# Patient Record
Sex: Female | Born: 1965 | Race: White | Hispanic: No | Marital: Married | Smoking: Former smoker
Health system: Southern US, Community
[De-identification: ages and names within clinical notes are randomized; demographics above are authoritative.]

## PROBLEM LIST (undated history)

## (undated) DIAGNOSIS — G5600 Carpal tunnel syndrome, unspecified upper limb: Secondary | ICD-10-CM

## (undated) DIAGNOSIS — J449 Chronic obstructive pulmonary disease, unspecified: Secondary | ICD-10-CM

## (undated) DIAGNOSIS — J45909 Unspecified asthma, uncomplicated: Secondary | ICD-10-CM

## (undated) DIAGNOSIS — Z9981 Dependence on supplemental oxygen: Secondary | ICD-10-CM

## (undated) DIAGNOSIS — F32A Depression, unspecified: Secondary | ICD-10-CM

## (undated) DIAGNOSIS — K5792 Diverticulitis of intestine, part unspecified, without perforation or abscess without bleeding: Secondary | ICD-10-CM

## (undated) DIAGNOSIS — R0902 Hypoxemia: Secondary | ICD-10-CM

## (undated) DIAGNOSIS — M1990 Unspecified osteoarthritis, unspecified site: Secondary | ICD-10-CM

## (undated) DIAGNOSIS — F419 Anxiety disorder, unspecified: Secondary | ICD-10-CM

## (undated) DIAGNOSIS — H269 Unspecified cataract: Secondary | ICD-10-CM

## (undated) DIAGNOSIS — J439 Emphysema, unspecified: Secondary | ICD-10-CM

## (undated) DIAGNOSIS — F329 Major depressive disorder, single episode, unspecified: Secondary | ICD-10-CM

## (undated) DIAGNOSIS — E785 Hyperlipidemia, unspecified: Secondary | ICD-10-CM

## (undated) DIAGNOSIS — T7840XA Allergy, unspecified, initial encounter: Secondary | ICD-10-CM

## (undated) HISTORY — DX: Unspecified osteoarthritis, unspecified site: M19.90

## (undated) HISTORY — PX: ARTHROSCOPIC REPAIR ACL: SUR80

## (undated) HISTORY — DX: Anxiety disorder, unspecified: F41.9

## (undated) HISTORY — DX: Hypoxemia: R09.02

## (undated) HISTORY — DX: Depression, unspecified: F32.A

## (undated) HISTORY — DX: Unspecified cataract: H26.9

## (undated) HISTORY — PX: APPENDECTOMY: SHX54

## (undated) HISTORY — DX: Allergy, unspecified, initial encounter: T78.40XA

## (undated) HISTORY — DX: Emphysema, unspecified: J43.9

## (undated) HISTORY — DX: Hyperlipidemia, unspecified: E78.5

## (undated) HISTORY — PX: DILATION AND CURETTAGE OF UTERUS: SHX78

---
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
## 1992-05-14 HISTORY — PX: CHOLECYSTECTOMY: SHX55

## 1998-03-20 ENCOUNTER — Emergency Department (HOSPITAL_COMMUNITY): Admission: EM | Admit: 1998-03-20 | Discharge: 1998-03-20 | Payer: Self-pay | Admitting: *Deleted

## 1998-09-16 ENCOUNTER — Encounter: Payer: Self-pay | Admitting: Family Medicine

## 1998-09-16 ENCOUNTER — Ambulatory Visit (HOSPITAL_COMMUNITY): Admission: RE | Admit: 1998-09-16 | Discharge: 1998-09-16 | Payer: Self-pay | Admitting: Family Medicine

## 1998-12-08 ENCOUNTER — Other Ambulatory Visit: Admission: RE | Admit: 1998-12-08 | Discharge: 1998-12-08 | Payer: Self-pay | Admitting: *Deleted

## 2000-09-12 ENCOUNTER — Other Ambulatory Visit: Admission: RE | Admit: 2000-09-12 | Discharge: 2000-09-12 | Payer: Self-pay | Admitting: Family Medicine

## 2001-05-16 ENCOUNTER — Encounter: Admission: RE | Admit: 2001-05-16 | Discharge: 2001-05-16 | Payer: Self-pay | Admitting: Family Medicine

## 2001-05-16 ENCOUNTER — Encounter: Payer: Self-pay | Admitting: Family Medicine

## 2001-11-08 ENCOUNTER — Encounter: Payer: Self-pay | Admitting: Internal Medicine

## 2001-11-08 ENCOUNTER — Encounter: Admission: RE | Admit: 2001-11-08 | Discharge: 2001-11-08 | Payer: Self-pay | Admitting: Internal Medicine

## 2002-05-14 HISTORY — PX: TUBAL LIGATION: SHX77

## 2002-08-24 ENCOUNTER — Ambulatory Visit (HOSPITAL_COMMUNITY): Admission: RE | Admit: 2002-08-24 | Discharge: 2002-08-24 | Payer: Self-pay | Admitting: Obstetrics and Gynecology

## 2002-08-24 ENCOUNTER — Encounter: Payer: Self-pay | Admitting: Obstetrics and Gynecology

## 2002-09-02 ENCOUNTER — Emergency Department (HOSPITAL_COMMUNITY): Admission: EM | Admit: 2002-09-02 | Discharge: 2002-09-02 | Payer: Self-pay | Admitting: Emergency Medicine

## 2002-09-10 ENCOUNTER — Ambulatory Visit (HOSPITAL_COMMUNITY): Admission: RE | Admit: 2002-09-10 | Discharge: 2002-09-10 | Payer: Self-pay | Admitting: Obstetrics and Gynecology

## 2004-05-14 HISTORY — PX: BUNIONECTOMY: SHX129

## 2005-01-30 ENCOUNTER — Other Ambulatory Visit: Admission: RE | Admit: 2005-01-30 | Discharge: 2005-01-30 | Payer: Self-pay | Admitting: Family Medicine

## 2005-02-20 ENCOUNTER — Encounter: Admission: RE | Admit: 2005-02-20 | Discharge: 2005-02-20 | Payer: Self-pay | Admitting: Family Medicine

## 2005-09-29 ENCOUNTER — Emergency Department (HOSPITAL_COMMUNITY): Admission: EM | Admit: 2005-09-29 | Discharge: 2005-09-29 | Payer: Self-pay | Admitting: Family Medicine

## 2005-10-01 ENCOUNTER — Emergency Department (HOSPITAL_COMMUNITY): Admission: EM | Admit: 2005-10-01 | Discharge: 2005-10-01 | Payer: Self-pay | Admitting: Emergency Medicine

## 2006-03-14 ENCOUNTER — Encounter: Admission: RE | Admit: 2006-03-14 | Discharge: 2006-03-14 | Payer: Self-pay | Admitting: Family Medicine

## 2006-03-27 ENCOUNTER — Encounter: Admission: RE | Admit: 2006-03-27 | Discharge: 2006-03-27 | Payer: Self-pay | Admitting: Family Medicine

## 2006-05-26 ENCOUNTER — Emergency Department (HOSPITAL_COMMUNITY): Admission: EM | Admit: 2006-05-26 | Discharge: 2006-05-26 | Payer: Self-pay | Admitting: Emergency Medicine

## 2006-10-21 ENCOUNTER — Emergency Department (HOSPITAL_COMMUNITY): Admission: EM | Admit: 2006-10-21 | Discharge: 2006-10-21 | Payer: Self-pay | Admitting: Emergency Medicine

## 2007-01-29 ENCOUNTER — Emergency Department (HOSPITAL_COMMUNITY): Admission: EM | Admit: 2007-01-29 | Discharge: 2007-01-29 | Payer: Self-pay | Admitting: Emergency Medicine

## 2007-04-11 ENCOUNTER — Encounter: Admission: RE | Admit: 2007-04-11 | Discharge: 2007-04-11 | Payer: Self-pay | Admitting: Family Medicine

## 2007-05-15 HISTORY — PX: ABDOMINAL HYSTERECTOMY: SHX81

## 2007-09-08 ENCOUNTER — Other Ambulatory Visit: Admission: RE | Admit: 2007-09-08 | Discharge: 2007-09-08 | Payer: Self-pay | Admitting: Family Medicine

## 2008-04-19 ENCOUNTER — Encounter: Admission: RE | Admit: 2008-04-19 | Discharge: 2008-04-19 | Payer: Self-pay | Admitting: Family Medicine

## 2008-08-06 ENCOUNTER — Encounter (INDEPENDENT_AMBULATORY_CARE_PROVIDER_SITE_OTHER): Payer: Self-pay | Admitting: Obstetrics and Gynecology

## 2008-08-06 ENCOUNTER — Ambulatory Visit (HOSPITAL_COMMUNITY): Admission: RE | Admit: 2008-08-06 | Discharge: 2008-08-07 | Payer: Self-pay | Admitting: Obstetrics and Gynecology

## 2009-05-09 ENCOUNTER — Encounter: Admission: RE | Admit: 2009-05-09 | Discharge: 2009-05-09 | Payer: Self-pay | Admitting: Family Medicine

## 2009-05-14 HISTORY — PX: ARTHROSCOPIC REPAIR ACL: SUR80

## 2009-05-17 ENCOUNTER — Encounter: Admission: RE | Admit: 2009-05-17 | Discharge: 2009-05-17 | Payer: Self-pay | Admitting: Family Medicine

## 2009-08-30 ENCOUNTER — Ambulatory Visit (HOSPITAL_BASED_OUTPATIENT_CLINIC_OR_DEPARTMENT_OTHER): Admission: RE | Admit: 2009-08-30 | Discharge: 2009-08-30 | Payer: Self-pay | Admitting: Orthopedic Surgery

## 2010-06-01 ENCOUNTER — Encounter
Admission: RE | Admit: 2010-06-01 | Discharge: 2010-06-01 | Payer: Self-pay | Source: Home / Self Care | Attending: Family Medicine | Admitting: Family Medicine

## 2010-06-03 ENCOUNTER — Encounter: Payer: Self-pay | Admitting: Family Medicine

## 2010-06-04 ENCOUNTER — Encounter: Payer: Self-pay | Admitting: Family Medicine

## 2010-08-24 LAB — CBC
HCT: 40.2 % (ref 36.0–46.0)
Hemoglobin: 12.3 g/dL (ref 12.0–15.0)
Hemoglobin: 13.7 g/dL (ref 12.0–15.0)
MCHC: 34.1 g/dL (ref 30.0–36.0)
MCV: 96.7 fL (ref 78.0–100.0)
MCV: 97.6 fL (ref 78.0–100.0)
Platelets: 271 10*3/uL (ref 150–400)
RDW: 12.4 % (ref 11.5–15.5)
RDW: 12.6 % (ref 11.5–15.5)

## 2010-08-24 LAB — BASIC METABOLIC PANEL
BUN: 9 mg/dL (ref 6–23)
Chloride: 98 mEq/L (ref 96–112)
Glucose, Bld: 87 mg/dL (ref 70–99)
Potassium: 3.5 mEq/L (ref 3.5–5.1)

## 2010-09-26 NOTE — Op Note (Signed)
NAME:  Vicki [name], Vicki [name]           ACCOUNT NO.:  [id number]   MEDICAL RECORD NO.:  [id number]          PATIENT TYPE:  AMB   LOCATION:  SDC                           FACILITY:  [facility]   PHYSICIAN:  Zenaida Niece, M.D.DATE OF BIRTH:  November 29, 1965   DATE OF PROCEDURE:  08/06/2008  DATE OF DISCHARGE:                               OPERATIVE REPORT   PREOPERATIVE DIAGNOSES:  Chronic pelvic pain and abnormal uterine  bleeding.   POSTOPERATIVE DIAGNOSES:  Chronic pelvic pain and abnormal uterine  bleeding.   PROCEDURE:  Laparoscopic-assisted vaginal hysterectomy and cystoscopy.   SURGEON:  Zenaida Niece, MD   ASSISTANT:  Malachi Pro. Ambrose Mantle, MD   ANESTHESIA:  General endotracheal tube.   FINDINGS:  She had normal pelvic anatomy with evidence of prior tubal  ligation and normal abdomen.   SPECIMENS:  Uterus with cervix sent for routine pathology.   ESTIMATED BLOOD LOSS:  300 mL.   COMPLICATIONS:  None.   PROCEDURE IN DETAIL:  The patient was taken to the operating room and  placed in the dorsal supine position.  General anesthesia was induced.  Both arms were tucked to her sides and legs were placed in mobile  stirrups.  Abdomen, perineum, and vagina were then prepped and draped in  the usual sterile fashion, bladder drained with a red Robinson catheter.  Infraumbilical skin was then infiltrated with 0.25% Marcaine and a 0.75  cm vertical incision was made.  The Veress needle was inserted into the  peritoneal cavity and placement confirmed by the water drop test.  Initially, I had difficulty obtaining a good pressure with CO2, but  eventually I was able to get a good opening pressure of 6 mmHg.  CO2 gas  was insufflated to a pressure of 12 mmHg and the Veress needle was  removed.  A 5-mm trocar was then introduced with direct visualization  with the laparoscope.  The 5-mm ports were then placed on each side also  under direct visualization.  Inspection of the abdomen revealed a  normal  upper abdomen, liver edge, and appendix.  Pelvis had normal anatomy.  Uterus was mid planar to retroverted normal size.  Tubes and ovaries  were normal except for evidence of prior tubal ligation.  The uterine  fundus was grasped with a 5-mm tenaculum.  Using the harmonic scalpel  Ace first on the left side, the proximal fallopian tube, utero-ovarian  pedicle, and broad ligament were taken down.  Once I got close to the  uterine artery, there was bleeding and this was eventually controlled  with the harmonic scalpel with some difficulty.  The anterior peritoneum  was then incised across the anterior portion uterus to push the bladder  inferior.  Sides were switched.  The harmonic scalpel Ace was used on  the right side similarly to take down the mesosalpinx, round ligament  utero-ovarian ligament, and broad ligament.  Anterior peritoneum was  incised across the anterior portion of the uterus to connect the  incision coming from the left side and the bladder was pushed inferior.  The uterine artery was left intact.  At this point,  I elected to proceed  vaginally.   Legs were elevated in the stirrups.  A weighted speculum was inserted  into the vagina.  The cervix was grasped with Christella Hartigan tenaculums.  The  cervicovaginal mucosa was then infiltrated with a dilute solution of  Pitressin and incised circumferentially with electrocautery.  Sharp  dissection was then used to further free the vagina from the cervix.  Anteriorly, I was able to enter the anterior peritoneum and place a  Deaver retractor to retract the bladder anterior.  Posterior cul-de-sac  was entered and a Bonnano speculum placed in the posterior cul-de-sac.  Uterosacral ligaments were then clamped, transected, and ligated with #1  chromic and tagged for later use.  Uterine arteries and cardinal  ligaments were clamped, transected, and ligated with #1 chromic.  This  freed up the left side of the uterus.  One further  bite on the right  side of mostly broad ligament, freed the uterus on this side.  This  pedicle was secured with #1 chromic.  No significant bleeding was noted.  The Bonnano speculum was removed and a shorter speculum was placed.  The  uterosacral ligaments were plicated in midline with 2-0 silk.  The  previously tagged uterosacral pedicles were also tied in the midline.  Vagina was then closed in a vertical fashion with running locking 2-0  Vicryl with adequate closure and adequate hemostasis.   The patient was given indigo carmine IV.  A 70-degree cystoscope was  inserted and good visualization was achieved with approximately 200 mL  of sterile fluid.  The bladder appeared normal.  Both ureteral orifices  were easily identified and had good efflux of blue-tinged urine.  The  cystoscope was removed and a Foley catheter was placed.  The legs were  then lowered in the stirrups.   Dr. Ambrose Mantle, myself, and the scrub techs changed gloves.  The abdomen was  reinsufflated with CO2 gas and the laparoscope was reinserted.  The  pelvis was inspected and irrigated and no significant bleeding was  noted.  All pedicles were hemostatic.  All trocars were removed with  direct visualization.  All gas was allowed to deflate from the abdomen.  Skin incisions were closed with interrupted subcuticular sutures of 4-0  Vicryl followed by Dermabond.  The patient tolerated the procedure well.  She was extubated in the operating room and taken to the recovery room  in stable condition.  Counts were correct x2, she received clindamycin  and gentamicin at the beginning of the procedure due to a PENICILLIN  allergy and had PAS hose on throughout the procedure.      Zenaida Niece, M.D.  Electronically Signed     TDM/MEDQ  D:  08/06/2008  T:  08/07/2008  Job:  [id number]

## 2010-09-26 NOTE — H&P (Signed)
NAME:  Vicki [name], Vicki [name]           ACCOUNT NO.:  [id number]   MEDICAL RECORD NO.:  [id number]          PATIENT TYPE:  AMB   LOCATION:  SDC                           FACILITY:  [facility]   PHYSICIAN:  Zenaida Niece, M.D.DATE OF BIRTH:  02/05/1966   DATE OF ADMISSION:  08/06/2008  DATE OF DISCHARGE:                              HISTORY & PHYSICAL   CHIEF COMPLAINT:  Chronic pelvic pain and abnormal uterine bleeding.   HISTORY OF PRESENT ILLNESS:  This is a 45 year old female, para 3-1-0-3,  whom I saw for an annual exam in March of this year.  At that time she  was complaining of irregular periods every 2-4 weeks with intermittent  pelvic pain for up to a week.  She also complains of occasional pain  with intercourse.  Physical exam revealed a benign abdomen except she  was tender in both lower quadrants.  Pelvic exam revealed a mid planar  uterus that was normal size and slightly tender.  She was tender in the  vaginal fornices and tender in bilateral adnexa.  All nonsurgical and  surgical options were discussed with the patient regarding her pelvic  pain and her abnormal uterine bleeding.  The patient wishes to proceed  with definitive surgical therapy and is admitted for laparoscopic-  assisted vaginal hysterectomy at this time.   PAST OB HISTORY:  Three vaginal deliveries at term and one intrauterine  fetal demise at 25 weeks.   PAST MEDICAL HISTORY:  COPD.   PAST SURGICAL HISTORY:  1. Cholecystectomy.  2. She has had two laparoscopic tubal fulgurations, the first one      1998.  She then had a positive pregnancy test in 2004 and in 2004      underwent a repeat laparoscopy with tubal fulguration.  3. Bunion surgery x2.   ALLERGIES:  PENICILLIN and CEPHALOSPORINS, possibly to  ALEVE  and does  not tolerate DEPO-PROVERA. Marland Kitchen   CURRENT MEDICATIONS:  Advair and Xanax b.i.d.   SOCIAL HISTORY:  The patient is married and does smoke a pack of  cigarettes a day.  She denies alcohol  or drug use.   FAMILY HISTORY:  The patient has a sister with breast cancer.   GYN HISTORY:  No history of abnormal Pap smears.   REVIEW OF SYSTEMS:  Again some urinary leaking with straining and some  loose stools.   PHYSICAL EXAMINATION:  GENERAL:  This is a well-developed female in no  acute distress.  VITAL SIGNS:  Weight is 157 pounds, blood pressure is 140/90.  NECK:  Supple without lymphadenopathy or thyromegaly.  LUNGS:  Clear to auscultation although decreased breath sounds  bilaterally.  HEART:  Regular rate and rhythm without murmur.  ABDOMEN:  Soft, nondistended without palpable mass and she is slightly  tender in both lower quadrants.  EXTREMITIES:  No edema and are  nontender.  PELVIC:  On pelvic exam, external genitalia has no lesions.  On speculum  exam, the cervix is normal.  On bimanual exam she has a mid planar to  anteverted, slightly tender uterus.  Vaginal fornices are tender and she  is tender  on in both adnexa without mass.  This is exam is confirmed by  rectovaginal exam.   ASSESSMENT:  Pelvic pain and abnormal uterine bleeding.  All nonsurgical  and surgical options were discussed with the patient.  She made a  specific appointment with her husband to review these options.  They  have elected to proceed with definitive surgical therapy.  Again all  surgical options and all risks of surgery have been discussed.  Plan is  to admit the patient on the day of surgery for laparoscopic-assisted  vaginal hysterectomy.  We will preserve her ovaries unless they look  abnormal.      Zenaida Niece, M.D.  Electronically Signed     TDM/MEDQ  D:  08/05/2008  T:  08/05/2008  Job:  [id number]

## 2010-09-29 NOTE — Op Note (Signed)
NAME:  Vicki [name], Vicki [name]                     ACCOUNT NO.:  [id number]   MEDICAL RECORD NO.:  [id number]                   PATIENT TYPE:  AMB   LOCATION:  DAY                                  FACILITY:  [facility]   PHYSICIAN:  Zenaida Niece, M.D.             DATE OF BIRTH:  08-27-1965   DATE OF PROCEDURE:  09/10/2002  DATE OF DISCHARGE:                                 OPERATIVE REPORT   PREOPERATIVE DIAGNOSES:  Desires surgical sterility and prior tubal  ligation.   POSTOPERATIVE DIAGNOSES:  Desires surgical sterility and prior tubal  ligation.   PROCEDURE:  Laparoscopic bilateral tubal fulguration.   SURGEON:  Zenaida Niece, M.D.   ANESTHESIA:  General endotracheal tube.   ESTIMATED BLOOD LOSS:  Less than 50 mL.   FINDINGS:  Normal abdominal and pelvic anatomy except for the prior tubal  ligation. The right tube was kind of curled up around itself and the two  ends were in fairly close proximity. The left tube appeared to be adequately  separated.   DESCRIPTION OF PROCEDURE:  The patient was taken to the operating room and  placed in the dorsal supine position. General anesthesia was induced and she  was placed in mobile stirrups. The abdomen was prepped and draped in the  usual sterile fashion, bladder drained with a red rubber catheter, Hulka  tenaculum applied to her cervix for uterine manipulation. Infraumbilical  skin was infiltrated with 0.25% Marcaine. A 1 1/2 cm vertical incision was  made in her previous scar and the Veress needle was inserted into the  peritoneal cavity. This was confirmed by the water drop test and an opening  pressure of 1 mmHg. The abdomen was then insufflated to a pressure of 12  mmHg and the Veress needle was removed. The 10/11 disposable trocar was then  introduced and placement confirmed by the laparoscope. Again inspection  revealed a normal uterus. The right tube did show evidence of prior tubal  ligation. However, the proximal  portion seen to curl around itself and come  into a fairly proximate position to the distal segment of the tube. Several  segments of each end of the tube were fulgurated with bipolar cautery to  achieve sterilization. This was done with adequate hemostasis. The left tube  appeared to be separated by a good 1-2 cm. However, both the proximal and  distal ends of the tube were fulgurated with bipolar cautery to help usher  sterilization. There did appear to be a spot of endometriosis at the  proximal portion of the left tube. This was fulgurated with the tube. All  sites were hemostatic. The laparoscope was removed and all gas to deflate  from the abdomen. The disposable trocar was removed. The skin incision was  closed interrupted subcuticular sutures of 4-0 Vicryl followed by Steri-  Strips and band aids. The patient was extubated in the operating room,  tolerated the procedure well and was  taken to the recovery room in stable  condition. Counts were correct and she was given no antibiotics.                                               Zenaida Niece, M.D.    TDM/MEDQ  D:  09/10/2002  T:  09/10/2002  Job:  [id number]

## 2011-06-18 ENCOUNTER — Other Ambulatory Visit: Payer: Self-pay | Admitting: Family Medicine

## 2011-06-18 DIAGNOSIS — Z1231 Encounter for screening mammogram for malignant neoplasm of breast: Secondary | ICD-10-CM

## 2011-07-02 ENCOUNTER — Ambulatory Visit: Payer: Self-pay

## 2011-07-04 ENCOUNTER — Ambulatory Visit
Admission: RE | Admit: 2011-07-04 | Discharge: 2011-07-04 | Disposition: A | Discharge status: Home / Self Care | Payer: BC Managed Care – PPO | Source: Ambulatory Visit | Attending: Family Medicine | Admitting: Family Medicine

## 2011-07-04 DIAGNOSIS — Z1231 Encounter for screening mammogram for malignant neoplasm of breast: Secondary | ICD-10-CM

## 2012-06-03 ENCOUNTER — Other Ambulatory Visit: Payer: Self-pay | Admitting: Family Medicine

## 2012-06-03 DIAGNOSIS — Z803 Family history of malignant neoplasm of breast: Secondary | ICD-10-CM

## 2012-06-03 DIAGNOSIS — Z1231 Encounter for screening mammogram for malignant neoplasm of breast: Secondary | ICD-10-CM

## 2012-07-04 ENCOUNTER — Ambulatory Visit: Payer: BC Managed Care – PPO

## 2012-07-07 ENCOUNTER — Ambulatory Visit
Admission: RE | Admit: 2012-07-07 | Discharge: 2012-07-07 | Disposition: A | Discharge status: Home / Self Care | Payer: BC Managed Care – PPO | Source: Ambulatory Visit | Attending: Family Medicine | Admitting: Family Medicine

## 2012-07-07 DIAGNOSIS — Z803 Family history of malignant neoplasm of breast: Secondary | ICD-10-CM

## 2013-02-12 ENCOUNTER — Emergency Department (INDEPENDENT_AMBULATORY_CARE_PROVIDER_SITE_OTHER): Payer: BC Managed Care – PPO

## 2013-02-12 ENCOUNTER — Encounter (HOSPITAL_COMMUNITY): Payer: Self-pay | Admitting: Emergency Medicine

## 2013-02-12 ENCOUNTER — Emergency Department (HOSPITAL_COMMUNITY)
Admission: EM | Admit: 2013-02-12 | Discharge: 2013-02-12 | Disposition: A | Discharge status: Home / Self Care | Payer: BC Managed Care – PPO | Source: Home / Self Care | Attending: Emergency Medicine | Admitting: Emergency Medicine

## 2013-02-12 DIAGNOSIS — S2239XA Fracture of one rib, unspecified side, initial encounter for closed fracture: Secondary | ICD-10-CM

## 2013-02-12 DIAGNOSIS — S2232XA Fracture of one rib, left side, initial encounter for closed fracture: Secondary | ICD-10-CM

## 2013-02-12 HISTORY — DX: Chronic obstructive pulmonary disease, unspecified: J44.9

## 2013-02-12 MED ORDER — HYDROCODONE-ACETAMINOPHEN 5-325 MG PO TABS: 1.0000 | ORAL_TABLET | ORAL | Status: DC | PRN

## 2013-02-12 NOTE — ED Provider Notes (Signed)
Medical screening examination/treatment/procedure(s) were performed by non-physician practitioner and as supervising physician I was immediately available for consultation/collaboration.  Leslee Home, M.D.  Reuben Likes, MD 02/12/13 [phone]

## 2013-02-12 NOTE — ED Notes (Signed)
Left rib pain onset Sunday.  Reports her husband was trying to pop her back, while lying on the floor, her husband stood on her back and felt pop "explosion" in left chest.

## 2013-02-12 NOTE — ED Provider Notes (Signed)
CSN: [id number]     Arrival date & time 02/12/13  0802 History   None    Chief Complaint  Patient presents with  . Chest Pain   (Consider location/radiation/quality/duration/timing/severity/associated sxs/prior Treatment) Patient is a 47 y.o. female presenting with chest pain. The history is provided by the patient.  Chest Pain Pain location:  L chest Pain quality: aching   Pain radiates to:  Does not radiate Pain radiates to the back: no   Pain severity:  Severe Onset quality:  Sudden Duration:  4 days Timing:  Constant Progression:  Unchanged Chronicity:  New Context: trauma   Context comment:  Pt was lying on floor, husband was standing on back trying to crack her back when pt felt an "explosion" in L ribs and heard a pop Relieved by:  Nothing Worsened by:  Nothing tried Ineffective treatments: ibuprofen. Associated symptoms: no cough, no diaphoresis, no fever, no palpitations and no shortness of breath   Risk factors: smoking     Past Medical History  Diagnosis Date  . COPD (chronic obstructive pulmonary disease)    Past Surgical History  Procedure Laterality Date  . Abdominal hysterectomy    . Bunionectomy    . Arthroscopic repair acl Left     x 2   History reviewed. No pertinent family history. History  Substance Use Topics  . Smoking status: Current Every Day Smoker  . Smokeless tobacco: Not on file  . Alcohol Use: No   OB History   Grav Para Term Preterm Abortions TAB SAB Ect Mult Living                 Review of Systems  Constitutional: Negative for fever and diaphoresis.  Respiratory: Negative for cough and shortness of breath.   Cardiovascular: Positive for chest pain. Negative for palpitations.  Musculoskeletal:       L anterior rib pain    Allergies  Aleve; Benadryl; Ceftin; Depo-provera; Jasmine oil; Naproxen; Oxycodone; Penicillins; and Tramadol  Home Medications   Current Outpatient Rx  Name  Route  Sig  Dispense  Refill  . ALPRAZolam  (XANAX) 1 MG tablet   Oral   Take 1 mg by mouth at bedtime as needed for sleep.         Marland Kitchen aspirin 81 MG tablet   Oral   Take 81 mg by mouth daily.         . Fluticasone-Salmeterol (ADVAIR HFA IN)   Inhalation   Inhale into the lungs.         Marland Kitchen HYDROcodone-acetaminophen (NORCO/VICODIN) 5-325 MG per tablet   Oral   Take 1-2 tablets by mouth every 4 (four) hours as needed for pain.   30 tablet   0    BP 124/86  Pulse 92  Temp(Src) 97.7 F (36.5 C) (Oral)  Resp 16  SpO2 99% Physical Exam  Constitutional: She appears well-developed and well-nourished.  Appears in pain  Cardiovascular: Normal rate and regular rhythm.   Pulmonary/Chest: Effort normal and breath sounds normal. She exhibits tenderness and bony tenderness.      ED Course  Procedures (including critical care time) Labs Review Labs Reviewed - No data to display Imaging Review Dg Ribs Unilateral W/chest Left  02/12/2013   CLINICAL DATA:  Pain  EXAM: LEFT RIBS AND CHEST - 3+ VIEW  COMPARISON:  Chest radiograph October 21, 2006  FINDINGS: Frontal chest as well as bilateral oblique views were obtained. Lungs are clear although mildly hyperexpanded. Heart size and pulmonary  vascularity are normal. There is no demonstrable effusion or pneumothorax.  There are acute appearing fractures of the anterior left 6th and 7th ribs which are nondisplaced. No other acute fractures are identified. There is an old healed fracture of the posterior right 6th rib.  IMPRESSION: The lungs are hyperexpanded but clear. There are fractures of the anterior left 6th and 7th ribs, nondisplaced. Old fracture of the right posterior 6th rib noted. No pneumothorax.   Electronically Signed   By: Bretta Bang   On: 02/12/2013 09:53    MDM   1. Rib fractures, left, closed, initial encounter    Rx hydrocodone/apap 5/325 1-2 po q4 hours prn pain #30. Reviewed importance of deep breathing, use of splinting. Continue ibuprofen at home.      Cathlyn Parsons, NP 02/12/13 1020

## 2013-06-19 ENCOUNTER — Other Ambulatory Visit: Payer: Self-pay

## 2013-06-19 DIAGNOSIS — Z1231 Encounter for screening mammogram for malignant neoplasm of breast: Secondary | ICD-10-CM

## 2013-06-19 DIAGNOSIS — Z803 Family history of malignant neoplasm of breast: Secondary | ICD-10-CM

## 2013-07-13 ENCOUNTER — Ambulatory Visit
Admission: RE | Admit: 2013-07-13 | Discharge: 2013-07-13 | Disposition: A | Discharge status: Home / Self Care | Payer: Self-pay | Source: Ambulatory Visit

## 2013-07-13 DIAGNOSIS — Z1231 Encounter for screening mammogram for malignant neoplasm of breast: Secondary | ICD-10-CM

## 2013-07-13 DIAGNOSIS — Z803 Family history of malignant neoplasm of breast: Secondary | ICD-10-CM

## 2013-10-16 ENCOUNTER — Other Ambulatory Visit: Payer: Self-pay | Admitting: Orthopedic Surgery

## 2013-10-26 ENCOUNTER — Encounter (HOSPITAL_BASED_OUTPATIENT_CLINIC_OR_DEPARTMENT_OTHER): Payer: Self-pay | Admitting: *Deleted

## 2013-10-26 NOTE — Progress Notes (Signed)
No labs needed

## 2013-10-28 ENCOUNTER — Encounter (HOSPITAL_BASED_OUTPATIENT_CLINIC_OR_DEPARTMENT_OTHER): Payer: BC Managed Care – PPO | Admitting: Anesthesiology

## 2013-10-28 ENCOUNTER — Ambulatory Visit (HOSPITAL_BASED_OUTPATIENT_CLINIC_OR_DEPARTMENT_OTHER): Payer: BC Managed Care – PPO | Admitting: Anesthesiology

## 2013-10-28 ENCOUNTER — Encounter (HOSPITAL_BASED_OUTPATIENT_CLINIC_OR_DEPARTMENT_OTHER)
Admission: RE | Disposition: A | Discharge status: Home / Self Care | Payer: Self-pay | Source: Ambulatory Visit | Attending: Orthopedic Surgery

## 2013-10-28 ENCOUNTER — Ambulatory Visit (HOSPITAL_BASED_OUTPATIENT_CLINIC_OR_DEPARTMENT_OTHER)
Admission: RE | Admit: 2013-10-28 | Discharge: 2013-10-28 | Disposition: A | Discharge status: Home / Self Care | Payer: BC Managed Care – PPO | Source: Ambulatory Visit | Attending: Orthopedic Surgery | Admitting: Orthopedic Surgery

## 2013-10-28 ENCOUNTER — Encounter (HOSPITAL_BASED_OUTPATIENT_CLINIC_OR_DEPARTMENT_OTHER): Payer: Self-pay | Admitting: Orthopedic Surgery

## 2013-10-28 DIAGNOSIS — J449 Chronic obstructive pulmonary disease, unspecified: Secondary | ICD-10-CM | POA: Insufficient documentation

## 2013-10-28 DIAGNOSIS — Z7982 Long term (current) use of aspirin: Secondary | ICD-10-CM | POA: Insufficient documentation

## 2013-10-28 DIAGNOSIS — Z880 Allergy status to penicillin: Secondary | ICD-10-CM | POA: Insufficient documentation

## 2013-10-28 DIAGNOSIS — G5600 Carpal tunnel syndrome, unspecified upper limb: Secondary | ICD-10-CM | POA: Insufficient documentation

## 2013-10-28 DIAGNOSIS — F329 Major depressive disorder, single episode, unspecified: Secondary | ICD-10-CM | POA: Insufficient documentation

## 2013-10-28 DIAGNOSIS — Z885 Allergy status to narcotic agent status: Secondary | ICD-10-CM | POA: Insufficient documentation

## 2013-10-28 DIAGNOSIS — R450 Nervousness: Secondary | ICD-10-CM | POA: Insufficient documentation

## 2013-10-28 DIAGNOSIS — F17200 Nicotine dependence, unspecified, uncomplicated: Secondary | ICD-10-CM | POA: Insufficient documentation

## 2013-10-28 DIAGNOSIS — J4489 Other specified chronic obstructive pulmonary disease: Secondary | ICD-10-CM | POA: Insufficient documentation

## 2013-10-28 DIAGNOSIS — F3289 Other specified depressive episodes: Secondary | ICD-10-CM | POA: Insufficient documentation

## 2013-10-28 HISTORY — DX: Carpal tunnel syndrome, unspecified upper limb: G56.00

## 2013-10-28 HISTORY — PX: CARPAL TUNNEL RELEASE: SHX101

## 2013-10-28 LAB — POCT HEMOGLOBIN-HEMACUE: Hemoglobin: 15.6 g/dL — ABNORMAL HIGH (ref 12.0–15.0)

## 2013-10-28 SURGERY — CARPAL TUNNEL RELEASE
Anesthesia: General | Site: Wrist | Laterality: Bilateral

## 2013-10-28 MED ORDER — SCOPOLAMINE 1 MG/3DAYS TD PT72: 1.0000 | MEDICATED_PATCH | TRANSDERMAL | Status: DC

## 2013-10-28 MED ORDER — FENTANYL CITRATE 0.05 MG/ML IJ SOLN: 50.0000 ug | INTRAMUSCULAR | Status: DC | PRN

## 2013-10-28 MED ORDER — MIDAZOLAM HCL 2 MG/2ML IJ SOLN: 1.0000 mg | INTRAMUSCULAR | Status: DC | PRN

## 2013-10-28 MED ORDER — LACTATED RINGERS IV SOLN
INTRAVENOUS | Status: DC
  Administered 2013-10-28: 09:00:00 via INTRAVENOUS
  Administered 2013-10-28: 10 mL/h via INTRAVENOUS
  Administered 2013-10-28: 10:00:00 via INTRAVENOUS

## 2013-10-28 MED ORDER — BUPIVACAINE HCL (PF) 0.25 % IJ SOLN
INTRAMUSCULAR | Status: AC
  Filled 2013-10-28: qty 30

## 2013-10-28 MED ORDER — ALBUTEROL SULFATE (2.5 MG/3ML) 0.083% IN NEBU
INHALATION_SOLUTION | RESPIRATORY_TRACT | Status: AC
  Filled 2013-10-28: qty 3

## 2013-10-28 MED ORDER — CHLORHEXIDINE GLUCONATE 4 % EX LIQD: 60.0000 mL | Freq: Once | CUTANEOUS | Status: DC

## 2013-10-28 MED ORDER — MIDAZOLAM HCL 5 MG/5ML IJ SOLN
INTRAMUSCULAR | Status: DC | PRN
  Administered 2013-10-28: 2 mg via INTRAVENOUS

## 2013-10-28 MED ORDER — DEXAMETHASONE SODIUM PHOSPHATE 10 MG/ML IJ SOLN
INTRAMUSCULAR | Status: DC | PRN
  Administered 2013-10-28: 10 mg via INTRAVENOUS

## 2013-10-28 MED ORDER — HYDROMORPHONE HCL PF 1 MG/ML IJ SOLN
INTRAMUSCULAR | Status: AC
  Filled 2013-10-28: qty 1

## 2013-10-28 MED ORDER — HYDROMORPHONE HCL PF 1 MG/ML IJ SOLN
0.2500 mg | INTRAMUSCULAR | Status: DC | PRN
  Administered 2013-10-28 (×2): 0.5 mg via INTRAVENOUS

## 2013-10-28 MED ORDER — MIDAZOLAM HCL 2 MG/2ML IJ SOLN
INTRAMUSCULAR | Status: AC
  Filled 2013-10-28: qty 2

## 2013-10-28 MED ORDER — BUPIVACAINE HCL (PF) 0.25 % IJ SOLN
INTRAMUSCULAR | Status: DC | PRN
  Administered 2013-10-28: 9 mL

## 2013-10-28 MED ORDER — LIDOCAINE HCL (CARDIAC) 20 MG/ML IV SOLN
INTRAVENOUS | Status: DC | PRN
  Administered 2013-10-28: 100 mg via INTRAVENOUS

## 2013-10-28 MED ORDER — PROPOFOL 10 MG/ML IV BOLUS
INTRAVENOUS | Status: DC | PRN
  Administered 2013-10-28: 100 mg via INTRAVENOUS
  Administered 2013-10-28: 250 mg via INTRAVENOUS

## 2013-10-28 MED ORDER — VANCOMYCIN HCL IN DEXTROSE 1-5 GM/200ML-% IV SOLN: 1000.0000 mg | INTRAVENOUS | Status: DC

## 2013-10-28 MED ORDER — OXYCODONE HCL 5 MG PO TABS: 5.0000 mg | ORAL_TABLET | Freq: Once | ORAL | Status: DC | PRN

## 2013-10-28 MED ORDER — OXYCODONE HCL 5 MG/5ML PO SOLN: 5.0000 mg | Freq: Once | ORAL | Status: DC | PRN

## 2013-10-28 MED ORDER — ONDANSETRON HCL 4 MG/2ML IJ SOLN
INTRAMUSCULAR | Status: DC | PRN
  Administered 2013-10-28: 4 mg via INTRAVENOUS

## 2013-10-28 MED ORDER — ALBUTEROL SULFATE (2.5 MG/3ML) 0.083% IN NEBU
2.5000 mg | INHALATION_SOLUTION | Freq: Once | RESPIRATORY_TRACT | Status: AC | PRN
  Administered 2013-10-28: 2.5 mg via RESPIRATORY_TRACT

## 2013-10-28 MED ORDER — FENTANYL CITRATE 0.05 MG/ML IJ SOLN
INTRAMUSCULAR | Status: AC
  Filled 2013-10-28: qty 4

## 2013-10-28 MED ORDER — VANCOMYCIN HCL IN DEXTROSE 1-5 GM/200ML-% IV SOLN
INTRAVENOUS | Status: AC
  Filled 2013-10-28: qty 200

## 2013-10-28 MED ORDER — FENTANYL CITRATE 0.05 MG/ML IJ SOLN
INTRAMUSCULAR | Status: DC | PRN
  Administered 2013-10-28: 100 ug via INTRAVENOUS
  Administered 2013-10-28 (×2): 50 ug via INTRAVENOUS

## 2013-10-28 MED ORDER — VANCOMYCIN HCL IN DEXTROSE 1-5 GM/200ML-% IV SOLN
1000.0000 mg | INTRAVENOUS | Status: AC
  Administered 2013-10-28 (×2): 1000 mg via INTRAVENOUS

## 2013-10-28 MED ORDER — ONDANSETRON HCL 4 MG/2ML IJ SOLN: 4.0000 mg | Freq: Once | INTRAMUSCULAR | Status: DC | PRN

## 2013-10-28 SURGICAL SUPPLY — 35 items
BLADE SURG 15 STRL LF DISP TIS (BLADE) ×1 IMPLANT
BLADE SURG 15 STRL SS (BLADE) ×6
BNDG CMPR 9X4 STRL LF SNTH (GAUZE/BANDAGES/DRESSINGS) ×1
BNDG COHESIVE 3X5 TAN STRL LF (GAUZE/BANDAGES/DRESSINGS) ×5 IMPLANT
BNDG ESMARK 4X9 LF (GAUZE/BANDAGES/DRESSINGS) ×2 IMPLANT
BNDG GAUZE ELAST 4 BULKY (GAUZE/BANDAGES/DRESSINGS) ×5 IMPLANT
CHLORAPREP W/TINT 26ML (MISCELLANEOUS) ×5 IMPLANT
CORDS BIPOLAR (ELECTRODE) ×5 IMPLANT
COVER MAYO STAND STRL (DRAPES) ×5 IMPLANT
COVER TABLE BACK 60X90 (DRAPES) ×3 IMPLANT
CUFF TOURNIQUET SINGLE 18IN (TOURNIQUET CUFF) ×5 IMPLANT
DRAPE EXTREMITY T 121X128X90 (DRAPE) ×5 IMPLANT
DRAPE SURG 17X23 STRL (DRAPES) ×5 IMPLANT
DRSG PAD ABDOMINAL 8X10 ST (GAUZE/BANDAGES/DRESSINGS) ×5 IMPLANT
GAUZE SPONGE 4X4 12PLY STRL (GAUZE/BANDAGES/DRESSINGS) ×5 IMPLANT
GAUZE XEROFORM 1X8 LF (GAUZE/BANDAGES/DRESSINGS) ×5 IMPLANT
GLOVE BIOGEL PI IND STRL 7.0 (GLOVE) IMPLANT
GLOVE BIOGEL PI IND STRL 8.5 (GLOVE) ×1 IMPLANT
GLOVE BIOGEL PI INDICATOR 7.0 (GLOVE) ×2
GLOVE BIOGEL PI INDICATOR 8.5 (GLOVE) ×2
GLOVE ECLIPSE 7.0 STRL STRAW (GLOVE) ×2 IMPLANT
GLOVE SURG ORTHO 8.0 STRL STRW (GLOVE) ×3 IMPLANT
GOWN STRL REUS W/ TWL LRG LVL3 (GOWN DISPOSABLE) ×1 IMPLANT
GOWN STRL REUS W/TWL LRG LVL3 (GOWN DISPOSABLE) ×3
GOWN STRL REUS W/TWL XL LVL3 (GOWN DISPOSABLE) ×3 IMPLANT
NEEDLE 27GAX1X1/2 (NEEDLE) ×2 IMPLANT
NS IRRIG 1000ML POUR BTL (IV SOLUTION) ×3 IMPLANT
PACK BASIN DAY SURGERY FS (CUSTOM PROCEDURE TRAY) ×3 IMPLANT
STOCKINETTE 4X48 STRL (DRAPES) ×5 IMPLANT
SUT VICRYL 4-0 PS2 18IN ABS (SUTURE) IMPLANT
SUT VICRYL RAPIDE 4/0 PS 2 (SUTURE) ×5 IMPLANT
SYR BULB 3OZ (MISCELLANEOUS) ×3 IMPLANT
SYR CONTROL 10ML LL (SYRINGE) ×2 IMPLANT
TOWEL OR 17X24 6PK STRL BLUE (TOWEL DISPOSABLE) ×3 IMPLANT
UNDERPAD 30X30 INCONTINENT (UNDERPADS AND DIAPERS) ×5 IMPLANT

## 2013-10-28 NOTE — Brief Op Note (Signed)
10/28/2013  9:51 AM  PATIENT:  Vicki [name]  48 y.o. female  PRE-OPERATIVE DIAGNOSIS:  RIGHT AND LEFT CARPAL TUNNEL SYNDROME   POST-OPERATIVE DIAGNOSIS:  RIGHT AND LEFT CARPAL TUNNEL SYNDROME   PROCEDURE:  Procedure(s): BILATERAL CARPAL TUNNEL RELEASE (Bilateral)  SURGEON:  Surgeon(s) and Role:    * Wynonia Sours, MD - Primary  PHYSICIAN ASSISTANT:   ASSISTANTS: none   ANESTHESIA:   local and general  EBL:  Total I/O In: 1000 [I.V.:1000] Out: -   BLOOD ADMINISTERED:none  DRAINS: none   LOCAL MEDICATIONS USED:  BUPIVICAINE   SPECIMEN:  No Specimen  DISPOSITION OF SPECIMEN:  N/A  COUNTS:  YES  TOURNIQUET:   Total Tourniquet Time Documented: Forearm (Right) - 22 minutes Total: Forearm (Right) - 22 minutes  Forearm (Left) - 16 minutes Total: Forearm (Left) - 16 minutes   DICTATION: .Other Dictation: Dictation Number [id number]  PLAN OF CARE: Discharge to home after PACU  PATIENT DISPOSITION:  PACU - hemodynamically stable.

## 2013-10-28 NOTE — H&P (Signed)
Vicki [name] is a 48 year old left hand dominant female with bilateral numbness, tingling and pain in the radial aspect of her hands bilaterally. This has been going on for a month. She complains of burning pain almost constant in nature, left greater than right. She recalls no history of injury to the hand or neck. She has been taking ibuprofen 800 mg t.i.d. She is awakened 7 out of 7 nights. No history of diabetes, thyroid problems, arthritis or gout. She complains of constant severe sharp, dull, stabbing, throbbing and burning type pain with a feeling of swelling, numbness and weakness to the thumb through ring fingers. She states it is getting worse. Activity, exercise and work makes this worse and nothing has seemed to help. Nerve conduction testing show bilateral carpal tunnel syndrome. Injection to the carpal canal have resulted in temporary relief.  PAST MEDICAL HISTORY: She has the following allergies: Aleve, Benadryl, Depo-Provera, Naproxen, Oxycodone HCI, Tramadol, PCN and Jasmine. She is currently taking aspirin, Lorazepam, Citalopram, and Advair. She has an Epi Pen when needed, and Albuterol. She has had a cholecystectomy, hysterectomy, D&C, ACL times 2.   FAMILY H ISTORY: Positive for diabetes, heart disease, high BP and arthritis.  SOCIAL HISTORY: She smokes  PPD and is advised to quit and the reasons behind this. She drinks socially. She is married.  REVIEW OF SYSTEMS: Positive for asthma, cough, shortness of breath, headaches, depression, nervousness, sleep disorder, easy bruising, otherwise negative.  Vicki [name] is an 48 y.o. female.   Chief Complaint: Bilateral CTS HPI: see above  Past Medical History  Diagnosis Date  . COPD (chronic obstructive pulmonary disease)   . Carpal tunnel syndrome     Past Surgical History  Procedure Laterality Date  . Bunionectomy  2006    both  . Arthroscopic repair acl Left 2011    x 2  . Appendectomy    . Abdominal  hysterectomy  2009  . Dilation and curettage of uterus    . Tubal ligation  2004    History reviewed. No pertinent family history. Social History:  reports that she has been smoking.  She does not have any smokeless tobacco history on file. She reports that she drinks alcohol. She reports that she does not use illicit drugs.  Allergies:  Allergies  Allergen Reactions  . Penicillins Anaphylaxis  . Aleve [Naproxen Sodium]   . Benadryl [Diphenhydramine]   . Ceftin [Cefuroxime Axetil]   . Depo-Provera [Medroxyprogesterone Acetate]   . Jasmine Oil   . Naproxen   . Oxycodone   . Tramadol     No prescriptions prior to admission    No results found for this or any previous visit (from the past 48 hour(s)).  No results found.   Pertinent items are noted in HPI.  Height 5\' 3"  (1.6 m), weight 69.4 kg (153 lb).  General appearance: alert, cooperative and appears stated age Head: Normocephalic, without obvious abnormality Neck: no JVD Resp: clear to auscultation bilaterally Cardio: regular rate and rhythm, S1, S2 normal, no murmur, click, rub or gallop GI: soft, non-tender; bowel sounds normal; no masses,  no organomegaly Extremities: numbness and tingling bilateral median nerve dist Pulses: 2+ and symmetric Skin: Skin color, texture, turgor normal. No rashes or lesions Neurologic: Grossly normal Incision/Wound: na  Assessment/Plan She is complaining of numbness, tingling bilaterally and would like to have these operated on. She would also like to have both done at the same time.  The pre, peri and postoperative course were  discussed along with the risks and complications.  The patient is aware there is no guarantee with the surgery, possibility of infection, recurrence, injury to arteries, nerves, tendons, incomplete relief of symptoms and dystrophy. At her request bilateral carpal tunnel releases are scheduled as an outpatient  KUZMA,GARY R 10/28/2013, 5:41 AM

## 2013-10-28 NOTE — Op Note (Signed)
NAMECORBIN, HOTT.:  [id number]  MEDICAL RECORD NO.:  [id number]  LOCATION:                                 FACILITY:  PHYSICIAN:  Daryll Brod, M.D.            DATE OF BIRTH:  DATE OF PROCEDURE:  10/28/2013 DATE OF DISCHARGE:                              OPERATIVE REPORT   PREOPERATIVE DIAGNOSIS:  Bilateral carpal tunnel syndrome.  POSTOPERATIVE DIAGNOSIS:  Bilateral carpal tunnel syndrome.  OPERATION:  Release of right and left carpal tunnels.  SURGEON:  Daryll Brod, MD.  ANESTHESIA:  General with local infiltration.  ANESTHESIOLOGIST:  Crews.  HISTORY:  The patient is a 48 year old female with a history of bilateral carpal tunnel syndrome, nerve conductions positive.  This has not responded to conservative treatment.  She has elected to undergo surgical decompression of both median nerves at the same time.  Pre, peri, and postoperative course have been discussed along with risks and complications.  She is aware that there is no guarantee with the surgery, possibility of infection; recurrence of injury to arteries, nerves, tendons; incomplete relief of symptoms, dystrophy.  In the preoperative area, the patient is seen, the extremity marked by both patient and surgeon.  Antibiotic given.  PROCEDURE IN DETAIL:  The patient was brought to the operating room, where general anesthetic was carried out without difficulty under the direction Dr. Al Corpus.  She was prepped using ChloraPrep, supine position with the right arm free.  A 3-minute dry time was allowed.  Time-out taken, confirming the patient and procedure.  Tourniquet placed on forearm, was inflated to 250 mmHg after exsanguination of the limb with an Esmarch bandage.  Longitudinal incision was made in the right palm, carried down through subcutaneous tissue.  Bleeders were electrocauterized with bipolar.  Palmar fascia was split.  Superficial palmar arch identified.  Flexor tendon to the ring  little finger identified to the ulnar side of the median nerve.  The carpal retinaculum was incised with sharp dissection.  Right angle and Sewall retractor were placed between skin and forearm fascia.  The fascia was released for approximately 2 cm proximal to the wrist crease under direct vision.  Canal was explored.  Persistent median artery was present.  No further lesions were identified.  Area of compression of the nerve was apparent.  The wound was irrigated.  The skin then closed with a subcuticular 4-0 Vicryl Rapide sutures.  Dermabond was applied to the wound.  The area was then infiltrated with 0.25% Marcaine without epinephrine, approximately 5 mL was used.  A sterile compressive dressing with fingers free was applied.  On deflation of the tourniquet, all fingers immediately pinked.  The left side was then prepped with ChloraPrep.  A time-out taken, confirming the patient and procedure. The limb exsanguinated with an Esmarch bandage.  Tourniquet placed on left forearm was inflated to 250 mmHg.  A longitudinal incision was made in the left palm, carried down through subcutaneous tissue.  Bleeders again electrocauterized.  Palmar fascia was split, superficial palmar arch identified.  Flexor tendon to the ring and little finger identified to the ulnar side of  the median nerve.  Carpal retinaculum was incised with sharp dissection.  Right angle and Sewall retractor were placed between skin and forearm fascia.  The fascia released for approximately 2 cm proximal to the wrist crease under direct vision.  Canal was explored.  Area of compression to the nerve apparent.  Again, the persistent median artery was present, not thrombosed.  The wound was irrigated and the skin closed with subcuticular 4-0 Vicryl Rapide sutures.  Dermabond was applied sealing the wound.  This was then infiltrated with 0.25% Marcaine without epinephrine.  4.5 mL was used. Sterile compressive dressing with  fingers free was applied.  On deflation of the tourniquet, all fingers immediately pinked.  She was taken to the recovery room for observation in satisfactory condition. She will be discharged home to return to the [facility] in 1 week on [place] [facility].          ______________________________ Daryll Brod, M.D.     GK/MEDQ  D:  10/28/2013  T:  10/28/2013  Job:  [id number]

## 2013-10-28 NOTE — Transfer of Care (Signed)
Immediate Anesthesia Transfer of Care Note  Patient: Vicki [name]  Procedure(s) Performed: Procedure(s): BILATERAL CARPAL TUNNEL RELEASE (Bilateral)  Patient Location: PACU  Anesthesia Type:General  Level of Consciousness: awake and alert   Airway & Oxygen Therapy: Patient Spontanous Breathing and Patient connected to face mask oxygen  Post-op Assessment: Report given to PACU RN and Post -op Vital signs reviewed and stable  Post vital signs: Reviewed and stable  Complications: No apparent anesthesia complications

## 2013-10-28 NOTE — Anesthesia Procedure Notes (Signed)
Procedure Name: LMA Insertion Date/Time: 10/28/2013 8:41 AM Performed by: Lieutenant Diego Pre-anesthesia Checklist: Patient identified, Emergency Drugs available, Suction available and Patient being monitored Patient Re-evaluated:Patient Re-evaluated prior to inductionOxygen Delivery Method: Circle System Utilized Preoxygenation: Pre-oxygenation with 100% oxygen Intubation Type: IV induction Ventilation: Mask ventilation without difficulty LMA: LMA inserted LMA Size: 4.0 Number of attempts: 1 Airway Equipment and Method: bite block Placement Confirmation: positive ETCO2 and breath sounds checked- equal and bilateral Tube secured with: Tape Dental Injury: Teeth and Oropharynx as per pre-operative assessment  Difficulty Due To: Difficult Airway- due to limited oral opening

## 2013-10-28 NOTE — Anesthesia Postprocedure Evaluation (Signed)
  Anesthesia Post-op Note  Patient: Vicki [name]  Procedure(s) Performed: Procedure(s): BILATERAL CARPAL TUNNEL RELEASE (Bilateral)  Patient Location: PACU  Anesthesia Type:General  Level of Consciousness: awake, alert  and oriented  Airway and Oxygen Therapy: Patient Spontanous Breathing  Post-op Pain: mild  Post-op Assessment: Post-op Vital signs reviewed  Post-op Vital Signs: Reviewed  Last Vitals:  Filed Vitals:   10/28/13 1236  BP: 116/78  Pulse: 101  Temp: 36.6 C  Resp: 18    Complications: No apparent anesthesia complications

## 2013-10-28 NOTE — Discharge Instructions (Addendum)

## 2013-10-28 NOTE — Anesthesia Preprocedure Evaluation (Addendum)
Anesthesia Evaluation  Patient identified by MRN, date of birth, ID band Patient awake    Reviewed: Allergy & Precautions, H&P , NPO status , Patient's Chart, lab work & pertinent test results  Airway Mallampati: I TM Distance: >3 FB Neck ROM: Full    Dental  (+) Teeth Intact, Dental Advisory Given   Pulmonary COPD COPD inhaler, Current Smoker,  breath sounds clear to auscultation        Cardiovascular Rhythm:Regular Rate:Normal     Neuro/Psych    GI/Hepatic   Endo/Other    Renal/GU      Musculoskeletal   Abdominal   Peds  Hematology   Anesthesia Other Findings   Reproductive/Obstetrics                          Anesthesia Physical Anesthesia Plan  ASA: II  Anesthesia Plan: General   Post-op Pain Management:    Induction: Intravenous  Airway Management Planned: LMA  Additional Equipment:   Intra-op Plan:   Post-operative Plan: Extubation in OR  Informed Consent: I have reviewed the patients History and Physical, chart, labs and discussed the procedure including the risks, benefits and alternatives for the proposed anesthesia with the patient or authorized representative who has indicated his/her understanding and acceptance.   Dental advisory given  Plan Discussed with: CRNA, Anesthesiologist and Surgeon  Anesthesia Plan Comments:         Anesthesia Quick Evaluation

## 2013-10-28 NOTE — Op Note (Signed)
Dictation Number [id number]

## 2013-10-29 ENCOUNTER — Encounter (HOSPITAL_BASED_OUTPATIENT_CLINIC_OR_DEPARTMENT_OTHER): Payer: Self-pay | Admitting: Orthopedic Surgery

## 2014-04-26 ENCOUNTER — Emergency Department (HOSPITAL_COMMUNITY)
Admission: EM | Admit: 2014-04-26 | Discharge: 2014-04-26 | Disposition: A | Discharge status: Nursing Home (Includes ICF) | Payer: BC Managed Care – PPO | Source: Home / Self Care | Attending: Emergency Medicine | Admitting: Emergency Medicine

## 2014-04-26 ENCOUNTER — Emergency Department (HOSPITAL_COMMUNITY)
Admission: EM | Admit: 2014-04-26 | Discharge: 2014-04-26 | Disposition: A | Discharge status: Home / Self Care | Payer: BC Managed Care – PPO | Attending: Emergency Medicine | Admitting: Emergency Medicine

## 2014-04-26 ENCOUNTER — Encounter (HOSPITAL_COMMUNITY): Payer: Self-pay | Admitting: Emergency Medicine

## 2014-04-26 ENCOUNTER — Emergency Department (HOSPITAL_COMMUNITY): Payer: BC Managed Care – PPO

## 2014-04-26 DIAGNOSIS — Z8669 Personal history of other diseases of the nervous system and sense organs: Secondary | ICD-10-CM | POA: Insufficient documentation

## 2014-04-26 DIAGNOSIS — Z9089 Acquired absence of other organs: Secondary | ICD-10-CM | POA: Diagnosis not present

## 2014-04-26 DIAGNOSIS — R1032 Left lower quadrant pain: Secondary | ICD-10-CM

## 2014-04-26 DIAGNOSIS — Z7982 Long term (current) use of aspirin: Secondary | ICD-10-CM | POA: Diagnosis not present

## 2014-04-26 DIAGNOSIS — Z79899 Other long term (current) drug therapy: Secondary | ICD-10-CM | POA: Diagnosis not present

## 2014-04-26 DIAGNOSIS — J449 Chronic obstructive pulmonary disease, unspecified: Secondary | ICD-10-CM | POA: Insufficient documentation

## 2014-04-26 DIAGNOSIS — Z9851 Tubal ligation status: Secondary | ICD-10-CM | POA: Diagnosis not present

## 2014-04-26 DIAGNOSIS — Z7951 Long term (current) use of inhaled steroids: Secondary | ICD-10-CM | POA: Insufficient documentation

## 2014-04-26 DIAGNOSIS — Z720 Tobacco use: Secondary | ICD-10-CM | POA: Insufficient documentation

## 2014-04-26 DIAGNOSIS — K5732 Diverticulitis of large intestine without perforation or abscess without bleeding: Secondary | ICD-10-CM | POA: Diagnosis not present

## 2014-04-26 DIAGNOSIS — Z90710 Acquired absence of both cervix and uterus: Secondary | ICD-10-CM | POA: Insufficient documentation

## 2014-04-26 DIAGNOSIS — Z880 Allergy status to penicillin: Secondary | ICD-10-CM | POA: Diagnosis not present

## 2014-04-26 HISTORY — DX: Unspecified asthma, uncomplicated: J45.909

## 2014-04-26 LAB — CBC WITH DIFFERENTIAL/PLATELET
BASOS PCT: 0 % (ref 0–1)
Basophils Absolute: 0 10*3/uL (ref 0.0–0.1)
EOS ABS: 0.2 10*3/uL (ref 0.0–0.7)
Eosinophils Relative: 1 % (ref 0–5)
HCT: 41.6 % (ref 36.0–46.0)
Hemoglobin: 14 g/dL (ref 12.0–15.0)
Lymphocytes Relative: 18 % (ref 12–46)
Lymphs Abs: 2 10*3/uL (ref 0.7–4.0)
MCH: 31.9 pg (ref 26.0–34.0)
MCHC: 33.7 g/dL (ref 30.0–36.0)
MCV: 94.8 fL (ref 78.0–100.0)
Monocytes Absolute: 1 10*3/uL (ref 0.1–1.0)
Monocytes Relative: 9 % (ref 3–12)
Neutro Abs: 8.2 10*3/uL — ABNORMAL HIGH (ref 1.7–7.7)
Neutrophils Relative %: 72 % (ref 43–77)
PLATELETS: 210 10*3/uL (ref 150–400)
RBC: 4.39 MIL/uL (ref 3.87–5.11)
RDW: 12.6 % (ref 11.5–15.5)
WBC: 11.4 10*3/uL — ABNORMAL HIGH (ref 4.0–10.5)

## 2014-04-26 LAB — COMPREHENSIVE METABOLIC PANEL
ALBUMIN: 3.8 g/dL (ref 3.5–5.2)
ALK PHOS: 101 U/L (ref 39–117)
ALT: 15 U/L (ref 0–35)
AST: 20 U/L (ref 0–37)
Anion gap: 14 (ref 5–15)
BUN: 16 mg/dL (ref 6–23)
CO2: 23 mEq/L (ref 19–32)
Calcium: 9.8 mg/dL (ref 8.4–10.5)
Chloride: 97 mEq/L (ref 96–112)
Creatinine, Ser: 0.63 mg/dL (ref 0.50–1.10)
GFR calc Af Amer: >90 mL/min (ref >90)
GFR calc non Af Amer: >90 mL/min (ref >90)
Glucose, Bld: 94 mg/dL (ref 70–99)
Potassium: 4.8 mEq/L (ref 3.7–5.3)
SODIUM: 134 meq/L — AB (ref 137–147)
TOTAL PROTEIN: 7.6 g/dL (ref 6.0–8.3)
Total Bilirubin: 0.4 mg/dL (ref 0.3–1.2)

## 2014-04-26 LAB — POCT URINALYSIS DIP (DEVICE)
GLUCOSE, UA: NEGATIVE mg/dL
HGB URINE DIPSTICK: NEGATIVE
Leukocytes, UA: NEGATIVE
NITRITE: NEGATIVE
PH: 5.5 (ref 5.0–8.0)
Protein, ur: NEGATIVE mg/dL
Specific Gravity, Urine: 1.025 (ref 1.005–1.030)
UROBILINOGEN UA: 0.2 mg/dL (ref 0.0–1.0)

## 2014-04-26 LAB — LIPASE, BLOOD: Lipase: 51 U/L (ref 11–59)

## 2014-04-26 MED ORDER — OXYCODONE-ACETAMINOPHEN 5-325 MG PO TABS: 2.0000 | ORAL_TABLET | ORAL | Status: DC | PRN

## 2014-04-26 MED ORDER — MORPHINE SULFATE 4 MG/ML IJ SOLN
4.0000 mg | Freq: Once | INTRAMUSCULAR | Status: AC
  Administered 2014-04-26: 4 mg via INTRAVENOUS
  Filled 2014-04-26: qty 1

## 2014-04-26 MED ORDER — ONDANSETRON 4 MG PO TBDP: 4.0000 mg | ORAL_TABLET | Freq: Three times a day (TID) | ORAL | Status: DC | PRN

## 2014-04-26 MED ORDER — CIPROFLOXACIN HCL 500 MG PO TABS
500.0000 mg | ORAL_TABLET | Freq: Once | ORAL | Status: AC
  Administered 2014-04-26: 500 mg via ORAL
  Filled 2014-04-26: qty 1

## 2014-04-26 MED ORDER — HYDROMORPHONE HCL 1 MG/ML IJ SOLN
1.0000 mg | Freq: Once | INTRAMUSCULAR | Status: AC
  Administered 2014-04-26: 1 mg via INTRAVENOUS
  Filled 2014-04-26: qty 1

## 2014-04-26 MED ORDER — IOHEXOL 300 MG/ML  SOLN
100.0000 mL | Freq: Once | INTRAMUSCULAR | Status: AC | PRN
  Administered 2014-04-26: 100 mL via INTRAVENOUS

## 2014-04-26 MED ORDER — METRONIDAZOLE 500 MG PO TABS
500.0000 mg | ORAL_TABLET | Freq: Once | ORAL | Status: AC
  Administered 2014-04-26: 500 mg via ORAL
  Filled 2014-04-26: qty 1

## 2014-04-26 MED ORDER — SODIUM CHLORIDE 0.9 % IV BOLUS (SEPSIS)
1000.0000 mL | Freq: Once | INTRAVENOUS | Status: AC
  Administered 2014-04-26: 1000 mL via INTRAVENOUS

## 2014-04-26 MED ORDER — IOHEXOL 300 MG/ML  SOLN
25.0000 mL | Freq: Once | INTRAMUSCULAR | Status: AC | PRN
  Administered 2014-04-26: 25 mL via ORAL

## 2014-04-26 MED ORDER — CIPROFLOXACIN HCL 500 MG PO TABS: 500.0000 mg | ORAL_TABLET | Freq: Two times a day (BID) | ORAL | Status: DC

## 2014-04-26 MED ORDER — ONDANSETRON HCL 4 MG/2ML IJ SOLN
4.0000 mg | Freq: Once | INTRAMUSCULAR | Status: AC
  Administered 2014-04-26: 4 mg via INTRAVENOUS
  Filled 2014-04-26: qty 2

## 2014-04-26 MED ORDER — METRONIDAZOLE 500 MG PO TABS: 500.0000 mg | ORAL_TABLET | Freq: Three times a day (TID) | ORAL | Status: DC

## 2014-04-26 NOTE — ED Notes (Signed)
Pt c/o LLQ abdominal pain x 1 week. Pt seen at urgent care and sent here for further eval. Pt reports last BM today but "runny."

## 2014-04-26 NOTE — Discharge Instructions (Signed)
We have determined that your problem requires further evaluation in the emergency department.  We will take care of your transport there.  Once at the emergency department, you will be evaluated by a provider and they will order whatever treatment or tests they deem necessary.  We cannot guarantee that they will do any specific test or do any specific treatment.    Diverticulitis Diverticulitis is inflammation or infection of small pouches in your colon that form when you have a condition called diverticulosis. The pouches in your colon are called diverticula. Your colon, or large intestine, is where water is absorbed and stool is formed. Complications of diverticulitis can include:  Bleeding.  Severe infection.  Severe pain.  Perforation of your colon.  Obstruction of your colon. CAUSES  Diverticulitis is caused by bacteria. Diverticulitis happens when stool becomes trapped in diverticula. This allows bacteria to grow in the diverticula, which can lead to inflammation and infection. RISK FACTORS People with diverticulosis are at risk for diverticulitis. Eating a diet that does not include enough fiber from fruits and vegetables may make diverticulitis more likely to develop. SYMPTOMS  Symptoms of diverticulitis may include:  Abdominal pain and tenderness. The pain is normally located on the left side of the abdomen, but may occur in other areas.  Fever and chills.  Bloating.  Cramping.  Nausea.  Vomiting.  Constipation.  Diarrhea.  Blood in your stool. DIAGNOSIS  Your health care provider will ask you about your medical history and do a physical exam. You may need to have tests done because many medical conditions can cause the same symptoms as diverticulitis. Tests may include:  Blood tests.  Urine tests.  Imaging tests of the abdomen, including X-rays and CT scans. When your condition is under control, your health care provider may recommend that you have a  colonoscopy. A colonoscopy can show how severe your diverticula are and whether something else is causing your symptoms. TREATMENT  Most cases of diverticulitis are mild and can be treated at home. Treatment may include:  Taking over-the-counter pain medicines.  Following a clear liquid diet.  Taking antibiotic medicines by mouth for 7-10 days. More severe cases may be treated at a hospital. Treatment may include:  Not eating or drinking.  Taking prescription pain medicine.  Receiving antibiotic medicines through an IV tube.  Receiving fluids and nutrition through an IV tube.  Surgery. HOME CARE INSTRUCTIONS   Follow your health care provider's instructions carefully.  Follow a full liquid diet or other diet as directed by your health care provider. After your symptoms improve, your health care provider may tell you to change your diet. He or she may recommend you eat a high-fiber diet. Fruits and vegetables are good sources of fiber. Fiber makes it easier to pass stool.  Take fiber supplements or probiotics as directed by your health care provider.  Only take medicines as directed by your health care provider.  Keep all your follow-up appointments. SEEK MEDICAL CARE IF:   Your pain does not improve.  You have a hard time eating food.  Your bowel movements do not return to normal. SEEK IMMEDIATE MEDICAL CARE IF:   Your pain becomes worse.  Your symptoms do not get better.  Your symptoms suddenly get worse.  You have a fever.  You have repeated vomiting.  You have bloody or black, tarry stools. MAKE SURE YOU:   Understand these instructions.  Will watch your condition.  Will get help right away if you  are not doing well or get worse. Document Released: 02/07/2005 Document Revised: 05/05/2013 Document Reviewed: 03/25/2013 [facility] Patient Information 2015 [place], [place]. This information is not intended to replace advice given to you by your health care  provider. Make sure you discuss any questions you have with your health care provider.

## 2014-04-26 NOTE — ED Provider Notes (Signed)
Chief Complaint   Abdominal Pain   History of Present Illness   Vicki [name] is a 48 year old female with a one-week history of mild bilateral pelvic pain worse on the left than the right. Yesterday the pain became worse and today even worse yet. Is rated as 7/10 in intensity at the worst and now is a 7/10. Is in the bilateral lower abdomen but worse on the left than the right. It's worse if she walks or goes over bumps and better with ibuprofen. She hasn't had much of an appetite and has had alternating constipation and diarrhea. She denies any fever, chills, sweats, nausea, vomiting, blood in the stool, or melena. She's had no urinary or GYN complaints. She had a hysterectomy in 2010 but still has her ovaries. The patient also notes that she fell 3 weeks ago, landing on her buttocks. She's had pain in her lower back with radiation to her right leg since then. She denies any history of diverticulosis, diverticulitis, ovarian cysts, or colitis.  Review of Systems   Other than as noted above, the patient denies any of the following symptoms: Constitutional:  No fever, chills, weight loss or anorexia. Abdomen:  No nausea, vomiting, hematememesis, melena, diarrhea, or hematochezia. GU:  No dysuria, frequency, urgency, or hematuria. Gyn:  No vaginal discharge, itching, abnormal bleeding, dyspareunia, or pelvic pain.  [place]   Past medical history, family history, social history, meds, and allergies were reviewed. She is allergic to penicillins, Aleve, Benadryl, Ceftin, Depo-Provera, Jasmine oil, naproxen, oxycodone, and tramadol. She has a history of COPD, carpal tunnel syndrome, depression, and anxiety and current meds include albuterol, Xanax, aspirin, Celexa, and Advair.  Physical Exam     Vital signs:  BP 127/87 mmHg  Pulse 117  Temp(Src) 97.5 F (36.4 C) (Oral)  Resp 18  SpO2 96% Gen:  Alert, oriented, in no distress. Lungs:  Breath sounds clear and equal bilaterally.  No  wheezes, rales or rhonchi. Heart:  Regular rhythm.  No gallops or murmers.   Abdomen:  Soft, flat, nondistended. No organomegaly or mass. Bowel sounds were normally active. There is pain to palpation across the entire lower abdomen with guarding and rebound on the left side. Pelvic:  Normal external genitalia, vaginal mucosa normal, cervix and uterus surgically absent. No adnexal masses or tenderness.   Skin:  Clear, warm and dry.  No rash.  Labs   Results for orders placed or performed during the hospital encounter of 04/26/14  POCT urinalysis dip (device)  Result Value Ref Range   Glucose, UA NEGATIVE NEGATIVE mg/dL   Bilirubin Urine SMALL (A) NEGATIVE   Ketones, ur TRACE (A) NEGATIVE mg/dL   Specific Gravity, Urine 1.025 1.005 - 1.030   Hgb urine dipstick NEGATIVE NEGATIVE   pH 5.5 5.0 - 8.0   Protein, ur NEGATIVE NEGATIVE mg/dL   Urobilinogen, UA 0.2 0.0 - 1.0 mg/dL   Nitrite NEGATIVE NEGATIVE   Leukocytes, UA NEGATIVE NEGATIVE   Assessment   The encounter diagnosis was LLQ pain.  Diagnosis is probably diverticulitis, although early appendicitis is also a less likely possibility.  Plan     The patient was transferred to the ED via private vehicle in stable condition.  Medical Decision Making:  48 year old female with a one-week history of gradually increasing left lower quadrant pain and anorexia. Denies fever, nausea, vomiting, or blood in the stool. Has had alternating diarrhea and constipation, no urinary or GYN complaints. On exam she is tender in the left lower  quadrant with guarding and rebound. Pelvic Exam is normal. UA is unremarkable. Exam is consistent with diverticulitis and she is transferred to the emergency department by private vehicle.      Harden Mo, MD 04/26/14 [phone]

## 2014-04-26 NOTE — ED Notes (Signed)
Patient instructed to go to [facility]

## 2014-04-26 NOTE — ED Provider Notes (Signed)
CSN: [id number]     Arrival date & time 04/26/14  0865 History   First MD Initiated Contact with Patient 04/26/14 1101     Chief Complaint  Patient presents with  . Abdominal Pain  . Diarrhea     (Consider location/radiation/quality/duration/timing/severity/associated sxs/prior Treatment) HPI Comments: Patient is a 48 year old female with a past medical history of COPD who presents with a 1 week history of abdominal pain. The pain is located in her lower abdomen and does not radiate and is worse on the left. The pain is described as sharp and severe. The pain started gradually and progressively worsened since the onset. No alleviating/aggravating factors. The patient has tried nothing for symptoms without relief. Associated symptoms include alternating diarrhea and constipation. Patient denies fever, headache, nausea, vomiting, chest pain, SOB, dysuria, abnormal vaginal bleeding/discharge. Patient reports previous appendectomy and hysterectomy.      Past Medical History  Diagnosis Date  . COPD (chronic obstructive pulmonary disease)   . Carpal tunnel syndrome    Past Surgical History  Procedure Laterality Date  . Bunionectomy  2006    both  . Arthroscopic repair acl Left 2011    x 2  . Appendectomy    . Abdominal hysterectomy  2009  . Dilation and curettage of uterus    . Tubal ligation  2004  . Carpal tunnel release Bilateral 10/28/2013    Procedure: BILATERAL CARPAL TUNNEL RELEASE;  Surgeon: Wynonia Sours, MD;  Location: [facility];  Service: Orthopedics;  Laterality: Bilateral;   No family history on file. History  Substance Use Topics  . Smoking status: Current Every Day Smoker -- 0.50 packs/day  . Smokeless tobacco: Not on file  . Alcohol Use: Yes     Comment: occ   OB History    No data available     Review of Systems  Constitutional: Negative for fever, chills and fatigue.  HENT: Negative for trouble swallowing.   Eyes: Negative for visual  disturbance.  Respiratory: Negative for shortness of breath.   Cardiovascular: Negative for chest pain and palpitations.  Gastrointestinal: Positive for abdominal pain and diarrhea. Negative for nausea and vomiting.  Genitourinary: Negative for dysuria and difficulty urinating.  Musculoskeletal: Negative for arthralgias and neck pain.  Skin: Negative for color change.  Neurological: Negative for dizziness and weakness.  Psychiatric/Behavioral: Negative for dysphoric mood.      Allergies  Penicillins; Aleve; Benadryl; Ceftin; Depo-provera; Jasmine oil; Naproxen; Oxycodone; and Tramadol  Home Medications   Prior to Admission medications   Medication Sig Start Date End Date Taking? Authorizing Provider  albuterol (PROVENTIL HFA;VENTOLIN HFA) 108 (90 BASE) MCG/ACT inhaler Inhale into the lungs every 6 (six) hours as needed for wheezing or shortness of breath.    Historical Provider, MD  ALPRAZolam Duanne Moron) 1 MG tablet Take 1 mg by mouth at bedtime as needed for sleep.    Historical Provider, MD  aspirin 81 MG tablet Take 81 mg by mouth daily.    Historical Provider, MD  citalopram (CELEXA) 10 MG tablet Take 10 mg by mouth daily.    Historical Provider, MD  Fluticasone-Salmeterol (ADVAIR HFA IN) Inhale into the lungs.    Historical Provider, MD   BP 119/97 mmHg  Pulse 107  Temp(Src) 98 F (36.7 C) (Oral)  Resp 18  Ht 5\' 4"  (1.626 m)  Wt 151 lb (68.493 kg)  BMI 25.91 kg/m2  SpO2 99% Physical Exam  Constitutional: She is oriented to person, place, and time. She  appears well-developed and well-nourished. No distress.  HENT:  Head: Normocephalic and atraumatic.  Eyes: Conjunctivae and EOM are normal.  Neck: Normal range of motion.  Cardiovascular: Normal rate and regular rhythm.  Exam reveals no gallop and no friction rub.   No murmur heard. Pulmonary/Chest: Effort normal and breath sounds normal. She has no wheezes. She has no rales. She exhibits no tenderness.  Abdominal: Soft.  She exhibits no distension. There is tenderness. There is no rebound and no guarding.  Severe left lower abdominal tenderness to palpation. There is also suprapubic and right lower quadrant tenderness to palpation. No peritoneal signs.   Musculoskeletal: Normal range of motion.  Neurological: She is alert and oriented to person, place, and time. Coordination normal.  Speech is goal-oriented. Moves limbs without ataxia.   Skin: Skin is warm and dry.  Psychiatric: She has a normal mood and affect. Her behavior is normal.  Nursing note and vitals reviewed.   ED Course  Procedures (including critical care time) Labs Review Labs Reviewed  CBC WITH DIFFERENTIAL - Abnormal; Notable for the following:    WBC 11.4 (*)    Neutro Abs 8.2 (*)    All other components within normal limits  COMPREHENSIVE METABOLIC PANEL - Abnormal; Notable for the following:    Sodium 134 (*)    All other components within normal limits  LIPASE, BLOOD    Imaging Review Ct Abdomen Pelvis W Contrast  04/26/2014   CLINICAL DATA:  Lower abdominal pain, back pain, left lower quadrant pain for 2 days, worsening today.  EXAM: CT ABDOMEN AND PELVIS WITH CONTRAST  TECHNIQUE: Multidetector CT imaging of the abdomen and pelvis was performed using the standard protocol following bolus administration of intravenous contrast.  CONTRAST:  152mL OMNIPAQUE IOHEXOL 300 MG/ML  SOLN  COMPARISON:  03/29/2006  FINDINGS: Lung bases are clear.  No effusions.  Heart is normal size.  Prior cholecystectomy. Liver, spleen, pancreas, adrenals and kidneys are normal.  There is sigmoid diverticulosis. Inflammatory changes around the sigmoid colon compatible with active diverticulitis. No free air or focal fluid collection.  Prior hysterectomy. No adnexal masses. Urinary bladder is unremarkable. Stomach and small bowel unremarkable. Appendix is normal.  No acute bony abnormality or focal bone lesion.  IMPRESSION: Sigmoid diverticulosis with  inflammatory changes around the sigmoid colon compatible with active diverticulitis.   Electronically Signed   By: Rolm Baptise M.D.   On: 04/26/2014 12:56     EKG Interpretation None      MDM   Final diagnoses:  LLQ pain  Diverticulitis of large intestine without perforation or abscess without bleeding    11:02 AM Patient is tachycardic and afebrile on arrival. Patient will have CT abdomen pelvis to rule out diverticulitis.   1:25 PM Patient's labs show elevated WBC. CT abdomen pelvis shows diverticulitis. Patient will be treated with PO cipro and flagyl. Patient will have pain medication and zofran as needed. Vitals stable and patient afebrile.   Alvina Chou, PA-C 04/26/14 1417  Malvin Johns, MD 04/26/14 1420

## 2014-04-26 NOTE — Discharge Instructions (Signed)
Take Cipro and Flagyl as directed until gone. Take Oxycodone as needed for pain. Take zofran as needed for nausea. Refer to attached documents for more information. Return to the ED with worsening or concerning symptoms.

## 2014-06-07 ENCOUNTER — Other Ambulatory Visit: Payer: Self-pay | Admitting: Family Medicine

## 2014-06-07 DIAGNOSIS — Z803 Family history of malignant neoplasm of breast: Secondary | ICD-10-CM

## 2014-06-07 DIAGNOSIS — N6310 Unspecified lump in the right breast, unspecified quadrant: Principal | ICD-10-CM

## 2014-06-07 DIAGNOSIS — N6315 Unspecified lump in the right breast, overlapping quadrants: Secondary | ICD-10-CM

## 2014-06-14 ENCOUNTER — Ambulatory Visit
Admission: RE | Admit: 2014-06-14 | Discharge: 2014-06-14 | Disposition: A | Discharge status: Home / Self Care | Payer: BC Managed Care – PPO | Source: Ambulatory Visit | Attending: Family Medicine | Admitting: Family Medicine

## 2014-06-14 DIAGNOSIS — Z803 Family history of malignant neoplasm of breast: Secondary | ICD-10-CM

## 2014-06-14 DIAGNOSIS — N6310 Unspecified lump in the right breast, unspecified quadrant: Principal | ICD-10-CM

## 2014-06-14 DIAGNOSIS — N6315 Unspecified lump in the right breast, overlapping quadrants: Secondary | ICD-10-CM

## 2014-06-24 ENCOUNTER — Other Ambulatory Visit: Payer: Self-pay

## 2014-06-24 DIAGNOSIS — Z1231 Encounter for screening mammogram for malignant neoplasm of breast: Secondary | ICD-10-CM

## 2014-06-24 DIAGNOSIS — Z803 Family history of malignant neoplasm of breast: Secondary | ICD-10-CM

## 2014-07-16 ENCOUNTER — Ambulatory Visit: Payer: BC Managed Care – PPO

## 2014-07-21 ENCOUNTER — Ambulatory Visit: Payer: BC Managed Care – PPO

## 2014-07-26 ENCOUNTER — Ambulatory Visit: Payer: BC Managed Care – PPO

## 2014-07-29 ENCOUNTER — Ambulatory Visit
Admission: RE | Admit: 2014-07-29 | Discharge: 2014-07-29 | Disposition: A | Discharge status: Home / Self Care | Payer: BC Managed Care – PPO | Source: Ambulatory Visit

## 2014-07-29 DIAGNOSIS — Z1231 Encounter for screening mammogram for malignant neoplasm of breast: Secondary | ICD-10-CM

## 2014-07-29 DIAGNOSIS — Z803 Family history of malignant neoplasm of breast: Secondary | ICD-10-CM

## 2015-02-02 ENCOUNTER — Ambulatory Visit (HOSPITAL_COMMUNITY)
Admission: RE | Admit: 2015-02-02 | Discharge: 2015-02-02 | Disposition: A | Discharge status: Home / Self Care | Payer: BC Managed Care – PPO | Source: Ambulatory Visit | Attending: Family Medicine | Admitting: Family Medicine

## 2015-02-02 ENCOUNTER — Ambulatory Visit (INDEPENDENT_AMBULATORY_CARE_PROVIDER_SITE_OTHER): Payer: BC Managed Care – PPO | Admitting: Family Medicine

## 2015-02-02 ENCOUNTER — Ambulatory Visit (INDEPENDENT_AMBULATORY_CARE_PROVIDER_SITE_OTHER): Payer: BC Managed Care – PPO

## 2015-02-02 VITALS — BP 112/88 | HR 78 | Temp 98.0°F | Resp 16 | Ht 63.5 in | Wt 152.6 lb

## 2015-02-02 DIAGNOSIS — M25562 Pain in left knee: Secondary | ICD-10-CM

## 2015-02-02 DIAGNOSIS — N390 Urinary tract infection, site not specified: Secondary | ICD-10-CM

## 2015-02-02 DIAGNOSIS — M79662 Pain in left lower leg: Secondary | ICD-10-CM | POA: Insufficient documentation

## 2015-02-02 DIAGNOSIS — Z23 Encounter for immunization: Secondary | ICD-10-CM

## 2015-02-02 DIAGNOSIS — M7989 Other specified soft tissue disorders: Secondary | ICD-10-CM | POA: Diagnosis not present

## 2015-02-02 DIAGNOSIS — R350 Frequency of micturition: Secondary | ICD-10-CM | POA: Diagnosis not present

## 2015-02-02 LAB — POCT URINALYSIS DIP (MANUAL ENTRY)
BILIRUBIN UA: NEGATIVE
BILIRUBIN UA: NEGATIVE
Glucose, UA: NEGATIVE
NITRITE UA: NEGATIVE
Protein Ur, POC: NEGATIVE
RBC UA: NEGATIVE
Spec Grav, UA: 1.02
Urobilinogen, UA: 0.2
pH, UA: 5

## 2015-02-02 LAB — POC MICROSCOPIC URINALYSIS (UMFC): MUCUS RE: ABSENT

## 2015-02-02 MED ORDER — CIPROFLOXACIN HCL 500 MG PO TABS: 500.0000 mg | ORAL_TABLET | Freq: Two times a day (BID) | ORAL | Status: DC

## 2015-02-02 MED ORDER — MELOXICAM 7.5 MG PO TABS: 7.5000 mg | ORAL_TABLET | Freq: Two times a day (BID) | ORAL | Status: DC | PRN

## 2015-02-02 MED ORDER — CYCLOBENZAPRINE HCL 5 MG PO TABS: 5.0000 mg | ORAL_TABLET | Freq: Three times a day (TID) | ORAL | Status: DC | PRN

## 2015-02-02 NOTE — Progress Notes (Signed)
Preliminary results by tech - Left Lower Ext. Venous Duplex Completed. Negative for deep and superficial vein thrombosis and Baker's Cyst. Oda Cogan, BS, RDMS, RVT

## 2015-02-02 NOTE — Progress Notes (Signed)
Chief Complaint:  Chief Complaint  Patient presents with  . Knee Pain    knot in the back of Left knee - noticed x 4 days ago  . Urinary Frequency    burning , pressure x 1 1/2 week ago    HPI: Vicki [name] is a 49 y.o. female who reports to [facility] today complaining of left knee pain posteriorly since Sunday, no  Fevers, chill, n/v/abd pain, hip pain, back pain, NKI.  Low/no risk factors of PE/DVT. Denies recent trauma/surgeries, hx of DVT/PE, long car or plane rides, sedentary lifestyle, OCP use, or malignancy Has had ACL repair 16 years ago. She has 4/10 pain sitting, 6/10 when walking and then 10/10 pain after work, works at [employer], on her feet a lot. Doe snot feel unstable, she has pain in the back of her knee, cannot bend it very well.  Has tried ibuprofen 800 mg without relief.   Past Medical History  Diagnosis Date  . COPD (chronic obstructive pulmonary disease)   . Carpal tunnel syndrome   . Asthma    Past Surgical History  Procedure Laterality Date  . Bunionectomy  2006    both  . Arthroscopic repair acl Left 2011    x 2  . Appendectomy    . Abdominal hysterectomy  2009  . Dilation and curettage of uterus    . Tubal ligation  2004  . Carpal tunnel release Bilateral 10/28/2013    Procedure: BILATERAL CARPAL TUNNEL RELEASE;  Surgeon: Wynonia Sours, MD;  Location: [place];  Service: Orthopedics;  Laterality: Bilateral;   Social History   Social History  . Marital Status: Married    Spouse Name: N/A  . Number of Children: N/A  . Years of Education: N/A   Social History Main Topics  . Smoking status: Current Every Day Smoker -- 0.50 packs/day  . Smokeless tobacco: None  . Alcohol Use: Yes     Comment: occ  . Drug Use: No  . Sexual Activity: Not Asked   Other Topics Concern  . None   Social History Narrative   Family History  Problem Relation Age of Onset  . Cancer Mother   . Cancer Sister     Allergies  Allergen Reactions  . Jasmine Oil Shortness Of Breath and Swelling  . Penicillins Anaphylaxis and Hives  . Aleve [Naproxen Sodium] Itching  . Benadryl [Diphenhydramine] Other (See Comments)    hallucinations  . Ceftin [Cefuroxime Axetil] Other (See Comments)    unsure  . Depo-Provera [Medroxyprogesterone Acetate] Other (See Comments)    Severe acne  . Oxycodone Nausea And Vomiting  . Tramadol Other (See Comments)    Kept her up all night   Prior to Admission medications   Medication Sig Start Date End Date Taking? Authorizing Provider  albuterol (PROVENTIL HFA;VENTOLIN HFA) 108 (90 BASE) MCG/ACT inhaler Inhale into the lungs every 6 (six) hours as needed for wheezing or shortness of breath.   Yes Historical Provider, MD  ALPRAZolam Duanne Moron) 1 MG tablet Take 0.5-1 mg by mouth daily as needed for anxiety or sleep.    Yes Historical Provider, MD  aspirin 81 MG tablet Take 81 mg by mouth daily.   Yes Historical Provider, MD  citalopram (CELEXA) 40 MG tablet Take 60 mg by mouth daily.   Yes Historical Provider, MD  eletriptan (RELPAX) 40 MG tablet Take 40 mg by mouth as needed for migraine or headache. One tablet  by mouth at onset of headache. May repeat in 2 hours if headache persists or recurs.   Yes Historical Provider, MD  Fluticasone-Salmeterol (ADVAIR) 250-50 MCG/DOSE AEPB Inhale 1 puff into the lungs 2 (two) times daily.   Yes Historical Provider, MD  ibuprofen (ADVIL,MOTRIN) 800 MG tablet Take 800 mg by mouth every 8 (eight) hours as needed for mild pain.   Yes Historical Provider, MD  Multiple Vitamin (MULTIVITAMIN WITH MINERALS) TABS tablet Take 1 tablet by mouth daily.   Yes Historical Provider, MD     ROS: The patient denies fevers, chills, night sweats, unintentional weight loss, chest pain, palpitations, wheezing, dyspnea on exertion, nausea, vomiting, abdominal pain, dysuria, hematuria, melena, numbness, weakness, or tingling.   All other systems have been  reviewed and were otherwise negative with the exception of those mentioned in the HPI and as above.    PHYSICAL EXAM: Filed Vitals:   02/02/15 0828  BP: 112/88  Pulse: 78  Temp: 98 F (36.7 C)  Resp: 16   Body mass index is 26.6 kg/(m^2).   General: Alert, no acute distress HEENT:  Normocephalic, atraumatic, oropharynx patent. EOMI, PERRLA Cardiovascular:  Regular rate and rhythm, no rubs murmurs or gallops.  No Carotid bruits, radial pulse intact. No pedal edema.  Respiratory: Clear to auscultation bilaterally.  No wheezes, rales, or rhonchi.  No cyanosis, no use of accessory musculature Abdominal: No organomegaly, abdomen is soft and non-tender, positive bowel sounds. No masses. Skin: No rashes. Neurologic: Facial musculature symmetric. Psychiatric: Patient acts appropriately throughout our interaction. Lymphatic: No cervical or submandibular lymphadenopathy Musculoskeletal: Gait antalgic  No deformity, Neg ballotment, no pedal edema  Diffuse tenderness posterior knee, minimal swelling.  Decrease AROM, decrease PROM 5/5 strength, 2/2 DTRs ankle ,  Neg Lachman, Neg medial jt line tenderness, neg McMurray L spine normal ROM,  Straight leg negative Hip grossly normal ROM     LABS: Results for orders placed or performed in visit on 02/02/15  POCT Microscopic Urinalysis ([facility])  Result Value Ref Range   WBC,UR,HPF,POC Few (A) None WBC/hpf   RBC,UR,HPF,POC Few (A) None RBC/hpf   Bacteria Few (A) None   Mucus Absent Absent   Epithelial Cells, UR Per Microscopy Few (A) None cells/hpf  POCT urinalysis dipstick  Result Value Ref Range   Color, UA other (A) yellow   Clarity, UA cloudy (A) clear   Glucose, UA negative negative   Bilirubin, UA negative negative   Ketones, POC UA negative negative   Spec Grav, UA 1.020    Blood, UA negative negative   pH, UA 5.0    Protein Ur, POC negative negative   Urobilinogen, UA 0.2    Nitrite, UA Negative Negative   Leukocytes, UA  small (1+) (A) Negative     EKG/XRAY:   Primary read interpreted by Dr. Marin Comment at [facility]. Neg for fractures or dislocation No e/o popliteal    ASSESSMENT/PLAN: Encounter Diagnoses  Name Primary?  . Flu vaccine need   . Urinary frequency   . Posterior left knee pain   . Calf pain, left   . UTI (urinary tract infection), uncomplicated Yes   Will get stat doppler to rule out DVT vs popliteal cyst Rx Cipro for UTI, urine cx pending Mobic prn  Fu after [place] results Patient would like flu vaccine, given today as well.    Gross sideeffects, risk and benefits, and alternatives of medications d/w patient. Patient is aware that all medications have potential sideeffects and we are unable to  predict every sideeffect or drug-drug interaction that may occur.  Thao Le DO  02/02/2015 10:22 AM

## 2015-02-04 LAB — URINE CULTURE: Colony Count: >=100000

## 2015-02-10 ENCOUNTER — Telehealth: Payer: Self-pay

## 2015-02-10 NOTE — Telephone Encounter (Signed)
Notes Recorded by Serita Kyle, Rad Tech on 02/10/2015 at 11:27 AM Left detailed VM with information. Asked pt to call back if having continued symptoms. Notes Recorded by [name] on 02/09/2015 at 11:25 AM Left message on pt's machine to call back Notes Recorded by Glenford Bayley, DO on 02/08/2015 at 2:02 PM Please let her know the protues Was sensitive to cipro, does she still have any sxs?

## 2015-02-22 ENCOUNTER — Other Ambulatory Visit: Payer: Self-pay | Admitting: Family Medicine

## 2015-06-22 ENCOUNTER — Other Ambulatory Visit: Payer: Self-pay

## 2015-06-22 DIAGNOSIS — Z1231 Encounter for screening mammogram for malignant neoplasm of breast: Secondary | ICD-10-CM

## 2015-08-01 ENCOUNTER — Ambulatory Visit
Admission: RE | Admit: 2015-08-01 | Discharge: 2015-08-01 | Disposition: A | Discharge status: Home / Self Care | Payer: BC Managed Care – PPO | Source: Ambulatory Visit

## 2015-08-01 DIAGNOSIS — Z1231 Encounter for screening mammogram for malignant neoplasm of breast: Secondary | ICD-10-CM

## 2015-12-06 ENCOUNTER — Ambulatory Visit (HOSPITAL_COMMUNITY)
Admission: EM | Admit: 2015-12-06 | Discharge: 2015-12-06 | Disposition: A | Discharge status: Home / Self Care | Payer: BC Managed Care – PPO | Source: Home / Self Care

## 2015-12-06 ENCOUNTER — Encounter (HOSPITAL_COMMUNITY): Payer: Self-pay | Admitting: Emergency Medicine

## 2015-12-06 ENCOUNTER — Emergency Department (HOSPITAL_COMMUNITY): Payer: BC Managed Care – PPO

## 2015-12-06 ENCOUNTER — Emergency Department (HOSPITAL_COMMUNITY)
Admission: EM | Admit: 2015-12-06 | Discharge: 2015-12-06 | Disposition: A | Discharge status: Home / Self Care | Payer: BC Managed Care – PPO | Attending: Emergency Medicine | Admitting: Emergency Medicine

## 2015-12-06 DIAGNOSIS — Z7982 Long term (current) use of aspirin: Secondary | ICD-10-CM | POA: Insufficient documentation

## 2015-12-06 DIAGNOSIS — F17200 Nicotine dependence, unspecified, uncomplicated: Secondary | ICD-10-CM | POA: Diagnosis not present

## 2015-12-06 DIAGNOSIS — R1084 Generalized abdominal pain: Secondary | ICD-10-CM | POA: Diagnosis not present

## 2015-12-06 DIAGNOSIS — R1111 Vomiting without nausea: Secondary | ICD-10-CM | POA: Insufficient documentation

## 2015-12-06 DIAGNOSIS — J449 Chronic obstructive pulmonary disease, unspecified: Secondary | ICD-10-CM | POA: Diagnosis not present

## 2015-12-06 DIAGNOSIS — K5720 Diverticulitis of large intestine with perforation and abscess without bleeding: Secondary | ICD-10-CM

## 2015-12-06 DIAGNOSIS — R197 Diarrhea, unspecified: Secondary | ICD-10-CM | POA: Diagnosis not present

## 2015-12-06 DIAGNOSIS — Z79899 Other long term (current) drug therapy: Secondary | ICD-10-CM | POA: Insufficient documentation

## 2015-12-06 DIAGNOSIS — R112 Nausea with vomiting, unspecified: Secondary | ICD-10-CM

## 2015-12-06 DIAGNOSIS — R140 Abdominal distension (gaseous): Secondary | ICD-10-CM | POA: Diagnosis present

## 2015-12-06 HISTORY — DX: Diverticulitis of intestine, part unspecified, without perforation or abscess without bleeding: K57.92

## 2015-12-06 LAB — COMPREHENSIVE METABOLIC PANEL
ALT: 24 U/L (ref 14–54)
ANION GAP: 11 (ref 5–15)
AST: 46 U/L — ABNORMAL HIGH (ref 15–41)
Albumin: 4.5 g/dL (ref 3.5–5.0)
Alkaline Phosphatase: 79 U/L (ref 38–126)
BUN: 6 mg/dL (ref 6–20)
CO2: 25 mmol/L (ref 22–32)
Calcium: 8.9 mg/dL (ref 8.9–10.3)
Chloride: 98 mmol/L — ABNORMAL LOW (ref 101–111)
Creatinine, Ser: 0.65 mg/dL (ref 0.44–1.00)
GFR calc non Af Amer: >60 mL/min (ref >60)
Glucose, Bld: 106 mg/dL — ABNORMAL HIGH (ref 65–99)
Potassium: 3.7 mmol/L (ref 3.5–5.1)
SODIUM: 134 mmol/L — AB (ref 135–145)
Total Bilirubin: 0.9 mg/dL (ref 0.3–1.2)
Total Protein: 7.7 g/dL (ref 6.5–8.1)

## 2015-12-06 LAB — CBC
HCT: 45.8 % (ref 36.0–46.0)
HEMOGLOBIN: 15.3 g/dL — AB (ref 12.0–15.0)
MCH: 31.8 pg (ref 26.0–34.0)
MCHC: 33.4 g/dL (ref 30.0–36.0)
MCV: 95.2 fL (ref 78.0–100.0)
Platelets: 225 10*3/uL (ref 150–400)
RBC: 4.81 MIL/uL (ref 3.87–5.11)
RDW: 13.1 % (ref 11.5–15.5)
WBC: 8.6 10*3/uL (ref 4.0–10.5)

## 2015-12-06 LAB — URINALYSIS, ROUTINE W REFLEX MICROSCOPIC
BILIRUBIN URINE: NEGATIVE
GLUCOSE, UA: NEGATIVE mg/dL
Hgb urine dipstick: NEGATIVE
Ketones, ur: NEGATIVE mg/dL
Leukocytes, UA: NEGATIVE
Nitrite: NEGATIVE
PH: 5.5 (ref 5.0–8.0)
Protein, ur: NEGATIVE mg/dL
SPECIFIC GRAVITY, URINE: 1.01 (ref 1.005–1.030)

## 2015-12-06 LAB — LIPASE, BLOOD: Lipase: 22 U/L (ref 11–51)

## 2015-12-06 MED ORDER — ONDANSETRON HCL 4 MG/2ML IJ SOLN
4.0000 mg | Freq: Once | INTRAMUSCULAR | Status: AC
  Administered 2015-12-06: 4 mg via INTRAVENOUS
  Filled 2015-12-06: qty 2

## 2015-12-06 MED ORDER — SODIUM CHLORIDE 0.9 % IV BOLUS (SEPSIS)
1000.0000 mL | Freq: Once | INTRAVENOUS | Status: AC
  Administered 2015-12-06: 1000 mL via INTRAVENOUS

## 2015-12-06 MED ORDER — MORPHINE SULFATE (PF) 4 MG/ML IV SOLN
4.0000 mg | Freq: Once | INTRAVENOUS | Status: AC
Start: 2015-12-06 — End: 2015-12-06
  Administered 2015-12-06: 4 mg via INTRAVENOUS
  Filled 2015-12-06: qty 1

## 2015-12-06 MED ORDER — ONDANSETRON 4 MG PO TBDP: 4.0000 mg | ORAL_TABLET | Freq: Three times a day (TID) | ORAL | 0 refills | Status: DC | PRN

## 2015-12-06 MED ORDER — IOPAMIDOL (ISOVUE-300) INJECTION 61%
INTRAVENOUS | Status: AC
  Administered 2015-12-06: 100 mL
  Filled 2015-12-06: qty 100

## 2015-12-06 NOTE — ED Provider Notes (Signed)
[place]    CSN: [place]:[id number] Arrival date & time: 12/06/15  1046  First Provider Contact:  First MD Initiated Contact with Patient 12/06/15 1222        History   Chief Complaint Chief Complaint  Patient presents with  . Abdominal Pain    HPI Vicki [name] is a 50 y.o. female.   The history is provided by the patient and a parent.  Abdominal Pain  Pain location:  LLQ Pain quality: cramping and fullness   Pain radiates to:  Does not radiate Pain severity:  Moderate Progression:  Worsening Chronicity:  Recurrent (sx started 2 wks ago but worse yest with onset of vomiting around 2am with blood  specs.seen in vomitus and diarrhea.) Context: diet changes   Context comment:  Dx with divert last yr, poor po for 2 days. Associated symptoms: diarrhea, nausea and vomiting   Associated symptoms: no fever     Past Medical History:  Diagnosis Date  . Asthma   . Carpal tunnel syndrome   . COPD (chronic obstructive pulmonary disease) (HCC)     There are no active problems to display for this patient.   Past Surgical History:  Procedure Laterality Date  . ABDOMINAL HYSTERECTOMY  2009  . APPENDECTOMY    . ARTHROSCOPIC REPAIR ACL Left 2011   x 2  . BUNIONECTOMY  2006   both  . CARPAL TUNNEL RELEASE Bilateral 10/28/2013   Procedure: BILATERAL CARPAL TUNNEL RELEASE;  Surgeon: Wynonia Sours, MD;  Location: [place];  Service: Orthopedics;  Laterality: Bilateral;  . DILATION AND CURETTAGE OF UTERUS    . TUBAL LIGATION  2004    OB History    No data available       Home Medications    Prior to Admission medications   Medication Sig Start Date End Date Taking? Authorizing Provider  albuterol (PROVENTIL HFA;VENTOLIN HFA) 108 (90 BASE) MCG/ACT inhaler Inhale into the lungs every 6 (six) hours as needed for wheezing or shortness of breath.    Historical Provider, MD  ALPRAZolam Duanne Moron) 1 MG tablet Take 0.5-1 mg by mouth daily as needed for  anxiety or sleep.     Historical Provider, MD  aspirin 81 MG tablet Take 81 mg by mouth daily.    Historical Provider, MD  ciprofloxacin (CIPRO) 500 MG tablet Take 1 tablet (500 mg total) by mouth 2 (two) times daily. 02/02/15   Thao P Le, DO  citalopram (CELEXA) 40 MG tablet Take 60 mg by mouth daily.    Historical Provider, MD  cyclobenzaprine (FLEXERIL) 5 MG tablet Take 1 tablet (5 mg total) by mouth 3 (three) times daily as needed for muscle spasms. 02/02/15   Thao P Le, DO  eletriptan (RELPAX) 40 MG tablet Take 40 mg by mouth as needed for migraine or headache. One tablet by mouth at onset of headache. May repeat in 2 hours if headache persists or recurs.    Historical Provider, MD  Fluticasone-Salmeterol (ADVAIR) 250-50 MCG/DOSE AEPB Inhale 1 puff into the lungs 2 (two) times daily.    Historical Provider, MD  ibuprofen (ADVIL,MOTRIN) 800 MG tablet Take 800 mg by mouth every 8 (eight) hours as needed for mild pain.    Historical Provider, MD  meloxicam (MOBIC) 7.5 MG tablet Take 1 tablet (7.5 mg total) by mouth 2 (two) times daily as needed for pain. Take with food, no other NSAIDs 02/02/15   Thao P Le, DO  Multiple Vitamin (MULTIVITAMIN WITH  MINERALS) TABS tablet Take 1 tablet by mouth daily.    Historical Provider, MD  rOPINIRole (REQUIP) 1 MG tablet Take 1 mg by mouth 3 (three) times daily.    Historical Provider, MD    Family History Family History  Problem Relation Age of Onset  . Cancer Mother   . Cancer Sister     Social History Social History  Substance Use Topics  . Smoking status: Current Every Day Smoker    Packs/day: 0.50  . Smokeless tobacco: Never Used  . Alcohol use Yes     Comment: occ     Allergies   Jasmine oil; Penicillins; Aleve [naproxen sodium]; Benadryl [diphenhydramine]; Ceftin [cefuroxime axetil]; Depo-provera [medroxyprogesterone acetate]; Oxycodone; and Tramadol   Review of Systems Review of Systems  Constitutional: Positive for appetite change.  Negative for fever.  Gastrointestinal: Positive for abdominal pain, diarrhea, nausea and vomiting.  All other systems reviewed and are negative.    Physical Exam Triage Vital Signs ED Triage Vitals  Enc Vitals Group     BP 12/06/15 1209 147/99     Pulse Rate 12/06/15 1209 87     Resp 12/06/15 1209 16     Temp 12/06/15 1209 98.4 F (36.9 C)     Temp Source 12/06/15 1209 Oral     SpO2 12/06/15 1209 97 %     Weight --      Height --      Head Circumference --      Peak Flow --      Pain Score 12/06/15 1211 7     Pain Loc --      Pain Edu? --      Excl. in [place]? --    No data found.   Updated Vital Signs BP 147/99   Pulse 87   Temp 98.4 F (36.9 C) (Oral)   Resp 16   SpO2 97%   Visual Acuity Right Eye Distance:   Left Eye Distance:   Bilateral Distance:    Right Eye Near:   Left Eye Near:    Bilateral Near:     Physical Exam  Constitutional: She is oriented to person, place, and time. She appears well-developed. No distress.  HENT:  Mouth/Throat: Mucous membranes are dry.  Eyes: Pupils are equal, round, and reactive to light.  Neck: Normal range of motion.  Abdominal: Soft. Normal appearance. Bowel sounds are decreased. There is tenderness in the suprapubic area and left lower quadrant. There is guarding.    Lymphadenopathy:    She has no cervical adenopathy.  Neurological: She is alert and oriented to person, place, and time.  Skin: Skin is warm and dry.  Nursing note and vitals reviewed.    UC Treatments / Results  Labs (all labs ordered are listed, but only abnormal results are displayed) Labs Reviewed - No data to display  EKG  EKG Interpretation None       Radiology No results found.  Procedures Procedures (including critical care time)  Medications Ordered in UC Medications - No data to display   Initial Impression / Assessment and Plan / UC Course  I have reviewed the triage vital signs and the nursing notes.  Pertinent labs &  imaging results that were available during my care of the patient were reviewed by me and considered in my medical decision making (see chart for details).  Clinical Course  sent for eval of divert with poor po and worsening sx for 2wks, vomiting this am , blood seen.  Final Clinical Impressions(s) / UC Diagnoses   Final diagnoses:  Diverticulitis of large intestine with abscess without bleeding    New Prescriptions New Prescriptions   No medications on file     Billy Fischer, MD 12/20/15 2044

## 2015-12-06 NOTE — ED Triage Notes (Addendum)
PT has a history of diverticulitis. PT reports lower abdominal pain, diarrhea, and nausea for two weeks. PT vomited for the first time this AM. PT also notes red blood in diarrhea and notes that diarrhea is black.

## 2015-12-06 NOTE — ED Provider Notes (Signed)
[facility] DEPT Provider Note   CSN: [id number] Arrival date & time: 12/06/15  1259  First Provider Contact:  First MD Initiated Contact with Patient 12/06/15 1540        History   Chief Complaint Chief Complaint  Patient presents with  . Abdominal Pain    HPI Vicki [name] is a 50 y.o. female.  HPI   Patient is a 50 year old female with a history of diverticulosis, COPD, asthma who presents the ED with progressivly worsening abdominal pain for 2 weeks. Pain is around the umbilicus and suprapubic region it is constant, nonradiating, 8/10, worse with eating. Associated nausea with eating, episodes of vomiting last night, scant watery diarrhea, intermittent subjective fevers. She states blood in her diarrhea the past 4 days. She also complaining of decreased urination but no dysuria or blood in her urine. One black bowel movement today. Patient has a history of 2 abdominal surgeries. She is tried [employer] for this without relief. She states this is like her previous episode of diverticulitis but much worse. Patient denies headache, dizziness, visual changes, syncope, hematuria, dysuria, vaginal discharge, back pain, chest pain, shortness of breath.  Past Medical History:  Diagnosis Date  . Asthma   . Carpal tunnel syndrome   . COPD (chronic obstructive pulmonary disease) ([place])   . Diverticulitis     There are no active problems to display for this patient.   Past Surgical History:  Procedure Laterality Date  . ABDOMINAL HYSTERECTOMY  2009  . APPENDECTOMY    . ARTHROSCOPIC REPAIR ACL Left 2011   x 2  . BUNIONECTOMY  2006   both  . CARPAL TUNNEL RELEASE Bilateral 10/28/2013   Procedure: BILATERAL CARPAL TUNNEL RELEASE;  Surgeon: Wynonia Sours, MD;  Location: [place];  Service: Orthopedics;  Laterality: Bilateral;  . DILATION AND CURETTAGE OF UTERUS    . TUBAL LIGATION  2004    OB History    No data available       Home Medications    Prior  to Admission medications   Medication Sig Start Date End Date Taking? Authorizing Provider  albuterol (PROVENTIL HFA;VENTOLIN HFA) 108 (90 BASE) MCG/ACT inhaler Inhale into the lungs every 6 (six) hours as needed for wheezing or shortness of breath.   Yes Historical Provider, MD  ALPRAZolam Duanne Moron) 1 MG tablet Take 0.5-1 mg by mouth daily as needed for anxiety or sleep.    Yes Historical Provider, MD  aspirin 81 MG tablet Take 81 mg by mouth daily.   Yes Historical Provider, MD  citalopram (CELEXA) 40 MG tablet Take 60 mg by mouth daily.   Yes Historical Provider, MD  eletriptan (RELPAX) 40 MG tablet Take 40 mg by mouth as needed for migraine or headache. One tablet by mouth at onset of headache. May repeat in 2 hours if headache persists or recurs.   Yes Historical Provider, MD  Fluticasone-Salmeterol (ADVAIR) 250-50 MCG/DOSE AEPB Inhale 1 puff into the lungs 2 (two) times daily.   Yes Historical Provider, MD  ibuprofen (ADVIL,MOTRIN) 800 MG tablet Take 800 mg by mouth every 8 (eight) hours as needed for mild pain.   Yes Historical Provider, MD  pravastatin (PRAVACHOL) 40 MG tablet Take 1 tablet by mouth daily. 09/28/15  Yes Historical Provider, MD  rOPINIRole (REQUIP) 1 MG tablet Take 1 mg by mouth 3 (three) times daily.   Yes Historical Provider, MD  traZODone (DESYREL) 50 MG tablet Take 1 tablet by mouth at bedtime. 11/16/15  Yes  Historical Provider, MD  ciprofloxacin (CIPRO) 500 MG tablet Take 1 tablet (500 mg total) by mouth 2 (two) times daily. 02/02/15   Thao P Le, DO  cyclobenzaprine (FLEXERIL) 5 MG tablet Take 1 tablet (5 mg total) by mouth 3 (three) times daily as needed for muscle spasms. 02/02/15   Thao P Le, DO  meloxicam (MOBIC) 7.5 MG tablet Take 1 tablet (7.5 mg total) by mouth 2 (two) times daily as needed for pain. Take with food, no other NSAIDs 02/02/15   Thao P Le, DO  ondansetron (ZOFRAN ODT) 4 MG disintegrating tablet Take 1 tablet (4 mg total) by mouth every 8 (eight) hours as  needed for nausea or vomiting. 12/06/15   Kalman Drape, PA    Family History Family History  Problem Relation Age of Onset  . Cancer Mother   . Cancer Sister     Social History Social History  Substance Use Topics  . Smoking status: Current Every Day Smoker    Packs/day: 0.50  . Smokeless tobacco: Never Used  . Alcohol use Yes     Comment: occ     Allergies   Jasmine oil; Penicillins; Aleve [naproxen sodium]; Benadryl [diphenhydramine]; Ceftin [cefuroxime axetil]; Depo-provera [medroxyprogesterone acetate]; Oxycodone; and Tramadol   Review of Systems Review of Systems  Constitutional: Positive for appetite change and fever.  HENT: Negative for trouble swallowing.   Eyes: Negative for visual disturbance.  Respiratory: Negative for chest tightness and shortness of breath.   Cardiovascular: Negative for chest pain and leg swelling.  Gastrointestinal: Positive for abdominal distention, abdominal pain, blood in stool, diarrhea, nausea and vomiting.  Genitourinary: Positive for decreased urine volume. Negative for dysuria, hematuria and vaginal discharge.  Musculoskeletal: Negative for neck pain and neck stiffness.  Skin: Negative for rash.  Neurological: Negative for dizziness, syncope, weakness, numbness and headaches.     Physical Exam Updated Vital Signs BP 111/77 (BP Location: Left Arm)   Pulse 89   Temp 98.1 F (36.7 C) (Oral)   Resp 22   SpO2 97%   Physical Exam  Constitutional: She appears well-developed and well-nourished. No distress.  Crying during the exam  HENT:  Head: Normocephalic and atraumatic.  Eyes: Conjunctivae are normal.  Neck: Normal range of motion. Neck supple.  Cardiovascular: Normal rate, regular rhythm and normal heart sounds.  Exam reveals no gallop and no friction rub.   No murmur heard. Pulses:      Radial pulses are 2+ on the right side, and 2+ on the left side.       Dorsalis pedis pulses are 2+ on the right side, and 2+ on the  left side.       Posterior tibial pulses are 2+ on the right side, and 2+ on the left side.  Pulmonary/Chest: Effort normal and breath sounds normal. No respiratory distress. She has no decreased breath sounds. She has no wheezes. She has no rhonchi. She has no rales.  Abdominal: Soft. Normal appearance. She exhibits distension. She exhibits no fluid wave. Bowel sounds are increased. There is generalized tenderness. There is no CVA tenderness and negative Murphy's sign.  Patient with generalized abdominal tenderness, pain and worsening epigastric, left lower quadrant and right lower quadrant, Rovsing positive  Musculoskeletal: Normal range of motion. She exhibits no edema.  Neurological: She is alert.  Skin: Skin is warm and dry. No rash noted. She is not diaphoretic.  Psychiatric: She has a normal mood and affect. Her behavior is normal.  Nursing note  and vitals reviewed.    ED Treatments / Results  Labs (all labs ordered are listed, but only abnormal results are displayed) Labs Reviewed  COMPREHENSIVE METABOLIC PANEL - Abnormal; Notable for the following:       Result Value   Sodium 134 (*)    Chloride 98 (*)    Glucose, Bld 106 (*)    AST 46 (*)    All other components within normal limits  CBC - Abnormal; Notable for the following:    Hemoglobin 15.3 (*)    All other components within normal limits  URINALYSIS, ROUTINE W REFLEX MICROSCOPIC (NOT AT [facility])  LIPASE, BLOOD    EKG  EKG Interpretation None       Radiology Ct Abdomen Pelvis W Contrast  Result Date: 12/06/2015 CLINICAL DATA:  Umbilical pain for 2 weeks, initial encounter EXAM: CT ABDOMEN AND PELVIS WITH CONTRAST TECHNIQUE: Multidetector CT imaging of the abdomen and pelvis was performed using the standard protocol following bolus administration of intravenous contrast. CONTRAST:  196mL ISOVUE-300 IOPAMIDOL (ISOVUE-300) INJECTION 61% COMPARISON:  04/26/2014 FINDINGS: Lower chest:  No acute findings. Hepatobiliary:  The liver is diffusely fatty infiltrated. The gallbladder has been surgically removed. Pancreas: No mass, inflammatory changes, or other significant abnormality. Spleen: Within normal limits in size and appearance. Adrenals/Urinary Tract: No masses identified. No evidence of hydronephrosis. Bladder is well distended without focal abnormality. Stomach/Bowel: The appendix is within normal limits. Diverticular changes noted within the sigmoid colon. Vascular/Lymphatic: No pathologically enlarged lymph nodes. No evidence of abdominal aortic aneurysm. Reproductive: Uterus has been surgically removed. Other: None. Musculoskeletal:  No suspicious bone lesions identified. IMPRESSION: Diverticulosis without diverticulitis. Fatty liver. No other focal abnormality is noted. Electronically Signed   By: Inez Catalina M.D.   On: 12/06/2015 17:18   Procedures Procedures (including critical care time)  Medications Ordered in ED Medications  sodium chloride 0.9 % bolus 1,000 mL (0 mLs Intravenous Stopped 12/06/15 1824)  morphine 4 MG/ML injection 4 mg (4 mg Intravenous Given 12/06/15 1637)  ondansetron (ZOFRAN) injection 4 mg (4 mg Intravenous Given 12/06/15 1636)  iopamidol (ISOVUE-300) 61 % injection (100 mLs  Contrast Given 12/06/15 1645)  ondansetron (ZOFRAN) injection 4 mg (4 mg Intravenous Given 12/06/15 1824)     Initial Impression / Assessment and Plan / ED Course  I have reviewed the triage vital signs and the nursing notes.  Pertinent labs & imaging results that were available during my care of the patient were reviewed by me and considered in my medical decision making (see chart for details).  Clinical Course  Value Comment By Time  BUN: 6 (Reviewed) Kalman Drape, PA 07/25 1945   7:41 PM Pt states her nausea has resolved and her abd pain has improved. Now about a 3/10. Only mild tenderness on exam. Pt able to tolerate food and liquids PO while in the ED without emesis.    Final Clinical  Impressions(s) / ED Diagnoses   Final diagnoses:  Generalized abdominal pain  Nausea vomiting and diarrhea   Patient is nontoxic, nonseptic appearing, in no apparent distress.  Patient's pain and other symptoms adequately managed in emergency department.  Fluid bolus given.  Labs, imaging and vitals reviewed.  Patient does not meet the SIRS or Sepsis criteria.  On repeat exam patient does not have a surgical abdomin and there are no peritoneal signs.  No indication of appendicitis, bowel obstruction, bowel perforation, cholecystitis, diverticulitis, PID or ectopic pregnancy (pt had hysterectomy) or ovarian etiology. This is  likely a gastritis or colitis. Pt labs unremarkable and pt afebrile.   Patient discharged home with symptomatic treatment and given strict instructions for follow-up with their primary care physician within 2 days.  I have also discussed reasons to return immediately to the ER.  Patient expresses understanding and agrees with plan.  Case discussed with Dr. Tamera Punt who agrees with the above plan.   New Prescriptions Discharge Medication List as of 12/06/2015  6:30 PM    START taking these medications   Details  ondansetron (ZOFRAN ODT) 4 MG disintegrating tablet Take 1 tablet (4 mg total) by mouth every 8 (eight) hours as needed for nausea or vomiting., Starting Tue 12/06/2015, [place], [place] 12/06/15 1948    Malvin Johns, MD 12/06/15 2356

## 2015-12-06 NOTE — ED Notes (Signed)
Family at bedside. 

## 2015-12-06 NOTE — ED Notes (Signed)
Report given to Caryl Pina, RN ED nurse first.

## 2015-12-06 NOTE — Discharge Instructions (Signed)
Your CT scan was negative for diverticulitis or appendicitis. Follow-up with your primary care provider within 2 days to be reevaluated. Take Tylenol as needed for pain. Be sure to drink plenty of water and follow a bland diet for 2 days and advance your diet as tolerated. Return to the emergency department if your symptoms worsen or you experience blood in your vomit, fever, dizziness, chest pain, shortness of breath, headache or any other concerning symptoms.

## 2015-12-06 NOTE — ED Notes (Signed)
Pt A&ox4, ambulatory at d/c with steady gait, NAD 

## 2015-12-06 NOTE — ED Triage Notes (Signed)
Pt here for abd pain x 2 weeks worse over last 4 days with differen BM: pt sts hx of diverticulitis and feels same

## 2016-03-14 ENCOUNTER — Ambulatory Visit (HOSPITAL_COMMUNITY)
Admission: EM | Admit: 2016-03-14 | Discharge: 2016-03-14 | Disposition: A | Discharge status: Home / Self Care | Payer: BC Managed Care – PPO | Attending: Family Medicine | Admitting: Family Medicine

## 2016-03-14 ENCOUNTER — Encounter (HOSPITAL_COMMUNITY): Payer: Self-pay | Admitting: *Deleted

## 2016-03-14 DIAGNOSIS — Z720 Tobacco use: Secondary | ICD-10-CM

## 2016-03-14 DIAGNOSIS — J441 Chronic obstructive pulmonary disease with (acute) exacerbation: Secondary | ICD-10-CM | POA: Diagnosis not present

## 2016-03-14 DIAGNOSIS — J9801 Acute bronchospasm: Secondary | ICD-10-CM | POA: Diagnosis not present

## 2016-03-14 DIAGNOSIS — R0982 Postnasal drip: Secondary | ICD-10-CM | POA: Diagnosis not present

## 2016-03-14 DIAGNOSIS — J069 Acute upper respiratory infection, unspecified: Secondary | ICD-10-CM | POA: Diagnosis not present

## 2016-03-14 MED ORDER — DEXAMETHASONE SODIUM PHOSPHATE 10 MG/ML IJ SOLN
10.0000 mg | Freq: Once | INTRAMUSCULAR | Status: AC
  Administered 2016-03-14: 10 mg via INTRAMUSCULAR

## 2016-03-14 MED ORDER — METHYLPREDNISOLONE ACETATE 80 MG/ML IJ SUSP
INTRAMUSCULAR | Status: AC
  Filled 2016-03-14: qty 1

## 2016-03-14 MED ORDER — IPRATROPIUM-ALBUTEROL 0.5-2.5 (3) MG/3ML IN SOLN
3.0000 mL | Freq: Once | RESPIRATORY_TRACT | Status: AC
  Administered 2016-03-14: 3 mL via RESPIRATORY_TRACT

## 2016-03-14 MED ORDER — IPRATROPIUM-ALBUTEROL 0.5-2.5 (3) MG/3ML IN SOLN
RESPIRATORY_TRACT | Status: AC
  Filled 2016-03-14: qty 3

## 2016-03-14 MED ORDER — ALBUTEROL SULFATE HFA 108 (90 BASE) MCG/ACT IN AERS: 2.0000 | INHALATION_SPRAY | RESPIRATORY_TRACT | 0 refills | Status: DC | PRN

## 2016-03-14 MED ORDER — METHYLPREDNISOLONE ACETATE 40 MG/ML IJ SUSP
40.0000 mg | Freq: Once | INTRAMUSCULAR | Status: AC
  Administered 2016-03-14: 40 mg via INTRAMUSCULAR

## 2016-03-14 MED ORDER — PREDNISONE 50 MG PO TABS: ORAL_TABLET | ORAL | 0 refills | Status: DC

## 2016-03-14 MED ORDER — DEXAMETHASONE SODIUM PHOSPHATE 10 MG/ML IJ SOLN
INTRAMUSCULAR | Status: AC
  Filled 2016-03-14: qty 1

## 2016-03-14 NOTE — Discharge Instructions (Addendum)
For nasal and head congestion may take Sudafed PE 10 mg every 4 hours as needed. Saline nasal spray used frequently. For drainage may use Allegra, Claritin or Zyrtec. If you need stronger medicine to stop drainage may take Chlor-Trimeton 2-4 mg every 4 hours. This may cause drowsiness. Ibuprofen 400 mg every 6 hours as needed for pain, discomfort or fever. Drink plenty of fluids and stay well-hydrated.

## 2016-03-14 NOTE — ED Triage Notes (Signed)
Cough  And    Congested  X  4-5  Days  Ago  Pt  Reports   Symptoms     Not  Improved  by  The  Anti  Biotics  Called  In

## 2016-03-14 NOTE — ED Provider Notes (Signed)
CSN: [id number]     Arrival date & time 03/14/16  1250 History   First MD Initiated Contact with Patient 03/14/16 1430     Chief Complaint  Patient presents with  . Cough   (Consider location/radiation/quality/duration/timing/severity/associated sxs/prior Treatment) 50 year old obese female with a history of COPD and smokes one half pack per day states she has had a cough and coughing spasms for the past 4-5 days. She called her PCP and told her that she thought she had a sinus infection and was prescribed doxycycline over the phone. She states it is not getting better. Today she is complaining of cough, change in her voice, headache, facial pain and PND. She is using albuterol HFA twice a day. Shelly so uses Advair twice a day.      Past Medical History:  Diagnosis Date  . Asthma   . Carpal tunnel syndrome   . COPD (chronic obstructive pulmonary disease) ([place])   . Diverticulitis    Past Surgical History:  Procedure Laterality Date  . ABDOMINAL HYSTERECTOMY  2009  . APPENDECTOMY    . ARTHROSCOPIC REPAIR ACL Left 2011   x 2  . BUNIONECTOMY  2006   both  . CARPAL TUNNEL RELEASE Bilateral 10/28/2013   Procedure: BILATERAL CARPAL TUNNEL RELEASE;  Surgeon: Wynonia Sours, MD;  Location: [facility];  Service: Orthopedics;  Laterality: Bilateral;  . DILATION AND CURETTAGE OF UTERUS    . TUBAL LIGATION  2004   Family History  Problem Relation Age of Onset  . Cancer Mother   . Cancer Sister    Social History  Substance Use Topics  . Smoking status: Current Every Day Smoker    Packs/day: 0.50  . Smokeless tobacco: Never Used  . Alcohol use Yes     Comment: occ   OB History    No data available     Review of Systems  Constitutional: Positive for activity change. Negative for appetite change, chills, fatigue and fever.  HENT: Positive for congestion, postnasal drip, rhinorrhea and sinus pressure. Negative for facial swelling and trouble swallowing.   Eyes:  Negative.   Respiratory: Positive for cough and shortness of breath.   Cardiovascular: Negative.  Negative for chest pain.  Gastrointestinal: Negative.   Musculoskeletal: Negative for neck pain and neck stiffness.  Skin: Negative for pallor and rash.  Neurological: Positive for headaches.  All other systems reviewed and are negative.   Allergies  Jasmine oil; Penicillins; Aleve [naproxen sodium]; Benadryl [diphenhydramine]; Ceftin [cefuroxime axetil]; Depo-provera [medroxyprogesterone acetate]; Oxycodone; and Tramadol  Home Medications   Prior to Admission medications   Medication Sig Start Date End Date Taking? Authorizing Provider  albuterol (PROVENTIL HFA;VENTOLIN HFA) 108 (90 BASE) MCG/ACT inhaler Inhale into the lungs every 6 (six) hours as needed for wheezing or shortness of breath.    Historical Provider, MD  ALPRAZolam Duanne Moron) 1 MG tablet Take 0.5-1 mg by mouth daily as needed for anxiety or sleep.     Historical Provider, MD  aspirin 81 MG tablet Take 81 mg by mouth daily.    Historical Provider, MD  ciprofloxacin (CIPRO) 500 MG tablet Take 1 tablet (500 mg total) by mouth 2 (two) times daily. 02/02/15   Thao P Le, DO  citalopram (CELEXA) 40 MG tablet Take 60 mg by mouth daily.    Historical Provider, MD  cyclobenzaprine (FLEXERIL) 5 MG tablet Take 1 tablet (5 mg total) by mouth 3 (three) times daily as needed for muscle spasms. 02/02/15  Thao P Le, DO  eletriptan (RELPAX) 40 MG tablet Take 40 mg by mouth as needed for migraine or headache. One tablet by mouth at onset of headache. May repeat in 2 hours if headache persists or recurs.    Historical Provider, MD  Fluticasone-Salmeterol (ADVAIR) 250-50 MCG/DOSE AEPB Inhale 1 puff into the lungs 2 (two) times daily.    Historical Provider, MD  ibuprofen (ADVIL,MOTRIN) 800 MG tablet Take 800 mg by mouth every 8 (eight) hours as needed for mild pain.    Historical Provider, MD  meloxicam (MOBIC) 7.5 MG tablet Take 1 tablet (7.5 mg total)  by mouth 2 (two) times daily as needed for pain. Take with food, no other NSAIDs 02/02/15   Thao P Le, DO  ondansetron (ZOFRAN ODT) 4 MG disintegrating tablet Take 1 tablet (4 mg total) by mouth every 8 (eight) hours as needed for nausea or vomiting. 12/06/15   Kalman Drape, PA  pravastatin (PRAVACHOL) 40 MG tablet Take 1 tablet by mouth daily. 09/28/15   Historical Provider, MD  predniSONE (DELTASONE) 50 MG tablet One po daily for 6 days. Take with food. Start 03/15/2016. 03/14/16   Janne Napoleon, NP  rOPINIRole (REQUIP) 1 MG tablet Take 1 mg by mouth 3 (three) times daily.    Historical Provider, MD  traZODone (DESYREL) 50 MG tablet Take 1 tablet by mouth at bedtime. 11/16/15   Historical Provider, MD   Meds Ordered and Administered this Visit   Medications  ipratropium-albuterol (DUONEB) 0.5-2.5 (3) MG/3ML nebulizer solution 3 mL (3 mLs Nebulization Given 03/14/16 1451)  dexamethasone (DECADRON) injection 10 mg (10 mg Intramuscular Given 03/14/16 1451)  methylPREDNISolone acetate (DEPO-MEDROL) injection 40 mg (40 mg Intramuscular Given 03/14/16 1451)    BP 147/96 (BP Location: Left Arm)   Pulse 101   Temp 98.1 F (36.7 C) (Oral)   Resp 14   SpO2 97%  No data found.   Physical Exam  Constitutional: She is oriented to person, place, and time. She appears well-developed and well-nourished. No distress.  HENT:  Head: Normocephalic and atraumatic.  Right Ear: External ear normal.  Left Ear: External ear normal.  Mouth/Throat: No oropharyngeal exudate.  Bilateral TMs are normal. Oropharynx with minor erythema and moderate amount of clear PND and light cobblestoning. No exudates or swelling.  Eyes: EOM are normal.  Neck: Normal range of motion. Neck supple.  Cardiovascular: Normal rate, regular rhythm, normal heart sounds and intact distal pulses.   Pulmonary/Chest: Effort normal. No respiratory distress. She has wheezes.  Diffuse bilateral expiratory wheezes. Decreased air movement, prolonged  expiratory phase and coughing spasms associated with deep inspiration.  Musculoskeletal: Normal range of motion. She exhibits no edema.  Lymphadenopathy:    She has no cervical adenopathy.  Neurological: She is alert and oriented to person, place, and time.  Skin: Skin is warm and dry.  Psychiatric: She has a normal mood and affect.  Nursing note and vitals reviewed.   Urgent Care Course   Clinical Course    Procedures (including critical care time)  Labs Review Labs Reviewed - No data to display  Imaging Review No results found.   Visual Acuity Review  Right Eye Distance:   Left Eye Distance:   Bilateral Distance:    Right Eye Near:   Left Eye Near:    Bilateral Near:         MDM   1. Acute upper respiratory infection   2. COPD exacerbation ([place])   3. Bronchospasm  4. Cough due to bronchospasm   5. PND (post-nasal drip)   6. Tobacco abuse disorder   Post neb there is much improved air movement and decrease in wheeze. Patient states she can breathe better and feels better even with her facial pressure. For nasal and head congestion may take Sudafed PE 10 mg every 4 hours as needed. Saline nasal spray used frequently. For drainage may use Allegra, Claritin or Zyrtec. If you need stronger medicine to stop drainage may take Chlor-Trimeton 2-4 mg every 4 hours. This may cause drowsiness. Ibuprofen 400 mg every 6 hours as needed for pain, discomfort or fever. Drink plenty of fluids and stay well-hydrated.  Albuterol HFA 2 puffs q4h prn Meds ordered this encounter  Medications  . ipratropium-albuterol (DUONEB) 0.5-2.5 (3) MG/3ML nebulizer solution 3 mL  . dexamethasone (DECADRON) injection 10 mg  . methylPREDNISolone acetate (DEPO-MEDROL) injection 40 mg  . predniSONE (DELTASONE) 50 MG tablet    Sig: One po daily for 6 days. Take with food. Start 03/15/2016.    Dispense:  6 tablet    Refill:  0    Order Specific Question:   Supervising Provider    Answer:    Billy Fischer [[id number]]       Janne Napoleon, NP 03/14/16 1536

## 2016-09-10 ENCOUNTER — Other Ambulatory Visit: Payer: Self-pay | Admitting: Family Medicine

## 2016-09-10 DIAGNOSIS — Z1231 Encounter for screening mammogram for malignant neoplasm of breast: Secondary | ICD-10-CM

## 2016-09-27 ENCOUNTER — Ambulatory Visit: Payer: BC Managed Care – PPO

## 2016-10-12 ENCOUNTER — Inpatient Hospital Stay: Admission: RE | Admit: 2016-10-12 | Payer: BC Managed Care – PPO | Source: Ambulatory Visit

## 2016-10-17 ENCOUNTER — Encounter: Payer: Self-pay | Admitting: Physician Assistant

## 2016-10-17 LAB — LIPID PANEL: Cholesterol: 282 — AB (ref 0–200)

## 2016-10-22 ENCOUNTER — Encounter: Payer: Self-pay | Admitting: Physician Assistant

## 2016-10-22 ENCOUNTER — Ambulatory Visit (INDEPENDENT_AMBULATORY_CARE_PROVIDER_SITE_OTHER): Payer: BC Managed Care – PPO

## 2016-10-22 ENCOUNTER — Ambulatory Visit (INDEPENDENT_AMBULATORY_CARE_PROVIDER_SITE_OTHER): Payer: BC Managed Care – PPO | Admitting: Physician Assistant

## 2016-10-22 VITALS — BP 115/80 | HR 90 | Temp 98.4°F | Resp 18 | Ht 63.5 in | Wt 140.8 lb

## 2016-10-22 DIAGNOSIS — M5136 Other intervertebral disc degeneration, lumbar region: Secondary | ICD-10-CM

## 2016-10-22 DIAGNOSIS — M5441 Lumbago with sciatica, right side: Secondary | ICD-10-CM

## 2016-10-22 DIAGNOSIS — G8929 Other chronic pain: Secondary | ICD-10-CM

## 2016-10-22 MED ORDER — PREDNISONE 20 MG PO TABS: ORAL_TABLET | ORAL | 0 refills | Status: DC

## 2016-10-22 MED ORDER — MELOXICAM 15 MG PO TABS: 15.0000 mg | ORAL_TABLET | Freq: Every day | ORAL | 0 refills | Status: DC

## 2016-10-22 MED ORDER — PROMETHAZINE HCL 25 MG/ML IJ SOLN
25.0000 mg | Freq: Once | INTRAMUSCULAR | Status: AC
  Administered 2016-10-22: 25 mg via INTRAMUSCULAR

## 2016-10-22 MED ORDER — KETOROLAC TROMETHAMINE 30 MG/ML IJ SOLN
30.0000 mg | Freq: Once | INTRAMUSCULAR | Status: AC
  Administered 2016-10-22: 30 mg via INTRAMUSCULAR

## 2016-10-22 MED ORDER — CYCLOBENZAPRINE HCL 10 MG PO TABS: 10.0000 mg | ORAL_TABLET | Freq: Three times a day (TID) | ORAL | 0 refills | Status: DC | PRN

## 2016-10-22 NOTE — Progress Notes (Signed)
Patient ID: Vicki [name], female    DOB: 11-17-1965, 51 y.o.   MRN: [id number]  PCP: Maury Dus, MD  Chief Complaint  Patient presents with  . Back Pain    lower back pain, x2 weeks, per PT woke up yesterday and pain was worse and the pain is going down right leg.  . Dysuria    Subjective:   Presents for evaluation of low back pain.  Constant pain (4/10) for the past 6 months, really bad for the past 2 weeks. Using ibuprofen 800 mg, with inadequate relief. Her PCP recommended stretching about 2 weeks ago, when she was there for routine evaluation.  Doing water exercise.  History of locked up rib cage, saw Dr. Merri Ray, chiropractor, with excellent relief.  2 weeks ago, lifting 40 lbs, pain worsened to 5-10/10. Yesterday after getting out of the pool, the pain started radiating down the RIGHT leg and up the back into the neck. Ibuprofen isn't helping at all. No position is comfortable. RIGHT side lying with heating pad helped a little this morning-she was able to sleep, but may have burned her side.  No loss of B/B control. No saddle anesthesia. RIGHT hip feels weak, due to pain. No giving way.   Review of Systems As above.    There are no active problems to display for this patient.    Prior to Admission medications   Medication Sig Start Date End Date Taking? Authorizing Provider  albuterol (PROVENTIL HFA;VENTOLIN HFA) 108 (90 Base) MCG/ACT inhaler Inhale 2 puffs into the lungs every 4 (four) hours as needed for wheezing or shortness of breath. 03/14/16  Yes Mabe, Shanon Brow, NP  ALPRAZolam Duanne Moron) 1 MG tablet Take 0.5-1 mg by mouth daily as needed for anxiety or sleep.    Yes [provider]  aspirin 81 MG tablet Take 81 mg by mouth daily.   Yes [provider]  citalopram (CELEXA) 40 MG tablet Take 60 mg by mouth daily.   Yes [provider]  cyclobenzaprine (FLEXERIL) 5 MG tablet Take 1 tablet (5 mg total) by mouth 3 (three)  times daily as needed for muscle spasms. 02/02/15  Yes Le, Thao P, DO  eletriptan (RELPAX) 40 MG tablet Take 40 mg by mouth as needed for migraine or headache. One tablet by mouth at onset of headache. May repeat in 2 hours if headache persists or recurs.   Yes [provider]  Fluticasone-Salmeterol (ADVAIR) 250-50 MCG/DOSE AEPB Inhale 1 puff into the lungs 2 (two) times daily.   Yes [provider]  ibuprofen (ADVIL,MOTRIN) 800 MG tablet Take 800 mg by mouth every 8 (eight) hours as needed for mild pain.   Yes [provider]  pravastatin (PRAVACHOL) 40 MG tablet Take 1 tablet by mouth daily. 09/28/15  Yes [provider]  rOPINIRole (REQUIP) 1 MG tablet Take 1 mg by mouth 3 (three) times daily.   Yes [provider]  traZODone (DESYREL) 50 MG tablet Take 1 tablet by mouth at bedtime. 11/16/15  Yes [provider]     Allergies  Allergen Reactions  . Jasmine Oil Shortness Of Breath and Swelling  . Penicillins Anaphylaxis and Hives  . Aleve [Naproxen Sodium] Itching  . Benadryl [Diphenhydramine] Other (See Comments)    hallucinations  . Ceftin [Cefuroxime Axetil] Other (See Comments)    unsure  . Depo-Provera [Medroxyprogesterone Acetate] Other (See Comments)    Severe acne  . Oxycodone Nausea And Vomiting  . Tramadol  Other (See Comments)    Kept her up all night       Objective:  Physical Exam     Dg Lumbar Spine Complete  Result Date: 10/22/2016 CLINICAL DATA:  Low back pain for 6 months with worsening in the last 2 weeks, right leg pain, no injury. EXAM: LUMBAR SPINE - COMPLETE 4+ VIEW COMPARISON:  CT abdomen pelvis 12/06/2015. FINDINGS: Alignment is anatomic. Vertebral body height is maintained. Multilevel endplate degenerative changes, worst at L4-5 and L5-S1, where there is also loss of disc space height. Facet hypertrophy at L5-S1. No definite pars defects. IMPRESSION: Multilevel degenerative disc disease, worst at L4-5 and  L5-S1. Electronically Signed   By: Lorin Picket M.D.   On: 10/22/2016 11:45       Assessment & Plan:   Problem List Items Addressed This Visit    DDD (degenerative disc disease), lumbar   Relevant Medications   ketorolac (TORADOL) 30 MG/ML injection 30 mg (Completed)   predniSONE (DELTASONE) 20 MG tablet   cyclobenzaprine (FLEXERIL) 10 MG tablet   meloxicam (MOBIC) 15 MG tablet    Other Visit Diagnoses    Chronic right-sided low back pain with right-sided sciatica    -  Primary   Relevant Medications   ketorolac (TORADOL) 30 MG/ML injection 30 mg (Completed)   promethazine (PHENERGAN) injection 25 mg (Completed)   predniSONE (DELTASONE) 20 MG tablet   cyclobenzaprine (FLEXERIL) 10 MG tablet   meloxicam (MOBIC) 15 MG tablet   Other Relevant Orders   DG Lumbar Spine Complete (Completed)       Return if symptoms worsen or fail to improve.   Fara Chute, PA-C [facility] [place]

## 2016-10-22 NOTE — Patient Instructions (Addendum)
Rest, but do not stay in bed or on the couch. Get up and move, gently, and slowly, as you are able.  DO NOT start the meloxicam until you complete the prednisone. DO NOT take ibuprofen while taking the prednisone or meloxicam. The cyclobenzaprine will make you sleepy, so you may need to reserve it for bedtime. Take the prednisone in the mornings, with breakfast.  Follow-up with Dr. Alyson Ingles to develop a plan going forward.   We recommend that you schedule a mammogram for breast cancer screening. Typically, you do not need a referral to do this. Please contact a local imaging center to schedule your mammogram.  [facility] - [phone]  *ask for the Radiology Department [facility] ([place]) - [phone] or [phone]  [facility] - [phone] [place] [phone] [facility] - [phone]  *ask for the [facility] - [phone]  *ask for the Radiology Department [facility] - [phone]  *ask for the [facility] - [phone]  IF you received an x-ray today, you will receive an invoice from [facility]. Please contact [facility] at [phone] with questions or concerns regarding your invoice.   IF you received labwork today, you will receive an invoice from [place]. Please contact [facility] at [phone] with questions or concerns regarding your invoice.   Our billing staff will not be able to assist you with questions regarding bills from these companies.  You will be contacted with the lab results as soon as they are available. The fastest way to get your results is to activate your My Chart account. Instructions are located on the last page of this paperwork. If you have not heard from [place] regarding the results in 2 weeks, please contact this office.

## 2016-11-08 ENCOUNTER — Ambulatory Visit: Payer: BC Managed Care – PPO

## 2016-11-23 ENCOUNTER — Other Ambulatory Visit: Payer: Self-pay | Admitting: Physician Assistant

## 2016-11-23 DIAGNOSIS — G8929 Other chronic pain: Secondary | ICD-10-CM

## 2016-11-23 DIAGNOSIS — M5441 Lumbago with sciatica, right side: Principal | ICD-10-CM

## 2017-02-28 ENCOUNTER — Ambulatory Visit (INDEPENDENT_AMBULATORY_CARE_PROVIDER_SITE_OTHER): Payer: BC Managed Care – PPO

## 2017-02-28 ENCOUNTER — Ambulatory Visit (INDEPENDENT_AMBULATORY_CARE_PROVIDER_SITE_OTHER): Payer: BC Managed Care – PPO | Admitting: Physician Assistant

## 2017-02-28 VITALS — BP 159/86 | HR 134 | Temp 99.4°F | Resp 16 | Ht 63.5 in | Wt 148.0 lb

## 2017-02-28 DIAGNOSIS — J441 Chronic obstructive pulmonary disease with (acute) exacerbation: Secondary | ICD-10-CM | POA: Diagnosis not present

## 2017-02-28 DIAGNOSIS — J101 Influenza due to other identified influenza virus with other respiratory manifestations: Secondary | ICD-10-CM

## 2017-02-28 DIAGNOSIS — R058 Other specified cough: Secondary | ICD-10-CM

## 2017-02-28 DIAGNOSIS — R05 Cough: Secondary | ICD-10-CM

## 2017-02-28 DIAGNOSIS — R0600 Dyspnea, unspecified: Secondary | ICD-10-CM

## 2017-02-28 LAB — POCT CBC
GRANULOCYTE PERCENT: 83 % — AB (ref 37–80)
HEMATOCRIT: 41 % (ref 37.7–47.9)
HEMOGLOBIN: 13.3 g/dL (ref 12.2–16.2)
Lymph, poc: 1.5 (ref 0.6–3.4)
MCH, POC: 31.4 pg — AB (ref 27–31.2)
MCHC: 32.5 g/dL (ref 31.8–35.4)
MCV: 96.4 fL (ref 80–97)
MID (cbc): 0.6 (ref 0–0.9)
MPV: 7.2 fL (ref 0–99.8)
PLATELET COUNT, POC: 219 10*3/uL (ref 142–424)
POC Granulocyte: 10.4 — AB (ref 2–6.9)
POC LYMPH PERCENT: 12.2 %L (ref 10–50)
POC MID %: 4.8 %M (ref 0–12)
RBC: 4.25 M/uL (ref 4.04–5.48)
RDW, POC: 13.4 %
WBC: 12.5 10*3/uL — AB (ref 4.6–10.2)

## 2017-02-28 LAB — POCT INFLUENZA A/B
INFLUENZA A, POC: POSITIVE — AB
INFLUENZA B, POC: NEGATIVE

## 2017-02-28 MED ORDER — METHYLPREDNISOLONE SODIUM SUCC 125 MG IJ SOLR
125.0000 mg | Freq: Once | INTRAMUSCULAR | Status: DC
Start: 2017-02-28 — End: 2017-02-28

## 2017-02-28 MED ORDER — IPRATROPIUM BROMIDE 0.02 % IN SOLN
0.5000 mg | Freq: Once | RESPIRATORY_TRACT | Status: AC
  Administered 2017-02-28: 0.5 mg via RESPIRATORY_TRACT

## 2017-02-28 MED ORDER — OSELTAMIVIR PHOSPHATE 75 MG PO CAPS: 75.0000 mg | ORAL_CAPSULE | Freq: Two times a day (BID) | ORAL | 0 refills | Status: DC

## 2017-02-28 MED ORDER — BENZONATATE 100 MG PO CAPS: 100.0000 mg | ORAL_CAPSULE | Freq: Three times a day (TID) | ORAL | 0 refills | Status: DC | PRN

## 2017-02-28 MED ORDER — HYDROCOD POLST-CPM POLST ER 10-8 MG/5ML PO SUER: 5.0000 mL | Freq: Every evening | ORAL | 0 refills | Status: DC | PRN

## 2017-02-28 MED ORDER — DOXYCYCLINE HYCLATE 100 MG PO CAPS: 100.0000 mg | ORAL_CAPSULE | Freq: Two times a day (BID) | ORAL | 0 refills | Status: AC

## 2017-02-28 MED ORDER — LEVALBUTEROL HCL 1.25 MG/0.5ML IN NEBU
1.2500 mg | INHALATION_SOLUTION | Freq: Once | RESPIRATORY_TRACT | Status: AC
  Administered 2017-02-28: 1.25 mg via RESPIRATORY_TRACT

## 2017-02-28 MED ORDER — PREDNISONE 20 MG PO TABS: ORAL_TABLET | ORAL | 0 refills | Status: DC

## 2017-02-28 MED ORDER — METHYLPREDNISOLONE SODIUM SUCC 125 MG IJ SOLR
80.0000 mg | Freq: Once | INTRAMUSCULAR | Status: AC
  Administered 2017-02-28: 80 mg via INTRAVENOUS

## 2017-02-28 MED ORDER — GUAIFENESIN ER 1200 MG PO TB12: 1.0000 | ORAL_TABLET | Freq: Two times a day (BID) | ORAL | 1 refills | Status: DC | PRN

## 2017-02-28 NOTE — Progress Notes (Signed)
PRIMARY CARE AT [facility] [address] [place] [address] [phone]  Date:  02/28/2017   Name:  Vicki [name]   DOB:  [birth year]   MRN:  [id number]  PCP:  Maury Dus, MD    History of Present Illness:  Vicki [name] is a 51 y.o. female patient who presents to PCP with  Chief Complaint  Patient presents with  . Shortness of Breath    x monday  . Chest Pain    xmonday  . Cough     Patient reports 4 days of subjective fever, productive cough of green sputum. Patient reports subjective fever and chills. She obtained her flu vaccine from [facility] 4 days ago. She has been using her inhalers which rescue inhaler stopped helping yesterday afternoon.  She has some lightheadedness and nausea with the coughing. She has nasal congestion She reports that she has taken ibuprofen for her fever She does not have or use oxygen at home Last cigarette was yesterday  chest pains secondary to her coughing,  no palpitations, or calf pain.   Patient Active Problem List   Diagnosis Date Noted  . DDD (degenerative disc disease), lumbar 10/22/2016    Past Medical History:  Diagnosis Date  . Asthma   . Carpal tunnel syndrome   . COPD (chronic obstructive pulmonary disease) ([place])   . Diverticulitis     Past Surgical History:  Procedure Laterality Date  . ABDOMINAL HYSTERECTOMY  2009  . APPENDECTOMY    . ARTHROSCOPIC REPAIR ACL Left 2011   x 2  . BUNIONECTOMY  2006   both  . CARPAL TUNNEL RELEASE Bilateral 10/28/2013   Procedure: BILATERAL CARPAL TUNNEL RELEASE;  Surgeon: Wynonia Sours, MD;  Location: [facility];  Service: Orthopedics;  Laterality: Bilateral;  . DILATION AND CURETTAGE OF UTERUS    . TUBAL LIGATION  2004    Social History  Substance Use Topics  . Smoking status: Current Every Day Smoker    Packs/day: 0.50  . Smokeless tobacco: Never Used  . Alcohol use Yes     Comment: occ    Family History  Problem Relation Age of Onset  . Cancer  Mother   . Cancer Sister 60       breast cancer    Allergies  Allergen Reactions  . Jasmine Oil Shortness Of Breath and Swelling  . Penicillins Anaphylaxis and Hives  . Aleve [Naproxen Sodium] Itching  . Benadryl [Diphenhydramine] Other (See Comments)    hallucinations  . Ceftin [Cefuroxime Axetil] Other (See Comments)    unsure  . Depo-Provera [Medroxyprogesterone Acetate] Other (See Comments)    Severe acne  . Oxycodone Nausea And Vomiting  . Tramadol Other (See Comments)    Kept her up all night    Medication list has been reviewed and updated.  Current Outpatient Prescriptions on File Prior to Visit  Medication Sig Dispense Refill  . albuterol (PROVENTIL HFA;VENTOLIN HFA) 108 (90 Base) MCG/ACT inhaler Inhale 2 puffs into the lungs every 4 (four) hours as needed for wheezing or shortness of breath. 1 Inhaler 0  . ALPRAZolam (XANAX) 1 MG tablet Take 0.5-1 mg by mouth daily as needed for anxiety or sleep.     Marland Kitchen aspirin 81 MG tablet Take 81 mg by mouth daily.    . citalopram (CELEXA) 40 MG tablet Take 60 mg by mouth daily.    . cyclobenzaprine (FLEXERIL) 10 MG tablet Take 1 tablet (10 mg total) by mouth 3 (three) times  daily as needed for muscle spasms. 30 tablet 0  . eletriptan (RELPAX) 40 MG tablet Take 40 mg by mouth as needed for migraine or headache. One tablet by mouth at onset of headache. May repeat in 2 hours if headache persists or recurs.    . Fluticasone-Salmeterol (ADVAIR) 250-50 MCG/DOSE AEPB Inhale 1 puff into the lungs 2 (two) times daily.    Marland Kitchen ibuprofen (ADVIL,MOTRIN) 800 MG tablet Take 800 mg by mouth every 8 (eight) hours as needed for mild pain.    . meloxicam (MOBIC) 15 MG tablet TAKE 1 TABLET BY MOUTH EVERY DAY 30 tablet 0  . pravastatin (PRAVACHOL) 40 MG tablet Take 1 tablet by mouth daily.    . predniSONE (DELTASONE) 20 MG tablet Take 3 PO QAM x3days, 2 PO QAM x3days, 1 PO QAM x3days 18 tablet 0  . rOPINIRole (REQUIP) 1 MG tablet Take 1 mg by mouth 3  (three) times daily.    . traZODone (DESYREL) 50 MG tablet Take 1 tablet by mouth at bedtime.     No current facility-administered medications on file prior to visit.     ROS ROS otherwise unremarkable unless listed above.  Physical Examination: BP (!) 159/86   Pulse (!) 134   Temp 99.4 F (37.4 C) (Oral)   Resp 16   Ht 5' 3.5" (1.613 m)   Wt 148 lb (67.1 kg)   SpO2 90%   BMI 25.81 kg/m  Ideal Body Weight: Weight in (lb) to have BMI = 25: 143.1 Temperature rechecked at 100.3.  Last ibuprofen given 2 hours ago of 800mg , and 800mg  in the morning.  Physical Exam  Constitutional: She is oriented to person, place, and time. She appears well-developed and well-nourished. No distress.  HENT:  Head: Normocephalic and atraumatic.  Right Ear: Tympanic membrane, external ear and ear canal normal.  Left Ear: Tympanic membrane, external ear and ear canal normal.  Nose: Mucosal edema and rhinorrhea present. Right sinus exhibits no maxillary sinus tenderness and no frontal sinus tenderness. Left sinus exhibits no maxillary sinus tenderness and no frontal sinus tenderness.  Mouth/Throat: No uvula swelling. No oropharyngeal exudate, posterior oropharyngeal edema or posterior oropharyngeal erythema.  Eyes: Pupils are equal, round, and reactive to light. Conjunctivae and EOM are normal.  Cardiovascular: Normal rate and regular rhythm.  Exam reveals no gallop, no distant heart sounds and no friction rub.   No murmur heard. Pulmonary/Chest: Accessory muscle usage (none by the end of visit) present. Tachypnea noted. She is in respiratory distress (resolved by end of visit). She has decreased breath sounds (improvement following duo nebulizer.). She has wheezes (minimal and distant). She has no rhonchi.  Lymphadenopathy:       Head (right side): No submandibular, no tonsillar, no preauricular and no posterior auricular adenopathy present.       Head (left side): No submandibular, no tonsillar, no  preauricular and no posterior auricular adenopathy present.  Neurological: She is alert and oriented to person, place, and time.  Skin: She is not diaphoretic.  Psychiatric: She has a normal mood and affect. Her behavior is normal.   Oxygen picks up to 95% at rest on 2L She can walk with stable oxygen with 2L Walking decreases to about 90% without the 2L  Assessment and Plan: Vicki [name] is a 51 y.o. female who is here today for cc of  Chief Complaint  Patient presents with  . Shortness of Breath    x monday  . Chest Pain  [id number]  . Cough  given 2nd duo nebulizer of the levalbuterol due to panic symptoms and HR.  Solu-medrol given.  Treating with both anti-viral and antibiotic given health history, suggested tamiflu today though out of timeline.  Strict instruction and caution given to warrant immediate call to ED if improvement does not continue to trend.   She will start prednisone taper tomorrow. Spoke with husband over the phone.  Discussed nsaid use for fever and alarming sxs.  He also voiced understanding.  He states he will pick up the medications.   rtc in 2 days for follow up COPD exacerbation ([place]) - Plan: methylPREDNISolone sodium succinate (SOLU-MEDROL) 125 mg/2 mL injection 80 mg, oseltamivir (TAMIFLU) 75 MG capsule, doxycycline (VIBRAMYCIN) 100 MG capsule, predniSONE (DELTASONE) 20 MG tablet  Influenza A - Plan: oseltamivir (TAMIFLU) 75 MG capsule, doxycycline (VIBRAMYCIN) 100 MG capsule, predniSONE (DELTASONE) 20 MG tablet  Productive cough - Plan: levalbuterol (XOPENEX) nebulizer solution 1.25 mg, POCT CBC, ipratropium (ATROVENT) nebulizer solution 0.5 mg, DG Chest 2 View, POCT Influenza A/B, methylPREDNISolone sodium succinate (SOLU-MEDROL) 125 mg/2 mL injection 80 mg, oseltamivir (TAMIFLU) 75 MG capsule, doxycycline (VIBRAMYCIN) 100 MG capsule, predniSONE (DELTASONE) 20 MG tablet, levalbuterol (XOPENEX) nebulizer solution 1.25 mg, ipratropium (ATROVENT)  nebulizer solution 0.5 mg, DISCONTINUED: methylPREDNISolone sodium succinate (SOLU-MEDROL) 125 mg/2 mL injection 125 mg  Dyspnea, unspecified type - Plan: methylPREDNISolone sodium succinate (SOLU-MEDROL) 125 mg/2 mL injection 80 mg, oseltamivir (TAMIFLU) 75 MG capsule, doxycycline (VIBRAMYCIN) 100 MG capsule, predniSONE (DELTASONE) 20 MG tablet  Ivar Drape, PA-C [facility] 10/19/20189:16 AM

## 2017-02-28 NOTE — Patient Instructions (Addendum)
You can use the albuterol every 6 hours routinely for the next 24 hours. Please take the medications as prescribed. You will start the prednisone taper tomorrow. Please seek emergency care if your symptoms worsen.   Chronic Obstructive Pulmonary Disease Exacerbation Chronic obstructive pulmonary disease (COPD) is a common lung problem. In COPD, the flow of air from the lungs is limited. COPD exacerbations are times that breathing gets worse and you need extra treatment. Without treatment they can be life threatening. If they happen often, your lungs can become more damaged. If your COPD gets worse, your doctor may treat you with:  Medicines.  Oxygen.  Different ways to clear your airway, such as using a mask.  Follow these instructions at home:  Do not smoke.  Avoid tobacco smoke and other things that bother your lungs.  If given, take your antibiotic medicine as told. Finish the medicine even if you start to feel better.  Only take medicines as told by your doctor.  Drink enough fluids to keep your pee (urine) clear or pale yellow (unless your doctor has told you not to).  Use a cool mist machine (vaporizer).  If you use oxygen or a machine that turns liquid medicine into a mist (nebulizer), continue to use them as told.  Keep up with shots (vaccinations) as told by your doctor.  Exercise regularly.  Eat healthy foods.  Keep all doctor visits as told. Get help right away if:  You are very short of breath and it gets worse.  You have trouble talking.  You have bad chest pain.  You have blood in your spit (sputum).  You have a fever.  You keep throwing up (vomiting).  You feel weak, or you pass out (faint).  You feel confused.  You keep getting worse. This information is not intended to replace advice given to you by your health care provider. Make sure you discuss any questions you have with your health care provider. Document Released: 04/19/2011 Document  Revised: 10/06/2015 Document Reviewed: 01/02/2013 Elsevier Interactive Patient Education  2017 [employer].     IF you received an x-ray today, you will receive an invoice from [facility]. Please contact [facility] at [phone] with questions or concerns regarding your invoice.   IF you received labwork today, you will receive an invoice from [facility]. Please contact [facility] at [phone] with questions or concerns regarding your invoice.   Our billing staff will not be able to assist you with questions regarding bills from these companies.  You will be contacted with the lab results as soon as they are available. The fastest way to get your results is to activate your My Chart account. Instructions are located on the last page of this paperwork. If you have not heard from [place] regarding the results in 2 weeks, please contact this office.

## 2017-03-01 ENCOUNTER — Encounter: Payer: Self-pay | Admitting: Physician Assistant

## 2017-03-02 ENCOUNTER — Ambulatory Visit (INDEPENDENT_AMBULATORY_CARE_PROVIDER_SITE_OTHER): Payer: BC Managed Care – PPO | Admitting: Physician Assistant

## 2017-03-02 ENCOUNTER — Encounter: Payer: Self-pay | Admitting: Physician Assistant

## 2017-03-02 VITALS — BP 118/70 | HR 92 | Temp 98.5°F | Resp 16 | Ht 64.0 in | Wt 147.8 lb

## 2017-03-02 DIAGNOSIS — F17200 Nicotine dependence, unspecified, uncomplicated: Secondary | ICD-10-CM | POA: Diagnosis not present

## 2017-03-02 DIAGNOSIS — J09X2 Influenza due to identified novel influenza A virus with other respiratory manifestations: Secondary | ICD-10-CM

## 2017-03-02 DIAGNOSIS — J441 Chronic obstructive pulmonary disease with (acute) exacerbation: Secondary | ICD-10-CM | POA: Diagnosis not present

## 2017-03-02 DIAGNOSIS — Z716 Tobacco abuse counseling: Secondary | ICD-10-CM

## 2017-03-02 MED ORDER — CETIRIZINE HCL 10 MG PO TABS: 10.0000 mg | ORAL_TABLET | Freq: Every day | ORAL | 11 refills | Status: DC

## 2017-03-02 MED ORDER — VARENICLINE TARTRATE 0.5 MG X 11 & 1 MG X 42 PO MISC: ORAL | 0 refills | Status: DC

## 2017-03-02 MED ORDER — IPRATROPIUM BROMIDE 0.02 % IN SOLN
0.5000 mg | Freq: Once | RESPIRATORY_TRACT | Status: AC
  Administered 2017-03-02: 0.5 mg via RESPIRATORY_TRACT

## 2017-03-02 MED ORDER — LEVALBUTEROL HCL 1.25 MG/0.5ML IN NEBU
1.2500 mg | INHALATION_SOLUTION | Freq: Once | RESPIRATORY_TRACT | Status: AC
  Administered 2017-03-02: 1.25 mg via RESPIRATORY_TRACT

## 2017-03-02 NOTE — Patient Instructions (Addendum)
   Chronic Obstructive Pulmonary Disease Exacerbation Chronic obstructive pulmonary disease (COPD) is a common lung problem. In COPD, the flow of air from the lungs is limited. COPD exacerbations are times that breathing gets worse and you need extra treatment. Without treatment they can be life threatening. If they happen often, your lungs can become more damaged. If your COPD gets worse, your doctor may treat you with:  Medicines.  Oxygen.  Different ways to clear your airway, such as using a mask.  Follow these instructions at home:  Do not smoke.  Avoid tobacco smoke and other things that bother your lungs.  If given, take your antibiotic medicine as told. Finish the medicine even if you start to feel better.  Only take medicines as told by your doctor.  Drink enough fluids to keep your pee (urine) clear or pale yellow (unless your doctor has told you not to).  Use a cool mist machine (vaporizer).  If you use oxygen or a machine that turns liquid medicine into a mist (nebulizer), continue to use them as told.  Keep up with shots (vaccinations) as told by your doctor.  Exercise regularly.  Eat healthy foods.  Keep all doctor visits as told. Get help right away if:  You are very short of breath and it gets worse.  You have trouble talking.  You have bad chest pain.  You have blood in your spit (sputum).  You have a fever.  You keep throwing up (vomiting).  You feel weak, or you pass out (faint).  You feel confused.  You keep getting worse. This information is not intended to replace advice given to you by your health care provider. Make sure you discuss any questions you have with your health care provider. Document Released: 04/19/2011 Document Revised: 10/06/2015 Document Reviewed: 01/02/2013 Elsevier Interactive Patient Education  2017 [employer].    IF you received an x-ray today, you will receive an invoice from [facility]. Please contact  [facility] at [phone] with questions or concerns regarding your invoice.   IF you received labwork today, you will receive an invoice from [place]. Please contact [facility] at [phone] with questions or concerns regarding your invoice.   Our billing staff will not be able to assist you with questions regarding bills from these companies.  You will be contacted with the lab results as soon as they are available. The fastest way to get your results is to activate your My Chart account. Instructions are located on the last page of this paperwork. If you have not heard from [place] regarding the results in 2 weeks, please contact this office.

## 2017-03-02 NOTE — Progress Notes (Signed)
PRIMARY CARE AT [facility] [address] [place] [address] [phone]  Date:  03/02/2017   Name:  Vicki [name]   DOB:  1965-08-21   MRN:  [id number]  PCP:  Maury Dus, MD    History of Present Illness:  Vicki [name] is a 51 y.o. female patient who presents to PCP with  Chief Complaint  Patient presents with  . Follow-up    COPD, Flu symptoms     Patient was here 2 days ago in respiratory distress--cough, fever and dyspnea.  dxd copd with exacerbation and influenza A.  She was given doxycycline, tamiflu, prednisone taper.  she reports that she is having improvement of her symptoms little by little.  She feels marked improvement from 2 days ago.  She continues to have productive cough. She has avoided tobacco use, except may have 1 puff "to taste the tobacco" per day.  She notes that she would like to quit smoking.  Longest was 1 year with the assistance of chantix.  This had no side effects that she could note.   Patient Active Problem List   Diagnosis Date Noted  . DDD (degenerative disc disease), lumbar 10/22/2016    Past Medical History:  Diagnosis Date  . Asthma   . Carpal tunnel syndrome   . COPD (chronic obstructive pulmonary disease) ([place])   . Diverticulitis     Past Surgical History:  Procedure Laterality Date  . ABDOMINAL HYSTERECTOMY  2009  . APPENDECTOMY    . ARTHROSCOPIC REPAIR ACL Left 2011   x 2  . BUNIONECTOMY  2006   both  . CARPAL TUNNEL RELEASE Bilateral 10/28/2013   Procedure: BILATERAL CARPAL TUNNEL RELEASE;  Surgeon: Wynonia Sours, MD;  Location: [place];  Service: Orthopedics;  Laterality: Bilateral;  . DILATION AND CURETTAGE OF UTERUS    . TUBAL LIGATION  2004    Social History  Substance Use Topics  . Smoking status: Current Every Day Smoker    Packs/day: 0.50  . Smokeless tobacco: Never Used  . Alcohol use Yes     Comment: occ    Family History  Problem Relation Age of Onset  . Cancer Mother   .  Cancer Sister 49       breast cancer    Allergies  Allergen Reactions  . Jasmine Oil Shortness Of Breath and Swelling  . Penicillins Anaphylaxis and Hives  . Aleve [Naproxen Sodium] Itching  . Benadryl [Diphenhydramine] Other (See Comments)    hallucinations  . Ceftin [Cefuroxime Axetil] Other (See Comments)    unsure  . Depo-Provera [Medroxyprogesterone Acetate] Other (See Comments)    Severe acne  . Oxycodone Nausea And Vomiting  . Tramadol Other (See Comments)    Kept her up all night    Medication list has been reviewed and updated.  Current Outpatient Prescriptions on File Prior to Visit  Medication Sig Dispense Refill  . albuterol (PROVENTIL HFA;VENTOLIN HFA) 108 (90 Base) MCG/ACT inhaler Inhale 2 puffs into the lungs every 4 (four) hours as needed for wheezing or shortness of breath. 1 Inhaler 0  . ALPRAZolam (XANAX) 1 MG tablet Take 0.5-1 mg by mouth daily as needed for anxiety or sleep.     Marland Kitchen aspirin 81 MG tablet Take 81 mg by mouth daily.    . benzonatate (TESSALON) 100 MG capsule Take 1-2 capsules (100-200 mg total) by mouth 3 (three) times daily as needed for cough. 40 capsule 0  . chlorpheniramine-HYDROcodone (TUSSIONEX PENNKINETIC ER)  10-8 MG/5ML SUER Take 5 mLs by mouth at bedtime as needed. 50 mL 0  . citalopram (CELEXA) 40 MG tablet Take 60 mg by mouth daily.    . cyclobenzaprine (FLEXERIL) 10 MG tablet Take 1 tablet (10 mg total) by mouth 3 (three) times daily as needed for muscle spasms. 30 tablet 0  . doxycycline (VIBRAMYCIN) 100 MG capsule Take 1 capsule (100 mg total) by mouth 2 (two) times daily. 20 capsule 0  . eletriptan (RELPAX) 40 MG tablet Take 40 mg by mouth as needed for migraine or headache. One tablet by mouth at onset of headache. May repeat in 2 hours if headache persists or recurs.    . Fluticasone-Salmeterol (ADVAIR) 250-50 MCG/DOSE AEPB Inhale 1 puff into the lungs 2 (two) times daily.    . Guaifenesin (MUCINEX MAXIMUM STRENGTH) 1200 MG TB12 Take  1 tablet (1,200 mg total) by mouth every 12 (twelve) hours as needed. 14 tablet 1  . ibuprofen (ADVIL,MOTRIN) 800 MG tablet Take 800 mg by mouth every 8 (eight) hours as needed for mild pain.    . meloxicam (MOBIC) 15 MG tablet TAKE 1 TABLET BY MOUTH EVERY DAY 30 tablet 0  . oseltamivir (TAMIFLU) 75 MG capsule Take 1 capsule (75 mg total) by mouth 2 (two) times daily. 10 capsule 0  . pravastatin (PRAVACHOL) 40 MG tablet Take 1 tablet by mouth daily.    . predniSONE (DELTASONE) 20 MG tablet Take 3 PO QAM x3days, 2 PO QAM x3days, 1 PO QAM x3days 18 tablet 0  . rOPINIRole (REQUIP) 1 MG tablet Take 1 mg by mouth 3 (three) times daily.    . traZODone (DESYREL) 50 MG tablet Take 1 tablet by mouth at bedtime.     No current facility-administered medications on file prior to visit.     ROS ROS otherwise unremarkable unless listed above.  Physical Examination: BP 118/70   Pulse 100   Temp 98.5 F (36.9 C) (Oral)   Resp 16   Ht 5\' 4"  (1.626 m)   Wt 147 lb 12.8 oz (67 kg)   SpO2 93%   BMI 25.37 kg/m  Ideal Body Weight: Weight in (lb) to have BMI = 25: 145.3  Physical Exam  Constitutional: She is oriented to person, place, and time. She appears well-developed and well-nourished. No distress.  HENT:  Head: Normocephalic and atraumatic.  Right Ear: Tympanic membrane, external ear and ear canal normal.  Left Ear: Tympanic membrane, external ear and ear canal normal.  Nose: Mucosal edema and rhinorrhea present. Right sinus exhibits no maxillary sinus tenderness and no frontal sinus tenderness. Left sinus exhibits no maxillary sinus tenderness and no frontal sinus tenderness.  Mouth/Throat: No uvula swelling. No oropharyngeal exudate, posterior oropharyngeal edema or posterior oropharyngeal erythema.  Eyes: Pupils are equal, round, and reactive to light. Conjunctivae and EOM are normal.  Cardiovascular: Normal rate and regular rhythm.  Exam reveals no gallop, no distant heart sounds and no  friction rub.   No murmur heard. Pulmonary/Chest: Effort normal. No respiratory distress. She has decreased breath sounds (throughout). She has rhonchi (anterior left>throughout).  Lymphadenopathy:       Head (right side): No submandibular, no tonsillar, no preauricular and no posterior auricular adenopathy present.       Head (left side): No submandibular, no tonsillar, no preauricular and no posterior auricular adenopathy present.  Neurological: She is alert and oriented to person, place, and time.  Skin: She is not diaphoretic.  Psychiatric: She has a normal mood  and affect. Her behavior is normal.     Assessment and Plan: Vicki [name] is a 51 y.o. female who is here today for cc of  Chief Complaint  Patient presents with  . Follow-up    COPD, Flu symptoms  she notes that she would like to move her care to pcp.  She would like to stop smoking again.  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

## 2017-03-03 LAB — BASIC METABOLIC PANEL
BUN/Creatinine Ratio: 26 — ABNORMAL HIGH (ref 9–23)
BUN: 17 mg/dL (ref 6–24)
CALCIUM: 9.6 mg/dL (ref 8.7–10.2)
CHLORIDE: 97 mmol/L (ref 96–106)
CO2: 26 mmol/L (ref 20–29)
Creatinine, Ser: 0.66 mg/dL (ref 0.57–1.00)
GFR calc non Af Amer: 104 mL/min/{1.73_m2} (ref >59)
GFR, EST AFRICAN AMERICAN: 120 mL/min/{1.73_m2} (ref >59)
Glucose: 99 mg/dL (ref 65–99)
POTASSIUM: 3.9 mmol/L (ref 3.5–5.2)
SODIUM: 139 mmol/L (ref 134–144)

## 2017-03-04 ENCOUNTER — Ambulatory Visit: Payer: BC Managed Care – PPO | Admitting: Physician Assistant

## 2017-05-02 ENCOUNTER — Telehealth: Payer: Self-pay | Admitting: *Deleted

## 2017-05-02 NOTE — Telephone Encounter (Signed)
Copied from [place] [id number]. Topic: Medical Record Request - Other >> May 02, 2017  9:14 AM Percell Belt A wrote: Patient Name/DOB/MRN #: [id number] Requestor Name/Agency: [facility]  Call Back #:  Information Requested: All records were coming in Pt wanted to check and see if her records have been rec'd Best number [phone]   Route to [facility] for [place] clinics. For all other clinics, route to the clinic's PEC Pool.

## 2017-05-09 ENCOUNTER — Encounter: Payer: Self-pay | Admitting: Physician Assistant

## 2017-05-09 ENCOUNTER — Ambulatory Visit: Payer: 59 | Admitting: Physician Assistant

## 2017-05-09 ENCOUNTER — Other Ambulatory Visit: Payer: Self-pay

## 2017-05-09 VITALS — BP 120/96 | HR 95 | Temp 98.2°F | Resp 20 | Ht 63.75 in | Wt 158.0 lb

## 2017-05-09 DIAGNOSIS — R05 Cough: Secondary | ICD-10-CM

## 2017-05-09 DIAGNOSIS — N644 Mastodynia: Secondary | ICD-10-CM

## 2017-05-09 DIAGNOSIS — G8929 Other chronic pain: Secondary | ICD-10-CM | POA: Diagnosis not present

## 2017-05-09 DIAGNOSIS — M25511 Pain in right shoulder: Secondary | ICD-10-CM | POA: Diagnosis not present

## 2017-05-09 DIAGNOSIS — M5441 Lumbago with sciatica, right side: Secondary | ICD-10-CM

## 2017-05-09 DIAGNOSIS — R059 Cough, unspecified: Secondary | ICD-10-CM

## 2017-05-09 DIAGNOSIS — J320 Chronic maxillary sinusitis: Secondary | ICD-10-CM | POA: Diagnosis not present

## 2017-05-09 MED ORDER — CYCLOBENZAPRINE HCL 10 MG PO TABS: 10.0000 mg | ORAL_TABLET | Freq: Three times a day (TID) | ORAL | 0 refills | Status: DC | PRN

## 2017-05-09 MED ORDER — BENZONATATE 100 MG PO CAPS: 100.0000 mg | ORAL_CAPSULE | Freq: Three times a day (TID) | ORAL | 0 refills | Status: DC | PRN

## 2017-05-09 MED ORDER — HYDROCOD POLST-CPM POLST ER 10-8 MG/5ML PO SUER: 5.0000 mL | Freq: Two times a day (BID) | ORAL | 0 refills | Status: DC | PRN

## 2017-05-09 MED ORDER — DOXYCYCLINE HYCLATE 100 MG PO CAPS: 100.0000 mg | ORAL_CAPSULE | Freq: Two times a day (BID) | ORAL | 0 refills | Status: AC

## 2017-05-09 NOTE — Patient Instructions (Addendum)
Schedule an appointment for mammogram.   Apply heat to your muscles 2-3 times daily for 20-30 minutes a time.  Use tennis ball to massage muscles.  Stay well hydrated - drink 2 liters of water daily.  Stretch your neck and back.  Do not sleep on your side.  Avoid slouching.  Come back in 3-4 weeks if you are not better.   See below for sinusitis home care information.   ACUTE VIRAL RHINOSINUSITIS-Patients with acute viral rhinosinusitis (AVRS) should be managed with supportive care. There are no treatments to shorten the clinical course of the disease.  Natural history-AVRS may not completely resolve within 10 days but is expected to improve. Patients who fail to improve after ?10 days of symptomatic management are more likely to have acute bacterial rhinosinusitis. Symptomatic therapies-Symptomatic management of acute rhinosinusitis (ARS) aims to relieve symptoms of nasal obstruction and runny nose as well as the systemic signs and symptoms such as fever and fatigue. When needed, we suggest over-the-counter (OTC) analgesics and antipyretics, saline irrigation, and intranasal glucocorticoids for symptomatic management in patients with ARS. Analgesics and antipyretics-OTC analgesics and antipyretics such as nonsteroidal anti-inflammatory drugs and acetaminophen can be used for pain and fever relief as needed. Saline irrigation-Mechanical irrigation with saline may reduce the need for pain medication and improve overall patient comfort, particularly in patients with frequent sinus infections. It is important that irrigants be prepared from sterile or bottled water. (See below for instructions)  Intranasal glucocorticoids- Intranasal glucocorticoids are likely to be most beneficial for patients with underlying allergies. This allows improved sinus drainage. A higher dose of intranasal glucocorticoids had a stronger effect on symptom improvement. -Other- ?Oral decongestants - Oral  decongestants may be useful when eustachian tube dysfunction is a factor for patients with AVRS. These patients may benefit from a short course (three to five days) of oral decongestants. Oral decongestants should be used with caution in patients with cardiovascular disease, hypertension, angle-closure glaucoma, or bladder neck obstruction. ?Intranasal decongestants - Intranasal decongestants are often used as symptomatic therapies by patients. These agents, such as oxymetazoline, may provide a subjective sense of improved nasal patency. There is also concern that intranasal decongestants themselves may provoke mucosal inflammation. If used, topical decongestants should be used sparingly for no more than three consecutive days to avoid rebound congestion, addiction, and mucosal damage associated with long-term use. ?Antihistamines - Antihistamines are frequently used for symptom relief due to their drying effects; however, there are no studies investigating their efficacy for ARS. Over-drying of the mucosa may lead to further discomfort. Additionally, antihistamines are often associated with adverse effects.  ?Mucolytics - Mucolytics such as guaifenesin serve to thin secretions and may promote ease of mucus drainage and clearance.   SALINE NASAL IRRIGATION  The benefits  1. Saline (saltwater) washes the mucus and irritants from your nose.  2. The sinus passages are moisturized.  3. Studies have also shown that a nasal irrigation improves cell function (the cells that move the mucus work better).  The recipe  Use a one-quart glass jar that is thoroughly cleansed.  You may use a large medical syringe (30 cc), water pick with an irrigation tip (preferred method), squeeze bottle, or Neti pot. Do not use a baby bulb syringe. The syringe or pick should be sterilized frequently or replaced every two to three weeks to avoid contamination and infection.  Fill with water that has been distilled, previously  boiled, or otherwise sterilized. Plain tap water is not recommended, because it is  not necessarily sterile.  Add 1 to 1 heaping teaspoons of pickling/canning salt. Do not use table salt, because it contains a large number of additives.  Add 1 teaspoon of baking soda (pure bicarbonate).  Mix ingredients together, and store at room temperature. Discard after one week.  You may also make up a solution from premixed packets that are commercially prepared specifically for nasal irrigation.  The instructions  Irrigate your nose with saline one to two times per day.   If you have been told to use nasal medication, you should always use your saline solution first. The nasal medication is much more effective when sprayed onto clean nasal membranes, and the spray will reach deeper into the nose.   Pour the amount of fluid you plan to use into a clean bowl. Do not put your used syringe back into the storage container, because it contaminates your solution.   You may warm the solution slightly in the microwave, but be sure that the solution is not hot.   Bend over the sink (some people do this in the shower), and squirt the solution into each side of your nose, aiming the stream toward the back of your head, not the top of your head. The solution should flow into one nostril and out of the other, but it will not harm you if you swallow a little.   Some people experience a little burning sensation the first few times that they use buffered saline solution, but this usually goes away after they adapt to it.      IF you received an x-ray today, you will receive an invoice from [facility]. Please contact [facility] at [phone] with questions or concerns regarding your invoice.   IF you received labwork today, you will receive an invoice from [facility]. Please contact [facility] at [phone] with questions or concerns regarding your invoice.   Our billing staff will not be able to  assist you with questions regarding bills from these companies.  You will be contacted with the lab results as soon as they are available. The fastest way to get your results is to activate your My Chart account. Instructions are located on the last page of this paperwork. If you have not heard from [place] regarding the results in 2 weeks, please contact this office.

## 2017-05-09 NOTE — Progress Notes (Signed)
Vicki [name]  MRN: [id number] DOB: [birth year]  PCP: Maury Dus, MD  Subjective:  Pt is a 51 year old female who presents to clinic for several complaints.   Breast, back and shouler pain x 1 week. Feels "dull" hurts worse when she presses on it. No increased physical labor or work load. Tylenol and ibuprofen are not helping.   Endorses cough, nasal drainage, facial pain x 2+ weeks. She has been taking Mucinex. Not helping. No known sick contacts.   C/o pain of her right nipple x 3 days. Hurts to touch. No discharge, skin changes, redness, swelling, drainage, bleeding, discharge. Did not have MM last year. MM have been negative.  Review of Systems  Constitutional: Negative for chills, diaphoresis, fatigue and fever.  Respiratory: Negative for cough, chest tightness, shortness of breath and wheezing.   Cardiovascular: Positive for chest pain. Negative for palpitations and leg swelling.  Gastrointestinal: Negative for abdominal pain, nausea and vomiting.  Musculoskeletal: Positive for arthralgias and myalgias. Negative for joint swelling, neck pain and neck stiffness.  Skin: Negative.     Patient Active Problem List   Diagnosis Date Noted  . DDD (degenerative disc disease), lumbar 10/22/2016    Current Outpatient Medications on File Prior to Visit  Medication Sig Dispense Refill  . albuterol (PROVENTIL HFA;VENTOLIN HFA) 108 (90 Base) MCG/ACT inhaler Inhale 2 puffs into the lungs every 4 (four) hours as needed for wheezing or shortness of breath. 1 Inhaler 0  . ALPRAZolam (XANAX) 1 MG tablet Take 0.5-1 mg by mouth daily as needed for anxiety or sleep.     Marland Kitchen aspirin 81 MG tablet Take 81 mg by mouth daily.    . cetirizine (ZYRTEC) 10 MG tablet Take 1 tablet (10 mg total) by mouth daily. 30 tablet 11  . citalopram (CELEXA) 40 MG tablet Take 60 mg by mouth daily.    . cyclobenzaprine (FLEXERIL) 10 MG tablet Take 1 tablet (10 mg total) by mouth 3 (three) times daily as needed  for muscle spasms. 30 tablet 0  . eletriptan (RELPAX) 40 MG tablet Take 40 mg by mouth as needed for migraine or headache. One tablet by mouth at onset of headache. May repeat in 2 hours if headache persists or recurs.    . Fluticasone-Salmeterol (ADVAIR) 250-50 MCG/DOSE AEPB Inhale 1 puff into the lungs 2 (two) times daily.    Marland Kitchen ibuprofen (ADVIL,MOTRIN) 800 MG tablet Take 800 mg by mouth every 8 (eight) hours as needed for mild pain.    . meloxicam (MOBIC) 15 MG tablet TAKE 1 TABLET BY MOUTH EVERY DAY 30 tablet 0  . pravastatin (PRAVACHOL) 40 MG tablet Take 1 tablet by mouth daily.    Marland Kitchen rOPINIRole (REQUIP) 1 MG tablet Take 1 mg by mouth 3 (three) times daily.    . traZODone (DESYREL) 50 MG tablet Take 1 tablet by mouth at bedtime.    . varenicline (CHANTIX STARTING MONTH PAK) 0.5 MG X 11 & 1 MG X 42 tablet Take one 0.5 mg tablet by mouth once daily for 3 days, then increase to one 0.5 mg tablet twice daily for 4 days, then increase to one 1 mg tablet twice daily. (Patient not taking: Reported on 05/09/2017) 53 tablet 0   No current facility-administered medications on file prior to visit.     Allergies  Allergen Reactions  . Jasmine Oil Shortness Of Breath and Swelling  . Penicillins Anaphylaxis and Hives  . Aleve [Naproxen Sodium] Itching  . Benadryl [Diphenhydramine]  Other (See Comments)    hallucinations  . Ceftin [Cefuroxime Axetil] Other (See Comments)    unsure  . Depo-Provera [Medroxyprogesterone Acetate] Other (See Comments)    Severe acne  . Oxycodone Nausea And Vomiting  . Tramadol Other (See Comments)    Kept her up all night     Objective:  BP (!) 120/96   Pulse 95   Temp 97.7 F (36.5 C) (Oral)   Resp 20   Ht 5' 3.75" (1.619 m)   Wt 158 lb (71.7 kg)   SpO2 96%   BMI 27.33 kg/m   Physical Exam  Constitutional: She is oriented to person, place, and time and well-developed, well-nourished, and in no distress. No distress.  Cardiovascular: Normal rate, regular  rhythm and normal heart sounds.  Pulmonary/Chest: Right breast exhibits tenderness. Right breast exhibits no inverted nipple, no mass, no nipple discharge and no skin change. Breasts are symmetrical.  Musculoskeletal:       Arms: Neurological: She is alert and oriented to person, place, and time. GCS score is 15.  Skin: Skin is warm and dry.  Psychiatric: Mood, memory, affect and judgment normal.  Vitals reviewed.   A trigger point injection was performed at the site of maximal tenderness using 1% plain Lidocaine. This was well tolerated, and followed by mild relief of pain.  Assessment and Plan :  1. Cough - benzonatate (TESSALON) 100 MG capsule; Take 1-2 capsules (100-200 mg total) by mouth 3 (three) times daily as needed for cough.  Dispense: 40 capsule; Refill: 0 - chlorpheniramine-HYDROcodone (TUSSIONEX PENNKINETIC ER) 10-8 MG/5ML SUER; Take 5 mLs by mouth every 12 (twelve) hours as needed for cough.  Dispense: 100 mL; Refill: 0  2. Maxillary sinusitis, unspecified chronicity - doxycycline (VIBRAMYCIN) 100 MG capsule; Take 1 capsule (100 mg total) by mouth 2 (two) times daily for 7 days.  Dispense: 14 capsule; Refill: 0  3. Shoulder pain, right  4. Chronic right-sided low back pain with right-sided sciatica - cyclobenzaprine (FLEXERIL) 10 MG tablet; Take 1 tablet (10 mg total) by mouth 3 (three) times daily as needed for muscle spasms.  Dispense: 30 tablet; Refill: 0 - pt endorses some relief following trigger point injection. Advised heat, hydration, massage, stretching. RTC in 3-4 weeks if no improvement. Consider PT referral.  5. Tenderness of nipple - No sign of infection. No red flag signs. MM not UTD. Advised pt to schedule MM. RTC if no improvement.   Mercer Pod, PA-C  Primary Care at [place] 05/09/2017 9:27 AM

## 2017-05-13 ENCOUNTER — Telehealth: Payer: Self-pay | Admitting: Student

## 2017-05-13 NOTE — Telephone Encounter (Signed)
Copied from [place] [id number]. Topic: Quick Communication - See Telephone Encounter >> May 13, 2017  8:46 AM Boyd Kerbs wrote: CRM for notification. See Telephone encounter for:  Prescription request for Relpax and she is out. The refills (10) on her prescription and is out of date and can not refill without a new prescription.  She has a Migraine now and is needing this.   [id number] [place], [place] [address] [place] [address] Phone: [phone] Fax: [phone]    05/13/17.

## 2017-05-13 NOTE — Telephone Encounter (Signed)
Patient requesting refill of Relpax, need new prescription due to her current prescription is out of date.

## 2017-05-13 NOTE — Telephone Encounter (Signed)
Pt husband [name] called back for an update on med refill, adv it has been marked high priority and is waiting pending with provider.  He request a call when/if the refill has been done.

## 2017-05-15 ENCOUNTER — Other Ambulatory Visit: Payer: Self-pay | Admitting: Physician Assistant

## 2017-05-15 DIAGNOSIS — G43909 Migraine, unspecified, not intractable, without status migrainosus: Secondary | ICD-10-CM

## 2017-05-15 MED ORDER — ELETRIPTAN HYDROBROMIDE 40 MG PO TABS: 40.0000 mg | ORAL_TABLET | ORAL | 0 refills | Status: DC | PRN

## 2017-05-15 NOTE — Telephone Encounter (Signed)
Please advise 

## 2017-05-15 NOTE — Telephone Encounter (Signed)
Rx printed out. Please call pt and let her know she can pick this up here at the clinic. Thank you!

## 2017-07-17 ENCOUNTER — Ambulatory Visit: Payer: BC Managed Care – PPO | Admitting: Physician Assistant

## 2017-07-19 ENCOUNTER — Encounter: Payer: Self-pay | Admitting: Physician Assistant

## 2017-07-19 ENCOUNTER — Ambulatory Visit: Payer: 59 | Admitting: Physician Assistant

## 2017-07-19 VITALS — BP 121/84 | HR 106 | Temp 98.3°F | Resp 17 | Ht 65.0 in | Wt 149.0 lb

## 2017-07-19 DIAGNOSIS — G43909 Migraine, unspecified, not intractable, without status migrainosus: Secondary | ICD-10-CM | POA: Diagnosis not present

## 2017-07-19 DIAGNOSIS — G2581 Restless legs syndrome: Secondary | ICD-10-CM

## 2017-07-19 DIAGNOSIS — Z1329 Encounter for screening for other suspected endocrine disorder: Secondary | ICD-10-CM | POA: Diagnosis not present

## 2017-07-19 DIAGNOSIS — Z79899 Other long term (current) drug therapy: Secondary | ICD-10-CM | POA: Diagnosis not present

## 2017-07-19 DIAGNOSIS — F411 Generalized anxiety disorder: Secondary | ICD-10-CM

## 2017-07-19 DIAGNOSIS — F329 Major depressive disorder, single episode, unspecified: Secondary | ICD-10-CM

## 2017-07-19 DIAGNOSIS — G5600 Carpal tunnel syndrome, unspecified upper limb: Secondary | ICD-10-CM | POA: Insufficient documentation

## 2017-07-19 DIAGNOSIS — R05 Cough: Secondary | ICD-10-CM

## 2017-07-19 DIAGNOSIS — J302 Other seasonal allergic rhinitis: Secondary | ICD-10-CM

## 2017-07-19 DIAGNOSIS — G8929 Other chronic pain: Secondary | ICD-10-CM

## 2017-07-19 DIAGNOSIS — R059 Cough, unspecified: Secondary | ICD-10-CM

## 2017-07-19 DIAGNOSIS — J449 Chronic obstructive pulmonary disease, unspecified: Secondary | ICD-10-CM | POA: Insufficient documentation

## 2017-07-19 DIAGNOSIS — J45909 Unspecified asthma, uncomplicated: Secondary | ICD-10-CM | POA: Insufficient documentation

## 2017-07-19 DIAGNOSIS — M5441 Lumbago with sciatica, right side: Secondary | ICD-10-CM | POA: Diagnosis not present

## 2017-07-19 DIAGNOSIS — G4700 Insomnia, unspecified: Secondary | ICD-10-CM | POA: Diagnosis not present

## 2017-07-19 DIAGNOSIS — F32A Depression, unspecified: Secondary | ICD-10-CM

## 2017-07-19 DIAGNOSIS — E785 Hyperlipidemia, unspecified: Secondary | ICD-10-CM | POA: Diagnosis not present

## 2017-07-19 LAB — POCT URINALYSIS DIPSTICK
Blood, UA: NEGATIVE
Glucose, UA: NEGATIVE
Leukotest Result: NEGATIVE
Nitrite, UA: NEGATIVE
Protein, UA: 100
Spec Grav, UA: 1.025 (ref 1.010–1.025)
Urobilinogen, UA: 1 E.U./dL
pH, UA: 5.5 (ref 5.0–8.0)

## 2017-07-19 MED ORDER — BENZONATATE 100 MG PO CAPS: 100.0000 mg | ORAL_CAPSULE | Freq: Three times a day (TID) | ORAL | 0 refills | Status: DC | PRN

## 2017-07-19 MED ORDER — TRAZODONE HCL 50 MG PO TABS: 25.0000 mg | ORAL_TABLET | Freq: Every evening | ORAL | 0 refills | Status: DC | PRN

## 2017-07-19 MED ORDER — CITALOPRAM HYDROBROMIDE 20 MG PO TABS: 60.0000 mg | ORAL_TABLET | Freq: Every day | ORAL | 4 refills | Status: DC

## 2017-07-19 MED ORDER — ALBUTEROL SULFATE HFA 108 (90 BASE) MCG/ACT IN AERS: 2.0000 | INHALATION_SPRAY | RESPIRATORY_TRACT | 0 refills | Status: DC | PRN

## 2017-07-19 MED ORDER — ALPRAZOLAM 1 MG PO TABS: 0.5000 mg | ORAL_TABLET | Freq: Every day | ORAL | 0 refills | Status: DC | PRN

## 2017-07-19 MED ORDER — ELETRIPTAN HYDROBROMIDE 40 MG PO TABS: 40.0000 mg | ORAL_TABLET | ORAL | 0 refills | Status: DC | PRN

## 2017-07-19 MED ORDER — MELOXICAM 15 MG PO TABS: 15.0000 mg | ORAL_TABLET | Freq: Every day | ORAL | 0 refills | Status: DC

## 2017-07-19 MED ORDER — FLUTICASONE-SALMETEROL 500-50 MCG/DOSE IN AEPB: 1.0000 | INHALATION_SPRAY | Freq: Two times a day (BID) | RESPIRATORY_TRACT | 1 refills | Status: DC

## 2017-07-19 MED ORDER — CETIRIZINE HCL 10 MG PO TABS: 10.0000 mg | ORAL_TABLET | Freq: Every day | ORAL | 11 refills | Status: DC

## 2017-07-19 MED ORDER — ROPINIROLE HCL 1 MG PO TABS: 1.0000 mg | ORAL_TABLET | Freq: Every day | ORAL | 3 refills | Status: DC

## 2017-07-19 MED ORDER — TRAZODONE HCL 50 MG PO TABS: 50.0000 mg | ORAL_TABLET | Freq: Every day | ORAL | 0 refills | Status: DC

## 2017-07-19 MED ORDER — CYCLOBENZAPRINE HCL 10 MG PO TABS
10.0000 mg | ORAL_TABLET | Freq: Three times a day (TID) | ORAL | 0 refills | Status: DC | PRN
Start: 2017-07-19 — End: 2018-01-17

## 2017-07-19 NOTE — Patient Instructions (Addendum)
Come back and see me in 3 months for your annual exam.    Coping with Quitting Smoking Quitting smoking is a physical and mental challenge. You will face cravings, withdrawal symptoms, and temptation. Before quitting, work with your health care provider to make a plan that can help you cope. Preparation can help you quit and keep you from giving in. How can I cope with cravings? Cravings usually last for 5-10 minutes. If you get through it, the craving will pass. Consider taking the following actions to help you cope with cravings:  Keep your mouth busy: ? Chew sugar-free gum. ? Suck on hard candies or a straw. ? Brush your teeth.  Keep your hands and body busy: ? Immediately change to a different activity when you feel a craving. ? Squeeze or play with a ball. ? Do an activity or a hobby, like making bead jewelry, practicing needlepoint, or working with wood. ? Mix up your normal routine. ? Take a short exercise break. Go for a quick walk or run up and down stairs. ? Spend time in public places where smoking is not allowed.  Focus on doing something kind or helpful for someone else.  Call a friend or family member to talk during a craving.  Join a support group.  Call a quit line, such as 1-800-QUIT-NOW.  Talk with your health care provider about medicines that might help you cope with cravings and make quitting easier for you.  How can I deal with withdrawal symptoms? Your body may experience negative effects as it tries to get used to not having nicotine in the system. These effects are called withdrawal symptoms. They may include:  Feeling hungrier than normal.  Trouble concentrating.  Irritability.  Trouble sleeping.  Feeling depressed.  Restlessness and agitation.  Craving a cigarette.  To manage withdrawal symptoms:  Avoid places, people, and activities that trigger your cravings.  Remember why you want to quit.  Get plenty of sleep.  Avoid coffee  and other caffeinated drinks. These may worsen some of your symptoms.  How can I handle social situations? Social situations can be difficult when you are quitting smoking, especially in the first few weeks. To manage this, you can:  Avoid parties, bars, and other social situations where people might be smoking.  Avoid alcohol.  Leave right away if you have the urge to smoke.  Explain to your family and friends that you are quitting smoking. Ask for understanding and support.  Plan activities with friends or family where smoking is not an option.  What are some ways I can cope with stress? Wanting to smoke may cause stress, and stress can make you want to smoke. Find ways to manage your stress. Relaxation techniques can help. For example:  Breathe slowly and deeply, in through your nose and out through your mouth.  Listen to soothing, relaxing music.  Talk with a family member or friend about your stress.  Light a candle.  Soak in a bath or take a shower.  Think about a peaceful place.  What are some ways I can prevent weight gain? Be aware that many people gain weight after they quit smoking. However, not everyone does. To keep from gaining weight, have a plan in place before you quit and stick to the plan after you quit. Your plan should include:  Having healthy snacks. When you have a craving, it may help to: ? Eat plain popcorn, crunchy carrots, celery, or other cut vegetables. ?  Chew sugar-free gum.  Changing how you eat: ? Eat small portion sizes at meals. ? Eat 4-6 small meals throughout the day instead of 1-2 large meals a day. ? Be mindful when you eat. Do not watch television or do other things that might distract you as you eat.  Exercising regularly: ? Make time to exercise each day. If you do not have time for a long workout, do short bouts of exercise for 5-10 minutes several times a day. ? Do some form of strengthening exercise, like weight lifting, and some  form of aerobic exercise, like running or swimming.  Drinking plenty of water or other low-calorie or no-calorie drinks. Drink 6-8 glasses of water daily, or as much as instructed by your health care provider.  Summary  Quitting smoking is a physical and mental challenge. You will face cravings, withdrawal symptoms, and temptation to smoke again. Preparation can help you as you go through these challenges.  You can cope with cravings by keeping your mouth busy (such as by chewing gum), keeping your body and hands busy, and making calls to family, friends, or a helpline for people who want to quit smoking.  You can cope with withdrawal symptoms by avoiding places where people smoke, avoiding drinks with caffeine, and getting plenty of rest.  Ask your health care provider about the different ways to prevent weight gain, avoid stress, and handle social situations. This information is not intended to replace advice given to you by your health care provider. Make sure you discuss any questions you have with your health care provider. Document Released: 04/27/2016 Document Revised: 04/27/2016 Document Reviewed: 04/27/2016 Elsevier Interactive Patient Education  2018 Elsevier Inc.   Thank you for coming in today. I hope you feel we met your needs.  Feel free to call PCP if you have any questions or further requests.  Please consider signing up for MyChart if you do not already have it, as this is a great way to communicate with me.  Best,  Whitney McVey, PA-C  IF you received an x-ray today, you will receive an invoice from [facility]. Please contact [facility] at [phone] with questions or concerns regarding your invoice.   IF you received labwork today, you will receive an invoice from [facility]. Please contact [facility] at [phone] with questions or concerns regarding your invoice.   Our billing staff will not be able to assist you with questions regarding bills  from these companies.  You will be contacted with the lab results as soon as they are available. The fastest way to get your results is to activate your My Chart account. Instructions are located on the last page of this paperwork. If you have not heard from us regarding the results in 2 weeks, please contact this office.      

## 2017-07-19 NOTE — Progress Notes (Signed)
Vicki [name]  MRN: [id number] DOB: 03-22-1966  PCP: Maury Dus, MD  Subjective:  Pt is a 52 year old female who presents to clinic for medication management.   H/o COPD - albuterol and Advair "I don't think it's strong enough." she is using this twice daily. She is using albuterol once daily. shob walking to mailbox. Tessalon pearls - takes this daily.   Restless leg syndrome - controlled with Relpax.  Insomnia - controlled with trazodone 41m.  HLD - controlled with pravastatin.  Muscle tightness - controlled with flexeril.  Depression and anxiety - controlled with Celexa 256mqd and Xanax 0.2537mThis is working well for her. She states she had to try "like 10 medications before I found one that worked". She takes xanax about 1-2 x/week.  Smoke 4 cigarettes/day. She is working on quitting.  She has Chantix at home she has not started yet. Her daughter is working on quitting, so they are trying to do this together.   Review of Systems  Constitutional: Negative for diaphoresis, fatigue, fever and unexpected weight change.  Respiratory: Positive for cough and shortness of breath. Negative for chest tightness and wheezing.   Cardiovascular: Negative for chest pain, palpitations and leg swelling.  Musculoskeletal: Positive for arthralgias and myalgias. Negative for gait problem and joint swelling.  Psychiatric/Behavioral: Positive for dysphoric mood and sleep disturbance. Negative for self-injury and suicidal ideas. The patient is nervous/anxious.     Patient Active Problem List   Diagnosis Date Noted  . Asthma   . DDD (degenerative disc disease), lumbar 10/22/2016    Current Outpatient Medications on File Prior to Visit  Medication Sig Dispense Refill  . albuterol (PROVENTIL HFA;VENTOLIN HFA) 108 (90 Base) MCG/ACT inhaler Inhale 2 puffs into the lungs every 4 (four) hours as needed for wheezing or shortness of breath. 1 Inhaler 0  . ALPRAZolam (XANAX) 1 MG tablet Take  0.5-1 mg by mouth daily as needed for anxiety or sleep.     . aMarland Kitchenpirin 81 MG tablet Take 81 mg by mouth daily.    . benzonatate (TESSALON) 100 MG capsule Take 1-2 capsules (100-200 mg total) by mouth 3 (three) times daily as needed for cough. 40 capsule 0  . cetirizine (ZYRTEC) 10 MG tablet Take 1 tablet (10 mg total) by mouth daily. 30 tablet 11  . citalopram (CELEXA) 40 MG tablet Take 60 mg by mouth daily.    . cyclobenzaprine (FLEXERIL) 10 MG tablet Take 1 tablet (10 mg total) by mouth 3 (three) times daily as needed for muscle spasms. 30 tablet 0  . eletriptan (RELPAX) 40 MG tablet Take 1 tablet (40 mg total) by mouth as needed for migraine or headache. One tablet by mouth at onset of headache. May repeat in 2 hours if headache persists or recurs. 10 tablet 0  . Fluticasone-Salmeterol (ADVAIR) 250-50 MCG/DOSE AEPB Inhale 1 puff into the lungs 2 (two) times daily.    . iMarland Kitchenuprofen (ADVIL,MOTRIN) 800 MG tablet Take 800 mg by mouth every 8 (eight) hours as needed for mild pain.    . meloxicam (MOBIC) 15 MG tablet TAKE 1 TABLET BY MOUTH EVERY DAY 30 tablet 0  . pravastatin (PRAVACHOL) 40 MG tablet Take 1 tablet by mouth daily.    . rMarland KitchenPINIRole (REQUIP) 1 MG tablet Take 1 mg by mouth 3 (three) times daily.    . traZODone (DESYREL) 50 MG tablet Take 1 tablet by mouth at bedtime.    . varenicline (CHANTIX STARTING MONTH PAK) 0.5  MG X 11 & 1 MG X 42 tablet Take one 0.5 mg tablet by mouth once daily for 3 days, then increase to one 0.5 mg tablet twice daily for 4 days, then increase to one 1 mg tablet twice daily. 53 tablet 0  . glycopyrrolate (ROBINUL) 1 MG tablet every evening.  5   No current facility-administered medications on file prior to visit.     Allergies  Allergen Reactions  . Jasmine Oil Shortness Of Breath and Swelling  . Penicillins Anaphylaxis and Hives  . Aleve [Naproxen Sodium] Itching  . Benadryl [Diphenhydramine] Other (See Comments)    hallucinations  . Ceftin [Cefuroxime  Axetil] Other (See Comments)    unsure  . Depo-Provera [Medroxyprogesterone Acetate] Other (See Comments)    Severe acne  . Oxycodone Nausea And Vomiting  . Tramadol Other (See Comments)    Kept her up all night     Objective:  BP 121/84   Pulse (!) 106   Temp 98.3 F (36.8 C) (Oral)   Resp 17   Ht '5\' 5"'  (1.651 m)   Wt 149 lb (67.6 kg)   SpO2 98%   BMI 24.79 kg/m   Physical Exam  Constitutional: She is oriented to person, place, and time and well-developed, well-nourished, and in no distress. No distress.  Cardiovascular: Normal rate, regular rhythm and normal heart sounds.  Pulmonary/Chest: Effort normal and breath sounds normal. No respiratory distress. She has no wheezes.  Neurological: She is alert and oriented to person, place, and time. GCS score is 15.  Skin: Skin is warm and dry.  Psychiatric: Mood, memory, affect and judgment normal.  Vitals reviewed.  No results found for: CHOL, HDL, LDLCALC, LDLDIRECT, TRIG, CHOLHDL  Assessment and Plan :  1. Hyperlipidemia, unspecified hyperlipidemia type - Lipid panel - Urinalysis Dipstick - Pt presents for refill of multiple medications. There are no lipid results on file for this pt. Routine labs pending. Will contact with results.  2. Screening for endocrine disorder - CMP14+EGFR  3. Chronic obstructive pulmonary disease, unspecified COPD type (HCC) - albuterol (PROVENTIL HFA;VENTOLIN HFA) 108 (90 Base) MCG/ACT inhaler; Inhale 2 puffs into the lungs every 4 (four) hours as needed for wheezing or shortness of breath.  Dispense: 1 Inhaler; Refill: 0 - Fluticasone-Salmeterol (ADVAIR DISKUS) 500-50 MCG/DOSE AEPB; Inhale 1 puff into the lungs 2 (two) times daily.  Dispense: 60 each; Refill: 1 - COPD uncontrolled. Plan to increase dose of Advair. RTC in 3 months for recheck and annual exam. Smoking cessation discussed and encouraged. She has Chantix at home she has not started yet. Her daughter is working on quitting, so they are  trying to do this together.   4. Cough - benzonatate (TESSALON) 100 MG capsule; Take 1-2 capsules (100-200 mg total) by mouth 3 (three) times daily as needed for cough.  Dispense: 40 capsule; Refill: 0  5. Migraine without status migrainosus, not intractable, unspecified migraine type - eletriptan (RELPAX) 40 MG tablet; Take 1 tablet (40 mg total) by mouth as needed for migraine or headache. One tablet by mouth at onset of headache. May repeat in 2 hours if headache persists or recurs.  Dispense: 10 tablet; Refill: 0  6. Chronic right-sided low back pain with right-sided sciatica - cyclobenzaprine (FLEXERIL) 10 MG tablet; Take 1 tablet (10 mg total) by mouth 3 (three) times daily as needed for muscle spasms.  Dispense: 30 tablet; Refill: 0 - meloxicam (MOBIC) 15 MG tablet; Take 1 tablet (15 mg total) by mouth daily.  Dispense: 30 tablet; Refill: 0  7. Generalized anxiety disorder - ALPRAZolam (XANAX) 1 MG tablet; Take 0.5-1 tablets (0.5-1 mg total) by mouth daily as needed for anxiety or sleep.  Dispense: 30 tablet; Refill: 0  8. Depression, unspecified depression type - citalopram (CELEXA) 20 MG tablet; Take 3 tablets (60 mg total) by mouth daily.  Dispense: 90 tablet; Refill: 4 - Controlled. Denies SI or HI.  9. Seasonal allergies - cetirizine (ZYRTEC) 10 MG tablet; Take 1 tablet (10 mg total) by mouth daily.  Dispense: 30 tablet; Refill: 11  10. RLS (restless legs syndrome) - rOPINIRole (REQUIP) 1 MG tablet; Take 1 tablet (1 mg total) by mouth at bedtime.  Dispense: 30 tablet; Refill: 3  11. Insomnia, unspecified type - traZODone (DESYREL) 50 MG tablet; Take 1 tablet (50 mg total) by mouth at bedtime.  Dispense: 30 tablet; Refill: 0 - OK to fill 3 months.   12. Encounter for medication management - Concern for polypharmacy. Plan to discuss this further with pt at her annual exam in 3 months.   Mercer Pod, PA-C  [facility] 07/19/2017 9:40  AM

## 2017-07-20 LAB — CMP14+EGFR
ALT: 31 IU/L (ref 0–32)
AST: 78 IU/L — ABNORMAL HIGH (ref 0–40)
Albumin/Globulin Ratio: 1.7 (ref 1.2–2.2)
Albumin: 5 g/dL (ref 3.5–5.5)
Alkaline Phosphatase: 120 IU/L — ABNORMAL HIGH (ref 39–117)
BUN/Creatinine Ratio: 17 (ref 9–23)
BUN: 14 mg/dL (ref 6–24)
Bilirubin Total: 0.3 mg/dL (ref 0.0–1.2)
CO2: 26 mmol/L (ref 20–29)
Calcium: 9.6 mg/dL (ref 8.7–10.2)
Chloride: 95 mmol/L — ABNORMAL LOW (ref 96–106)
Creatinine, Ser: 0.83 mg/dL (ref 0.57–1.00)
GFR calc Af Amer: 96 mL/min/{1.73_m2} (ref >59)
GFR calc non Af Amer: 83 mL/min/{1.73_m2} (ref >59)
Globulin, Total: 2.9 g/dL (ref 1.5–4.5)
Glucose: 119 mg/dL — ABNORMAL HIGH (ref 65–99)
Potassium: 3.6 mmol/L (ref 3.5–5.2)
Sodium: 139 mmol/L (ref 134–144)
Total Protein: 7.9 g/dL (ref 6.0–8.5)

## 2017-07-20 LAB — LIPID PANEL
Chol/HDL Ratio: 4.4 ratio (ref 0.0–4.4)
Cholesterol, Total: 304 mg/dL — ABNORMAL HIGH (ref 100–199)
HDL: 69 mg/dL (ref >39)
Triglycerides: 556 mg/dL (ref 0–149)

## 2017-07-23 ENCOUNTER — Other Ambulatory Visit: Payer: Self-pay | Admitting: Physician Assistant

## 2017-07-23 ENCOUNTER — Encounter: Payer: Self-pay | Admitting: Physician Assistant

## 2017-07-23 DIAGNOSIS — E785 Hyperlipidemia, unspecified: Secondary | ICD-10-CM

## 2017-07-23 NOTE — Progress Notes (Signed)
Please call pt and have her come in for a lab only visit. Her cholesterol levels were very high and I'd like to recheck a fasting cholesterol panel. Please be fasting - do not eat or drink anything except black coffee and water for 8 hours prior to coming in. She can do this at her earliest convenience. Thank you!

## 2017-08-09 ENCOUNTER — Other Ambulatory Visit: Payer: Self-pay | Admitting: Physician Assistant

## 2017-08-09 DIAGNOSIS — J449 Chronic obstructive pulmonary disease, unspecified: Secondary | ICD-10-CM

## 2017-08-13 ENCOUNTER — Encounter: Payer: Self-pay | Admitting: Physician Assistant

## 2017-09-04 ENCOUNTER — Encounter: Payer: Self-pay | Admitting: Physician Assistant

## 2017-09-04 ENCOUNTER — Ambulatory Visit (INDEPENDENT_AMBULATORY_CARE_PROVIDER_SITE_OTHER): Payer: 59 | Admitting: Physician Assistant

## 2017-09-04 VITALS — BP 132/82 | HR 146 | Temp 98.2°F | Resp 17 | Ht 65.0 in | Wt 157.0 lb

## 2017-09-04 DIAGNOSIS — R000 Tachycardia, unspecified: Secondary | ICD-10-CM

## 2017-09-04 DIAGNOSIS — E782 Mixed hyperlipidemia: Secondary | ICD-10-CM | POA: Diagnosis not present

## 2017-09-04 DIAGNOSIS — J329 Chronic sinusitis, unspecified: Secondary | ICD-10-CM

## 2017-09-04 DIAGNOSIS — R05 Cough: Secondary | ICD-10-CM

## 2017-09-04 DIAGNOSIS — R059 Cough, unspecified: Secondary | ICD-10-CM

## 2017-09-04 LAB — POCT CBC
Granulocyte percent: 66.3 %G (ref 37–80)
HCT, POC: 45.3 % (ref 37.7–47.9)
Hemoglobin: 15.3 g/dL (ref 12.2–16.2)
Lymph, poc: 2.6 (ref 0.6–3.4)
MCH, POC: 31.4 pg — AB (ref 27–31.2)
MCHC: 33.9 g/dL (ref 31.8–35.4)
MCV: 92.6 fL (ref 80–97)
MID (cbc): 0.4 (ref 0–0.9)
MPV: 7.4 fL (ref 0–99.8)
POC Granulocyte: 6 (ref 2–6.9)
POC LYMPH PERCENT: 29.1 %L (ref 10–50)
POC MID %: 4.6 %M (ref 0–12)
Platelet Count, POC: 270 10*3/uL (ref 142–424)
RBC: 4.89 M/uL (ref 4.04–5.48)
RDW, POC: 13.4 %
WBC: 9 10*3/uL (ref 4.6–10.2)

## 2017-09-04 MED ORDER — DOXYCYCLINE HYCLATE 100 MG PO TABS: 100.0000 mg | ORAL_TABLET | Freq: Two times a day (BID) | ORAL | 0 refills | Status: DC

## 2017-09-04 MED ORDER — HYDROCODONE-HOMATROPINE 5-1.5 MG/5ML PO SYRP: 5.0000 mL | ORAL_SOLUTION | Freq: Three times a day (TID) | ORAL | 0 refills | Status: DC | PRN

## 2017-09-04 NOTE — Progress Notes (Signed)
Vicki [name]  MRN: [id number] DOB: 11-08-1965  PCP: Patient, No Pcp Per  Subjective:  Pt is a 52 year old female who presents to clinic for sinus pressure. Symptoms started with a bad HA >1 week ago. symptoms have been gradually worsening and now include hoarse voice, and chills. She has been taking Tylenol cold and sinus, tussionex.   Review of Systems  Constitutional: Positive for fatigue. Negative for chills, diaphoresis and fever.  HENT: Positive for congestion, sinus pressure, sinus pain and voice change. Negative for postnasal drip, rhinorrhea and sore throat.   Respiratory: Positive for cough. Negative for shortness of breath and wheezing.   Psychiatric/Behavioral: Negative for sleep disturbance.    Patient Active Problem List   Diagnosis Date Noted  . Polypharmacy 07/19/2017  . Asthma   . Carpal tunnel syndrome   . COPD (chronic obstructive pulmonary disease) ([place])   . DDD (degenerative disc disease), lumbar 10/22/2016    Current Outpatient Medications on File Prior to Visit  Medication Sig Dispense Refill  . albuterol (PROVENTIL HFA;VENTOLIN HFA) 108 (90 Base) MCG/ACT inhaler Inhale 2 puffs into the lungs every 4 (four) hours as needed for wheezing or shortness of breath. 1 Inhaler 0  . ALPRAZolam (XANAX) 1 MG tablet Take 0.5-1 tablets (0.5-1 mg total) by mouth daily as needed for anxiety or sleep. 30 tablet 0  . aspirin 81 MG tablet Take 81 mg by mouth daily.    . benzonatate (TESSALON) 100 MG capsule Take 1-2 capsules (100-200 mg total) by mouth 3 (three) times daily as needed for cough. 40 capsule 0  . cetirizine (ZYRTEC) 10 MG tablet Take 1 tablet (10 mg total) by mouth daily. 30 tablet 11  . citalopram (CELEXA) 20 MG tablet Take 3 tablets (60 mg total) by mouth daily. 90 tablet 4  . cyclobenzaprine (FLEXERIL) 10 MG tablet Take 1 tablet (10 mg total) by mouth 3 (three) times daily as needed for muscle spasms. 30 tablet 0  . eletriptan (RELPAX) 40 MG tablet Take  1 tablet (40 mg total) by mouth as needed for migraine or headache. One tablet by mouth at onset of headache. May repeat in 2 hours if headache persists or recurs. 10 tablet 0  . Fluticasone-Salmeterol (ADVAIR DISKUS) 500-50 MCG/DOSE AEPB Inhale 1 puff into the lungs 2 (two) times daily. 60 each 1  . glycopyrrolate (ROBINUL) 1 MG tablet every evening.  5  . ibuprofen (ADVIL,MOTRIN) 800 MG tablet Take 800 mg by mouth every 8 (eight) hours as needed for mild pain.    . meloxicam (MOBIC) 15 MG tablet Take 1 tablet (15 mg total) by mouth daily. 30 tablet 0  . pravastatin (PRAVACHOL) 40 MG tablet Take 1 tablet by mouth daily.    Marland Kitchen rOPINIRole (REQUIP) 1 MG tablet Take 1 tablet (1 mg total) by mouth at bedtime. 30 tablet 3  . traZODone (DESYREL) 50 MG tablet Take 1 tablet (50 mg total) by mouth at bedtime. 30 tablet 0  . [START ON 09/18/2017] traZODone (DESYREL) 50 MG tablet Take 0.5-1 tablets (25-50 mg total) by mouth at bedtime as needed for sleep. 30 tablet 0  . traZODone (DESYREL) 50 MG tablet Take 0.5-1 tablets (25-50 mg total) by mouth at bedtime as needed for sleep. 30 tablet 0  . varenicline (CHANTIX STARTING MONTH PAK) 0.5 MG X 11 & 1 MG X 42 tablet Take one 0.5 mg tablet by mouth once daily for 3 days, then increase to one 0.5 mg tablet twice daily  for 4 days, then increase to one 1 mg tablet twice daily. 53 tablet 0   No current facility-administered medications on file prior to visit.     Allergies  Allergen Reactions  . Jasmine Oil Shortness Of Breath and Swelling  . Penicillins Anaphylaxis and Hives  . Aleve [Naproxen Sodium] Itching  . Benadryl [Diphenhydramine] Other (See Comments)    hallucinations  . Ceftin [Cefuroxime Axetil] Other (See Comments)    unsure  . Depo-Provera [Medroxyprogesterone Acetate] Other (See Comments)    Severe acne  . Oxycodone Nausea And Vomiting  . Tramadol Other (See Comments)    Kept her up all night     Objective:  BP 132/82   Pulse (!) 146    Temp 98.2 F (36.8 C) (Oral)   Resp 17   Ht 5\' 5"  (1.651 m)   Wt 157 lb (71.2 kg)   SpO2 98%   BMI 26.13 kg/m   Physical Exam  Results for orders placed or performed in visit on 09/04/17  POCT CBC  Result Value Ref Range   WBC 9.0 4.6 - 10.2 K/uL   Lymph, poc 2.6 0.6 - 3.4   POC LYMPH PERCENT 29.1 10 - 50 %L   MID (cbc) 0.4 0 - 0.9   POC MID % 4.6 0 - 12 %M   POC Granulocyte 6.0 2 - 6.9   Granulocyte percent 66.3 37 - 80 %G   RBC 4.89 4.04 - 5.48 M/uL   Hemoglobin 15.3 12.2 - 16.2 g/dL   HCT, POC 45.3 37.7 - 47.9 %   MCV 92.6 80 - 97 fL   MCH, POC 31.4 (A) 27 - 31.2 pg   MCHC 33.9 31.8 - 35.4 g/dL   RDW, POC 13.4 %   Platelet Count, POC 270 142 - 424 K/uL   MPV 7.4 0 - 99.8 fL    Assessment and Plan :  1. Sinusitis, unspecified chronicity, unspecified location 2. Tachycardia - POCT CBC - doxycycline (VIBRA-TABS) 100 MG tablet; Take 1 tablet (100 mg total) by mouth 2 (two) times daily.  Dispense: 14 tablet; Refill: 0 - pt presents for 10 days of worsening sinus pressure. Plan to cover with Doxycycline. Advised nasal rinses and mucinex. RTC if no improvement in 5-7 days.  3. Cough - HYDROcodone-homatropine (HYCODAN) 5-1.5 MG/5ML syrup; Take 5 mLs by mouth every 8 (eight) hours as needed for cough.  Dispense: 120 mL; Refill: 0  4. Elevated triglycerides with high cholesterol - Lipid panel - pt was here for annual exam and asked to come back for redraw lipids, but she never did. She is not fasting today.   Mercer Pod, PA-C  [facility] 09/04/2017 2:39 PM

## 2017-09-04 NOTE — Patient Instructions (Addendum)
Start taking Doxycycline twice daily for 7 days.  Hycodan is to help you sleep. This may make you drowsy.  Continue taking Mucinex every 12 hours for the next 4-5 days.  Continue using your saline nasal rinses.   Stop smoking until you feel better. After that, work on cutting back on smoking.  Limit the amount you are talking until you are better.  Stay well hydrated. Get lost of rest. Wash your hands often.   -Foods that can help speed recovery: honey, garlic, chicken soup, elderberries, green tea.  -Supplements that can help speed recovery: vitamin C, zinc, elderberry extract, quercetin, ginseng, selenium -Supplement with prebiotics and probiotics:   Advil or ibuprofen for pain. Do not take Aspirin.   For sore throat: ? Gargle with 8 oz of salt water ( tsp of salt per 1 qt of water) as often as every 1-2 hours to soothe your throat.  Gargle liquid benadryl.  Cepacol throat lozenges .  For sore throat try using a honey-based tea. Use 3 teaspoons of honey with juice squeezed from half lemon. Place shaved pieces of ginger into 1/2-1 cup of water and warm over stove top. Then mix the ingredients and repeat every 4 hours as needed.  Cough Syrup Recipe: Sweet Lemon & Honey Thyme  Ingredients a handful of fresh thyme sprigs   1 pint of water (2 cups)  1/2 cup honey (raw is best, but regular will do)  1/2 lemon chopped Instructions 1. Place the lemon in the pint jar and cover with the honey. The honey will macerate the lemons and draw out liquids which taste so delicious! 2. Meanwhile, toss the thyme leaves into a saucepan and cover them with the water. 3. Bring the water to a gentle simmer and reduce it to half, about a cup of tea. 4. When the tea is reduced and cooled a bit, strain the sprigs & leaves, add it into the pint jar and stir it well. 5. Give it a shake and use a spoonful as needed. 6. Store your homemade cough syrup in the refrigerator for about a month.   Is there  anything I can do on my own to get rid of my cough? Yes. To help get rid of your cough, you can: ?Use a humidifier in your bedroom ?Use an over-the-counter cough medicine, or suck on cough drops or hard candy ?Stop smoking, if you smoke ?If you have allergies, avoid the things you are allergic to (like pollen, dust, animals, or mold) If you have acid reflux, your doctor or nurse will tell you which lifestyle changes can help reduce symptoms.    Thank you for coming in today. I hope you feel we met your needs.  Feel free to call PCP if you have any questions or further requests.  Please consider signing up for MyChart if you do not already have it, as this is a great way to communicate with me.  Best,  Whitney McVey, PA-C  IF you received an x-ray today, you will receive an invoice from [facility]. Please contact [facility] at [phone] with questions or concerns regarding your invoice.   IF you received labwork today, you will receive an invoice from [facility]. Please contact [facility] at [phone] with questions or concerns regarding your invoice.   Our billing staff will not be able to assist you with questions regarding bills from these companies.  You will be contacted with the lab results as soon as they are available. The fastest  way to get your results is to activate your My Chart account. Instructions are located on the last page of this paperwork. If you have not heard from [place] regarding the results in 2 weeks, please contact this office.

## 2017-09-05 LAB — LIPID PANEL
Chol/HDL Ratio: 5.9 ratio — ABNORMAL HIGH (ref 0.0–4.4)
Cholesterol, Total: 326 mg/dL — ABNORMAL HIGH (ref 100–199)
HDL: 55 mg/dL (ref >39)
Triglycerides: 1275 mg/dL (ref 0–149)

## 2017-09-11 ENCOUNTER — Encounter: Payer: Self-pay | Admitting: Physician Assistant

## 2017-09-11 ENCOUNTER — Other Ambulatory Visit: Payer: Self-pay | Admitting: Physician Assistant

## 2017-09-11 DIAGNOSIS — E782 Mixed hyperlipidemia: Secondary | ICD-10-CM

## 2017-09-11 NOTE — Progress Notes (Signed)
Please call pt and let her know that her triglyceride levels are incredibly high. This is concerning, as extremely high triglycerides can injure your pancrease. Please come back as soon as possible for a LAB ONLY visit to recheck blood work. Please be fasting - do not eat or drink anything for at least 8 hours prior to the lab draw. You can have water and/or black coffee. Thank you.

## 2017-09-12 ENCOUNTER — Ambulatory Visit (INDEPENDENT_AMBULATORY_CARE_PROVIDER_SITE_OTHER): Payer: 59 | Admitting: Physician Assistant

## 2017-09-12 ENCOUNTER — Other Ambulatory Visit: Payer: Self-pay

## 2017-09-12 ENCOUNTER — Encounter: Payer: Self-pay | Admitting: Physician Assistant

## 2017-09-12 VITALS — BP 128/85 | HR 97 | Temp 98.9°F | Resp 20 | Ht 64.41 in | Wt 156.8 lb

## 2017-09-12 DIAGNOSIS — F17200 Nicotine dependence, unspecified, uncomplicated: Secondary | ICD-10-CM | POA: Diagnosis not present

## 2017-09-12 DIAGNOSIS — E785 Hyperlipidemia, unspecified: Secondary | ICD-10-CM

## 2017-09-12 DIAGNOSIS — R030 Elevated blood-pressure reading, without diagnosis of hypertension: Secondary | ICD-10-CM | POA: Diagnosis not present

## 2017-09-12 MED ORDER — ATORVASTATIN CALCIUM 20 MG PO TABS: 20.0000 mg | ORAL_TABLET | Freq: Every day | ORAL | 3 refills | Status: DC

## 2017-09-12 NOTE — Patient Instructions (Addendum)
It was a pleasure meeting you today.  I am glad to see that your blood pressure is doing better.  In terms of elevated cholesterol, I recommend discontinuing pravastatin and starting atorvastatin, as it may work better.  I have also given education below about foods high in cholesterol.  If you ever start to feel bad again, please check your blood pressure and document that value.  If it is elevated > 140/90, please seek care immediately at the office.  When you follow-up with PA McVey in 1 month, please bring your home blood pressure cuff to that visit so we can compare to our in office values.  In terms of your elevated triglycerides, if you develop any abdominal pain, nausea, or vomiting, please seek care immediately.  Preventing High Cholesterol Cholesterol is a waxy, fat-like substance that your body needs in small amounts. Your liver makes all the cholesterol that your body needs. Having high cholesterol (hypercholesterolemia) increases your risk for heart disease and stroke. Extra (excess) cholesterol comes from the food you eat, such as animal-based fat (saturated fat) from meat and some dairy products. High cholesterol can often be prevented with diet and lifestyle changes. If you already have high cholesterol, you can control it with diet and lifestyle changes, as well as medicine. What nutrition changes can be made?  Eat less saturated fat. Foods that contain saturated fat include red meat and some dairy products.  Avoid processed meats, like bacon and lunch meats.  Avoid trans fats, which are found in margarine and some baked goods.  Avoid foods and beverages that have added sugars.  Eat more fruits, vegetables, and whole grains.  Choose healthy sources of protein, such as fish, poultry, and nuts.  Choose healthy sources of fat, such as: ? Nuts. ? Vegetable oils, especially olive oil. ? Fish that have healthy fats (omega-3 fatty acids), such as mackerel or salmon. What  lifestyle changes can be made?  Lose weight if you are overweight. Losing 5-10 lb (2.3-4.5 kg) can help prevent or control high cholesterol and reduce your risk for diabetes and high blood pressure. Ask your health care provider to help you with a diet and exercise plan to safely lose weight.  Get enough exercise. Do at least 150 minutes of moderate-intensity exercise each week. ? You could do this in short exercise sessions several times a day, or you could do longer exercise sessions a few times a week. For example, you could take a brisk 10-minute walk or bike ride, 3 times a day, for 5 days a week.  Do not smoke. If you need help quitting, ask your health care provider.  Limit your alcohol intake. If you drink alcohol, limit alcohol intake to no more than 1 drink a day for nonpregnant women and 2 drinks a day for men. One drink equals 12 oz of beer, 5 oz of wine, or 1 oz of hard liquor. Why are these changes important? If you have high cholesterol, deposits (plaques) may build up on the walls of your blood vessels. Plaques make the arteries narrower and stiffer, which can restrict or block blood flow and cause blood clots to form. This greatly increases your risk for heart attack and stroke. Making diet and lifestyle changes can reduce your risk for these life-threatening conditions. What can I do to lower my risk?  Manage your risk factors for high cholesterol. Talk with your health care provider about all of your risk factors and how to lower your risk.  Manage other conditions that you have, such as diabetes or high blood pressure (hypertension).  Have your cholesterol checked at regular intervals.  Keep all follow-up visits as told by your health care provider. This is important. How is this treated? In addition to diet and lifestyle changes, your health care provider may recommend medicines to help lower cholesterol, such as a medicine to reduce the amount of cholesterol made in your  liver. You may need medicine if:  Diet and lifestyle changes do not lower your cholesterol enough.  You have high cholesterol and other risk factors for heart disease or stroke.  Take over-the-counter and prescription medicines only as told by your health care provider. Where to find more information:  American Heart Association: ThisTune.com.pt.jsp  National Heart, Lung, and Blood Institute: FrenchToiletries.com.cy Summary  High cholesterol increases your risk for heart disease and stroke. By keeping your cholesterol level low, you can reduce your risk for these conditions.  Diet and lifestyle changes are the most important steps in preventing high cholesterol.  Work with your health care provider to manage your risk factors, and have your blood tested regularly. This information is not intended to replace advice given to you by your health care provider. Make sure you discuss any questions you have with your health care provider. Document Released: 05/15/2015 Document Revised: 01/07/2016 Document Reviewed: 01/07/2016 Elsevier Interactive Patient Education  2018 [place].  Preventing Hypertension Hypertension, commonly called high blood pressure, is when the force of blood pumping through the arteries is too strong. Arteries are blood vessels that carry blood from the heart throughout the body. Over time, hypertension can damage the arteries and decrease blood flow to important parts of the body, including the brain, heart, and kidneys. Often, hypertension does not cause symptoms until blood pressure is very high. For this reason, it is important to have your blood pressure checked on a regular basis. Hypertension can often be prevented with diet and lifestyle changes. If you already have hypertension, you can control it with diet and lifestyle changes, as well as  medicine. What nutrition changes can be made? Maintain a healthy diet. This includes:  Eating less salt (sodium). Ask your health care provider how much sodium is safe for you to have. The general recommendation is to consume less than 1 tsp (2,300 mg) of sodium a day. ? Do not add salt to your food. ? Choose low-sodium options when grocery shopping and eating out.  Limiting fats in your diet. You can do this by eating low-fat or fat-free dairy products and by eating less red meat.  Eating more fruits, vegetables, and whole grains. Make a goal to eat: ? 1-2 cups of fresh fruits and vegetables each day. ? 3-4 servings of whole grains each day.  Avoiding foods and beverages that have added sugars.  Eating fish that contain healthy fats (omega-3 fatty acids), such as mackerel or salmon.  If you need help putting together a healthy eating plan, try the DASH diet. This diet is high in fruits, vegetables, and whole grains. It is low in sodium, red meat, and added sugars. DASH stands for Dietary Approaches to Stop Hypertension. What lifestyle changes can be made?  Lose weight if you are overweight. Losing just 3?5% of your body weight can help prevent or control hypertension. ? For example, if your present weight is 200 lb (91 kg), a loss of 3-5% of your weight means losing 6-10 lb (2.7-4.5 kg). ? Ask your health care provider to help  you with a diet and exercise plan to safely lose weight.  Get enough exercise. Do at least 150 minutes of moderate-intensity exercise each week. ? You could do this in short exercise sessions several times a day, or you could do longer exercise sessions a few times a week. For example, you could take a brisk 10-minute walk or bike ride, 3 times a day, for 5 days a week.  Find ways to reduce stress, such as exercising, meditating, listening to music, or taking a yoga class. If you need help reducing stress, ask your health care provider.  Do not smoke. This  includes e-cigarettes. Chemicals in tobacco and nicotine products raise your blood pressure each time you smoke. If you need help quitting, ask your health care provider.  Avoid alcohol. If you drink alcohol, limit alcohol intake to no more than 1 drink a day for nonpregnant women and 2 drinks a day for men. One drink equals 12 oz of beer, 5 oz of wine, or 1 oz of hard liquor. Why are these changes important? Diet and lifestyle changes can help you prevent hypertension, and they may make you feel better overall and improve your quality of life. If you have hypertension, making these changes will help you control it and help prevent major complications, such as:  Hardening and narrowing of arteries that supply blood to: ? Your heart. This can cause a heart attack. ? Your brain. This can cause a stroke. ? Your kidneys. This can cause kidney failure.  Stress on your heart muscle, which can cause heart failure.  What can I do to lower my risk?  Work with your health care provider to make a hypertension prevention plan that works for you. Follow your plan and keep all follow-up visits as told by your health care provider.  Learn how to check your blood pressure at home. Make sure that you know your personal target blood pressure, as told by your health care provider. How is this treated? In addition to diet and lifestyle changes, your health care provider may recommend medicines to help lower your blood pressure. You may need to try a few different medicines to find what works best for you. You also may need to take more than one medicine. Take over-the-counter and prescription medicines only as told by your health care provider. Where to find support: Your health care provider can help you prevent hypertension and help you keep your blood pressure at a healthy level. Your local hospital or your community may also provide support services and prevention programs. The American Heart Association  offers an online support network at: CheapBootlegs.com.cy Where to find more information: Learn more about hypertension from:  National Heart, Lung, and Blood Institute: ElectronicHangman.is  Centers for Disease Control and Prevention: https://ingram.com/  American Academy of Family Physicians: http://familydoctor.org/familydoctor/en/diseases-conditions/high-blood-pressure.printerview.all.html  Learn more about the DASH diet from:  [place] [place]: https://www.reyes.com/  Contact a health care provider if:  You think you are having a reaction to medicines you have taken.  You have recurrent headaches or feel dizzy.  You have swelling in your ankles.  You have trouble with your vision. Summary  Hypertension often does not cause any symptoms until blood pressure is very high. It is important to get your blood pressure checked regularly.  Diet and lifestyle changes are the most important steps in preventing hypertension.  By keeping your blood pressure in a healthy range, you can prevent complications like heart attack, heart failure, stroke,  and kidney failure.  Work with your health care provider to make a hypertension prevention plan that works for you. This information is not intended to replace advice given to you by your health care provider. Make sure you discuss any questions you have with your health care provider. Document Released: 05/15/2015 Document Revised: 01/09/2016 Document Reviewed: 01/09/2016 Elsevier Interactive Patient Education  2018 [employer].   IF you received an x-ray today, you will receive an invoice from [facility]. Please contact [facility] at [phone] with questions or concerns regarding your invoice.   IF you received labwork today, you will receive an invoice from [place]. Please contact [facility] at  [phone] with questions or concerns regarding your invoice.   Our billing staff will not be able to assist you with questions regarding bills from these companies.  You will be contacted with the lab results as soon as they are available. The fastest way to get your results is to activate your My Chart account. Instructions are located on the last page of this paperwork. If you have not heard from [place] regarding the results in 2 weeks, please contact this office.

## 2017-09-12 NOTE — Progress Notes (Signed)
MRN: [id number] DOB: 1966-04-18  Subjective:   Vicki [name] is a 52 y.o. female presenting for follow up on elevated bp readings without PMH of HTN.  She is fasting today. Woke up last week and felt really bad. Had just stated doxycycline. Her bp reading 184/140. Continued to have systolic readings in 676H-209O. Had one reading in 200s. Just stayed at home because she did not want to go to doctor. Had headache and dizziness. Started feeling better yesterday. No symptoms today. Completed doxy 2 days ago. Today, denies lightheadedness, dizziness, chronic headache, double vision, chest pain, shortness of breath, heart racing, palpitations, nausea, vomiting, abdominal pain, hematuria, lower leg swelling. Of note, pt has PMH of dyslipidemia. On pravastatin 71m daily. Had lipid panel collected 8 days ago. Had eaten country style steak, gravy, biscuits and okra before last lipid panel. TC elevated at 326, triglycerides elevated at 1,275. She denies abdominal pain, nausea, and vomiting. Does eat a fair amount of fast food. No structured exercise. Smokes 0.5 ppd. Drinks alcohol occasionally. No PMH of CVD, diabetes, or pancreatitis. Reports PSH of cholecystectomy.  MShauntehas a current medication list which includes the following prescription(s): albuterol, alprazolam, aspirin, benzonatate, cetirizine, citalopram, cyclobenzaprine, eletriptan, fluticasone-salmeterol, glycopyrrolate, ibuprofen, meloxicam, pravastatin, ropinirole, trazodone, trazodone, trazodone, varenicline, doxycycline, and hydrocodone-homatropine. Also is allergic to jasmine oil; penicillins; aleve [naproxen sodium]; benadryl [diphenhydramine]; ceftin [cefuroxime axetil]; depo-provera [medroxyprogesterone acetate]; oxycodone; and tramadol.  MMerrill has a past medical history of Asthma, Carpal tunnel syndrome, COPD (chronic obstructive pulmonary disease) ([place], and Diverticulitis. Also  has a past surgical history that includes  Bunionectomy (2006); Arthroscopic repair ACL (Left, 2011); Appendectomy; Abdominal hysterectomy (2009); Dilation and curettage of uterus; Tubal ligation (2004); and Carpal tunnel release (Bilateral, 10/28/2013).    Social History   Socioeconomic History  . Marital status: Married    Spouse name: WPatrick Jupiter . Number of children: 3  . Years of education: 12th grade  . Highest education level: Not on file  Occupational History  . 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 Social Needs  . Financial resource strain: Not on file  . Food insecurity:    Worry: Not on file    Inability: Not on file  . Transportation needs:    Medical: Not on file    Non-medical: Not on file  Tobacco Use  . Smoking status: Current Every Day Smoker    Packs/day: 0.50    Years: 34.00    Pack years: 17.00  . Smokeless tobacco: Never Used  Substance and Sexual Activity  . Alcohol use: Yes    Comment: occ  . Drug use: No  . Sexual activity: Not on file  Lifestyle  . Physical activity:    Days per week: Not on file    Minutes per session: Not on file  . Stress: Not on file  Relationships  . Social connections:    Talks on phone: Not on file    Gets together: Not on file    Attends religious service: Not on file    Active member of club or organization: Not on file    Attends meetings of clubs or organizations: Not on file    Relationship status: Not on file  . Intimate partner violence:    Fear of current or ex partner: Not on file    Emotionally abused: Not on file    Physically abused: Not on file    Forced sexual activity: Not on file  Other Topics Concern  . Not on file  Social History Narrative   Lives with her husband.   Their 3 children live independently.    Objective:   Vitals: BP 128/85 (BP Location: Right Arm, Patient Position: Sitting, Cuff Size: Normal)   Pulse 97   Temp 98.9 F (37.2 C) (Oral)   Resp 20   Ht 5' 4.41" (1.636 m)   Wt 156 lb 12.8 oz (71.1 kg)    SpO2 94%   BMI 26.57 kg/m   Physical Exam  Constitutional: She is oriented to person, place, and time. She appears well-developed and well-nourished. No distress.  HENT:  Head: Normocephalic and atraumatic.  Mouth/Throat: Uvula is midline, oropharynx is clear and moist and mucous membranes are normal.  Eyes: Pupils are equal, round, and reactive to light. Conjunctivae and EOM are normal.  Neck: Normal range of motion.  Cardiovascular: Normal rate, regular rhythm, normal heart sounds and intact distal pulses.  Pulmonary/Chest: Effort normal and breath sounds normal. She has no decreased breath sounds. She has no wheezes. She has no rhonchi. She has no rales.  Abdominal: Soft. Normal appearance and bowel sounds are normal. She exhibits no distension. There is no tenderness. There is no rigidity and no guarding.  Musculoskeletal:       Right lower leg: She exhibits no swelling.       Left lower leg: She exhibits no swelling.  Neurological: She is alert and oriented to person, place, and time.  Skin: Skin is warm and dry.  Psychiatric: She has a normal mood and affect.  Vitals reviewed.   No results found for this or any previous visit (from the past 24 hour(s)).  BP Readings from Last 3 Encounters:  09/12/17 128/85  09/04/17 132/82  07/19/17 121/84    Assessment and Plan :  1. Elevated blood pressure reading Well controlled in office today. She is asx. No acute PE findings. Could have been associated with doxycycline use. Encouraged pt to bring home bp cuff to office so we can compare it to our in office readings. Return if she ever gets consistent readings >140/90. Given strict ED precautions.   2. Dyslipidemia Pt is asx. She is fasting today. Labs pending. Recommend d/c pravastatin and starting atorvastatin, as it is more effective at lowering cholesterol levels. Discussed dietary changes to help lower cholesterol. Will contact pt with results. Educated pt on potential  complications of elevated triglycerides, such as pancreatitis. Given strict ED precautions if she develops any concerning sx.   - Lipase - Amylase - CMP14+EGFR - TSH - Lipid panel  3. Current smoker Discussed smoking cessation. She just started Chantix. Educated on her benefits of smoking cessation. She is motivated to stop.    Meds ordered this encounter  Medications  . atorvastatin (LIPITOR) 20 MG tablet    Sig: Take 1 tablet (20 mg total) by mouth daily.    Dispense:  90 tablet    Refill:  3    Order Specific Question:   Supervising Provider    Answer:   Reginia Forts M [[id number]]    Tenna Delaine, PA-C  [facility] 09/12/2017 9:47 AM

## 2017-09-13 ENCOUNTER — Encounter: Payer: Self-pay | Admitting: Physician Assistant

## 2017-09-13 LAB — CMP14+EGFR
ALT: 20 IU/L (ref 0–32)
AST: 38 IU/L (ref 0–40)
Albumin/Globulin Ratio: 1.8 (ref 1.2–2.2)
Albumin: 4.4 g/dL (ref 3.5–5.5)
Alkaline Phosphatase: 136 IU/L — ABNORMAL HIGH (ref 39–117)
BUN/Creatinine Ratio: 18 (ref 9–23)
BUN: 12 mg/dL (ref 6–24)
Bilirubin Total: 0.4 mg/dL (ref 0.0–1.2)
CO2: 25 mmol/L (ref 20–29)
Calcium: 9.4 mg/dL (ref 8.7–10.2)
Chloride: 99 mmol/L (ref 96–106)
Creatinine, Ser: 0.65 mg/dL (ref 0.57–1.00)
GFR calc Af Amer: 121 mL/min/{1.73_m2} (ref >59)
GFR calc non Af Amer: 105 mL/min/{1.73_m2} (ref >59)
Globulin, Total: 2.5 g/dL (ref 1.5–4.5)
Glucose: 100 mg/dL — ABNORMAL HIGH (ref 65–99)
Potassium: 4.1 mmol/L (ref 3.5–5.2)
Sodium: 141 mmol/L (ref 134–144)
Total Protein: 6.9 g/dL (ref 6.0–8.5)

## 2017-09-13 LAB — TSH: TSH: 2.8 u[IU]/mL (ref 0.450–4.500)

## 2017-09-13 LAB — LIPID PANEL
Chol/HDL Ratio: 3.8 ratio (ref 0.0–4.4)
Cholesterol, Total: 243 mg/dL — ABNORMAL HIGH (ref 100–199)
HDL: 64 mg/dL (ref >39)
LDL Calculated: 109 mg/dL — ABNORMAL HIGH (ref 0–99)
Triglycerides: 352 mg/dL — ABNORMAL HIGH (ref 0–149)
VLDL Cholesterol Cal: 70 mg/dL — ABNORMAL HIGH (ref 5–40)

## 2017-09-13 LAB — AMYLASE: Amylase: 22 U/L — ABNORMAL LOW (ref 31–124)

## 2017-09-13 LAB — LIPASE: Lipase: 38 U/L (ref 14–72)

## 2017-09-17 ENCOUNTER — Other Ambulatory Visit: Payer: Self-pay | Admitting: Physician Assistant

## 2017-09-17 DIAGNOSIS — F329 Major depressive disorder, single episode, unspecified: Secondary | ICD-10-CM

## 2017-09-17 DIAGNOSIS — F32A Depression, unspecified: Secondary | ICD-10-CM

## 2017-09-18 ENCOUNTER — Other Ambulatory Visit: Payer: Self-pay | Admitting: Physician Assistant

## 2017-09-18 DIAGNOSIS — J449 Chronic obstructive pulmonary disease, unspecified: Secondary | ICD-10-CM

## 2017-10-12 ENCOUNTER — Other Ambulatory Visit: Payer: Self-pay | Admitting: Physician Assistant

## 2017-10-12 DIAGNOSIS — R059 Cough, unspecified: Secondary | ICD-10-CM

## 2017-10-12 DIAGNOSIS — R05 Cough: Secondary | ICD-10-CM

## 2017-10-12 DIAGNOSIS — F411 Generalized anxiety disorder: Secondary | ICD-10-CM

## 2017-10-15 NOTE — Telephone Encounter (Signed)
alprazolam refill Last Refill:07/19/17 # 30 No RF Last OV: 07/19/17 PCP: Loree Fee McVey PA Pharmacy:[facility] [address]  Benzonatate (Tessalon) refill Last Refill:07/19/17 # 40 No RF Last OV: 07/19/17 PCP: Loree Fee McVey PA [facility] [address]

## 2017-10-16 ENCOUNTER — Telehealth: Payer: Self-pay | Admitting: Physician Assistant

## 2017-10-16 ENCOUNTER — Other Ambulatory Visit: Payer: Self-pay | Admitting: Physician Assistant

## 2017-10-16 DIAGNOSIS — G2581 Restless legs syndrome: Secondary | ICD-10-CM

## 2017-10-16 NOTE — Telephone Encounter (Signed)
Alprazolam refill Last Refill:07/19/17 # 30 No RF Last OV: 07/19/17 PCP: McVey   Benzonatate (Tessalon) refill Last Refill:07/19/17 # 40 No RF Last OV: 07/19/17 TFT:DDUKG   Pharmacy: [id number] [place], [place] - [address] [phone] (Phone) [phone] (Fax)

## 2017-10-16 NOTE — Telephone Encounter (Signed)
Copied from [place] [id number]. Topic: Quick Communication - Rx Refill/Question >> Oct 16, 2017  8:44 AM Bea Graff, NT wrote: Medication: benzonatate (TESSALON) 100 MG capsule and ALPRAZolam (XANAX) 1 MG tablet   Has the patient contacted their pharmacy? Yes.   (Agent: If no, request that the patient contact the pharmacy for the refill.) (Agent: If yes, when and what did the pharmacy advise?) To call office  Preferred Pharmacy (with phone number or street name): [id number] [place], [place] - [address] [phone] (Phone) [phone] (Fax)      Agent: Please be advised that RX refills may take up to 3 business days. We ask that you follow-up with your pharmacy.

## 2017-10-17 NOTE — Telephone Encounter (Signed)
Rx request sent to provider

## 2017-10-21 ENCOUNTER — Encounter: Payer: Self-pay | Admitting: *Deleted

## 2017-10-22 ENCOUNTER — Encounter: Payer: 59 | Admitting: Physician Assistant

## 2017-11-01 ENCOUNTER — Encounter: Payer: 59 | Admitting: Physician Assistant

## 2017-11-05 ENCOUNTER — Other Ambulatory Visit: Payer: Self-pay | Admitting: Physician Assistant

## 2017-11-05 DIAGNOSIS — F32A Depression, unspecified: Secondary | ICD-10-CM

## 2017-11-05 DIAGNOSIS — F329 Major depressive disorder, single episode, unspecified: Secondary | ICD-10-CM

## 2017-11-07 NOTE — Telephone Encounter (Signed)
citalopram refill Last Refill:07/19/17 # #90 4 RF Last OV: 07/19/17 PCP: Loree Fee McVey PA [facility] [id number]

## 2017-11-30 ENCOUNTER — Other Ambulatory Visit: Payer: Self-pay | Admitting: Physician Assistant

## 2017-11-30 DIAGNOSIS — J449 Chronic obstructive pulmonary disease, unspecified: Secondary | ICD-10-CM

## 2018-01-01 ENCOUNTER — Other Ambulatory Visit: Payer: Self-pay

## 2018-01-01 ENCOUNTER — Ambulatory Visit (INDEPENDENT_AMBULATORY_CARE_PROVIDER_SITE_OTHER): Payer: 59 | Admitting: Physician Assistant

## 2018-01-01 ENCOUNTER — Encounter: Payer: Self-pay | Admitting: Physician Assistant

## 2018-01-01 VITALS — BP 136/96 | HR 101 | Temp 97.9°F | Resp 18 | Ht 64.41 in | Wt 159.8 lb

## 2018-01-01 DIAGNOSIS — J441 Chronic obstructive pulmonary disease with (acute) exacerbation: Secondary | ICD-10-CM

## 2018-01-01 DIAGNOSIS — G2581 Restless legs syndrome: Secondary | ICD-10-CM

## 2018-01-01 DIAGNOSIS — R112 Nausea with vomiting, unspecified: Secondary | ICD-10-CM

## 2018-01-01 DIAGNOSIS — R062 Wheezing: Secondary | ICD-10-CM | POA: Diagnosis not present

## 2018-01-01 LAB — POCT CBC
Granulocyte percent: 73.3 %G (ref 37–80)
HCT, POC: 41.4 % (ref 37.7–47.9)
Hemoglobin: 13.7 g/dL (ref 12.2–16.2)
Lymph, poc: 1.7 (ref 0.6–3.4)
MCH, POC: 31.9 pg — AB (ref 27–31.2)
MCHC: 33.1 g/dL (ref 31.8–35.4)
MCV: 96.5 fL (ref 80–97)
MID (cbc): 0.2 (ref 0–0.9)
MPV: 7.1 fL (ref 0–99.8)
POC Granulocyte: 5.3 (ref 2–6.9)
POC LYMPH PERCENT: 23.3 % (ref 10–50)
POC MID %: 3.4 %M (ref 0–12)
Platelet Count, POC: 250 10*3/uL (ref 142–424)
RBC: 4.29 M/uL (ref 4.04–5.48)
RDW, POC: 14.7 %
WBC: 7.2 10*3/uL (ref 4.6–10.2)

## 2018-01-01 MED ORDER — IPRATROPIUM BROMIDE 0.02 % IN SOLN
0.5000 mg | Freq: Once | RESPIRATORY_TRACT | Status: AC
  Administered 2018-01-01: 0.5 mg via RESPIRATORY_TRACT

## 2018-01-01 MED ORDER — PROMETHAZINE HCL 25 MG/ML IJ SOLN
50.0000 mg | Freq: Once | INTRAMUSCULAR | Status: AC
  Administered 2018-01-01: 50 mg via INTRAMUSCULAR

## 2018-01-01 MED ORDER — KETOROLAC TROMETHAMINE 60 MG/2ML IM SOLN
60.0000 mg | Freq: Once | INTRAMUSCULAR | Status: AC
  Administered 2018-01-01: 60 mg via INTRAMUSCULAR

## 2018-01-01 MED ORDER — TIOTROPIUM BROMIDE MONOHYDRATE 18 MCG IN CAPS: 18.0000 ug | ORAL_CAPSULE | Freq: Every day | RESPIRATORY_TRACT | 12 refills | Status: DC

## 2018-01-01 MED ORDER — ROPINIROLE HCL 1 MG PO TABS: 1.0000 mg | ORAL_TABLET | Freq: Every day | ORAL | 2 refills | Status: DC

## 2018-01-01 MED ORDER — PREDNISONE 20 MG PO TABS: 40.0000 mg | ORAL_TABLET | Freq: Every day | ORAL | 0 refills | Status: AC

## 2018-01-01 MED ORDER — ALBUTEROL SULFATE (2.5 MG/3ML) 0.083% IN NEBU
2.5000 mg | INHALATION_SOLUTION | Freq: Once | RESPIRATORY_TRACT | Status: AC
  Administered 2018-01-01: 2.5 mg via RESPIRATORY_TRACT

## 2018-01-01 NOTE — Patient Instructions (Addendum)
Prednisone 63m with breakfast for the next 5 days.   Your albuterol inhaler is an emergency only medicine. The more you use it, the less effective it will be.   Continue using your Advair twice daily.  Add Spiriva once daily.   Continue to work hard on cutting back your smoking.   Come back and see me in 3-4 weeks to recheck your wheezing.   Thank you for coming in today. I hope you feel we met your needs.  Feel free to call PCP if you have any questions or further requests.  Please consider signing up for MyChart if you do not already have it, as this is a great way to communicate with me.  Best,  Whitney McVey, PA-C   IF you received an x-ray today, you will receive an invoice from [facility]. Please contact [facility] at [phone] questions or concerns regarding your invoice.   IF you received labwork today, you will receive an invoice from [place] Please contact [facility] at [phone] questions or concerns regarding your invoice.   Our billing staff will not be able to assist you with questions regarding bills from these companies.  You will be contacted with the lab results as soon as they are available. The fastest way to get your results is to activate your My Chart account. Instructions are located on the last page of this paperwork. If you have not heard from [place] results in 2 weeks, please contact this office.

## 2018-01-01 NOTE — Progress Notes (Signed)
Vicki [name]  MRN: [id number] DOB: [birth year]  PCP: Dorise Hiss, PA-C  Subjective:  Pt is a 52 year old female who presents to clinic for intractable nausea and vomiting x 2 days. She is here today with her husband. Her husband was sick recently with a sore throat and congestion. Pt started feeling ill about 3 days ago, yesterday started vomiting.  This morning she endorses a "massive headache".  Last night she ate dinner: salsbury steak, mac and cheese. She is able to drink fluids.   H/o COPD - albuterol and Advair 500-50 mcg/dose. "I don't think it's strong enough." she is using this twice daily. She is using albuterol once daily. shob walking to mailbox and getting dressed. Tessalon pearls - takes this daily.  She is still smoking. Chantix did not work. She did not take it as directed - did not take the starter pack, then stopped the medicine then started back up again.   Review of Systems  Constitutional: Positive for fatigue. Negative for chills, diaphoresis and fever.  HENT: Negative for congestion, postnasal drip, rhinorrhea, sinus pressure, sinus pain and sore throat.   Respiratory: Positive for cough, shortness of breath and wheezing.   Gastrointestinal: Positive for abdominal pain, nausea and vomiting.  Neurological: Positive for headaches.  Psychiatric/Behavioral: Negative for sleep disturbance.    Patient Active Problem List   Diagnosis Date Noted  . Polypharmacy 07/19/2017  . Asthma   . Carpal tunnel syndrome   . COPD (chronic obstructive pulmonary disease) ([place])   . DDD (degenerative disc disease), lumbar 10/22/2016    Current Outpatient Medications on File Prior to Visit  Medication Sig Dispense Refill  . ADVAIR DISKUS 500-50 MCG/DOSE AEPB TAKE 1 PUFF BY MOUTH TWICE A DAY 60 each 0  . albuterol (PROVENTIL HFA;VENTOLIN HFA) 108 (90 Base) MCG/ACT inhaler Inhale 2 puffs into the lungs every 4 (four) hours as needed for wheezing or shortness of  breath. 1 Inhaler 0  . ALPRAZolam (XANAX) 1 MG tablet TAKE 1/2 TO 1 TABLETS (0.5-1 MG TOTAL) BY MOUTH DAILY AS NEEDED FOR ANXIETY OR SLEEP. 30 tablet 0  . aspirin 81 MG tablet Take 81 mg by mouth daily.    Marland Kitchen atorvastatin (LIPITOR) 20 MG tablet Take 1 tablet (20 mg total) by mouth daily. 90 tablet 3  . benzonatate (TESSALON) 100 MG capsule TAKE 1 TO 2 CAPSULES (100-200 MG TOTAL) BY MOUTH 3 (THREE) TIMES DAILY AS NEEDED FOR COUGH. 40 capsule 0  . cetirizine (ZYRTEC) 10 MG tablet Take 1 tablet (10 mg total) by mouth daily. 30 tablet 11  . citalopram (CELEXA) 20 MG tablet TAKE 3 TABLETS BY MOUTH EVERY DAY 270 tablet 2  . cyclobenzaprine (FLEXERIL) 10 MG tablet Take 1 tablet (10 mg total) by mouth 3 (three) times daily as needed for muscle spasms. 30 tablet 0  . doxycycline (VIBRA-TABS) 100 MG tablet Take 1 tablet (100 mg total) by mouth 2 (two) times daily. 14 tablet 0  . eletriptan (RELPAX) 40 MG tablet Take 1 tablet (40 mg total) by mouth as needed for migraine or headache. One tablet by mouth at onset of headache. May repeat in 2 hours if headache persists or recurs. 10 tablet 0  . glycopyrrolate (ROBINUL) 1 MG tablet every evening.  5  . HYDROcodone-homatropine (HYCODAN) 5-1.5 MG/5ML syrup Take 5 mLs by mouth every 8 (eight) hours as needed for cough. 120 mL 0  . ibuprofen (ADVIL,MOTRIN) 800 MG tablet Take 800 mg by mouth every 8 (  eight) hours as needed for mild pain.    . meloxicam (MOBIC) 15 MG tablet Take 1 tablet (15 mg total) by mouth daily. 30 tablet 0  . rOPINIRole (REQUIP) 1 MG tablet TAKE 1 TABLET (1 MG TOTAL) BY MOUTH AT BEDTIME. 30 tablet 2  . traZODone (DESYREL) 50 MG tablet Take 1 tablet (50 mg total) by mouth at bedtime. 30 tablet 0  . traZODone (DESYREL) 50 MG tablet Take 0.5-1 tablets (25-50 mg total) by mouth at bedtime as needed for sleep. 30 tablet 0  . traZODone (DESYREL) 50 MG tablet Take 0.5-1 tablets (25-50 mg total) by mouth at bedtime as needed for sleep. 30 tablet 0  .  varenicline (CHANTIX STARTING MONTH PAK) 0.5 MG X 11 & 1 MG X 42 tablet Take one 0.5 mg tablet by mouth once daily for 3 days, then increase to one 0.5 mg tablet twice daily for 4 days, then increase to one 1 mg tablet twice daily. 53 tablet 0   No current facility-administered medications on file prior to visit.     Allergies  Allergen Reactions  . Jasmine Oil Shortness Of Breath and Swelling  . Penicillins Anaphylaxis and Hives  . Aleve [Naproxen Sodium] Itching  . Benadryl [Diphenhydramine] Other (See Comments)    hallucinations  . Ceftin [Cefuroxime Axetil] Other (See Comments)    unsure  . Depo-Provera [Medroxyprogesterone Acetate] Other (See Comments)    Severe acne  . Oxycodone Nausea And Vomiting  . Tramadol Other (See Comments)    Kept her up all night     Objective:  BP (!) 136/96   Pulse (!) 101   Temp 97.9 F (36.6 C) (Oral)   Resp 18   Ht 5' 4.41" (1.636 m)   Wt 159 lb 12.8 oz (72.5 kg)   SpO2 92%   BMI 27.08 kg/m   Physical Exam  Constitutional: She is oriented to person, place, and time. No distress.  Cardiovascular: Normal rate, regular rhythm and normal heart sounds.  Pulmonary/Chest: Effort normal. She has wheezes in the right upper field, the right middle field, the right lower field, the left upper field, the left middle field and the left lower field.  Audible wheezing with expiration with conversation.   Neurological: She is alert and oriented to person, place, and time.  Skin: Skin is warm and dry.  Psychiatric: Judgment normal.  Vitals reviewed.  Results for orders placed or performed in visit on 01/01/18  POCT CBC  Result Value Ref Range   WBC 7.2 4.6 - 10.2 K/uL   Lymph, poc 1.7 0.6 - 3.4   POC LYMPH PERCENT 23.3 10 - 50 %L   MID (cbc) 0.2 0 - 0.9   POC MID % 3.4 0 - 12 %M   POC Granulocyte 5.3 2 - 6.9   Granulocyte percent 73.3 37 - 80 %G   RBC 4.29 4.04 - 5.48 M/uL   Hemoglobin 13.7 12.2 - 16.2 g/dL   HCT, POC 41.4 37.7 - 47.9 %    MCV 96.5 80 - 97 fL   MCH, POC 31.9 (A) 27 - 31.2 pg   MCHC 33.1 31.8 - 35.4 g/dL   RDW, POC 14.7 %   Platelet Count, POC 250 142 - 424 K/uL   MPV 7.1 0 - 99.8 fL   Assessment and Plan :  1. COPD exacerbation ([place]) 2. Wheezing - Wheezing greatly improved following breathing treatment. Daily wheezing and shob not improved after increasing Advair to 500-50 at her last  OV. Plan to add Spiriva. RTC in 3 weeks to recheck symptoms. X-ray not done today as there is no x-ray coverage. Plan to get chest x-ray at her next OV. Consider pulm referral.  - tiotropium (SPIRIVA HANDIHALER) 18 MCG inhalation capsule; Place 1 capsule (18 mcg total) into inhaler and inhale daily.  Dispense: 30 capsule; Refill: 12 - Check Peak Flow - albuterol (PROVENTIL) (2.5 MG/3ML) 0.083% nebulizer solution 2.5 mg - ipratropium (ATROVENT) nebulizer solution 0.5 mg - predniSONE (DELTASONE) 20 MG tablet; Take 2 tablets (40 mg total) by mouth daily with breakfast for 5 days.  Dispense: 10 tablet; Refill: 0  3. Intractable vomiting with nausea, unspecified vomiting type - pt endorses 90% improvement in symptoms following IVF, phenergan and toradol. Advised hydration and bland diet.  - ketorolac (TORADOL) injection 60 mg - PR NORMAL SALINE SOLUTION INFUS - PR CATH IMPL VASC ACCESS PORTAL - promethazine (PHENERGAN) injection 50 mg - POCT CBC - Insert peripheral IV  4. RLS (restless legs syndrome) - rOPINIRole (REQUIP) 1 MG tablet; Take 1 tablet (1 mg total) by mouth at bedtime.  Dispense: 30 tablet; Refill: 2\   Mercer Pod, PA-C  [facility] 01/01/2018 8:28 AM  Please note: Portions of this report may have been transcribed using dragon voice recognition software. Every effort was made to ensure accuracy; however, inadvertent computerized transcription errors may be present.

## 2018-01-03 ENCOUNTER — Telehealth: Payer: Self-pay | Admitting: Physician Assistant

## 2018-01-03 NOTE — Telephone Encounter (Signed)
Please see note below. 

## 2018-01-03 NOTE — Telephone Encounter (Signed)
Copied from [place] [id number]. Topic: General - Other >> Jan 03, 2018  9:44 AM [name] wrote: Reason for CRM: pt calling wanting to know if medicine could be called into her pharmacy for a sinus infection she is coughing with green mucus coming out of nose [id number] [place], [place] - [address] [phone] (Phone) [phone] (Fax)

## 2018-01-05 ENCOUNTER — Other Ambulatory Visit: Payer: Self-pay | Admitting: Physician Assistant

## 2018-01-05 DIAGNOSIS — J449 Chronic obstructive pulmonary disease, unspecified: Secondary | ICD-10-CM

## 2018-01-06 ENCOUNTER — Other Ambulatory Visit: Payer: Self-pay | Admitting: Physician Assistant

## 2018-01-06 DIAGNOSIS — J329 Chronic sinusitis, unspecified: Secondary | ICD-10-CM

## 2018-01-06 MED ORDER — DOXYCYCLINE HYCLATE 100 MG PO TABS: 100.0000 mg | ORAL_TABLET | Freq: Two times a day (BID) | ORAL | 0 refills | Status: DC

## 2018-01-06 NOTE — Telephone Encounter (Signed)
Prescription sent to pharmacy. Please call pt and let her know.

## 2018-01-07 ENCOUNTER — Other Ambulatory Visit: Payer: Self-pay | Admitting: Physician Assistant

## 2018-01-07 NOTE — Telephone Encounter (Signed)
Relpax 40 mg and Xanax 1 mg refill Last Refill:Relpax # 10  07/19/17 Last Refill:  Xanax  10/21/17  #30 Last OV: 01/01/18  PCP: [id number]  Returned due to controlled substance

## 2018-01-07 NOTE — Telephone Encounter (Signed)
Copied from [place] [id number]. Topic: Quick Communication - Rx Refill/Question >> Jan 07, 2018  2:12 PM Sheran Luz wrote: Medication: eletriptan (RELPAX) 40 MG tablet [[id number]] and ALPRAZolam Duanne Moron) 1 MG tablet [[id number]]     Preferred Pharmacy (with phone number or street name): [id number] , [place] - [address] [phone] (Phone) [phone] (Fax)    Agent: Please be advised that RX refills may take up to 3 business days. We ask that you follow-up with your pharmacy.

## 2018-01-07 NOTE — Telephone Encounter (Signed)
Please see note below. 

## 2018-01-08 ENCOUNTER — Other Ambulatory Visit: Payer: Self-pay | Admitting: Physician Assistant

## 2018-01-08 DIAGNOSIS — R062 Wheezing: Secondary | ICD-10-CM

## 2018-01-08 MED ORDER — UMECLIDINIUM BROMIDE 62.5 MCG/INH IN AEPB: 1.0000 | INHALATION_SPRAY | Freq: Every day | RESPIRATORY_TRACT | 6 refills | Status: DC

## 2018-01-09 ENCOUNTER — Telehealth: Payer: Self-pay | Admitting: Physician Assistant

## 2018-01-09 NOTE — Telephone Encounter (Signed)
Pt is calling requesting a update on refill of ALPRAZolam (XANAX) 1 MG tablet  Cb# [id number]

## 2018-01-09 NOTE — Telephone Encounter (Signed)
Copied from [place] [id number]. Topic: General - Other >> Jan 09, 2018  2:09 PM Vicki [name] wrote: Pt states she was prescribed doxycycline (VIBRA-TABS) 100 MG tablet again and she is unable to take due to running her blood pressure up. She is requesting med to be removed from chart and not given to her again.

## 2018-01-14 NOTE — Telephone Encounter (Signed)
Put on allergy list

## 2018-01-15 ENCOUNTER — Other Ambulatory Visit: Payer: Self-pay | Admitting: Physician Assistant

## 2018-01-15 DIAGNOSIS — G43909 Migraine, unspecified, not intractable, without status migrainosus: Secondary | ICD-10-CM

## 2018-01-15 DIAGNOSIS — F411 Generalized anxiety disorder: Secondary | ICD-10-CM

## 2018-01-15 MED ORDER — ELETRIPTAN HYDROBROMIDE 40 MG PO TABS: 40.0000 mg | ORAL_TABLET | ORAL | 0 refills | Status: DC | PRN

## 2018-01-15 MED ORDER — ALPRAZOLAM 1 MG PO TABS: ORAL_TABLET | ORAL | 0 refills | Status: DC

## 2018-01-17 ENCOUNTER — Telehealth: Payer: Self-pay | Admitting: Physician Assistant

## 2018-01-17 ENCOUNTER — Ambulatory Visit (INDEPENDENT_AMBULATORY_CARE_PROVIDER_SITE_OTHER): Payer: 59 | Admitting: Physician Assistant

## 2018-01-17 ENCOUNTER — Other Ambulatory Visit: Payer: Self-pay | Admitting: Physician Assistant

## 2018-01-17 ENCOUNTER — Other Ambulatory Visit: Payer: Self-pay

## 2018-01-17 ENCOUNTER — Encounter: Payer: Self-pay | Admitting: Physician Assistant

## 2018-01-17 VITALS — BP 142/100 | HR 98 | Temp 98.9°F | Resp 16 | Ht 63.5 in | Wt 163.0 lb

## 2018-01-17 DIAGNOSIS — Z130 Encounter for screening for diseases of the blood and blood-forming organs and certain disorders involving the immune mechanism: Secondary | ICD-10-CM

## 2018-01-17 DIAGNOSIS — R21 Rash and other nonspecific skin eruption: Secondary | ICD-10-CM | POA: Diagnosis not present

## 2018-01-17 DIAGNOSIS — Z1329 Encounter for screening for other suspected endocrine disorder: Secondary | ICD-10-CM | POA: Diagnosis not present

## 2018-01-17 DIAGNOSIS — R61 Generalized hyperhidrosis: Secondary | ICD-10-CM

## 2018-01-17 DIAGNOSIS — Z13228 Encounter for screening for other metabolic disorders: Secondary | ICD-10-CM

## 2018-01-17 DIAGNOSIS — Z1321 Encounter for screening for nutritional disorder: Secondary | ICD-10-CM

## 2018-01-17 DIAGNOSIS — G8929 Other chronic pain: Secondary | ICD-10-CM

## 2018-01-17 DIAGNOSIS — M5441 Lumbago with sciatica, right side: Secondary | ICD-10-CM

## 2018-01-17 DIAGNOSIS — Z0000 Encounter for general adult medical examination without abnormal findings: Secondary | ICD-10-CM | POA: Diagnosis not present

## 2018-01-17 DIAGNOSIS — Z1231 Encounter for screening mammogram for malignant neoplasm of breast: Secondary | ICD-10-CM

## 2018-01-17 DIAGNOSIS — Z1211 Encounter for screening for malignant neoplasm of colon: Secondary | ICD-10-CM

## 2018-01-17 LAB — POCT URINALYSIS DIP (MANUAL ENTRY)
Bilirubin, UA: NEGATIVE
Blood, UA: NEGATIVE
Glucose, UA: NEGATIVE mg/dL
Ketones, POC UA: NEGATIVE mg/dL
Leukocytes, UA: NEGATIVE
Nitrite, UA: NEGATIVE
Protein Ur, POC: NEGATIVE mg/dL
Spec Grav, UA: 1.015 (ref 1.010–1.025)
Urobilinogen, UA: 0.2 U/dL
pH, UA: 7 (ref 5.0–8.0)

## 2018-01-17 LAB — POCT SKIN KOH: Skin KOH, POC: NEGATIVE

## 2018-01-17 MED ORDER — MELOXICAM 15 MG PO TABS: 15.0000 mg | ORAL_TABLET | Freq: Every day | ORAL | 1 refills | Status: DC

## 2018-01-17 MED ORDER — GLYCOPYRROLATE 1 MG PO TABS: 1.0000 mg | ORAL_TABLET | Freq: Every evening | ORAL | 5 refills | Status: DC

## 2018-01-17 MED ORDER — CYCLOBENZAPRINE HCL 10 MG PO TABS: 10.0000 mg | ORAL_TABLET | Freq: Three times a day (TID) | ORAL | 1 refills | Status: DC | PRN

## 2018-01-17 NOTE — Telephone Encounter (Signed)
Copied from [place]. Topic: Quick Communication - Rx Refill/Question >> Jan 17, 2018  5:18 PM Margot Ables wrote: Medication: relpax - [facility]/electronic/fax/call Has the patient contacted their pharmacy? yes Preferred Pharmacy (with phone number [facility] [address]  Agent: Please be advised that RX refills may take up to 3 business days. We ask that you follow-up with your pharmacy.

## 2018-01-17 NOTE — Progress Notes (Signed)
[facility], [place] [address] [phone]  Date:  01/17/2018   Name:  Vicki [name]   DOB:  1965-09-29   MRN:  [id number]  PCP:  Dorise Hiss, PA-C    Chief Complaint: Annual Exam (no pap, (health maint discussed w /pt). doesn't want colonoscopy. pt will sche a mammogram and have results sent here. (Form to fill out).)   History of Present Illness:  This is a 52 y.o. female who  has a past medical history of Asthma, Carpal tunnel syndrome, COPD (chronic obstructive pulmonary disease) ([place]), and Diverticulitis., HTN, HLD who is presenting for CPE.  HLD - atorvastatin 35m Lab Results  Component Value Date   CHOL 243 (H) 09/12/2017   CHOL 326 (H) 09/04/2017   CHOL 304 (H) 07/19/2017   Lab Results  Component Value Date   HDL 64 09/12/2017   HDL 55 09/04/2017   HDL 69 07/19/2017   Lab Results  Component Value Date   LDLCALC 109 (H) 09/12/2017   LDLCALC Comment 09/04/2017   LDLCALC Comment 07/19/2017   Lab Results  Component Value Date   TRIG 352 (H) 09/12/2017   TRIG 1,275 (HH) 09/04/2017   TRIG 556 (HH) 07/19/2017   Lab Results  Component Value Date   CHOLHDL 3.8 09/12/2017   CHOLHDL 5.9 (H) 09/04/2017   CHOLHDL 4.4 07/19/2017   No results found for: LDLDIRECT  COPD - Recently added Spiriva qd 8/21. advair 500-50 bid. Albuterol 2.569m3mL. She has not been using Albuterol at all. Feeling a lot of improvement "I don't get out of breath when showering anymore"  Complaints: smell of mold in her bathroom, possibly causing breathing problems and nasal congestion.   Last pap: h/o hysterectomy  Sexual history:  Immunizations: utd Dentist: q 6 months Eye: 20/20. No contacts.  Diet/Exercise: backing off red meat. Fresh fruit cups, veggies.  Walks about 1/4 mile/day walking her dog.   Tobacco/alcohol/substance use: Current Every Day Smoker. Smokes 1/2 ppd x 30 years. Plans to start Chantix even though it makes her  moody  Mammogram: 2017. Plans to make appt today. Sister had breast cancer.  Colonoscopy: never   Review of Systems:  Review of Systems  Constitutional: Negative for chills, diaphoresis, fatigue and fever.  HENT: Positive for congestion. Negative for postnasal drip, rhinorrhea, sinus pressure, sinus pain, sneezing and sore throat.   Respiratory: Positive for wheezing. Negative for cough, chest tightness and shortness of breath.   Cardiovascular: Negative for chest pain and palpitations.  Gastrointestinal: Negative for abdominal pain, diarrhea, nausea and vomiting.  Genitourinary: Negative for decreased urine volume, difficulty urinating, dysuria, enuresis, flank pain, frequency, hematuria and urgency.  Musculoskeletal: Negative for back pain.  Neurological: Negative for dizziness, weakness, light-headedness and headaches.  Psychiatric/Behavioral: Negative for sleep disturbance.    [name] Active Problem List   Diagnosis Date Noted  . Polypharmacy 07/19/2017  . Asthma   . Carpal tunnel syndrome   . [place] disease) [place]  . DDD (degenerative disc disease), lumbar 10/22/2016    Prior to Admission medications   Medication Sig Start Date End Date Taking? Authorizing Provider  ADVAIR DISKUS 500-50 MCG/DOSE AEPB INHALE 1 PUFF INTO THE LUNGS TWICE A DAY 01/06/18  Yes Muzammil Bruins, ElGelene MinkPA-C  albuterol (PROVENTIL HFA;VENTOLIN HFA) 108 (90 Base) MCG/ACT inhaler Inhale 2 puffs into the lungs every 4 (four) hours as needed for wheezing or shortness of breath. 07/19/17  Yes Daleyssa Loiselle, ElGelene MinkPA-C  3 (three) times daily as needed for muscle spasms.  Dispense: 30 tablet; Refill: 1 - meloxicam (MOBIC) 15 MG tablet; Take 1 tablet (15 mg total) by mouth daily.  Dispense: 30 tablet; Refill: 1  7. Night sweats - glycopyrrolate (ROBINUL) 1 MG tablet; Take 1 tablet (1 mg total) by mouth every evening.  Dispense: 30 tablet; Refill: 5   Whitney Berlin Viereck, PA-C  [facility] 01/17/2018 9:36 AM

## 2018-01-17 NOTE — Patient Instructions (Addendum)
It was great seeing you today.  Continue trying to cut back on smoking. Keep it up!   I will contact you with your lab results.   Health Maintenance, Female Adopting a healthy lifestyle and getting preventive care can go a long way to promote health and wellness. Talk with your health care provider about what schedule of regular examinations is right for you. This is a good chance for you to check in with your provider about disease prevention and staying healthy. In between checkups, there are plenty of things you can do on your own. Experts have done a lot of research about which lifestyle changes and preventive measures are most likely to keep you healthy. Ask your health care provider for more information. Weight and diet Eat a healthy diet  Be sure to include plenty of vegetables, fruits, low-fat dairy products, and lean protein.  Do not eat a lot of foods high in solid fats, added sugars, or salt.  Get regular exercise. This is one of the most important things you can do for your health. ? Most adults should exercise for at least 150 minutes each week. The exercise should increase your heart rate and make you sweat (moderate-intensity exercise). ? Most adults should also do strengthening exercises at least twice a week. This is in addition to the moderate-intensity exercise.  Maintain a healthy weight  Body mass index (BMI) is a measurement that can be used to identify possible weight problems. It estimates body fat based on height and weight. Your health care provider can help determine your BMI and help you achieve or maintain a healthy weight.  For females 54 years of age and older: ? A BMI below 18.5 is considered underweight. ? A BMI of 18.5 to 24.9 is normal. ? A BMI of 25 to 29.9 is considered overweight. ? A BMI of 30 and above is considered obese.  Watch levels of cholesterol and blood lipids  You should start having your blood tested for lipids and cholesterol at 50  years of age, then have this test every 5 years.  You may need to have your cholesterol levels checked more often if: ? Your lipid or cholesterol levels are high. ? You are older than 50 years of age. ? You are at high risk for heart disease.  Cancer screening Lung Cancer  Lung cancer screening is recommended for adults 56-52 years old who are at high risk for lung cancer because of a history of smoking.  A yearly low-dose CT scan of the lungs is recommended for people who: ? Currently smoke. ? Have quit within the past 15 years. ? Have at least a 30-pack-year history of smoking. A pack year is smoking an average of one pack of cigarettes a day for 1 year.  Yearly screening should continue until it has been 15 years since you quit.  Yearly screening should stop if you develop a health problem that would prevent you from having lung cancer treatment.  Breast Cancer  Practice breast self-awareness. This means understanding how your breasts normally appear and feel.  It also means doing regular breast self-exams. Let your health care provider know about any changes, no matter how small.  If you are in your 20s or 30s, you should have a clinical breast exam (CBE) by a health care provider every 1-3 years as part of a regular health exam.  If you are 87 or older, have a CBE every year. Also consider having a breast X-ray (  mammogram) every year.  If you have a family history of breast cancer, talk to your health care provider about genetic screening.  If you are at high risk for breast cancer, talk to your health care provider about having an MRI and a mammogram every year.  Breast cancer gene (BRCA) assessment is recommended for women who have family members with BRCA-related cancers. BRCA-related cancers include: ? Breast. ? Ovarian. ? Tubal. ? Peritoneal cancers.  Results of the assessment will determine the need for genetic counseling and BRCA1 and BRCA2 testing.  Cervical  Cancer Your health care provider may recommend that you be screened regularly for cancer of the pelvic organs (ovaries, uterus, and vagina). This screening involves a pelvic examination, including checking for microscopic changes to the surface of your cervix (Pap test). You may be encouraged to have this screening done every 3 years, beginning at age 86.  For women ages 45-65, health care providers may recommend pelvic exams and Pap testing every 3 years, or they may recommend the Pap and pelvic exam, combined with testing for human papilloma virus (HPV), every 5 years. Some types of HPV increase your risk of cervical cancer. Testing for HPV may also be done on women of any age with unclear Pap test results.  Other health care providers may not recommend any screening for nonpregnant women who are considered low risk for pelvic cancer and who do not have symptoms. Ask your health care provider if a screening pelvic exam is right for you.  If you have had past treatment for cervical cancer or a condition that could lead to cancer, you need Pap tests and screening for cancer for at least 20 years after your treatment. If Pap tests have been discontinued, your risk factors (such as having a new sexual partner) need to be reassessed to determine if screening should resume. Some women have medical problems that increase the chance of getting cervical cancer. In these cases, your health care provider may recommend more frequent screening and Pap tests.  Colorectal Cancer  This type of cancer can be detected and often prevented.  Routine colorectal cancer screening usually begins at 50 years of age and continues through 50 years of age.  Your health care provider may recommend screening at an earlier age if you have risk factors for colon cancer.  Your health care provider may also recommend using home test kits to check for hidden blood in the stool.  A small camera at the end of a tube can be used to  examine your colon directly (sigmoidoscopy or colonoscopy). This is done to check for the earliest forms of colorectal cancer.  Routine screening usually begins at age 29.  Direct examination of the colon should be repeated every 5-10 years through 50 years of age. However, you may need to be screened more often if early forms of precancerous polyps or small growths are found.  Skin Cancer  Check your skin from head to toe regularly.  Tell your health care provider about any new moles or changes in moles, especially if there is a change in a mole's shape or color.  Also tell your health care provider if you have a mole that is larger than the size of a pencil eraser.  Always use sunscreen. Apply sunscreen liberally and repeatedly throughout the day.  Protect yourself by wearing long sleeves, pants, a wide-brimmed hat, and sunglasses whenever you are outside.  Heart disease, diabetes, and high blood pressure  High blood pressure  causes heart disease and increases the risk of stroke. High blood pressure is more likely to develop in: ? People who have blood pressure in the high end of the normal range (130-139/85-89 mm Hg). ? People who are overweight or obese. ? People who are African American.  If you are 79-44 years of age, have your blood pressure checked every 3-5 years. If you are 46 years of age or older, have your blood pressure checked every year. You should have your blood pressure measured twice-once when you are at a hospital or clinic, and once when you are not at a hospital or clinic. Record the average of the two measurements. To check your blood pressure when you are not at a hospital or clinic, you can use: ? An automated blood pressure machine at a pharmacy. ? A home blood pressure monitor.  If you are between 94 years and 72 years old, ask your health care provider if you should take aspirin to prevent strokes.  Have regular diabetes screenings. This involves taking a  blood sample to check your fasting blood sugar level. ? If you are at a normal weight and have a low risk for diabetes, have this test once every three years after 50 years of age. ? If you are overweight and have a high risk for diabetes, consider being tested at a younger age or more often. Preventing infection Hepatitis B  If you have a higher risk for hepatitis B, you should be screened for this virus. You are considered at high risk for hepatitis B if: ? You were born in a country where hepatitis B is common. Ask your health care provider which countries are considered high risk. ? Your parents were born in a high-risk country, and you have not been immunized against hepatitis B (hepatitis B vaccine). ? You have HIV or AIDS. ? You use needles to inject street drugs. ? You live with someone who has hepatitis B. ? You have had sex with someone who has hepatitis B. ? You get hemodialysis treatment. ? You take certain medicines for conditions, including cancer, organ transplantation, and autoimmune conditions.  Hepatitis C  Blood testing is recommended for: ? Everyone born from [birth year] through 1963. ? Anyone with known risk factors for hepatitis C.  Sexually transmitted infections (STIs)  You should be screened for sexually transmitted infections (STIs) including gonorrhea and chlamydia if: ? You are sexually active and are younger than 50 years of age. ? You are older than 50 years of age and your health care provider tells you that you are at risk for this type of infection. ? Your sexual activity has changed since you were last screened and you are at an increased risk for chlamydia or gonorrhea. Ask your health care provider if you are at risk.  If you do not have HIV, but are at risk, it may be recommended that you take a prescription medicine daily to prevent HIV infection. This is called pre-exposure prophylaxis (PrEP). You are considered at risk if: ? You are sexually active and  do not regularly use condoms or know the HIV status of your partner(s). ? You take drugs by injection. ? You are sexually active with a partner who has HIV.  Talk with your health care provider about whether you are at high risk of being infected with HIV. If you choose to begin PrEP, you should first be tested for HIV. You should then be tested every 3 months for as long  as you are taking PrEP. Pregnancy  If you are premenopausal and you may become pregnant, ask your health care provider about preconception counseling.  If you may become pregnant, take 400 to 800 micrograms (mcg) of folic acid every day.  If you want to prevent pregnancy, talk to your health care provider about birth control (contraception). Osteoporosis and menopause  Osteoporosis is a disease in which the bones lose minerals and strength with aging. This can result in serious bone fractures. Your risk for osteoporosis can be identified using a bone density scan.  If you are 19 years of age or older, or if you are at risk for osteoporosis and fractures, ask your health care provider if you should be screened.  Ask your health care provider whether you should take a calcium or vitamin D supplement to lower your risk for osteoporosis.  Menopause may have certain physical symptoms and risks.  Hormone replacement therapy may reduce some of these symptoms and risks. Talk to your health care provider about whether hormone replacement therapy is right for you. Follow these instructions at home:  Schedule regular health, dental, and eye exams.  Stay current with your immunizations.  Do not use any tobacco products including cigarettes, chewing tobacco, or electronic cigarettes.  If you are pregnant, do not drink alcohol.  If you are breastfeeding, limit how much and how often you drink alcohol.  Limit alcohol intake to no more than 1 drink per day for nonpregnant women. One drink equals 12 ounces of beer, 5 ounces of  wine, or 1 ounces of hard liquor.  Do not use street drugs.  Do not share needles.  Ask your health care provider for help if you need support or information about quitting drugs.  Tell your health care provider if you often feel depressed.  Tell your health care provider if you have ever been abused or do not feel safe at home. This information is not intended to replace advice given to you by your health care provider. Make sure you discuss any questions you have with your health care provider. Document Released: 11/13/2010 Document Revised: 10/06/2015 Document Reviewed: 02/01/2015 Elsevier Interactive Patient Education  [employer].  Thank you for coming in today. I hope you feel we met your needs.  Feel free to call PCP if you have any questions or further requests.  Please consider signing up for MyChart if you do not already have it, as this is a great way to communicate with me.  Best,  Whitney McVey, PA-C  IF you received an x-ray today, you will receive an invoice from [facility]. Please contact [facility] at [phone] with questions or concerns regarding your invoice.   IF you received labwork today, you will receive an invoice from [facility]. Please contact [facility] at [phone] with questions or concerns regarding your invoice.   Our billing staff will not be able to assist you with questions regarding bills from these companies.  You will be contacted with the lab results as soon as they are available. The fastest way to get your results is to activate your My Chart account. Instructions are located on the last page of this paperwork. If you have not heard from [place] regarding the results in 2 weeks, please contact this office.

## 2018-01-18 LAB — CMP14+EGFR
ALT: 26 IU/L (ref 0–32)
AST: 44 IU/L — ABNORMAL HIGH (ref 0–40)
Albumin/Globulin Ratio: 1.8 (ref 1.2–2.2)
Albumin: 4.1 g/dL (ref 3.5–5.5)
Alkaline Phosphatase: 129 IU/L — ABNORMAL HIGH (ref 39–117)
BUN/Creatinine Ratio: 13 (ref 9–23)
BUN: 8 mg/dL (ref 6–24)
Bilirubin Total: 0.4 mg/dL (ref 0.0–1.2)
CO2: 27 mmol/L (ref 20–29)
Calcium: 9 mg/dL (ref 8.7–10.2)
Chloride: 100 mmol/L (ref 96–106)
Creatinine, Ser: 0.64 mg/dL (ref 0.57–1.00)
GFR calc Af Amer: 120 mL/min/{1.73_m2} (ref >59)
GFR calc non Af Amer: 104 mL/min/{1.73_m2} (ref >59)
Globulin, Total: 2.3 g/dL (ref 1.5–4.5)
Glucose: 100 mg/dL — ABNORMAL HIGH (ref 65–99)
Potassium: 4.1 mmol/L (ref 3.5–5.2)
Sodium: 143 mmol/L (ref 134–144)
Total Protein: 6.4 g/dL (ref 6.0–8.5)

## 2018-01-18 LAB — CBC WITH DIFFERENTIAL/PLATELET
Basophils Absolute: 0 10*3/uL (ref 0.0–0.2)
Basos: 0 %
EOS (ABSOLUTE): 0.1 10*3/uL (ref 0.0–0.4)
Eos: 1 %
Hematocrit: 39.8 % (ref 34.0–46.6)
Hemoglobin: 13.3 g/dL (ref 11.1–15.9)
Immature Grans (Abs): 0.1 10*3/uL (ref 0.0–0.1)
Immature Granulocytes: 1 %
Lymphocytes Absolute: 2 10*3/uL (ref 0.7–3.1)
Lymphs: 20 %
MCH: 32 pg (ref 26.6–33.0)
MCHC: 33.4 g/dL (ref 31.5–35.7)
MCV: 96 fL (ref 79–97)
Monocytes Absolute: 1 10*3/uL — ABNORMAL HIGH (ref 0.1–0.9)
Monocytes: 10 %
Neutrophils Absolute: 7 10*3/uL (ref 1.4–7.0)
Neutrophils: 68 %
Platelets: 275 10*3/uL (ref 150–450)
RBC: 4.16 x10E6/uL (ref 3.77–5.28)
RDW: 13.5 % (ref 12.3–15.4)
WBC: 10.1 10*3/uL (ref 3.4–10.8)

## 2018-01-18 LAB — VITAMIN D 25 HYDROXY (VIT D DEFICIENCY, FRACTURES): Vit D, 25-Hydroxy: 26.3 ng/mL — ABNORMAL LOW (ref 30.0–100.0)

## 2018-01-18 LAB — LIPID PANEL
Chol/HDL Ratio: 3.5 ratio (ref 0.0–4.4)
Cholesterol, Total: 216 mg/dL — ABNORMAL HIGH (ref 100–199)
HDL: 61 mg/dL (ref >39)
LDL Calculated: 76 mg/dL (ref 0–99)
Triglycerides: 394 mg/dL — ABNORMAL HIGH (ref 0–149)
VLDL Cholesterol Cal: 79 mg/dL — ABNORMAL HIGH (ref 5–40)

## 2018-01-18 LAB — HEMOGLOBIN A1C
Est. average glucose Bld gHb Est-mCnc: 111 mg/dL
Hgb A1c MFr Bld: 5.5 % (ref 4.8–5.6)

## 2018-01-20 NOTE — Telephone Encounter (Signed)
Rx was sent on 9/4 to pharmacy for pt to pick up.

## 2018-01-21 ENCOUNTER — Ambulatory Visit: Payer: BC Managed Care – PPO

## 2018-01-31 ENCOUNTER — Encounter: Payer: Self-pay | Admitting: Physician Assistant

## 2018-01-31 NOTE — Progress Notes (Signed)
Result note mailed. Start fish-oil supplement, flaxseed, red yeast rice, mediterranean diet. Recheck at neck annual

## 2018-02-05 ENCOUNTER — Other Ambulatory Visit: Payer: Self-pay | Admitting: Physician Assistant

## 2018-02-05 DIAGNOSIS — J449 Chronic obstructive pulmonary disease, unspecified: Secondary | ICD-10-CM

## 2018-02-13 ENCOUNTER — Ambulatory Visit: Payer: BC Managed Care – PPO

## 2018-02-18 ENCOUNTER — Ambulatory Visit: Payer: Self-pay

## 2018-03-20 ENCOUNTER — Other Ambulatory Visit: Payer: Self-pay | Admitting: Physician Assistant

## 2018-03-20 DIAGNOSIS — M5441 Lumbago with sciatica, right side: Principal | ICD-10-CM

## 2018-03-20 DIAGNOSIS — G8929 Other chronic pain: Secondary | ICD-10-CM

## 2018-03-20 DIAGNOSIS — F411 Generalized anxiety disorder: Secondary | ICD-10-CM

## 2018-03-20 NOTE — Telephone Encounter (Signed)
Requested medication (s) are due for refill today: yes  Requested medication (s) are on the active medication list: yes  Last refill:  01/15/18 #30  Future visit scheduled: No  Notes to clinic:  LOV on 01/17/18    Requested Prescriptions  Pending Prescriptions Disp Refills   ALPRAZolam (XANAX) 1 MG tablet 30 tablet 0    Sig: TAKE 1/2 TO 1 TABLETS (0.5-1 MG TOTAL) BY MOUTH DAILY AS NEEDED FOR ANXIETY OR SLEEP.     Not Delegated - Psychiatry:  Anxiolytics/Hypnotics Failed - 03/20/2018  2:26 PM      Failed - This refill cannot be delegated      Failed - Urine Drug Screen completed in last 360 days.      Passed - Valid encounter within last 6 months    Recent Outpatient Visits          2 months ago Annual physical exam   Primary Care at [facility], Gelene Mink, PA-C   2 months ago COPD exacerbation [facility])   Primary Care at [facility], Gelene Mink, PA-C   6 months ago Elevated blood pressure reading   Primary Care at [facility], [place], PA-C   6 months ago Sinusitis, unspecified chronicity, unspecified location   Primary Care at [facility], [place], PA-C   8 months ago Hyperlipidemia, unspecified hyperlipidemia type   Primary Care at [facility], [place], [place]

## 2018-03-20 NOTE — Telephone Encounter (Signed)
Copied from [place] [id number]. Topic: Quick Communication - Rx Refill/Question >> Mar 20, 2018  2:19 PM Burchel, Abbi R wrote: Medication: ALPRAZolam Duanne Moron) 1 MG tablet   Preferred Pharmacy: [id number] - [place], [place] - [address] [place] [address] Phone: [phone] Fax: [phone]  Please note pt is completely out of this medication    Pt was advised that RX refills may take up to 3 business days. We ask that you follow-up with your pharmacy.

## 2018-03-20 NOTE — Telephone Encounter (Signed)
Requested Prescriptions  Pending Prescriptions Disp Refills  . meloxicam (MOBIC) 15 MG tablet [Pharmacy Med Name: MELOXICAM 15 MG TABLET] 90 tablet 1    Sig: TAKE 1 TABLET BY MOUTH EVERY DAY     Analgesics:  COX2 Inhibitors Passed - 03/20/2018  5:01 AM      Passed - HGB in normal range and within 360 days    Hemoglobin  Date Value Ref Range Status  01/17/2018 13.3 11.1 - 15.9 g/dL Final         Passed - Cr in normal range and within 360 days    Creatinine, Ser  Date Value Ref Range Status  01/17/2018 0.64 0.57 - 1.00 mg/dL Final         Passed - Patient is not pregnant      Passed - Valid encounter within last 12 months    Recent Outpatient Visits          2 months ago Annual physical exam   Primary Care at [facility], Gelene Mink, PA-C   2 months ago COPD exacerbation [facility])   Primary Care at [facility], [place], PA-C   6 months ago Elevated blood pressure reading   Primary Care at [facility], [place], PA-C   6 months ago Sinusitis, unspecified chronicity, unspecified location   Primary Care at [facility], [place], PA-C   8 months ago Hyperlipidemia, unspecified hyperlipidemia type   Primary Care at [facility], [place], [place]

## 2018-03-21 ENCOUNTER — Other Ambulatory Visit: Payer: Self-pay | Admitting: Physician Assistant

## 2018-03-21 ENCOUNTER — Ambulatory Visit: Payer: Self-pay

## 2018-03-21 DIAGNOSIS — F411 Generalized anxiety disorder: Secondary | ICD-10-CM

## 2018-03-21 DIAGNOSIS — G4700 Insomnia, unspecified: Secondary | ICD-10-CM

## 2018-03-21 MED ORDER — TRAZODONE HCL 50 MG PO TABS: 50.0000 mg | ORAL_TABLET | Freq: Every day | ORAL | 0 refills | Status: DC

## 2018-03-21 MED ORDER — ALPRAZOLAM 1 MG PO TABS: ORAL_TABLET | ORAL | 0 refills | Status: DC

## 2018-03-21 NOTE — Telephone Encounter (Signed)
Discussed with Whitney McVey.  She is refilling meds.  States get pt in next week for appt with her.  Message sent to Scheduling.

## 2018-03-21 NOTE — Telephone Encounter (Signed)
Requested medication (s) are due for refill today: yes  Requested medication (s) are on the active medication list: yes  Last refill:  07/19/17  Future visit scheduled: no  Notes to clinic:  Med for sleep   Requested Prescriptions  Pending Prescriptions Disp Refills   traZODone (DESYREL) 50 MG tablet [Pharmacy Med Name: TRAZODONE 50 MG TABLET] 30 tablet 0    Sig: TAKE 1 TABLET BY MOUTH EVERYDAY AT BEDTIME     Psychiatry: Antidepressants - Serotonin Modulator Passed - 03/21/2018  2:42 AM      Passed - Valid encounter within last 6 months    Recent Outpatient Visits          2 months ago Annual physical exam   Primary Care at [facility] Gelene Mink, PA-C   2 months ago COPD exacerbation [facility])   Primary Care at [facility], [place], PA-C   6 months ago Elevated blood pressure reading   Primary Care at [facility], [place], PA-C   6 months ago Sinusitis, unspecified chronicity, unspecified location   Primary Care at [facility], [place], PA-C   8 months ago Hyperlipidemia, unspecified hyperlipidemia type   Primary Care at [facility], [place], [place]

## 2018-03-21 NOTE — Telephone Encounter (Signed)
Patient called in with c/o "anxiety." She says "I've been feeling this way for a month now and I haven't been on my anxiety medication, I need a refill on it. I called yesterday about it and it's not refilled yet. I am going through a lot with my family right now. I just don't feel like doing anything. All I do is sit around and think about what is going on. I don't even want to clean my house." I asked about thoughts of suicide, she denies. I asked about thoughts of hurting someone else, she denies. I asked about other symptoms, she says "before I was talking to you, I was feeling panic, my heart was racing and I was having more trouble breathing, but now I am calmer. I just need to come in to see Whitney so I can get my medicine refilled and talk to her about everything. She knows what is going on in my family." I advised no availability with Loree Fee today and the first availability is Tuesday, 03/25/18, but I am not allowed to schedule in those slots. I advised I will send over to the office to get approval from her to schedule in the slot for 1120 or 1140, which is what the patient requested, and someone from the office will call with the appointment confirmation, patient verbalized understanding. Care advice given, she verbalized understanding.  Reason for Disposition . [1] Symptoms of anxiety or panic attack AND [2] is a chronic symptom (recurrent or ongoing AND present > 4 weeks)  Answer Assessment - Initial Assessment Questions 1. CONCERN: "What happened that made you call today?"     Really stressed out; woke up and having an anxiety attack 2. ANXIETY SYMPTOM SCREENING: "Can you describe how you have been feeling?"  (e.g., tense, restless, panicky, anxious, keyed up, trouble sleeping, trouble concentrating)     All of the above symptoms 3. ONSET: "How long have you been feeling this way?"     About a month 4. RECURRENT: "Have you felt this way before?"  If yes: "What happened that time?" "What  helped these feelings go away in the past?"      Yes, being on depression medication 5. RISK OF HARM - SUICIDAL IDEATION:  "Do you ever have thoughts of hurting or killing yourself?"  (e.g., yes, no, no but preoccupation with thoughts about death)   - INTENT:  "Do you have thoughts of hurting or killing yourself right NOW?" (e.g., yes, no, N/A) No thoughts   - PLAN: "Do you have a specific plan for how you would do this?" (e.g., gun, knife, overdose, no plan, N/A)     N/A 6. RISK OF HARM - HOMICIDAL IDEATION:  "Do you ever have thoughts of hurting or killing someone else?"  (e.g., yes, no, no but preoccupation with thoughts about death)   - INTENT:  "Do you have thoughts of hurting or killing someone right NOW?" (e.g., yes, no, N/A) No thoughts   - PLAN: "Do you have a specific plan for how you would do this?" (e.g., gun, knife, no plan, N/A)      N/A 7. FUNCTIONAL IMPAIRMENT: "How have things been going for you overall in your life? Have you had any more difficulties than usual doing your normal daily activities?"  (e.g., better, same, worse; self-care, school, work, interactions)     Dealing with a lot in your family, stress; difficulty doing normal daily activities, don't want to clean or do anything 8. SUPPORT: "Who is with  you now?" "Who do you live with?" "Do you have family or friends nearby who you can talk to?"      Live with husband; yes family and friends to talk to 31. THERAPIST: "Do you have a counselor or therapist? Name?"     Not now 83. STRESSORS: "Has there been any new stress or recent changes in your life?"       Yes 11. CAFFEINE ABUSE: "Do you drink caffeinated beverages, and how much each day?" (e.g., coffee, tea, colas)       No 12. SUBSTANCE ABUSE: "Do you use any illegal drugs or alcohol?"       No 13. OTHER SYMPTOMS: "Do you have any other physical symptoms right now?" (e.g., chest pain, palpitations, difficulty breathing, fever)       Heart racing, but calmed now 14.  PREGNANCY: "Is there any chance you are pregnant?" "When was your last menstrual period?"       No  Protocols used: ANXIETY AND PANIC ATTACK-A-AH

## 2018-03-24 ENCOUNTER — Encounter: Payer: Self-pay | Admitting: Physician Assistant

## 2018-03-24 NOTE — Telephone Encounter (Signed)
Called pt and scheduled her for an OV - per McVey - 03/27/18 at 4:00 PM . I advised pt of time, building and late policy.

## 2018-03-25 ENCOUNTER — Ambulatory Visit
Admission: RE | Admit: 2018-03-25 | Discharge: 2018-03-25 | Disposition: A | Discharge status: Home / Self Care | Payer: 59 | Source: Ambulatory Visit | Attending: Physician Assistant | Admitting: Physician Assistant

## 2018-03-25 DIAGNOSIS — Z1231 Encounter for screening mammogram for malignant neoplasm of breast: Secondary | ICD-10-CM

## 2018-03-26 ENCOUNTER — Other Ambulatory Visit: Payer: Self-pay | Admitting: Physician Assistant

## 2018-03-26 DIAGNOSIS — G2581 Restless legs syndrome: Secondary | ICD-10-CM

## 2018-03-26 NOTE — Telephone Encounter (Signed)
Requested Prescriptions  Pending Prescriptions Disp Refills  . rOPINIRole (REQUIP) 1 MG tablet [Pharmacy Med Name: ROPINIROLE HCL 1 MG TABLET] 90 tablet 0    Sig: TAKE 1 TABLET BY MOUTH AT BEDTIME.     Neurology:  Parkinsonian Agents Failed - 03/26/2018  2:06 AM      Failed - Last BP in normal range    BP Readings from Last 1 Encounters:  01/17/18 (!) 142/100         Passed - Valid encounter within last 12 months    Recent Outpatient Visits          2 months ago Annual physical exam   Primary Care at [facility], Gelene Mink, PA-C   2 months ago COPD exacerbation [facility])   Primary Care at [facility], Gelene Mink, PA-C   6 months ago Elevated blood pressure reading   Primary Care at [facility], [place], PA-C   6 months ago Sinusitis, unspecified chronicity, unspecified location   Primary Care at [facility], Gelene Mink, PA-C   8 months ago Hyperlipidemia, unspecified hyperlipidemia type   Primary Care at [facility], Gelene Mink, [place], Gelene Mink, PA-C Primary Care at [place], [facility]

## 2018-03-27 ENCOUNTER — Other Ambulatory Visit: Payer: Self-pay

## 2018-03-27 ENCOUNTER — Encounter: Payer: Self-pay | Admitting: Physician Assistant

## 2018-03-27 ENCOUNTER — Ambulatory Visit: Payer: 59 | Admitting: Physician Assistant

## 2018-03-27 VITALS — BP 137/95 | HR 95 | Temp 98.4°F | Resp 16 | Ht 64.0 in | Wt 164.0 lb

## 2018-03-27 DIAGNOSIS — F411 Generalized anxiety disorder: Secondary | ICD-10-CM | POA: Diagnosis not present

## 2018-03-27 DIAGNOSIS — R61 Generalized hyperhidrosis: Secondary | ICD-10-CM | POA: Diagnosis not present

## 2018-03-27 DIAGNOSIS — F329 Major depressive disorder, single episode, unspecified: Secondary | ICD-10-CM

## 2018-03-27 DIAGNOSIS — J449 Chronic obstructive pulmonary disease, unspecified: Secondary | ICD-10-CM

## 2018-03-27 DIAGNOSIS — G2581 Restless legs syndrome: Secondary | ICD-10-CM

## 2018-03-27 DIAGNOSIS — F32A Depression, unspecified: Secondary | ICD-10-CM

## 2018-03-27 DIAGNOSIS — G43909 Migraine, unspecified, not intractable, without status migrainosus: Secondary | ICD-10-CM

## 2018-03-27 MED ORDER — ALPRAZOLAM 1 MG PO TABS: ORAL_TABLET | ORAL | 1 refills | Status: DC

## 2018-03-27 MED ORDER — FLUTICASONE-SALMETEROL 500-50 MCG/DOSE IN AEPB: INHALATION_SPRAY | RESPIRATORY_TRACT | 1 refills | Status: DC

## 2018-03-27 MED ORDER — ELETRIPTAN HYDROBROMIDE 40 MG PO TABS: 40.0000 mg | ORAL_TABLET | ORAL | 0 refills | Status: DC | PRN

## 2018-03-27 MED ORDER — CITALOPRAM HYDROBROMIDE 20 MG PO TABS: 60.0000 mg | ORAL_TABLET | Freq: Every day | ORAL | 2 refills | Status: DC

## 2018-03-27 MED ORDER — GLYCOPYRROLATE 1 MG PO TABS: 1.0000 mg | ORAL_TABLET | Freq: Every evening | ORAL | 5 refills | Status: DC
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
## 2018-03-27 NOTE — Patient Instructions (Addendum)
I will miss you!!!! It has been my pleasure getting to know you and your family. Thank you for allowing me the privilege in caring for your health.   I sent in a bunch of refills to your pharmacy.   Follow-up with Dr. Eliezer Mccoy or [place]. Check out the bios on the website.     If you have lab work done today you will be contacted with your lab results within the next 2 weeks.  If you have not heard from [place] then please contact us. The fastest way to get your results is to register for My Chart.   IF you received an x-ray today, you will receive an invoice from [facility]. Please contact [facility] at [phone] with questions or concerns regarding your invoice.   IF you received labwork today, you will receive an invoice from [place]. Please contact [facility] at [phone] with questions or concerns regarding your invoice.   Our billing staff will not be able to assist you with questions regarding bills from these companies.  You will be contacted with the lab results as soon as they are available. The fastest way to get your results is to activate your My Chart account. Instructions are located on the last page of this paperwork. If you have not heard from [place] regarding the results in 2 weeks, please contact this office.

## 2018-03-28 LAB — VITAMIN B12: Vitamin B-12: 248 pg/mL (ref 232–1245)

## 2018-04-03 ENCOUNTER — Encounter: Payer: Self-pay | Admitting: Physician Assistant

## 2018-04-03 NOTE — Progress Notes (Signed)
Result letter sent in mail. Your vitamin B12 level is on the low end of normal. While this is in the normal range, you may experience benefits from taking a daily vitamin B supplement.  Be sure the supplement has 500 to 1,000 mcg/day of B12.

## 2018-04-16 ENCOUNTER — Other Ambulatory Visit: Payer: Self-pay | Admitting: Physician Assistant

## 2018-04-16 DIAGNOSIS — G4700 Insomnia, unspecified: Secondary | ICD-10-CM

## 2018-04-21 ENCOUNTER — Other Ambulatory Visit: Payer: Self-pay | Admitting: Physician Assistant

## 2018-04-21 ENCOUNTER — Ambulatory Visit: Payer: Self-pay | Admitting: *Deleted

## 2018-04-21 NOTE — Telephone Encounter (Signed)
   Reason for Disposition . [1] Fever > 100.0 F (37.8 C) AND [2] diabetes mellitus or weak immune system (e.g., HIV positive, cancer chemo, splenectomy, organ transplant, chronic steroids)  Answer Assessment - Initial Assessment Questions 1. TEMPERATURE: "What is the most recent temperature?"  "How was it measured?"      101.0 oral yesterday 2. ONSET: "When did the fever start?"      Started 9 days ago. 3. SYMPTOMS: "Do you have any other symptoms besides the fever?"  (e.g., colds, headache, sore throat, earache, cough, rash, diarrhea, vomiting, abdominal pain)     Cough productive yellow and green phlegm since the start of fever. 4. CAUSE: If there are no symptoms, ask: "What do you think is causing the fever?"      Cold symptoms. 5. CONTACTS: "Does anyone else in the family have an infection?"     Husband has a mild cough.grandchildren with runny nose.  6. TREATMENT: "What have you done so far to treat this fever?" (e.g., medications)     Taken Nyquil the last 3 night. Ibuprofen for fever and abdominal hernia on left side of abdomen and one on the right side below the right breast.  7. IMMUNOCOMPROMISE: "Do you have of the following: diabetes, HIV positive, splenectomy, cancer chemotherapy, chronic steroid treatment, transplant patient, etc."     COPD on chronic steroid treatment.  8. PREGNANCY: "Is there any chance you are pregnant?" "When was your last menstrual period?"     no 9. TRAVEL: "Have you traveled out of the country in the last month?" (e.g., travel history, exposures)     no  Protocols used: FEVER-A-AH

## 2018-04-21 NOTE — Telephone Encounter (Signed)
Patient reports having a low grade fever for 9 days.Other symptoms include productive cough while taking mucinex producing yellow-greenish phlegm and nasal discharge/facial sinus pain and general aches. Hx of COPD with chronic steroid use. She taken tylenol and ibuprofen (prescription strength) that helps for a few hours. She would like a refill on the ibuprofen which has been submitted.  Also, reports she has 2 hernias that Whitney was aware of at her LOV and would like a GI referral for them. Reports "the right-sided hernia was near the groin but has moved upwards with all the coughing, recently". Offered an appointment today as per protocol but patient has no transportation and request appointment for tomorrow. Scheduled for 9:00am with Dr. Pamella Pert.

## 2018-04-21 NOTE — Telephone Encounter (Signed)
Copied from [facility] [id number]. Topic: Quick Communication - Rx Refill/Question >> Apr 21, 2018  9:32 AM Leward Quan A wrote: Medication: ibuprofen (ADVIL,MOTRIN) 800 MG tablet   Has the patient contacted their pharmacy? Yes.   (Agent: If no, request that the patient contact the pharmacy for the refill.) (Agent: If yes, when and what did the pharmacy advise?)  Preferred Pharmacy (with phone number or street name): [id number] - [place], [place] - [address] [phone] (Phone) [phone] (Fax)    Agent: Please be advised that RX refills may take up to 3 business days. We ask that you follow-up with your pharmacy.

## 2018-04-22 ENCOUNTER — Other Ambulatory Visit: Payer: Self-pay

## 2018-04-22 ENCOUNTER — Ambulatory Visit: Payer: 59 | Admitting: Family Medicine

## 2018-04-22 ENCOUNTER — Ambulatory Visit (INDEPENDENT_AMBULATORY_CARE_PROVIDER_SITE_OTHER): Payer: 59

## 2018-04-22 ENCOUNTER — Encounter: Payer: Self-pay | Admitting: Family Medicine

## 2018-04-22 VITALS — BP 134/91 | HR 103 | Temp 98.4°F | Ht 64.0 in | Wt 161.8 lb

## 2018-04-22 DIAGNOSIS — J441 Chronic obstructive pulmonary disease with (acute) exacerbation: Secondary | ICD-10-CM | POA: Diagnosis not present

## 2018-04-22 DIAGNOSIS — R059 Cough, unspecified: Secondary | ICD-10-CM

## 2018-04-22 DIAGNOSIS — R509 Fever, unspecified: Secondary | ICD-10-CM | POA: Diagnosis not present

## 2018-04-22 DIAGNOSIS — R05 Cough: Secondary | ICD-10-CM

## 2018-04-22 LAB — POCT CBC
Granulocyte percent: 84.5 %G — AB (ref 37–80)
HCT, POC: 41.1 % — AB (ref 29–41)
Hemoglobin: 13.8 g/dL — AB (ref 9.5–13.5)
Lymph, poc: 1.6 (ref 0.6–3.4)
MCH, POC: 32.3 pg — AB (ref 27–31.2)
MCHC: 33.6 g/dL (ref 31.8–35.4)
MCV: 96 fL (ref 76–111)
MID (cbc): 0.3 (ref 0–0.9)
MPV: 6.8 fL (ref 0–99.8)
POC Granulocyte: 10.4 — AB (ref 2–6.9)
POC LYMPH PERCENT: 12.9 %L (ref 10–50)
POC MID %: 2.6 %M (ref 0–12)
Platelet Count, POC: 370 10*3/uL (ref 142–424)
RBC: 4.28 M/uL (ref 4.04–5.48)
RDW, POC: 14.4 %
WBC: 12.3 10*3/uL — AB (ref 4.6–10.2)

## 2018-04-22 LAB — POC INFLUENZA A&B (BINAX/QUICKVUE)
Influenza A, POC: NEGATIVE
Influenza B, POC: NEGATIVE

## 2018-04-22 MED ORDER — LEVOFLOXACIN 500 MG PO TABS: 500.0000 mg | ORAL_TABLET | Freq: Every day | ORAL | 0 refills | Status: DC

## 2018-04-22 MED ORDER — IBUPROFEN 800 MG PO TABS: 800.0000 mg | ORAL_TABLET | Freq: Three times a day (TID) | ORAL | 0 refills | Status: DC | PRN

## 2018-04-22 MED ORDER — PREDNISONE 20 MG PO TABS: 40.0000 mg | ORAL_TABLET | Freq: Every day | ORAL | 0 refills | Status: DC

## 2018-04-22 MED ORDER — BENZONATATE 100 MG PO CAPS: 100.0000 mg | ORAL_CAPSULE | Freq: Three times a day (TID) | ORAL | 0 refills | Status: DC | PRN

## 2018-04-22 NOTE — Patient Instructions (Addendum)
     If you have lab work done today you will be contacted with your lab results within the next 2 weeks.  If you have not heard from [place] then please contact us. The fastest way to get your results is to register for My Chart.   IF you received an x-ray today, you will receive an invoice from [facility]. Please contact [facility] at [phone] with questions or concerns regarding your invoice.   IF you received labwork today, you will receive an invoice from [place]. Please contact [facility] at [phone] with questions or concerns regarding your invoice.   Our billing staff will not be able to assist you with questions regarding bills from these companies.  You will be contacted with the lab results as soon as they are available. The fastest way to get your results is to activate your My Chart account. Instructions are located on the last page of this paperwork. If you have not heard from [place] regarding the results in 2 weeks, please contact this office.     Chronic Obstructive Pulmonary Disease Exacerbation Chronic obstructive pulmonary disease (COPD) is a common lung problem. In COPD, the flow of air from the lungs is limited. COPD exacerbations are times that breathing gets worse and you need extra treatment. Without treatment they can be life threatening. If they happen often, your lungs can become more damaged. If your COPD gets worse, your doctor may treat you with:  Medicines.  Oxygen.  Different ways to clear your airway, such as using a mask.  Follow these instructions at home:  Do not smoke.  Avoid tobacco smoke and other things that bother your lungs.  If given, take your antibiotic medicine as told. Finish the medicine even if you start to feel better.  Only take medicines as told by your doctor.  Drink enough fluids to keep your pee (urine) clear or pale yellow (unless your doctor has told you not to).  Use a cool mist machine (vaporizer).  If  you use oxygen or a machine that turns liquid medicine into a mist (nebulizer), continue to use them as told.  Keep up with shots (vaccinations) as told by your doctor.  Exercise regularly.  Eat healthy foods.  Keep all doctor visits as told. Get help right away if:  You are very short of breath and it gets worse.  You have trouble talking.  You have bad chest pain.  You have blood in your spit (sputum).  You have a fever.  You keep throwing up (vomiting).  You feel weak, or you pass out (faint).  You feel confused.  You keep getting worse. This information is not intended to replace advice given to you by your health care provider. Make sure you discuss any questions you have with your health care provider. Document Released: 04/19/2011 Document Revised: 10/06/2015 Document Reviewed: 01/02/2013 Elsevier Interactive Patient Education  2017 [employer].

## 2018-04-22 NOTE — Telephone Encounter (Signed)
Requested medication (s) are due for refill today: yes  Requested medication (s) are on the active medication list: yes  Last refill:  Last filled by historical provider  Future visit scheduled: yes, 05/05/18  Notes to clinic:  Unable to refill per protocol. Last filled by historical provider.     Requested Prescriptions  Pending Prescriptions Disp Refills   ibuprofen (ADVIL,MOTRIN) 800 MG tablet 30 tablet 0    Sig: Take 1 tablet (800 mg total) by mouth every 8 (eight) hours as needed for mild pain.     Analgesics:  NSAIDS Failed - 04/22/2018 10:13 AM      Failed - HGB in normal range and within 360 days    Hemoglobin  Date Value Ref Range Status  04/22/2018 13.8 (A) 9.5 - 13.5 g/dL Final  01/17/2018 13.3 11.1 - 15.9 g/dL Final         Passed - Cr in normal range and within 360 days    Creatinine, Ser  Date Value Ref Range Status  01/17/2018 0.64 0.57 - 1.00 mg/dL Final         Passed - Patient is not pregnant      Passed - Valid encounter within last 12 months    Recent Outpatient Visits          Today Chronic obstructive pulmonary disease with acute exacerbation [facility])   [facility], Lilia Argue, MD   3 weeks ago Generalized anxiety disorder   Primary Care at [facility], Gelene Mink, PA-C   3 months ago Annual physical exam   Primary Care at [facility], Gelene Mink, PA-C   3 months ago COPD exacerbation [facility])   Primary Care at [facility], [place], PA-C   7 months ago Elevated blood pressure reading   Primary Care at [facility], Reather Laurence, PA-C      Future Appointments            In 1 week Rutherford Guys, MD Primary [facility] at [place], [facility]

## 2018-04-22 NOTE — Telephone Encounter (Signed)
Patient was seen today and need this rx refilled

## 2018-04-22 NOTE — Progress Notes (Signed)
12/10/20199:42 AM  Vicki [name] 08/10/1965, 52 y.o. female [id number]  Chief Complaint  Patient presents with  . Cough    fever and body aching with sneezing for the past 9 days. Fever seems to come at night. Taking mucinex, tylenol and motrin for the symptoms    HPI:   Patient is a 52 y.o. female with past medical history significant for asthma, COPD who presents today for cough  Coughing for about the last 2 weeks, was getting better and then got worse about 3 days, more mucous  Productive, greenish, yellowish Fevers at night, 101, fevers started about 7 days ago Also having lots of rhinorrhea + sinus pressure, headache + ear pressure No sore throat + Sob and wheezing Increased albuterol use. At least once a day, has not been helping for past 2 days Has been taking mucinex every 12 hours, ibuprofen, nyquil, cough drops Has had flu vaccine this year Cont to smoke Last exacerbation in aug 2019  Fall Risk  04/22/2018 03/27/2018 01/17/2018 09/12/2017 09/04/2017  Falls in the past year? 0 1 No No No     Depression screen [facility] 2/9 04/22/2018 03/27/2018 01/17/2018  Decreased Interest 0 0 0  Down, Depressed, Hopeless 0 0 0  PHQ - 2 Score 0 0 0    Allergies  Allergen Reactions  . Jasmine Oil Shortness Of Breath and Swelling  . Penicillins Anaphylaxis and Hives  . Aleve [Naproxen Sodium] Itching  . Benadryl [Diphenhydramine] Other (See Comments)    hallucinations  . Ceftin [Cefuroxime Axetil] Other (See Comments)    unsure  . Depo-Provera [Medroxyprogesterone Acetate] Other (See Comments)    Severe acne  . Doxycycline     Raises blood pressure   . Oxycodone Nausea And Vomiting  . Tramadol Other (See Comments)    Kept her up all night    Prior to Admission medications   Medication Sig Start Date End Date Taking? Authorizing Provider  albuterol (PROVENTIL HFA;VENTOLIN HFA) 108 (90 Base) MCG/ACT inhaler Inhale 2 puffs into the lungs every 4 (four) hours as needed for  wheezing or shortness of breath. 07/19/17  Yes McVey, Gelene Mink, PA-C  ALPRAZolam (XANAX) 1 MG tablet TAKE 1/2 TO 1 TABLETS (0.5-1 MG TOTAL) BY MOUTH DAILY AS NEEDED FOR ANXIETY OR SLEEP. 03/27/18  Yes McVey, Gelene Mink, PA-C  aspirin 81 MG tablet Take 81 mg by mouth daily.   Yes [provider]  atorvastatin (LIPITOR) 20 MG tablet Take 1 tablet (20 mg total) by mouth daily. 09/12/17  Yes Timmothy Euler, [place] D, PA-C  cetirizine (ZYRTEC) 10 MG tablet Take 1 tablet (10 mg total) by mouth daily. 07/19/17  Yes McVey, Gelene Mink, PA-C  citalopram (CELEXA) 20 MG tablet Take 3 tablets (60 mg total) by mouth daily. 03/27/18  Yes McVey, Gelene Mink, PA-C  cyclobenzaprine (FLEXERIL) 10 MG tablet Take 1 tablet (10 mg total) by mouth 3 (three) times daily as needed for muscle spasms. 01/17/18  Yes McVey, Gelene Mink, PA-C  eletriptan (RELPAX) 40 MG tablet Take 1 tablet (40 mg total) by mouth as needed for migraine or headache. Take at onset of headache. May repeat in 2 hrs if headache persists 03/27/18  Yes McVey, Gelene Mink, PA-C  Fluticasone-Salmeterol (ADVAIR DISKUS) 500-50 MCG/DOSE AEPB INHALE 1 PUFF INTO THE LUNGS TWICE A DAY 03/27/18  Yes McVey, Gelene Mink, PA-C  glycopyrrolate (ROBINUL) 1 MG tablet Take 1 tablet (1 mg total) by mouth every evening. 03/27/18  Yes McVey, Gelene Mink, PA-C  ibuprofen (ADVIL,MOTRIN) 800 MG tablet Take 800 mg by mouth every 8 (eight) hours as needed for mild pain.   Yes [provider]  meloxicam (MOBIC) 15 MG tablet TAKE 1 TABLET BY MOUTH EVERY DAY 03/20/18  Yes McVey, Gelene Mink, PA-C  rOPINIRole (REQUIP) 1 MG tablet TAKE 1 TABLET BY MOUTH AT BEDTIME. 03/26/18  Yes McVey, Gelene Mink, PA-C  tiotropium (SPIRIVA HANDIHALER) 18 MCG inhalation capsule Place 1 capsule (18 mcg total) into inhaler and inhale daily. 01/01/18  Yes McVey, Gelene Mink, PA-C  traZODone (DESYREL) 50 MG tablet Take 0.5-1 tablets  (25-50 mg total) by mouth at bedtime as needed for sleep. 09/18/17  Yes McVey, Gelene Mink, PA-C  traZODone (DESYREL) 50 MG tablet TAKE 1 TABLET BY MOUTH EVERYDAY AT BEDTIME 04/16/18  Yes McVey, Gelene Mink, PA-C  umeclidinium bromide (INCRUSE ELLIPTA) 62.5 MCG/INH AEPB Inhale 1 puff into the lungs daily. 01/08/18  Yes McVey, Gelene Mink, PA-C  varenicline (CHANTIX STARTING MONTH PAK) 0.5 MG X 11 & 1 MG X 42 tablet Take one 0.5 mg tablet by mouth once daily for 3 days, then increase to one 0.5 mg tablet twice daily for 4 days, then increase to one 1 mg tablet twice daily. 03/02/17  Yes Joretta Bachelor, PA    Past Medical History:  Diagnosis Date  . Asthma   . Carpal tunnel syndrome   . COPD (chronic obstructive pulmonary disease) ([place])   . Diverticulitis     Past Surgical History:  Procedure Laterality Date  . ABDOMINAL HYSTERECTOMY  2009  . APPENDECTOMY    . ARTHROSCOPIC REPAIR ACL Left 2011   x 2  . BUNIONECTOMY  2006   both  . CARPAL TUNNEL RELEASE Bilateral 10/28/2013   Procedure: BILATERAL CARPAL TUNNEL RELEASE;  Surgeon: Wynonia Sours, MD;  Location: [place];  Service: Orthopedics;  Laterality: Bilateral;  . DILATION AND CURETTAGE OF UTERUS    . TUBAL LIGATION  2004    Social History   Tobacco Use  . Smoking status: Current Every Day Smoker    Packs/day: 0.50    Years: 34.00    Pack years: 17.00  . Smokeless tobacco: Never Used  Substance Use Topics  . Alcohol use: Yes    Comment: occ    Family History  Problem Relation Age of Onset  . Cancer Mother   . Cancer Sister 37       breast cancer  . Breast cancer Sister   . Breast cancer Sister     ROS Per hpi  OBJECTIVE:  Blood pressure (!) 134/91, pulse (!) 103, temperature 98.4 F (36.9 C), temperature source Oral, height 5\' 4"  (1.626 m), weight 161 lb 12.8 oz (73.4 kg), SpO2 94 %. Body mass index is 27.77 kg/m.   Physical Exam  Constitutional: She is oriented to  person, place, and time. She appears well-developed and well-nourished. She appears ill.  HENT:  Head: Normocephalic and atraumatic.  Right Ear: Hearing, tympanic membrane, external ear and ear canal normal.  Left Ear: Hearing, tympanic membrane, external ear and ear canal normal.  Nose: Right sinus exhibits maxillary sinus tenderness and frontal sinus tenderness. Left sinus exhibits maxillary sinus tenderness and frontal sinus tenderness.  Mouth/Throat: Posterior oropharyngeal edema and posterior oropharyngeal erythema present.  Eyes: Pupils are equal, round, and reactive to light. Conjunctivae and EOM are normal.  Neck: Neck supple.  Cardiovascular: Normal rate, regular rhythm and normal heart sounds. Exam reveals no gallop and no friction rub.  No murmur heard. Pulmonary/Chest: Effort normal. She has decreased breath sounds. She has wheezes. She has no rhonchi. She has no rales.  Musculoskeletal: She exhibits no edema.  Lymphadenopathy:    She has no cervical adenopathy.  Neurological: She is alert and oriented to person, place, and time.  Skin: Skin is warm and dry.  Psychiatric: She has a normal mood and affect.  Nursing note and vitals reviewed.   Results for orders placed or performed in visit on 04/22/18 (from the past 24 hour(s))  POCT CBC     Status: Abnormal   Collection Time: 04/22/18 10:00 AM  Result Value Ref Range   WBC 12.3 (A) 4.6 - 10.2 K/uL   Lymph, poc 1.6 0.6 - 3.4   POC LYMPH PERCENT 12.9 10 - 50 %L   MID (cbc) 0.3 0 - 0.9   POC MID % 2.6 0 - 12 %M   POC Granulocyte 10.4 (A) 2 - 6.9   Granulocyte percent 84.5 (A) 37 - 80 %G   RBC 4.28 4.04 - 5.48 M/uL   Hemoglobin 13.8 (A) 9.5 - 13.5 g/dL   HCT, POC 41.1 (A) 29 - 41 %   MCV 96.0 76 - 111 fL   MCH, POC 32.3 (A) 27 - 31.2 pg   MCHC 33.6 31.8 - 35.4 g/dL   RDW, POC 14.4 %   Platelet Count, POC 370 142 - 424 K/uL   MPV 6.8 0 - 99.8 fL  POC Influenza A&B(BINAX/QUICKVUE)     Status: None   Collection Time:  04/22/18 10:19 AM  Result Value Ref Range   Influenza A, POC Negative Negative   Influenza B, POC Negative Negative    Dg Chest 2 View  Result Date: 04/22/2018 CLINICAL DATA:  Productive cough and fever in a cigarette smoker. EXAM: CHEST - 2 VIEW COMPARISON:  PA and lateral chest 02/28/2017. Single-view of the chest 02/12/2013 FINDINGS: The chest is hyperexpanded with attenuation of the pulmonary vasculature consistent with emphysema. Small focus of vague opacity in the right mid lung on the frontal view only is unchanged since the most recent examination and likely due to scar or overlapping structures. The lungs are otherwise clear. Heart size is normal. No pneumothorax or pleural effusion. Remote right sixth rib fracture is noted. No acute bony abnormality. IMPRESSION: No acute disease. Emphysema. Electronically Signed   By: Inge Rise M.D.   On: 04/22/2018 10:10     ASSESSMENT and PLAN  1. Chronic obstructive pulmonary disease with acute exacerbation (HCC) Discussed supportive measures, new meds r/se/b and RTC precautions. Patient educational handout given. Using levaquin given allergies.   2. Cough - POC Influenza A&B(BINAX/QUICKVUE) - POCT CBC - DG Chest 2 View; Future  3. Fever, unspecified - POC Influenza A&B(BINAX/QUICKVUE) - POCT CBC - DG Chest 2 View; Future  Other orders - levofloxacin (LEVAQUIN) 500 MG tablet; Take 1 tablet (500 mg total) by mouth daily. - benzonatate (TESSALON) 100 MG capsule; Take 1-2 capsules (100-200 mg total) by mouth 3 (three) times daily as needed for cough. - predniSONE (DELTASONE) 20 MG tablet; Take 2 tablets (40 mg total) by mouth daily with breakfast.  Return in about 2 weeks (around 05/06/2018), or if symptoms worsen or fail to improve.    Rutherford Guys, MD Primary Care at [place], [place] [address] Ph.  [phone] Fax [phone]

## 2018-04-27 ENCOUNTER — Other Ambulatory Visit: Payer: Self-pay | Admitting: Physician Assistant

## 2018-04-27 DIAGNOSIS — J449 Chronic obstructive pulmonary disease, unspecified: Secondary | ICD-10-CM

## 2018-04-29 ENCOUNTER — Ambulatory Visit: Payer: Self-pay

## 2018-04-29 NOTE — Telephone Encounter (Signed)
Pt called needs a refill on her inhaler umedidinium. Pt advised to call her pharmacy to have request resubmitted. Pt verbalized understanding of all instructions.  Reason for Disposition . Caller has medication question only, adult not sick, and triager answers question  Answer Assessment - Initial Assessment Questions 1. SYMPTOMS: "Do you have any symptoms?"     none 2. SEVERITY: If symptoms are present, ask "Are they mild, moderate or severe?"    I need a refill on my umedidinium inhaler  Protocols used: MEDICATION QUESTION CALL-A-AH

## 2018-05-01 ENCOUNTER — Telehealth: Payer: Self-pay | Admitting: Family Medicine

## 2018-05-01 NOTE — Telephone Encounter (Unsigned)
Copied from [place] [id number]. Topic: General - Other >> May 01, 2018  1:42 PM Carolyn Stare wrote:  Pt insurance will not cover umeclidinium bromide (INCRUSE ELLIPTA) 62.5 MCG/INH AEPB   they will cover INCRUSE and pt is asking if a RX can be sent in to her pharmacy.   [facility] [place]

## 2018-05-02 NOTE — Telephone Encounter (Signed)
Please advise 

## 2018-05-02 NOTE — Telephone Encounter (Signed)
Please call pharmacy and clarify. Also according to epic formulary info it should be covered. thanks

## 2018-05-05 ENCOUNTER — Ambulatory Visit: Payer: 59 | Admitting: Family Medicine

## 2018-05-09 NOTE — Telephone Encounter (Signed)
Called pharmacy.  State inhaler is covered.  Pt has not had filled since August but insurance will cover and patient is responsible for $35.  Call to pt to clarify/advise of coverage.  L/m to c/b.  Please advise of this info.  Pt can pick up at pharmacy.

## 2018-05-22 ENCOUNTER — Other Ambulatory Visit: Payer: Self-pay | Admitting: Physician Assistant

## 2018-05-22 DIAGNOSIS — G4700 Insomnia, unspecified: Secondary | ICD-10-CM

## 2018-05-22 NOTE — Telephone Encounter (Signed)
Appt due in 3 weeks filling for 30 days Requested Prescriptions  Pending Prescriptions Disp Refills  . traZODone (DESYREL) 50 MG tablet [Pharmacy Med Name: TRAZODONE 50 MG TABLET] 30 tablet 0    Sig: TAKE 1 TABLET BY MOUTH EVERYDAY AT BEDTIME     Psychiatry: Antidepressants - Serotonin Modulator Passed - 05/22/2018  2:37 AM      Passed - Valid encounter within last 6 months    Recent Outpatient Visits          1 month ago Chronic obstructive pulmonary disease with acute exacerbation [facility])   [facility] at [facility], Lilia Argue, MD   1 month ago Generalized anxiety disorder   Primary Care at [facility], Gelene Mink, PA-C   4 months ago Annual physical exam   Primary Care at [facility], Gelene Mink, PA-C   4 months ago COPD exacerbation [facility])   Primary Care at [facility], [place], PA-C   8 months ago Elevated blood pressure reading   Primary Care at [facility], Reather Laurence, PA-C      Future Appointments            In 3 weeks Rutherford Guys, MD Primary Care at [place], [facility]         \

## 2018-06-13 ENCOUNTER — Ambulatory Visit: Payer: 59 | Admitting: Family Medicine

## 2018-06-16 ENCOUNTER — Other Ambulatory Visit: Payer: Self-pay | Admitting: Physician Assistant

## 2018-06-16 ENCOUNTER — Other Ambulatory Visit: Payer: Self-pay | Admitting: *Deleted

## 2018-06-16 DIAGNOSIS — G2581 Restless legs syndrome: Secondary | ICD-10-CM

## 2018-06-16 MED ORDER — ROPINIROLE HCL 1 MG PO TABS: 1.0000 mg | ORAL_TABLET | Freq: Every day | ORAL | 0 refills | Status: DC

## 2018-06-16 NOTE — Telephone Encounter (Signed)
Requested medication (s) are due for refill today: yes  Requested medication (s) are on the active medication list: yes  Last refill:  03/26/18 for 90 tabs  Future visit scheduled: no  Notes to clinic:  Parkinsonian agents failed.  Requested Prescriptions  Pending Prescriptions Disp Refills   rOPINIRole (REQUIP) 1 MG tablet [Pharmacy Med Name: ROPINIROLE HCL 1 MG TABLET] 90 tablet 0    Sig: TAKE 1 TABLET BY MOUTH AT BEDTIME.     Neurology:  Parkinsonian Agents Failed - 06/16/2018  2:00 AM      Failed - Last BP in normal range    BP Readings from Last 1 Encounters:  04/22/18 (!) 134/91         Passed - Valid encounter within last 12 months    Recent Outpatient Visits          1 month ago Chronic obstructive pulmonary disease with acute exacerbation ([place])   [facility] at [facility], Lilia Argue, MD   2 months ago Generalized anxiety disorder   Primary Care at [facility], Gelene Mink, PA-C   5 months ago Annual physical exam   Primary Care at [facility], Gelene Mink, PA-C   5 months ago COPD exacerbation [facility])   Primary Care at [facility], [place], PA-C   9 months ago Elevated blood pressure reading   Primary Care at [facility], [place], [place]

## 2018-06-18 ENCOUNTER — Other Ambulatory Visit: Payer: Self-pay | Admitting: Physician Assistant

## 2018-06-18 DIAGNOSIS — J449 Chronic obstructive pulmonary disease, unspecified: Secondary | ICD-10-CM

## 2018-06-18 DIAGNOSIS — G4700 Insomnia, unspecified: Secondary | ICD-10-CM

## 2018-06-18 NOTE — Telephone Encounter (Signed)
Requested medication (s) are due for refill today: yes  Requested medication (s) are on the active medication list: Yes  Last refill:  05/22/18  Future visit scheduled: No  Notes to clinic:  See request    Requested Prescriptions  Pending Prescriptions Disp Refills   traZODone (DESYREL) 50 MG tablet [Pharmacy Med Name: TRAZODONE 50 MG TABLET] 90 tablet 1    Sig: TAKE 1 TABLET BY MOUTH EVERYDAY AT BEDTIME     Psychiatry: Antidepressants - Serotonin Modulator Passed - 06/18/2018 11:36 AM      Passed - Valid encounter within last 6 months    Recent Outpatient Visits          1 month ago Chronic obstructive pulmonary disease with acute exacerbation ([place])   [facility] at [facility], Lilia Argue, MD   2 months ago Generalized anxiety disorder   Primary Care at [facility], Gelene Mink, PA-C   5 months ago Annual physical exam   Primary Care at [facility], [place], PA-C   5 months ago COPD exacerbation [facility])   Primary Care at [facility], [place], PA-C   9 months ago Elevated blood pressure reading   Primary Care at [facility], [place] D, [place]

## 2018-06-20 NOTE — Telephone Encounter (Signed)
Please advise Patient is requesting a refill of the following medications: Requested Prescriptions   Pending Prescriptions Disp Refills  . traZODone (DESYREL) 50 MG tablet [Pharmacy Med Name: TRAZODONE 50 MG TABLET] 90 tablet 1    Sig: TAKE 1 TABLET BY MOUTH EVERYDAY AT BEDTIME

## 2018-07-01 ENCOUNTER — Ambulatory Visit (INDEPENDENT_AMBULATORY_CARE_PROVIDER_SITE_OTHER): Payer: 59

## 2018-07-01 ENCOUNTER — Encounter: Payer: Self-pay | Admitting: Family Medicine

## 2018-07-01 ENCOUNTER — Ambulatory Visit: Payer: 59 | Admitting: Family Medicine

## 2018-07-01 ENCOUNTER — Other Ambulatory Visit: Payer: Self-pay

## 2018-07-01 VITALS — BP 128/83 | HR 104 | Temp 99.2°F | Resp 16 | Ht 63.0 in | Wt 164.6 lb

## 2018-07-01 DIAGNOSIS — J441 Chronic obstructive pulmonary disease with (acute) exacerbation: Secondary | ICD-10-CM | POA: Diagnosis not present

## 2018-07-01 DIAGNOSIS — R05 Cough: Secondary | ICD-10-CM

## 2018-07-01 DIAGNOSIS — R059 Cough, unspecified: Secondary | ICD-10-CM

## 2018-07-01 DIAGNOSIS — J029 Acute pharyngitis, unspecified: Secondary | ICD-10-CM | POA: Diagnosis not present

## 2018-07-01 LAB — POCT RAPID STREP A (OFFICE): Rapid Strep A Screen: NEGATIVE

## 2018-07-01 MED ORDER — AZITHROMYCIN 250 MG PO TABS: ORAL_TABLET | ORAL | 0 refills | Status: DC

## 2018-07-01 MED ORDER — PREDNISONE 20 MG PO TABS: 40.0000 mg | ORAL_TABLET | Freq: Every day | ORAL | 0 refills | Status: DC

## 2018-07-01 MED ORDER — ALBUTEROL SULFATE (2.5 MG/3ML) 0.083% IN NEBU
2.5000 mg | INHALATION_SOLUTION | Freq: Once | RESPIRATORY_TRACT | Status: AC
  Administered 2018-07-01: 2.5 mg via RESPIRATORY_TRACT

## 2018-07-01 MED ORDER — IPRATROPIUM BROMIDE 0.02 % IN SOLN
0.5000 mg | Freq: Once | RESPIRATORY_TRACT | Status: AC
  Administered 2018-07-01: 0.5 mg via RESPIRATORY_TRACT

## 2018-07-01 NOTE — Patient Instructions (Addendum)
X-ray looked okay, but with your recent change in symptoms and current COPD flare, I do think prednisone and azithromycin will be helpful.  Additionally can use albuterol up to every 4-6 hours as needed for wheezing or cough as that seemed to improve your symptoms in the office.  If you not improving into the next 3 to 4 days or any worsening sooner, please return for recheck.   Return to the clinic or go to the nearest emergency room if any of your symptoms worsen or new symptoms occur.   Cough, Adult  Coughing is a reflex that clears your throat and your airways. Coughing helps to heal and protect your lungs. It is normal to cough occasionally, but a cough that happens with other symptoms or lasts a long time may be a sign of a condition that needs treatment. A cough may last only 2-3 weeks (acute), or it may last longer than 8 weeks (chronic). What are the causes? Coughing is commonly caused by:  Breathing in substances that irritate your lungs.  A viral or bacterial respiratory infection.  Allergies.  Asthma.  Postnasal drip.  Smoking.  Acid backing up from the stomach into the esophagus (gastroesophageal reflux).  Certain medicines.  Chronic lung problems, including COPD (or rarely, lung cancer).  Other medical conditions such as heart failure. Follow these instructions at home: Pay attention to any changes in your symptoms. Take these actions to help with your discomfort:  Take medicines only as told by your health care provider. ? If you were prescribed an antibiotic medicine, take it as told by your health care provider. Do not stop taking the antibiotic even if you start to feel better. ? Talk with your health care provider before you take a cough suppressant medicine.  Drink enough fluid to keep your urine clear or pale yellow.  If the air is dry, use a cold steam vaporizer or humidifier in your bedroom or your home to help loosen secretions.  Avoid anything that  causes you to cough at work or at home.  If your cough is worse at night, try sleeping in a semi-upright position.  Avoid cigarette smoke. If you smoke, quit smoking. If you need help quitting, ask your health care provider.  Avoid caffeine.  Avoid alcohol.  Rest as needed. Contact a health care provider if:  You have new symptoms.  You cough up pus.  Your cough does not get better after 2-3 weeks, or your cough gets worse.  You cannot control your cough with suppressant medicines and you are losing sleep.  You develop pain that is getting worse or pain that is not controlled with pain medicines.  You have a fever.  You have unexplained weight loss.  You have night sweats. Get help right away if:  You cough up blood.  You have difficulty breathing.  Your heartbeat is very fast. This information is not intended to replace advice given to you by your health care provider. Make sure you discuss any questions you have with your health care provider. Document Released: 10/27/2010 Document Revised: 10/06/2015 Document Reviewed: 07/07/2014 Elsevier Interactive Patient Education  [employer].   If you have lab work done today you will be contacted with your lab results within the next 2 weeks.  If you have not heard from [place] then please contact us. The fastest way to get your results is to register for My Chart.   IF you received an x-ray today, you will receive an  invoice from [facility]. Please contact [facility] at [phone] with questions or concerns regarding your invoice.   IF you received labwork today, you will receive an invoice from [place]. Please contact [facility] at [phone] with questions or concerns regarding your invoice.   Our billing staff will not be able to assist you with questions regarding bills from these companies.  You will be contacted with the lab results as soon as they are available. The fastest way to get your  results is to activate your My Chart account. Instructions are located on the last page of this paperwork. If you have not heard from [place] regarding the results in 2 weeks, please contact this office.

## 2018-07-01 NOTE — Progress Notes (Signed)
Subjective:    Patient ID: Vicki [name], female    DOB: 06-03-1965, 53 y.o.   MRN: [id number]  HPI  Vicki [name] is a 53 y.o. female Presents today for: Chief Complaint  Patient presents with  . Ear Pain    both ears hurt with the right side worse for the last 4 days  . Sore Throat    throat hurts when swollow for the last 7day, has a hx of strep throat 30 yrs ago  . Cough    for 2 wks was seen here 04/29/18 still have cough was given tessalon but has not been helping. Chest xray was neg at that time. have a sharp pain in left side of back area when cough   Presents for bilateral ear pain for the past 4 days, sore throat for the past 1 week, but cough present for about 2 weeks  Note reviewed from April 22, 2018, cough for 2 weeks at that time, getting better then got worse.  Increased albuterol use.  Chest x-ray indicated emphysema but no acute disease.  Thought to have COPD with acute exacerbation, was treated with Levaquin given her other allergies, prednisone 40 mg daily for 5 days and Tessalon Perles.  She has been prescribed Advair 500/50 twice daily, incruse ellipta QD - not spiriva.  Chronic cough with COPD, more cough past 2 weeks. More mucus past 2 weeks, clear mucus.  No fever.  Sore throat 1 week. No strep throat exposure. No foreign travel.  Both ears sore past 2 days. D/c in am few times in right.  Ears feel somewhat blocked - both sides .  Tx: mucinex, nyquil. sweet oil to R ear with cotton ball.  Some wheeze, not using albuterol as trying to reserve for only as needed.  Cough has caused spasms in back.  Drinking fluids ok.  meds in December helped sx's.    Patient Active Problem List   Diagnosis Date Noted  . Polypharmacy 07/19/2017  . Asthma   . Carpal tunnel syndrome   . COPD (chronic obstructive pulmonary disease) ([place])   . DDD (degenerative disc disease), lumbar 10/22/2016   Past Medical History:  Diagnosis Date  . Asthma   . Carpal tunnel  syndrome   . COPD (chronic obstructive pulmonary disease) ([place])   . Diverticulitis    Past Surgical History:  Procedure Laterality Date  . ABDOMINAL HYSTERECTOMY  2009  . APPENDECTOMY    . ARTHROSCOPIC REPAIR ACL Left 2011   x 2  . BUNIONECTOMY  2006   both  . CARPAL TUNNEL RELEASE Bilateral 10/28/2013   Procedure: BILATERAL CARPAL TUNNEL RELEASE;  Surgeon: Wynonia Sours, MD;  Location: [facility];  Service: Orthopedics;  Laterality: Bilateral;  . DILATION AND CURETTAGE OF UTERUS    . TUBAL LIGATION  2004   Allergies  Allergen Reactions  . Jasmine Oil Shortness Of Breath and Swelling  . Penicillins Anaphylaxis and Hives  . Aleve [Naproxen Sodium] Itching  . Benadryl [Diphenhydramine] Other (See Comments)    hallucinations  . Ceftin [Cefuroxime Axetil] Other (See Comments)    unsure  . Depo-Provera [Medroxyprogesterone Acetate] Other (See Comments)    Severe acne  . Doxycycline     Raises blood pressure   . Oxycodone Nausea And Vomiting  . Tramadol Other (See Comments)    Kept her up all night   Prior to Admission medications   Medication Sig Start Date End Date Taking? Authorizing Provider  albuterol (  PROVENTIL HFA;VENTOLIN HFA) 108 (90 Base) MCG/ACT inhaler Inhale 2 puffs into the lungs every 4 (four) hours as needed for wheezing or shortness of breath. 07/19/17  Yes McVey, Gelene Mink, PA-C  ALPRAZolam (XANAX) 1 MG tablet TAKE 1/2 TO 1 TABLETS (0.5-1 MG TOTAL) BY MOUTH DAILY AS NEEDED FOR ANXIETY OR SLEEP. 03/27/18  Yes McVey, Gelene Mink, PA-C  aspirin 81 MG tablet Take 81 mg by mouth daily.   Yes [provider]  atorvastatin (LIPITOR) 20 MG tablet Take 1 tablet (20 mg total) by mouth daily. 09/12/17  Yes Timmothy Euler, [place] D, PA-C  cetirizine (ZYRTEC) 10 MG tablet Take 1 tablet (10 mg total) by mouth daily. 07/19/17  Yes McVey, Gelene Mink, PA-C  citalopram (CELEXA) 20 MG tablet Take 3 tablets (60 mg total) by mouth daily. 03/27/18  Yes  McVey, Gelene Mink, PA-C  eletriptan (RELPAX) 40 MG tablet Take 1 tablet (40 mg total) by mouth as needed for migraine or headache. Take at onset of headache. May repeat in 2 hrs if headache persists 03/27/18  Yes McVey, Gelene Mink, PA-C  Fluticasone-Salmeterol (ADVAIR DISKUS) 500-50 MCG/DOSE AEPB INHALE 1 PUFF INTO THE LUNGS TWICE A DAY 06/18/18  Yes McVey, Gelene Mink, PA-C  glycopyrrolate (ROBINUL) 1 MG tablet Take 1 tablet (1 mg total) by mouth every evening. 03/27/18  Yes McVey, Gelene Mink, PA-C  ibuprofen (ADVIL,MOTRIN) 800 MG tablet Take 1 tablet (800 mg total) by mouth every 8 (eight) hours as needed for mild pain. 04/22/18  Yes Rutherford Guys, MD  rOPINIRole (REQUIP) 1 MG tablet Take 1 tablet (1 mg total) by mouth at bedtime. 06/16/18  Yes Rutherford Guys, MD  tiotropium (SPIRIVA HANDIHALER) 18 MCG inhalation capsule Place 1 capsule (18 mcg total) into inhaler and inhale daily. 01/01/18  Yes McVey, Gelene Mink, PA-C  traZODone (DESYREL) 50 MG tablet Take 0.5-1 tablets (25-50 mg total) by mouth at bedtime as needed for sleep. 09/18/17  Yes McVey, Gelene Mink, PA-C  traZODone (DESYREL) 50 MG tablet Take 1 tablet (50 mg total) by mouth at bedtime. 06/20/18  Yes Rutherford Guys, MD  umeclidinium bromide (INCRUSE ELLIPTA) 62.5 MCG/INH AEPB Inhale 1 puff into the lungs daily. 01/08/18  Yes McVey, Gelene Mink, PA-C   Social History   Socioeconomic History  . Marital status: Married    Spouse name: Vicki [name]  . Number of children: 3  . Years of education: 12th grade  . Highest education level: Not on file  Occupational History  . Occupation: Airline pilot: [employer]  Social Needs  . Financial resource strain: Not on file  . Food insecurity:    Worry: Not on file    Inability: Not on file  . Transportation needs:    Medical: Not on file    Non-medical: Not on file  Tobacco Use  . Smoking status: Current Every Day Smoker    Packs/day:  0.50    Years: 34.00    Pack years: 17.00  . Smokeless tobacco: Never Used  Substance and Sexual Activity  . Alcohol use: Yes    Comment: occ  . Drug use: No  . Sexual activity: Not on file  Lifestyle  . Physical activity:    Days per week: Not on file    Minutes per session: Not on file  . Stress: Not on file  Relationships  . Social connections:    Talks on phone: Not on file    Gets together: Not on file  Attends religious service: Not on file    Active member of club or organization: Not on file    Attends meetings of clubs or organizations: Not on file    Relationship status: Not on file  . Intimate partner violence:    Fear of current or ex partner: Not on file    Emotionally abused: Not on file    Physically abused: Not on file    Forced sexual activity: Not on file  Other Topics Concern  . Not on file  Social History Narrative   Lives with her husband.   Their 3 children live independently.    Review of Systems     Objective:   Physical Exam Vitals signs reviewed.  Constitutional:      General: She is not in acute distress.    Appearance: She is well-developed.  HENT:     Head: Normocephalic and atraumatic.     Right Ear: Hearing, ear canal and external ear normal. A middle ear effusion (min clear fluid at base, no perf, canal clear. ) is present.     Left Ear: Hearing, tympanic membrane, ear canal and external ear normal.     Nose: Nose normal.     Mouth/Throat:     Pharynx: No oropharyngeal exudate.  Eyes:     Conjunctiva/sclera: Conjunctivae normal.     Pupils: Pupils are equal, round, and reactive to light.  Cardiovascular:     Rate and Rhythm: Normal rate and regular rhythm.     Heart sounds: Normal heart sounds. No murmur.  Pulmonary:     Effort: Pulmonary effort is normal. No respiratory distress.     Breath sounds: Normal breath sounds. No stridor. No wheezing (diffuse expiratory. ) or rhonchi.  Skin:    General: Skin is warm and dry.      Findings: No rash.  Neurological:     Mental Status: She is alert and oriented to person, place, and time.  Psychiatric:        Behavior: Behavior normal.    Vitals:   07/01/18 1547 07/01/18 1550  BP: (!) 140/93 128/83  Pulse: (!) 104   Resp: 16   Temp: 99.2 F (37.3 C)   TempSrc: Oral   SpO2: 96%   Weight: 164 lb 9.6 oz (74.7 kg)   Height: 5\' 3"  (1.6 m)     Results for orders placed or performed in visit on 07/01/18  POCT rapid strep A  Result Value Ref Range   Rapid Strep A Screen Negative Negative  Dg Chest 2 View  Result Date: 07/01/2018 CLINICAL DATA:  Cough. EXAM: CHEST - 2 VIEW COMPARISON:  Radiographs of April 22, 2018. FINDINGS: The heart size and mediastinal contours are within normal limits. Both lungs are clear. The visualized skeletal structures are unremarkable. IMPRESSION: No active cardiopulmonary disease. Electronically Signed   By: Marijo Conception, M.D.   On: 07/01/2018 17:15   5:24 PM Less cough, improved aeration.  Feels better    Assessment & Plan:    SIHAAM CHROBAK is a 53 y.o. female COPD exacerbation ([place]) - Plan: albuterol (PROVENTIL) (2.5 MG/3ML) 0.083% nebulizer solution 2.5 mg, ipratropium (ATROVENT) nebulizer solution 0.5 mg, DG Chest 2 View, predniSONE (DELTASONE) 20 MG tablet, azithromycin (ZITHROMAX) 250 MG tablet  Sore throat - Plan: POCT rapid strep A  Cough - Plan: DG Chest 2 View  Suspected viral upper respiratory infection, but with COPD flare, increased mucus, will cover with azithromycin.  Prednisone 40 mg daily x5  days, potential side effects discussed.  Albuterol as discussed for home use for wheezing/coughing fits.  Understanding expressed, and RTC/ER precautions given.  Meds ordered this encounter  Medications  . albuterol (PROVENTIL) (2.5 MG/3ML) 0.083% nebulizer solution 2.5 mg  . ipratropium (ATROVENT) nebulizer solution 0.5 mg  . predniSONE (DELTASONE) 20 MG tablet    Sig: Take 2 tablets (40 mg total) by mouth  daily with breakfast.    Dispense:  10 tablet    Refill:  0  . azithromycin (ZITHROMAX) 250 MG tablet    Sig: Take 2 pills by mouth on day 1, then 1 pill by mouth per day on days 2 through 5.    Dispense:  6 tablet    Refill:  0   Patient Instructions    X-ray looked okay, but with your recent change in symptoms and current COPD flare, I do think prednisone and azithromycin will be helpful.  Additionally can use albuterol up to every 4-6 hours as needed for wheezing or cough as that seemed to improve your symptoms in the office.  If you not improving into the next 3 to 4 days or any worsening sooner, please return for recheck.   Return to the clinic or go to the nearest emergency room if any of your symptoms worsen or new symptoms occur.   Cough, Adult  Coughing is a reflex that clears your throat and your airways. Coughing helps to heal and protect your lungs. It is normal to cough occasionally, but a cough that happens with other symptoms or lasts a long time may be a sign of a condition that needs treatment. A cough may last only 2-3 weeks (acute), or it may last longer than 8 weeks (chronic). What are the causes? Coughing is commonly caused by:  Breathing in substances that irritate your lungs.  A viral or bacterial respiratory infection.  Allergies.  Asthma.  Postnasal drip.  Smoking.  Acid backing up from the stomach into the esophagus (gastroesophageal reflux).  Certain medicines.  Chronic lung problems, including COPD (or rarely, lung cancer).  Other medical conditions such as heart failure. Follow these instructions at home: Pay attention to any changes in your symptoms. Take these actions to help with your discomfort:  Take medicines only as told by your health care provider. ? If you were prescribed an antibiotic medicine, take it as told by your health care provider. Do not stop taking the antibiotic even if you start to feel better. ? Talk with your health  care provider before you take a cough suppressant medicine.  Drink enough fluid to keep your urine clear or pale yellow.  If the air is dry, use a cold steam vaporizer or humidifier in your bedroom or your home to help loosen secretions.  Avoid anything that causes you to cough at work or at home.  If your cough is worse at night, try sleeping in a semi-upright position.  Avoid cigarette smoke. If you smoke, quit smoking. If you need help quitting, ask your health care provider.  Avoid caffeine.  Avoid alcohol.  Rest as needed. Contact a health care provider if:  You have new symptoms.  You cough up pus.  Your cough does not get better after 2-3 weeks, or your cough gets worse.  You cannot control your cough with suppressant medicines and you are losing sleep.  You develop pain that is getting worse or pain that is not controlled with pain medicines.  You have a fever.  You  have unexplained weight loss.  You have night sweats. Get help right away if:  You cough up blood.  You have difficulty breathing.  Your heartbeat is very fast. This information is not intended to replace advice given to you by your health care provider. Make sure you discuss any questions you have with your health care provider. Document Released: 10/27/2010 Document Revised: 10/06/2015 Document Reviewed: 07/07/2014 Elsevier Interactive Patient Education  [employer].   If you have lab work done today you will be contacted with your lab results within the next 2 weeks.  If you have not heard from [place] then please contact us. The fastest way to get your results is to register for My Chart.   IF you received an x-ray today, you will receive an invoice from [facility]. Please contact [facility] at [phone] with questions or concerns regarding your invoice.   IF you received labwork today, you will receive an invoice from [facility]. Please contact [facility] at [phone]  with questions or concerns regarding your invoice.   Our billing staff will not be able to assist you with questions regarding bills from these companies.  You will be contacted with the lab results as soon as they are available. The fastest way to get your results is to activate your My Chart account. Instructions are located on the last page of this paperwork. If you have not heard from [place] regarding the results in 2 weeks, please contact this office.       Signed,   Merri Ray, MD [facility].  07/02/18 10:50 PM

## 2018-07-02 ENCOUNTER — Encounter: Payer: Self-pay | Admitting: Family Medicine

## 2018-09-16 ENCOUNTER — Other Ambulatory Visit: Payer: Self-pay | Admitting: Family Medicine

## 2018-09-16 DIAGNOSIS — G43909 Migraine, unspecified, not intractable, without status migrainosus: Secondary | ICD-10-CM

## 2018-09-16 DIAGNOSIS — F411 Generalized anxiety disorder: Secondary | ICD-10-CM

## 2018-09-16 DIAGNOSIS — R062 Wheezing: Secondary | ICD-10-CM

## 2018-09-16 NOTE — Telephone Encounter (Signed)
Copied from [place] [id number]. Topic: Quick Communication - Rx Refill/Question >> Sep 16, 2018  1:04 PM Izola Price, [place] A wrote: Medication: umeclidinium bromide (INCRUSE ELLIPTA) 62.5 MCG/INH AEPB ,eletriptan (RELPAX) 40 MG tablet ,ALPRAZolam (XANAX) 1 MG tablet   Has the patient contacted their pharmacy? Yes (Agent: If no, request that the patient contact the pharmacy for the refill.) (Agent: If yes, when and what did the pharmacy advise?)Contact PCP  Preferred Pharmacy (with phone number or street name):[id number] - [place], [place] - [address] [phone] (Phone) [phone] (Fax)    Agent: Please be advised that RX refills may take up to 3 business days. We ask that you follow-up with your pharmacy.

## 2018-09-17 ENCOUNTER — Other Ambulatory Visit: Payer: Self-pay | Admitting: Physician Assistant

## 2018-09-17 MED ORDER — ELETRIPTAN HYDROBROMIDE 40 MG PO TABS: 40.0000 mg | ORAL_TABLET | ORAL | 0 refills | Status: DC | PRN

## 2018-09-17 MED ORDER — ALPRAZOLAM 1 MG PO TABS: ORAL_TABLET | ORAL | 1 refills | Status: DC

## 2018-09-17 MED ORDER — UMECLIDINIUM BROMIDE 62.5 MCG/INH IN AEPB: 1.0000 | INHALATION_SPRAY | Freq: Every day | RESPIRATORY_TRACT | 6 refills | Status: DC

## 2018-09-17 NOTE — Telephone Encounter (Signed)
Patient is requesting a refill of the following medications: Requested Prescriptions    No prescriptions requested or ordered in this encounter   Relpax 40 mg Xanax 1 mg incruse ellipta 62.55mcg/inh

## 2018-09-17 NOTE — Telephone Encounter (Signed)
pmp reviewd, appropriate meds refilled Last OV Feb 2020

## 2018-10-15 ENCOUNTER — Other Ambulatory Visit: Payer: Self-pay | Admitting: Family Medicine

## 2018-10-15 DIAGNOSIS — G43909 Migraine, unspecified, not intractable, without status migrainosus: Secondary | ICD-10-CM

## 2018-10-19 ENCOUNTER — Other Ambulatory Visit: Payer: Self-pay | Admitting: Physician Assistant

## 2018-10-19 DIAGNOSIS — R61 Generalized hyperhidrosis: Secondary | ICD-10-CM

## 2018-10-19 NOTE — Telephone Encounter (Signed)
Requested Prescriptions  Pending Prescriptions Disp Refills  . glycopyrrolate (ROBINUL) 1 MG tablet [Pharmacy Med Name: GLYCOPYRROLATE 1 MG TABLET] 90 tablet 1    Sig: TAKE 1 TABLET BY MOUTH EVERY EVENING     Gastroenterology:  Antispasmodic Agents Passed - 10/19/2018  9:23 AM      Passed - Last Heart Rate in normal range    Pulse Readings from Last 1 Encounters:  07/01/18 (!) 104         Passed - Valid encounter within last 12 months    Recent Outpatient Visits          3 months ago COPD exacerbation ([place])   [facility] Ranell Patrick, MD   6 months ago Chronic obstructive pulmonary disease with acute exacerbation [facility])   [facility], Lilia Argue, MD   6 months ago Generalized anxiety disorder   [facility] at [facility], Gelene Mink, PA-C   9 months ago Annual physical exam   [facility] at [facility] [place], PA-C   9 months ago COPD exacerbation [facility])   [facility] at [facility], Gelene Mink, [place]

## 2018-10-29 ENCOUNTER — Ambulatory Visit: Payer: Self-pay | Admitting: *Deleted

## 2018-10-29 NOTE — Telephone Encounter (Signed)
Pt scheduled with [place] on 6/18/2020j. Dgaddy, CMA

## 2018-10-29 NOTE — Telephone Encounter (Signed)
Pt called stating that she sleeps all the time, has no energy and feels fatigue; this started on 10/25/2018; she also complains of no appetite; recommendations made per nurse triage protocol; the pt says that she does not want to go to the ED; pt transferred to [facility] for scheduling.   Reason for Disposition . [1] MODERATE weakness (i.e., interferes with work, school, normal activities) AND [2] cause unknown  (Exceptions: weakness with acute minor illness, or weakness from poor fluid intake)  Answer Assessment - Initial Assessment Questions 1. DESCRIPTION: "Describe how you are feeling."     fatigue 2. SEVERITY: "How bad is it?"  "Can you stand and walk?"   - MILD - Feels weak or tired, but does not interfere with work, school or normal activities   - [place] to stand and walk; weakness interferes with work, school, or normal activities   - SEVERE - Unable to stand or walk     Moderate to severe 3. ONSET:  "When did the weakness begin?"     10/25/2018 4. CAUSE: "What do you think is causing the weakness?"     Not sure 5. MEDICINES: "Have you recently started a new medicine or had a change in the amount of a medicine?"     no 6. OTHER SYMPTOMS: "Do you have any other symptoms?" (e.g., chest pain, fever, cough, SOB, vomiting, diarrhea, bleeding, other areas of pain)    Diarrhea for the last 2 days (2 episodes in the past 2 hours) 7. PREGNANCY: "Is there any chance you are pregnant?" "When was your last menstrual period?"    No hysterectomy  Protocols used: WEAKNESS (GENERALIZED) AND FATIGUE-A-AH

## 2018-10-30 ENCOUNTER — Telehealth: Payer: Self-pay | Admitting: General Practice

## 2018-10-30 ENCOUNTER — Ambulatory Visit: Payer: Self-pay | Admitting: *Deleted

## 2018-10-30 ENCOUNTER — Telehealth (INDEPENDENT_AMBULATORY_CARE_PROVIDER_SITE_OTHER): Payer: 59 | Admitting: Family Medicine

## 2018-10-30 DIAGNOSIS — R6889 Other general symptoms and signs: Secondary | ICD-10-CM | POA: Diagnosis not present

## 2018-10-30 DIAGNOSIS — Z20822 Contact with and (suspected) exposure to covid-19: Secondary | ICD-10-CM

## 2018-10-30 NOTE — Progress Notes (Signed)
Virtual Visit Note  I connected with patient on 10/30/18 at 207 pm by phone and verified that I am speaking with the correct person using two identifiers. Vicki [name] is currently located at home and patient and husband is currently with them during visit. The provider, Rutherford Guys, MD is located in their office at time of visit.  I discussed the limitations, risks, security and privacy concerns of performing an evaluation and management service by telephone and the availability of in person appointments. I also discussed with the patient that there may be a patient responsible charge related to this service. The patient expressed understanding and agreed to proceed.   CC: tired  HPI ? Last Saturday, 6 days ago, started feeling very tired and sleepy Tmax 99.7, but having night sweats and chills SOB and cough from COPD, not acutely worse this past week No increase in chest tightness or wheezing Denies any headaches or sore throat Reports loss of taste and painful tongue, feels really sore Having dry mouth Having nausea and diarrhea, no vomiting Husband also has diarrhea for about the past 4 days Had similar illness in March -recovered well Able to keep eat and keep hydrated Smokes  PMHx: COPD, asthma, RLS, insomnia, anxiety, HLP   Allergies  Allergen Reactions  . Jasmine Oil Shortness Of Breath and Swelling  . Penicillins Anaphylaxis and Hives  . Aleve [Naproxen Sodium] Itching  . Benadryl [Diphenhydramine] Other (See Comments)    hallucinations  . Ceftin [Cefuroxime Axetil] Other (See Comments)    unsure  . Depo-Provera [Medroxyprogesterone Acetate] Other (See Comments)    Severe acne  . Doxycycline     Raises blood pressure   . Oxycodone Nausea And Vomiting  . Tramadol Other (See Comments)    Kept her up all night    Prior to Admission medications   Medication Sig Start Date End Date Taking? Authorizing Provider  albuterol (PROVENTIL HFA;VENTOLIN HFA)  108 (90 Base) MCG/ACT inhaler Inhale 2 puffs into the lungs every 4 (four) hours as needed for wheezing or shortness of breath. 07/19/17   McVey, Gelene Mink, PA-C  ALPRAZolam (XANAX) 1 MG tablet TAKE 1/2 TO 1 TABLETS (0.5-1 MG TOTAL) BY MOUTH DAILY AS NEEDED FOR ANXIETY OR SLEEP. 09/17/18   Rutherford Guys, MD  aspirin 81 MG tablet Take 81 mg by mouth daily.    [provider]  atorvastatin (LIPITOR) 20 MG tablet TAKE 1 TABLET BY MOUTH EVERY DAY 09/17/18   Tenna Delaine D, PA-C  azithromycin (ZITHROMAX) 250 MG tablet Take 2 pills by mouth on day 1, then 1 pill by mouth per day on days 2 through 5. 07/01/18   Wendie Agreste, MD  cetirizine (ZYRTEC) 10 MG tablet Take 1 tablet (10 mg total) by mouth daily. 07/19/17   McVey, Gelene Mink, PA-C  citalopram (CELEXA) 20 MG tablet Take 3 tablets (60 mg total) by mouth daily. 03/27/18   McVey, Gelene Mink, PA-C  eletriptan (RELPAX) 40 MG tablet Take 1 tablet (40 mg total) by mouth as needed for migraine or headache. Take at onset of headache. May repeat in 2 hrs if headache persists 09/17/18   Rutherford Guys, MD  Fluticasone-Salmeterol (ADVAIR DISKUS) 500-50 MCG/DOSE AEPB INHALE 1 PUFF INTO THE LUNGS TWICE A DAY 06/18/18   McVey, Gelene Mink, PA-C  glycopyrrolate (ROBINUL) 1 MG tablet TAKE 1 TABLET BY MOUTH EVERY EVENING 10/19/18   Rutherford Guys, MD  ibuprofen (ADVIL,MOTRIN) 800 MG tablet Take 1 tablet (  800 mg total) by mouth every 8 (eight) hours as needed for mild pain. 04/22/18   Rutherford Guys, MD  predniSONE (DELTASONE) 20 MG tablet Take 2 tablets (40 mg total) by mouth daily with breakfast. 07/01/18   Wendie Agreste, MD  rOPINIRole (REQUIP) 1 MG tablet Take 1 tablet (1 mg total) by mouth at bedtime. 06/16/18   Rutherford Guys, MD  tiotropium (SPIRIVA HANDIHALER) 18 MCG inhalation capsule Place 1 capsule (18 mcg total) into inhaler and inhale daily. 01/01/18   McVey, Gelene Mink, PA-C  traZODone (DESYREL) 50 MG tablet  Take 0.5-1 tablets (25-50 mg total) by mouth at bedtime as needed for sleep. 09/18/17   McVey, Gelene Mink, PA-C  traZODone (DESYREL) 50 MG tablet Take 1 tablet (50 mg total) by mouth at bedtime. 06/20/18   Rutherford Guys, MD  umeclidinium bromide (INCRUSE ELLIPTA) 62.5 MCG/INH AEPB Inhale 1 puff into the lungs daily. 09/17/18   Rutherford Guys, MD    Past Medical History:  Diagnosis Date  . Asthma   . Carpal tunnel syndrome   . COPD (chronic obstructive pulmonary disease) ([place])   . Diverticulitis     Past Surgical History:  Procedure Laterality Date  . ABDOMINAL HYSTERECTOMY  2009  . APPENDECTOMY    . ARTHROSCOPIC REPAIR ACL Left 2011   x 2  . BUNIONECTOMY  2006   both  . CARPAL TUNNEL RELEASE Bilateral 10/28/2013   Procedure: BILATERAL CARPAL TUNNEL RELEASE;  Surgeon: Wynonia Sours, MD;  Location: [facility];  Service: Orthopedics;  Laterality: Bilateral;  . DILATION AND CURETTAGE OF UTERUS    . TUBAL LIGATION  2004    Social History   Tobacco Use  . Smoking status: Current Every Day Smoker    Packs/day: 0.50    Years: 34.00    Pack years: 17.00  . Smokeless tobacco: Never Used  Substance Use Topics  . Alcohol use: Yes    Comment: occ    Family History  Problem Relation Age of Onset  . Cancer Mother   . Cancer Sister 77       breast cancer  . Breast cancer Sister   . Breast cancer Sister     ROS Per hpi  Objective  Vitals as reported by the patient: none Per conversation: patient is AAOx3, NAD speaking in full sentences, comfortable No cough appreciated   ASSESSMENT and PLAN  1. Suspected Covid-19 Virus Infection Patient no in resp distress, able to maintain hydration and exhibiting normal mentation. Patient being referred for testing given co-morbidities. Discussed home isolation guidelines. ER precautions reviewed.    FOLLOW-UP: 3-4 days   The above assessment and management plan was discussed with the patient. The patient  verbalized understanding of and has agreed to the management plan. Patient is aware to call the clinic if symptoms persist or worsen. Patient is aware when to return to the clinic for a follow-up visit. Patient educated on when it is appropriate to go to the emergency department.    I provided 16 minutes of non-face-to-face time during this encounter.  Rutherford Guys, MD Primary Care at [place], [place] [address] Ph.  [phone] Fax [phone]

## 2018-10-30 NOTE — Progress Notes (Signed)
sleeping too much, sleeps 6-7 hours an day not including the night time sleeping. Went to the dentist last week had crown done, no medication was used per the pt. Does not take the xanax due to the tiredness she is already feeling. Having night sweat day and night when she is sleeping to the point she is having to change clothes.. Still taking the trazodone 1 per night, taking a whole tab instead of half Says she is having anxiety and depression gad 7 score 14

## 2018-10-30 NOTE — Telephone Encounter (Signed)
Vicki [name] called in for wife.  She is laying down.  She needs to be scheduled for the COVID-19.  I scheduled her for 10/31/2018 at 9:00 at the [facility] location in [place].  I made him aware that she needs to stay in the car and wear a mask when she goes for testing.  Order entered.

## 2018-10-30 NOTE — Telephone Encounter (Signed)
Called pt to schedule for covid testing Left vm for pt to return call at: [phone].

## 2018-10-31 ENCOUNTER — Other Ambulatory Visit: Payer: 59

## 2018-10-31 DIAGNOSIS — Z20822 Contact with and (suspected) exposure to covid-19: Secondary | ICD-10-CM

## 2018-11-03 ENCOUNTER — Telehealth: Payer: Self-pay | Admitting: Family Medicine

## 2018-11-03 ENCOUNTER — Ambulatory Visit: Payer: Self-pay | Admitting: Family Medicine

## 2018-11-03 NOTE — Telephone Encounter (Signed)
Summary: Pt callback   Pt was recently tested for COVID-19. She has experienced nausea and vomiting for 9 days and would like a prescription to be sent in for her to stop her vomiting.          Pt is requesting an antiemetic for her vomiting.  She has tried emetrol and pepto bismol without relief.  She has lost 8 lbs. This morning she has been able to keep down 32 ounces since 9 am. She had a virtual visit with her provider on this past Thursday and was scheduled to take the covid-19 test. She stated that she has been having the same symptoms.  She would like a call back for an antiemetic. Routing to flow for review and recommendation.

## 2018-11-03 NOTE — Telephone Encounter (Signed)
  Reason for Disposition . [1] Request for URGENT new prescription or refill of "essential" medication (i.e., likelihood of harm to patient if not taken) AND [2] triager unable to fill per unit policy  Answer Assessment - Initial Assessment Questions 1. SYMPTOMS: "Do you have any symptoms?"     yes 2. SEVERITY: If symptoms are present, ask "Are they mild, moderate or severe?"      Moderate  Protocols used: MEDICATION QUESTION CALL-A-AH

## 2018-11-03 NOTE — Telephone Encounter (Signed)
Patient calling to check status of getting something sent to the pharmacy for nausea/vomitting. Please advise.

## 2018-11-03 NOTE — Telephone Encounter (Signed)
Pt is vomiting and needs a prescription for nausea. Pt was last seen on 10/30/2018.

## 2018-11-03 NOTE — Telephone Encounter (Signed)
Is it ok to send this pt something for N/V? Please advise

## 2018-11-04 ENCOUNTER — Ambulatory Visit: Payer: Self-pay

## 2018-11-04 LAB — NOVEL CORONAVIRUS, NAA: SARS-CoV-2, NAA: NOT DETECTED

## 2018-11-04 MED ORDER — ONDANSETRON HCL 8 MG PO TABS: 8.0000 mg | ORAL_TABLET | Freq: Three times a day (TID) | ORAL | 0 refills | Status: DC | PRN

## 2018-11-04 NOTE — Telephone Encounter (Signed)
Called and informed pt of rx sent to pharmacy.

## 2018-11-04 NOTE — Telephone Encounter (Signed)
Please let patient know that I sent in a rx for zofran. thanks

## 2018-11-04 NOTE — Telephone Encounter (Signed)
Medication sent.

## 2018-11-04 NOTE — Telephone Encounter (Signed)
Pt covid negative and pt wanted to verify.- Pt stated that her SOB, vomiting, diarrhea and feels like she has the flu, sweating episodes that make her skin feel like their on fire. Pt has been sick since 10/25/18 and having multiple sx: headache, diarrhea, nausea, vomiting. Pt is wanting an appt. Call transferred to [facility]   Reason for Disposition . HIGH RISK patient (e.g., age > 46 years, diabetes, heart or lung disease, weak immune system)  Answer Assessment - Initial Assessment Questions 1. COVID-19 DIAGNOSIS: "Who made your Coronavirus (COVID-19) diagnosis?" "Was it confirmed by a positive lab test?" If not diagnosed by a HCP, ask "Are there lots of cases (community spread) where you live?" (See public health department website, if unsure)     Dr Pamella Pert- covid negative 2. ONSET: "When did the COVID-19 symptoms start?"      10/25/18 3. WORST SYMPTOM: "What is your worst symptom?" (e.g., cough, fever, shortness of breath, muscle aches)     Nausea and vomiting, fatigue, black  4. COUGH: "Do you have a cough?" If so, ask: "How bad is the cough?"       Yes-past 2 days gotten worse, ? Post nasal drip, runny nose 5. FEVER: "Do you have a fever?" If so, ask: "What is your temperature, how was it measured, and when did it start?"     Yes low grade- feels hot at night skin feels like its burning all the time, chills 6. RESPIRATORY STATUS: "Describe your breathing?" (e.g., shortness of breath, wheezing, unable to speak)      H/o COPD- SOB at night at rest  7. BETTER-SAME-WORSE: "Are you getting better, staying the same or getting worse compared to yesterday?"  If getting worse, ask, "In what way?"     Better except for nausea 8. HIGH RISK DISEASE: "Do you have any chronic medical problems?" (e.g., asthma, heart or lung disease, weak immune system, etc.)     COPD 9. PREGNANCY: "Is there any chance you are pregnant?" "When was your last menstrual period?"     N/a- n/a 10. OTHER SYMPTOMS: "Do you  have any other symptoms?"  (e.g., chills, fatigue, headache, loss of smell or taste, muscle pain, sore throat)       Fatigue cold sweats, diarrhea, nausea, vomiting, headache after vomiting, body aches  Protocols used: CORONAVIRUS (COVID-19) DIAGNOSED OR SUSPECTED-A-AH

## 2018-11-05 ENCOUNTER — Telehealth: Payer: Self-pay | Admitting: Family Medicine

## 2018-11-05 NOTE — Telephone Encounter (Signed)
Pt calledPEC because she had a missed call from our office . Pt would like a call back the patient is worried she might have missed something .  [phone]    FR

## 2018-11-05 NOTE — Telephone Encounter (Signed)
Pt has been scheduled.  °

## 2018-11-06 ENCOUNTER — Telehealth (INDEPENDENT_AMBULATORY_CARE_PROVIDER_SITE_OTHER): Payer: 59 | Admitting: Family Medicine

## 2018-11-06 ENCOUNTER — Other Ambulatory Visit: Payer: Self-pay

## 2018-11-06 DIAGNOSIS — G4700 Insomnia, unspecified: Secondary | ICD-10-CM

## 2018-11-06 DIAGNOSIS — R5383 Other fatigue: Secondary | ICD-10-CM

## 2018-11-06 DIAGNOSIS — F329 Major depressive disorder, single episode, unspecified: Secondary | ICD-10-CM | POA: Diagnosis not present

## 2018-11-06 DIAGNOSIS — F32A Depression, unspecified: Secondary | ICD-10-CM

## 2018-11-06 NOTE — Progress Notes (Signed)
Telemedicine Encounter- SOAP NOTE Established Patient  I discussed the limitations, risks, security and privacy concerns of performing an evaluation and management service by telephone and the availability of in person appointments. I also discussed with the patient that there may be a patient responsible charge related to this service. The patient expressed understanding and agreed to proceed.  This telephone encounter was conducted with the patient's (or proxy's) verbal consent via audio telecommunications: yes Patient was instructed to have this encounter in a suitably private space; and to only have persons present to whom they give permission to participate. In addition, patient identity was confirmed by use of name plus two identifiers (DOB and address).  I spent a total of 61min talking with the patient or their proxy.  Spoke with pt and she informed me that she was concerned about her symptoms. She states she has been tested for COVID and her results was negative burt she sill is having fatigue and some nausea. She was prescribed medication for the N/V feeling so she states that made her feel a little better. She states she just would like to follow-up to see is there anything else she can do about the Fatigue.   Vicki [name] is a 53 y.o. female established patient. Telephone visit today for fatigue and insomnia. Pt taking trazodone, celexa for depression and insomnia. Pt with trouble going to sleep and getting extended sleep. Pt states she is having fatigue and tingling in her body. No weakness on UE/LE. No difficulty with speech, swallowing, talking or ADL.  Pt states no difficulty with asthma   Patient Active Problem List   Diagnosis Date Noted  . Polypharmacy 07/19/2017  . Asthma   . Carpal tunnel syndrome   . COPD (chronic obstructive pulmonary disease) ([place])   . DDD (degenerative disc disease), lumbar 10/22/2016    Past Medical History:  Diagnosis Date  . Asthma    . Carpal tunnel syndrome   . COPD (chronic obstructive pulmonary disease) ([place])   . Diverticulitis     Current Outpatient Medications  Medication Sig Dispense Refill  . albuterol (PROVENTIL HFA;VENTOLIN HFA) 108 (90 Base) MCG/ACT inhaler Inhale 2 puffs into the lungs every 4 (four) hours as needed for wheezing or shortness of breath. 1 Inhaler 0  . ALPRAZolam (XANAX) 1 MG tablet TAKE 1/2 TO 1 TABLETS (0.5-1 MG TOTAL) BY MOUTH DAILY AS NEEDED FOR ANXIETY OR SLEEP. 30 tablet 1  . aspirin 81 MG tablet Take 81 mg by mouth daily.    Marland Kitchen atorvastatin (LIPITOR) 20 MG tablet TAKE 1 TABLET BY MOUTH EVERY DAY 90 tablet 0  . azithromycin (ZITHROMAX) 250 MG tablet Take 2 pills by mouth on day 1, then 1 pill by mouth per day on days 2 through 5. 6 tablet 0  . cetirizine (ZYRTEC) 10 MG tablet Take 1 tablet (10 mg total) by mouth daily. 30 tablet 11  . citalopram (CELEXA) 20 MG tablet Take 3 tablets (60 mg total) by mouth daily. 270 tablet 2  . eletriptan (RELPAX) 40 MG tablet Take 1 tablet (40 mg total) by mouth as needed for migraine or headache. Take at onset of headache. May repeat in 2 hrs if headache persists 10 tablet 0  . Fluticasone-Salmeterol (ADVAIR DISKUS) 500-50 MCG/DOSE AEPB INHALE 1 PUFF INTO THE LUNGS TWICE A DAY 60 each 1  . glycopyrrolate (ROBINUL) 1 MG tablet TAKE 1 TABLET BY MOUTH EVERY EVENING 90 tablet 1  . ibuprofen (ADVIL,MOTRIN) 800 MG tablet Take  1 tablet (800 mg total) by mouth every 8 (eight) hours as needed for mild pain. 30 tablet 0  . ondansetron (ZOFRAN) 8 MG tablet Take 1 tablet (8 mg total) by mouth every 8 (eight) hours as needed for nausea or vomiting. 20 tablet 0  . predniSONE (DELTASONE) 20 MG tablet Take 2 tablets (40 mg total) by mouth daily with breakfast. 10 tablet 0  . rOPINIRole (REQUIP) 1 MG tablet Take 1 tablet (1 mg total) by mouth at bedtime. 30 tablet 0  . tiotropium (SPIRIVA HANDIHALER) 18 MCG inhalation capsule Place 1 capsule (18 mcg total) into inhaler and  inhale daily. 30 capsule 12  . traZODone (DESYREL) 50 MG tablet Take 0.5-1 tablets (25-50 mg total) by mouth at bedtime as needed for sleep. 30 tablet 0  . traZODone (DESYREL) 50 MG tablet Take 1 tablet (50 mg total) by mouth at bedtime. 90 tablet 1  . umeclidinium bromide (INCRUSE ELLIPTA) 62.5 MCG/INH AEPB Inhale 1 puff into the lungs daily. 30 each 6   No current facility-administered medications for this visit.     Allergies  Allergen Reactions  . Jasmine Oil Shortness Of Breath and Swelling  . Penicillins Anaphylaxis and Hives  . Aleve [Naproxen Sodium] Itching  . Benadryl [Diphenhydramine] Other (See Comments)    hallucinations  . Ceftin [Cefuroxime Axetil] Other (See Comments)    unsure  . Depo-Provera [Medroxyprogesterone Acetate] Other (See Comments)    Severe acne  . Doxycycline     Raises blood pressure   . Oxycodone Nausea And Vomiting  . Tramadol Other (See Comments)    Kept her up all night    Social History   Socioeconomic History  . Marital status: Married    Spouse name: [name]  . Number of children: 3  . Years of education: 12th grade  . Highest education level: Not on file  Occupational History  . Occupation: Airline pilot: [employer]  Social Needs  . Financial resource strain: Not on file  . Food insecurity    Worry: Not on file    Inability: Not on file  . Transportation needs    Medical: Not on file    Non-medical: Not on file  Tobacco Use  . Smoking status: Current Every Day Smoker    Packs/day: 0.50    Years: 34.00    Pack years: 17.00  . Smokeless tobacco: Never Used  Substance and Sexual Activity  . Alcohol use: Yes    Comment: occ  . Drug use: No  . Sexual activity: Not on file  Lifestyle  . Physical activity    Days per week: Not on file    Minutes per session: Not on file  . Stress: Not on file  Relationships  . Social Herbalist on phone: Not on file    Gets together: Not on file    Attends  religious service: Not on file    Active member of club or organization: Not on file    Attends meetings of clubs or organizations: Not on file    Relationship status: Not on file  . Intimate partner violence    Fear of current or ex partner: Not on file    Emotionally abused: Not on file    Physically abused: Not on file    Forced sexual activity: Not on file  Other Topics Concern  . Not on file  Social History Narrative   Lives with her husband.  Their 3 children live independently.    Review of Systems  Constitutional: Positive for malaise/fatigue and weight loss. Negative for fever.  HENT: Positive for congestion and sinus pain.   Respiratory: Negative for cough, shortness of breath and wheezing.   Cardiovascular: Negative for chest pain and palpitations.  Gastrointestinal: Positive for abdominal pain, diarrhea, nausea and vomiting.  Genitourinary: Negative for dysuria and hematuria.  Skin: Negative for rash.  Neurological: Positive for dizziness, tingling, weakness and headaches. Negative for sensory change, focal weakness and seizures.  Endo/Heme/Allergies: Negative for environmental allergies. Does not bruise/bleed easily.  Psychiatric/Behavioral: Positive for depression. The patient is nervous/anxious and has insomnia.   bland diet only ADL with no diffiuculty Sleep restless Objective   Vitals as reported by the patient: Temp 98.2,  1. Fatigue, unspecified type Ongoing fatigue and depression-recent concern with worsening fatigue, headaches, nausea and body tingling Insomnia worsened-COVID negative  2. Depression, unspecified depression type No SI/HI  3. Insomnia, unspecified type Trial of melatonin suggested, sleep architecture discussed  I discussed the assessment and treatment plan with the patient. The patient was provided an opportunity to ask questions and all were answered. The patient agreed with the plan and demonstrated an understanding of the  instructions.   The patient was advised to call back or seek an in-person evaluation if the symptoms worsen or if the condition fails to improve as anticipated.  I provided 25minutes of non-face-to-face time during this encounter.  Kayte Borchard Hannah Beat, MD  Primary Care at [facility]

## 2018-11-06 NOTE — Progress Notes (Signed)
Spoke with pt and she informed me that she was concerned about her symptoms. She states she has been tested for COVID and her results was negative burt she sill is having fatigue and some nausea. She was prescribed medication for the N/V feeling so she states that made her feel a little better. She states she just would like to follow-up to see is there anything else she can do about the Fatigue.

## 2018-11-07 DIAGNOSIS — F32A Depression, unspecified: Secondary | ICD-10-CM | POA: Insufficient documentation

## 2018-11-07 DIAGNOSIS — G4700 Insomnia, unspecified: Secondary | ICD-10-CM | POA: Insufficient documentation

## 2018-11-07 DIAGNOSIS — R5383 Other fatigue: Secondary | ICD-10-CM | POA: Insufficient documentation

## 2018-11-07 DIAGNOSIS — F329 Major depressive disorder, single episode, unspecified: Secondary | ICD-10-CM | POA: Insufficient documentation

## 2018-11-07 NOTE — Patient Instructions (Signed)
F/u for in clinic for additional evaluation and discussion with Dr. Pamella Pert

## 2018-11-10 ENCOUNTER — Ambulatory Visit: Payer: 59 | Admitting: Family Medicine

## 2018-11-11 ENCOUNTER — Ambulatory Visit: Payer: 59 | Admitting: Family Medicine

## 2018-12-05 ENCOUNTER — Other Ambulatory Visit: Payer: Self-pay | Admitting: Family Medicine

## 2018-12-05 NOTE — Telephone Encounter (Signed)
Forwarding medication refill request to PCP for review. 

## 2018-12-14 ENCOUNTER — Other Ambulatory Visit: Payer: Self-pay | Admitting: Physician Assistant

## 2018-12-14 NOTE — Telephone Encounter (Signed)
Requested Prescriptions  Pending Prescriptions Disp Refills  . atorvastatin (LIPITOR) 20 MG tablet [Pharmacy Med Name: ATORVASTATIN 20 MG TABLET] 90 tablet 0    Sig: TAKE 1 TABLET BY MOUTH EVERY DAY     Cardiovascular:  Antilipid - Statins Failed - 12/14/2018  9:51 AM      Failed - Total Cholesterol in normal range and within 360 days    Cholesterol, Total  Date Value Ref Range Status  01/17/2018 216 (H) 100 - 199 mg/dL Final         Failed - Triglycerides in normal range and within 360 days    Triglycerides  Date Value Ref Range Status  01/17/2018 394 (H) 0 - 149 mg/dL Final         Passed - LDL in normal range and within 360 days    LDL Calculated  Date Value Ref Range Status  01/17/2018 76 0 - 99 mg/dL Final         Passed - HDL in normal range and within 360 days    HDL  Date Value Ref Range Status  01/17/2018 61 >39 mg/dL Final         Passed - Patient is not pregnant      Passed - Valid encounter within last 12 months    Recent Outpatient Visits          5 months ago COPD exacerbation [facility])   Primary Care at [facility], Ranell Patrick, MD   7 months ago Chronic obstructive pulmonary disease with acute exacerbation [facility])   Primary Care at [facility], Lilia Argue, MD   8 months ago Generalized anxiety disorder   Primary Care at [facility], Gelene Mink, PA-C   11 months ago Annual physical exam   Primary Care at [facility], [place], PA-C   11 months ago COPD exacerbation [facility])   Primary Care at [facility], Gelene Mink, PA-C      Future Appointments            In 4 days Rutherford Guys, MD Primary Care at [place], [facility]

## 2018-12-18 ENCOUNTER — Telehealth (INDEPENDENT_AMBULATORY_CARE_PROVIDER_SITE_OTHER): Payer: 59 | Admitting: Family Medicine

## 2018-12-18 ENCOUNTER — Other Ambulatory Visit: Payer: Self-pay

## 2018-12-18 DIAGNOSIS — R0683 Snoring: Secondary | ICD-10-CM | POA: Diagnosis not present

## 2018-12-18 DIAGNOSIS — F411 Generalized anxiety disorder: Secondary | ICD-10-CM | POA: Diagnosis not present

## 2018-12-18 DIAGNOSIS — R040 Epistaxis: Secondary | ICD-10-CM

## 2018-12-18 DIAGNOSIS — R6889 Other general symptoms and signs: Secondary | ICD-10-CM | POA: Diagnosis not present

## 2018-12-18 MED ORDER — PRAZOSIN HCL 1 MG PO CAPS: 1.0000 mg | ORAL_CAPSULE | Freq: Every day | ORAL | 0 refills | Status: DC

## 2018-12-18 MED ORDER — ALPRAZOLAM 1 MG PO TABS: ORAL_TABLET | ORAL | 1 refills | Status: DC

## 2018-12-18 MED ORDER — ONDANSETRON HCL 8 MG PO TABS: 8.0000 mg | ORAL_TABLET | Freq: Three times a day (TID) | ORAL | 0 refills | Status: DC | PRN

## 2018-12-18 MED ORDER — IBUPROFEN 800 MG PO TABS: 800.0000 mg | ORAL_TABLET | Freq: Three times a day (TID) | ORAL | 0 refills | Status: DC | PRN

## 2018-12-18 NOTE — Progress Notes (Signed)
Virtual Visit Note  I connected with patient on 12/18/18 at 105pm by phone and verified that I am speaking with the correct person using two identifiers. Vicki [name] is currently located at home and patient is currently with them during visit. The provider, Rutherford Guys, MD is located in their office at time of visit.  I discussed the limitations, risks, security and privacy concerns of performing an evaluation and management service by telephone and the availability of in person appointments. I also discussed with the patient that there may be a patient responsible charge related to this service. The patient expressed understanding and agreed to proceed.   CC: sleep  HPI ? She has been taking with nurse from her insurance  Requesting referral to sleep study She is having significant issues with her sleep Having vivid dreams, very stressful, has been waking up with bruises, has been hitting her headboard Not reliving actual events Snores, of recent sleeps 12-14 a night and still needs afternoon nap, sometimes wakes up with headaches  Anxiety not well controlled Taking xanax BID as waking up with anxiety Takes citalopram 60mg  a day and trazodone 50mg  at night  Had a significant nose bleed several days ago Lasted 2 hours, difficult to control Has no history of epistaxis, very infrequent NSAIDs use, no recent nasal congestion, etc.  No other abnormal bleeding She has since stopped her ASA  Allergies  Allergen Reactions  . Jasmine Oil Shortness Of Breath and Swelling  . Penicillins Anaphylaxis and Hives  . Aleve [Naproxen Sodium] Itching  . Benadryl [Diphenhydramine] Other (See Comments)    hallucinations  . Ceftin [Cefuroxime Axetil] Other (See Comments)    unsure  . Depo-Provera [Medroxyprogesterone Acetate] Other (See Comments)    Severe acne  . Doxycycline     Raises blood pressure   . Oxycodone Nausea And Vomiting  . Tramadol Other (See Comments)    Kept her  up all night    Prior to Admission medications   Medication Sig Start Date End Date Taking? Authorizing Provider  albuterol (PROVENTIL HFA;VENTOLIN HFA) 108 (90 Base) MCG/ACT inhaler Inhale 2 puffs into the lungs every 4 (four) hours as needed for wheezing or shortness of breath. 07/19/17  Yes McVey, Gelene Mink, PA-C  ALPRAZolam (XANAX) 1 MG tablet TAKE 1/2 TO 1 TABLETS (0.5-1 MG TOTAL) BY MOUTH DAILY AS NEEDED FOR ANXIETY OR SLEEP. 09/17/18  Yes Rutherford Guys, MD  atorvastatin (LIPITOR) 20 MG tablet TAKE 1 TABLET BY MOUTH EVERY DAY 12/14/18  Yes Rutherford Guys, MD  cetirizine (ZYRTEC) 10 MG tablet Take 1 tablet (10 mg total) by mouth daily. 07/19/17  Yes McVey, Gelene Mink, PA-C  citalopram (CELEXA) 20 MG tablet Take 3 tablets (60 mg total) by mouth daily. 03/27/18  Yes McVey, Gelene Mink, PA-C  eletriptan (RELPAX) 40 MG tablet Take 1 tablet (40 mg total) by mouth as needed for migraine or headache. Take at onset of headache. May repeat in 2 hrs if headache persists 09/17/18  Yes Rutherford Guys, MD  Fluticasone-Salmeterol (ADVAIR DISKUS) 500-50 MCG/DOSE AEPB INHALE 1 PUFF INTO THE LUNGS TWICE A DAY 06/18/18  Yes McVey, Gelene Mink, PA-C  glycopyrrolate (ROBINUL) 1 MG tablet TAKE 1 TABLET BY MOUTH EVERY EVENING 10/19/18  Yes Rutherford Guys, MD  ibuprofen (ADVIL) 800 MG tablet TAKE 1 TABLET (800 MG TOTAL) BY MOUTH EVERY 8 (EIGHT) HOURS AS NEEDED FOR MILD PAIN. 12/07/18  Yes Rutherford Guys, MD  ondansetron (ZOFRAN) 8 MG  tablet Take 1 tablet (8 mg total) by mouth every 8 (eight) hours as needed for nausea or vomiting. 11/04/18  Yes Rutherford Guys, MD  rOPINIRole (REQUIP) 1 MG tablet Take 1 tablet (1 mg total) by mouth at bedtime. 06/16/18  Yes Rutherford Guys, MD  tiotropium (SPIRIVA HANDIHALER) 18 MCG inhalation capsule Place 1 capsule (18 mcg total) into inhaler and inhale daily. 01/01/18  Yes McVey, Gelene Mink, PA-C  traZODone (DESYREL) 50 MG tablet Take 0.5-1 tablets  (25-50 mg total) by mouth at bedtime as needed for sleep. 09/18/17  Yes McVey, Gelene Mink, PA-C  traZODone (DESYREL) 50 MG tablet Take 1 tablet (50 mg total) by mouth at bedtime. 06/20/18  Yes Rutherford Guys, MD  umeclidinium bromide (INCRUSE ELLIPTA) 62.5 MCG/INH AEPB Inhale 1 puff into the lungs daily. 09/17/18  Yes Rutherford Guys, MD  ibuprofen (ADVIL,MOTRIN) 800 MG tablet Take 1 tablet (800 mg total) by mouth every 8 (eight) hours as needed for mild pain. 04/22/18   Rutherford Guys, MD    Past Medical History:  Diagnosis Date  . Asthma   . Carpal tunnel syndrome   . COPD (chronic obstructive pulmonary disease) ([place])   . Diverticulitis     Past Surgical History:  Procedure Laterality Date  . ABDOMINAL HYSTERECTOMY  2009  . APPENDECTOMY    . ARTHROSCOPIC REPAIR ACL Left 2011   x 2  . BUNIONECTOMY  2006   both  . CARPAL TUNNEL RELEASE Bilateral 10/28/2013   Procedure: BILATERAL CARPAL TUNNEL RELEASE;  Surgeon: Wynonia Sours, MD;  Location: [facility];  Service: Orthopedics;  Laterality: Bilateral;  . DILATION AND CURETTAGE OF UTERUS    . TUBAL LIGATION  2004    Social History   Tobacco Use  . Smoking status: Current Every Day Smoker    Packs/day: 0.50    Years: 34.00    Pack years: 17.00  . Smokeless tobacco: Never Used  Substance Use Topics  . Alcohol use: Yes    Comment: occ    Family History  Problem Relation Age of Onset  . Cancer Mother   . Cancer Sister 77       breast cancer  . Breast cancer Sister   . Breast cancer Sister     ROS Per hpi  Objective  Vitals as reported by the patient: none   ASSESSMENT and PLAN  1. Generalized anxiety disorder 2. Vivid dream Not well controlled. Discussed treatment options. Patient decided trial of prazosin given dreams. Reviewed r/se/b - ALPRAZolam (XANAX) 1 MG tablet; TAKE 1/2 TO 1 TABLETS (0.5-1 MG TOTAL) BY MOUTH DAILY AS NEEDED FOR ANXIETY OR SLEEP. - TSH; Future   3. Snoring -  Ambulatory referral to Sleep Studies  4. Bloody nose - CBC with Differential/Platelet; Future - Comprehensive metabolic panel; Future - PT AND PTT; Future - Ambulatory referral to ENT  Other orders - ibuprofen (ADVIL) 800 MG tablet; Take 1 tablet (800 mg total) by mouth every 8 (eight) hours as needed for mild pain. - ondansetron (ZOFRAN) 8 MG tablet; Take 1 tablet (8 mg total) by mouth every 8 (eight) hours as needed for nausea or vomiting. - prazosin (MINIPRESS) 1 MG capsule; Take 1-2 capsules (1-2 mg total) by mouth at bedtime.  FOLLOW-UP: 4 weeks   The above assessment and management plan was discussed with the patient. The patient verbalized understanding of and has agreed to the management plan. Patient is aware to call the clinic if symptoms  persist or worsen. Patient is aware when to return to the clinic for a follow-up visit. Patient educated on when it is appropriate to go to the emergency department.    I provided 22 minutes of non-face-to-face time during this encounter.  Rutherford Guys, MD [facility] at [facility], [place] [address] Ph.  [phone] Fax [phone]

## 2018-12-18 NOTE — Progress Notes (Signed)
Pt has to be switch to virtual due to vomiting and joint pain, she has also had a nose bleed this past Tuesday lasting for 2 hours. She is asking for a sleep study to be done and also the nurse from her insurance carrier suggest that she be seen by pulmonary specialist due to sob when walking. She has been tested for covid -neg. Medication refill pending, pharmacy verified

## 2018-12-22 ENCOUNTER — Other Ambulatory Visit: Payer: Self-pay | Admitting: Physician Assistant

## 2018-12-22 ENCOUNTER — Other Ambulatory Visit: Payer: Self-pay

## 2018-12-22 ENCOUNTER — Ambulatory Visit: Payer: 59

## 2018-12-22 DIAGNOSIS — R7989 Other specified abnormal findings of blood chemistry: Secondary | ICD-10-CM

## 2018-12-22 DIAGNOSIS — R040 Epistaxis: Secondary | ICD-10-CM

## 2018-12-22 DIAGNOSIS — R945 Abnormal results of liver function studies: Secondary | ICD-10-CM

## 2018-12-22 DIAGNOSIS — F411 Generalized anxiety disorder: Secondary | ICD-10-CM

## 2018-12-22 DIAGNOSIS — G4700 Insomnia, unspecified: Secondary | ICD-10-CM

## 2018-12-22 NOTE — Telephone Encounter (Signed)
telemed visit 12/18/18

## 2018-12-23 LAB — CBC WITH DIFFERENTIAL/PLATELET
Basophils Absolute: 0 10*3/uL (ref 0.0–0.2)
Basos: 1 %
EOS (ABSOLUTE): 0.1 10*3/uL (ref 0.0–0.4)
Eos: 2 %
Hematocrit: 47.4 % — ABNORMAL HIGH (ref 34.0–46.6)
Hemoglobin: 15.7 g/dL (ref 11.1–15.9)
Immature Grans (Abs): 0 10*3/uL (ref 0.0–0.1)
Immature Granulocytes: 0 %
Lymphocytes Absolute: 2.2 10*3/uL (ref 0.7–3.1)
Lymphs: 24 %
MCH: 30.9 pg (ref 26.6–33.0)
MCHC: 33.1 g/dL (ref 31.5–35.7)
MCV: 93 fL (ref 79–97)
Monocytes Absolute: 0.9 10*3/uL (ref 0.1–0.9)
Monocytes: 10 %
Neutrophils Absolute: 5.6 10*3/uL (ref 1.4–7.0)
Neutrophils: 63 %
Platelets: 227 10*3/uL (ref 150–450)
RBC: 5.08 x10E6/uL (ref 3.77–5.28)
RDW: 12 % (ref 11.7–15.4)
WBC: 8.8 10*3/uL (ref 3.4–10.8)

## 2018-12-23 LAB — PT AND PTT
INR: 1 (ref 0.8–1.2)
Prothrombin Time: 10.6 s (ref 9.1–12.0)
aPTT: 29 s (ref 24–33)

## 2018-12-23 LAB — COMPREHENSIVE METABOLIC PANEL
ALT: 56 IU/L — ABNORMAL HIGH (ref 0–32)
AST: 96 IU/L — ABNORMAL HIGH (ref 0–40)
Albumin/Globulin Ratio: 1.7 (ref 1.2–2.2)
Albumin: 4.5 g/dL (ref 3.8–4.9)
Alkaline Phosphatase: 139 IU/L — ABNORMAL HIGH (ref 39–117)
BUN/Creatinine Ratio: 12 (ref 9–23)
BUN: 10 mg/dL (ref 6–24)
Bilirubin Total: 0.6 mg/dL (ref 0.0–1.2)
CO2: 22 mmol/L (ref 20–29)
Calcium: 9.8 mg/dL (ref 8.7–10.2)
Chloride: 97 mmol/L (ref 96–106)
Creatinine, Ser: 0.84 mg/dL (ref 0.57–1.00)
GFR calc Af Amer: 93 mL/min/{1.73_m2} (ref >59)
GFR calc non Af Amer: 81 mL/min/{1.73_m2} (ref >59)
Globulin, Total: 2.6 g/dL (ref 1.5–4.5)
Glucose: 111 mg/dL — ABNORMAL HIGH (ref 65–99)
Potassium: 4.6 mmol/L (ref 3.5–5.2)
Sodium: 138 mmol/L (ref 134–144)
Total Protein: 7.1 g/dL (ref 6.0–8.5)

## 2018-12-23 LAB — TSH: TSH: 1.93 u[IU]/mL (ref 0.450–4.500)

## 2019-01-06 ENCOUNTER — Ambulatory Visit (INDEPENDENT_AMBULATORY_CARE_PROVIDER_SITE_OTHER): Payer: 59 | Admitting: Neurology

## 2019-01-06 ENCOUNTER — Encounter: Payer: Self-pay | Admitting: Neurology

## 2019-01-06 ENCOUNTER — Other Ambulatory Visit: Payer: Self-pay

## 2019-01-06 VITALS — BP 109/75 | HR 84 | Ht 63.0 in | Wt 159.0 lb

## 2019-01-06 DIAGNOSIS — R6889 Other general symptoms and signs: Secondary | ICD-10-CM

## 2019-01-06 DIAGNOSIS — R0683 Snoring: Secondary | ICD-10-CM

## 2019-01-06 DIAGNOSIS — F321 Major depressive disorder, single episode, moderate: Secondary | ICD-10-CM

## 2019-01-06 DIAGNOSIS — G4723 Circadian rhythm sleep disorder, irregular sleep wake type: Secondary | ICD-10-CM

## 2019-01-06 DIAGNOSIS — R519 Headache, unspecified: Secondary | ICD-10-CM

## 2019-01-06 DIAGNOSIS — G4752 REM sleep behavior disorder: Secondary | ICD-10-CM | POA: Diagnosis not present

## 2019-01-06 DIAGNOSIS — R51 Headache: Secondary | ICD-10-CM

## 2019-01-06 DIAGNOSIS — J449 Chronic obstructive pulmonary disease, unspecified: Secondary | ICD-10-CM

## 2019-01-06 DIAGNOSIS — E663 Overweight: Secondary | ICD-10-CM

## 2019-01-06 DIAGNOSIS — F17200 Nicotine dependence, unspecified, uncomplicated: Secondary | ICD-10-CM

## 2019-01-06 NOTE — Progress Notes (Signed)
Subjective:    Patient ID: Vicki [name] is a 53 y.o. female.  HPI     Star Age, MD, PhD [facility] [address], [address] [place], [place] [address]  Dear Dr. Pamella Pert,   I saw your patient, Vicki [name], upon your kind request, in my sleep clinic today for initial consultation of her sleep disorder, in particular, concern for underlying obstructive sleep apnea.  The patient is accompanied by her husband today.  As you know, Vicki [name] is a 53 year old right-handed woman with an underlying medical history of asthma, COPD, smoking, depression, anxiety, diverticulitis, carpal tunnel syndrome, and overweight state, who reports snoring and excessive daytime somnolence, vivid dreams, acting out of her dreams, and waking up with headaches. I reviewed your virtual visit note from 12/18/2018. Her Epworth sleepiness score is 9 out of 24, fatigue severity score is 55 out of 63.  Per husband, she snores loudly.  He was recently diagnosed with sleep apnea himself and was placed on a BiPAP, he has had great improvement of his sleep-related symptoms and is encouraging his wife to get checked out.  She reports an 39-month history of proximately of very vivid and disturbing dreams and also moving vehemently in her sleep, she has woken up with bruising on her forearms and upper arms.  She has woken up with a headache and with a sense of panic and increase in anxiety.  Her depression is under suboptimal control currently.  She has been on Celexa higher dose at 60 mg for quite some time, no recent medication changes with the exception of a recent additional medication for depression which she took for about 3 days and then stopped it due to sedation.  She cannot think of the name of it and I was trying to look in her chart for the additional depression medication and I could not find it upon chart review.  She is not aware of any family history of sleep apnea.  She has  had recent increase in her stress in the past 4 to 6 weeks.  They have been dealing with issues with 1 of their sons.  They have 3 grown children.  She has increased her alcohol consumption in the recent past to where she drinks about a pint of vodka per day.  She also tries to drink alcohol to help her sleep.  She has not been able to quit smoking.  She smokes about a pack per day.  She has intermittent coughing and increase in COPD symptoms.  She does not see a pulmonologist.  She was referred to ENT for recent prolonged episode of epistaxis.  She does not have a set schedule for her sleep and rise time.  She can go to bed at 6 PM and wake up by 9 PM and then stay awake and sleep randomly throughout the day and night.  She does not have a set rise time.  She currently does not work.  She was working as a Training and development officer for [employer].  She has had nocturnal headaches.  She does not have any night to night nocturia.  She drinks caffeine in the form of soda, 2-3 16 ounce bottles per day on average.  She is tearful during the visit today.  We did not go into details as to her/there is stressors with their son.  Her Past Medical History Is Significant For: Past Medical History:  Diagnosis Date  . Asthma   . Carpal tunnel  syndrome   . COPD (chronic obstructive pulmonary disease) ([place])   . Diverticulitis     Her Past Surgical History Is Significant For: Past Surgical History:  Procedure Laterality Date  . ABDOMINAL HYSTERECTOMY  2009  . APPENDECTOMY    . ARTHROSCOPIC REPAIR ACL Left 2011   x 2  . BUNIONECTOMY  2006   both  . CARPAL TUNNEL RELEASE Bilateral 10/28/2013   Procedure: BILATERAL CARPAL TUNNEL RELEASE;  Surgeon: Wynonia Sours, MD;  Location: [facility];  Service: Orthopedics;  Laterality: Bilateral;  . DILATION AND CURETTAGE OF UTERUS    . TUBAL LIGATION  2004    Her Family History Is Significant For: Family History  Problem Relation Age of Onset  . Cancer Mother    . Cancer Sister 80       breast cancer  . Breast cancer Sister   . Breast cancer Sister     Her Social History Is Significant For: Social History   Socioeconomic History  . Marital status: Married    Spouse name: [name]  . Number of children: 3  . Years of education: 12th grade  . Highest education level: Not on file  Occupational History  . Occupation: Airline pilot: [employer]  Social Needs  . Financial resource strain: Not on file  . Food insecurity    Worry: Not on file    Inability: Not on file  . Transportation needs    Medical: Not on file    Non-medical: Not on file  Tobacco Use  . Smoking status: Current Every Day Smoker    Packs/day: 0.50    Years: 34.00    Pack years: 17.00  . Smokeless tobacco: Never Used  Substance and Sexual Activity  . Alcohol use: Yes    Comment: occ  . Drug use: No  . Sexual activity: Not on file  Lifestyle  . Physical activity    Days per week: Not on file    Minutes per session: Not on file  . Stress: Not on file  Relationships  . Social Herbalist on phone: Not on file    Gets together: Not on file    Attends religious service: Not on file    Active member of club or organization: Not on file    Attends meetings of clubs or organizations: Not on file    Relationship status: Not on file  Other Topics Concern  . Not on file  Social History Narrative   Lives with her husband.   Their 3 children live independently.    Her Allergies Are:  Allergies  Allergen Reactions  . Jasmine Oil Shortness Of Breath and Swelling  . Penicillins Anaphylaxis and Hives  . Aleve [Naproxen Sodium] Itching  . Benadryl [Diphenhydramine] Other (See Comments)    hallucinations  . Ceftin [Cefuroxime Axetil] Other (See Comments)    unsure  . Depo-Provera [Medroxyprogesterone Acetate] Other (See Comments)    Severe acne  . Doxycycline     Raises blood pressure   . Oxycodone Nausea And Vomiting  . Tramadol Other  (See Comments)    Kept her up all night  :   Her Current Medications Are:  Outpatient Encounter Medications as of 01/06/2019  Medication Sig  . albuterol (PROVENTIL HFA;VENTOLIN HFA) 108 (90 Base) MCG/ACT inhaler Inhale 2 puffs into the lungs every 4 (four) hours as needed for wheezing or shortness of breath.  . ALPRAZolam (XANAX) 1 MG tablet TAKE  1/2 TO 1 TABLETS (0.5-1 MG TOTAL) BY MOUTH DAILY AS NEEDED FOR ANXIETY OR SLEEP.  Marland Kitchen atorvastatin (LIPITOR) 20 MG tablet TAKE 1 TABLET BY MOUTH EVERY DAY  . cetirizine (ZYRTEC) 10 MG tablet Take 1 tablet (10 mg total) by mouth daily.  . citalopram (CELEXA) 20 MG tablet Take 3 tablets (60 mg total) by mouth daily.  Marland Kitchen eletriptan (RELPAX) 40 MG tablet Take 1 tablet (40 mg total) by mouth as needed for migraine or headache. Take at onset of headache. May repeat in 2 hrs if headache persists  . Fluticasone-Salmeterol (ADVAIR DISKUS) 500-50 MCG/DOSE AEPB INHALE 1 PUFF INTO THE LUNGS TWICE A DAY  . glycopyrrolate (ROBINUL) 1 MG tablet TAKE 1 TABLET BY MOUTH EVERY EVENING  . ibuprofen (ADVIL) 800 MG tablet Take 1 tablet (800 mg total) by mouth every 8 (eight) hours as needed for mild pain.  Marland Kitchen ondansetron (ZOFRAN) 8 MG tablet Take 1 tablet (8 mg total) by mouth every 8 (eight) hours as needed for nausea or vomiting.  . prazosin (MINIPRESS) 1 MG capsule Take 1-2 capsules (1-2 mg total) by mouth at bedtime.  Marland Kitchen rOPINIRole (REQUIP) 1 MG tablet Take 1 tablet (1 mg total) by mouth at bedtime.  Marland Kitchen tiotropium (SPIRIVA HANDIHALER) 18 MCG inhalation capsule Place 1 capsule (18 mcg total) into inhaler and inhale daily.  . traZODone (DESYREL) 50 MG tablet Take 0.5-1 tablets (25-50 mg total) by mouth at bedtime as needed for sleep.  . traZODone (DESYREL) 50 MG tablet TAKE 1 TABLET BY MOUTH EVERYDAY AT BEDTIME  . umeclidinium bromide (INCRUSE ELLIPTA) 62.5 MCG/INH AEPB Inhale 1 puff into the lungs daily.  . [DISCONTINUED] ibuprofen (ADVIL,MOTRIN) 800 MG tablet Take 1 tablet  (800 mg total) by mouth every 8 (eight) hours as needed for mild pain.   No facility-administered encounter medications on file as of 01/06/2019.   :  Review of Systems:  Out of a complete 14 point review of systems, all are reviewed and negative with the exception of these symptoms as listed below: Review of Systems  Neurological:       Pt presents today to discuss her sleep. Pt has never had a sleep study but does endorse snoring.  Epworth Sleepiness Scale 0= would never doze 1= slight chance of dozing 2= moderate chance of dozing 3= high chance of dozing  Sitting and reading: 1 Watching TV: 2 Sitting inactive in a public place (ex. Theater or meeting): 0 As a passenger in a car for an hour without a break: 1 Lying down to rest in the afternoon: 3 Sitting and talking to someone: 0 Sitting quietly after lunch (no alcohol): 2 In a car, while stopped in traffic: 0 Total: 9     Objective:  Neurological Exam  Physical Exam Physical Examination:   Vitals:   01/06/19 1331  BP: 109/75  Pulse: 84   General Examination: The patient is a very pleasant 53 y.o. female in no acute distress. She appears well-developed and well-nourished and well groomed.   HEENT: Normocephalic, atraumatic, pupils are equal, round and reactive to light and accommodation. Funduscopic exam is normal with sharp disc margins noted. Extraocular tracking is good without limitation to gaze excursion or nystagmus noted. Normal smooth pursuit is noted. Hearing is grossly intact. Tympanic membranes are clear bilaterally. Face is symmetric with normal facial animation and normal facial sensation. Speech is clear with no dysarthria noted. There is no hypophonia. There is no lip, neck/head, jaw or voice tremor. Neck is supple  with full range of passive and active motion. There are no carotid bruits on auscultation. Oropharynx exam reveals: moderate mouth dryness, adequate dental hygiene and moderate airway crowding,  due to Thicker soft palate, larger uvula, tonsils of 1-2+.  Mallampati is class III.  Tongue protrudes centrally in palate elevates symmetrically.  Neck circumference is 15-1/4 inches.Nasal inspection shows no deviated septum, no dried blood, no mucosal bogginess.   Chest: Clear to auscultation without wheezing, rhonchi or crackles noted, but has a few coughing spells.  Heart: S1+S2+0, regular and normal without murmurs, rubs or gallops noted.   Abdomen: Soft, non-tender and non-distended with normal bowel sounds appreciated on auscultation.  Extremities: There is no pitting edema in the distal lower extremities bilaterally. Pedal pulses are intact.  Skin: Warm and dry without trophic changes noted. She had Mild erythema/discoloration of her distal hands and feet.   Musculoskeletal: exam reveals no obvious joint deformities, tenderness or joint swelling or erythema.   Neurologically:  Mental status: The patient is awake, alert and oriented in all 4 spheres. Her immediate and remote memory, attention, language skills and fund of knowledge are appropriate. There is no evidence of aphasia, agnosia, apraxia or anomia. Speech is clear with normal prosody and enunciation. Thought process is linear. Mood is depressed and affect is constricted.  Cranial nerves II - XII are as described above under HEENT exam. In addition: shoulder shrug is normal with equal shoulder height noted. Motor exam: Normal bulk, strength and tone is noted. There is no drift, tremor or rebound. Fine motor skills and coordination: grossly intact.  Cerebellar testing: No dysmetria or intention tremor on finger to nose testing. Heel to shin is unremarkable bilaterally. There is no truncal or gait ataxia.  Sensory exam: intact to light touch.   Assessment and Plan:  In summary, EUPHA SLAVEY is a very pleasant 53 y.o.-year old female with an underlying medical history of asthma, COPD, smoking, depression, anxiety,  diverticulitis, carpal tunnel syndrome, and overweight state, who Presents for evaluation of her sleep disorder including concern for sleep disordered breathing, history of vivid dreams, also episodes of acting out in her dreams in the past 8 months.  She currently has suboptimally controlled depression and anxiety and increased stressors as I understand.  She is strongly advised to seek follow-up with you regarding this.  She is advised that she may benefit from seeing a psychiatrist and/or counselor at this time. I had a long chat with the patient and her husband about my findings and the diagnosis of OSA, its prognosis and treatment options. We talked about medical treatments, surgical interventions and non-pharmacological approaches. I explained in particular the risks and ramifications of untreated moderate to severe OSA, especially with respect to developing cardiovascular disease down the Road, including congestive heart failure, difficult to treat hypertension, cardiac arrhythmias, or stroke. Even type 2 diabetes has, in part, been linked to untreated OSA. Symptoms of untreated OSA include daytime sleepiness, memory problems, mood irritability and mood disorder such as depression and anxiety, lack of energy, as well as recurrent headaches, especially morning headaches. We talked about smoking cessation and trying to maintain a healthy lifestyle in general, as well as the importance of weight control. Furthermore, she is strongly advised to reduce her alcohol intake.  She is discouraged from utilizing alcohol as a sleep aid.  It is in fact a sleep disrupter. I advised the patient not to drive when feeling sleepy. I recommended the following at this time: sleep study.  I explained the sleep test procedure to the patient and also outlined possible surgical and non-surgical treatment options of OSA, including the use of a custom-made dental device (which would require a referral to a specialist dentist or  oral surgeon), upper airway surgical options.  She has had dream enactment episodes.  Sometimes these can be seen in the context of taking an SSRI type medication.  She is on a higher dose of Celexa.   I also explained the CPAP treatment option to the patient, who indicated that she would be willing to try CPAP if the need arises. I explained the importance of being compliant with PAP treatment, not only for insurance purposes but primarily to improve Her symptoms, and for the patient's long term health benefit, including to reduce Her cardiovascular risks. I answered all her questions today and the patient and her husband were in agreement. I plan to see her back after the sleep study is completed and encouraged her to call with any interim questions, concerns, problems or updates.   Thank you very much for allowing me to participate in the care of this nice patient. If I can be of any further assistance to you please do not hesitate to call me at [phone].  Sincerely,   Star Age, MD, PhD

## 2019-01-06 NOTE — Patient Instructions (Addendum)
Thank you for choosing [facility] for your sleep related care! It was nice to meet you today! I appreciate that you entrust me with your sleep related healthcare concerns. I hope, I was able to address at least some of your concerns today, and that I can help you feel reassured and also get better.    Here is what we discussed today and what we came up with as our plan for you:    1. Please talk to Dr. Pamella Pert about your residual depression and stress.  You may benefit from seeing a psychiatrist and a counselor. 2. Please try to quit smoking and reduce your daily alcohol intake. 3. Please try to work on getting a more scheduled sleep time and rise time. 4. Based on your symptoms and your exam I believe you are at risk for obstructive sleep apnea (aka OSA), and I think we should proceed with a sleep study to determine whether you do or do not have OSA and how severe it is. Even, if you have mild OSA, I may want you to consider treatment with CPAP, as treatment of even borderline or mild sleep apnea can result and improvement of symptoms such as sleep disruption, daytime sleepiness, nighttime bathroom breaks, restless leg symptoms, improvement of headache syndromes, even improved mood disorder.   Please remember, the long-term risks and ramifications of untreated moderate to severe obstructive sleep apnea are: increased Cardiovascular disease, including congestive heart failure, stroke, difficult to control hypertension, treatment resistant obesity, arrhythmias, especially irregular heartbeat commonly known as A. Fib. (atrial fibrillation); even type 2 diabetes has been linked to untreated OSA.   Sleep apnea can cause disruption of sleep and sleep deprivation in most cases, which, in turn, can cause recurrent headaches, problems with memory, mood, concentration, focus, and vigilance. Most people with untreated sleep apnea report excessive daytime sleepiness, which can affect their  ability to drive. Please do not drive if you feel sleepy. Patients with sleep apnea developed difficulty initiating and maintaining sleep (aka insomnia).   Having sleep apnea may increase your risk for other sleep disorders, including involuntary behaviors sleep such as sleep terrors, sleep talking, sleepwalking.    Having sleep apnea can also increase your risk for restless leg syndrome and leg movements at night.   Please note that untreated obstructive sleep apnea may carry additional perioperative morbidity. Patients with significant obstructive sleep apnea (typically, in the moderate to severe degree) should receive, if possible, perioperative PAP (positive airway pressure) therapy and the surgeons and particularly the anesthesiologists should be informed of the diagnosis and the severity of the sleep disordered breathing.   I will likely see you back after your sleep study to go over the test results and where to go from there. We will call you after your sleep study to advise about the results (most likely, you will hear from [place], my nurse) and to set up an appointment at the time, as necessary.    Our sleep lab administrative assistant will call you to schedule your sleep study and give you further instructions, regarding the check in process for the sleep study, arrival time, what to bring, when you can expect to leave after the study, etc., and to answer any other logistical questions you may have. If you don't hear back from her by about 2 weeks from now, please feel free to call her direct line at [phone] or you can call our general clinic number, or email [place] through My Chart.

## 2019-01-10 ENCOUNTER — Other Ambulatory Visit: Payer: Self-pay | Admitting: Family Medicine

## 2019-01-15 ENCOUNTER — Other Ambulatory Visit: Payer: Self-pay | Admitting: Physician Assistant

## 2019-01-15 DIAGNOSIS — F329 Major depressive disorder, single episode, unspecified: Secondary | ICD-10-CM

## 2019-01-15 DIAGNOSIS — F32A Depression, unspecified: Secondary | ICD-10-CM

## 2019-01-21 ENCOUNTER — Ambulatory Visit (INDEPENDENT_AMBULATORY_CARE_PROVIDER_SITE_OTHER): Payer: 59 | Admitting: Neurology

## 2019-01-21 ENCOUNTER — Other Ambulatory Visit: Payer: Self-pay

## 2019-01-21 DIAGNOSIS — R0683 Snoring: Secondary | ICD-10-CM

## 2019-01-21 DIAGNOSIS — F17200 Nicotine dependence, unspecified, uncomplicated: Secondary | ICD-10-CM

## 2019-01-21 DIAGNOSIS — G4761 Periodic limb movement disorder: Secondary | ICD-10-CM

## 2019-01-21 DIAGNOSIS — F321 Major depressive disorder, single episode, moderate: Secondary | ICD-10-CM

## 2019-01-21 DIAGNOSIS — R6889 Other general symptoms and signs: Secondary | ICD-10-CM

## 2019-01-21 DIAGNOSIS — R519 Headache, unspecified: Secondary | ICD-10-CM

## 2019-01-21 DIAGNOSIS — G4734 Idiopathic sleep related nonobstructive alveolar hypoventilation: Secondary | ICD-10-CM | POA: Diagnosis not present

## 2019-01-21 DIAGNOSIS — G4723 Circadian rhythm sleep disorder, irregular sleep wake type: Secondary | ICD-10-CM

## 2019-01-21 DIAGNOSIS — G4720 Circadian rhythm sleep disorder, unspecified type: Secondary | ICD-10-CM

## 2019-01-21 DIAGNOSIS — R9431 Abnormal electrocardiogram [ECG] [EKG]: Secondary | ICD-10-CM

## 2019-01-21 DIAGNOSIS — J449 Chronic obstructive pulmonary disease, unspecified: Secondary | ICD-10-CM

## 2019-01-21 DIAGNOSIS — E663 Overweight: Secondary | ICD-10-CM

## 2019-01-21 DIAGNOSIS — G4752 REM sleep behavior disorder: Secondary | ICD-10-CM

## 2019-01-27 ENCOUNTER — Encounter: Payer: 59 | Admitting: Family Medicine

## 2019-01-28 ENCOUNTER — Other Ambulatory Visit: Payer: Self-pay | Admitting: Family Medicine

## 2019-01-28 ENCOUNTER — Telehealth: Payer: Self-pay

## 2019-01-28 DIAGNOSIS — G43909 Migraine, unspecified, not intractable, without status migrainosus: Secondary | ICD-10-CM

## 2019-01-28 NOTE — Progress Notes (Signed)
Patient referred by Dr. Pamella Pert, seen by me on 01/06/19, diagnostic PSG on 01/21/19.   Please call and notify the patient that the recent sleep study did not show any significant obstructive sleep apnea, she had mild snoring. She had lower O2 sats at baseline, even during wakefulness, around mid 80s and O2 was given, up to 2 lpm.  I recommend the following: smoking cessation and FU with PCP to talk about COPD management, smoking cessation, also about seeing a specialist for lung d/s, ie pulmonologist.  She has leg movements during sleep and has been on Requip. She can FU with her prescribing MD if she has RLS symptoms. I can see her in FU if needed.  CPAP therapy is not needed, but she will likely need more work up and optimization of treatment of her lung disease.   Please remind patient to try to maintain good sleep hygiene, which means: Keep a regular sleep and wake schedule and make enough time for sleep (7 1/2 to 8 1/2 hours for the average adult), try not to exercise or have a meal within 2 hours of your bedtime, try to keep your bedroom conducive for sleep, that is, cool and dark, without light distractors such as an illuminated alarm clock, and refrain from watching TV right before sleep or in the middle of the night and do not keep the TV or radio on during the night. If a nightlight is used, have it away from the visual field. Also, try not to use or play on electronic devices at bedtime, such as your cell phone, tablet PC or laptop. If you like to read at bedtime on an electronic device, try to dim the background light as much as possible. Do not eat in the middle of the night. Keep pets away from the bedroom environment. For stress relief, try meditation, deep breathing exercises (there are many books and CDs available), a white noise machine or fan can help to diffuse other noise distractors, such as traffic noise. Do not drink alcohol before bedtime, as it can disturb sleep and cause middle of the  night awakenings. Never mix alcohol and sedating medications! Avoid narcotic pain medication close to bedtime, as opioids/narcotics can suppress breathing drive and breathing effort.    Thanks,  Star Age, MD, PhD [facility])

## 2019-01-28 NOTE — Procedures (Signed)
PATIENT'S NAME:  Vicki, [name] DOB:      Mar 02, 1966      MR#:    [id number]     DATE OF RECORDING: 01/21/2019 REFERRING M.D.:  Grant Fontana MD Study Performed:   Baseline Polysomnogram HISTORY: 53 year old woman with a history of asthma, COPD, smoking, depression, anxiety, diverticulitis, carpal tunnel syndrome, and overweight state, who reports snoring and excessive daytime somnolence, vivid dreams, acting out of her dreams, and waking up with headaches. The patient endorsed the Epworth Sleepiness Scale at 9/24 points. The patient's weight 159 pounds with a height of 63 (inches), resulting in a BMI of 28.1 kg/m2. The patient's neck circumference measured 15.2 inches.  CURRENT MEDICATIONS: Proventil, Xanax, Lipitor, Zyrtec, Celexa, Relpax, Advair, Robinul, Advil, Zofran, Minipress, Requip, Spiriva, Desyrel, Incruse Ellipta.   PROCEDURE:  This is a multichannel digital polysomnogram utilizing the Somnostar 11.2 system.  Electrodes and sensors were applied and monitored per AASM Specifications.   EEG, EOG, Chin and Limb EMG, were sampled at 200 Hz.  ECG, Snore and Nasal Pressure, Thermal Airflow, Respiratory Effort, CPAP Flow and Pressure, Oximetry was sampled at 50 Hz. Digital video and audio were recorded.      BASELINE STUDY  Lights Out was at 00:03 and Lights On at 08:33.  Total recording time (TRT) was 510.5 minutes, with a total sleep time (TST) of 358 minutes.   The patient's sleep latency was 138.5 minutes, which is markedly delayed. REM latency was 333.5 minutes, which is markedly delayed. The sleep efficiency was 70.1%.     SLEEP ARCHITECTURE: WASO (Wake after sleep onset) was 21.5 minutes with overall mild sleep fragmentation noted.  There were 21 minutes in Stage N1, 315.5 minutes Stage N2, 0 minutes Stage N3 and 21.5 minutes in Stage REM.  The percentage of Stage N1 was 5.9%, Stage N2 was 88.1%, which is markedly increased, Stage N3 was absent and Stage R (REM sleep) was 6.%, which is  markedly reduced. The arousals were noted as: 101 were spontaneous, 115 were associated with PLMs, 5 were associated with respiratory events.  RESPIRATORY ANALYSIS:  There were a total of 5 respiratory events:  1 obstructive apneas, 0 central apneas and 0 mixed apneas with a total of 1 apneas and an apnea index (AI) of .2 /hour. There were 4 hypopneas with a hypopnea index of .7 /hour. The patient also had 0 respiratory event related arousals (RERAs).      The total APNEA/HYPOPNEA INDEX (AHI) was .8/hour and the total RESPIRATORY DISTURBANCE INDEX was .8 /hour.  0 events occurred in REM sleep and 8 events in NREM. The REM AHI was 0 /hour, versus a non-REM AHI of .9. The patient spent 55.5 minutes of total sleep time in the supine position and 303 minutes in non-supine.. The supine AHI was 0.0 versus a non-supine AHI of 1.0.  OXYGEN SATURATION & C02:  The Wake baseline 02 saturation was 84%, and therefore the patient was started on supplemental oxygen during wakefulness at 20:47 at 1 lpm via [place]. The O2 was increased to 2 lpm at 20:54, after which her oxygen saturation averaged about 90% for the rest of the night. Time spent below 89% saturation equaled 136 minutes.   PERIODIC LIMB MOVEMENTS: The patient had a total of 377 Periodic Limb Movements.  The Periodic Limb Movement (PLM) index was 63.2 and the PLM Arousal index was 19.3/hour.   Audio and video analysis did not show any abnormal or unusual movements, behaviors, phonations or vocalizations. The patient took  1 bathroom break. Mild snoring was noted. The EKG was in keeping with normal sinus rhythm (NSR) with occasional PVCs noted. Post-study, the patient indicated that sleep was worse than usual.   IMPRESSION: 1. Nocturnal hypoxemia 2. Periodic Limb Movement Disorder (PLMD) 3. Primary Snoring 4. Dysfunctions associated with sleep stages or arousal from sleep 5. Non-specific abnormal EKG  RECOMMENDATIONS: 1. This study does not demonstrate  any significant obstructive or central sleep disordered breathing, except for mild snoring; however, the study shows nocturnal hypoxemia and improvement with O2 supplementation. The patient should be counseled on smoking cessation and will be advised to seek consultation with a pulmonologist to optimize her underlying lung disease. She may need supplemental oxygen based on her lung disease. 2. Severe PLMs (periodic limb movements of sleep) were noted during this study with moderate arousals; clinical correlation is recommended. Medication effect from the antidepressant medication should be considered.  3. The study showed occasional PVCs on single lead EKG; clinical correlation is recommended and consultation with cardiology may be feasible.  4. This study shows sleep fragmentation and abnormal sleep stage percentages; these are nonspecific findings and per se do not signify an intrinsic sleep disorder or a cause for the patient's sleep-related symptoms. Causes include (but are not limited to) the first night effect of the sleep study, circadian rhythm disturbances, medication effect or an underlying mood disorder or medical problem.  5. The patient should be cautioned not to drive, work at heights, or operate dangerous or heavy equipment when tired or sleepy. Review and reiteration of good sleep hygiene measures should be pursued with any patient. 6. The patient will be seen in follow-up in the sleep clinic at [facility] for discussion of the test results, symptom and treatment compliance review, further management strategies, etc. The referring provider will be notified of the test results.  I certify that I have reviewed the entire raw data recording prior to the issuance of this report in accordance with the Standards of Accreditation of the American Academy of Sleep Medicine (AASM)  Star Age, MD, PhD Diplomat, American Board of Neurology and Sleep Medicine (Neurology and Sleep Medicine)

## 2019-01-28 NOTE — Telephone Encounter (Signed)
I called pt to discuss her sleep study results. No answer, left a message asking her to call me back. 

## 2019-01-28 NOTE — Telephone Encounter (Signed)
-----   Message from Star Age, MD sent at 01/28/2019  8:19 AM EDT ----- Patient referred by Dr. Pamella Pert, seen by me on 01/06/19, diagnostic PSG on 01/21/19.   Please call and notify the patient that the recent sleep study did not show any significant obstructive sleep apnea, she had mild snoring. She had lower O2 sats at baseline, even during wakefulness, around mid 80s and O2 was given, up to 2 lpm.  I recommend the following: smoking cessation and FU with PCP to talk about COPD management, smoking cessation, also about seeing a specialist for lung d/s, ie pulmonologist.  She has leg movements during sleep and has been on Requip. She can FU with her prescribing MD if she has RLS symptoms. I can see her in FU if needed.  CPAP therapy is not needed, but she will likely need more work up and optimization of treatment of her lung disease.   Please remind patient to try to maintain good sleep hygiene, which means: Keep a regular sleep and wake schedule and make enough time for sleep (7 1/2 to 8 1/2 hours for the average adult), try not to exercise or have a meal within 2 hours of your bedtime, try to keep your bedroom conducive for sleep, that is, cool and dark, without light distractors such as an illuminated alarm clock, and refrain from watching TV right before sleep or in the middle of the night and do not keep the TV or radio on during the night. If a nightlight is used, have it away from the visual field. Also, try not to use or play on electronic devices at bedtime, such as your cell phone, tablet PC or laptop. If you like to read at bedtime on an electronic device, try to dim the background light as much as possible. Do not eat in the middle of the night. Keep pets away from the bedroom environment. For stress relief, try meditation, deep breathing exercises (there are many books and CDs available), a white noise machine or fan can help to diffuse other noise distractors, such as traffic noise. Do not  drink alcohol before bedtime, as it can disturb sleep and cause middle of the night awakenings. Never mix alcohol and sedating medications! Avoid narcotic pain medication close to bedtime, as opioids/narcotics can suppress breathing drive and breathing effort.    Thanks,  Star Age, MD, PhD [facility])

## 2019-01-28 NOTE — Telephone Encounter (Signed)
Pt returned call. Please call back when available. 

## 2019-01-28 NOTE — Telephone Encounter (Signed)
I called pt. I advised pt that Dr. Rexene Alberts reviewed pt's sleep study and found that pt did not have any significant osa but did have lower O2 sats at baseline. Dr. Rexene Alberts recommends that pt follow up with her PCP about COPD management, smoking cessation, and a pulmonologist referral. Pt had leg movements during her sleep; I recommended that pt follow up with the MD that prescribes requip regarding further symptoms. I reviewed sleep hygiene recommendations with the pt, including trying to keep a regular sleep wake schedule, avoiding electronics in the bedroom, keeping the bedroom cool, dark, and quiet, and avoiding eating or exercising within 2 hours of bedtime as well as eating in the middle of the night. I advised pt to keep pets out of the bedroom. I discussed with pt the importance of stress relief and to try meditation, deep breathing exercises, and/or a white noise machine or fan to diffuse other noise distractors. I advised pt to not drink alcohol before bedtime and to never mix alcohol and sedating medications. Pt was advised to avoid narcotic pain medication close to bedtime. I advised pt that a copy of these sleep study results will be sent to Dr. Pamella Pert. Pt verbalized understanding of results. Pt had no questions at this time but was encouraged to call back if questions arise.

## 2019-01-29 ENCOUNTER — Ambulatory Visit (INDEPENDENT_AMBULATORY_CARE_PROVIDER_SITE_OTHER): Payer: 59 | Admitting: Otolaryngology

## 2019-01-29 DIAGNOSIS — J342 Deviated nasal septum: Secondary | ICD-10-CM

## 2019-01-29 DIAGNOSIS — H6983 Other specified disorders of Eustachian tube, bilateral: Secondary | ICD-10-CM | POA: Diagnosis not present

## 2019-01-29 DIAGNOSIS — J343 Hypertrophy of nasal turbinates: Secondary | ICD-10-CM

## 2019-01-29 DIAGNOSIS — J310 Chronic rhinitis: Secondary | ICD-10-CM

## 2019-02-07 ENCOUNTER — Other Ambulatory Visit: Payer: Self-pay | Admitting: Family Medicine

## 2019-02-19 ENCOUNTER — Other Ambulatory Visit: Payer: Self-pay | Admitting: Family Medicine

## 2019-02-19 DIAGNOSIS — Z1231 Encounter for screening mammogram for malignant neoplasm of breast: Secondary | ICD-10-CM

## 2019-02-24 ENCOUNTER — Encounter: Payer: 59 | Admitting: Family Medicine

## 2019-03-12 ENCOUNTER — Other Ambulatory Visit: Payer: Self-pay | Admitting: Family Medicine

## 2019-03-12 ENCOUNTER — Ambulatory Visit (INDEPENDENT_AMBULATORY_CARE_PROVIDER_SITE_OTHER): Payer: 59 | Admitting: Otolaryngology

## 2019-03-12 ENCOUNTER — Encounter: Payer: Self-pay | Admitting: Family Medicine

## 2019-03-12 ENCOUNTER — Other Ambulatory Visit: Payer: Self-pay

## 2019-03-12 ENCOUNTER — Ambulatory Visit (INDEPENDENT_AMBULATORY_CARE_PROVIDER_SITE_OTHER): Payer: 59 | Admitting: Family Medicine

## 2019-03-12 VITALS — BP 135/90 | HR 94 | Temp 99.0°F | Ht 63.0 in | Wt 160.4 lb

## 2019-03-12 DIAGNOSIS — F329 Major depressive disorder, single episode, unspecified: Secondary | ICD-10-CM

## 2019-03-12 DIAGNOSIS — N959 Unspecified menopausal and perimenopausal disorder: Secondary | ICD-10-CM | POA: Diagnosis not present

## 2019-03-12 DIAGNOSIS — R945 Abnormal results of liver function studies: Secondary | ICD-10-CM

## 2019-03-12 DIAGNOSIS — F32A Depression, unspecified: Secondary | ICD-10-CM

## 2019-03-12 DIAGNOSIS — Z23 Encounter for immunization: Secondary | ICD-10-CM

## 2019-03-12 DIAGNOSIS — G4734 Idiopathic sleep related nonobstructive alveolar hypoventilation: Secondary | ICD-10-CM

## 2019-03-12 DIAGNOSIS — J449 Chronic obstructive pulmonary disease, unspecified: Secondary | ICD-10-CM

## 2019-03-12 DIAGNOSIS — R7989 Other specified abnormal findings of blood chemistry: Secondary | ICD-10-CM

## 2019-03-12 DIAGNOSIS — F411 Generalized anxiety disorder: Secondary | ICD-10-CM

## 2019-03-12 DIAGNOSIS — Z0000 Encounter for general adult medical examination without abnormal findings: Secondary | ICD-10-CM

## 2019-03-12 DIAGNOSIS — G43909 Migraine, unspecified, not intractable, without status migrainosus: Secondary | ICD-10-CM

## 2019-03-12 DIAGNOSIS — Z1211 Encounter for screening for malignant neoplasm of colon: Secondary | ICD-10-CM | POA: Diagnosis not present

## 2019-03-12 DIAGNOSIS — Z0001 Encounter for general adult medical examination with abnormal findings: Secondary | ICD-10-CM

## 2019-03-12 MED ORDER — ALBUTEROL SULFATE HFA 108 (90 BASE) MCG/ACT IN AERS: 2.0000 | INHALATION_SPRAY | RESPIRATORY_TRACT | 3 refills | Status: DC | PRN

## 2019-03-12 MED ORDER — ELETRIPTAN HYDROBROMIDE 40 MG PO TABS: ORAL_TABLET | ORAL | 5 refills | Status: DC

## 2019-03-12 MED ORDER — GABAPENTIN 300 MG PO CAPS: 300.0000 mg | ORAL_CAPSULE | Freq: Every day | ORAL | 3 refills | Status: DC

## 2019-03-12 MED ORDER — CITALOPRAM HYDROBROMIDE 20 MG PO TABS: 60.0000 mg | ORAL_TABLET | Freq: Every day | ORAL | 2 refills | Status: DC

## 2019-03-12 MED ORDER — ALPRAZOLAM 1 MG PO TABS: ORAL_TABLET | ORAL | 1 refills | Status: DC

## 2019-03-12 NOTE — Progress Notes (Signed)
10/29/202011:17 AM  Vicki [name] 1965/05/25, 53 y.o., female [id number]  Chief Complaint  Patient presents with  . Annual Exam    HPI:   Patient is a 53 y.o. female with past medical history significant for COPD, HTN, HLP, anxiety, diverticulitis, RLS who presents today for CPE and routine followup  Last OV Aug 2020 - telemedicine Had sleep study - no OSA, did have RLS and hypoxia Trial of prazosin for vivid dreams -did not tolerate made her very sick Decreased xanax - takes prn, pmp reviewed Anxiety and depression worse - having sign stressors with her son PHQ9=13, no SI GAD 7= 15 Doing counseling thru church  Last CPE Sept 2019 Cervical Cancer Screening: h/o hyst, having worsening of hot flashes, really intense at night, soaking sheets Breast Cancer Screening: mammo scheduled for nov 25, sister had breast cancer Colorectal Cancer Screening: has never done Bone Density Testing: at age 47 Seasonal Influenza Vaccination: had this season Td/Tdap Vaccination: 2012 Pneumococcal Vaccination: recommend 23 given COPD Zoster Vaccination: today Frequency of Dental evaluation: Q6 months Frequency of Eye evaluation: yearly   Hearing Screening   125Hz  250Hz  500Hz  1000Hz  2000Hz  3000Hz  4000Hz  6000Hz  8000Hz   Right ear:           Left ear:             Visual Acuity Screening   Right eye Left eye Both eyes  Without correction: 20/20 20/30 20/30   With correction:       Lab Results  Component Value Date   CREATININE 0.84 12/22/2018   BUN 10 12/22/2018   NA 138 12/22/2018   K 4.6 12/22/2018   CL 97 12/22/2018   CO2 22 12/22/2018   Lab Results  Component Value Date   ALT 56 (H) 12/22/2018   AST 96 (H) 12/22/2018   ALKPHOS 139 (H) 12/22/2018   BILITOT 0.6 12/22/2018   Lab Results  Component Value Date   CHOL 216 (H) 01/17/2018   HDL 61 01/17/2018   LDLCALC 76 01/17/2018   TRIG 394 (H) 01/17/2018   CHOLHDL 3.5 01/17/2018    Depression screen PHQ 2/9 11/06/2018  10/30/2018 07/01/2018  Decreased Interest 1 1 0  Down, Depressed, Hopeless 1 1 0  PHQ - 2 Score 2 2 0  Altered sleeping - 1 -  Tired, decreased energy - 0 -  Change in appetite - 0 -  Feeling bad or failure about yourself  - 0 -  Trouble concentrating - 2 -  Moving slowly or fidgety/restless - 1 -  Suicidal thoughts - 0 -  PHQ-9 Score - 6 -  Difficult doing work/chores - Not difficult at all -    Fall Risk  03/12/2019 12/18/2018 11/06/2018 10/30/2018 07/01/2018  Falls in the past year? 0 0 0 0 0  Number falls in past yr: 0 0 - 0 0  Injury with Fall? 0 0 0 0 0  Follow up - - - - Falls evaluation completed     Allergies  Allergen Reactions  . Jasmine Oil Shortness Of Breath and Swelling  . Penicillins Anaphylaxis and Hives  . Aleve [Naproxen Sodium] Itching  . Benadryl [Diphenhydramine] Other (See Comments)    hallucinations  . Ceftin [Cefuroxime Axetil] Other (See Comments)    unsure  . Depo-Provera [Medroxyprogesterone Acetate] Other (See Comments)    Severe acne  . Doxycycline     Raises blood pressure   . Oxycodone Nausea And Vomiting  . Tramadol Other (See Comments)  Kept her up all night    Prior to Admission medications   Medication Sig Start Date End Date Taking? Authorizing Provider  albuterol (PROVENTIL HFA;VENTOLIN HFA) 108 (90 Base) MCG/ACT inhaler Inhale 2 puffs into the lungs every 4 (four) hours as needed for wheezing or shortness of breath. 07/19/17  Yes McVey, Gelene Mink, PA-C  ALPRAZolam (XANAX) 1 MG tablet TAKE 1/2 TO 1 TABLETS (0.5-1 MG TOTAL) BY MOUTH DAILY AS NEEDED FOR ANXIETY OR SLEEP. 12/18/18  Yes Rutherford Guys, MD  atorvastatin (LIPITOR) 20 MG tablet TAKE 1 TABLET BY MOUTH EVERY DAY 12/14/18  Yes Rutherford Guys, MD  cetirizine (ZYRTEC) 10 MG tablet Take 1 tablet (10 mg total) by mouth daily. 07/19/17  Yes McVey, Gelene Mink, PA-C  citalopram (CELEXA) 20 MG tablet TAKE 3 TABLETS BY MOUTH EVERY DAY 01/15/19  Yes McVey, Gelene Mink, PA-C   eletriptan (RELPAX) 40 MG tablet TAKE 1 TAB BY MOUTH AT ONSET OF MIGRAINE OR HEADACHE AS NEEDED. MAY REPEAT IN 2 HOURS IF PERSISTS 01/28/19  Yes Rutherford Guys, MD  Fluticasone-Salmeterol (ADVAIR DISKUS) 500-50 MCG/DOSE AEPB INHALE 1 PUFF INTO THE LUNGS TWICE A DAY 06/18/18  Yes McVey, Gelene Mink, PA-C  glycopyrrolate (ROBINUL) 1 MG tablet TAKE 1 TABLET BY MOUTH EVERY EVENING 10/19/18  Yes Rutherford Guys, MD  ibuprofen (ADVIL) 800 MG tablet Take 1 tablet (800 mg total) by mouth every 8 (eight) hours as needed for mild pain. 12/18/18  Yes Rutherford Guys, MD  ondansetron (ZOFRAN) 8 MG tablet Take 1 tablet (8 mg total) by mouth every 8 (eight) hours as needed for nausea or vomiting. 12/18/18  Yes Rutherford Guys, MD  rOPINIRole (REQUIP) 1 MG tablet Take 1 tablet (1 mg total) by mouth at bedtime. 06/16/18  Yes Rutherford Guys, MD  tiotropium (SPIRIVA HANDIHALER) 18 MCG inhalation capsule Place 1 capsule (18 mcg total) into inhaler and inhale daily. 01/01/18  Yes McVey, Gelene Mink, PA-C  traZODone (DESYREL) 50 MG tablet Take 0.5-1 tablets (25-50 mg total) by mouth at bedtime as needed for sleep. 09/18/17  Yes McVey, Gelene Mink, PA-C  traZODone (DESYREL) 50 MG tablet TAKE 1 TABLET BY MOUTH EVERYDAY AT BEDTIME 12/22/18  Yes Rutherford Guys, MD  umeclidinium bromide (INCRUSE ELLIPTA) 62.5 MCG/INH AEPB Inhale 1 puff into the lungs daily. 09/17/18  Yes Rutherford Guys, MD  prazosin (MINIPRESS) 1 MG capsule TAKE 1 TO 2 CAPSULES (1 TO 2 MG TOTAL) BY MOUTH AT BEDTIME. Patient not taking: Reported on 03/12/2019 02/07/19   Rutherford Guys, MD  ibuprofen (ADVIL,MOTRIN) 800 MG tablet Take 1 tablet (800 mg total) by mouth every 8 (eight) hours as needed for mild pain. 04/22/18   Rutherford Guys, MD    Past Medical History:  Diagnosis Date  . Asthma   . Carpal tunnel syndrome   . COPD (chronic obstructive pulmonary disease) ([place])   . Diverticulitis     Past Surgical History:  Procedure  Laterality Date  . ABDOMINAL HYSTERECTOMY  2009  . APPENDECTOMY    . ARTHROSCOPIC REPAIR ACL Left 2011   x 2  . BUNIONECTOMY  2006   both  . CARPAL TUNNEL RELEASE Bilateral 10/28/2013   Procedure: BILATERAL CARPAL TUNNEL RELEASE;  Surgeon: Wynonia Sours, MD;  Location: [facility];  Service: Orthopedics;  Laterality: Bilateral;  . DILATION AND CURETTAGE OF UTERUS    . TUBAL LIGATION  2004    Social History   Tobacco Use  .  Smoking status: Current Every Day Smoker    Packs/day: 0.50    Years: 34.00    Pack years: 17.00  . Smokeless tobacco: Never Used  Substance Use Topics  . Alcohol use: Yes    Comment: occ    Family History  Problem Relation Age of Onset  . Cancer Mother   . Cancer Sister 73       breast cancer  . Breast cancer Sister   . Breast cancer Sister     Review of Systems  Constitutional: Positive for diaphoresis. Negative for chills and fever.  Respiratory: Negative for cough and shortness of breath.   Cardiovascular: Negative for chest pain, palpitations and leg swelling.  Gastrointestinal: Positive for vomiting. Negative for abdominal pain and nausea.  per hpi   OBJECTIVE:  Today's Vitals   03/12/19 1108  BP: 135/90  Pulse: 94  Temp: 99 F (37.2 C)  SpO2: 90%  Weight: 160 lb 6.4 oz (72.8 kg)  Height: 5\' 3"  (1.6 m)   Body mass index is 28.41 kg/m.  Wt Readings from Last 3 Encounters:  03/12/19 160 lb 6.4 oz (72.8 kg)  01/06/19 159 lb (72.1 kg)  07/01/18 164 lb 9.6 oz (74.7 kg)    Physical Exam Vitals signs and nursing note reviewed. Exam conducted with a chaperone present.  Constitutional:      Appearance: She is well-developed.  HENT:     Head: Normocephalic and atraumatic.     Right Ear: Hearing, tympanic membrane, ear canal and external ear normal.     Left Ear: Hearing, tympanic membrane, ear canal and external ear normal.     Mouth/Throat:     Mouth: Mucous membranes are moist.     Pharynx: No oropharyngeal exudate  or posterior oropharyngeal erythema.  Eyes:     Extraocular Movements: Extraocular movements intact.     Conjunctiva/sclera: Conjunctivae normal.     Pupils: Pupils are equal, round, and reactive to light.  Neck:     Musculoskeletal: Neck supple.     Thyroid: No thyromegaly.  Cardiovascular:     Rate and Rhythm: Normal rate and regular rhythm.     Heart sounds: Normal heart sounds. No murmur. No friction rub. No gallop.   Pulmonary:     Effort: Pulmonary effort is normal.     Breath sounds: Normal breath sounds. No wheezing, rhonchi or rales.  Chest:     Breasts:        Right: No mass, nipple discharge or skin change.        Left: No mass, nipple discharge or skin change.  Abdominal:     General: Bowel sounds are normal. There is no distension.     Palpations: Abdomen is soft. There is no hepatomegaly, splenomegaly or mass.     Tenderness: There is no abdominal tenderness.  Musculoskeletal: Normal range of motion.     Right lower leg: No edema.     Left lower leg: No edema.  Lymphadenopathy:     Cervical: No cervical adenopathy.     Upper Body:     Right upper body: No supraclavicular, axillary or pectoral adenopathy.     Left upper body: No supraclavicular, axillary or pectoral adenopathy.  Skin:    General: Skin is warm and dry.  Neurological:     Mental Status: She is alert and oriented to person, place, and time.     Cranial Nerves: No cranial nerve deficit.     Gait: Gait normal.  Deep Tendon Reflexes: Reflexes are normal and symmetric.  Psychiatric:        Mood and Affect: Mood normal.        Behavior: Behavior normal.     No results found for this or any previous visit (from the past 24 hour(s)).  No results found.   ASSESSMENT and PLAN  1. Annual physical exam HCM reviewed/discussed. Anticipatory guidance regarding healthy weight, lifestyle and choices given.   2. Need for vaccination - Varicella-zoster vaccine IM  3. Special screening for malignant  neoplasms, colon - Cologuard  4. Generalized anxiety disorder Not controlled in setting of sign family stressors - ALPRAZolam (XANAX) 1 MG tablet; TAKE 1/2 TO 1 TABLETS (0.5-1 MG TOTAL) BY MOUTH DAILY AS NEEDED FOR ANXIETY OR SLEEP. - ToxASSURE Select 13 (MW), Urine  5. Postmenopausal symptoms Trial of gabapentin at bedtime, reviewed r/se/b  6. Depression, unspecified depression type - citalopram (CELEXA) 20 MG tablet; Take 3 tablets (60 mg total) by mouth daily.  7. Chronic obstructive pulmonary disease, unspecified COPD type ([place]) - Ambulatory referral to Pulmonology - albuterol (VENTOLIN HFA) 108 (90 Base) MCG/ACT inhaler; Inhale 2 puffs into the lungs every 4 (four) hours as needed for wheezing or shortness of breath.  8. Nocturnal hypoxia Discussed concerns for xanax. Patient aware of risks, stressors at home too intense. Referring to pulm to further eval and treat need for use of nighttime oxygen - Ambulatory referral to Pulmonology  9. Migraine without status migrainosus, not intractable, unspecified migraine type - eletriptan (RELPAX) 40 MG tablet; TAKE 1 TAB BY MOUTH AT ONSET OF MIGRAINE OR HEADACHE AS NEEDED. MAY REPEAT IN 2 HOURS IF PERSISTS  10. Abnormal liver function test - Ferritin - ANA w/Reflex if Positive - Hepatitis B surface antigen - Hepatitis C antibody  Other orders - gabapentin (NEURONTIN) 300 MG capsule; Take 1 capsule (300 mg total) by mouth at bedtime. - Pneumococcal polysaccharide vaccine 23-valent greater than or equal to 2yo subcutaneous/IM  Return in about 3 months (around 06/12/2019).    Rutherford Guys, MD [facility] [place], [place] [address] Ph.  [phone] Fax [phone]

## 2019-03-12 NOTE — Telephone Encounter (Signed)
Forwarding medication refill request to the clinical pool for review,

## 2019-03-12 NOTE — Patient Instructions (Addendum)
   If you have lab work done today you will be contacted with your lab results within the next 2 weeks.  If you have not heard from us then please contact us. The fastest way to get your results is to register for My Chart.   IF you received an x-ray today, you will receive an invoice from [facility]. Please contact [facility] at [phone] with questions or concerns regarding your invoice.   IF you received labwork today, you will receive an invoice from [facility]. Please contact [facility] at [phone] with questions or concerns regarding your invoice.   Our billing staff will not be able to assist you with questions regarding bills from these companies.  You will be contacted with the lab results as soon as they are available. The fastest way to get your results is to activate your My Chart account. Instructions are located on the last page of this paperwork. If you have not heard from us regarding the results in 2 weeks, please contact this office.     Preventive Care 40-64 Years Old, Female Preventive care refers to visits with your health care provider and lifestyle choices that can promote health and wellness. This includes:  A yearly physical exam. This may also be called an annual well check.  Regular dental visits and eye exams.  Immunizations.  Screening for certain conditions.  Healthy lifestyle choices, such as eating a healthy diet, getting regular exercise, not using drugs or products that contain nicotine and tobacco, and limiting alcohol use. What can I expect for my preventive care visit? Physical exam Your health care provider will check your:  Height and weight. This may be used to calculate body mass index (BMI), which tells if you are at a healthy weight.  Heart rate and blood pressure.  Skin for abnormal spots. Counseling Your health care provider may ask you questions about your:  Alcohol, tobacco, and drug use.  Emotional  well-being.  Home and relationship well-being.  Sexual activity.  Eating habits.  Work and work environment.  Method of birth control.  Menstrual cycle.  Pregnancy history. What immunizations do I need?  Influenza (flu) vaccine  This is recommended every year. Tetanus, diphtheria, and pertussis (Tdap) vaccine  You may need a Td booster every 10 years. Varicella (chickenpox) vaccine  You may need this if you have not been vaccinated. Zoster (shingles) vaccine  You may need this after age 60. Measles, mumps, and rubella (MMR) vaccine  You may need at least one dose of MMR if you were born in 1957 or later. You may also need a second dose. Pneumococcal conjugate (PCV13) vaccine  You may need this if you have certain conditions and were not previously vaccinated. Pneumococcal polysaccharide (PPSV23) vaccine  You may need one or two doses if you smoke cigarettes or if you have certain conditions. Meningococcal conjugate (MenACWY) vaccine  You may need this if you have certain conditions. Hepatitis A vaccine  You may need this if you have certain conditions or if you travel or work in places where you may be exposed to hepatitis A. Hepatitis B vaccine  You may need this if you have certain conditions or if you travel or work in places where you may be exposed to hepatitis B. Haemophilus influenzae type b (Hib) vaccine  You may need this if you have certain conditions. Human papillomavirus (HPV) vaccine  If recommended by your health care provider, you may need three doses over 6 months.   You may receive vaccines as individual doses or as more than one vaccine together in one shot (combination vaccines). Talk with your health care provider about the risks and benefits of combination vaccines. What tests do I need? Blood tests  Lipid and cholesterol levels. These may be checked every 5 years, or more frequently if you are over 50 years old.  Hepatitis C  test.  Hepatitis B test. Screening  Lung cancer screening. You may have this screening every year starting at age 55 if you have a 30-pack-year history of smoking and currently smoke or have quit within the past 15 years.  Colorectal cancer screening. All adults should have this screening starting at age 50 and continuing until age 75. Your health care provider may recommend screening at age 45 if you are at increased risk. You will have tests every 1-10 years, depending on your results and the type of screening test.  Diabetes screening. This is done by checking your blood sugar (glucose) after you have not eaten for a while (fasting). You may have this done every 1-3 years.  Mammogram. This may be done every 1-2 years. Talk with your health care provider about when you should start having regular mammograms. This may depend on whether you have a family history of breast cancer.  BRCA-related cancer screening. This may be done if you have a family history of breast, ovarian, tubal, or peritoneal cancers.  Pelvic exam and Pap test. This may be done every 3 years starting at age 21. Starting at age 30, this may be done every 5 years if you have a Pap test in combination with an HPV test. Other tests  Sexually transmitted disease (STD) testing.  Bone density scan. This is done to screen for osteoporosis. You may have this scan if you are at high risk for osteoporosis. Follow these instructions at home: Eating and drinking  Eat a diet that includes fresh fruits and vegetables, whole grains, lean protein, and low-fat dairy.  Take vitamin and mineral supplements as recommended by your health care provider.  Do not drink alcohol if: ? Your health care provider tells you not to drink. ? You are pregnant, may be pregnant, or are planning to become pregnant.  If you drink alcohol: ? Limit how much you have to 0-1 drink a day. ? Be aware of how much alcohol is in your drink. In the [place] one  drink equals one 12 oz bottle of beer (355 mL), one 5 oz glass of wine (148 mL), or one 1 oz glass of hard liquor (44 mL). Lifestyle  Take daily care of your teeth and gums.  Stay active. Exercise for at least 30 minutes on 5 or more days each week.  Do not use any products that contain nicotine or tobacco, such as cigarettes, e-cigarettes, and chewing tobacco. If you need help quitting, ask your health care provider.  If you are sexually active, practice safe sex. Use a condom or other form of birth control (contraception) in order to prevent pregnancy and STIs (sexually transmitted infections).  If told by your health care provider, take low-dose aspirin daily starting at age 50. What's next?  Visit your health care provider once a year for a well check visit.  Ask your health care provider how often you should have your eyes and teeth checked.  Stay up to date on all vaccines. This information is not intended to replace advice given to you by your health care provider. Make sure   you discuss any questions you have with your health care provider. Document Released: 05/27/2015 Document Revised: 01/09/2018 Document Reviewed: 01/09/2018 Elsevier Patient Education  2020 [employer]  

## 2019-03-13 LAB — HEPATITIS C ANTIBODY: Hep C Virus Ab: <0.1 s/co ratio (ref 0.0–0.9)

## 2019-03-13 LAB — HEPATITIS B SURFACE ANTIGEN: Hepatitis B Surface Ag: NEGATIVE

## 2019-03-13 LAB — ANA W/REFLEX IF POSITIVE: Anti Nuclear Antibody (ANA): NEGATIVE

## 2019-03-13 LAB — FERRITIN: Ferritin: 760 ng/mL — ABNORMAL HIGH (ref 15–150)

## 2019-03-13 NOTE — Addendum Note (Signed)
Addended by: Rutherford Guys on: 03/13/2019 01:21 PM   Modules accepted: Orders

## 2019-03-16 ENCOUNTER — Emergency Department (HOSPITAL_COMMUNITY): Payer: 59

## 2019-03-16 ENCOUNTER — Ambulatory Visit: Payer: Self-pay | Admitting: *Deleted

## 2019-03-16 ENCOUNTER — Encounter (HOSPITAL_COMMUNITY): Payer: Self-pay | Admitting: Emergency Medicine

## 2019-03-16 ENCOUNTER — Emergency Department (HOSPITAL_BASED_OUTPATIENT_CLINIC_OR_DEPARTMENT_OTHER): Payer: 59

## 2019-03-16 ENCOUNTER — Encounter: Payer: Self-pay | Admitting: *Deleted

## 2019-03-16 ENCOUNTER — Telehealth: Payer: Self-pay | Admitting: Family Medicine

## 2019-03-16 ENCOUNTER — Emergency Department (HOSPITAL_COMMUNITY)
Admission: EM | Admit: 2019-03-16 | Discharge: 2019-03-16 | Disposition: A | Discharge status: Home / Self Care | Payer: 59 | Attending: Emergency Medicine | Admitting: Emergency Medicine

## 2019-03-16 DIAGNOSIS — J449 Chronic obstructive pulmonary disease, unspecified: Secondary | ICD-10-CM | POA: Insufficient documentation

## 2019-03-16 DIAGNOSIS — Z79899 Other long term (current) drug therapy: Secondary | ICD-10-CM | POA: Diagnosis not present

## 2019-03-16 DIAGNOSIS — I82402 Acute embolism and thrombosis of unspecified deep veins of left lower extremity: Secondary | ICD-10-CM | POA: Diagnosis not present

## 2019-03-16 DIAGNOSIS — T50Z95A Adverse effect of other vaccines and biological substances, initial encounter: Secondary | ICD-10-CM

## 2019-03-16 DIAGNOSIS — F17200 Nicotine dependence, unspecified, uncomplicated: Secondary | ICD-10-CM | POA: Diagnosis not present

## 2019-03-16 DIAGNOSIS — M79602 Pain in left arm: Secondary | ICD-10-CM | POA: Diagnosis not present

## 2019-03-16 DIAGNOSIS — J45909 Unspecified asthma, uncomplicated: Secondary | ICD-10-CM | POA: Diagnosis not present

## 2019-03-16 LAB — DIFFERENTIAL
Abs Immature Granulocytes: 0.02 10*3/uL (ref 0.00–0.07)
Basophils Absolute: 0.1 10*3/uL (ref 0.0–0.1)
Basophils Relative: 1 %
Eosinophils Absolute: 0.2 10*3/uL (ref 0.0–0.5)
Eosinophils Relative: 4 %
Immature Granulocytes: 0 %
Lymphocytes Relative: 43 %
Lymphs Abs: 2.8 10*3/uL (ref 0.7–4.0)
Monocytes Absolute: 0.7 10*3/uL (ref 0.1–1.0)
Monocytes Relative: 11 %
Neutro Abs: 2.6 10*3/uL (ref 1.7–7.7)
Neutrophils Relative %: 41 %

## 2019-03-16 LAB — CBC
HCT: 43.3 % (ref 36.0–46.0)
Hemoglobin: 14.4 g/dL (ref 12.0–15.0)
MCH: 31.6 pg (ref 26.0–34.0)
MCHC: 33.3 g/dL (ref 30.0–36.0)
MCV: 95 fL (ref 80.0–100.0)
Platelets: 301 10*3/uL (ref 150–400)
RBC: 4.56 MIL/uL (ref 3.87–5.11)
RDW: 12.4 % (ref 11.5–15.5)
WBC: 6.3 10*3/uL (ref 4.0–10.5)
nRBC: 0 % (ref 0.0–0.2)

## 2019-03-16 LAB — URINALYSIS, ROUTINE W REFLEX MICROSCOPIC
Bilirubin Urine: NEGATIVE
Glucose, UA: NEGATIVE mg/dL
Hgb urine dipstick: NEGATIVE
Ketones, ur: NEGATIVE mg/dL
Leukocytes,Ua: NEGATIVE
Nitrite: NEGATIVE
Protein, ur: NEGATIVE mg/dL
Specific Gravity, Urine: 1.009 (ref 1.005–1.030)
pH: 8 (ref 5.0–8.0)

## 2019-03-16 LAB — COMPREHENSIVE METABOLIC PANEL
ALT: 52 U/L — ABNORMAL HIGH (ref 0–44)
AST: 78 U/L — ABNORMAL HIGH (ref 15–41)
Albumin: 3.5 g/dL (ref 3.5–5.0)
Alkaline Phosphatase: 117 U/L (ref 38–126)
Anion gap: 14 (ref 5–15)
BUN: 8 mg/dL (ref 6–20)
CO2: 24 mmol/L (ref 22–32)
Calcium: 8.3 mg/dL — ABNORMAL LOW (ref 8.9–10.3)
Chloride: 100 mmol/L (ref 98–111)
Creatinine, Ser: 0.72 mg/dL (ref 0.44–1.00)
GFR calc Af Amer: >60 mL/min (ref >60)
GFR calc non Af Amer: >60 mL/min (ref >60)
Glucose, Bld: 94 mg/dL (ref 70–99)
Potassium: 3.8 mmol/L (ref 3.5–5.1)
Sodium: 138 mmol/L (ref 135–145)
Total Bilirubin: 0.3 mg/dL (ref 0.3–1.2)
Total Protein: 6.5 g/dL (ref 6.5–8.1)

## 2019-03-16 LAB — I-STAT BETA HCG BLOOD, ED (MC, WL, AP ONLY): I-stat hCG, quantitative: <5 m[IU]/mL (ref <5)

## 2019-03-16 LAB — RAPID URINE DRUG SCREEN, HOSP PERFORMED
Amphetamines: NOT DETECTED
Barbiturates: NOT DETECTED
Benzodiazepines: NOT DETECTED
Cocaine: NOT DETECTED
Opiates: NOT DETECTED
Tetrahydrocannabinol: POSITIVE — AB

## 2019-03-16 LAB — APTT: aPTT: 30 seconds (ref 24–36)

## 2019-03-16 LAB — TOXASSURE SELECT 13 (MW), URINE

## 2019-03-16 LAB — ETHANOL: Alcohol, Ethyl (B): 231 mg/dL — ABNORMAL HIGH (ref <10)

## 2019-03-16 LAB — PROTIME-INR
INR: 1 (ref 0.8–1.2)
Prothrombin Time: 13.2 seconds (ref 11.4–15.2)

## 2019-03-16 MED ORDER — SULFAMETHOXAZOLE-TRIMETHOPRIM 800-160 MG PO TABS: 1.0000 | ORAL_TABLET | Freq: Two times a day (BID) | ORAL | 0 refills | Status: AC

## 2019-03-16 MED ORDER — ONDANSETRON HCL 4 MG/2ML IJ SOLN
4.0000 mg | Freq: Once | INTRAMUSCULAR | Status: AC
  Administered 2019-03-16: 4 mg via INTRAVENOUS
  Filled 2019-03-16: qty 2

## 2019-03-16 MED ORDER — LORAZEPAM 2 MG/ML IJ SOLN
1.0000 mg | Freq: Once | INTRAMUSCULAR | Status: AC
  Administered 2019-03-16: 16:00:00 1 mg via INTRAVENOUS
  Filled 2019-03-16: qty 1

## 2019-03-16 MED ORDER — DEXAMETHASONE SODIUM PHOSPHATE 10 MG/ML IJ SOLN
10.0000 mg | Freq: Once | INTRAMUSCULAR | Status: AC
  Administered 2019-03-16: 10 mg via INTRAVENOUS
  Filled 2019-03-16: qty 1

## 2019-03-16 MED ORDER — HYDROMORPHONE HCL 1 MG/ML IJ SOLN
0.5000 mg | Freq: Once | INTRAMUSCULAR | Status: AC
  Administered 2019-03-16: 16:00:00 0.5 mg via INTRAVENOUS
  Filled 2019-03-16: qty 1

## 2019-03-16 NOTE — ED Provider Notes (Addendum)
[facility] Provider Note   CSN: [id number] Arrival date & time: 03/16/19  1339     History   Chief Complaint Chief Complaint  Patient presents with  . reaction to shingles shot    HPI Vicki [name] is a 53 y.o. female.     Patient with COPD and history of asthma presents to the emergency department with complaint of left arm pain as well as sensory problems.  Patient reports receiving a shingles vaccine in her left deltoid 4 days ago.  Since that time she has had pain that is described as shooting radiate from her left shoulder down into her left hand.  She states decreased sensation to the point of numbness at times.  Pain is severe at times.  Patient had some swelling in the upper arm which was marked at home with a marker yesterday and today the swelling had gotten worse.  She states that the redness is a little bit better.  No drainage.  No fevers or chills.  She denies any neck pain.  Last night when the pain was particularly severe patient states that she had decreased sensation on her entire left side of the body and was having difficulty moving her arm and leg due to weakness.  This resolved after several hours.  She does not have any history of stroke.  She otherwise denies headache, facial droop, slurred speech.  Patient is not on any anticoagulation.     Past Medical History:  Diagnosis Date  . Asthma   . Carpal tunnel syndrome   . COPD (chronic obstructive pulmonary disease) ([place])   . Diverticulitis     Patient Active Problem List   Diagnosis Date Noted  . Fatigue 11/07/2018  . Depression 11/07/2018  . Insomnia 11/07/2018  . Polypharmacy 07/19/2017  . Asthma   . Carpal tunnel syndrome   . COPD (chronic obstructive pulmonary disease) ([place])   . DDD (degenerative disc disease), lumbar 10/22/2016    Past Surgical History:  Procedure Laterality Date  . ABDOMINAL HYSTERECTOMY  2009  . APPENDECTOMY    . ARTHROSCOPIC  REPAIR ACL Left 2011   x 2  . BUNIONECTOMY  2006   both  . CARPAL TUNNEL RELEASE Bilateral 10/28/2013   Procedure: BILATERAL CARPAL TUNNEL RELEASE;  Surgeon: Wynonia Sours, MD;  Location: [facility];  Service: Orthopedics;  Laterality: Bilateral;  . DILATION AND CURETTAGE OF UTERUS    . TUBAL LIGATION  2004     OB History   No obstetric history on file.      Home Medications    Prior to Admission medications   Medication Sig Start Date End Date Taking? Authorizing Provider  albuterol (VENTOLIN HFA) 108 (90 Base) MCG/ACT inhaler Inhale 2 puffs into the lungs every 4 (four) hours as needed for wheezing or shortness of breath. 03/12/19   Rutherford Guys, MD  ALPRAZolam Duanne Moron) 1 MG tablet TAKE 1/2 TO 1 TABLETS (0.5-1 MG TOTAL) BY MOUTH DAILY AS NEEDED FOR ANXIETY OR SLEEP. 03/12/19   Rutherford Guys, MD  atorvastatin (LIPITOR) 20 MG tablet TAKE 1 TABLET BY MOUTH EVERY DAY 12/14/18   Rutherford Guys, MD  cetirizine (ZYRTEC) 10 MG tablet Take 1 tablet (10 mg total) by mouth daily. 07/19/17   McVey, Gelene Mink, PA-C  citalopram (CELEXA) 20 MG tablet Take 3 tablets (60 mg total) by mouth daily. 03/12/19   Rutherford Guys, MD  eletriptan (RELPAX) 40 MG tablet TAKE  1 TAB BY MOUTH AT ONSET OF MIGRAINE OR HEADACHE AS NEEDED. MAY REPEAT IN 2 HOURS IF PERSISTS 03/12/19   Rutherford Guys, MD  Fluticasone-Salmeterol (ADVAIR DISKUS) 500-50 MCG/DOSE AEPB INHALE 1 PUFF INTO THE LUNGS TWICE A DAY 06/18/18   McVey, Gelene Mink, PA-C  gabapentin (NEURONTIN) 300 MG capsule Take 1 capsule (300 mg total) by mouth at bedtime. 03/12/19   Rutherford Guys, MD  glycopyrrolate (ROBINUL) 1 MG tablet TAKE 1 TABLET BY MOUTH EVERY EVENING 10/19/18   Rutherford Guys, MD  ibuprofen (ADVIL) 800 MG tablet Take 1 tablet (800 mg total) by mouth every 8 (eight) hours as needed for mild pain. 12/18/18   Rutherford Guys, MD  ondansetron (ZOFRAN) 8 MG tablet Take 1 tablet (8 mg total) by mouth every 8  (eight) hours as needed for nausea or vomiting. 12/18/18   Rutherford Guys, MD  rOPINIRole (REQUIP) 1 MG tablet Take 1 tablet (1 mg total) by mouth at bedtime. 06/16/18   Rutherford Guys, MD  tiotropium (SPIRIVA HANDIHALER) 18 MCG inhalation capsule Place 1 capsule (18 mcg total) into inhaler and inhale daily. 01/01/18   McVey, Gelene Mink, PA-C  traZODone (DESYREL) 50 MG tablet Take 0.5-1 tablets (25-50 mg total) by mouth at bedtime as needed for sleep. 09/18/17   McVey, Gelene Mink, PA-C  traZODone (DESYREL) 50 MG tablet TAKE 1 TABLET BY MOUTH EVERYDAY AT BEDTIME 12/22/18   Rutherford Guys, MD  umeclidinium bromide (INCRUSE ELLIPTA) 62.5 MCG/INH AEPB Inhale 1 puff into the lungs daily. 09/17/18   Rutherford Guys, MD  ibuprofen (ADVIL,MOTRIN) 800 MG tablet Take 1 tablet (800 mg total) by mouth every 8 (eight) hours as needed for mild pain. 04/22/18   Rutherford Guys, MD    Family History Family History  Problem Relation Age of Onset  . Cancer Mother   . Cancer Sister 62       breast cancer  . Breast cancer Sister   . Breast cancer Sister     Social History Social History   Tobacco Use  . Smoking status: Current Every Day Smoker    Packs/day: 0.50    Years: 34.00    Pack years: 17.00  . Smokeless tobacco: Never Used  Substance Use Topics  . Alcohol use: Yes    Comment: occ  . Drug use: No     Allergies   Jasmine oil, Penicillins, Aleve [naproxen sodium], Benadryl [diphenhydramine], Ceftin [cefuroxime axetil], Depo-provera [medroxyprogesterone acetate], Doxycycline, Oxycodone, and Tramadol   Review of Systems Review of Systems  Constitutional: Negative for chills and fever.  HENT: Negative for rhinorrhea and sore throat.   Eyes: Negative for redness.  Respiratory: Negative for cough and shortness of breath.   Cardiovascular: Negative for chest pain.  Gastrointestinal: Negative for abdominal pain, diarrhea, nausea and vomiting.  Genitourinary: Negative for dysuria.   Musculoskeletal: Positive for myalgias.  Skin: Positive for color change and rash.  Neurological: Positive for weakness. Negative for facial asymmetry, light-headedness and headaches.     Physical Exam Updated Vital Signs BP (!) 137/96   Pulse 75   Temp 98.8 F (37.1 C) (Oral)   Resp 20   SpO2 98%   Physical Exam Vitals signs and nursing note reviewed.  Constitutional:      Appearance: She is well-developed.     Comments: Patient is tearful.  HENT:     Head: Normocephalic and atraumatic.     Right Ear: Tympanic membrane, ear canal and external  ear normal.     Left Ear: Tympanic membrane, ear canal and external ear normal.     Nose: Nose normal.     Mouth/Throat:     Pharynx: Uvula midline.  Eyes:     General: Lids are normal.        Right eye: No discharge.        Left eye: No discharge.     Extraocular Movements:     Right eye: No nystagmus.     Left eye: No nystagmus.     Conjunctiva/sclera: Conjunctivae normal.     Pupils: Pupils are equal, round, and reactive to light.  Neck:     Musculoskeletal: Normal range of motion and neck supple.  Cardiovascular:     Rate and Rhythm: Normal rate and regular rhythm.     Heart sounds: Normal heart sounds.  Pulmonary:     Effort: Pulmonary effort is normal.     Breath sounds: Normal breath sounds.  Abdominal:     Palpations: Abdomen is soft.     Tenderness: There is no abdominal tenderness.  Musculoskeletal:     Left shoulder: She exhibits tenderness. She exhibits normal range of motion and no bony tenderness.     Left elbow: Normal.     Left wrist: Normal.     Cervical back: She exhibits normal range of motion, no tenderness and no bony tenderness.     Left upper arm: She exhibits tenderness and swelling.       Arms:  Skin:    General: Skin is warm and dry.  Neurological:     Mental Status: She is alert and oriented to person, place, and time.     GCS: GCS eye subscore is 4. GCS verbal subscore is 5. GCS motor  subscore is 6.     Cranial Nerves: No cranial nerve deficit.     Sensory: No sensory deficit.     Coordination: Coordination normal.     Gait: Gait normal.     Deep Tendon Reflexes: Reflexes are normal and symmetric.      ED Treatments / Results  Labs (all labs ordered are listed, but only abnormal results are displayed) Labs Reviewed  ETHANOL - Abnormal; Notable for the following components:      Result Value   Alcohol, Ethyl (B) 231 (*)    All other components within normal limits  COMPREHENSIVE METABOLIC PANEL - Abnormal; Notable for the following components:   Calcium 8.3 (*)    AST 78 (*)    ALT 52 (*)    All other components within normal limits  PROTIME-INR  APTT  CBC  DIFFERENTIAL  RAPID URINE DRUG SCREEN, HOSP PERFORMED  URINALYSIS, ROUTINE W REFLEX MICROSCOPIC  I-STAT BETA HCG BLOOD, ED ([facility], [facility], [facility] ONLY)    EKG None  Radiology Ct Head Wo Contrast  Result Date: 03/16/2019 CLINICAL DATA:  TIA, left-sided numbness since last night EXAM: CT HEAD WITHOUT CONTRAST TECHNIQUE: Contiguous axial images were obtained from the base of the skull through the vertex without intravenous contrast. COMPARISON:  None. FINDINGS: Brain: No evidence of acute infarction, hemorrhage, hydrocephalus, extra-axial collection or mass lesion/mass effect. Vascular: No hyperdense vessel or unexpected calcification. Skull: No osseous abnormality. Sinuses/Orbits: Visualized paranasal sinuses are clear. Visualized mastoid sinuses are clear. Visualized orbits demonstrate no focal abnormality. Other: None IMPRESSION: No acute intracranial pathology. Electronically Signed   By: Kathreen Devoid   On: 03/16/2019 16:06    Procedures Procedures (including critical care time)  Medications  Ordered in ED Medications  HYDROmorphone (DILAUDID) injection 0.5 mg (0.5 mg Intravenous Given 03/16/19 1531)  ondansetron (ZOFRAN) injection 4 mg (4 mg Intravenous Given 03/16/19 1531)  dexamethasone (DECADRON) injection  10 mg (10 mg Intravenous Given 03/16/19 1531)  LORazepam (ATIVAN) injection 1 mg (1 mg Intravenous Given 03/16/19 1613)     Initial Impression / Assessment and Plan / ED Course  I have reviewed the triage vital signs and the nursing notes.  Pertinent labs & imaging results that were available during my care of the patient were reviewed by me and considered in my medical decision making (see chart for details).  Clinical Course as of Mar 16 656  Mon Mar 16, 2019  1534 Shingles shot 1 week ago in left deltoid. Now having pain and numbness with pain and erythema. Workup for DVT, stroke r/o.    [KM]  [id number] Patient workup revealing ethanol level of 231, +THC and mildly elevated LFT. Head CT normal and DVT study negative. Patient and husband aware of results and report known hx of elevated LFT related to alcohol use. I think patient has just a localized inflammatory response from the shingles shot.  However, given her drug and alcohol use I am going to start her on Bactrim in the case this might be infection. White count normal. Patient agrees with plan and advised on strict f/u   [KM]    Clinical Course User Index [KM] Alveria Apley, PA-C       Patient seen and examined.  Patient with multiple symptoms.  Her main complaint is swelling of her left upper arm, consistent with inflammation related to her recent vaccine.  It does not particularly look cellulitic to me.  Doppler study ordered to rule out upper extremity DVT.  Labs pending.  The episode of left-sided numbness and weakness is concerning.  Given the symptoms, will evaluate for stroke with head CT and lab work-up.  Vital signs reviewed and are as follows: BP (!) 137/96   Pulse 75   Temp 98.8 F (37.1 C) (Oral)   Resp 20   SpO2 98%   Patient will need reassessed after treatment and work-up.  Discussed with Wallace Going who will follow-up on results and reassess.   Final Clinical Impressions(s) / ED Diagnoses   Final diagnoses:   None    ED Discharge Orders    None       Carlisle Cater, PA-C 03/16/19 1637    Carlisle Cater, PA-C 03/17/19 [id number]    Charlesetta Shanks, MD 03/27/19 1209

## 2019-03-16 NOTE — Progress Notes (Signed)
VASCULAR LAB PRELIMINARY  PRELIMINARY  PRELIMINARY  PRELIMINARY  Left upper extremity venous duplex completed.    Preliminary report:  See CV proc for results.  Gave Dr. Darl Householder report.  Zakhi Dupre, RVT 03/16/2019, 7:05 PM

## 2019-03-16 NOTE — Discharge Instructions (Signed)
Thank you for allowing me to care for you today. Please return to the emergency department if you have new or worsening symptoms. Take your medications as instructed.  ° °

## 2019-03-16 NOTE — Telephone Encounter (Signed)
Pt having an allergic reaction to vaccine received last week. Refused appt, wants cb from nurse

## 2019-03-16 NOTE — ED Provider Notes (Signed)
[facility] Provider Note   CSN: [id number] Arrival date & time: 03/16/19  1339     History   Chief Complaint Chief Complaint  Patient presents with   reaction to shingles shot    HPI Vicki [name] is a 53 y.o. female.     HPI  Past Medical History:  Diagnosis Date   Asthma    Carpal tunnel syndrome    COPD (chronic obstructive pulmonary disease) ([place])    Diverticulitis     Patient Active Problem List   Diagnosis Date Noted   Fatigue 11/07/2018   Depression 11/07/2018   Insomnia 11/07/2018   Polypharmacy 07/19/2017   Asthma    Carpal tunnel syndrome    COPD (chronic obstructive pulmonary disease) ([place])    DDD (degenerative disc disease), lumbar 10/22/2016    Past Surgical History:  Procedure Laterality Date   ABDOMINAL HYSTERECTOMY  2009   APPENDECTOMY     ARTHROSCOPIC REPAIR ACL Left 2011   x 2   BUNIONECTOMY  2006   both   CARPAL TUNNEL RELEASE Bilateral 10/28/2013   Procedure: BILATERAL CARPAL TUNNEL RELEASE;  Surgeon: Wynonia Sours, MD;  Location: [facility];  Service: Orthopedics;  Laterality: Bilateral;   DILATION AND CURETTAGE OF UTERUS     TUBAL LIGATION  2004     OB History   No obstetric history on file.      Home Medications    Prior to Admission medications   Medication Sig Start Date End Date Taking? Authorizing Provider  albuterol (VENTOLIN HFA) 108 (90 Base) MCG/ACT inhaler Inhale 2 puffs into the lungs every 4 (four) hours as needed for wheezing or shortness of breath. 03/12/19   Rutherford Guys, MD  ALPRAZolam Duanne Moron) 1 MG tablet TAKE 1/2 TO 1 TABLETS (0.5-1 MG TOTAL) BY MOUTH DAILY AS NEEDED FOR ANXIETY OR SLEEP. 03/12/19   Rutherford Guys, MD  atorvastatin (LIPITOR) 20 MG tablet TAKE 1 TABLET BY MOUTH EVERY DAY 12/14/18   Rutherford Guys, MD  cetirizine (ZYRTEC) 10 MG tablet Take 1 tablet (10 mg total) by mouth daily. 07/19/17   McVey, Gelene Mink,  PA-C  citalopram (CELEXA) 20 MG tablet Take 3 tablets (60 mg total) by mouth daily. 03/12/19   Rutherford Guys, MD  eletriptan (RELPAX) 40 MG tablet TAKE 1 TAB BY MOUTH AT ONSET OF MIGRAINE OR HEADACHE AS NEEDED. MAY REPEAT IN 2 HOURS IF PERSISTS 03/12/19   Rutherford Guys, MD  Fluticasone-Salmeterol (ADVAIR DISKUS) 500-50 MCG/DOSE AEPB INHALE 1 PUFF INTO THE LUNGS TWICE A DAY 06/18/18   McVey, Gelene Mink, PA-C  gabapentin (NEURONTIN) 300 MG capsule Take 1 capsule (300 mg total) by mouth at bedtime. 03/12/19   Rutherford Guys, MD  glycopyrrolate (ROBINUL) 1 MG tablet TAKE 1 TABLET BY MOUTH EVERY EVENING 10/19/18   Rutherford Guys, MD  ibuprofen (ADVIL) 800 MG tablet Take 1 tablet (800 mg total) by mouth every 8 (eight) hours as needed for mild pain. 12/18/18   Rutherford Guys, MD  ondansetron (ZOFRAN) 8 MG tablet Take 1 tablet (8 mg total) by mouth every 8 (eight) hours as needed for nausea or vomiting. 12/18/18   Rutherford Guys, MD  rOPINIRole (REQUIP) 1 MG tablet Take 1 tablet (1 mg total) by mouth at bedtime. 06/16/18   Rutherford Guys, MD  sulfamethoxazole-trimethoprim (BACTRIM DS) 800-160 MG tablet Take 1 tablet by mouth 2 (two) times daily for 7 days. 03/16/19 03/23/19  Madilyn Hook A, PA-C  tiotropium (SPIRIVA HANDIHALER) 18 MCG inhalation capsule Place 1 capsule (18 mcg total) into inhaler and inhale daily. 01/01/18   McVey, Gelene Mink, PA-C  traZODone (DESYREL) 50 MG tablet Take 0.5-1 tablets (25-50 mg total) by mouth at bedtime as needed for sleep. 09/18/17   McVey, Gelene Mink, PA-C  traZODone (DESYREL) 50 MG tablet TAKE 1 TABLET BY MOUTH EVERYDAY AT BEDTIME 12/22/18   Rutherford Guys, MD  umeclidinium bromide (INCRUSE ELLIPTA) 62.5 MCG/INH AEPB Inhale 1 puff into the lungs daily. 09/17/18   Rutherford Guys, MD  ibuprofen (ADVIL,MOTRIN) 800 MG tablet Take 1 tablet (800 mg total) by mouth every 8 (eight) hours as needed for mild pain. 04/22/18   Rutherford Guys, MD    Family  History Family History  Problem Relation Age of Onset   Cancer Mother    Cancer Sister 71       breast cancer   Breast cancer Sister    Breast cancer Sister     Social History Social History   Tobacco Use   Smoking status: Current Every Day Smoker    Packs/day: 0.50    Years: 34.00    Pack years: 17.00   Smokeless tobacco: Never Used  Substance Use Topics   Alcohol use: Yes    Comment: occ   Drug use: No     Allergies   Jasmine oil, Penicillins, Aleve [naproxen sodium], Benadryl [diphenhydramine], Ceftin [cefuroxime axetil], Depo-provera [medroxyprogesterone acetate], Doxycycline, Oxycodone, and Tramadol   Review of Systems Review of Systems   Physical Exam Updated Vital Signs BP (!) 131/91    Pulse 82    Temp 98.7 F (37.1 C) (Oral)    Resp (!) 21    SpO2 91%   Physical Exam Vitals signs and nursing note reviewed.  Constitutional:      General: She is not in acute distress.    Appearance: Normal appearance. She is not ill-appearing, toxic-appearing or diaphoretic.  HENT:     Head: Normocephalic.     Mouth/Throat:     Mouth: Mucous membranes are moist.  Eyes:     Conjunctiva/sclera: Conjunctivae normal.  Pulmonary:     Effort: Pulmonary effort is normal.  Skin:    General: Skin is dry.     Capillary Refill: Capillary refill takes less than 2 seconds.     Comments: There is an area of erythema over the left deltoid.  It is tender to palpation.  She has normal distal pulses, strength, and sensation.  Neurological:     General: No focal deficit present.     Mental Status: She is alert.  Psychiatric:        Mood and Affect: Mood normal.     Comments: Very anxious and tearful      ED Treatments / Results  Labs (all labs ordered are listed, but only abnormal results are displayed) Labs Reviewed  ETHANOL - Abnormal; Notable for the following components:      Result Value   Alcohol, Ethyl (B) 231 (*)    All other components within normal limits   COMPREHENSIVE METABOLIC PANEL - Abnormal; Notable for the following components:   Calcium 8.3 (*)    AST 78 (*)    ALT 52 (*)    All other components within normal limits  RAPID URINE DRUG SCREEN, HOSP PERFORMED - Abnormal; Notable for the following components:   Tetrahydrocannabinol POSITIVE (*)    All other components within normal limits  URINALYSIS,  ROUTINE W REFLEX MICROSCOPIC - Abnormal; Notable for the following components:   APPearance HAZY (*)    All other components within normal limits  PROTIME-INR  APTT  CBC  DIFFERENTIAL  I-STAT BETA HCG BLOOD, ED ([facility], [facility], [facility] ONLY)    EKG None  Radiology Ct Head Wo Contrast  Result Date: 03/16/2019 CLINICAL DATA:  TIA, left-sided numbness since last night EXAM: CT HEAD WITHOUT CONTRAST TECHNIQUE: Contiguous axial images were obtained from the base of the skull through the vertex without intravenous contrast. COMPARISON:  None. FINDINGS: Brain: No evidence of acute infarction, hemorrhage, hydrocephalus, extra-axial collection or mass lesion/mass effect. Vascular: No hyperdense vessel or unexpected calcification. Skull: No osseous abnormality. Sinuses/Orbits: Visualized paranasal sinuses are clear. Visualized mastoid sinuses are clear. Visualized orbits demonstrate no focal abnormality. Other: None IMPRESSION: No acute intracranial pathology. Electronically Signed   By: Kathreen Devoid   On: 03/16/2019 16:06    Procedures Procedures (including critical care time)  Medications Ordered in ED Medications  HYDROmorphone (DILAUDID) injection 0.5 mg (0.5 mg Intravenous Given 03/16/19 1531)  ondansetron (ZOFRAN) injection 4 mg (4 mg Intravenous Given 03/16/19 1531)  dexamethasone (DECADRON) injection 10 mg (10 mg Intravenous Given 03/16/19 1531)  LORazepam (ATIVAN) injection 1 mg (1 mg Intravenous Given 03/16/19 1613)     Initial Impression / Assessment and Plan / ED Course  I have reviewed the triage vital signs and the nursing  notes.  Pertinent labs & imaging results that were available during my care of the patient were reviewed by me and considered in my medical decision making (see chart for details).  Clinical Course as of Mar 15 1902  [facility] Mar 16, 2019  1534 Shingles shot 1 week ago in left deltoid. Now having pain and numbness with pain and erythema. Workup for DVT, stroke r/o.    [KM]  [id number] Patient workup revealing ethanol level of 231, +THC and mildly elevated LFT. Head CT normal and DVT study negative. Patient and husband aware of results and report known hx of elevated LFT related to alcohol use. I think patient has just a localized inflammatory response from the shingles shot.  However, given her drug and alcohol use I am going to start her on Bactrim in the case this might be infection. White count normal. Patient agrees with plan and advised on strict f/u   [KM]    Clinical Course User Index [KM] Alveria Apley, PA-C       Care assumed from [facility] due to change of shift.  Patient here for complaints of reaction to her left deltoid after shingles shots.  States that it is very painful and she is having shooting pains into her fingers.  Also reports that yesterday she had entire numbness to the left side of her body which lasted several minutes and resolved on its own.  Currently has no neurological findings on my exam.  Patient is tearful and husband is in the room and states that the patient has severe anxiety.  He states that the patient has not had her Xanax in over 1 month because she lost her prescription.  She is asking for something for anxiety.    Final Clinical Impressions(s) / ED Diagnoses   Final diagnoses:  Adverse effect of vaccine, initial encounter    ED Discharge Orders         Ordered    sulfamethoxazole-trimethoprim (BACTRIM DS) 800-160 MG tablet  2 times daily     03/16/19 1859  Kristine Royal 03/16/19 1903    Drenda Freeze, MD 03/17/19  1335

## 2019-03-16 NOTE — ED Triage Notes (Addendum)
Had shingles shot on Thursday-in left deltoid, pneumonia shot in right deltoid-- left arm swollen, red, very painful per pt-- daughter has drawn a circle around swelling yesterday- redness has exceeded that line- Pt is continuously crying at triage, equal radial pulses bilaterally

## 2019-03-16 NOTE — Telephone Encounter (Signed)
Patient calls after refusing office visit from pcp this morning. Had 1st shingles injection on 03/12/2019 in left upper arm. Swelling, redness, heat and pain has increased since injection. Also, reported she experienced left-sided numbness from her face to her leg. Mostly that has resolved except for in the left arm. Stated she has iced the area and used tylenol/advil with no relief. Reports she has increased shortness of breath, more than her usual COPD SOB along with increased saliva. No core fever. Advised UC/ED at this time for possible allergic reaction. Informed to wear mask, keep a sage distance from others and use the hand sanitizer frequently. Husband will drive her.   Reason for Disposition . [1] Over 3 days (72 hours) since shot AND [2] redness, swelling or pain getting worse  Answer Assessment - Initial Assessment Questions 1. SYMPTOMS: "What is the main symptom?" (e.g., redness, swelling, pain)     Left arm swollen size of grapefruit, redness and pain 2. ONSET: "When was the vaccine (shot) given?" "How much later did the  begin?" (e.g., hours, days ago)      03/12/19 Symptoms began hours after the injection and worsened with no improvement. 3. SEVERITY: "How bad is it?"      Entire arm has sharp shooting pains and some numbness  4. FEVER: "Is there a fever?" If so, ask: "What is it, how was it measured, and when did it start?"      Injection area and surrounding skin warm to touch 5. IMMUNIZATIONS GIVEN: "What shots have you recently received?"     Shingles 1st in the series.6. PAST REACTIONS: "Have you reacted to immunizations before?" If so, ask: "What happened?"     no 7. OTHER SYMPTOMS: "Do you have any other symptoms?"    Had numbness on entire left side of body.  Protocols used: IMMUNIZATION REACTIONS-A-AH

## 2019-03-16 NOTE — Telephone Encounter (Signed)
This encounter was created in error - please disregard.

## 2019-03-16 NOTE — ED Notes (Signed)
Got patient into a gown on the monitor patient is resting with nurse at bedside °

## 2019-03-16 NOTE — ED Notes (Signed)
Patient verbalizes understanding of discharge instructions . Opportunity for questions and answers were provided . Armband removed by staff ,Pt discharged from ED. W/C  offered at D/C  and Declined W/C at D/C and was escorted to lobby by RN.  

## 2019-03-17 NOTE — Telephone Encounter (Signed)
Patient went to ED

## 2019-04-07 ENCOUNTER — Other Ambulatory Visit: Payer: Self-pay

## 2019-04-07 ENCOUNTER — Encounter (HOSPITAL_COMMUNITY): Payer: Self-pay

## 2019-04-07 ENCOUNTER — Inpatient Hospital Stay (HOSPITAL_COMMUNITY)
Admission: EM | Admit: 2019-04-07 | Discharge: 2019-04-11 | DRG: 194 | Disposition: A | Discharge status: Home / Self Care | Payer: 59 | Attending: Internal Medicine | Admitting: Internal Medicine

## 2019-04-07 ENCOUNTER — Emergency Department (HOSPITAL_COMMUNITY): Payer: 59

## 2019-04-07 DIAGNOSIS — N12 Tubulo-interstitial nephritis, not specified as acute or chronic: Secondary | ICD-10-CM

## 2019-04-07 DIAGNOSIS — Z1612 Extended spectrum beta lactamase (ESBL) resistance: Secondary | ICD-10-CM | POA: Diagnosis present

## 2019-04-07 DIAGNOSIS — T368X5A Adverse effect of other systemic antibiotics, initial encounter: Secondary | ICD-10-CM | POA: Diagnosis not present

## 2019-04-07 DIAGNOSIS — Z885 Allergy status to narcotic agent status: Secondary | ICD-10-CM | POA: Diagnosis not present

## 2019-04-07 DIAGNOSIS — R0602 Shortness of breath: Secondary | ICD-10-CM | POA: Diagnosis not present

## 2019-04-07 DIAGNOSIS — Z7951 Long term (current) use of inhaled steroids: Secondary | ICD-10-CM | POA: Diagnosis not present

## 2019-04-07 DIAGNOSIS — F10239 Alcohol dependence with withdrawal, unspecified: Secondary | ICD-10-CM | POA: Diagnosis present

## 2019-04-07 DIAGNOSIS — Z880 Allergy status to penicillin: Secondary | ICD-10-CM | POA: Diagnosis not present

## 2019-04-07 DIAGNOSIS — I4581 Long QT syndrome: Secondary | ICD-10-CM | POA: Diagnosis present

## 2019-04-07 DIAGNOSIS — F329 Major depressive disorder, single episode, unspecified: Secondary | ICD-10-CM | POA: Diagnosis present

## 2019-04-07 DIAGNOSIS — Z20828 Contact with and (suspected) exposure to other viral communicable diseases: Secondary | ICD-10-CM | POA: Diagnosis present

## 2019-04-07 DIAGNOSIS — R7989 Other specified abnormal findings of blood chemistry: Secondary | ICD-10-CM

## 2019-04-07 DIAGNOSIS — J189 Pneumonia, unspecified organism: Principal | ICD-10-CM | POA: Diagnosis present

## 2019-04-07 DIAGNOSIS — N390 Urinary tract infection, site not specified: Secondary | ICD-10-CM | POA: Diagnosis present

## 2019-04-07 DIAGNOSIS — R6510 Systemic inflammatory response syndrome (SIRS) of non-infectious origin without acute organ dysfunction: Secondary | ICD-10-CM

## 2019-04-07 DIAGNOSIS — J440 Chronic obstructive pulmonary disease with acute lower respiratory infection: Secondary | ICD-10-CM | POA: Diagnosis present

## 2019-04-07 DIAGNOSIS — A084 Viral intestinal infection, unspecified: Secondary | ICD-10-CM | POA: Diagnosis present

## 2019-04-07 DIAGNOSIS — N3000 Acute cystitis without hematuria: Secondary | ICD-10-CM | POA: Diagnosis not present

## 2019-04-07 DIAGNOSIS — Z20822 Contact with and (suspected) exposure to covid-19: Secondary | ICD-10-CM

## 2019-04-07 DIAGNOSIS — Z881 Allergy status to other antibiotic agents status: Secondary | ICD-10-CM

## 2019-04-07 DIAGNOSIS — F419 Anxiety disorder, unspecified: Secondary | ICD-10-CM | POA: Diagnosis present

## 2019-04-07 DIAGNOSIS — Z888 Allergy status to other drugs, medicaments and biological substances status: Secondary | ICD-10-CM | POA: Diagnosis not present

## 2019-04-07 DIAGNOSIS — R9431 Abnormal electrocardiogram [ECG] [EKG]: Secondary | ICD-10-CM

## 2019-04-07 DIAGNOSIS — B9620 Unspecified Escherichia coli [E. coli] as the cause of diseases classified elsewhere: Secondary | ICD-10-CM | POA: Diagnosis present

## 2019-04-07 DIAGNOSIS — Z79899 Other long term (current) drug therapy: Secondary | ICD-10-CM | POA: Diagnosis not present

## 2019-04-07 DIAGNOSIS — L299 Pruritus, unspecified: Secondary | ICD-10-CM | POA: Diagnosis not present

## 2019-04-07 HISTORY — DX: Pneumonia, unspecified organism: J18.9

## 2019-04-07 LAB — CBC
HCT: 45.6 % (ref 36.0–46.0)
Hemoglobin: 15.8 g/dL — ABNORMAL HIGH (ref 12.0–15.0)
MCH: 31.9 pg (ref 26.0–34.0)
MCHC: 34.6 g/dL (ref 30.0–36.0)
MCV: 91.9 fL (ref 80.0–100.0)
Platelets: 240 10*3/uL (ref 150–400)
RBC: 4.96 MIL/uL (ref 3.87–5.11)
RDW: 12.2 % (ref 11.5–15.5)
WBC: 14 10*3/uL — ABNORMAL HIGH (ref 4.0–10.5)
nRBC: 0 % (ref 0.0–0.2)

## 2019-04-07 LAB — URINALYSIS, ROUTINE W REFLEX MICROSCOPIC
Bilirubin Urine: NEGATIVE
Glucose, UA: NEGATIVE mg/dL
Ketones, ur: 80 mg/dL — AB
Nitrite: NEGATIVE
Protein, ur: 100 mg/dL — AB
Specific Gravity, Urine: 1.024 (ref 1.005–1.030)
pH: 5 (ref 5.0–8.0)

## 2019-04-07 LAB — COMPREHENSIVE METABOLIC PANEL
ALT: 32 U/L (ref 0–44)
AST: 69 U/L — ABNORMAL HIGH (ref 15–41)
Albumin: 4.3 g/dL (ref 3.5–5.0)
Alkaline Phosphatase: 159 U/L — ABNORMAL HIGH (ref 38–126)
Anion gap: 17 — ABNORMAL HIGH (ref 5–15)
BUN: 15 mg/dL (ref 6–20)
CO2: 18 mmol/L — ABNORMAL LOW (ref 22–32)
Calcium: 8.7 mg/dL — ABNORMAL LOW (ref 8.9–10.3)
Chloride: 102 mmol/L (ref 98–111)
Creatinine, Ser: 0.92 mg/dL (ref 0.44–1.00)
GFR calc Af Amer: >60 mL/min (ref >60)
GFR calc non Af Amer: >60 mL/min (ref >60)
Glucose, Bld: 129 mg/dL — ABNORMAL HIGH (ref 70–99)
Potassium: 4 mmol/L (ref 3.5–5.1)
Sodium: 137 mmol/L (ref 135–145)
Total Bilirubin: 1.2 mg/dL (ref 0.3–1.2)
Total Protein: 7.6 g/dL (ref 6.5–8.1)

## 2019-04-07 LAB — ETHANOL: Alcohol, Ethyl (B): <10 mg/dL (ref <10)

## 2019-04-07 LAB — LACTIC ACID, PLASMA: Lactic Acid, Venous: 1.7 mmol/L (ref 0.5–1.9)

## 2019-04-07 LAB — SARS CORONAVIRUS 2 (TAT 6-24 HRS): SARS Coronavirus 2: NEGATIVE

## 2019-04-07 LAB — I-STAT BETA HCG BLOOD, ED (MC, WL, AP ONLY): I-stat hCG, quantitative: <5 m[IU]/mL (ref <5)

## 2019-04-07 LAB — PROCALCITONIN: Procalcitonin: <0.1 ng/mL

## 2019-04-07 LAB — MAGNESIUM: Magnesium: 1.6 mg/dL — ABNORMAL LOW (ref 1.7–2.4)

## 2019-04-07 LAB — LIPASE, BLOOD: Lipase: 25 U/L (ref 11–51)

## 2019-04-07 MED ORDER — ACETAMINOPHEN 325 MG PO TABS
650.0000 mg | ORAL_TABLET | Freq: Four times a day (QID) | ORAL | Status: DC | PRN
  Administered 2019-04-08 – 2019-04-11 (×3): 650 mg via ORAL
  Filled 2019-04-07 (×3): qty 2

## 2019-04-07 MED ORDER — LORAZEPAM 1 MG PO TABS
1.0000 mg | ORAL_TABLET | ORAL | Status: AC | PRN
  Administered 2019-04-09: 2 mg via ORAL
  Filled 2019-04-07: qty 2

## 2019-04-07 MED ORDER — ONDANSETRON HCL 4 MG/2ML IJ SOLN
4.0000 mg | Freq: Once | INTRAMUSCULAR | Status: AC
  Administered 2019-04-07: 4 mg via INTRAVENOUS
  Filled 2019-04-07: qty 2

## 2019-04-07 MED ORDER — SODIUM CHLORIDE 0.9% FLUSH
3.0000 mL | Freq: Once | INTRAVENOUS | Status: AC
  Administered 2019-04-07: 3 mL via INTRAVENOUS

## 2019-04-07 MED ORDER — LORAZEPAM 2 MG/ML IJ SOLN
2.0000 mg | Freq: Once | INTRAMUSCULAR | Status: AC
  Administered 2019-04-07: 2 mg via INTRAVENOUS
  Filled 2019-04-07: qty 1

## 2019-04-07 MED ORDER — LORAZEPAM 2 MG/ML IJ SOLN
1.0000 mg | INTRAMUSCULAR | Status: AC | PRN
  Administered 2019-04-08 (×2): 2 mg via INTRAVENOUS
  Administered 2019-04-09 – 2019-04-10 (×2): 3 mg via INTRAVENOUS
  Filled 2019-04-07 (×2): qty 1
  Filled 2019-04-07 (×2): qty 2

## 2019-04-07 MED ORDER — FOLIC ACID 1 MG PO TABS
1.0000 mg | ORAL_TABLET | Freq: Every day | ORAL | Status: DC
  Administered 2019-04-07 – 2019-04-11 (×5): 1 mg via ORAL
  Filled 2019-04-07 (×5): qty 1

## 2019-04-07 MED ORDER — PROCHLORPERAZINE EDISYLATE 10 MG/2ML IJ SOLN
10.0000 mg | Freq: Four times a day (QID) | INTRAMUSCULAR | Status: DC | PRN
  Administered 2019-04-08 – 2019-04-09 (×5): 10 mg via INTRAVENOUS
  Filled 2019-04-07 (×7): qty 2

## 2019-04-07 MED ORDER — SODIUM CHLORIDE 0.9 % IV SOLN
100.0000 mg | Freq: Two times a day (BID) | INTRAVENOUS | Status: DC
  Administered 2019-04-07 – 2019-04-09 (×4): 100 mg via INTRAVENOUS
  Filled 2019-04-07 (×5): qty 100

## 2019-04-07 MED ORDER — NICOTINE 14 MG/24HR TD PT24
14.0000 mg | MEDICATED_PATCH | Freq: Every day | TRANSDERMAL | Status: DC
  Administered 2019-04-08 – 2019-04-11 (×4): 14 mg via TRANSDERMAL
  Filled 2019-04-07 (×5): qty 1

## 2019-04-07 MED ORDER — THIAMINE HCL 100 MG/ML IJ SOLN
100.0000 mg | Freq: Every day | INTRAMUSCULAR | Status: DC
  Administered 2019-04-08: 100 mg via INTRAVENOUS
  Filled 2019-04-07: qty 2

## 2019-04-07 MED ORDER — SODIUM CHLORIDE 0.9 % IV BOLUS
1000.0000 mL | Freq: Once | INTRAVENOUS | Status: AC
  Administered 2019-04-07: 1000 mL via INTRAVENOUS

## 2019-04-07 MED ORDER — ADULT MULTIVITAMIN W/MINERALS CH
1.0000 | ORAL_TABLET | Freq: Every day | ORAL | Status: DC
  Administered 2019-04-07 – 2019-04-11 (×5): 1 via ORAL
  Filled 2019-04-07 (×5): qty 1

## 2019-04-07 MED ORDER — ACETAMINOPHEN 650 MG RE SUPP: 650.0000 mg | Freq: Four times a day (QID) | RECTAL | Status: DC | PRN

## 2019-04-07 MED ORDER — VITAMIN B-1 100 MG PO TABS
100.0000 mg | ORAL_TABLET | Freq: Every day | ORAL | Status: DC
  Administered 2019-04-07 – 2019-04-11 (×4): 100 mg via ORAL
  Filled 2019-04-07 (×4): qty 1

## 2019-04-07 MED ORDER — SODIUM CHLORIDE 0.9 % IV SOLN
1.0000 g | Freq: Once | INTRAVENOUS | Status: AC
  Administered 2019-04-07: 1 g via INTRAVENOUS
  Filled 2019-04-07: qty 10

## 2019-04-07 MED ORDER — ENOXAPARIN SODIUM 40 MG/0.4ML ~~LOC~~ SOLN
40.0000 mg | SUBCUTANEOUS | Status: DC
  Administered 2019-04-07 – 2019-04-10 (×4): 40 mg via SUBCUTANEOUS
  Filled 2019-04-07 (×4): qty 0.4

## 2019-04-07 MED ORDER — MORPHINE SULFATE (PF) 4 MG/ML IV SOLN
4.0000 mg | Freq: Once | INTRAVENOUS | Status: AC
  Administered 2019-04-07: 4 mg via INTRAVENOUS
  Filled 2019-04-07: qty 1

## 2019-04-07 MED ORDER — SODIUM CHLORIDE 0.9 % IV SOLN
INTRAVENOUS | Status: AC
  Administered 2019-04-07 – 2019-04-08 (×2): via INTRAVENOUS

## 2019-04-07 MED ORDER — SODIUM CHLORIDE 0.9 % IV SOLN
500.0000 mg | Freq: Once | INTRAVENOUS | Status: DC
  Filled 2019-04-07: qty 500

## 2019-04-07 MED ORDER — SODIUM CHLORIDE 0.9 % IV SOLN
1.0000 g | INTRAVENOUS | Status: DC
  Administered 2019-04-08: 1 g via INTRAVENOUS
  Filled 2019-04-07: qty 10

## 2019-04-07 NOTE — ED Triage Notes (Signed)
Pt from home with ems c.o diarrhea x3 days, n/v since last night and dizziness. Pt has hx of abd hernia. Pt has had a total of 8mg  zofran today and 582ml IV fluid. Pt alert, states she feels like she is going to pass out

## 2019-04-07 NOTE — ED Provider Notes (Signed)
[facility] EMERGENCY DEPARTMENT Provider Note   CSN: [id number] Arrival date & time: 04/07/19  1415     History   Chief Complaint Chief Complaint  Patient presents with   Abdominal Pain   Emesis   Diarrhea   Dizziness    HPI Vicki [name] is a 53 y.o. female with pertinent past medical history of COPD, depression and anxiety on Xanax, diverticulitis, transaminitis, nighttime hypoxia on recent sleep study, hysterectomy, appendectomy presents to the ER for evaluation of diarrhea for the last 3 days.  Described as profuse, watery, yellow.  States diarrhea is now just coming out without a warning.  Has associated nausea and nonbilious emesis that began last night.  Has vomited more than 10 times, her last time just a few minutes before encounter.  States the last few episodes of vomiting have had bright red streaks of blood in it but denies any coffee-ground emesis.  Also reports associated epigastric abdominal "burning", sore throat for the last 4 days, increased cough with sputum, lightheadedness, chest tightness and shortness of breath with exertion.  Reports having a chronic cough due to her COPD but states the cough now is more frequent, forceful and slightly more sputum production.  States she feels like she has the flu.   Chronic congestion she attributes to having a dog. She has a known hernia in the upper abdomen that goes back and spontaneously if she lifts her arm.  She took nausea medicine x2 today without any improvement.  She drinks 2 coolers of wine every other day, her last drink was 4 days ago.  States she has never had any symptoms of withdrawal specifically headaches, nausea, vomiting, abdominal pain, diarrhea, sweats or tremors or seizures.  She used to take a lot of aspirin 6 months ago but stopped after she had a bad nosebleed.  Her husband was sick with a bad cold 2 weeks ago but now better.  Denies known exposure to COVID-19 or other sick contacts.   No travel.  Reports night time sweats that she attributes to hysterectomy but no fever, dysuria.     HPI  Past Medical History:  Diagnosis Date   Asthma    Carpal tunnel syndrome    COPD (chronic obstructive pulmonary disease) ([place])    Diverticulitis     Patient Active Problem List   Diagnosis Date Noted   CAP (community acquired pneumonia) 04/07/2019   Fatigue 11/07/2018   Depression 11/07/2018   Insomnia 11/07/2018   Polypharmacy 07/19/2017   Asthma    Carpal tunnel syndrome    COPD (chronic obstructive pulmonary disease) ([place])    DDD (degenerative disc disease), lumbar 10/22/2016    Past Surgical History:  Procedure Laterality Date   ABDOMINAL HYSTERECTOMY  2009   APPENDECTOMY     ARTHROSCOPIC REPAIR ACL Left 2011   x 2   BUNIONECTOMY  2006   both   CARPAL TUNNEL RELEASE Bilateral 10/28/2013   Procedure: BILATERAL CARPAL TUNNEL RELEASE;  Surgeon: Wynonia Sours, MD;  Location: [facility];  Service: Orthopedics;  Laterality: Bilateral;   DILATION AND CURETTAGE OF UTERUS     TUBAL LIGATION  2004     OB History   No obstetric history on file.      Home Medications    Prior to Admission medications   Medication Sig Start Date End Date Taking? Authorizing Provider  acetaminophen (TYLENOL) 325 MG tablet Take 650 mg by mouth every 6 (six) hours as needed.  Yes [provider]  albuterol (VENTOLIN HFA) 108 (90 Base) MCG/ACT inhaler Inhale 2 puffs into the lungs every 4 (four) hours as needed for wheezing or shortness of breath. 03/12/19  Yes Rutherford Guys, MD  ALPRAZolam Duanne Moron) 1 MG tablet TAKE 1/2 TO 1 TABLETS (0.5-1 MG TOTAL) BY MOUTH DAILY AS NEEDED FOR ANXIETY OR SLEEP. Patient taking differently: Take 0.5-1 mg by mouth daily as needed for anxiety or sleep.  03/12/19  Yes Rutherford Guys, MD  atorvastatin (LIPITOR) 20 MG tablet TAKE 1 TABLET BY MOUTH EVERY DAY Patient taking differently: Take 20 mg by mouth daily.   03/17/19  Yes Rutherford Guys, MD  bismuth subsalicylate (PEPTO BISMOL) 262 MG chewable tablet Chew 524 mg by mouth as needed for diarrhea or loose stools.   Yes [provider]  cetirizine (ZYRTEC) 10 MG tablet Take 1 tablet (10 mg total) by mouth daily. 07/19/17  Yes McVey, Gelene Mink, PA-C  citalopram (CELEXA) 20 MG tablet Take 3 tablets (60 mg total) by mouth daily. 03/12/19  Yes Rutherford Guys, MD  eletriptan (RELPAX) 40 MG tablet TAKE 1 TAB BY MOUTH AT ONSET OF MIGRAINE OR HEADACHE AS NEEDED. MAY REPEAT IN 2 HOURS IF PERSISTS Patient taking differently: Take 40 mg by mouth See admin instructions. Take 40mg  at onset of migraine or headache as needed. May repeat in 2 hours if symptoms persist. 03/12/19  Yes Rutherford Guys, MD  Fluticasone-Salmeterol (ADVAIR DISKUS) 500-50 MCG/DOSE AEPB INHALE 1 PUFF INTO THE LUNGS TWICE A DAY Patient taking differently: Inhale 1 puff into the lungs 2 (two) times daily.  06/18/18  Yes McVey, Gelene Mink, PA-C  gabapentin (NEURONTIN) 300 MG capsule Take 1 capsule (300 mg total) by mouth at bedtime. 03/12/19  Yes Rutherford Guys, MD  glycopyrrolate (ROBINUL) 1 MG tablet TAKE 1 TABLET BY MOUTH EVERY EVENING Patient taking differently: Take 1 mg by mouth every evening.  10/19/18  Yes Rutherford Guys, MD  guaiFENesin (ROBITUSSIN) 100 MG/5ML liquid Take 200 mg by mouth 3 (three) times daily as needed for cough.   Yes [provider]  ibuprofen (ADVIL) 800 MG tablet Take 1 tablet (800 mg total) by mouth every 8 (eight) hours as needed for mild pain. 12/18/18  Yes Rutherford Guys, MD  ondansetron (ZOFRAN) 8 MG tablet Take 1 tablet (8 mg total) by mouth every 8 (eight) hours as needed for nausea or vomiting. 12/18/18  Yes Rutherford Guys, MD  rOPINIRole (REQUIP) 1 MG tablet Take 1 tablet (1 mg total) by mouth at bedtime. 06/16/18  Yes Rutherford Guys, MD  traZODone (DESYREL) 50 MG tablet Take 0.5-1 tablets (25-50 mg total) by mouth at bedtime as  needed for sleep. 09/18/17  Yes McVey, Gelene Mink, PA-C  traZODone (DESYREL) 50 MG tablet TAKE 1 TABLET BY MOUTH EVERYDAY AT BEDTIME Patient taking differently: Take 50 mg by mouth at bedtime.  12/22/18  Yes Rutherford Guys, MD  umeclidinium bromide (INCRUSE ELLIPTA) 62.5 MCG/INH AEPB Inhale 1 puff into the lungs daily. 09/17/18  Yes Rutherford Guys, MD  tiotropium (SPIRIVA HANDIHALER) 18 MCG inhalation capsule Place 1 capsule (18 mcg total) into inhaler and inhale daily. Patient not taking: Reported on 04/07/2019 01/01/18   McVey, Gelene Mink, PA-C  ibuprofen (ADVIL,MOTRIN) 800 MG tablet Take 1 tablet (800 mg total) by mouth every 8 (eight) hours as needed for mild pain. 04/22/18   Rutherford Guys, MD    Family History Family History  Problem Relation Age of Onset   Cancer Mother    Cancer Sister 47       breast cancer   Breast cancer Sister    Breast cancer Sister     Social History Social History   Tobacco Use   Smoking status: Current Every Day Smoker    Packs/day: 0.50    Years: 34.00    Pack years: 17.00   Smokeless tobacco: Never Used  Substance Use Topics   Alcohol use: Yes    Comment: occ   Drug use: No     Allergies   Jasmine oil, Penicillins, Aleve [naproxen sodium], Benadryl [diphenhydramine], Ceftin [cefuroxime axetil], Depo-provera [medroxyprogesterone acetate], Doxycycline, Oxycodone, and Tramadol   Review of Systems Review of Systems  Constitutional: Positive for diaphoresis.  HENT: Positive for congestion and sore throat.   Respiratory: Positive for cough, chest tightness and shortness of breath.   Gastrointestinal: Positive for abdominal pain, diarrhea, nausea and vomiting.  Neurological: Positive for light-headedness.  All other systems reviewed and are negative.    Physical Exam Updated Vital Signs BP (!) 134/98    Pulse (!) 108    Temp 97.7 F (36.5 C) (Oral)    Resp 19    SpO2 96%   Physical Exam Vitals signs and nursing  note reviewed.  Constitutional:      Appearance: She is well-developed. She is diaphoretic.     Comments: Looks uncomfortable, actively vomiting.  HENT:     Head: Normocephalic and atraumatic.     Nose: Nose normal.     Mouth/Throat:     Comments: Dry lips but moist mucous membranes.  Diffuse mild erythema in the oropharynx, soft palate.  Tonsils not visualized, uvula midline.  No trismus.  No asymmetry in the oropharynx/tonsils.  Normal sublingual space, tongue protrusion and phonation.  Tolerating secretions. Eyes:     Conjunctiva/sclera: Conjunctivae normal.  Neck:     Musculoskeletal: Normal range of motion.  Cardiovascular:     Rate and Rhythm: Regular rhythm. Tachycardia present.     Comments: HR 110s before entering room, HR goes up to 140s during encounter, vomiting and moving. 1+ radial and DP pulses bilaterally.  No calf edema or tenderness.  No lower extremity edema. Pulmonary:     Effort: Pulmonary effort is normal.     Breath sounds: Normal breath sounds.     Comments: SpO2 91-99% on RA during exam, during conversation at rest, vomiting.  Speaking in full sentences, no obvious increased work of breathing. No wheezing, crackles.   Abdominal:     General: Bowel sounds are normal.     Palpations: Abdomen is soft.     Tenderness: There is abdominal tenderness in the epigastric area, suprapubic area and left lower quadrant. There is left CVA tenderness.     Comments: No G/R/R. Negative Murphy's and McBurney's. Active BS to lower quadrants.  No obvious hernias noted when laying supine.  Musculoskeletal: Normal range of motion.  Skin:    General: Skin is warm.     Capillary Refill: Capillary refill takes less than 2 seconds.  Neurological:     Mental Status: She is alert.     Comments: Questionable subtle tremors in hands however patient is vomiting, appears uncomfortable   Psychiatric:        Behavior: Behavior normal.      ED Treatments / Results  Labs (all labs  ordered are listed, but only abnormal results are displayed) Labs Reviewed  CBC - Abnormal; Notable for  the following components:      Result Value   WBC 14.0 (*)    Hemoglobin 15.8 (*)    All other components within normal limits  URINALYSIS, ROUTINE W REFLEX MICROSCOPIC - Abnormal; Notable for the following components:   Color, Urine AMBER (*)    APPearance CLOUDY (*)    Hgb urine dipstick LARGE (*)    Ketones, ur 80 (*)    Protein, ur 100 (*)    Leukocytes,Ua MODERATE (*)    Bacteria, UA MANY (*)    All other components within normal limits  COMPREHENSIVE METABOLIC PANEL - Abnormal; Notable for the following components:   CO2 18 (*)    Glucose, Bld 129 (*)    Calcium 8.7 (*)    AST 69 (*)    Alkaline Phosphatase 159 (*)    Anion gap 17 (*)    All other components within normal limits  SARS CORONAVIRUS 2 (TAT 6-24 HRS)  LIPASE, BLOOD  LACTIC ACID, PLASMA  ETHANOL  LACTIC ACID, PLASMA  I-STAT BETA HCG BLOOD, ED ([facility], [facility], [facility] ONLY)    EKG EKG Interpretation  Date/Time:  Tuesday April 07 2019 14:51:22 EST Ventricular Rate:  131 PR Interval:  146 QRS Duration: 60 QT Interval:  380 QTC Calculation: 561 R Axis:   76 Text Interpretation: ** Critical Test Result: Long QTc Sinus tachycardia Otherwise normal ECG No STEMI Confirmed by Nanda Quinton [phone]) on 04/07/2019 7:06:21 PM   Radiology Dg Chest Portable 1 View  Result Date: 04/07/2019 CLINICAL DATA:  Cough. EXAM: PORTABLE CHEST 1 VIEW COMPARISON:  Chest x-ray dated July 01, 2018. FINDINGS: The heart size and mediastinal contours are within normal limits. Normal pulmonary vascularity. The lungs remain hyperinflated. New mild opacity at the left lung base. No pleural effusion or pneumothorax. No acute osseous abnormality. IMPRESSION: 1. Mild left lower lobe pneumonia. 2. COPD. Electronically Signed   By: Titus Dubin M.D.   On: 04/07/2019 18:17    Procedures .Critical Care Performed by: Kinnie Feil,  PA-C Authorized by: Kinnie Feil, PA-C   Critical care provider statement:    Critical care time (minutes):  45   Critical care was necessary to treat or prevent imminent or life-threatening deterioration of the following conditions: SIRS, UTI, CAP.   Critical care was time spent personally by me on the following activities:  Discussions with consultants, evaluation of patient's response to treatment, examination of patient, ordering and performing treatments and interventions, ordering and review of laboratory studies, ordering and review of radiographic studies, pulse oximetry, re-evaluation of patient's condition, obtaining history from patient or surrogate, review of old charts and development of treatment plan with patient or surrogate   I assumed direction of critical care for this patient from another provider in my specialty: no     (including critical care time)  Medications Ordered in ED Medications  cefTRIAXone (ROCEPHIN) 1 g in sodium chloride 0.9 % 100 mL IVPB (1 g Intravenous New Bag/Given 04/07/19 2000)  azithromycin (ZITHROMAX) 500 mg in sodium chloride 0.9 % 250 mL IVPB (has no administration in time range)  sodium chloride flush (NS) 0.9 % injection 3 mL (3 mLs Intravenous Given 04/07/19 1713)  ondansetron (ZOFRAN) injection 4 mg (4 mg Intravenous Given 04/07/19 1755)  sodium chloride 0.9 % bolus 1,000 mL (0 mLs Intravenous Stopped 04/07/19 1953)  morphine 4 MG/ML injection 4 mg (4 mg Intravenous Given 04/07/19 1819)  sodium chloride 0.9 % bolus 1,000 mL (1,000 mLs Intravenous New Bag/Given  04/07/19 1956)  LORazepam (ATIVAN) injection 2 mg (2 mg Intravenous Given 04/07/19 1955)     Initial Impression / Assessment and Plan / ED Course  I have reviewed the triage vital signs and the nursing notes.  Pertinent labs & imaging results that were available during my care of the patient were reviewed by me and considered in my medical decision making (see chart for  details).  Clinical Course as of Apr 06 2004  Tue Apr 07, 2019  1737 Pulse Rate(!): 119 [CG]  1737 WBC(!): 14.0 [CG]  1737 AST(!): 69 [CG]  1737 Alkaline Phosphatase(!): 159 [CG]  1737 Anion gap(!): 17 [CG]  1737 Glucose(!): 129 [CG]  1737 CO2(!): 18 [CG]  1737 BUN: 15 [CG]  1834 Ketones, ur(!): 80 [CG]  1834 Chalmers Guest): MODERATE [CG]  1834 RBC / HPF: 11-20 [CG]  1834 WBC, UA: 11-20 [CG]  1834 Bacteria, UA(!): MANY [CG]  1834 Squamous Epithelial / LPF: 0-5 [CG]  1834 IMPRESSION: 1. Mild left lower lobe pneumonia. 2. COPD.  DG Chest Portable 1 View [CG]  1902 Reevaluated patient.  While asleep heart rate is 104.  Continues to report nausea and feeling lightheaded.  I personally ambulated patient in the room and her heart rate went to 143, she was able to walk to the end of the bed twice but reported lightheadedness and had to sit down.  SPO2 remained greater than 95%.  Given tachycardia, rectal temperature obtained and it was 99.  Vitals otherwise reassuring.  Patient now more forthcoming about alcohol use.  States 2 nights ago her and her husband were taking shots of vodka.  Also states when she is feeling well she will usually drink every day.  Rarely takes Xanax for anxiety.   [CG]    Clinical Course User Index [CG] Kinnie Feil, PA-C   EMR reviewed to assist with MDM.    She has documented history of alcohol use, anxiety and depression on Xanax.  She recently had a sleep study September 2020 where she had nocturnal hypoxemia thought to be related to smoking cessation/COPD.  She is not on oxygen at home at this time.  Awaiting pulmonology evaluation.  Patient presents with similar symptoms including sore throat, cough, sputum, nausea, vomiting, epigastric abdominal burning, diarrhea.  She also reports chest pain, shortness of breath and lightheadedness on exertion.  Her husband had a bad cold 2 weeks ago.  Differential diagnosis is broad for this patient and includes  viral process like COVID-19 illness, influenza.  COPD exacerbation or pneumonia also a possibility.  No urinary symptoms but has diffuse lower abdominal suprapubic and CVA tenderness, UTI also but less likely. Diarrhea with abdominal tenderness with h/o diverticulitis raises concern for this as well but less likely given other infectious symptoms.  Given documented history of alcohol use and Xanax use, her tachycardia and symptoms could be related to this as well.  1750: ER work-up initiated in triage, reviewed and interpreted by me remarkable as above.  Leukocytosis.  LFTs slightly elevated but similar to previous.  Mild anion gap acidosis with WBC 14.0.  This could be due to dehydration related to alcohol use.  Glucose is normal.    We will add lactic acid, EtOH, CXR, COVID.  IV fluid, antiemetic, morphine.  1910: Pt re-evaluated.  Persistent tachycardia, light headedness, nausea and Sob on exertion despite IVF, antiemetics. No hypoxia.  She looks unwell.  UA and CXR both show infection.  With HR, leukocytosis she meets SIRS/Sepsis criteria.  Normal  BP and lactic acid.  Some of her tachycardia could be from ETOH/BZD withdrawal.  Will discuss with medicine for admission. Rocephin, azithromycin ordered. Non focal abd tenderness on re-evaluation, consider CTAP to evaluate for diverticulitis or infected renal stone although this was considered less likely given other infectious symptoms cough, sputum.  COVID pending.  Final Clinical Impressions(s) / ED Diagnoses   Final diagnoses:  Pyelonephritis  Community acquired pneumonia of left lower lobe of lung  Prolonged Q-T interval on ECG  SIRS (systemic inflammatory response syndrome) [facility])    ED Discharge Orders    None       Arlean Hopping 04/07/19 2008    Margette Fast, MD 04/07/19 2234

## 2019-04-07 NOTE — ED Notes (Signed)
Admitting Provider Marlowe Sax stated that second Lactic Acid Blood Specimen does not need to be drawn.

## 2019-04-07 NOTE — H&P (Addendum)
History and Physical    Vicki [name] IWO:[id number] DOB: 05-07-1966 DOA: 04/07/2019  PCP: Rutherford Guys, MD Patient coming from: Home  Chief Complaint: Diarrhea  HPI: Vicki [name] is a 53 y.o. female with medical history significant of asthma, COPD, diverticulitis, alcohol use, tobacco use, anxiety, depression presenting with complaints of diarrhea.  Patient states for the past 3 days she has been having yellow-colored watery diarrhea.  Denies recent antibiotic use.  Since yesterday she has been vomiting constantly and has not been able to tolerate any p.o. intake.  She is feeling weak and lightheaded.  She is also coughing a lot and having shortness of breath.  She is having a burning sensation in her chest and upper abdomen.  She gets a popping sensation in her ears and is supposed to see an ENT doctor.  No dysuria but her urine has been cloudy.  She is not having any fevers or chills.  States her husband had a cold 2 weeks ago from which she has now recovered.  He was not tested for COVID-19.  ED Course: Tachycardic.  Afebrile.  WBC count 14.0.  Lactic acid normal.  Blood glucose 129.  Bicarb 18, anion gap 17.  AST and alk phos mildly elevated.  ALT and T bili normal.  Lipase normal.  UA with ketones, protein, moderate amount of leukocytes, 11-20 RBCs, 11-20 WBCs, and many bacteria.  SARS-CoV-2 test pending.  Blood ethanol level undetectable.  Chest x-ray showing mild opacity at the left lung base.  Patient became lightheaded and tachycardic with ambulation but maintaining oxygen saturation around 98%. Patient received Ativan, morphine, Zofran, ceftriaxone, azithromycin, and 2 L normal saline boluses.  Review of Systems:  All systems reviewed and apart from history of presenting illness, are negative.  Past Medical History:  Diagnosis Date  . Asthma   . Carpal tunnel syndrome   . COPD (chronic obstructive pulmonary disease) ([place])   . Diverticulitis     Past Surgical  History:  Procedure Laterality Date  . ABDOMINAL HYSTERECTOMY  2009  . APPENDECTOMY    . ARTHROSCOPIC REPAIR ACL Left 2011   x 2  . BUNIONECTOMY  2006   both  . CARPAL TUNNEL RELEASE Bilateral 10/28/2013   Procedure: BILATERAL CARPAL TUNNEL RELEASE;  Surgeon: Wynonia Sours, MD;  Location: [facility];  Service: Orthopedics;  Laterality: Bilateral;  . DILATION AND CURETTAGE OF UTERUS    . TUBAL LIGATION  2004     reports that she has been smoking. She has a 17.00 pack-year smoking history. She has never used smokeless tobacco. She reports current alcohol use. She reports that she does not use drugs.  Allergies  Allergen Reactions  . Jasmine Oil Shortness Of Breath and Swelling  . Penicillins Anaphylaxis and Hives  . Aleve [Naproxen Sodium] Itching  . Benadryl [Diphenhydramine] Other (See Comments)    hallucinations  . Ceftin [Cefuroxime Axetil] Other (See Comments)    unsure  . Depo-Provera [Medroxyprogesterone Acetate] Other (See Comments)    Severe acne  . Doxycycline     Raises blood pressure   . Oxycodone Nausea And Vomiting  . Tramadol Other (See Comments)    Kept her up all night    Family History  Problem Relation Age of Onset  . Cancer Mother   . Cancer Sister 39       breast cancer  . Breast cancer Sister   . Breast cancer Sister     Prior to Admission medications  Medication Sig Start Date End Date Taking? Authorizing Provider  acetaminophen (TYLENOL) 325 MG tablet Take 650 mg by mouth every 6 (six) hours as needed.   Yes [provider]  albuterol (VENTOLIN HFA) 108 (90 Base) MCG/ACT inhaler Inhale 2 puffs into the lungs every 4 (four) hours as needed for wheezing or shortness of breath. 03/12/19  Yes Rutherford Guys, MD  ALPRAZolam Duanne Moron) 1 MG tablet TAKE 1/2 TO 1 TABLETS (0.5-1 MG TOTAL) BY MOUTH DAILY AS NEEDED FOR ANXIETY OR SLEEP. Patient taking differently: Take 0.5-1 mg by mouth daily as needed for anxiety or sleep.  03/12/19   Yes Rutherford Guys, MD  atorvastatin (LIPITOR) 20 MG tablet TAKE 1 TABLET BY MOUTH EVERY DAY Patient taking differently: Take 20 mg by mouth daily.  03/17/19  Yes Rutherford Guys, MD  bismuth subsalicylate (PEPTO BISMOL) 262 MG chewable tablet Chew 524 mg by mouth as needed for diarrhea or loose stools.   Yes [provider]  cetirizine (ZYRTEC) 10 MG tablet Take 1 tablet (10 mg total) by mouth daily. 07/19/17  Yes McVey, Gelene Mink, PA-C  citalopram (CELEXA) 20 MG tablet Take 3 tablets (60 mg total) by mouth daily. 03/12/19  Yes Rutherford Guys, MD  eletriptan (RELPAX) 40 MG tablet TAKE 1 TAB BY MOUTH AT ONSET OF MIGRAINE OR HEADACHE AS NEEDED. MAY REPEAT IN 2 HOURS IF PERSISTS Patient taking differently: Take 40 mg by mouth See admin instructions. Take 19m at onset of migraine or headache as needed. May repeat in 2 hours if symptoms persist. 03/12/19  Yes SRutherford Guys MD  Fluticasone-Salmeterol (ADVAIR DISKUS) 500-50 MCG/DOSE AEPB INHALE 1 PUFF INTO THE LUNGS TWICE A DAY Patient taking differently: Inhale 1 puff into the lungs 2 (two) times daily.  06/18/18  Yes McVey, EGelene Mink PA-C  gabapentin (NEURONTIN) 300 MG capsule Take 1 capsule (300 mg total) by mouth at bedtime. 03/12/19  Yes SRutherford Guys MD  glycopyrrolate (ROBINUL) 1 MG tablet TAKE 1 TABLET BY MOUTH EVERY EVENING Patient taking differently: Take 1 mg by mouth every evening.  10/19/18  Yes SRutherford Guys MD  guaiFENesin (ROBITUSSIN) 100 MG/5ML liquid Take 200 mg by mouth 3 (three) times daily as needed for cough.   Yes [provider]  ibuprofen (ADVIL) 800 MG tablet Take 1 tablet (800 mg total) by mouth every 8 (eight) hours as needed for mild pain. 12/18/18  Yes SRutherford Guys MD  ondansetron (ZOFRAN) 8 MG tablet Take 1 tablet (8 mg total) by mouth every 8 (eight) hours as needed for nausea or vomiting. 12/18/18  Yes SRutherford Guys MD  rOPINIRole (REQUIP) 1 MG tablet Take 1 tablet (1 mg  total) by mouth at bedtime. 06/16/18  Yes SRutherford Guys MD  traZODone (DESYREL) 50 MG tablet Take 0.5-1 tablets (25-50 mg total) by mouth at bedtime as needed for sleep. 09/18/17  Yes McVey, EGelene Mink PA-C  traZODone (DESYREL) 50 MG tablet TAKE 1 TABLET BY MOUTH EVERYDAY AT BEDTIME Patient taking differently: Take 50 mg by mouth at bedtime.  12/22/18  Yes SRutherford Guys MD  umeclidinium bromide (INCRUSE ELLIPTA) 62.5 MCG/INH AEPB Inhale 1 puff into the lungs daily. 09/17/18  Yes SRutherford Guys MD  tiotropium (SPIRIVA HANDIHALER) 18 MCG inhalation capsule Place 1 capsule (18 mcg total) into inhaler and inhale daily. Patient not taking: Reported on 04/07/2019 01/01/18   McVey, EGelene Mink PA-C  ibuprofen (ADVIL,MOTRIN) 800 MG tablet Take 1 tablet (800  mg total) by mouth every 8 (eight) hours as needed for mild pain. 04/22/18   Rutherford Guys, MD    Physical Exam: Vitals:   04/07/19 1953 04/07/19 2000 04/07/19 2015 04/07/19 2030  BP:  (!) 134/98 (!) 134/102 130/90  Pulse: (!) 105 (!) 108 (!) 108 (!) 108  Resp:  19 (!) 24 16  Temp:      TempSrc:      SpO2:  96% 97% 95%    Physical Exam  Constitutional: She is oriented to person, place, and time. She appears well-developed and well-nourished.  Appears lethargic  HENT:  Head: Normocephalic.  Dry mucous membranes  Eyes: Right eye exhibits no discharge. Left eye exhibits no discharge.  Neck: Neck supple.  Cardiovascular: Normal rate, regular rhythm and intact distal pulses.  Pulmonary/Chest: Effort normal and breath sounds normal. No respiratory distress. She has no wheezes. She has no rales.  Abdominal: Soft. Bowel sounds are normal. She exhibits no distension. There is abdominal tenderness. There is no rebound and no guarding.  Mild generalized tenderness to palpation  Musculoskeletal:        General: No edema.  Neurological: She is alert and oriented to person, place, and time.  Skin: Skin is warm and dry. She is  not diaphoretic.     Labs on Admission: I have personally reviewed following labs and imaging studies  CBC: Recent Labs  Lab 04/07/19 1502  WBC 14.0*  HGB 15.8*  HCT 45.6  MCV 91.9  PLT 443   Basic Metabolic Panel: Recent Labs  Lab 04/07/19 1502  NA 137  K 4.0  CL 102  CO2 18*  GLUCOSE 129*  BUN 15  CREATININE 0.92  CALCIUM 8.7*   GFR: CrCl cannot be calculated (Unknown ideal weight.). Liver Function Tests: Recent Labs  Lab 04/07/19 1502  AST 69*  ALT 32  ALKPHOS 159*  BILITOT 1.2  PROT 7.6  ALBUMIN 4.3   Recent Labs  Lab 04/07/19 1502  LIPASE 25   No results for input(s): AMMONIA in the last 168 hours. Coagulation Profile: No results for input(s): INR, PROTIME in the last 168 hours. Cardiac Enzymes: No results for input(s): CKTOTAL, CKMB, CKMBINDEX, TROPONINI in the last 168 hours. BNP (last 3 results) No results for input(s): PROBNP in the last 8760 hours. HbA1C: No results for input(s): HGBA1C in the last 72 hours. CBG: No results for input(s): GLUCAP in the last 168 hours. Lipid Profile: No results for input(s): CHOL, HDL, LDLCALC, TRIG, CHOLHDL, LDLDIRECT in the last 72 hours. Thyroid Function Tests: No results for input(s): TSH, T4TOTAL, FREET4, T3FREE, THYROIDAB in the last 72 hours. Anemia Panel: No results for input(s): VITAMINB12, FOLATE, FERRITIN, TIBC, IRON, RETICCTPCT in the last 72 hours. Urine analysis:    Component Value Date/Time   COLORURINE AMBER (A) 04/07/2019 1743   APPEARANCEUR CLOUDY (A) 04/07/2019 1743   LABSPEC 1.024 04/07/2019 1743   PHURINE 5.0 04/07/2019 1743   GLUCOSEU NEGATIVE 04/07/2019 1743   HGBUR LARGE (A) 04/07/2019 1743   BILIRUBINUR NEGATIVE 04/07/2019 1743   BILIRUBINUR negative 01/17/2018 0951   BILIRUBINUR small 07/19/2017 1225   KETONESUR 80 (A) 04/07/2019 1743   PROTEINUR 100 (A) 04/07/2019 1743   UROBILINOGEN 0.2 01/17/2018 0951   UROBILINOGEN 0.2 04/26/2014 0904   NITRITE NEGATIVE 04/07/2019  1743   LEUKOCYTESUR MODERATE (A) 04/07/2019 1743    Radiological Exams on Admission: Dg Chest Portable 1 View  Result Date: 04/07/2019 CLINICAL DATA:  Cough. EXAM: PORTABLE CHEST 1 VIEW COMPARISON:  Chest x-ray dated July 01, 2018. FINDINGS: The heart size and mediastinal contours are within normal limits. Normal pulmonary vascularity. The lungs remain hyperinflated. New mild opacity at the left lung base. No pleural effusion or pneumothorax. No acute osseous abnormality. IMPRESSION: 1. Mild left lower lobe pneumonia. 2. COPD. Electronically Signed   By: Titus Dubin M.D.   On: 04/07/2019 18:17    EKG: Independently reviewed.  Sinus tachycardia, heart rate 131.  QTc 561.  Assessment/Plan Principal Problem:   CAP (community acquired pneumonia) Active Problems:   UTI (urinary tract infection)   SIRS (systemic inflammatory response syndrome) (HCC)   Suspected COVID-19 virus infection   Viral gastroenteritis   CAP, SIRS, concern for COVID-19 viral pneumonia Afebrile.  WBC count 14.0.  Lactic acid normal.  Patient is tachycardic but not hypoxic.  Chest x-ray showing mild opacity at the left lung base concerning for pneumonia.  Concern for COVID-19 viral infection given constellation of symptoms consistent with a viral illness. -SARS-CoV-2 test pending.  Continue airborne and contact precautions. -Ceftriaxone and azithromycin ordered in the ED for CAP.  Penicillin allergy listed.  Discussed with pharmacy.  Patient has received ceftriaxone in the ED without any adverse reaction.  I have discontinued azithromycin due to significant QT prolongation on EKG.  Will continue antibiotic coverage with ceftriaxone as patient is tolerating it well and add doxycycline.  Check procalcitonin level. -Continuous pulse ox, supplemental oxygen if needed to keep oxygen saturation above 92% -Continue to monitor WBC count  Addendum: SARS-CoV-2 test negative, however, there remains concern for COVID-19  infection.  Procalcitonin <0.10 making bacterial infection less likely.  Continue airborne and contact precautions.  Check inflammatory markers and chest CTA (also rule out PE given persistent tachycardia).  UTI UA with signs of infection.  Does have mild leukocytosis. -Ceftriaxone -Urine culture  Viral gastroenteritis Patient is complaining of emesis and diarrhea.  Has mild generalized tenderness on abdominal exam and no peritoneal signs.  She has been exposed to a family member who recently had a viral illness. AST and alk phos mildly elevated.  ALT and T bili normal.  Lipase normal.  -IV fluid hydration -Compazine as needed for nausea/vomiting -GI pathogen panel -Continue to monitor LFTs  Addendum: Also check C. difficile PCR panel given leukocytosis  High anion gap metabolic acidosis Bicarb 18, anion gap 17.  No lactic acidosis. -IV fluid hydration -Continue to monitor electrolyte panel  Possible mild acute alcohol withdrawal Patient reported to ED provider that she drinks alcohol daily.  Blood ethanol level undetectable.  Currently tachycardic which could be secondary to alcohol withdrawal but can also be attributed to severe dehydration/acute viral illness. -CIWA protocol; Ativan as needed -Thiamine, folate, multivitamin  Tobacco use -NicoDerm patch -Counseling  QT prolongation on EKG QTC 561. -Cardiac monitoring -Avoid QT prolonging drugs possible -Keep potassium above 4 and magnesium above 2 -Repeat EKG in a.m.  DVT prophylaxis: Lovenox Code Status: Full code Family Communication: No family available. Disposition Plan: Anticipate discharge after clinical improvement. Consults called: None Admission status: It is my clinical opinion that admission to INPATIENT is reasonable and necessary in this 53 y.o. female . presenting with CAP, UTI, and symptoms concerning for COVID-19 viral infection.  SARS-CoV-2 test pending.  Receiving antibiotics and IV fluid.  Given the  aforementioned, the predictability of an adverse outcome is felt to be significant. I expect that the patient will require at least 2 midnights in the hospital to treat this condition.   The medical decision making  on this patient was of high complexity and the patient is at high risk for clinical deterioration, therefore this is a level 3 visit.  Shela Leff MD [facility] Pager [phone]  If 7PM-7AM, please contact night-coverage [email or web address] Password [facility]  04/07/2019, 9:07 PM

## 2019-04-08 ENCOUNTER — Ambulatory Visit: Payer: 59

## 2019-04-08 ENCOUNTER — Inpatient Hospital Stay (HOSPITAL_COMMUNITY): Payer: 59

## 2019-04-08 ENCOUNTER — Encounter (HOSPITAL_COMMUNITY): Payer: Self-pay | Admitting: Radiology

## 2019-04-08 DIAGNOSIS — N12 Tubulo-interstitial nephritis, not specified as acute or chronic: Secondary | ICD-10-CM

## 2019-04-08 DIAGNOSIS — Z20828 Contact with and (suspected) exposure to other viral communicable diseases: Secondary | ICD-10-CM

## 2019-04-08 DIAGNOSIS — R9431 Abnormal electrocardiogram [ECG] [EKG]: Secondary | ICD-10-CM

## 2019-04-08 LAB — CBC
HCT: 37.8 % (ref 36.0–46.0)
Hemoglobin: 12.9 g/dL (ref 12.0–15.0)
MCH: 31.4 pg (ref 26.0–34.0)
MCHC: 34.1 g/dL (ref 30.0–36.0)
MCV: 92 fL (ref 80.0–100.0)
Platelets: 182 10*3/uL (ref 150–400)
RBC: 4.11 MIL/uL (ref 3.87–5.11)
RDW: 12.3 % (ref 11.5–15.5)
WBC: 9.7 10*3/uL (ref 4.0–10.5)
nRBC: 0 % (ref 0.0–0.2)

## 2019-04-08 LAB — RESPIRATORY PANEL BY PCR

## 2019-04-08 LAB — GASTROINTESTINAL PANEL BY PCR, STOOL (REPLACES STOOL CULTURE)

## 2019-04-08 LAB — COMPREHENSIVE METABOLIC PANEL
ALT: 27 U/L (ref 0–44)
AST: 58 U/L — ABNORMAL HIGH (ref 15–41)
Albumin: 3.5 g/dL (ref 3.5–5.0)
Alkaline Phosphatase: 132 U/L — ABNORMAL HIGH (ref 38–126)
Anion gap: 13 (ref 5–15)
BUN: 10 mg/dL (ref 6–20)
CO2: 20 mmol/L — ABNORMAL LOW (ref 22–32)
Calcium: 7.5 mg/dL — ABNORMAL LOW (ref 8.9–10.3)
Chloride: 103 mmol/L (ref 98–111)
Creatinine, Ser: 0.97 mg/dL (ref 0.44–1.00)
GFR calc Af Amer: >60 mL/min (ref >60)
GFR calc non Af Amer: >60 mL/min (ref >60)
Glucose, Bld: 101 mg/dL — ABNORMAL HIGH (ref 70–99)
Potassium: 4.3 mmol/L (ref 3.5–5.1)
Sodium: 136 mmol/L (ref 135–145)
Total Bilirubin: 1.2 mg/dL (ref 0.3–1.2)
Total Protein: 6.5 g/dL (ref 6.5–8.1)

## 2019-04-08 LAB — C DIFFICILE QUICK SCREEN W PCR REFLEX
C Diff antigen: NEGATIVE
C Diff interpretation: NOT DETECTED
C Diff toxin: NEGATIVE

## 2019-04-08 LAB — C-REACTIVE PROTEIN: CRP: 2.3 mg/dL — ABNORMAL HIGH (ref <1.0)

## 2019-04-08 LAB — D-DIMER, QUANTITATIVE: D-Dimer, Quant: 1.38 ug/mL-FEU — ABNORMAL HIGH (ref 0.00–0.50)

## 2019-04-08 LAB — PHOSPHORUS: Phosphorus: 2.5 mg/dL (ref 2.5–4.6)

## 2019-04-08 LAB — FIBRINOGEN: Fibrinogen: 267 mg/dL (ref 210–475)

## 2019-04-08 LAB — MAGNESIUM: Magnesium: 1.2 mg/dL — ABNORMAL LOW (ref 1.7–2.4)

## 2019-04-08 LAB — FERRITIN: Ferritin: 673 ng/mL — ABNORMAL HIGH (ref 11–307)

## 2019-04-08 LAB — HIV ANTIBODY (ROUTINE TESTING W REFLEX): HIV Screen 4th Generation wRfx: NONREACTIVE

## 2019-04-08 LAB — LACTATE DEHYDROGENASE: LDH: 217 U/L — ABNORMAL HIGH (ref 98–192)

## 2019-04-08 MED ORDER — ALPRAZOLAM 0.5 MG PO TABS: 0.5000 mg | ORAL_TABLET | Freq: Two times a day (BID) | ORAL | Status: DC | PRN

## 2019-04-08 MED ORDER — IOHEXOL 350 MG/ML SOLN
80.0000 mL | Freq: Once | INTRAVENOUS | Status: AC | PRN
  Administered 2019-04-08: 80 mL via INTRAVENOUS

## 2019-04-08 MED ORDER — ALPRAZOLAM 0.5 MG PO TABS
0.5000 mg | ORAL_TABLET | Freq: Once | ORAL | Status: AC
  Administered 2019-04-08: 1 mg via ORAL
  Filled 2019-04-08: qty 2

## 2019-04-08 MED ORDER — MAGNESIUM SULFATE 4 GM/100ML IV SOLN
4.0000 g | Freq: Once | INTRAVENOUS | Status: AC
  Administered 2019-04-08: 4 g via INTRAVENOUS
  Filled 2019-04-08: qty 100

## 2019-04-08 NOTE — Progress Notes (Signed)
PROGRESS NOTE    Vicki [name]  CEY:[id number] DOB: 01/26/1966 DOA: 04/07/2019 PCP: Rutherford Guys, MD    Brief Narrative:  53 y.o. female with medical history significant of asthma, COPD, diverticulitis, alcohol use, tobacco use, anxiety, depression presenting with complaints of diarrhea.  Patient states for the past 3 days she has been having yellow-colored watery diarrhea.  Denies recent antibiotic use.  Since yesterday she has been vomiting constantly and has not been able to tolerate any p.o. intake.  She is feeling weak and lightheaded.  She is also coughing a lot and having shortness of breath.  She is having a burning sensation in her chest and upper abdomen.  She gets a popping sensation in her ears and is supposed to see an ENT doctor.  No dysuria but her urine has been cloudy.  She is not having any fevers or chills.  States her husband had a cold 2 weeks ago from which she has now recovered.  He was not tested for COVID-19.  ED Course: Tachycardic.  Afebrile.  WBC count 14.0.  Lactic acid normal.  Blood glucose 129.  Bicarb 18, anion gap 17.  AST and alk phos mildly elevated.  ALT and T bili normal.  Lipase normal.  UA with ketones, protein, moderate amount of leukocytes, 11-20 RBCs, 11-20 WBCs, and many bacteria.  SARS-CoV-2 test pending.  Blood ethanol level undetectable.  Chest x-ray showing mild opacity at the left lung base.  Patient became lightheaded and tachycardic with ambulation but maintaining oxygen saturation around 98%. Patient received Ativan, morphine, Zofran, ceftriaxone, azithromycin, and 2 L normal saline boluses.  Assessment & Plan:   Principal Problem:   CAP (community acquired pneumonia) Active Problems:   UTI (urinary tract infection)   SIRS (systemic inflammatory response syndrome) ([place])   Suspected COVID-19 virus infection   Viral gastroenteritis  Suspected viral illness -Pt currently afebrile -Presenting WBC count 14.0.  Lactic acid normal.   Patient is tachycardic but not hypoxic.   -Initial chest x-ray suggestive of mild opacity at the left lung base concerning for pneumonia.   -Initial concern for COVID-19 viral infection, however COVID test neg -Pt is continued on empiric ceftriaxone and doxycycline.  -procalcitonin level neg  UTI -UA with signs of infection.  Does have mild leukocytosis. -Continue Ceftriaxone -Urine culture pending  Viral gastroenteritis -Patient is complaining of emesis and diarrhea.  Has mild generalized tenderness on abdominal exam and no peritoneal signs.  She has been exposed to a family member who recently had a viral illness. AST and alk phos mildly elevated.  ALT and T bili normal.  Lipase normal.  -cont IV fluid hydration -Compazine as needed for nausea/vomiting -GI pathogen panel neg -Continue to monitor LFTs -Will check respiratory viral panel which includes flu  High anion gap metabolic acidosis -Bicarb 18, anion gap 17.   -No lactic acidosis. -continue IV fluid hydration -Continue to monitor electrolyte panel  Possible mild acute alcohol withdrawal Patient reported to ED provider that she drinks alcohol daily.  Blood ethanol level undetectable.   -Continue CIWA protocol; Ativan as needed -Thiamine, folate, multivitamin  Tobacco use -NicoDerm patch -cessation recommended  QT prolongation on EKG QTC 561. -Cardiac monitoring -Avoid QT prolonging drugs possible -Recommend potassium above 4 and magnesium above 2 -Repeat EKG in a.m.  DVT prophylaxis: Lovenox subQ Code Status: Full Family Communication: Pt in room, family not at bedside Disposition Plan: Uncertain at this time  Consultants:     Procedures:  Antimicrobials: Anti-infectives (From admission, onward)   Start     Dose/Rate Route Frequency Ordered Stop   04/07/19 2100  doxycycline (VIBRAMYCIN) 100 mg in sodium chloride 0.9 % 250 mL IVPB     100 mg 125 mL/hr over 120 Minutes Intravenous Every 12  hours 04/07/19 2049     04/07/19 1915  cefTRIAXone (ROCEPHIN) 1 g in sodium chloride 0.9 % 100 mL IVPB     1 g 200 mL/hr over 30 Minutes Intravenous  Once 04/07/19 1905 04/07/19 2041   04/07/19 1915  azithromycin (ZITHROMAX) 500 mg in sodium chloride 0.9 % 250 mL IVPB  Status:  Discontinued     500 mg 250 mL/hr over 60 Minutes Intravenous  Once 04/07/19 1905 04/07/19 2040   04/07/19 1900  cefTRIAXone (ROCEPHIN) 1 g in sodium chloride 0.9 % 100 mL IVPB     1 g 200 mL/hr over 30 Minutes Intravenous Every 24 hours 04/07/19 2049         Subjective: Still feeling nauseated and coughing. Diarrhea seems somewhat better  Objective: Vitals:   04/08/19 0322 04/08/19 0903 04/08/19 1213 04/08/19 1621  BP: (!) 141/93 (!) 135/96 (!) 137/100 (!) 139/97  Pulse: (!) 102 (!) 105 (!) 105 82  Resp: '18  20 16  ' Temp: 98.4 F (36.9 C) 97.7 F (36.5 C) 98.4 F (36.9 C) 98.9 F (37.2 C)  TempSrc: Oral Oral Oral Oral  SpO2: 95% 95% 97% 95%  Weight: 71.6 kg     Height:        Intake/Output Summary (Last 24 hours) at 04/08/2019 1649 Last data filed at 04/08/2019 1628 Gross per 24 hour  Intake 2738.4 ml  Output 1603 ml  Net 1135.4 ml   Filed Weights   04/08/19 0015 04/08/19 0322  Weight: 72 kg 71.6 kg    Examination:  General exam: Appears calm and comfortable  Respiratory system: Clear to auscultation. Respiratory effort normal. Cardiovascular system: S1 & S2 heard, regular Gastrointestinal system: Abdomen is nondistended, soft and nontender. No organomegaly or masses felt. Normal bowel sounds heard. Central nervous system: Alert and oriented. No focal neurological deficits. Extremities: Symmetric 5 x 5 power. Skin: No rashes, lesions Psychiatry: Judgement and insight appear normal. Mood & affect appropriate.   Data Reviewed: I have personally reviewed following labs and imaging studies  CBC: Recent Labs  Lab 04/07/19 1502 04/08/19 0049  WBC 14.0* 9.7  HGB 15.8* 12.9  HCT 45.6  37.8  MCV 91.9 92.0  PLT 240 119   Basic Metabolic Panel: Recent Labs  Lab 04/07/19 1502 04/07/19 2214 04/08/19 0049  NA 137  --  136  K 4.0  --  4.3  CL 102  --  103  CO2 18*  --  20*  GLUCOSE 129*  --  101*  BUN 15  --  10  CREATININE 0.92  --  0.97  CALCIUM 8.7*  --  7.5*  MG  --  1.6* 1.2*  PHOS  --   --  2.5   GFR: Estimated Creatinine Clearance: 65.1 mL/min (by C-G formula based on SCr of 0.97 mg/dL). Liver Function Tests: Recent Labs  Lab 04/07/19 1502 04/08/19 0049  AST 69* 58*  ALT 32 27  ALKPHOS 159* 132*  BILITOT 1.2 1.2  PROT 7.6 6.5  ALBUMIN 4.3 3.5   Recent Labs  Lab 04/07/19 1502  LIPASE 25   No results for input(s): AMMONIA in the last 168 hours. Coagulation Profile: No results for input(s): INR, PROTIME in the  last 168 hours. Cardiac Enzymes: No results for input(s): CKTOTAL, CKMB, CKMBINDEX, TROPONINI in the last 168 hours. BNP (last 3 results) No results for input(s): PROBNP in the last 8760 hours. HbA1C: No results for input(s): HGBA1C in the last 72 hours. CBG: No results for input(s): GLUCAP in the last 168 hours. Lipid Profile: No results for input(s): CHOL, HDL, LDLCALC, TRIG, CHOLHDL, LDLDIRECT in the last 72 hours. Thyroid Function Tests: No results for input(s): TSH, T4TOTAL, FREET4, T3FREE, THYROIDAB in the last 72 hours. Anemia Panel: Recent Labs    04/08/19 0049  FERRITIN 673*   Sepsis Labs: Recent Labs  Lab 04/07/19 1749 04/07/19 2214  PROCALCITON  --  <0.10  LATICACIDVEN 1.7  --     Recent Results (from the past 240 hour(s))  SARS CORONAVIRUS 2 (TAT 6-24 HRS) Nasopharyngeal Nasopharyngeal Swab     Status: None   Collection Time: 04/07/19  5:44 PM   Specimen: Nasopharyngeal Swab  Result Value Ref Range Status   SARS Coronavirus 2 NEGATIVE NEGATIVE Final    Comment: (NOTE) SARS-CoV-2 target nucleic acids are NOT DETECTED. The SARS-CoV-2 RNA is generally detectable in upper and lower respiratory specimens  during the acute phase of infection. Negative results do not preclude SARS-CoV-2 infection, do not rule out co-infections with other pathogens, and should not be used as the sole basis for treatment or other patient management decisions. Negative results must be combined with clinical observations, patient history, and epidemiological information. The expected result is Negative. Fact Sheet for Patients: SugarRoll.be Fact Sheet for Healthcare Providers: https://www.woods-mathews.com/ This test is not yet approved or cleared by the [place] FDA and  has been authorized for detection and/or diagnosis of SARS-CoV-2 by FDA under an Emergency Use Authorization (EUA). This EUA will remain  in effect (meaning this test can be used) for the duration of the COVID-19 declaration under Section 56 4(b)(1) of the Act, 21 U.S.C. section 360bbb-3(b)(1), unless the authorization is terminated or revoked sooner. Performed at [facility], [place] [address] [place], [place] [address]   Gastrointestinal Panel by PCR , Stool     Status: None   Collection Time: 04/07/19 11:38 PM   Specimen: Stool  Result Value Ref Range Status   Campylobacter species NOT DETECTED NOT DETECTED Final   Plesimonas shigelloides NOT DETECTED NOT DETECTED Final   Salmonella species NOT DETECTED NOT DETECTED Final   Yersinia enterocolitica NOT DETECTED NOT DETECTED Final   Vibrio species NOT DETECTED NOT DETECTED Final   Vibrio cholerae NOT DETECTED NOT DETECTED Final   Enteroaggregative E coli (EAEC) NOT DETECTED NOT DETECTED Final   Enteropathogenic E coli (EPEC) NOT DETECTED NOT DETECTED Final   Enterotoxigenic E coli (ETEC) NOT DETECTED NOT DETECTED Final   Shiga like toxin producing E coli (STEC) NOT DETECTED NOT DETECTED Final   Shigella/Enteroinvasive E coli (EIEC) NOT DETECTED NOT DETECTED Final   Cryptosporidium NOT DETECTED NOT DETECTED Final   Cyclospora  cayetanensis NOT DETECTED NOT DETECTED Final   Entamoeba histolytica NOT DETECTED NOT DETECTED Final   Giardia lamblia NOT DETECTED NOT DETECTED Final   Adenovirus F40/41 NOT DETECTED NOT DETECTED Final   Astrovirus NOT DETECTED NOT DETECTED Final   Norovirus GI/GII NOT DETECTED NOT DETECTED Final   Rotavirus A NOT DETECTED NOT DETECTED Final   Sapovirus (I, II, IV, and V) NOT DETECTED NOT DETECTED Final    Comment: Performed at [facility], [address] [place],  [address]  C difficile quick scan  w PCR reflex     Status: None   Collection Time: 04/07/19 11:38 PM   Specimen: STOOL  Result Value Ref Range Status   C Diff antigen NEGATIVE NEGATIVE Final   C Diff toxin NEGATIVE NEGATIVE Final   C Diff interpretation No C. difficile detected.  Final    Comment: Performed at [facility], [place] [address], [place], [place] [address]     Radiology Studies: Ct Angio Chest Pe W Or Wo Contrast  Result Date: 04/08/2019 CLINICAL DATA:  Shortness of breath and possible pneumonia EXAM: CT ANGIOGRAPHY CHEST WITH CONTRAST TECHNIQUE: Multidetector CT imaging of the chest was performed using the standard protocol during bolus administration of intravenous contrast. Multiplanar CT image reconstructions and MIPs were obtained to evaluate the vascular anatomy. CONTRAST:  30m OMNIPAQUE IOHEXOL 350 MG/ML SOLN COMPARISON:  None. FINDINGS: Cardiovascular: --Pulmonary arteries: Contrast injection is sufficient to demonstrate satisfactory opacification of the pulmonary arteries to the segmental level, with attenuation of at least 200 HU at the main pulmonary artery. There is no pulmonary embolus. The main pulmonary artery is within normal limits for size. --Aorta: Satisfactory opacification of the thoracic aorta. No aortic dissection or other acute aortic syndrome. Conventional 3 vessel aortic branching pattern. There is no aortic atherosclerosis. --Heart: Normal size. No pericardial effusion.  Mediastinum/Nodes: No mediastinal, hilar or axillary lymphadenopathy. The visualized thyroid and thoracic esophageal course are unremarkable. Lungs/Pleura: No pulmonary nodules or masses. No pleural effusion or pneumothorax. There is right basilar atelectasis. No focal airspace consolidation. No focal pleural abnormality. Upper Abdomen: Contrast bolus timing is not optimized for evaluation of the abdominal organs. The visualized portions of the organs of the upper abdomen are normal. Musculoskeletal: No chest wall abnormality. No bony spinal canal stenosis. Review of the MIP images confirms the above findings. IMPRESSION: 1. No pulmonary embolus or acute aortic syndrome. 2. Right basilar atelectasis but otherwise clear lungs. Electronically Signed   By: KUlyses JarredM.D.   On: 04/08/2019 02:40   Dg Chest Portable 1 View  Result Date: 04/07/2019 CLINICAL DATA:  Cough. EXAM: PORTABLE CHEST 1 VIEW COMPARISON:  Chest x-ray dated July 01, 2018. FINDINGS: The heart size and mediastinal contours are within normal limits. Normal pulmonary vascularity. The lungs remain hyperinflated. New mild opacity at the left lung base. No pleural effusion or pneumothorax. No acute osseous abnormality. IMPRESSION: 1. Mild left lower lobe pneumonia. 2. COPD. Electronically Signed   By: WTitus DubinM.D.   On: 04/07/2019 18:17    Scheduled Meds:  enoxaparin (LOVENOX) injection  40 mg Subcutaneous [id number]  folic acid  1 mg Oral Daily   multivitamin with minerals  1 tablet Oral Daily   nicotine  14 mg Transdermal Daily   thiamine  100 mg Oral Daily   Or   thiamine  100 mg Intravenous Daily   Continuous Infusions:  cefTRIAXone (ROCEPHIN)  IV     doxycycline (VIBRAMYCIN) IV 100 mg (04/08/19 0923)     LOS: 1 day   SMarylu Lund MD [facility] Pager On Amion  If 7PM-7AM, please contact night-coverage 04/08/2019, 4:49 PM

## 2019-04-08 NOTE — Plan of Care (Signed)

## 2019-04-09 LAB — COMPREHENSIVE METABOLIC PANEL
ALT: 19 U/L (ref 0–44)
AST: 34 U/L (ref 15–41)
Albumin: 3.2 g/dL — ABNORMAL LOW (ref 3.5–5.0)
Alkaline Phosphatase: 125 U/L (ref 38–126)
Anion gap: 9 (ref 5–15)
BUN: <5 mg/dL — ABNORMAL LOW (ref 6–20)
CO2: 22 mmol/L (ref 22–32)
Calcium: 8.1 mg/dL — ABNORMAL LOW (ref 8.9–10.3)
Chloride: 107 mmol/L (ref 98–111)
Creatinine, Ser: 0.68 mg/dL (ref 0.44–1.00)
GFR calc Af Amer: >60 mL/min (ref >60)
GFR calc non Af Amer: >60 mL/min (ref >60)
Glucose, Bld: 100 mg/dL — ABNORMAL HIGH (ref 70–99)
Potassium: 3.9 mmol/L (ref 3.5–5.1)
Sodium: 138 mmol/L (ref 135–145)
Total Bilirubin: 1.1 mg/dL (ref 0.3–1.2)
Total Protein: 6 g/dL — ABNORMAL LOW (ref 6.5–8.1)

## 2019-04-09 LAB — CBC
HCT: 37 % (ref 36.0–46.0)
Hemoglobin: 12.5 g/dL (ref 12.0–15.0)
MCH: 31.5 pg (ref 26.0–34.0)
MCHC: 33.8 g/dL (ref 30.0–36.0)
MCV: 93.2 fL (ref 80.0–100.0)
Platelets: 138 10*3/uL — ABNORMAL LOW (ref 150–400)
RBC: 3.97 MIL/uL (ref 3.87–5.11)
RDW: 12.4 % (ref 11.5–15.5)
WBC: 6.5 10*3/uL (ref 4.0–10.5)
nRBC: 0 % (ref 0.0–0.2)

## 2019-04-09 LAB — URINE CULTURE: Culture: >=100000 — AB

## 2019-04-09 LAB — MAGNESIUM: Magnesium: 1.6 mg/dL — ABNORMAL LOW (ref 1.7–2.4)

## 2019-04-09 MED ORDER — SODIUM CHLORIDE 0.9 % IV SOLN
100.0000 mg | Freq: Two times a day (BID) | INTRAVENOUS | Status: DC
  Administered 2019-04-10: 100 mg via INTRAVENOUS
  Filled 2019-04-09 (×3): qty 100

## 2019-04-09 MED ORDER — DIPHENHYDRAMINE HCL 50 MG/ML IJ SOLN
25.0000 mg | INTRAMUSCULAR | Status: AC
  Administered 2019-04-09: 25 mg via INTRAVENOUS
  Filled 2019-04-09: qty 1

## 2019-04-09 MED ORDER — IPRATROPIUM-ALBUTEROL 0.5-2.5 (3) MG/3ML IN SOLN
RESPIRATORY_TRACT | Status: AC
  Administered 2019-04-09: 3 mL
  Filled 2019-04-09: qty 3

## 2019-04-09 MED ORDER — NITROFURANTOIN MONOHYD MACRO 100 MG PO CAPS
100.0000 mg | ORAL_CAPSULE | Freq: Two times a day (BID) | ORAL | Status: DC
  Administered 2019-04-10: 100 mg via ORAL
  Filled 2019-04-09 (×3): qty 1

## 2019-04-09 MED ORDER — LEVOFLOXACIN 250 MG PO TABS
250.0000 mg | ORAL_TABLET | Freq: Every day | ORAL | Status: DC
  Administered 2019-04-09: 250 mg via ORAL
  Filled 2019-04-09: qty 1

## 2019-04-09 MED ORDER — METHYLPREDNISOLONE SODIUM SUCC 40 MG IJ SOLR
40.0000 mg | INTRAMUSCULAR | Status: AC
  Administered 2019-04-09: 40 mg via INTRAVENOUS
  Filled 2019-04-09: qty 1

## 2019-04-09 MED ORDER — MAGNESIUM SULFATE 4 GM/100ML IV SOLN
4.0000 g | Freq: Once | INTRAVENOUS | Status: AC
  Administered 2019-04-09: 4 g via INTRAVENOUS
  Filled 2019-04-09: qty 100

## 2019-04-09 NOTE — Progress Notes (Signed)
PROGRESS NOTE    Vicki [name]  TKZ:[id number] DOB: 12-30-1965 DOA: 04/07/2019 PCP: Rutherford Guys, MD    Brief Narrative:  53 y.o. female with medical history significant of asthma, COPD, diverticulitis, alcohol use, tobacco use, anxiety, depression presenting with complaints of diarrhea.  Patient states for the past 3 days she has been having yellow-colored watery diarrhea.  Denies recent antibiotic use.  Since yesterday she has been vomiting constantly and has not been able to tolerate any p.o. intake.  She is feeling weak and lightheaded.  She is also coughing a lot and having shortness of breath.  She is having a burning sensation in her chest and upper abdomen.  She gets a popping sensation in her ears and is supposed to see an ENT doctor.  No dysuria but her urine has been cloudy.  She is not having any fevers or chills.  States her husband had a cold 2 weeks ago from which she has now recovered.  He was not tested for COVID-19.  ED Course: Tachycardic.  Afebrile.  WBC count 14.0.  Lactic acid normal.  Blood glucose 129.  Bicarb 18, anion gap 17.  AST and alk phos mildly elevated.  ALT and T bili normal.  Lipase normal.  UA with ketones, protein, moderate amount of leukocytes, 11-20 RBCs, 11-20 WBCs, and many bacteria.  SARS-CoV-2 test pending.  Blood ethanol level undetectable.  Chest x-ray showing mild opacity at the left lung base.  Patient became lightheaded and tachycardic with ambulation but maintaining oxygen saturation around 98%. Patient received Ativan, morphine, Zofran, ceftriaxone, azithromycin, and 2 L normal saline boluses.  Assessment & Plan:   Principal Problem:   CAP (community acquired pneumonia) Active Problems:   UTI (urinary tract infection)   SIRS (systemic inflammatory response syndrome) ([place])   Suspected COVID-19 virus infection   Viral gastroenteritis  Suspected viral illness -Pt currently afebrile -Presenting WBC count 14.0.  Lactic acid normal.    -Initial chest x-ray suggestive of mild opacity at the left lung base concerning for pneumonia.   -Initial concern for COVID-19 viral infection, however COVID test neg -Pt is continued on empiric ceftriaxone and doxycycline. Rocephin d/c'd given resistant ecoli UTI per below -procalcitonin level neg -Viral panel including Flu neg  ESBL ecoli UTI with sepsis present on admit -UA with signs of infection.  Does have mild leukocytosis per above -Initially continued with Ceftriaxone -Urine culture demonstrates ecoli resistant to ampicillin, cefazolin, rocephin, bactrim -Given trial of levaquin, however pt acutely developed itching, sob, wheezing with concerns of allergic reaction. Given IV benadryl and IV solumedrol -Will try nitrofurantoin. If no improvement, then consider meropenem  Viral gastroenteritis -Patient presented with complaints of emesis and diarrhea.  Has mild generalized tenderness on abdominal exam and no peritoneal signs.  She has been exposed to a family member who recently had a suspected viral illness. AST and alk phos mildly elevated.  ALT and T bili normal.  Lipase normal.  -cont IV fluid hydration -Compazine as needed for nausea/vomiting -GI pathogen panel neg -Continue to monitor LFTs -Pt reports diarrhea is becoming more formed  High anion gap metabolic acidosis -Bicarb 18, anion gap 17.   -No lactic acidosis. -continue IV fluid hydration -Acidosis resolved with f/u serum bicarb of 22  Possible mild acute alcohol withdrawal Patient reported to ED provider that she drinks alcohol daily.  Blood ethanol level undetectable on presentation.   -Continue CIWA protocol; Ativan as needed -Thiamine, folate, multivitamin  Tobacco use -NicoDerm patch -  cessation recommended °  °QT prolongation on EKG QTC 561 on presentation °-Cardiac monitoring °-Electrolytes corrected °-Repeat EKG in a.m. reviewed, resolved with QTc in the 440's ° °Allergic reaction °-Allergy  list was reviewed prior to giving trial of levaquin °-See above. Pt with concerns °-Per RN, pt rapidly developed sob, wheezing, itching shortly after receiving levaquin.  °-Given IV benadryl and IV solumedrol °-Will add levaquin to allergy list °  °DVT prophylaxis: Lovenox subQ °Code Status: Full °Family Communication: Pt in room, family not at bedside °Disposition Plan: Uncertain at this time ° °Consultants:  °·  ° °Procedures:  °·  ° °Antimicrobials: °Anti-infectives (From admission, onward)  ° Start     Dose/Rate Route Frequency Ordered Stop  ° 04/09/19 1600  nitrofurantoin (macrocrystal-monohydrate) (MACROBID) capsule 100 mg    ° 100 mg Oral Every 12 hours 04/09/19 1525 04/14/19 2159  ° 04/09/19 1400  levofloxacin (LEVAQUIN) tablet 250 mg  Status:  Discontinued    ° 250 mg Oral Daily 04/09/19 1328 04/09/19 1525  ° 04/07/19 2100  doxycycline (VIBRAMYCIN) 100 mg in sodium chloride 0.9 % 250 mL IVPB  Status:  Discontinued    ° 100 mg °125 mL/hr over 120 Minutes Intravenous Every 12 hours 04/07/19 2049 04/09/19 1047  ° 04/07/19 1915  cefTRIAXone (ROCEPHIN) 1 g in sodium chloride 0.9 % 100 mL IVPB    ° 1 g °200 mL/hr over 30 Minutes Intravenous  Once 04/07/19 1905 04/07/19 2041  ° 04/07/19 1915  azithromycin (ZITHROMAX) 500 mg in sodium chloride 0.9 % 250 mL IVPB  Status:  Discontinued    ° 500 mg °250 mL/hr over 60 Minutes Intravenous  Once 04/07/19 1905 04/07/19 2040  ° 04/07/19 1900  cefTRIAXone (ROCEPHIN) 1 g in sodium chloride 0.9 % 100 mL IVPB  Status:  Discontinued    ° 1 g °200 mL/hr over 30 Minutes Intravenous Every 24 hours 04/07/19 2049 04/09/19 1047  °  ° ° °Subjective: °Reports feeling better. Diarrhea seems more formed ° °Objective: °Vitals:  ° 04/09/19 1504 04/09/19 1515 04/09/19 1517 04/09/19 1517  °BP: 117/88 (!) 159/117 (!) 136/96 (!) 136/97  °Pulse: (!) 137 (!) 123 (!) 106 (!) 114  °Resp:   (!) 24   °Temp:      °TempSrc:      °SpO2: 97% 97% 97% 97%  °Weight:      °Height:      ° ° °Intake/Output  Summary (Last 24 hours) at 04/09/2019 1535 °Last data filed at 04/09/2019 1100 °Gross per 24 hour  °Intake 1458.85 ml  °Output 3100 ml  °Net -1641.15 ml  ° °Filed Weights  ° 04/08/19 0015 04/08/19 0322 04/09/19 0550  °Weight: 72 kg 71.6 kg 71.1 kg  ° ° °Examination: °General exam: Awake, laying in bed, in nad °Respiratory system: Normal respiratory effort, no wheezing °Cardiovascular system: regular rate, s1, s2 °Gastrointestinal system: Soft, nondistended, positive BS °Central nervous system: CN2-12 grossly intact, strength intact °Extremities: Perfused, no clubbing °Skin: Normal skin turgor, no notable skin lesions seen °Psychiatry: Mood normal // no visual hallucinations  ° °Data Reviewed: I have personally reviewed following labs and imaging studies ° °CBC: °Recent Labs  °Lab 04/07/19 °1502 04/08/19 °0049 04/09/19 °0349  °WBC 14.0* 9.7 6.5  °HGB 15.8* 12.9 12.5  °HCT 45.6 37.8 37.0  °MCV 91.9 92.0 93.2  °PLT 240 182 138*  ° °Basic Metabolic Panel: °Recent Labs  °Lab 04/07/19 °1502 04/07/19 °2214 04/08/19 °0049 04/09/19 °0349 04/09/19 °1150  °NA 137  --  136 138  --   °  K 4.0  --  4.3 3.9  --   °CL 102  --  103 107  --   °CO2 18*  --  20* 22  --   °GLUCOSE 129*  --  101* 100*  --   °BUN 15  --  10 <5*  --   °CREATININE 0.92  --  0.97 0.68  --   °CALCIUM 8.7*  --  7.5* 8.1*  --   °MG  --  1.6* 1.2*  --  1.6*  °PHOS  --   --  2.5  --   --   ° °GFR: °Estimated Creatinine Clearance: 78.7 mL/min (by C-G formula based on SCr of 0.68 mg/dL). °Liver Function Tests: °Recent Labs  °Lab 04/07/19 °1502 04/08/19 °0049 04/09/19 °0349  °AST 69* 58* 34  °ALT 32 27 19  °ALKPHOS 159* 132* 125  °BILITOT 1.2 1.2 1.1  °PROT 7.6 6.5 6.0*  °ALBUMIN 4.3 3.5 3.2*  ° °Recent Labs  °Lab 04/07/19 °1502  °LIPASE 25  ° °No results for input(s): AMMONIA in the last 168 hours. °Coagulation Profile: °No results for input(s): INR, PROTIME in the last 168 hours. °Cardiac Enzymes: °No results for input(s): CKTOTAL, CKMB, CKMBINDEX, TROPONINI in the  last 168 hours. °BNP (last 3 results) °No results for input(s): PROBNP in the last 8760 hours. °HbA1C: °No results for input(s): HGBA1C in the last 72 hours. °CBG: °No results for input(s): GLUCAP in the last 168 hours. °Lipid Profile: °No results for input(s): CHOL, HDL, LDLCALC, TRIG, CHOLHDL, LDLDIRECT in the last 72 hours. °Thyroid Function Tests: °No results for input(s): TSH, T4TOTAL, FREET4, T3FREE, THYROIDAB in the last 72 hours. °Anemia Panel: °Recent Labs  °  04/08/19 °0049  °FERRITIN 673*  ° °Sepsis Labs: °Recent Labs  °Lab 04/07/19 °1749 04/07/19 °2214  °PROCALCITON  --  <0.10  °LATICACIDVEN 1.7  --   ° ° °Recent Results (from the past 240 hour(s))  °Urine Culture     Status: Abnormal  ° Collection Time: 04/07/19  5:43 PM  ° Specimen: Urine, Random  °Result Value Ref Range Status  ° Specimen Description URINE, RANDOM  Final  ° Special Requests   Final  °  ADDED 0057 04/08/2019 °Performed at [facility], [address] [place], [place] [address] °  ° Culture (A)  Final  °  >=100,000 COLONIES/mL ESCHERICHIA COLI °Confirmed Extended Spectrum Beta-Lactamase Producer (ESBL).  In bloodstream infections from ESBL organisms, carbapenems are preferred over piperacillin/tazobactam. They are shown to have a lower risk of mortality. °  ° Report Status 04/09/2019 FINAL  Final  ° Organism ID, Bacteria ESCHERICHIA COLI (A)  Final  °    Susceptibility  ° Escherichia coli - MIC*  °  AMPICILLIN >=32 RESISTANT Resistant   °  CEFAZOLIN >=64 RESISTANT Resistant   °  CEFTRIAXONE >=64 RESISTANT Resistant   °  CIPROFLOXACIN 0.5 SENSITIVE Sensitive   °  GENTAMICIN >=16 RESISTANT Resistant   °  IMIPENEM <=0.25 SENSITIVE Sensitive   °  NITROFURANTOIN <=16 SENSITIVE Sensitive   °  TRIMETH/SULFA >=320 RESISTANT Resistant   °  AMPICILLIN/SULBACTAM >=32 RESISTANT Resistant   °  PIP/TAZO <=4 SENSITIVE Sensitive   °  Extended ESBL POSITIVE Resistant   °  * >=100,000 COLONIES/mL ESCHERICHIA COLI  °SARS CORONAVIRUS 2 (TAT 6-24  HRS) Nasopharyngeal Nasopharyngeal Swab     Status: None  ° Collection Time: 04/07/19  5:44 PM  ° Specimen: Nasopharyngeal Swab  °Result Value Ref Range Status  ° SARS   Coronavirus 2 NEGATIVE NEGATIVE Final    Comment: (NOTE) SARS-CoV-2 target nucleic acids are NOT DETECTED. The SARS-CoV-2 RNA is generally detectable in upper and lower respiratory specimens during the acute phase of infection. Negative results do not preclude SARS-CoV-2 infection, do not rule out co-infections with other pathogens, and should not be used as the sole basis for treatment or other patient management decisions. Negative results must be combined with clinical observations, patient history, and epidemiological information. The expected result is Negative. Fact Sheet for Patients: SugarRoll.be Fact Sheet for Healthcare Providers: https://www.woods-mathews.com/ This test is not yet approved or cleared by the [place] FDA and  has been authorized for detection and/or diagnosis of SARS-CoV-2 by FDA under an Emergency Use Authorization (EUA). This EUA will remain  in effect (meaning this test can be used) for the duration of the COVID-19 declaration under Section 56 4(b)(1) of the Act, 21 U.S.C. section 360bbb-3(b)(1), unless the authorization is terminated or revoked sooner. Performed at [facility], [place] [address], [place], [place] [address]   Gastrointestinal Panel by PCR , Stool     Status: None   Collection Time: 04/07/19 11:38 PM   Specimen: Stool  Result Value Ref Range Status   Campylobacter species NOT DETECTED NOT DETECTED Final   Plesimonas shigelloides NOT DETECTED NOT DETECTED Final   Salmonella species NOT DETECTED NOT DETECTED Final   Yersinia enterocolitica NOT DETECTED NOT DETECTED Final   Vibrio species NOT DETECTED NOT DETECTED Final   Vibrio cholerae NOT DETECTED NOT DETECTED Final   Enteroaggregative E coli (EAEC) NOT DETECTED NOT  DETECTED Final   Enteropathogenic E coli (EPEC) NOT DETECTED NOT DETECTED Final   Enterotoxigenic E coli (ETEC) NOT DETECTED NOT DETECTED Final   Shiga like toxin producing E coli (STEC) NOT DETECTED NOT DETECTED Final   Shigella/Enteroinvasive E coli (EIEC) NOT DETECTED NOT DETECTED Final   Cryptosporidium NOT DETECTED NOT DETECTED Final   Cyclospora cayetanensis NOT DETECTED NOT DETECTED Final   Entamoeba histolytica NOT DETECTED NOT DETECTED Final   Giardia lamblia NOT DETECTED NOT DETECTED Final   Adenovirus F40/41 NOT DETECTED NOT DETECTED Final   Astrovirus NOT DETECTED NOT DETECTED Final   Norovirus GI/GII NOT DETECTED NOT DETECTED Final   Rotavirus A NOT DETECTED NOT DETECTED Final   Sapovirus (I, II, IV, and V) NOT DETECTED NOT DETECTED Final    Comment: Performed at [facility], [address] [place], [place] [address]  C difficile quick scan w PCR reflex     Status: None   Collection Time: 04/07/19 11:38 PM   Specimen: STOOL  Result Value Ref Range Status   C Diff antigen NEGATIVE NEGATIVE Final   C Diff toxin NEGATIVE NEGATIVE Final   C Diff interpretation No C. difficile detected.  Final    Comment: Performed at [facility], [place] [address], [place], [place] [address]  Respiratory Panel by PCR     Status: None   Collection Time: 04/08/19  1:12 PM   Specimen: Nasopharyngeal Swab; Respiratory  Result Value Ref Range Status   Adenovirus NOT DETECTED NOT DETECTED Final   Coronavirus 229E NOT DETECTED NOT DETECTED Final    Comment: (NOTE) The Coronavirus on the Respiratory Panel, DOES NOT test for the novel  Coronavirus (2019 nCoV)    Coronavirus HKU1 NOT DETECTED NOT DETECTED Final   Coronavirus NL63 NOT DETECTED NOT DETECTED Final   Coronavirus OC43 NOT DETECTED NOT DETECTED Final   Metapneumovirus NOT DETECTED NOT  DETECTED Final  ° Rhinovirus / Enterovirus NOT DETECTED NOT DETECTED Final  ° Influenza A NOT DETECTED NOT DETECTED Final  ° Influenza B  NOT DETECTED NOT DETECTED Final  ° Parainfluenza Virus 1 NOT DETECTED NOT DETECTED Final  ° Parainfluenza Virus 2 NOT DETECTED NOT DETECTED Final  ° Parainfluenza Virus 3 NOT DETECTED NOT DETECTED Final  ° Parainfluenza Virus 4 NOT DETECTED NOT DETECTED Final  ° Respiratory Syncytial Virus NOT DETECTED NOT DETECTED Final  ° Bordetella pertussis NOT DETECTED NOT DETECTED Final  ° Chlamydophila pneumoniae NOT DETECTED NOT DETECTED Final  ° Mycoplasma pneumoniae NOT DETECTED NOT DETECTED Final  °  Comment: Performed at [facility], [address] [place], [place] [address]  °  ° °Radiology Studies: °Ct Angio Chest Pe W Or Wo Contrast ° °Result Date: 04/08/2019 °CLINICAL DATA:  Shortness of breath and possible pneumonia EXAM: CT ANGIOGRAPHY CHEST WITH CONTRAST TECHNIQUE: Multidetector CT imaging of the chest was performed using the standard protocol during bolus administration of intravenous contrast. Multiplanar CT image reconstructions and MIPs were obtained to evaluate the vascular anatomy. CONTRAST:  80mL OMNIPAQUE IOHEXOL 350 MG/ML SOLN COMPARISON:  None. FINDINGS: Cardiovascular: --Pulmonary arteries: Contrast injection is sufficient to demonstrate satisfactory opacification of the pulmonary arteries to the segmental level, with attenuation of at least 200 HU at the main pulmonary artery. There is no pulmonary embolus. The main pulmonary artery is within normal limits for size. --Aorta: Satisfactory opacification of the thoracic aorta. No aortic dissection or other acute aortic syndrome. Conventional 3 vessel aortic branching pattern. There is no aortic atherosclerosis. --Heart: Normal size. No pericardial effusion. Mediastinum/Nodes: No mediastinal, hilar or axillary lymphadenopathy. The visualized thyroid and thoracic esophageal course are unremarkable. Lungs/Pleura: No pulmonary nodules or masses. No pleural effusion or pneumothorax. There is right basilar atelectasis. No focal airspace consolidation.  No focal pleural abnormality. Upper Abdomen: Contrast bolus timing is not optimized for evaluation of the abdominal organs. The visualized portions of the organs of the upper abdomen are normal. Musculoskeletal: No chest wall abnormality. No bony spinal canal stenosis. Review of the MIP images confirms the above findings. IMPRESSION: 1. No pulmonary embolus or acute aortic syndrome. 2. Right basilar atelectasis but otherwise clear lungs. Electronically Signed   By: Kevin  Herman M.D.   On: 04/08/2019 02:40  ° °Dg Chest Portable 1 View ° °Result Date: 04/07/2019 °CLINICAL DATA:  Cough. EXAM: PORTABLE CHEST 1 VIEW COMPARISON:  Chest x-ray dated July 01, 2018. FINDINGS: The heart size and mediastinal contours are within normal limits. Normal pulmonary vascularity. The lungs remain hyperinflated. New mild opacity at the left lung base. No pleural effusion or pneumothorax. No acute osseous abnormality. IMPRESSION: 1. Mild left lower lobe pneumonia. 2. COPD. Electronically Signed   By: William T Derry M.D.   On: 04/07/2019 18:17  ° ° °Scheduled Meds: °• enoxaparin (LOVENOX) injection  40 mg Subcutaneous Q24H  °• folic acid  1 mg Oral Daily  °• multivitamin with minerals  1 tablet Oral Daily  °• nicotine  14 mg Transdermal Daily  °• nitrofurantoin (macrocrystal-monohydrate)  100 mg Oral Q12H  °• thiamine  100 mg Oral Daily  ° Or  °• thiamine  100 mg Intravenous Daily  ° °Continuous Infusions: °• magnesium sulfate bolus IVPB 4 g (04/09/19 1437)  ° ° ° LOS: 2 days  ° °Stephen Chiu, MD °[facility] °Pager On Amion ° °If 7PM-7AM, please contact night-coverage °04/09/2019, 3:35 PM  ° ° °

## 2019-04-09 NOTE — Plan of Care (Signed)

## 2019-04-09 NOTE — Progress Notes (Addendum)
Pharmacy Antibiotic Note  Vicki [name] is a 53 y.o. female admitted on 04/07/2019 with UTI.  Pharmacy has been consulted for Levaquin dosing. Patient had prolonged QTc two days ago of 561 msec. Most recent QTc yesterday is 446 msec. Mg is also 1.6 after supplementing with IV Magnesium yesterday. Will need to follow closely with start of Levaquin. Patient is afebrile with wbc wnl. PCT < 0.1, Lactate 1.7. Renal function stable.  Plan: Levaquin 250 mg PO daily for 3 days Monitor renal function and QTc and clinical progression  ============================================= Addendum: Patient had an allergic reaction to Levaquin after first dose, requiring IV benadryl and solumedrol.  - Stop Levaquin - Start Nitrofurantoin 100mg  Q12 hrs PO for 5 days  Height: 5\' 3"  (160 cm) Weight: 156 lb 11.2 oz (71.1 kg) IBW/kg (Calculated) : 52.4  Temp (24hrs), Avg:98.7 F (37.1 C), Min:98.3 F (36.8 C), Max:98.9 F (37.2 C)  Recent Labs  Lab 04/07/19 1502 04/07/19 1749 04/08/19 0049 04/09/19 0349  WBC 14.0*  --  9.7 6.5  CREATININE 0.92  --  0.97 0.68  LATICACIDVEN  --  1.7  --   --     Estimated Creatinine Clearance: 78.7 mL/min (by C-G formula based on SCr of 0.68 mg/dL).    Allergies  Allergen Reactions  . Jasmine Oil Shortness Of Breath and Swelling  . Penicillins Anaphylaxis and Hives    Tolerated ceftriaxone  . Aleve [Naproxen Sodium] Itching  . Benadryl [Diphenhydramine] Other (See Comments)    hallucinations  . Ceftin [Cefuroxime Axetil] Other (See Comments)    unsure  . Depo-Provera [Medroxyprogesterone Acetate] Other (See Comments)    Severe acne  . Doxycycline     Raises blood pressure   . Oxycodone Nausea And Vomiting  . Tramadol Other (See Comments)    Kept her up all night    Antimicrobials this admission: Nitrofurantoin 11/26 >> (12/1) Levofloxacin 11/26 x1 Doxycycline 11/24 >> 11/26 Ceftriaxone 11/24 >> 11/26  Microbiology results: 11/24 Ucx: ESBL UTI  (S: cipro, nitrofurantoin; R: amp, cefazolin, ctx, gent, bactrim, unasyn, zosyn)  Richardine Service, PharmD PGY1 Pharmacy Resident Phone: [phone] 04/09/2019  2:40 PM  Please check [email or web address] for unit-specific pharmacy phone numbers.

## 2019-04-09 NOTE — Progress Notes (Addendum)
Patient is  SOB  HR 140s with Redness and Burning tougue down to her legs after receiving PO antibiotics Levaquin  antibiotics  Paged MD and received orders for benadryl and Iv solumedrol. Iv Magnesium infusing 2nd Iv was placed.  Respiratory gave Duo neb x1.  Patient states she feels better withy a little drowsiness. Husband at the bedside.

## 2019-04-10 DIAGNOSIS — R6510 Systemic inflammatory response syndrome (SIRS) of non-infectious origin without acute organ dysfunction: Secondary | ICD-10-CM

## 2019-04-10 DIAGNOSIS — N3000 Acute cystitis without hematuria: Secondary | ICD-10-CM

## 2019-04-10 DIAGNOSIS — A084 Viral intestinal infection, unspecified: Secondary | ICD-10-CM

## 2019-04-10 LAB — COMPREHENSIVE METABOLIC PANEL
ALT: 17 U/L (ref 0–44)
AST: 28 U/L (ref 15–41)
Albumin: 3.3 g/dL — ABNORMAL LOW (ref 3.5–5.0)
Alkaline Phosphatase: 115 U/L (ref 38–126)
Anion gap: 14 (ref 5–15)
BUN: <5 mg/dL — ABNORMAL LOW (ref 6–20)
CO2: 23 mmol/L (ref 22–32)
Calcium: 8.8 mg/dL — ABNORMAL LOW (ref 8.9–10.3)
Chloride: 103 mmol/L (ref 98–111)
Creatinine, Ser: 0.53 mg/dL (ref 0.44–1.00)
GFR calc Af Amer: >60 mL/min (ref >60)
GFR calc non Af Amer: >60 mL/min (ref >60)
Glucose, Bld: 133 mg/dL — ABNORMAL HIGH (ref 70–99)
Potassium: 3.4 mmol/L — ABNORMAL LOW (ref 3.5–5.1)
Sodium: 140 mmol/L (ref 135–145)
Total Bilirubin: 0.8 mg/dL (ref 0.3–1.2)
Total Protein: 6.5 g/dL (ref 6.5–8.1)

## 2019-04-10 LAB — CBC
HCT: 37.8 % (ref 36.0–46.0)
Hemoglobin: 13.1 g/dL (ref 12.0–15.0)
MCH: 31.3 pg (ref 26.0–34.0)
MCHC: 34.7 g/dL (ref 30.0–36.0)
MCV: 90.4 fL (ref 80.0–100.0)
Platelets: 152 10*3/uL (ref 150–400)
RBC: 4.18 MIL/uL (ref 3.87–5.11)
RDW: 12.1 % (ref 11.5–15.5)
WBC: 5.6 10*3/uL (ref 4.0–10.5)
nRBC: 0 % (ref 0.0–0.2)

## 2019-04-10 LAB — MAGNESIUM: Magnesium: 2.2 mg/dL (ref 1.7–2.4)

## 2019-04-10 MED ORDER — POTASSIUM CHLORIDE CRYS ER 20 MEQ PO TBCR
20.0000 meq | EXTENDED_RELEASE_TABLET | Freq: Once | ORAL | Status: AC
  Administered 2019-04-10: 20 meq via ORAL
  Filled 2019-04-10: qty 1

## 2019-04-10 MED ORDER — DOXYCYCLINE HYCLATE 100 MG PO TABS
100.0000 mg | ORAL_TABLET | Freq: Two times a day (BID) | ORAL | Status: DC
  Administered 2019-04-10 – 2019-04-11 (×3): 100 mg via ORAL
  Filled 2019-04-10 (×3): qty 1

## 2019-04-10 MED ORDER — SODIUM CHLORIDE 0.9 % IV SOLN
1.0000 g | Freq: Three times a day (TID) | INTRAVENOUS | Status: DC
  Administered 2019-04-10 – 2019-04-11 (×2): 1 g via INTRAVENOUS
  Filled 2019-04-10 (×4): qty 1

## 2019-04-10 MED ORDER — ALPRAZOLAM 0.25 MG PO TABS
0.2500 mg | ORAL_TABLET | Freq: Three times a day (TID) | ORAL | Status: DC | PRN
  Administered 2019-04-10: 0.25 mg via ORAL
  Filled 2019-04-10: qty 1

## 2019-04-10 NOTE — Progress Notes (Signed)
PROGRESS NOTE  Vicki [name] FXT:[id number] DOB: 19-Nov-1965 DOA: 04/07/2019 PCP: Rutherford Guys, MD  Brief History   53 y.o.femalewith medical history significant ofasthma, COPD, diverticulitis, alcohol use, tobacco use, anxiety, depression presenting with complaints of diarrhea. Patient states for the past 3 days she has been having yellow-colored watery diarrhea. Denies recent antibiotic use. Since yesterday she has been vomiting constantly and has not been able to tolerate any p.o. intake. She is feeling weak and lightheaded. She is also coughing a lot and having shortness of breath. She is having a burning sensation in her chest and upper abdomen. She gets a popping sensation in her ears and is supposed to see an ENT doctor. No dysuria but her urine has been cloudy. She is not having any fevers or chills. States her husband had a cold 2 weeks ago from which she has now recovered. He was not tested for COVID-19.  ED Course:Tachycardic. Afebrile. WBC count 14.0. Lactic acid normal. Blood glucose 129. Bicarb 18, anion gap 17. AST and alk phos mildly elevated. ALT and T bili normal. Lipase normal. UA with ketones, protein, moderate amount of leukocytes, 11-20 RBCs, 11-20 WBCs, and many bacteria. SARS-CoV-2 test pending. Blood ethanol level undetectable. Chest x-ray showing mild opacity at the left lung base. Patient became lightheaded and tachycardic with ambulation but maintaining oxygen saturation around 98%. Patient received Ativan, morphine, Zofran, ceftriaxone, azithromycin, and 2 L normal saline boluses.  The patient has been admitted to a telemetry bed. She is receiving IV antibiotics to treat ESBL UTI. She has been tested for COVID 19 and viral panel both of which were negative. Her diarrhea has slowed down, and she is feeling a little better. Urine culture has grown out ESBL E. Coli.   Consultants  . None  Procedures  . None  Antibiotics    Anti-infectives (From admission, onward)   Start     Dose/Rate Route Frequency Ordered Stop   04/10/19 1645  meropenem (MERREM) 1 g in sodium chloride 0.9 % 100 mL IVPB     1 g 200 mL/hr over 30 Minutes Intravenous Every 8 hours 04/10/19 1634     04/10/19 1100  doxycycline (VIBRA-TABS) tablet 100 mg     100 mg Oral Every 12 hours 04/10/19 1051     04/09/19 1600  nitrofurantoin (macrocrystal-monohydrate) (MACROBID) capsule 100 mg  Status:  Discontinued     100 mg Oral Every 12 hours 04/09/19 1525 04/10/19 1634   04/09/19 1600  doxycycline (VIBRAMYCIN) 100 mg in sodium chloride 0.9 % 250 mL IVPB  Status:  Discontinued     100 mg 125 mL/hr over 120 Minutes Intravenous Every 12 hours 04/09/19 1554 04/10/19 1051   04/09/19 1400  levofloxacin (LEVAQUIN) tablet 250 mg  Status:  Discontinued     250 mg Oral Daily 04/09/19 1328 04/09/19 1525   04/07/19 2100  doxycycline (VIBRAMYCIN) 100 mg in sodium chloride 0.9 % 250 mL IVPB  Status:  Discontinued     100 mg 125 mL/hr over 120 Minutes Intravenous Every 12 hours 04/07/19 2049 04/09/19 1047   04/07/19 1915  cefTRIAXone (ROCEPHIN) 1 g in sodium chloride 0.9 % 100 mL IVPB     1 g 200 mL/hr over 30 Minutes Intravenous  Once 04/07/19 1905 04/07/19 2041   04/07/19 1915  azithromycin (ZITHROMAX) 500 mg in sodium chloride 0.9 % 250 mL IVPB  Status:  Discontinued     500 mg 250 mL/hr over 60 Minutes Intravenous  Once 04/07/19 1905 04/07/19  2040   04/07/19 1900  cefTRIAXone (ROCEPHIN) 1 g in sodium chloride 0.9 % 100 mL IVPB  Status:  Discontinued     1 g 200 mL/hr over 30 Minutes Intravenous Every 24 hours 04/07/19 2049 04/09/19 1047    .   Subjective  The patient is resting comfortably. No new complaints.  Objective   Vitals:  Vitals:   04/10/19 1159 04/10/19 1256  BP: (!) 141/103 (!) 142/100  Pulse:  72  Resp: 18   Temp:    SpO2:     Exam:  Constitutional:  . The patient is awake, alert, and oriented x 3. She is complaining of anxiety.  Respiratory:  . No increased work of breathing. . No wheezes, rales, or rhonchi . No tactile fremitus Cardiovascular:  . Regular rate and rhythm . No murmurs, ectopy, or gallups. . No lateral PMI. No thrills. Abdomen:  . Abdomen is soft, non-tender, non-distended . No hernias, masses, or organomegaly . Normoactive bowel sounds.  Musculoskeletal:  . No cyanosis, clubbing, or edema Skin:  . No rashes, lesions, ulcers . palpation of skin: no induration or nodules Neurologic:  . CN 2-12 intact . Sensation all 4 extremities intact Psychiatric:  . Mental status o Mood, affect appropriate o Orientation to person, place, time  . judgment and insight appear intact  I have personally reviewed the following:   Today's Data  . Vitals, CBC, CMP  Micro Data  . ESBL E. Coli from urine.  Scheduled Meds: . doxycycline  100 mg Oral Q12H  . enoxaparin (LOVENOX) injection  40 mg Subcutaneous Q24H  . folic acid  1 mg Oral Daily  . multivitamin with minerals  1 tablet Oral Daily  . nicotine  14 mg Transdermal Daily  . nitrofurantoin (macrocrystal-monohydrate)  100 mg Oral Q12H  . potassium chloride  20 mEq Oral Once  . thiamine  100 mg Oral Daily   Or  . thiamine  100 mg Intravenous Daily   Continuous Infusions:  Principal Problem:   CAP (community acquired pneumonia) Active Problems:   UTI (urinary tract infection)   SIRS (systemic inflammatory response syndrome) ([place])   Suspected COVID-19 virus infection   Viral gastroenteritis   LOS: 3 days   A & P  Suspected viral illness: Leukocytosis with Initial chest x-ray suggestive of mild opacity at the left lung base concerning for pneumonia. Patient is afebrile and lactic acid is normal. Procalcitonin negative. Viral panel and COVID-19 is negative.   ESBL ecoli UTI with sepsis: Sepsis present on admission. Now resolved. Pt initially received Rocephin, but has been changed over to meropenem after the patient had a reaction to  levaquin.   Viral gastroenteritis: Diarrhea is resolving and she is tolerating a diet. Patient presented with complaints of emesis and diarrhea.Lipase normal. GI pathogen panel is negative. IV fluid hydration and antiemetics.   High anion gap metabolic acidosis: Resolved. Secondary to diarrhea. Monitor.  Possible mild acute alcohol withdrawal: Patient reported to ED provider that she drinks alcohol daily. Blood ethanol level undetectable on presentation. Thiamine, folate, multivitamin supplementation. Continue CIWA protocol; Ativan as needed.  Tobacco abuse: The patient has received 7-10 minues of tobacco cessation counseling. NicoDerm patch is available.  QT prolongation on EKG QTC 561 on presentation: Cardiac monitoring. Avoid medications that prolong QT.   Allergic reaction: Allergy list was reviewed prior to giving trial of levaquin. Per RN, pt rapidly developed sob, wheezing, itching shortly after receiving levaquin. Given IV benadryl and IV solumedrol. Will add levaquin  to allergy list.  I have seen and examined this patient myself. I have spent 32 minutes in her evaluation and care.  DVT prophylaxis: Lovenox subQ Code Status: Full Family Communication: No familyis available. Disposition Plan: tbd  Camdin Hegner, DO [facility] Direct contact: see [email or web address]  7PM-7AM contact night coverage as above 04/10/2019, 4:32 PM  LOS: 3 days

## 2019-04-11 LAB — COMPREHENSIVE METABOLIC PANEL
ALT: 25 U/L (ref 0–44)
AST: 44 U/L — ABNORMAL HIGH (ref 15–41)
Albumin: 3.1 g/dL — ABNORMAL LOW (ref 3.5–5.0)
Alkaline Phosphatase: 103 U/L (ref 38–126)
Anion gap: 11 (ref 5–15)
BUN: 9 mg/dL (ref 6–20)
CO2: 25 mmol/L (ref 22–32)
Calcium: 9 mg/dL (ref 8.9–10.3)
Chloride: 105 mmol/L (ref 98–111)
Creatinine, Ser: 0.59 mg/dL (ref 0.44–1.00)
GFR calc Af Amer: >60 mL/min (ref >60)
GFR calc non Af Amer: >60 mL/min (ref >60)
Glucose, Bld: 103 mg/dL — ABNORMAL HIGH (ref 70–99)
Potassium: 3.2 mmol/L — ABNORMAL LOW (ref 3.5–5.1)
Sodium: 141 mmol/L (ref 135–145)
Total Bilirubin: 0.8 mg/dL (ref 0.3–1.2)
Total Protein: 5.9 g/dL — ABNORMAL LOW (ref 6.5–8.1)

## 2019-04-11 MED ORDER — ADULT MULTIVITAMIN W/MINERALS CH: 1.0000 | ORAL_TABLET | Freq: Every day | ORAL | 0 refills | Status: DC

## 2019-04-11 MED ORDER — DOXYCYCLINE HYCLATE 100 MG PO TABS: 100.0000 mg | ORAL_TABLET | Freq: Two times a day (BID) | ORAL | 0 refills | Status: AC

## 2019-04-11 MED ORDER — THIAMINE HCL 100 MG PO TABS: 100.0000 mg | ORAL_TABLET | Freq: Every day | ORAL | 0 refills | Status: DC

## 2019-04-11 MED ORDER — SODIUM CHLORIDE 0.9 % IV SOLN: INTRAVENOUS | Status: DC | PRN

## 2019-04-11 MED ORDER — FOSFOMYCIN TROMETHAMINE 3 G PO PACK
3.0000 g | PACK | Freq: Once | ORAL | Status: AC
  Administered 2019-04-11: 3 g via ORAL
  Filled 2019-04-11 (×2): qty 3

## 2019-04-11 MED ORDER — ALPRAZOLAM 0.25 MG PO TABS: 0.2500 mg | ORAL_TABLET | Freq: Three times a day (TID) | ORAL | 0 refills | Status: DC | PRN

## 2019-04-11 MED ORDER — POTASSIUM CHLORIDE CRYS ER 20 MEQ PO TBCR
40.0000 meq | EXTENDED_RELEASE_TABLET | Freq: Once | ORAL | Status: AC
  Administered 2019-04-11: 40 meq via ORAL
  Filled 2019-04-11: qty 2

## 2019-04-11 MED ORDER — FOLIC ACID 1 MG PO TABS: 1.0000 mg | ORAL_TABLET | Freq: Every day | ORAL | 0 refills | Status: DC

## 2019-04-11 NOTE — Progress Notes (Signed)
Pt's last dose of Merrem was verbally d/c by Dr Benny Lennert due to loss of IV access and pts recent hx of bilateral infiltrations/early phlebitis.    Pt with two IVs at left forearm, at scheduled time for Merrem. Upon assessing the IV sites, the more distal of the two was leaking with flushing, the more proximal/anterior IV was red/inflamed with some streaking (3.5-4") noted from that site as well.

## 2019-04-11 NOTE — Progress Notes (Signed)
SATURATION QUALIFICATIONS: (This note is used to comply with regulatory documentation for home oxygen)  Patient Saturations on Room Air at Rest = 99%  Patient Saturations on Room Air while Ambulating = 97%  Patient Saturations on 0 Liters of oxygen while Ambulating = N/A  Please briefly explain why patient needs home oxygen: Pt does not require home oxygen.

## 2019-04-11 NOTE — Discharge Summary (Addendum)
Physician Discharge Summary  Vicki [name] ZOX:[id number] DOB: [birth year] DOA: 04/07/2019  PCP: Vicki Guys, MD  Admit date: 04/07/2019 Discharge date: 04/11/2019  Recommendations for Outpatient Follow-up:  Follow up with PCP in 7-10 days. CBC to be drawn on 04/13/2019 and reported to PCP.   Discharge Diagnoses: Principal diagnosis is #1 Viral illness - clinically undetermined Gastroenteritis - clinically undetermined HAGMA Qt prolongation Alcohol withdrawal ESBL E. Coli UTI  Discharge Condition: Fair  Disposition: Home  Diet recommendation: Heart healthy  Filed Weights   04/09/19 0550 04/10/19 0518 04/11/19 0502  Weight: 71.1 kg 70.8 kg 71.3 kg    History of present illness:   Vicki [name] is a 53 y.o. female with medical history significant of asthma, COPD, diverticulitis, alcohol use, tobacco use, anxiety, depression presenting with complaints of diarrhea.  Patient states for the past 3 days she has been having yellow-colored watery diarrhea.  Denies recent antibiotic use.  Since yesterday she has been vomiting constantly and has not been able to tolerate any p.o. intake.  She is feeling weak and lightheaded.  She is also coughing a lot and having shortness of breath.  She is having a burning sensation in her chest and upper abdomen.  She gets a popping sensation in her ears and is supposed to see an ENT doctor.  No dysuria but her urine has been cloudy.  She is not having any fevers or chills.  States her husband had a cold 2 weeks ago from which she has now recovered.  He was not tested for COVID-19.   ED Course: Tachycardic.  Afebrile.  WBC count 14.0.  Lactic acid normal.  Blood glucose 129.  Bicarb 18, anion gap 17.  AST and alk phos mildly elevated.  ALT and T bili normal.  Lipase normal.  UA with ketones, protein, moderate amount of leukocytes, 11-20 RBCs, 11-20 WBCs, and many bacteria.  SARS-CoV-2 test pending.  Blood ethanol level undetectable.  Chest  x-ray showing mild opacity at the left lung base.  Patient became lightheaded and tachycardic with ambulation but maintaining oxygen saturation around 98%. Patient received Ativan, morphine, Zofran, ceftriaxone, azithromycin, and 2 L normal saline boluses.  Hospital Course:  The patient has been admitted to a telemetry bed. She is receiving IV antibiotics to treat ESBL UTI. She has been tested for COVID 19 and viral panel both of which were negative. Her diarrhea has slowed down, and she is feeling a little better. Urine culture has grown out ESBL E. Coli.   Today's assessment: S: The patient is feeling better. No new complaints. O: Vitals:  Vitals:   04/11/19 0502 04/11/19 0916  BP: (!) 122/95 (!) 135/95  Pulse: 99 73  Resp: 18 20  Temp: 97.9 F (36.6 C) 97.9 F (36.6 C)  SpO2: 93%    Constitutional:  The patient is awake, alert, and oriented x 3. She is complaining of anxiety. Respiratory:  No increased work of breathing. No wheezes, rales, or rhonchi No tactile fremitus Cardiovascular:  Regular rate and rhythm No murmurs, ectopy, or gallups. No lateral PMI. No thrills. Abdomen:  Abdomen is soft, non-tender, non-distended No hernias, masses, or organomegaly Normoactive bowel sounds.  Musculoskeletal:  No cyanosis, clubbing, or edema Skin:  No rashes, lesions, ulcers palpation of skin: no induration or nodules Neurologic:  CN 2-12 intact Sensation all 4 extremities intact Psychiatric:  Mental status Mood, affect appropriate Orientation to person, place, time  judgment and insight appear intact  Discharge Instructions  Discharge  Instructions     Activity as tolerated - No restrictions   Complete by: As directed    Call MD for:  difficulty breathing, headache or visual disturbances   Complete by: As directed    Call MD for:  severe uncontrolled pain   Complete by: As directed    Call MD for:  temperature >100.4   Complete by: As directed    Diet - low sodium  heart healthy   Complete by: As directed    Discharge instructions   Complete by: As directed    CBC to be drawn on 04/13/2019. Report results to PCP. Follow up with PCP in 7-10 days.   Increase activity slowly   Complete by: As directed       Allergies as of 04/11/2019       Reactions   Jasmine Oil Shortness Of Breath, Swelling   Levaquin [levofloxacin] Shortness Of Breath   Rash, swelling, itching   Penicillins Anaphylaxis, Hives   Tolerated ceftriaxone   Aleve [naproxen Sodium] Itching   Benadryl [diphenhydramine] Other (See Comments)   hallucinations   Ceftin [cefuroxime Axetil] Other (See Comments)   unsure   Depo-provera [medroxyprogesterone Acetate] Other (See Comments)   Severe acne   Doxycycline    Raises blood pressure    Oxycodone Nausea And Vomiting   Tramadol Other (See Comments)   Kept her up all night        Medication List     STOP taking these medications    atorvastatin 20 MG tablet Commonly known as: LIPITOR   bismuth subsalicylate 253 MG chewable tablet Commonly known as: PEPTO BISMOL   gabapentin 300 MG capsule Commonly known as: NEURONTIN   guaiFENesin 100 MG/5ML liquid Commonly known as: ROBITUSSIN   ibuprofen 800 MG tablet Commonly known as: ADVIL       TAKE these medications    acetaminophen 325 MG tablet Commonly known as: TYLENOL Take 650 mg by mouth every 6 (six) hours as needed.   albuterol 108 (90 Base) MCG/ACT inhaler Commonly known as: VENTOLIN HFA Inhale 2 puffs into the lungs every 4 (four) hours as needed for wheezing or shortness of breath.   ALPRAZolam 0.25 MG tablet Commonly known as: XANAX Take 1 tablet (0.25 mg total) by mouth 3 (three) times daily as needed for anxiety. What changed:  medication strength how much to take how to take this when to take this reasons to take this additional instructions   cetirizine 10 MG tablet Commonly known as: ZYRTEC Take 1 tablet (10 mg total) by mouth daily.     citalopram 20 MG tablet Commonly known as: CELEXA Take 3 tablets (60 mg total) by mouth daily.   doxycycline 100 MG tablet Commonly known as: VIBRA-TABS Take 1 tablet (100 mg total) by mouth every 12 (twelve) hours for 6 days.   eletriptan 40 MG tablet Commonly known as: RELPAX TAKE 1 TAB BY MOUTH AT ONSET OF MIGRAINE OR HEADACHE AS NEEDED. MAY REPEAT IN 2 HOURS IF PERSISTS What changed:  how much to take how to take this when to take this additional instructions   Fluticasone-Salmeterol 500-50 MCG/DOSE Aepb Commonly known as: Advair Diskus INHALE 1 PUFF INTO THE LUNGS TWICE A DAY What changed:  how much to take how to take this when to take this additional instructions   folic acid 1 MG tablet Commonly known as: FOLVITE Take 1 tablet (1 mg total) by mouth daily. Start taking on: April 12, 2019   glycopyrrolate 1  MG tablet Commonly known as: ROBINUL TAKE 1 TABLET BY MOUTH EVERY EVENING   multivitamin with minerals Tabs tablet Take 1 tablet by mouth daily. Start taking on: April 12, 2019   ondansetron 8 MG tablet Commonly known as: ZOFRAN Take 1 tablet (8 mg total) by mouth every 8 (eight) hours as needed for nausea or vomiting.   rOPINIRole 1 MG tablet Commonly known as: REQUIP Take 1 tablet (1 mg total) by mouth at bedtime.   thiamine 100 MG tablet Take 1 tablet (100 mg total) by mouth daily. Start taking on: April 12, 2019   tiotropium 18 MCG inhalation capsule Commonly known as: Spiriva HandiHaler Place 1 capsule (18 mcg total) into inhaler and inhale daily.   traZODone 50 MG tablet Commonly known as: DESYREL TAKE 1 TABLET BY MOUTH EVERYDAY AT BEDTIME What changed:  See the new instructions. Another medication with the same name was removed. Continue taking this medication, and follow the directions you see here.   umeclidinium bromide 62.5 MCG/INH Aepb Commonly known as: INCRUSE ELLIPTA Inhale 1 puff into the lungs daily.        Allergies  Allergen Reactions   Jasmine Oil Shortness Of Breath and Swelling   Levaquin [Levofloxacin] Shortness Of Breath    Rash, swelling, itching   Penicillins Anaphylaxis and Hives    Tolerated ceftriaxone   Aleve [Naproxen Sodium] Itching   Benadryl [Diphenhydramine] Other (See Comments)    hallucinations   Ceftin [Cefuroxime Axetil] Other (See Comments)    unsure   Depo-Provera [Medroxyprogesterone Acetate] Other (See Comments)    Severe acne   Doxycycline     Raises blood pressure    Oxycodone Nausea And Vomiting   Tramadol Other (See Comments)    Kept her up all night    The results of significant diagnostics from this hospitalization (including imaging, microbiology, ancillary and laboratory) are listed below for reference.    Significant Diagnostic Studies: Ct Head Wo Contrast  Result Date: 03/16/2019 CLINICAL DATA:  TIA, left-sided numbness since last night EXAM: CT HEAD WITHOUT CONTRAST TECHNIQUE: Contiguous axial images were obtained from the base of the skull through the vertex without intravenous contrast. COMPARISON:  None. FINDINGS: Brain: No evidence of acute infarction, hemorrhage, hydrocephalus, extra-axial collection or mass lesion/mass effect. Vascular: No hyperdense vessel or unexpected calcification. Skull: No osseous abnormality. Sinuses/Orbits: Visualized paranasal sinuses are clear. Visualized mastoid sinuses are clear. Visualized orbits demonstrate no focal abnormality. Other: None IMPRESSION: No acute intracranial pathology. Electronically Signed   By: Kathreen Devoid   On: 03/16/2019 16:06   Ct Angio Chest Pe W Or Wo Contrast  Result Date: 04/08/2019 CLINICAL DATA:  Shortness of breath and possible pneumonia EXAM: CT ANGIOGRAPHY CHEST WITH CONTRAST TECHNIQUE: Multidetector CT imaging of the chest was performed using the standard protocol during bolus administration of intravenous contrast. Multiplanar CT image reconstructions and MIPs were obtained to  evaluate the vascular anatomy. CONTRAST:  35m OMNIPAQUE IOHEXOL 350 MG/ML SOLN COMPARISON:  None. FINDINGS: Cardiovascular: --Pulmonary arteries: Contrast injection is sufficient to demonstrate satisfactory opacification of the pulmonary arteries to the segmental level, with attenuation of at least 200 HU at the main pulmonary artery. There is no pulmonary embolus. The main pulmonary artery is within normal limits for size. --Aorta: Satisfactory opacification of the thoracic aorta. No aortic dissection or other acute aortic syndrome. Conventional 3 vessel aortic branching pattern. There is no aortic atherosclerosis. --Heart: Normal size. No pericardial effusion. Mediastinum/Nodes: No mediastinal, hilar or axillary lymphadenopathy. The visualized thyroid  and thoracic esophageal course are unremarkable. Lungs/Pleura: No pulmonary nodules or masses. No pleural effusion or pneumothorax. There is right basilar atelectasis. No focal airspace consolidation. No focal pleural abnormality. Upper Abdomen: Contrast bolus timing is not optimized for evaluation of the abdominal organs. The visualized portions of the organs of the upper abdomen are normal. Musculoskeletal: No chest wall abnormality. No bony spinal canal stenosis. Review of the MIP images confirms the above findings. IMPRESSION: 1. No pulmonary embolus or acute aortic syndrome. 2. Right basilar atelectasis but otherwise clear lungs. Electronically Signed   By: Ulyses Jarred M.D.   On: 04/08/2019 02:40   Dg Chest Portable 1 View  Result Date: 04/07/2019 CLINICAL DATA:  Cough. EXAM: PORTABLE CHEST 1 VIEW COMPARISON:  Chest x-ray dated July 01, 2018. FINDINGS: The heart size and mediastinal contours are within normal limits. Normal pulmonary vascularity. The lungs remain hyperinflated. New mild opacity at the left lung base. No pleural effusion or pneumothorax. No acute osseous abnormality. IMPRESSION: 1. Mild left lower lobe pneumonia. 2. COPD.  Electronically Signed   By: Titus Dubin M.D.   On: 04/07/2019 18:17   Ue Venous Duplex (mc & Wl 7 Am - 7 Pm)  Result Date: 03/17/2019 UPPER VENOUS STUDY  Indications: Swelling, and reaction to Shingles vaccine Comparison Study: No prior Performing Technologist: Sharion Dove RVS  Examination Guidelines: A complete evaluation includes B-mode imaging, spectral Doppler, color Doppler, and power Doppler as needed of all accessible portions of each vessel. Bilateral testing is considered an integral part of a complete examination. Limited examinations for reoccurring indications may be performed as noted.  Right Findings: +----------+------------+---------+-----------+----------+-------+  RIGHT      Compressible Phasicity Spontaneous Properties Summary  +----------+------------+---------+-----------+----------+-------+  Subclavian                 Yes        Yes                         +----------+------------+---------+-----------+----------+-------+  Left Findings: +----------+------------+---------+-----------+----------+-------+  LEFT       Compressible Phasicity Spontaneous Properties Summary  +----------+------------+---------+-----------+----------+-------+  IJV            Full        Yes        Yes                         +----------+------------+---------+-----------+----------+-------+  Subclavian                 Yes        Yes                         +----------+------------+---------+-----------+----------+-------+  Axillary       Full        Yes        Yes                         +----------+------------+---------+-----------+----------+-------+  Brachial                   Yes        Yes                         +----------+------------+---------+-----------+----------+-------+  Radial         Full                                               +----------+------------+---------+-----------+----------+-------+  Ulnar          Full                                                +----------+------------+---------+-----------+----------+-------+  Cephalic       Full                                               +----------+------------+---------+-----------+----------+-------+  Basilic        Full                                               +----------+------------+---------+-----------+----------+-------+  Summary:  Right: No evidence of thrombosis in the subclavian.  Left: No evidence of deep vein thrombosis in the upper extremity. No evidence of superficial vein thrombosis in the upper extremity.  *See table(s) above for measurements and observations.  Diagnosing physician: Ruta Hinds MD Electronically signed by Ruta Hinds MD on 03/17/2019 at 9:41:30 AM.    Final     Microbiology: Recent Results (from the past 240 hour(s))  Urine Culture     Status: Abnormal   Collection Time: 04/07/19  5:43 PM   Specimen: Urine, Random  Result Value Ref Range Status   Specimen Description URINE, RANDOM  Final   Special Requests   Final    ADDED 1610 04/08/2019 Performed at [facility], [address] [place], [place] [address]    Culture (A)  Final    >=100,000 COLONIES/mL ESCHERICHIA COLI Confirmed Extended Spectrum Beta-Lactamase Producer (ESBL).  In bloodstream infections from ESBL organisms, carbapenems are preferred over piperacillin/tazobactam. They are shown to have a lower risk of mortality.    Report Status 04/09/2019 FINAL  Final   Organism ID, Bacteria ESCHERICHIA COLI (A)  Final      Susceptibility   Escherichia coli - MIC*    AMPICILLIN >=32 RESISTANT Resistant     CEFAZOLIN >=64 RESISTANT Resistant     CEFTRIAXONE >=64 RESISTANT Resistant     CIPROFLOXACIN 0.5 SENSITIVE Sensitive     GENTAMICIN >=16 RESISTANT Resistant     IMIPENEM <=0.25 SENSITIVE Sensitive     NITROFURANTOIN <=16 SENSITIVE Sensitive     TRIMETH/SULFA >=320 RESISTANT Resistant     AMPICILLIN/SULBACTAM >=32 RESISTANT Resistant     PIP/TAZO <=4 SENSITIVE Sensitive     Extended  ESBL POSITIVE Resistant     * >=100,000 COLONIES/mL ESCHERICHIA COLI  SARS CORONAVIRUS 2 (TAT 6-24 HRS) Nasopharyngeal Nasopharyngeal Swab     Status: None   Collection Time: 04/07/19  5:44 PM   Specimen: Nasopharyngeal Swab  Result Value Ref Range Status   SARS Coronavirus 2 NEGATIVE NEGATIVE Final    Comment: (NOTE) SARS-CoV-2 target nucleic acids are NOT DETECTED. The SARS-CoV-2 RNA is generally detectable in upper and lower respiratory specimens during the acute phase of infection. Negative results do not preclude SARS-CoV-2 infection, do not rule out co-infections with other pathogens, and should not be used as the sole basis for treatment or other patient management decisions. Negative results must be combined with clinical observations, patient history, and epidemiological information. The expected result is Negative.  Fact Sheet for Patients: SugarRoll.be Fact Sheet for Healthcare Providers: https://www.woods-mathews.com/ This test is not yet approved or cleared by the [place] FDA and  has been authorized for detection and/or diagnosis of SARS-CoV-2 by FDA under an Emergency Use Authorization (EUA). This EUA will remain  in effect (meaning this test can be used) for the duration of the COVID-19 declaration under Section 56 4(b)(1) of the Act, 21 U.S.C. section 360bbb-3(b)(1), unless the authorization is terminated or revoked sooner. Performed at [facility], [place] [address], [place], [place] [address]   Gastrointestinal Panel by PCR , Stool     Status: None   Collection Time: 04/07/19 11:38 PM   Specimen: Stool  Result Value Ref Range Status   Campylobacter species NOT DETECTED NOT DETECTED Final   Plesimonas shigelloides NOT DETECTED NOT DETECTED Final   Salmonella species NOT DETECTED NOT DETECTED Final   Yersinia enterocolitica NOT DETECTED NOT DETECTED Final   Vibrio species NOT DETECTED NOT DETECTED Final    Vibrio cholerae NOT DETECTED NOT DETECTED Final   Enteroaggregative E coli (EAEC) NOT DETECTED NOT DETECTED Final   Enteropathogenic E coli (EPEC) NOT DETECTED NOT DETECTED Final   Enterotoxigenic E coli (ETEC) NOT DETECTED NOT DETECTED Final   Shiga like toxin producing E coli (STEC) NOT DETECTED NOT DETECTED Final   Shigella/Enteroinvasive E coli (EIEC) NOT DETECTED NOT DETECTED Final   Cryptosporidium NOT DETECTED NOT DETECTED Final   Cyclospora cayetanensis NOT DETECTED NOT DETECTED Final   Entamoeba histolytica NOT DETECTED NOT DETECTED Final   Giardia lamblia NOT DETECTED NOT DETECTED Final   Adenovirus F40/41 NOT DETECTED NOT DETECTED Final   Astrovirus NOT DETECTED NOT DETECTED Final   Norovirus GI/GII NOT DETECTED NOT DETECTED Final   Rotavirus A NOT DETECTED NOT DETECTED Final   Sapovirus (I, II, IV, and V) NOT DETECTED NOT DETECTED Final    Comment: Performed at [facility], [place] [place], [place] [address]  C difficile quick scan w PCR reflex     Status: None   Collection Time: 04/07/19 11:38 PM   Specimen: STOOL  Result Value Ref Range Status   C Diff antigen NEGATIVE NEGATIVE Final   C Diff toxin NEGATIVE NEGATIVE Final   C Diff interpretation No C. difficile detected.  Final    Comment: Performed at [facility], [place] [address] [place], [place] [address]  Respiratory Panel by PCR     Status: None   Collection Time: 04/08/19  1:12 PM   Specimen: Nasopharyngeal Swab; Respiratory  Result Value Ref Range Status   Adenovirus NOT DETECTED NOT DETECTED Final   Coronavirus 229E NOT DETECTED NOT DETECTED Final    Comment: (NOTE) The Coronavirus on the Respiratory Panel, DOES NOT test for the novel  Coronavirus (2019 nCoV)    Coronavirus HKU1 NOT DETECTED NOT DETECTED Final   Coronavirus NL63 NOT DETECTED NOT DETECTED Final   Coronavirus OC43 NOT DETECTED NOT DETECTED Final   Metapneumovirus NOT DETECTED NOT DETECTED Final   Rhinovirus /  Enterovirus NOT DETECTED NOT DETECTED Final   Influenza A NOT DETECTED NOT DETECTED Final   Influenza B NOT DETECTED NOT DETECTED Final   Parainfluenza Virus 1 NOT DETECTED NOT DETECTED Final   Parainfluenza Virus 2 NOT DETECTED NOT DETECTED Final   Parainfluenza Virus 3 NOT DETECTED NOT DETECTED Final   Parainfluenza Virus 4 NOT DETECTED NOT DETECTED Final   Respiratory Syncytial Virus NOT DETECTED NOT DETECTED Final   Bordetella  pertussis NOT DETECTED NOT DETECTED Final   Chlamydophila pneumoniae NOT DETECTED NOT DETECTED Final   Mycoplasma pneumoniae NOT DETECTED NOT DETECTED Final    Comment: Performed at [facility], [place] [address], [place], [place] [address]     Labs: Basic Metabolic Panel: Recent Labs  Lab 04/07/19 1502 04/07/19 2214 04/08/19 0049 04/09/19 0349 04/09/19 1150 04/10/19 0412 04/11/19 0443  NA 137  --  136 138  --  140 141  K 4.0  --  4.3 3.9  --  3.4* 3.2*  CL 102  --  103 107  --  103 105  CO2 18*  --  20* 22  --  23 25  GLUCOSE 129*  --  101* 100*  --  133* 103*  BUN 15  --  10 <5*  --  <5* 9  CREATININE 0.92  --  0.97 0.68  --  0.53 0.59  CALCIUM 8.7*  --  7.5* 8.1*  --  8.8* 9.0  MG  --  1.6* 1.2*  --  1.6* 2.2  --   PHOS  --   --  2.5  --   --   --   --    Liver Function Tests: Recent Labs  Lab 04/07/19 1502 04/08/19 0049 04/09/19 0349 04/10/19 0412 04/11/19 0443  AST 69* 58* 34 28 44*  ALT 32 '27 19 17 25  ' ALKPHOS 159* 132* 125 115 103  BILITOT 1.2 1.2 1.1 0.8 0.8  PROT 7.6 6.5 6.0* 6.5 5.9*  ALBUMIN 4.3 3.5 3.2* 3.3* 3.1*   Recent Labs  Lab 04/07/19 1502  LIPASE 25   No results for input(s): AMMONIA in the last 168 hours. CBC: Recent Labs  Lab 04/07/19 1502 04/08/19 0049 04/09/19 0349 04/10/19 0412  WBC 14.0* 9.7 6.5 5.6  HGB 15.8* 12.9 12.5 13.1  HCT 45.6 37.8 37.0 37.8  MCV 91.9 92.0 93.2 90.4  PLT 240 182 138* 152   Cardiac Enzymes: No results for input(s): CKTOTAL, CKMB, CKMBINDEX, TROPONINI in the last 168  hours. BNP: BNP (last 3 results) No results for input(s): BNP in the last 8760 hours.  ProBNP (last 3 results) No results for input(s): PROBNP in the last 8760 hours.  CBG: No results for input(s): GLUCAP in the last 168 hours.  Principal Problem:   CAP (community acquired pneumonia) Active Problems:   UTI (urinary tract infection)   SIRS (systemic inflammatory response syndrome) ([place])   Suspected COVID-19 virus infection   Viral gastroenteritis   Time coordinating discharge: 38 minutes.  Signed:        Duayne Brideau, DO [facility]  04/11/2019, 5:20 PM

## 2019-04-11 NOTE — Plan of Care (Signed)

## 2019-04-11 NOTE — Progress Notes (Signed)
Patient resting comfortably during shift report. Denies complaints.  

## 2019-04-13 ENCOUNTER — Telehealth: Payer: Self-pay

## 2019-04-13 NOTE — Telephone Encounter (Signed)
Pt. Was recently hospitilized. Has made a f/u for the visit but was concerned about medication changes made during her stay. Pt. Requested the doctor review the changes made

## 2019-04-20 ENCOUNTER — Telehealth (INDEPENDENT_AMBULATORY_CARE_PROVIDER_SITE_OTHER): Payer: 59 | Admitting: Family Medicine

## 2019-04-20 ENCOUNTER — Other Ambulatory Visit: Payer: Self-pay

## 2019-04-20 DIAGNOSIS — Z87448 Personal history of other diseases of urinary system: Secondary | ICD-10-CM

## 2019-04-20 DIAGNOSIS — Z87440 Personal history of urinary (tract) infections: Secondary | ICD-10-CM | POA: Diagnosis not present

## 2019-04-20 MED ORDER — ONDANSETRON HCL 8 MG PO TABS: 8.0000 mg | ORAL_TABLET | Freq: Three times a day (TID) | ORAL | 0 refills | Status: DC | PRN

## 2019-04-20 NOTE — Progress Notes (Signed)
Pt changed ED f/u ov to telmed, not feeling well at all. Says she is very nauseous and had avery bad migraine last night. Has not eaten much. Crackers and water since lat night. She was sent home from the hosp with multi vitamins, doxycycline, and folic acid. Also had a severe reaction to levaquin while in the hosp.

## 2019-04-20 NOTE — Progress Notes (Signed)
Virtual Visit Note  I connected with patient on 04/20/19 at 445pm by phone and verified that I am speaking with the correct person using two identifiers. Vicki [name] is currently located at home and patient is currently with them during visit. The provider, Rutherford Guys, MD is located in their office at time of visit.  I discussed the limitations, risks, security and privacy concerns of performing an evaluation and management service by telephone and the availability of in person appointments. I also discussed with the patient that there may be a patient responsible charge related to this service. The patient expressed understanding and agreed to proceed.   CC: hosp f/u  HPI ? Hosp nov 24-28th for pyelonephritis ESBL e coli, SIRS Patient was doing much better for past several days until yesterday when she woke up feeling very nauseous Starting vomiting last night and then again today No diarrhea Has been drinking plenty of water Completed doxycycline Feels intermittent feverish but no actual temp No dysuria, urgency, frequency, urine still looks cloudy Took antinausea meds Currently feeling better  Allergies  Allergen Reactions  . Jasmine Oil Shortness Of Breath and Swelling  . Levaquin [Levofloxacin] Shortness Of Breath    Rash, swelling, itching  . Penicillins Anaphylaxis and Hives    Tolerated ceftriaxone  . Aleve [Naproxen Sodium] Itching  . Benadryl [Diphenhydramine] Other (See Comments)    hallucinations  . Ceftin [Cefuroxime Axetil] Other (See Comments)    unsure  . Depo-Provera [Medroxyprogesterone Acetate] Other (See Comments)    Severe acne  . Doxycycline     Raises blood pressure   . Oxycodone Nausea And Vomiting  . Tramadol Other (See Comments)    Kept her up all night    Prior to Admission medications   Medication Sig Start Date End Date Taking? Authorizing Provider  acetaminophen (TYLENOL) 325 MG tablet Take 650 mg by mouth every 6 (six) hours  as needed.   Yes [provider]  albuterol (VENTOLIN HFA) 108 (90 Base) MCG/ACT inhaler Inhale 2 puffs into the lungs every 4 (four) hours as needed for wheezing or shortness of breath. 03/12/19  Yes Rutherford Guys, MD  ALPRAZolam Duanne Moron) 0.25 MG tablet Take 1 tablet (0.25 mg total) by mouth 3 (three) times daily as needed for anxiety. 04/11/19  Yes Swayze, Ava, DO  cetirizine (ZYRTEC) 10 MG tablet Take 1 tablet (10 mg total) by mouth daily. 07/19/17  Yes McVey, Gelene Mink, PA-C  citalopram (CELEXA) 20 MG tablet Take 3 tablets (60 mg total) by mouth daily. 03/12/19  Yes Rutherford Guys, MD  eletriptan (RELPAX) 40 MG tablet TAKE 1 TAB BY MOUTH AT ONSET OF MIGRAINE OR HEADACHE AS NEEDED. MAY REPEAT IN 2 HOURS IF PERSISTS Patient taking differently: Take 40 mg by mouth See admin instructions. Take 40mg  at onset of migraine or headache as needed. May repeat in 2 hours if symptoms persist. 03/12/19  Yes Rutherford Guys, MD  Fluticasone-Salmeterol (ADVAIR DISKUS) 500-50 MCG/DOSE AEPB INHALE 1 PUFF INTO THE LUNGS TWICE A DAY Patient taking differently: Inhale 1 puff into the lungs 2 (two) times daily.  06/18/18  Yes McVey, Gelene Mink, PA-C  folic acid (FOLVITE) 1 MG tablet Take 1 tablet (1 mg total) by mouth daily. 04/12/19  Yes Swayze, Ava, DO  glycopyrrolate (ROBINUL) 1 MG tablet TAKE 1 TABLET BY MOUTH EVERY EVENING Patient taking differently: Take 1 mg by mouth every evening.  10/19/18  Yes Rutherford Guys, MD  Multiple Vitamin (MULTIVITAMIN  WITH MINERALS) TABS tablet Take 1 tablet by mouth daily. 04/12/19  Yes Swayze, Ava, DO  ondansetron (ZOFRAN) 8 MG tablet Take 1 tablet (8 mg total) by mouth every 8 (eight) hours as needed for nausea or vomiting. 12/18/18  Yes Rutherford Guys, MD  rOPINIRole (REQUIP) 1 MG tablet Take 1 tablet (1 mg total) by mouth at bedtime. 06/16/18  Yes Rutherford Guys, MD  thiamine 100 MG tablet Take 1 tablet (100 mg total) by mouth daily. 04/12/19  Yes  Swayze, Ava, DO  tiotropium (SPIRIVA HANDIHALER) 18 MCG inhalation capsule Place 1 capsule (18 mcg total) into inhaler and inhale daily. 01/01/18  Yes McVey, Gelene Mink, PA-C  traZODone (DESYREL) 50 MG tablet TAKE 1 TABLET BY MOUTH EVERYDAY AT BEDTIME Patient taking differently: Take 50 mg by mouth at bedtime.  12/22/18  Yes Rutherford Guys, MD  umeclidinium bromide (INCRUSE ELLIPTA) 62.5 MCG/INH AEPB Inhale 1 puff into the lungs daily. 09/17/18  Yes Rutherford Guys, MD  ibuprofen (ADVIL,MOTRIN) 800 MG tablet Take 1 tablet (800 mg total) by mouth every 8 (eight) hours as needed for mild pain. 04/22/18   Rutherford Guys, MD    Past Medical History:  Diagnosis Date  . Asthma   . Carpal tunnel syndrome   . COPD (chronic obstructive pulmonary disease) ([place])   . Diverticulitis     Past Surgical History:  Procedure Laterality Date  . ABDOMINAL HYSTERECTOMY  2009  . APPENDECTOMY    . ARTHROSCOPIC REPAIR ACL Left 2011   x 2  . BUNIONECTOMY  2006   both  . CARPAL TUNNEL RELEASE Bilateral 10/28/2013   Procedure: BILATERAL CARPAL TUNNEL RELEASE;  Surgeon: Wynonia Sours, MD;  Location: [facility];  Service: Orthopedics;  Laterality: Bilateral;  . DILATION AND CURETTAGE OF UTERUS    . TUBAL LIGATION  2004    Social History   Tobacco Use  . Smoking status: Current Every Day Smoker    Packs/day: 0.50    Years: 34.00    Pack years: 17.00  . Smokeless tobacco: Never Used  Substance Use Topics  . Alcohol use: Yes    Comment: everyday    Family History  Problem Relation Age of Onset  . Cancer Mother   . Cancer Sister 79       breast cancer  . Breast cancer Sister   . Breast cancer Sister     ROS Per hpi  Objective  Vitals as reported by the patient: none    ASSESSMENT and PLAN  1. History of acute pyelonephritis Concerns for incomplete treatment given return of vomiting and malaise. Checking urine. ER precautions given.  - Urine Culture; Future -  Urinalysis, Routine w reflex microscopic; Future  Other orders - ondansetron (ZOFRAN) 8 MG tablet; Take 1 tablet (8 mg total) by mouth every 8 (eight) hours as needed for nausea or vomiting.  FOLLOW-UP: prn   The above assessment and management plan was discussed with the patient. The patient verbalized understanding of and has agreed to the management plan. Patient is aware to call the clinic if symptoms persist or worsen. Patient is aware when to return to the clinic for a follow-up visit. Patient educated on when it is appropriate to go to the emergency department.    I provided 12 minutes of non-face-to-face time during this encounter.  Rutherford Guys, MD Primary Care at [place],  [address] Ph.  [phone] Fax [phone]

## 2019-04-21 NOTE — Telephone Encounter (Signed)
Please advise per pt request.

## 2019-04-21 NOTE — Telephone Encounter (Signed)
Patient's concern/request has been addressed 

## 2019-04-23 ENCOUNTER — Ambulatory Visit
Admission: RE | Admit: 2019-04-23 | Discharge: 2019-04-23 | Disposition: A | Discharge status: Home / Self Care | Payer: 59 | Source: Ambulatory Visit | Attending: Family Medicine | Admitting: Family Medicine

## 2019-04-23 ENCOUNTER — Ambulatory Visit (INDEPENDENT_AMBULATORY_CARE_PROVIDER_SITE_OTHER): Payer: 59 | Admitting: Family Medicine

## 2019-04-23 ENCOUNTER — Other Ambulatory Visit: Payer: Self-pay

## 2019-04-23 DIAGNOSIS — Z87440 Personal history of urinary (tract) infections: Secondary | ICD-10-CM

## 2019-04-23 DIAGNOSIS — Z87448 Personal history of other diseases of urinary system: Secondary | ICD-10-CM

## 2019-04-23 DIAGNOSIS — Z1231 Encounter for screening mammogram for malignant neoplasm of breast: Secondary | ICD-10-CM

## 2019-04-24 LAB — URINALYSIS, ROUTINE W REFLEX MICROSCOPIC
Bilirubin, UA: NEGATIVE
Glucose, UA: NEGATIVE
Leukocytes,UA: NEGATIVE
Nitrite, UA: NEGATIVE
RBC, UA: NEGATIVE
Specific Gravity, UA: 1.029 (ref 1.005–1.030)
Urobilinogen, Ur: 0.2 mg/dL (ref 0.2–1.0)
pH, UA: 5.5 (ref 5.0–7.5)

## 2019-04-24 LAB — MICROSCOPIC EXAMINATION: Epithelial Cells (non renal): >10 /hpf — AB (ref 0–10)

## 2019-04-24 LAB — URINE CULTURE

## 2019-04-30 ENCOUNTER — Other Ambulatory Visit: Payer: Self-pay | Admitting: Family Medicine

## 2019-04-30 DIAGNOSIS — R61 Generalized hyperhidrosis: Secondary | ICD-10-CM

## 2019-05-01 ENCOUNTER — Other Ambulatory Visit: Payer: Self-pay | Admitting: Family Medicine

## 2019-05-05 ENCOUNTER — Telehealth: Payer: Self-pay | Admitting: Family Medicine

## 2019-05-05 ENCOUNTER — Other Ambulatory Visit: Payer: Self-pay | Admitting: Family Medicine

## 2019-05-05 DIAGNOSIS — G2581 Restless legs syndrome: Secondary | ICD-10-CM

## 2019-05-05 NOTE — Telephone Encounter (Signed)
Copied from [place] [id number]. Topic: Quick Communication - See Telephone Encounter >> May 05, 2019 11:08 AM Loma Boston wrote: CRM for notification. See Telephone encounter for: 05/05/19. Pt needs to get a refill for an Epipen was just in [facility] and had some allergic reactions please fu about the pen [id number] [place], [place] - [address]  Phone:  [phone]  Fax:  [phone] Pt is expressing concern about her reactions with COVID vaccine and after her recent hospital visit want nurse call back

## 2019-05-05 NOTE — Telephone Encounter (Signed)
Copied from [place] [id number]. Topic: Quick Communication - See Telephone Encounter >> May 05, 2019 11:08 AM Loma [place] wrote: CRM for notification. See Telephone encounter for: 05/05/19. Pt needs to get a refill for an Epipen was just in [facility] and had some allergic reactions please fu about the pen [id number] [place], [place] - [address]  Phone:  [phone]  Fax:  [phone] Pt is expressing concern about her reactions with COVID vaccine and after her recent hospital visit want nurse call back

## 2019-05-06 NOTE — Telephone Encounter (Signed)
Please advise 

## 2019-05-07 NOTE — Telephone Encounter (Signed)
She does not need an  epi pen as she had issues with shingrix vaccine. Also after reading her ER notes, I am not sure if she indeed had an allergic reaction to shingrix, as it is known to cause significant local redness and pain. thanks

## 2019-05-11 NOTE — Telephone Encounter (Signed)
LVM for pt to return call to the office.

## 2019-05-28 ENCOUNTER — Other Ambulatory Visit: Payer: Self-pay | Admitting: Family Medicine

## 2019-05-28 DIAGNOSIS — G4700 Insomnia, unspecified: Secondary | ICD-10-CM

## 2019-05-28 NOTE — Telephone Encounter (Signed)
Requested medication (s) are due for refill today:   Minipress was D/C'd on 03/12/2019.   Trazodone due for refill  Requested medication (s) are on the active medication list:   Minipress is not;   Trazodone is.  Future visit scheduled:   Yes in 2 weeks with Dr. Pamella Pert   Last ordered: Minipress has been discontinued.      Trazodone 12/22/2018  #90 1 refill.    When I attempt to refuse the refill for the discontinued Minipress it also discontinues the Trazodone which I do not want to do.     Requested Prescriptions  Pending Prescriptions Disp Refills   prazosin (MINIPRESS) 1 MG capsule [Pharmacy Med Name: PRAZOSIN 1 MG CAPSULE] 180 capsule     Sig: TAKE 1 TO 2 CAPSULES (1 TO 2 MG TOTAL) BY MOUTH AT BEDTIME.      Cardiovascular:  Alpha Blockers Failed - 05/28/2019  8:00 AM      Failed - Last BP in normal range    BP Readings from Last 1 Encounters:  04/11/19 (!) 135/95          Passed - Valid encounter within last 6 months    Recent Outpatient Visits           1 month ago History of acute pyelonephritis   [facility], Lilia Argue, MD   1 month ago History of acute pyelonephritis   [facility], Lilia Argue, MD   2 months ago Annual physical exam   [facility], Lilia Argue, MD   5 months ago Generalized anxiety disorder   [facility], Lilia Argue, MD   6 months ago Fatigue, unspecified type   [facility] at [place], MD       Future Appointments             In 2 weeks Rutherford Guys, MD Primary [facility] at [facility] [place]              traZODone (DESYREL) 50 MG tablet [Pharmacy Med Name: TRAZODONE 50 MG TABLET] 90 tablet 0    Sig: Take 1 tablet (50 mg total) by mouth at bedtime.      Psychiatry: Antidepressants - Serotonin Modulator Passed - 05/28/2019  8:00 AM      Passed - Completed PHQ-2 or PHQ-9 in the last 360 days.      Passed - Valid encounter within last 6 months    Recent Outpatient Visits            1 month ago History of acute pyelonephritis   [facility] at [facility], Lilia Argue, MD   1 month ago History of acute pyelonephritis   [facility], Lilia Argue, MD   2 months ago Annual physical exam   [facility], Lilia Argue, MD   5 months ago Generalized anxiety disorder   [facility], Lilia Argue, MD   6 months ago Fatigue, unspecified type   Primary Care at [facility], Rex Kras, MD       Future Appointments             In 2 weeks Rutherford Guys, MD Primary Care at [place], [facility]

## 2019-05-29 LAB — COLOGUARD: Cologuard: NEGATIVE

## 2019-06-02 MED ORDER — TRAZODONE HCL 50 MG PO TABS: ORAL_TABLET | ORAL | 1 refills | Status: DC

## 2019-06-11 ENCOUNTER — Ambulatory Visit: Payer: 59 | Admitting: Family Medicine

## 2019-06-18 ENCOUNTER — Other Ambulatory Visit: Payer: Self-pay

## 2019-06-18 ENCOUNTER — Encounter: Payer: 59 | Admitting: Family Medicine

## 2019-06-18 NOTE — Progress Notes (Signed)
Three month follow up. Pt says she is having no medical concerns at this time. No refills needed. Phq9 completed

## 2019-06-18 NOTE — Progress Notes (Signed)
callled twice, phone when immediately to VM Informed patient that she will need to reschedule but that if she needed refills until our next OV to please request them   This encounter was created in error - please disregard.

## 2019-06-24 ENCOUNTER — Encounter: Payer: Self-pay | Admitting: Family Medicine

## 2019-06-27 ENCOUNTER — Other Ambulatory Visit: Payer: Self-pay | Admitting: Family Medicine

## 2019-06-27 DIAGNOSIS — G2581 Restless legs syndrome: Secondary | ICD-10-CM

## 2019-06-27 NOTE — Telephone Encounter (Signed)
Requested Prescriptions  Pending Prescriptions Disp Refills  . rOPINIRole (REQUIP) 1 MG tablet [Pharmacy Med Name: ROPINIROLE HCL 1 MG TABLET] 30 tablet 0    Sig: TAKE 1 TABLET (1 MG TOTAL) BY MOUTH AT BEDTIME.     Neurology:  Parkinsonian Agents Failed - 06/27/2019  1:35 PM      Failed - Last BP in normal range    BP Readings from Last 1 Encounters:  04/11/19 (!) 135/95         Passed - Valid encounter within last 12 months    Recent Outpatient Visits          2 months ago History of acute pyelonephritis   [facility], Lilia Argue, MD   2 months ago History of acute pyelonephritis   [facility], Lilia Argue, MD   3 months ago Annual physical exam   [facility], Lilia Argue, MD   6 months ago Generalized anxiety disorder   [facility], Lilia Argue, MD   7 months ago Fatigue, unspecified type   Primary Care at [facility], Rex Kras, MD

## 2019-07-14 ENCOUNTER — Other Ambulatory Visit: Payer: Self-pay

## 2019-07-14 ENCOUNTER — Ambulatory Visit: Payer: 59 | Admitting: Family Medicine

## 2019-07-14 ENCOUNTER — Encounter: Payer: Self-pay | Admitting: Gastroenterology

## 2019-07-14 ENCOUNTER — Encounter: Payer: Self-pay | Admitting: Family Medicine

## 2019-07-14 VITALS — BP 138/98 | HR 104 | Temp 98.2°F | Resp 18 | Ht 63.0 in | Wt 161.0 lb

## 2019-07-14 DIAGNOSIS — R197 Diarrhea, unspecified: Secondary | ICD-10-CM

## 2019-07-14 DIAGNOSIS — G2581 Restless legs syndrome: Secondary | ICD-10-CM

## 2019-07-14 DIAGNOSIS — J449 Chronic obstructive pulmonary disease, unspecified: Secondary | ICD-10-CM

## 2019-07-14 DIAGNOSIS — F411 Generalized anxiety disorder: Secondary | ICD-10-CM | POA: Insufficient documentation

## 2019-07-14 DIAGNOSIS — R1115 Cyclical vomiting syndrome unrelated to migraine: Secondary | ICD-10-CM

## 2019-07-14 DIAGNOSIS — E785 Hyperlipidemia, unspecified: Secondary | ICD-10-CM | POA: Insufficient documentation

## 2019-07-14 DIAGNOSIS — E782 Mixed hyperlipidemia: Secondary | ICD-10-CM

## 2019-07-14 DIAGNOSIS — F17200 Nicotine dependence, unspecified, uncomplicated: Secondary | ICD-10-CM | POA: Diagnosis not present

## 2019-07-14 DIAGNOSIS — F329 Major depressive disorder, single episode, unspecified: Secondary | ICD-10-CM

## 2019-07-14 DIAGNOSIS — F32A Depression, unspecified: Secondary | ICD-10-CM

## 2019-07-14 DIAGNOSIS — Z87440 Personal history of urinary (tract) infections: Secondary | ICD-10-CM

## 2019-07-14 DIAGNOSIS — Z720 Tobacco use: Secondary | ICD-10-CM | POA: Insufficient documentation

## 2019-07-14 DIAGNOSIS — I10 Essential (primary) hypertension: Secondary | ICD-10-CM | POA: Insufficient documentation

## 2019-07-14 DIAGNOSIS — Z87448 Personal history of other diseases of urinary system: Secondary | ICD-10-CM

## 2019-07-14 DIAGNOSIS — R945 Abnormal results of liver function studies: Secondary | ICD-10-CM

## 2019-07-14 HISTORY — DX: Essential (primary) hypertension: I10

## 2019-07-14 LAB — POCT URINALYSIS DIP (MANUAL ENTRY)
Bilirubin, UA: NEGATIVE
Blood, UA: NEGATIVE
Glucose, UA: NEGATIVE mg/dL
Ketones, POC UA: NEGATIVE mg/dL
Leukocytes, UA: NEGATIVE
Nitrite, UA: NEGATIVE
Spec Grav, UA: 1.02 (ref 1.010–1.025)
Urobilinogen, UA: 0.2 E.U./dL
pH, UA: 6 (ref 5.0–8.0)

## 2019-07-14 MED ORDER — GLYCOPYRROLATE 1 MG PO TABS: ORAL_TABLET | ORAL | 1 refills | Status: DC

## 2019-07-14 MED ORDER — AMLODIPINE BESYLATE 5 MG PO TABS: 5.0000 mg | ORAL_TABLET | Freq: Every day | ORAL | 3 refills | Status: DC

## 2019-07-14 MED ORDER — FLUTICASONE-SALMETEROL 500-50 MCG/DOSE IN AEPB: INHALATION_SPRAY | RESPIRATORY_TRACT | 1 refills | Status: DC

## 2019-07-14 MED ORDER — CITALOPRAM HYDROBROMIDE 20 MG PO TABS: 60.0000 mg | ORAL_TABLET | Freq: Every day | ORAL | 2 refills | Status: DC

## 2019-07-14 MED ORDER — ROPINIROLE HCL 1 MG PO TABS: 1.0000 mg | ORAL_TABLET | Freq: Every day | ORAL | 0 refills | Status: DC

## 2019-07-14 MED ORDER — CHANTIX STARTING MONTH PAK 0.5 MG X 11 & 1 MG X 42 PO TABS: ORAL_TABLET | ORAL | 0 refills | Status: DC

## 2019-07-14 NOTE — Patient Instructions (Signed)
° ° ° °  If you have lab work done today you will be contacted with your lab results within the next 2 weeks.  If you have not heard from us then please contact us. The fastest way to get your results is to register for My Chart. ° ° °IF you received an x-ray today, you will receive an invoice from [facility]. Please contact [facility] at [phone] with questions or concerns regarding your invoice.  ° °IF you received labwork today, you will receive an invoice from [facility]. Please contact [facility] at [phone] with questions or concerns regarding your invoice.  ° °Our billing staff will not be able to assist you with questions regarding bills from these companies. ° °You will be contacted with the lab results as soon as they are available. The fastest way to get your results is to activate your My Chart account. Instructions are located on the last page of this paperwork. If you have not heard from us regarding the results in 2 weeks, please contact this office. °  ° ° ° °

## 2019-07-14 NOTE — Progress Notes (Signed)
3/2/20211:30 PM  Vicki [name] 05/28/1965, 54 y.o., female [id number]  Chief Complaint  Patient presents with  . Follow-up    urinary concerns    HPI:   Patient is a 54 y.o. female with past medical history significant for COPD, HTN, HLP, anxiety, diverticulitis, RLS  who presents today for routine followup  Last OV dec 2020 - telemedicine for hosp followup for pyelo  COPD: she reports that it overall has been doing well, except for past 2 days has had increased SOB with wheezing, albuterol helps but normal cough cough and phelgm, adviar, incruse, Restarted smoking when her son became homeless, somedays over 1/2 pack, has quit with chantix in the past She does not have pulm  Also having worsening sinus headaches and nasal congestion with clear rhinorrhea, using flonase and OTC sinus meds  Anxiety and depression: depression is well controlled on celexa 60mg  but anxiety not well controlled, reports trial of buspar many years ago and thinks she was intolerant to it, has been taking xanax daily of recent (1/2 tab BID) She has been going to her son's NA meetings but she has not been going to [place] Sleeps well with trazadone  RLS: well controlled on gabapentin and ropinerole  Cyclic vomiting and diarrhea: having recurring 2-3 days of diarrhea, nausea and vomiting x 3 years, has not had GI evaluation as she has always thought it was related to anxiety Uses zofran prn Drinks about 6 wine cooler over the weekend Normal cologuard Jan 2021  HTN/HLP: She is fasting today She has been trying to work on [employer]  Lab Results  Component Value Date   CREATININE 0.59 04/11/2019   BUN 9 04/11/2019   NA 141 04/11/2019   K 3.2 (L) 04/11/2019   CL 105 04/11/2019   CO2 25 04/11/2019   Lab Results  Component Value Date   CHOL 216 (H) 01/17/2018   HDL 61 01/17/2018   LDLCALC 76 01/17/2018   TRIG 394 (H) 01/17/2018   CHOLHDL 3.5 01/17/2018    Depression screen PHQ 2/9 07/14/2019  06/18/2019 04/20/2019  Decreased Interest 0 1 0  Down, Depressed, Hopeless 0 1 0  PHQ - 2 Score 0 2 0  Altered sleeping - 3 -  Tired, decreased energy - 1 -  Change in appetite - 0 -  Feeling bad or failure about yourself  - 0 -  Trouble concentrating - 1 -  Moving slowly or fidgety/restless - 1 -  Suicidal thoughts - 0 -  PHQ-9 Score - 8 -  Difficult doing work/chores - Not difficult at all -    Fall Risk  07/14/2019 06/18/2019 04/20/2019 03/12/2019 12/18/2018  Falls in the past year? 1 0 0 0 0  Number falls in past yr: 0 0 0 0 0  Injury with Fall? 1 0 0 0 0  Follow up Falls evaluation completed - - - -     Allergies  Allergen Reactions  . Jasmine Oil Shortness Of Breath and Swelling  . Levaquin [Levofloxacin] Shortness Of Breath    Rash, swelling, itching  . Penicillins Anaphylaxis and Hives    Tolerated ceftriaxone  . Aleve [Naproxen Sodium] Itching  . Benadryl [Diphenhydramine] Other (See Comments)    hallucinations  . Ceftin [Cefuroxime Axetil] Other (See Comments)    unsure  . Depo-Provera [Medroxyprogesterone Acetate] Other (See Comments)    Severe acne  . Doxycycline     Raises blood pressure   . Oxycodone Nausea And Vomiting  . Tramadol  Other (See Comments)    Kept her up all night    Prior to Admission medications   Medication Sig Start Date End Date Taking? Authorizing Provider  acetaminophen (TYLENOL) 325 MG tablet Take 650 mg by mouth every 6 (six) hours as needed.   Yes [provider]  albuterol (VENTOLIN HFA) 108 (90 Base) MCG/ACT inhaler Inhale 2 puffs into the lungs every 4 (four) hours as needed for wheezing or shortness of breath. 03/12/19  Yes Rutherford Guys, MD  ALPRAZolam Duanne Moron) 0.25 MG tablet Take 1 tablet (0.25 mg total) by mouth 3 (three) times daily as needed for anxiety. 04/11/19  Yes Swayze, Ava, DO  cetirizine (ZYRTEC) 10 MG tablet Take 1 tablet (10 mg total) by mouth daily. 07/19/17  Yes McVey, Gelene Mink, PA-C  citalopram  (CELEXA) 20 MG tablet Take 3 tablets (60 mg total) by mouth daily. 03/12/19  Yes Rutherford Guys, MD  eletriptan (RELPAX) 40 MG tablet TAKE 1 TAB BY MOUTH AT ONSET OF MIGRAINE OR HEADACHE AS NEEDED. MAY REPEAT IN 2 HOURS IF PERSISTS Patient taking differently: Take 40 mg by mouth See admin instructions. Take 40mg  at onset of migraine or headache as needed. May repeat in 2 hours if symptoms persist. 03/12/19  Yes Rutherford Guys, MD  Fluticasone-Salmeterol (ADVAIR DISKUS) 500-50 MCG/DOSE AEPB INHALE 1 PUFF INTO THE LUNGS TWICE A DAY Patient taking differently: Inhale 1 puff into the lungs 2 (two) times daily.  06/18/18  Yes McVey, Gelene Mink, PA-C  glycopyrrolate (ROBINUL) 1 MG tablet TAKE 1 TABLET BY MOUTH EVERY DAY IN THE EVENING 04/30/19  Yes Rutherford Guys, MD  Multiple Vitamin (MULTIVITAMIN WITH MINERALS) TABS tablet Take 1 tablet by mouth daily. 04/12/19  Yes Swayze, Ava, DO  ondansetron (ZOFRAN) 8 MG tablet Take 1 tablet (8 mg total) by mouth every 8 (eight) hours as needed for nausea or vomiting. 04/20/19  Yes Rutherford Guys, MD  rOPINIRole (REQUIP) 1 MG tablet TAKE 1 TABLET (1 MG TOTAL) BY MOUTH AT BEDTIME. 06/27/19  Yes Rutherford Guys, MD  tiotropium (SPIRIVA HANDIHALER) 18 MCG inhalation capsule Place 1 capsule (18 mcg total) into inhaler and inhale daily. 01/01/18  Yes McVey, Gelene Mink, PA-C  traZODone (DESYREL) 50 MG tablet TAKE 1 TABLET BY MOUTH EVERYDAY AT BEDTIME 06/02/19  Yes Rutherford Guys, MD  umeclidinium bromide (INCRUSE ELLIPTA) 62.5 MCG/INH AEPB Inhale 1 puff into the lungs daily. 09/17/18  Yes Rutherford Guys, MD  ALPRAZolam Duanne Moron) 1 MG tablet Take 0.5-1 mg by mouth daily as needed. 05/28/19   [provider]  gabapentin (NEURONTIN) 300 MG capsule  06/30/19   [provider]  thiamine 100 MG tablet Take 1 tablet (100 mg total) by mouth daily. Patient not taking: Reported on 07/14/2019 04/12/19   Swayze, Ava, DO  ibuprofen (ADVIL,MOTRIN) 800 MG  tablet Take 1 tablet (800 mg total) by mouth every 8 (eight) hours as needed for mild pain. 04/22/18   Rutherford Guys, MD    Past Medical History:  Diagnosis Date  . Asthma   . Carpal tunnel syndrome   . COPD (chronic obstructive pulmonary disease) ([place])   . Diverticulitis     Past Surgical History:  Procedure Laterality Date  . ABDOMINAL HYSTERECTOMY  2009  . APPENDECTOMY    . ARTHROSCOPIC REPAIR ACL Left 2011   x 2  . BUNIONECTOMY  2006   both  . CARPAL TUNNEL RELEASE Bilateral 10/28/2013   Procedure: BILATERAL CARPAL TUNNEL RELEASE;  Surgeon: Wynonia Sours, MD;  Location: [facility];  Service: Orthopedics;  Laterality: Bilateral;  . DILATION AND CURETTAGE OF UTERUS    . TUBAL LIGATION  2004    Social History   Tobacco Use  . Smoking status: Current Every Day Smoker    Packs/day: 0.50    Years: 34.00    Pack years: 17.00  . Smokeless tobacco: Never Used  Substance Use Topics  . Alcohol use: Yes    Comment: everyday    Family History  Problem Relation Age of Onset  . Cancer Mother   . Cancer Sister 54       breast cancer  . Breast cancer Sister   . Breast cancer Sister     Review of Systems  Constitutional: Negative for chills and fever.  Cardiovascular: Negative for chest pain, palpitations and leg swelling.  Gastrointestinal: Positive for diarrhea, nausea and vomiting. Negative for abdominal pain, blood in stool, heartburn and melena.   Per hpi  OBJECTIVE:  Today's Vitals   07/14/19 1323  BP: (!) 142/98  Pulse: (!) 104  Resp: 18  Temp: 98.2 F (36.8 C)  TempSrc: Temporal  SpO2: 91%  Weight: 161 lb (73 kg)  Height: 5\' 3"  (1.6 m)   Body mass index is 28.52 kg/m.  BP Readings from Last 3 Encounters:  07/14/19 (!) 142/98  04/11/19 (!) 135/95  03/16/19 (!) 116/102     Physical Exam Vitals and nursing note reviewed.  Constitutional:      Appearance: She is well-developed.  HENT:     Head: Normocephalic and atraumatic.      Mouth/Throat:     Pharynx: No oropharyngeal exudate.  Eyes:     General: No scleral icterus.    Conjunctiva/sclera: Conjunctivae normal.     Pupils: Pupils are equal, round, and reactive to light.  Cardiovascular:     Rate and Rhythm: Normal rate and regular rhythm.     Heart sounds: Normal heart sounds. No murmur. No friction rub. No gallop.   Pulmonary:     Effort: Pulmonary effort is normal.     Breath sounds: Normal breath sounds. No wheezing, rhonchi or rales.  Abdominal:     General: Bowel sounds are normal. There is no distension.     Palpations: Abdomen is soft.     Tenderness: There is no abdominal tenderness.     Hernia: No hernia is present.  Musculoskeletal:     Cervical back: Neck supple.  Skin:    General: Skin is warm and dry.  Neurological:     Mental Status: She is alert and oriented to person, place, and time.     Results for orders placed or performed in visit on 07/14/19 (from the past 24 hour(s))  POCT urinalysis dipstick     Status: Abnormal   Collection Time: 07/14/19  1:42 PM  Result Value Ref Range   Color, UA yellow yellow   Clarity, UA clear clear   Glucose, UA negative negative mg/dL   Bilirubin, UA negative negative   Ketones, POC UA negative negative mg/dL   Spec Grav, UA 1.020 1.010 - 1.025   Blood, UA negative negative   pH, UA 6.0 5.0 - 8.0   Protein Ur, POC trace (A) negative mg/dL   Urobilinogen, UA 0.2 0.2 or 1.0 E.U./dL   Nitrite, UA Negative Negative   Leukocytes, UA Negative Negative    No results found.   ASSESSMENT and PLAN  1. Generalized anxiety disorder 2. Depression,  unspecified depression type Situational anxiety. Discussed counseling and al-anon. Discussed etoh cutting back.  - citalopram (CELEXA) 20 MG tablet; Take 3 tablets (60 mg total) by mouth daily.  3. Chronic obstructive pulmonary disease, unspecified COPD type ([place]) Stable. Referring to pulm for further eval and treatment. Discussed importance of smoking  cessation, trial of chantix, reviewed r/se/b - Fluticasone-Salmeterol (ADVAIR DISKUS) 500-50 MCG/DOSE AEPB; INHALE 1 PUFF INTO THE LUNGS TWICE A DAY - Ambulatory referral to Pulmonology  4. Tobacco use disorder See above  5. Essential hypertension, benign Uncontrolled. Starting amlodipine 5mg  daily, reviewed r/se/b, discussed LFM, incl salt restrictions  6. Cyclical vomiting with nausea 7. Diarrhea, unspecified type zofran prn sx relief, referral to GI for eval and treatment. - Ambulatory referral to Gastroenterology  8. RLS (restless legs syndrome) Controlled. Continue current regime.  - rOPINIRole (REQUIP) 1 MG tablet; Take 1 tablet (1 mg total) by mouth at bedtime.  9. History of acute pyelonephritis - POCT urinalysis dipstick - no signs of infection  10. Elevated triglycerides with high cholesterol Checking labs today, medications will be started as needed.  - Comprehensive metabolic panel - Lipid panel - Lipase  Other orders - ALPRAZolam (XANAX) 1 MG tablet; Take 0.5-1 mg by mouth daily as needed. - gabapentin (NEURONTIN) 300 MG capsule - varenicline (CHANTIX STARTING MONTH PAK) 0.5 MG X 11 & 1 MG X 42 tablet; Take one 0.5 mg tablet by mouth once daily for 3 days, then increase to one 0.5 mg tablet twice daily for 4 days, then increase to one 1 mg tablet twice daily. - amLODipine (NORVASC) 5 MG tablet; Take 1 tablet (5 mg total) by mouth daily.  Return in about 4 weeks (around 08/11/2019).    Rutherford Guys, MD [facility] at [facility] [place], [place] [address] Ph.  [phone] Fax [phone]

## 2019-07-15 LAB — LIPID PANEL
Chol/HDL Ratio: 3.1 ratio (ref 0.0–4.4)
Cholesterol, Total: 207 mg/dL — ABNORMAL HIGH (ref 100–199)
HDL: 66 mg/dL (ref >39)
LDL Chol Calc (NIH): 96 mg/dL (ref 0–99)
Triglycerides: 274 mg/dL — ABNORMAL HIGH (ref 0–149)
VLDL Cholesterol Cal: 45 mg/dL — ABNORMAL HIGH (ref 5–40)

## 2019-07-15 LAB — COMPREHENSIVE METABOLIC PANEL
ALT: 38 IU/L — ABNORMAL HIGH (ref 0–32)
AST: 51 IU/L — ABNORMAL HIGH (ref 0–40)
Albumin/Globulin Ratio: 1.4 (ref 1.2–2.2)
Albumin: 4.2 g/dL (ref 3.8–4.9)
Alkaline Phosphatase: 145 IU/L — ABNORMAL HIGH (ref 39–117)
BUN/Creatinine Ratio: 10 (ref 9–23)
BUN: 7 mg/dL (ref 6–24)
Bilirubin Total: 0.3 mg/dL (ref 0.0–1.2)
CO2: 26 mmol/L (ref 20–29)
Calcium: 9.8 mg/dL (ref 8.7–10.2)
Chloride: 99 mmol/L (ref 96–106)
Creatinine, Ser: 0.68 mg/dL (ref 0.57–1.00)
GFR calc Af Amer: 117 mL/min/{1.73_m2} (ref >59)
GFR calc non Af Amer: 102 mL/min/{1.73_m2} (ref >59)
Globulin, Total: 3 g/dL (ref 1.5–4.5)
Glucose: 111 mg/dL — ABNORMAL HIGH (ref 65–99)
Potassium: 3.9 mmol/L (ref 3.5–5.2)
Sodium: 142 mmol/L (ref 134–144)
Total Protein: 7.2 g/dL (ref 6.0–8.5)

## 2019-07-15 LAB — LIPASE: Lipase: 26 U/L (ref 14–72)

## 2019-07-19 ENCOUNTER — Other Ambulatory Visit: Payer: Self-pay | Admitting: Family Medicine

## 2019-07-19 NOTE — Telephone Encounter (Signed)
Requested medication (s) are due for refill today: yes  Requested medication (s) are on the active medication list: no=minipress  yes= zofran  Last refill:  minipress 02/07/19 and pt stated was not taking at appt on 03/12/19 and med was discontinued Zofran: 04/30/19  Future visit scheduled: yes  Notes to clinic:  please review Minipress request and  Zofran is not delegated to NT to refill   Requested Prescriptions  Pending Prescriptions Disp Refills   prazosin (MINIPRESS) 1 MG capsule [Pharmacy Med Name: PRAZOSIN 1 MG CAPSULE] 180 capsule     Sig: TAKE 1 TO 2 CAPSULES (1 TO 2 MG TOTAL) BY MOUTH AT BEDTIME.      Cardiovascular:  Alpha Blockers Failed - 07/19/2019  4:48 PM      Failed - Last BP in normal range    BP Readings from Last 1 Encounters:  07/14/19 (!) 138/98          Passed - Valid encounter within last 6 months    Recent Outpatient Visits           5 days ago Generalized anxiety disorder   [facility], Lilia Argue, MD   2 months ago History of acute pyelonephritis   [facility], Lilia Argue, MD   3 months ago History of acute pyelonephritis   [facility], Lilia Argue, MD   4 months ago Annual physical exam   [facility], Lilia Argue, MD   7 months ago Generalized anxiety disorder   [facility], Lilia Argue, MD       Future Appointments             In 4 weeks Rutherford Guys, MD [facility], [place]              ondansetron (ZOFRAN) 8 MG tablet [Pharmacy Med Name: ONDANSETRON HCL 8 MG TABLET] 9 tablet 2    Sig: Take 1 tablet (8 mg total) by mouth every 8 (eight) hours as needed for nausea or vomiting.      Not Delegated - Gastroenterology: Antiemetics Failed - 07/19/2019  4:48 PM      Failed - This refill cannot be delegated      Passed - Valid encounter within last 6 months    Recent Outpatient Visits           5 days ago Generalized anxiety disorder   [facility], Lilia Argue, MD   2 months ago History of acute pyelonephritis   [facility], Lilia Argue, MD   3 months ago History of acute pyelonephritis   [facility], Lilia Argue, MD   4 months ago Annual physical exam   [facility], Lilia Argue, MD   7 months ago Generalized anxiety disorder   [facility], Lilia Argue, MD       Future Appointments             In 4 weeks Rutherford Guys, MD [facility] at [place], [facility]

## 2019-07-20 NOTE — Telephone Encounter (Signed)
Prescription requests received via interface for prazosin 1mg  and ondansetron 8 mg , please refill if appropriate.

## 2019-07-22 ENCOUNTER — Other Ambulatory Visit: Payer: Self-pay | Admitting: Family Medicine

## 2019-07-22 DIAGNOSIS — F411 Generalized anxiety disorder: Secondary | ICD-10-CM

## 2019-07-22 NOTE — Telephone Encounter (Signed)
Requested medication (s) are due for refill today: yes  Requested medication (s) are on the active medication list: yes  Last refill:  05/28/19  Future visit scheduled: yes  Notes to clinic:  not delegated    Requested Prescriptions  Pending Prescriptions Disp Refills   ALPRAZolam (XANAX) 1 MG tablet [Pharmacy Med Name: ALPRAZOLAM 1 MG TABLET] 30 tablet 1    Sig: TAKE 1/2 OR 1 TABLET BY MOUTH DAILY AS NEEDED FOR ANXIETY OR SLEEP      Not Delegated - Psychiatry:  Anxiolytics/Hypnotics Failed - 07/22/2019 12:53 PM      Failed - This refill cannot be delegated      Passed - Urine Drug Screen completed in last 360 days.      Passed - Valid encounter within last 6 months    Recent Outpatient Visits           1 week ago Generalized anxiety disorder   [facility], Lilia Argue, MD   3 months ago History of acute pyelonephritis   [facility], Lilia Argue, MD   3 months ago History of acute pyelonephritis   [facility], Lilia Argue, MD   4 months ago Annual physical exam   [facility], Lilia Argue, MD   7 months ago Generalized anxiety disorder   [facility], Lilia Argue, MD       Future Appointments             In 3 weeks Rutherford Guys, MD Primary [facility] at [place], [facility]

## 2019-07-22 NOTE — Telephone Encounter (Signed)
Patient is requesting a refill of the following medications: Requested Prescriptions   Pending Prescriptions Disp Refills  . ALPRAZolam (XANAX) 1 MG tablet [Pharmacy Med Name: ALPRAZOLAM 1 MG TABLET] 30 tablet 1    Sig: TAKE 1/2 OR 1 TABLET BY MOUTH DAILY AS NEEDED FOR ANXIETY OR SLEEP    Date of patient request: 07/22/2019 Last office visit: 07/14/2019 Date of last refill: 05/28/2019 Last refill amount: Follow up appointment scheduled

## 2019-07-23 NOTE — Telephone Encounter (Signed)
pmp reviewd, appropriate meds refilled 

## 2019-07-28 NOTE — Addendum Note (Signed)
Addended by: Rutherford Guys on: 07/28/2019 12:48 PM   Modules accepted: Orders

## 2019-07-31 ENCOUNTER — Ambulatory Visit: Payer: 59 | Admitting: Gastroenterology

## 2019-07-31 ENCOUNTER — Encounter: Payer: Self-pay | Admitting: Gastroenterology

## 2019-07-31 VITALS — BP 108/62 | HR 67 | Temp 98.9°F | Ht 63.0 in | Wt 156.0 lb

## 2019-07-31 DIAGNOSIS — K5900 Constipation, unspecified: Secondary | ICD-10-CM

## 2019-07-31 DIAGNOSIS — R1010 Upper abdominal pain, unspecified: Secondary | ICD-10-CM | POA: Diagnosis not present

## 2019-07-31 DIAGNOSIS — K582 Mixed irritable bowel syndrome: Secondary | ICD-10-CM | POA: Diagnosis not present

## 2019-07-31 DIAGNOSIS — R112 Nausea with vomiting, unspecified: Secondary | ICD-10-CM | POA: Diagnosis not present

## 2019-07-31 MED ORDER — ONDANSETRON 4 MG PO TBDP: 4.0000 mg | ORAL_TABLET | Freq: Every day | ORAL | 0 refills | Status: DC | PRN

## 2019-07-31 MED ORDER — PROMETHAZINE HCL 25 MG PO TABS: 25.0000 mg | ORAL_TABLET | Freq: Every day | ORAL | 0 refills | Status: DC

## 2019-07-31 NOTE — Progress Notes (Signed)
Vicki [name]    UQ:[id number]    [birth year]  Primary Care Physician:Santiago, Lilia Argue, MD  Referring Physician: Rutherford Guys, MD [address] [place],  [place] [address]   Chief complaint:  Nausea and Vomiting  HPI:  54 year old female here for new patient visit for evaluation episodic nausea, vomiting associated with generalized abdominal discomfort and also intermittent diarrhea.  Her symptoms have been progressively worse in the past year.  She also has intermittent dark stool and she feels her stool caliber has changed its long, and thin like pencil She feels bumps in her abdomen that push under her ribs on both right and left side. She denies frequent NSAID use.  She smokes 3 to 4 cigarettes/day but denies any use of cannabis or marijuana. No herbal remedies or over-the-counter weight loss medications.  No family history of GI malignancy  Family history positive for breast cancer in her sister   Outpatient Encounter Medications as of 07/31/2019  Medication Sig  . acetaminophen (TYLENOL) 325 MG tablet Take 650 mg by mouth every 6 (six) hours as needed.  Marland Kitchen albuterol (VENTOLIN HFA) 108 (90 Base) MCG/ACT inhaler Inhale 2 puffs into the lungs every 4 (four) hours as needed for wheezing or shortness of breath.  . ALPRAZolam (XANAX) 0.25 MG tablet Take 1 tablet (0.25 mg total) by mouth 3 (three) times daily as needed for anxiety.  . ALPRAZolam (XANAX) 1 MG tablet TAKE 1/2 OR 1 TABLET BY MOUTH DAILY AS NEEDED FOR ANXIETY OR SLEEP  . amLODipine (NORVASC) 5 MG tablet Take 1 tablet (5 mg total) by mouth daily.  . cetirizine (ZYRTEC) 10 MG tablet Take 1 tablet (10 mg total) by mouth daily.  . citalopram (CELEXA) 20 MG tablet Take 3 tablets (60 mg total) by mouth daily.  Marland Kitchen eletriptan (RELPAX) 40 MG tablet TAKE 1 TAB BY MOUTH AT ONSET OF MIGRAINE OR HEADACHE AS NEEDED. MAY REPEAT IN 2 HOURS IF PERSISTS (Patient taking differently: Take 40 mg by mouth See admin  instructions. Take 40mg  at onset of migraine or headache as needed. May repeat in 2 hours if symptoms persist.)  . Fluticasone-Salmeterol (ADVAIR DISKUS) 500-50 MCG/DOSE AEPB INHALE 1 PUFF INTO THE LUNGS TWICE A DAY  . gabapentin (NEURONTIN) 300 MG capsule   . Multiple Vitamin (MULTIVITAMIN WITH MINERALS) TABS tablet Take 1 tablet by mouth daily.  . ondansetron (ZOFRAN) 8 MG tablet TAKE 1 TABLET (8 MG TOTAL) BY MOUTH EVERY 8 (EIGHT) HOURS AS NEEDED FOR NAUSEA OR VOMITING.  . prazosin (MINIPRESS) 1 MG capsule TAKE 1 TO 2 CAPSULES (1 TO 2 MG TOTAL) BY MOUTH AT BEDTIME.  Marland Kitchen rOPINIRole (REQUIP) 1 MG tablet Take 1 tablet (1 mg total) by mouth at bedtime.  . thiamine 100 MG tablet Take 1 tablet (100 mg total) by mouth daily.  . traZODone (DESYREL) 50 MG tablet TAKE 1 TABLET BY MOUTH EVERYDAY AT BEDTIME  . umeclidinium bromide (INCRUSE ELLIPTA) 62.5 MCG/INH AEPB Inhale 1 puff into the lungs daily.  . varenicline (CHANTIX STARTING MONTH PAK) 0.5 MG X 11 & 1 MG X 42 tablet Take one 0.5 mg tablet by mouth once daily for 3 days, then increase to one 0.5 mg tablet twice daily for 4 days, then increase to one 1 mg tablet twice daily.  . [DISCONTINUED] ibuprofen (ADVIL,MOTRIN) 800 MG tablet Take 1 tablet (800 mg total) by mouth every 8 (eight) hours as needed for mild pain.   No  facility-administered encounter medications on file as of 07/31/2019.    Allergies as of 07/31/2019 - Review Complete 07/14/2019  Allergen Reaction Noted  . Jasmine oil Shortness Of Breath and Swelling 02/12/2013  . Levaquin [levofloxacin] Shortness Of Breath 04/09/2019  . Penicillins Anaphylaxis and Hives 02/12/2013  . Aleve [naproxen sodium] Itching 02/12/2013  . Benadryl [diphenhydramine] Other (See Comments) 02/12/2013  . Ceftin [cefuroxime axetil] Other (See Comments) 02/12/2013  . Depo-provera [medroxyprogesterone acetate] Other (See Comments) 02/12/2013  . Doxycycline  01/14/2018  . Oxycodone Nausea And Vomiting 02/12/2013    . Tramadol Other (See Comments) 02/12/2013    Past Medical History:  Diagnosis Date  . Asthma   . Carpal tunnel syndrome   . COPD (chronic obstructive pulmonary disease) ([place])   . Diverticulitis   . Essential hypertension, benign 07/14/2019    Past Surgical History:  Procedure Laterality Date  . ABDOMINAL HYSTERECTOMY  2009  . APPENDECTOMY    . ARTHROSCOPIC REPAIR ACL Left 2011   x 2  . BUNIONECTOMY  2006   both  . CARPAL TUNNEL RELEASE Bilateral 10/28/2013   Procedure: BILATERAL CARPAL TUNNEL RELEASE;  Surgeon: Wynonia Sours, MD;  Location: [facility];  Service: Orthopedics;  Laterality: Bilateral;  . DILATION AND CURETTAGE OF UTERUS    . TUBAL LIGATION  2004    Family History  Problem Relation Age of Onset  . Cancer Mother   . Cancer Sister 6       breast cancer  . Breast cancer Sister   . Breast cancer Sister     Social History   Socioeconomic History  . Marital status: Married    Spouse name: [name]  . Number of children: 3  . Years of education: 12th grade  . Highest education level: Not on file  Occupational History  . Occupation: Airline pilot: [employer]  Tobacco Use  . Smoking status: Current Every Day Smoker    Packs/day: 0.50    Years: 34.00    Pack years: 17.00  . Smokeless tobacco: Never Used  Substance and Sexual Activity  . Alcohol use: Yes    Comment: everyday  . Drug use: No  . Sexual activity: Not on file  Other Topics Concern  . Not on file  Social History Narrative   Lives with her husband.   Their 3 children live independently.   Social Determinants of Health   Financial Resource Strain:   . Difficulty of Paying Living Expenses:   Food Insecurity:   . Worried About Charity fundraiser in the Last Year:   . Arboriculturist in the Last Year:   Transportation Needs:   . Film/video editor (Medical):   Marland Kitchen Lack of Transportation (Non-Medical):   Physical Activity:   . Days of Exercise per Week:    . Minutes of Exercise per Session:   Stress:   . Feeling of Stress :   Social Connections:   . Frequency of Communication with Friends and Family:   . Frequency of Social Gatherings with Friends and Family:   . Attends Religious Services:   . Active Member of Clubs or Organizations:   . Attends Archivist Meetings:   Marland Kitchen Marital Status:   Intimate Partner Violence:   . Fear of Current or Ex-Partner:   . Emotionally Abused:   Marland Kitchen Physically Abused:   . Sexually Abused:       Review of systems:  All other review of  systems negative except as mentioned in the HPI.   Physical Exam: Vitals:   07/31/19 0843  BP: 108/62  Pulse: 67  Temp: 98.9 F (37.2 C)   Body mass index is 27.63 kg/m. Gen:      No acute distress Abd:      + bowel sounds; soft, non-tender; no palpable masses, no distension Ext:    No edema; adequate peripheral perfusion Neuro: alert and oriented x 3 Psych: normal mood and affect  Data Reviewed:  Reviewed labs, radiology imaging, old records and pertinent past GI work up  CT angio chest PE protocol with and without contrast April 08, 2019: Negative for pulmonary embolus, no other significant pathology.  Assessment and Plan/Recommendations:  54 year old female with complaints of episodic nausea, vomiting associated with abdominal pain and intermittent diarrhea  Schedule for EGD and colonoscopy to evaluate for possible esophagitis, gastritis, peptic ulcer disease, exclude gastric outlet obstruction or neoplastic lesion and colorectal cancer Will also need to exclude microscopic colitis or IBD/Crohn's disease. Will obtain biopsies  Nausea and vomiting: Use Zofran ODT 4 mg daily as needed, also provided additional prescription for Phenergan 25 mg daily as needed.  Can alternate Zofran and Phenergan for severe symptoms  Return after EGD and colonoscopy  If endoscopic evaluation negative for any significant pathology, will consider CT abdomen  pelvis with contrast for further evaluation.  The risks and benefits as well as alternatives of endoscopic procedure(s) have been discussed and reviewed. All questions answered. The patient agrees to proceed.   The patient was provided an opportunity to ask questions and all were answered. The patient agreed with the plan and demonstrated an understanding of the instructions.  Damaris Hippo , MD    CC: Rutherford Guys, MD

## 2019-07-31 NOTE — Patient Instructions (Addendum)
If you are age 52 or older, your body mass index should be between 23-30. Your Body mass index is 27.63 kg/m. If this is out of the aforementioned range listed, please consider follow up with your Primary Care Provider.  If you are age 80 or younger, your body mass index should be between 19-25. Your Body mass index is 27.63 kg/m. If this is out of the aformentioned range listed, please consider follow up with your Primary Care Provider.   You have been scheduled for an endoscopy and colonoscopy. Please follow the written instructions given to you at your visit today. Please pick up your prep supplies at the pharmacy within the next 1-3 days. If you use inhalers (even only as needed), please bring them with you on the day of your procedure. Your physician has requested that you go to www.startemmi.com and enter the access code given to you at your visit today. This web site gives a general overview about your procedure. However, you should still follow specific instructions given to you by our office regarding your preparation for the procedure.  We have sent the following medications to your pharmacy for you to pick up at your convenience: Zofran Phenergan  Follow up in 2 - 3 months.  Please call the office for an appointment as the schedule is not available at this time.   I appreciate the opportunity to care for you. Thank you for choosing me and [facility].   Harl Bowie, MD

## 2019-08-16 ENCOUNTER — Other Ambulatory Visit: Payer: Self-pay | Admitting: Family Medicine

## 2019-08-16 DIAGNOSIS — G2581 Restless legs syndrome: Secondary | ICD-10-CM

## 2019-08-16 NOTE — Telephone Encounter (Signed)
Requested Prescriptions  Pending Prescriptions Disp Refills  . rOPINIRole (REQUIP) 1 MG tablet [Pharmacy Med Name: ROPINIROLE HCL 1 MG TABLET] 30 tablet 0    Sig: TAKE 1 TABLET (1 MG TOTAL) BY MOUTH AT BEDTIME.     Neurology:  Parkinsonian Agents Passed - 08/16/2019 12:31 PM      Passed - Last BP in normal range    BP Readings from Last 1 Encounters:  07/31/19 108/62         Passed - Valid encounter within last 12 months    Recent Outpatient Visits          1 month ago Generalized anxiety disorder   [facility], Lilia Argue, MD   3 months ago History of acute pyelonephritis   [facility], Lilia Argue, MD   3 months ago History of acute pyelonephritis   [facility], Lilia Argue, MD   5 months ago Annual physical exam   [facility], Lilia Argue, MD   8 months ago Generalized anxiety disorder   [facility], Lilia Argue, MD      Future Appointments            Tomorrow Rutherford Guys, MD [facility] [place]

## 2019-08-17 ENCOUNTER — Encounter: Payer: Self-pay | Admitting: Family Medicine

## 2019-08-17 ENCOUNTER — Other Ambulatory Visit: Payer: Self-pay

## 2019-08-17 ENCOUNTER — Ambulatory Visit (INDEPENDENT_AMBULATORY_CARE_PROVIDER_SITE_OTHER): Payer: 59

## 2019-08-17 ENCOUNTER — Ambulatory Visit: Payer: 59 | Admitting: Family Medicine

## 2019-08-17 VITALS — BP 131/90 | HR 100 | Temp 98.3°F | Ht 63.0 in | Wt 155.8 lb

## 2019-08-17 DIAGNOSIS — F411 Generalized anxiety disorder: Secondary | ICD-10-CM

## 2019-08-17 DIAGNOSIS — R945 Abnormal results of liver function studies: Secondary | ICD-10-CM

## 2019-08-17 DIAGNOSIS — R0600 Dyspnea, unspecified: Secondary | ICD-10-CM | POA: Diagnosis not present

## 2019-08-17 DIAGNOSIS — I10 Essential (primary) hypertension: Secondary | ICD-10-CM

## 2019-08-17 DIAGNOSIS — R0609 Other forms of dyspnea: Secondary | ICD-10-CM

## 2019-08-17 MED ORDER — GABAPENTIN 300 MG PO CAPS: 300.0000 mg | ORAL_CAPSULE | Freq: Two times a day (BID) | ORAL | 3 refills | Status: DC

## 2019-08-17 NOTE — Patient Instructions (Addendum)
[address], [place], [place] [address] Phone: [phone]  Start prazosin at bedtime Increase gabapentin to BID  If you have lab work done today you will be contacted with your lab results within the next 2 weeks.  If you have not heard from [place] then please contact us. The fastest way to get your results is to register for My Chart.   IF you received an x-ray today, you will receive an invoice from [facility]. Please contact [facility] at [phone] with questions or concerns regarding your invoice.   IF you received labwork today, you will receive an invoice from [facility]. Please contact [facility] at [phone] with questions or concerns regarding your invoice.   Our billing staff will not be able to assist you with questions regarding bills from these companies.  You will be contacted with the lab results as soon as they are available. The fastest way to get your results is to activate your My Chart account. Instructions are located on the last page of this paperwork. If you have not heard from [place] regarding the results in 2 weeks, please contact this office.

## 2019-08-17 NOTE — Progress Notes (Signed)
4/5/202111:42 AM  Angelina Pih May 21, 1965, 54 y.o., female [id number]  Chief Complaint  Patient presents with  . Anxiety    1 month follow up    HPI:   Patient is a 54 y.o. female with past medical history significant for COPD, HTN, HLP, anxiety, diverticulitis, RLS who presents today for routine followup  Last OV march 2020 - added amlodipine, referred to GI  Has seen GI, plan for EGD and colonoscopy pending for recurrent vomiting and diarrhea has had 3 episodes in last month  Takes celexa 60mg  daily and has been using xanax almost daily Doing counseling Takes gabapentin at bedtime for RLS Has not started prazosin yet  GAD 7 : Generalized Anxiety Score 08/17/2019  Nervous, Anxious, on Edge 2  Control/stop worrying 2  Worry too much - different things 2  Trouble relaxing 2  Restless 1  Easily annoyed or irritable 1  Afraid - awful might happen 1  Total GAD 7 Score 11  Anxiety Difficulty Somewhat difficult     Tolerating amlodipine well  Has had first covid vaccine, second one due later this week  Wondering for disability placard, having DOE with occ chest pressure if she needs to park a bit father away at the stores or when doing heavy house chores Has not yet seen pulm, wanted to deal with GI issues first Takes advair, ellipta daily and albuterol as needed Continues to smoke, has not started chantix  Referred to pulm last OV, has not heard from them yet Has never seen cards  Depression screen [facility] 2/9 08/17/2019 07/14/2019 06/18/2019  Decreased Interest 0 0 1  Down, Depressed, Hopeless 0 0 1  PHQ - 2 Score 0 0 2  Altered sleeping - - 3  Tired, decreased energy - - 1  Change in appetite - - 0  Feeling bad or failure about yourself  - - 0  Trouble concentrating - - 1  Moving slowly or fidgety/restless - - 1  Suicidal thoughts - - 0  PHQ-9 Score - - 8  Difficult doing work/chores - - Not difficult at all    Fall Risk  08/17/2019 07/14/2019 06/18/2019  04/20/2019 03/12/2019  Falls in the past year? 0 1 0 0 0  Number falls in past yr: 0 0 0 0 0  Injury with Fall? 0 1 0 0 0  Follow up - Falls evaluation completed - - -     Allergies  Allergen Reactions  . Jasmine Oil Shortness Of Breath and Swelling  . Levaquin [Levofloxacin] Shortness Of Breath    Rash, swelling, itching  . Penicillins Anaphylaxis and Hives    Tolerated ceftriaxone  . Aleve [Naproxen Sodium] Itching  . Benadryl [Diphenhydramine] Other (See Comments)    hallucinations  . Ceftin [Cefuroxime Axetil] Other (See Comments)    unsure  . Depo-Provera [Medroxyprogesterone Acetate] Other (See Comments)    Severe acne  . Doxycycline     Raises blood pressure   . Oxycodone Nausea And Vomiting  . Tramadol Other (See Comments)    Kept her up all night    Prior to Admission medications   Medication Sig Start Date End Date Taking? Authorizing Provider  acetaminophen (TYLENOL) 325 MG tablet Take 650 mg by mouth every 6 (six) hours as needed.   Yes [provider]  albuterol (VENTOLIN HFA) 108 (90 Base) MCG/ACT inhaler Inhale 2 puffs into the lungs every 4 (four) hours as needed for wheezing or shortness of breath. 03/12/19  Yes Pamella Pert,  Lilia Argue, MD  ALPRAZolam Duanne Moron) 0.25 MG tablet Take 1 tablet (0.25 mg total) by mouth 3 (three) times daily as needed for anxiety. 04/11/19  Yes Swayze, Ava, DO  ALPRAZolam (XANAX) 1 MG tablet TAKE 1/2 OR 1 TABLET BY MOUTH DAILY AS NEEDED FOR ANXIETY OR SLEEP 07/23/19  Yes Rutherford Guys, MD  amLODipine (NORVASC) 5 MG tablet Take 1 tablet (5 mg total) by mouth daily. 07/14/19  Yes Rutherford Guys, MD  cetirizine (ZYRTEC) 10 MG tablet Take 1 tablet (10 mg total) by mouth daily. 07/19/17  Yes McVey, Gelene Mink, PA-C  citalopram (CELEXA) 20 MG tablet Take 3 tablets (60 mg total) by mouth daily. 07/14/19  Yes Rutherford Guys, MD  eletriptan (RELPAX) 40 MG tablet TAKE 1 TAB BY MOUTH AT ONSET OF MIGRAINE OR HEADACHE AS NEEDED. MAY REPEAT IN  2 HOURS IF PERSISTS Patient taking differently: Take 40 mg by mouth See admin instructions. Take 40mg  at onset of migraine or headache as needed. May repeat in 2 hours if symptoms persist. 03/12/19  Yes Rutherford Guys, MD  Fluticasone-Salmeterol (ADVAIR DISKUS) 500-50 MCG/DOSE AEPB INHALE 1 PUFF INTO THE LUNGS TWICE A DAY 07/14/19  Yes Rutherford Guys, MD  gabapentin (NEURONTIN) 300 MG capsule  06/30/19  Yes [provider]  Multiple Vitamin (MULTIVITAMIN WITH MINERALS) TABS tablet Take 1 tablet by mouth daily. 04/12/19  Yes Swayze, Ava, DO  ondansetron (ZOFRAN ODT) 4 MG disintegrating tablet Take 1 tablet (4 mg total) by mouth daily as needed for nausea or vomiting. 07/31/19  Yes Willia Craze, NP  ondansetron (ZOFRAN) 8 MG tablet TAKE 1 TABLET (8 MG TOTAL) BY MOUTH EVERY 8 (EIGHT) HOURS AS NEEDED FOR NAUSEA OR VOMITING. 07/20/19  Yes Rutherford Guys, MD  prazosin (MINIPRESS) 1 MG capsule TAKE 1 TO 2 CAPSULES (1 TO 2 MG TOTAL) BY MOUTH AT BEDTIME. 07/20/19  Yes Rutherford Guys, MD  promethazine (PHENERGAN) 25 MG tablet Take 1 tablet (25 mg total) by mouth daily. 07/31/19  Yes Willia Craze, NP  rOPINIRole (REQUIP) 1 MG tablet TAKE 1 TABLET (1 MG TOTAL) BY MOUTH AT BEDTIME. 08/16/19  Yes Rutherford Guys, MD  thiamine 100 MG tablet Take 1 tablet (100 mg total) by mouth daily. 04/12/19  Yes Swayze, Ava, DO  traZODone (DESYREL) 50 MG tablet TAKE 1 TABLET BY MOUTH EVERYDAY AT BEDTIME 06/02/19  Yes Rutherford Guys, MD  umeclidinium bromide (INCRUSE ELLIPTA) 62.5 MCG/INH AEPB Inhale 1 puff into the lungs daily. 09/17/18  Yes Rutherford Guys, MD  varenicline (CHANTIX STARTING MONTH PAK) 0.5 MG X 11 & 1 MG X 42 tablet Take one 0.5 mg tablet by mouth once daily for 3 days, then increase to one 0.5 mg tablet twice daily for 4 days, then increase to one 1 mg tablet twice daily. 07/14/19  Yes Rutherford Guys, MD  glycopyrrolate (ROBINUL) 1 MG tablet Take 1 mg by mouth at bedtime. 07/14/19   [provider]  ibuprofen (ADVIL,MOTRIN) 800 MG tablet Take 1 tablet (800 mg total) by mouth every 8 (eight) hours as needed for mild pain. 04/22/18   Rutherford Guys, MD    Past Medical History:  Diagnosis Date  . Asthma   . Carpal tunnel syndrome   . COPD (chronic obstructive pulmonary disease) ([place])   . Diverticulitis   . Essential hypertension, benign 07/14/2019    Past Surgical History:  Procedure Laterality Date  . ABDOMINAL HYSTERECTOMY  2009  .  APPENDECTOMY    . ARTHROSCOPIC REPAIR ACL Left 2011   x 2  . BUNIONECTOMY  2006   both  . CARPAL TUNNEL RELEASE Bilateral 10/28/2013   Procedure: BILATERAL CARPAL TUNNEL RELEASE;  Surgeon: Wynonia Sours, MD;  Location: [facility];  Service: Orthopedics;  Laterality: Bilateral;  . DILATION AND CURETTAGE OF UTERUS    . TUBAL LIGATION  2004    Social History   Tobacco Use  . Smoking status: Current Every Day Smoker    Packs/day: 0.50    Years: 34.00    Pack years: 17.00  . Smokeless tobacco: Never Used  Substance Use Topics  . Alcohol use: Yes    Comment: everyday    Family History  Problem Relation Age of Onset  . Cancer Mother   . Cancer Sister 3       breast cancer  . Breast cancer Sister   . Breast cancer Sister     ROS Per hpi  OBJECTIVE:  Today's Vitals   08/17/19 1132  BP: 131/90  Pulse: 100  Temp: 98.3 F (36.8 C)  SpO2: 93%  Weight: 155 lb 12.8 oz (70.7 kg)  Height: 5\' 3"  (1.6 m)   Body mass index is 27.6 kg/m.   Physical Exam Vitals and nursing note reviewed.  Constitutional:      Appearance: She is well-developed.  HENT:     Head: Normocephalic and atraumatic.     Mouth/Throat:     Pharynx: No oropharyngeal exudate.  Eyes:     General: No scleral icterus.    Conjunctiva/sclera: Conjunctivae normal.     Pupils: Pupils are equal, round, and reactive to light.  Cardiovascular:     Rate and Rhythm: Normal rate and regular rhythm.     Heart sounds: Normal heart sounds. No  murmur. No friction rub. No gallop.   Pulmonary:     Effort: Pulmonary effort is normal.     Breath sounds: Normal breath sounds. No wheezing or rales.  Musculoskeletal:     Cervical back: Neck supple.     Right lower leg: No edema.     Left lower leg: No edema.  Skin:    General: Skin is warm and dry.  Neurological:     Mental Status: She is alert and oriented to person, place, and time.    6 minute walk on RA: O2 sat 87%, HR 138  Ekg: HR 112, atrial flutter  No results found for this or any previous visit (from the past 24 hour(s)).  DG Chest 2 View  Result Date: 08/17/2019 CLINICAL DATA:  Dyspnea on exertion. History of smoking. EXAM: CHEST - 2 VIEW COMPARISON:  Chest radiograph 04/07/2019 and CTA 04/08/2019 FINDINGS: The cardiomediastinal silhouette is unchanged with normal heart size. The lungs remain hyperinflated. No airspace consolidation, edema, pleural effusion, pneumothorax is identified. No acute osseous abnormality is seen. IMPRESSION: COPD without evidence of active cardiopulmonary disease. Electronically Signed   By: Logan Bores M.D.   On: 08/17/2019 14:01     ASSESSMENT and PLAN  1. DOE (dyspnea on exertion) Clinically stable. Seems to be multifactorial pulm and cards. Discussed starting ASA 81mg . ER precautions reviewed. Referred to cards urgently. Advised to call pulm to schedule appt. Labs pending. - DG Chest 2 View - EKG 12-Lead - Comprehensive metabolic panel - TSH - CBC - Ambulatory referral to Cardiology  2. Generalized anxiety disorder Not well controlled. Discussed starting prazosin for nightmares as discussed last OV. Consider increasing gabapentin BID  for anxiety. Intolerant of buspar. Advised very prn use of xanax  3. Essential hypertension, benign Controlled. Continue current regime.   Other orders - gabapentin (NEURONTIN) 300 MG capsule; Take 1 capsule (300 mg total) by mouth 2 (two) times daily.  Return in about 6 weeks (around  09/28/2019).    Rutherford Guys, MD [facility] at [facility] [place], [place] [address] Ph.  [phone] Fax [phone]

## 2019-08-18 LAB — CBC
Hematocrit: 41.1 % (ref 34.0–46.6)
Hemoglobin: 14.6 g/dL (ref 11.1–15.9)
MCH: 32.9 pg (ref 26.6–33.0)
MCHC: 35.5 g/dL (ref 31.5–35.7)
MCV: 93 fL (ref 79–97)
Platelets: 149 10*3/uL — ABNORMAL LOW (ref 150–450)
RBC: 4.44 x10E6/uL (ref 3.77–5.28)
RDW: 12.1 % (ref 11.7–15.4)
WBC: 7.8 10*3/uL (ref 3.4–10.8)

## 2019-08-18 LAB — COMPREHENSIVE METABOLIC PANEL
ALT: 44 IU/L — ABNORMAL HIGH (ref 0–32)
AST: 85 IU/L — ABNORMAL HIGH (ref 0–40)
Albumin/Globulin Ratio: 1.7 (ref 1.2–2.2)
Albumin: 4.6 g/dL (ref 3.8–4.9)
Alkaline Phosphatase: 161 IU/L — ABNORMAL HIGH (ref 39–117)
BUN/Creatinine Ratio: 12 (ref 9–23)
BUN: 11 mg/dL (ref 6–24)
Bilirubin Total: 1 mg/dL (ref 0.0–1.2)
CO2: 21 mmol/L (ref 20–29)
Calcium: 8.9 mg/dL (ref 8.7–10.2)
Chloride: 96 mmol/L (ref 96–106)
Creatinine, Ser: 0.94 mg/dL (ref 0.57–1.00)
GFR calc Af Amer: 81 mL/min/{1.73_m2} (ref >59)
GFR calc non Af Amer: 70 mL/min/{1.73_m2} (ref >59)
Globulin, Total: 2.7 g/dL (ref 1.5–4.5)
Glucose: 105 mg/dL — ABNORMAL HIGH (ref 65–99)
Potassium: 4 mmol/L (ref 3.5–5.2)
Sodium: 137 mmol/L (ref 134–144)
Total Protein: 7.3 g/dL (ref 6.0–8.5)

## 2019-08-18 LAB — TSH: TSH: 3.24 u[IU]/mL (ref 0.450–4.500)

## 2019-08-20 ENCOUNTER — Ambulatory Visit: Payer: 59 | Admitting: Internal Medicine

## 2019-08-24 ENCOUNTER — Ambulatory Visit (INDEPENDENT_AMBULATORY_CARE_PROVIDER_SITE_OTHER): Payer: 59

## 2019-08-24 ENCOUNTER — Other Ambulatory Visit: Payer: Self-pay | Admitting: Gastroenterology

## 2019-08-24 ENCOUNTER — Other Ambulatory Visit: Payer: Self-pay

## 2019-08-24 DIAGNOSIS — Z1159 Encounter for screening for other viral diseases: Secondary | ICD-10-CM

## 2019-08-24 LAB — SARS CORONAVIRUS 2 (TAT 6-24 HRS): SARS Coronavirus 2: NEGATIVE

## 2019-08-26 ENCOUNTER — Telehealth: Payer: Self-pay

## 2019-08-26 ENCOUNTER — Encounter: Payer: Self-pay | Admitting: Gastroenterology

## 2019-08-26 ENCOUNTER — Other Ambulatory Visit: Payer: Self-pay

## 2019-08-26 ENCOUNTER — Ambulatory Visit (AMBULATORY_SURGERY_CENTER): Payer: Self-pay | Admitting: Gastroenterology

## 2019-08-26 VITALS — BP 123/65 | HR 109 | Temp 97.1°F | Ht 63.0 in | Wt 156.0 lb

## 2019-08-26 DIAGNOSIS — R112 Nausea with vomiting, unspecified: Secondary | ICD-10-CM

## 2019-08-26 MED ORDER — PROMETHAZINE HCL 25 MG PO TABS: ORAL_TABLET | ORAL | 3 refills | Status: DC

## 2019-08-26 NOTE — Telephone Encounter (Signed)
-----   Message from Mauri Pole, MD sent at 08/26/2019  3:24 PM EDT ----- Can you please bring her in for office visit with me or app in May, will need EGD and colonoscopy to be scheduled at [facility] endoscopy unit once cleared by pulmonary and cardiology.  Thank you

## 2019-08-26 NOTE — Progress Notes (Signed)
During admitting process noted oxygen level to be 87%,placed pulse ox on finger while reviewing history for admission,pt. Oxygen level went up to 92 % but then dropped down to 87 %-89% heart rate 109,made Dr. Silverio Decamp aware and CRNA.[id number]. William wall CRNA into evaluate pt.[id number] Dr. Silverio Decamp in to speak with pt. To explain  D/T  oxygen level  remaining low she needs to be done at the hospital but she needs to go and see cardiologist and pulmonary dr. Pt. Verbalize understanding. Verbal order received for phenergan 25 mg every 8 hours as needed for nausea and vomiting with 3 refills. Pt. Aware to pick up rx. Procedure cancelled.

## 2019-08-26 NOTE — Progress Notes (Signed)
Resting oxygen saturation ranging 85 to 90%.  Patient does complain of significant dyspnea with exertion. She has appointment scheduled with pulmonary and also cardiology later this month.  We will cancel the procedures given low O2 saturation, is at risk for worsening hypoxemia, cardiopulmonary related complications, intubation and mechanical ventilation.   Schedule follow-up office visit in 3 to 4 weeks and will plan to do the procedure once we obtain clearance from cardiology and pulmonary, to be scheduled at [facility] endoscopy unit.   Vitals:   08/26/19 1428  BP: 123/65  Pulse: (!) 109  Temp: (!) 97.1 F (36.2 C)  SpO2: (!) 87%    The patient was provided an opportunity to ask questions and all were answered. The patient agreed with the plan and demonstrated an understanding of the instructions.  Damaris Hippo , MD    CC: Rutherford Guys, MD

## 2019-08-27 NOTE — Telephone Encounter (Signed)
Pt scheduled to see Dr. Silverio Decamp 09/28/19@1 :30pm. Appt letter mailed to pt.

## 2019-09-03 ENCOUNTER — Ambulatory Visit (INDEPENDENT_AMBULATORY_CARE_PROVIDER_SITE_OTHER): Payer: 59 | Admitting: Internal Medicine

## 2019-09-03 ENCOUNTER — Other Ambulatory Visit: Payer: Self-pay

## 2019-09-03 ENCOUNTER — Encounter: Payer: Self-pay | Admitting: Internal Medicine

## 2019-09-03 VITALS — BP 136/86 | HR 90 | Ht 63.0 in | Wt 161.6 lb

## 2019-09-03 DIAGNOSIS — M79604 Pain in right leg: Secondary | ICD-10-CM

## 2019-09-03 DIAGNOSIS — R0602 Shortness of breath: Secondary | ICD-10-CM | POA: Diagnosis not present

## 2019-09-03 DIAGNOSIS — Z8249 Family history of ischemic heart disease and other diseases of the circulatory system: Secondary | ICD-10-CM

## 2019-09-03 DIAGNOSIS — R079 Chest pain, unspecified: Secondary | ICD-10-CM

## 2019-09-03 DIAGNOSIS — J449 Chronic obstructive pulmonary disease, unspecified: Secondary | ICD-10-CM

## 2019-09-03 DIAGNOSIS — M79605 Pain in left leg: Secondary | ICD-10-CM

## 2019-09-03 DIAGNOSIS — F17200 Nicotine dependence, unspecified, uncomplicated: Secondary | ICD-10-CM

## 2019-09-03 DIAGNOSIS — R209 Unspecified disturbances of skin sensation: Secondary | ICD-10-CM

## 2019-09-03 NOTE — Progress Notes (Signed)
OFFICE CONSULT NOTE  Chief Complaint:  Dyspnea on exertion, chest discomfort  Primary Care Physician: Vicki Guys, MD  HPI:  Vicki [name] is a 54 y.o. female who is being seen today for the evaluation of dyspnea on exertion at the request of Vicki [name], Vicki M, MD.  This is a pleasant 54 year old female kindly referred for evaluation management of dyspnea on exertion and some chest discomfort.  Past medical history significant for COPD and she recently has been referred to pulmonary as well.  She is struggled with hypoxia recently as well.  She reports dyspnea on exertion with minimal activity, in fact walking the end of her driveway caused her to be short of breath.  She also complains of leg pain with this.  She denies burning but has had cramping and heaviness of her legs.  Heart diseases in her family including her mom who had a massive heart attack age 59 and father who had some heart disease in his 23s but died believe of emphysema.  She is a smoker.  She also has hypertension and he is not currently on medical therapy for dyslipidemia.  EKG today shows sinus rhythm with poor R wave progression anteriorly at 90.   PMHx:  Past Medical History:  Diagnosis Date  . Allergy   . Anxiety   . Arthritis    hands  . Asthma   . Carpal tunnel syndrome   . Cataract    start bilateral  . COPD (chronic obstructive pulmonary disease) ([place])   . Depression   . Diverticulitis   . Essential hypertension, benign 07/14/2019  . Hyperlipidemia     Past Surgical History:  Procedure Laterality Date  . ABDOMINAL HYSTERECTOMY  2009  . APPENDECTOMY    . ARTHROSCOPIC REPAIR ACL Left 2011   x 2  . BUNIONECTOMY  2006   both  . CARPAL TUNNEL RELEASE Bilateral 10/28/2013   Procedure: BILATERAL CARPAL TUNNEL RELEASE;  Surgeon: Wynonia Sours, MD;  Location: [facility];  Service: Orthopedics;  Laterality: Bilateral;  . DILATION AND CURETTAGE OF UTERUS    . TUBAL LIGATION  2004     FAMHx:  Family History  Problem Relation Age of Onset  . Cancer Mother   . Cancer Sister 74       breast cancer  . Breast cancer Sister   . Breast cancer Sister   . Ovarian cancer Sister   . Breast cancer Sister   . Colon cancer Neg Hx   . Colon polyps Neg Hx   . Esophageal cancer Neg Hx   . Rectal cancer Neg Hx   . Stomach cancer Neg Hx     SOCHx:   reports that she has been smoking. She has a 17.00 pack-year smoking history. She has never used smokeless tobacco. She reports current alcohol use. She reports that she does not use drugs.  ALLERGIES:  Allergies  Allergen Reactions  . Jasmine Oil Shortness Of Breath and Swelling  . Levaquin [Levofloxacin] Shortness Of Breath    Rash, swelling, itching  . Penicillins Anaphylaxis and Hives    Tolerated ceftriaxone  . Aleve [Naproxen Sodium] Itching  . Benadryl [Diphenhydramine] Other (See Comments)    hallucinations  . Ceftin [Cefuroxime Axetil] Other (See Comments)    unsure  . Depo-Provera [Medroxyprogesterone Acetate] Other (See Comments)    Severe acne  . Doxycycline     Raises blood pressure   . Oxycodone Nausea And Vomiting  . Tramadol Other (See  Comments)    Kept her up all night    ROS: Pertinent items noted in HPI and remainder of comprehensive ROS otherwise negative.  HOME MEDS: Current Outpatient Medications on File Prior to Visit  Medication Sig Dispense Refill  . acetaminophen (TYLENOL) 325 MG tablet Take 650 mg by mouth every 6 (six) hours as needed.    Marland Kitchen albuterol (VENTOLIN HFA) 108 (90 Base) MCG/ACT inhaler Inhale 2 puffs into the lungs every 4 (four) hours as needed for wheezing or shortness of breath. 18 g 3  . ALPRAZolam (XANAX) 0.25 MG tablet Take 1 tablet (0.25 mg total) by mouth 3 (three) times daily as needed for anxiety. 30 tablet 0  . ALPRAZolam (XANAX) 1 MG tablet TAKE 1/2 OR 1 TABLET BY MOUTH DAILY AS NEEDED FOR ANXIETY OR SLEEP 30 tablet 1  . amLODipine (NORVASC) 5 MG tablet Take 1  tablet (5 mg total) by mouth daily. 90 tablet 3  . aspirin EC 81 MG tablet Take 81 mg by mouth daily.    . cetirizine (ZYRTEC) 10 MG tablet Take 1 tablet (10 mg total) by mouth daily. 30 tablet 11  . citalopram (CELEXA) 20 MG tablet Take 3 tablets (60 mg total) by mouth daily. 270 tablet 2  . eletriptan (RELPAX) 40 MG tablet TAKE 1 TAB BY MOUTH AT ONSET OF MIGRAINE OR HEADACHE AS NEEDED. MAY REPEAT IN 2 HOURS IF PERSISTS (Patient taking differently: Take 40 mg by mouth See admin instructions. Take 40mg  at onset of migraine or headache as needed. May repeat in 2 hours if symptoms persist.) 10 tablet 5  . Fluticasone-Salmeterol (ADVAIR DISKUS) 500-50 MCG/DOSE AEPB INHALE 1 PUFF INTO THE LUNGS TWICE A DAY 60 each 1  . gabapentin (NEURONTIN) 300 MG capsule Take 1 capsule (300 mg total) by mouth 2 (two) times daily. 60 capsule 3  . glycopyrrolate (ROBINUL) 1 MG tablet Take 1 mg by mouth at bedtime.    . Multiple Vitamin (MULTIVITAMIN WITH MINERALS) TABS tablet Take 1 tablet by mouth daily. 30 tablet 0  . ondansetron (ZOFRAN ODT) 4 MG disintegrating tablet Take 1 tablet (4 mg total) by mouth daily as needed for nausea or vomiting. 30 tablet 0  . ondansetron (ZOFRAN) 8 MG tablet TAKE 1 TABLET (8 MG TOTAL) BY MOUTH EVERY 8 (EIGHT) HOURS AS NEEDED FOR NAUSEA OR VOMITING. 9 tablet 2  . prazosin (MINIPRESS) 1 MG capsule TAKE 1 TO 2 CAPSULES (1 TO 2 MG TOTAL) BY MOUTH AT BEDTIME. 180 capsule 1  . promethazine (PHENERGAN) 25 MG tablet Take by mouth every 8 hours as needed. 30 tablet 3  . rOPINIRole (REQUIP) 1 MG tablet TAKE 1 TABLET (1 MG TOTAL) BY MOUTH AT BEDTIME. 30 tablet 0  . thiamine 100 MG tablet Take 1 tablet (100 mg total) by mouth daily. 30 tablet 0  . traZODone (DESYREL) 50 MG tablet TAKE 1 TABLET BY MOUTH EVERYDAY AT BEDTIME 90 tablet 1  . umeclidinium bromide (INCRUSE ELLIPTA) 62.5 MCG/INH AEPB Inhale 1 puff into the lungs daily. 30 each 6  . varenicline (CHANTIX STARTING MONTH PAK) 0.5 MG X 11 & 1  MG X 42 tablet Take one 0.5 mg tablet by mouth once daily for 3 days, then increase to one 0.5 mg tablet twice daily for 4 days, then increase to one 1 mg tablet twice daily. 53 tablet 0  . [DISCONTINUED] ibuprofen (ADVIL,MOTRIN) 800 MG tablet Take 1 tablet (800 mg total) by mouth every 8 (eight) hours as needed for mild  pain. 30 tablet 0   No current facility-administered medications on file prior to visit.    LABS/IMAGING: No results found for this or any previous visit (from the past 48 hour(s)). No results found.  LIPID PANEL:    Component Value Date/Time   CHOL 207 (H) 07/14/2019 1458   TRIG 274 (H) 07/14/2019 1458   HDL 66 07/14/2019 1458   CHOLHDL 3.1 07/14/2019 1458   LDLCALC 96 07/14/2019 1458    WEIGHTS: Wt Readings from Last 3 Encounters:  09/03/19 161 lb 9.6 oz (73.3 kg)  08/26/19 156 lb (70.8 kg)  08/17/19 155 lb 12.8 oz (70.7 kg)    VITALS: BP 136/86   Pulse 90   Ht 5\' 3"  (1.6 [name])   Wt 161 lb 9.6 oz (73.3 kg)   BMI 28.63 kg/[name]   EXAM: General appearance: alert and no distress Neck: no carotid bruit, no JVD and thyroid not enlarged, symmetric, no tenderness/mass/nodules Lungs: diminished breath sounds bilaterally Heart: regular rate and rhythm, S1, S2 normal, no murmur, click, rub or gallop Abdomen: soft, non-tender; bowel sounds normal; no masses,  no organomegaly Extremities: extremities normal, atraumatic, no cyanosis or edema Pulses: Diminished bilaterally lower extremity Skin: Skin color, texture, turgor normal. No rashes or lesions Neurologic: Mental status: Alert, oriented, thought content appropriate Psych: Pleasant  EKG: Normal sinus rhythm at 90, poor R wave progression-personally reviewed- personally reviewed  ASSESSMENT: 1. Progressive dyspnea on exertion/chest discomfort 2. COPD with recent hypoxia 3. Hypertension 4. Strong family history of premature coronary disease 5. Bilateral lower extremity leg heaviness/weakness-?   Claudication  PLAN: 1.   This is a pleasant 54 year old female with progressive symptoms of dyspnea on exertion and chest discomfort while walking only short distances.  She has this complicated by some hypoxemia and she has known COPD.  This could be worsening COPD or underlying coronary artery disease.  There is a very strong family history of early onset heart disease including her mother who had an MI which was significant in her 11s.  I would like to get a Lexiscan Myoview stress test and advise starting low-dose aspirin.  Most likely she will need to go on statin therapy as well as her cholesterol target should be lower.  We will plan follow-up after the study and she may need a further work-up including possible cardiac catheterization.  Finally with regards to her leg heaviness, this could be related to hypoxemia or other causes of weakness including poor circulation.  Pulses were difficult to palpate and I would recommend lower extremity arterial Dopplers given her smoking history to rule out significant PAD.  Follow-up with me afterwards.  Thanks again for the kind referral.  Pixie Casino, MD, FACC, [place] Director of the [facility]  [facility] Diplomate of the American Board of Clinical Lipidology Attending Cardiologist  Direct Dial: [phone]  Fax: [phone]  Website:  [email or web address]  Vicki [name] 09/03/2019, 1:20 PM

## 2019-09-03 NOTE — Patient Instructions (Signed)
Medication Instructions:  Dr. Debara Pickett recommends start BP medications recommended by PCP and also start aspirin 81mg  daily  *If you need a refill on your cardiac medications before your next appointment, please call your pharmacy*  Testing/Procedures: Dr. Debara Pickett has ordered a LEXISCAN MYOVIEW (stress test) and LOWER EXTREMITY ARTERIAL DOPPLERS -- stress test @ [id number] -- leg dopplers @ Dr. Lysbeth Penner office    Follow-Up: At [facility], you and your health needs are our priority.  As part of our continuing mission to provide you with exceptional heart care, we have created designated Provider Care Teams.  These Care Teams include your primary Cardiologist (physician) and Advanced Practice Providers (APPs -  Physician Assistants and Nurse Practitioners) who all work together to provide you with the care you need, when you need it.  We recommend signing up for the patient portal called "MyChart".  Sign up information is provided on this After Visit Summary.  MyChart is used to connect with patients for Virtual Visits (Telemedicine).  Patients are able to view lab/test results, encounter notes, upcoming appointments, etc.  Non-urgent messages can be sent to your provider as well.   To learn more about what you can do with MyChart, go to [email or web address].    Your next appointment:   4-6 week(s) - after testing  The format for your next appointment:   In Person  Provider:   You may see Dr. Lyman Bishop or one of the following Advanced Practice Providers on your designated Care Team:    Almyra Deforest, PA-C  Fabian Sharp, [place] or   Roby Lofts, [place]    Other Instructions

## 2019-09-04 ENCOUNTER — Other Ambulatory Visit: Payer: Self-pay | Admitting: Internal Medicine

## 2019-09-04 ENCOUNTER — Other Ambulatory Visit (HOSPITAL_COMMUNITY): Payer: Self-pay | Admitting: Internal Medicine

## 2019-09-04 DIAGNOSIS — R209 Unspecified disturbances of skin sensation: Secondary | ICD-10-CM

## 2019-09-04 DIAGNOSIS — M79604 Pain in right leg: Secondary | ICD-10-CM

## 2019-09-04 DIAGNOSIS — M79605 Pain in left leg: Secondary | ICD-10-CM

## 2019-09-06 NOTE — Addendum Note (Signed)
Addended by: Rutherford Guys on: 09/06/2019 07:12 PM   Modules accepted: Orders

## 2019-09-07 ENCOUNTER — Telehealth (HOSPITAL_COMMUNITY): Payer: Self-pay | Admitting: *Deleted

## 2019-09-07 ENCOUNTER — Ambulatory Visit (HOSPITAL_COMMUNITY)
Admission: RE | Admit: 2019-09-07 | Payer: 59 | Source: Ambulatory Visit | Attending: Internal Medicine | Admitting: Internal Medicine

## 2019-09-07 NOTE — Telephone Encounter (Signed)
Left message on voicemail per DPR in reference to upcoming appointment scheduled on 09/09/19 with detailed instructions given per Myocardial Perfusion Study Information Sheet for the test. LM to arrive 15 minutes early, and that it is imperative to arrive on time for appointment to keep from having the test rescheduled. If you need to cancel or reschedule your appointment, please call the office within 24 hours of your appointment. Failure to do so may result in a cancellation of your appointment, and a $50 no show fee. Phone number given for call back for any questions. Kirstie Peri

## 2019-09-08 ENCOUNTER — Other Ambulatory Visit: Payer: Self-pay

## 2019-09-08 ENCOUNTER — Encounter: Payer: Self-pay | Admitting: Pulmonary Disease

## 2019-09-08 ENCOUNTER — Ambulatory Visit: Payer: 59 | Admitting: Pulmonary Disease

## 2019-09-08 ENCOUNTER — Other Ambulatory Visit (HOSPITAL_COMMUNITY): Payer: 59

## 2019-09-08 VITALS — BP 118/78 | Temp 97.2°F | Ht 63.0 in | Wt 159.8 lb

## 2019-09-08 DIAGNOSIS — R0602 Shortness of breath: Secondary | ICD-10-CM

## 2019-09-08 NOTE — Patient Instructions (Signed)
Chronic obstructive pulmonary disease Shortness of breath with exertion Hypoxemia  Ambulatory oximetry Obtain PFT Obtain echocardiogram  Encouraged graded exercises  Need to quit smoking-already on Chantix  Call with significant concerns

## 2019-09-08 NOTE — Progress Notes (Signed)
Vicki [name]    [id number]    07-04-1965  Primary Care Physician:Santiago, Lilia Argue, MD  Referring Physician: Rutherford Guys, MD [address] 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 acetaminophen (TYLENOL) 325 MG tablet Take 650 mg by mouth every 6 (six) hours as needed.  Marland Kitchen albuterol (VENTOLIN HFA) 108 (90 Base) MCG/ACT inhaler Inhale 2 puffs into the lungs every 4 (four) hours as needed for wheezing or shortness of breath.  . ALPRAZolam (XANAX) 0.25 MG tablet Take 1 tablet (0.25 mg total) by mouth 3 (three) times daily as needed for anxiety.  . ALPRAZolam (XANAX) 1 MG tablet TAKE 1/2 OR 1 TABLET BY MOUTH DAILY AS NEEDED FOR ANXIETY OR SLEEP  . amLODipine (NORVASC) 5 MG tablet Take 1 tablet (5 mg total) by mouth daily.  Marland Kitchen aspirin EC 81 MG tablet Take 81 mg by mouth daily.  . cetirizine (ZYRTEC) 10 MG tablet Take 1 tablet (10 mg total) by mouth daily.  . citalopram (CELEXA) 20 MG tablet Take 3 tablets (60 mg total) by mouth daily.  Marland Kitchen eletriptan (RELPAX) 40 MG tablet TAKE 1 TAB BY MOUTH AT ONSET OF MIGRAINE OR HEADACHE AS  NEEDED. MAY REPEAT IN 2 HOURS IF PERSISTS (Patient taking differently: Take 40 mg by mouth See admin instructions. Take 40mg  at onset of migraine or headache as needed. May repeat in 2 hours if symptoms persist.)  . Fluticasone-Salmeterol (ADVAIR DISKUS) 500-50 MCG/DOSE AEPB INHALE 1 PUFF INTO THE LUNGS TWICE A DAY  . gabapentin (NEURONTIN) 300 MG capsule Take 1 capsule (300 mg total) by mouth 2 (two) times daily.  Marland Kitchen glycopyrrolate (ROBINUL) 1 MG tablet Take 1 mg by mouth at bedtime.  . Multiple Vitamin (MULTIVITAMIN WITH MINERALS) TABS tablet Take 1 tablet by mouth daily.  . ondansetron (ZOFRAN ODT) 4 MG disintegrating tablet Take 1 tablet (4 mg total) by mouth daily as needed for nausea or vomiting.  . ondansetron (ZOFRAN) 8 MG tablet TAKE 1 TABLET (8 MG TOTAL) BY MOUTH EVERY 8 (EIGHT) HOURS AS NEEDED FOR NAUSEA OR VOMITING.  . prazosin (MINIPRESS) 1 MG capsule TAKE 1 TO 2 CAPSULES (1 TO 2 MG TOTAL) BY MOUTH AT BEDTIME.  . promethazine (PHENERGAN) 25 MG tablet Take by mouth every 8 hours as needed.  Marland Kitchen rOPINIRole (REQUIP) 1 MG tablet TAKE 1 TABLET (1 MG TOTAL) BY MOUTH AT BEDTIME.  Marland Kitchen thiamine 100 MG tablet Take 1 tablet (100 mg total) by mouth daily.  . traZODone (DESYREL) 50 MG tablet TAKE 1 TABLET BY MOUTH EVERYDAY AT BEDTIME  .  umeclidinium bromide (INCRUSE ELLIPTA) 62.5 MCG/INH AEPB Inhale 1 puff into the lungs daily.  . varenicline (CHANTIX STARTING MONTH PAK) 0.5 MG X 11 & 1 MG X 42 tablet Take one 0.5 mg tablet by mouth once daily for 3 days, then increase to one 0.5 mg tablet twice daily for 4 days, then increase to one 1 mg tablet twice daily.  . [DISCONTINUED] ibuprofen (ADVIL,MOTRIN) 800 MG tablet Take 1 tablet (800 mg total) by mouth every 8 (eight) hours as needed for mild pain.   No facility-administered encounter medications on file as of 09/08/2019.    Allergies as of 09/08/2019 - Review Complete 09/08/2019  Allergen Reaction Noted  . Jasmine oil Shortness Of Breath and Swelling  02/12/2013  . Levaquin [levofloxacin] Shortness Of Breath 04/09/2019  . Penicillins Anaphylaxis and Hives 02/12/2013  . Aleve [naproxen sodium] Itching 02/12/2013  . Benadryl [diphenhydramine] Other (See Comments) 02/12/2013  . Ceftin [cefuroxime axetil] Other (See Comments) 02/12/2013  . Depo-provera [medroxyprogesterone acetate] Other (See Comments) 02/12/2013  . Doxycycline  01/14/2018  . Oxycodone Nausea And Vomiting 02/12/2013  . Tramadol Other (See Comments) 02/12/2013    Past Medical History:  Diagnosis Date  . Allergy   . Anxiety   . Arthritis    hands  . Asthma   . Carpal tunnel syndrome   . Cataract    start bilateral  . COPD (chronic obstructive pulmonary disease) ([place])   . Depression   . Diverticulitis   . Essential hypertension, benign 07/14/2019  . Hyperlipidemia     Past Surgical History:  Procedure Laterality Date  . ABDOMINAL HYSTERECTOMY  2009  . APPENDECTOMY    . ARTHROSCOPIC REPAIR ACL Left 2011   x 2  . BUNIONECTOMY  2006   both  . CARPAL TUNNEL RELEASE Bilateral 10/28/2013   Procedure: BILATERAL CARPAL TUNNEL RELEASE;  Surgeon: Wynonia Sours, MD;  Location: [facility];  Service: Orthopedics;  Laterality: Bilateral;  . DILATION AND CURETTAGE OF UTERUS    . TUBAL LIGATION  2004    Family History  Problem Relation Age of Onset  . Cancer Mother   . Cancer Sister 78       breast cancer  . Breast cancer Sister   . Breast cancer Sister   . Ovarian cancer Sister   . Breast cancer Sister   . Colon cancer Neg Hx   . Colon polyps Neg Hx   . Esophageal cancer Neg Hx   . Rectal cancer Neg Hx   . Stomach cancer Neg Hx     Social History   Socioeconomic History  . Marital status: Married    Spouse name: [name]  . Number of children: 3  . Years of education: 12th grade  . Highest education level: Not on file  Occupational History  . Occupation: Airline pilot: [employer]  Tobacco Use  . Smoking status: Current Every  Day Smoker    Packs/day: 1.00    Years: 34.00    Pack years: 34.00    Types: Cigarettes  . Smokeless tobacco: Never Used  . Tobacco comment: on Chantix  Substance and Sexual Activity  . Alcohol use: Yes    Comment: everyday  . Drug use: No  . Sexual activity: Yes  Other Topics Concern  . Not on file  Social History Narrative   Lives with her husband.   Their 3 children live independently.   Social Determinants of Health   Financial Resource Strain:   .  Difficulty of Paying Living Expenses:   Food Insecurity:   . Worried About Charity fundraiser in the Last Year:   . Arboriculturist in the Last Year:   Transportation Needs:   . Film/video editor (Medical):   Marland Kitchen Lack of Transportation (Non-Medical):   Physical Activity:   . Days of Exercise per Week:   . Minutes of Exercise per Session:   Stress:   . Feeling of Stress :   Social Connections:   . Frequency of Communication with Friends and Family:   . Frequency of Social Gatherings with Friends and Family:   . Attends Religious Services:   . Active Member of Clubs or Organizations:   . Attends Archivist Meetings:   Marland Kitchen Marital Status:   Intimate Partner Violence:   . Fear of Current or Ex-Partner:   . Emotionally Abused:   Marland Kitchen Physically Abused:   . Sexually Abused:     Review of Systems  Respiratory: Positive for shortness of breath.   Cardiovascular: Negative for leg swelling.  Psychiatric/Behavioral: The patient is nervous/anxious.   All other systems reviewed and are negative.   Vitals:   09/08/19 0856  BP: 118/78  Temp: (!) 97.2 F (36.2 C)  SpO2: (!) 89%     Physical Exam  Constitutional: She is oriented to person, place, and time. She appears well-developed and well-nourished.  HENT:  Head: Normocephalic and atraumatic.  Eyes: Pupils are equal, round, and reactive to light. Right eye exhibits no discharge. Left eye exhibits no discharge.  Neck: No tracheal deviation present. No  thyromegaly present.  Cardiovascular: Normal rate and regular rhythm.  Pulmonary/Chest: Effort normal and breath sounds normal. No respiratory distress. She has no wheezes. She has no rales. She exhibits no tenderness.  Musculoskeletal:        General: No edema. Normal range of motion.     Cervical back: Normal range of motion and neck supple.  Neurological: She is alert and oriented to person, place, and time. No cranial nerve deficit.  Skin: Skin is warm and dry. No erythema.  Psychiatric: She has a normal mood and affect.   Data Reviewed: CT chest 04/08/2019-areas of emphysema, some atelectasis at the right base, some groundglass changes  Chest x-ray 08/17/2019 shows flattening of the diaphragm  Assessment:  Chronic obstructive pulmonary disease -Based on severity and symptomatology likely stage IV  Active smoker -She is working on quitting -Has cut down significantly -Currently on Chantix  Nocturnal hypoxemia -Currently on oxygen supplementation  Patient was ambulated in the office today Saturations 88 to 89%, did not drop with activity  Plan/Recommendations: Smoking cessation counseling  Graded exercises  Obtain PFT  Obtain echocardiogram for dyspnea on exertion  Concern for presence of pulmonary hypertension relating to lung disease  Importance of graded exercises was discussed Significant importance of quitting smoking  If PFT is borderline and no significant pulmonary hypertension, high-resolution CT may be considered  Encouraged to continue using Advair, Incruse, albuterol as needed  Sherrilyn Rist MD [facility] 09/08/2019, 9:46 AM  CC: Rutherford Guys, MD

## 2019-09-09 ENCOUNTER — Other Ambulatory Visit: Payer: Self-pay

## 2019-09-09 ENCOUNTER — Ambulatory Visit (HOSPITAL_COMMUNITY): Payer: 59 | Attending: Internal Medicine

## 2019-09-09 DIAGNOSIS — J449 Chronic obstructive pulmonary disease, unspecified: Secondary | ICD-10-CM | POA: Diagnosis not present

## 2019-09-09 DIAGNOSIS — R079 Chest pain, unspecified: Secondary | ICD-10-CM

## 2019-09-09 DIAGNOSIS — F17200 Nicotine dependence, unspecified, uncomplicated: Secondary | ICD-10-CM | POA: Diagnosis present

## 2019-09-09 DIAGNOSIS — R0602 Shortness of breath: Secondary | ICD-10-CM | POA: Diagnosis not present

## 2019-09-09 DIAGNOSIS — Z8249 Family history of ischemic heart disease and other diseases of the circulatory system: Secondary | ICD-10-CM | POA: Diagnosis not present

## 2019-09-09 LAB — MYOCARDIAL PERFUSION IMAGING
LV dias vol: 74 mL (ref 46–106)
LV sys vol: 25 mL
Peak HR: 129 {beats}/min
Rest HR: 90 {beats}/min
SDS: 3
SRS: 0
SSS: 3
TID: 0.87

## 2019-09-09 MED ORDER — TECHNETIUM TC 99M TETROFOSMIN IV KIT
30.9000 | PACK | Freq: Once | INTRAVENOUS | Status: AC | PRN
  Administered 2019-09-09: 10:00:00 30.9 via INTRAVENOUS
  Filled 2019-09-09: qty 31

## 2019-09-09 MED ORDER — TECHNETIUM TC 99M TETROFOSMIN IV KIT
11.0000 | PACK | Freq: Once | INTRAVENOUS | Status: AC | PRN
  Administered 2019-09-09: 11 via INTRAVENOUS
  Filled 2019-09-09: qty 11

## 2019-09-09 MED ORDER — REGADENOSON 0.4 MG/5ML IV SOLN
0.4000 mg | Freq: Once | INTRAVENOUS | Status: AC
  Administered 2019-09-09: 0.4 mg via INTRAVENOUS

## 2019-09-11 ENCOUNTER — Ambulatory Visit (HOSPITAL_COMMUNITY)
Admission: RE | Admit: 2019-09-11 | Discharge: 2019-09-11 | Disposition: A | Discharge status: Home / Self Care | Payer: 59 | Source: Ambulatory Visit | Attending: Cardiovascular Disease | Admitting: Cardiovascular Disease

## 2019-09-11 ENCOUNTER — Other Ambulatory Visit: Payer: Self-pay

## 2019-09-11 DIAGNOSIS — M79604 Pain in right leg: Secondary | ICD-10-CM | POA: Diagnosis not present

## 2019-09-11 DIAGNOSIS — M79605 Pain in left leg: Secondary | ICD-10-CM | POA: Diagnosis not present

## 2019-09-11 DIAGNOSIS — R209 Unspecified disturbances of skin sensation: Secondary | ICD-10-CM | POA: Diagnosis present

## 2019-09-15 ENCOUNTER — Ambulatory Visit
Admission: RE | Admit: 2019-09-15 | Discharge: 2019-09-15 | Disposition: A | Discharge status: Home / Self Care | Payer: 59 | Source: Ambulatory Visit | Attending: Family Medicine | Admitting: Family Medicine

## 2019-09-15 DIAGNOSIS — R945 Abnormal results of liver function studies: Secondary | ICD-10-CM

## 2019-09-19 ENCOUNTER — Other Ambulatory Visit: Payer: Self-pay | Admitting: Family Medicine

## 2019-09-19 DIAGNOSIS — G2581 Restless legs syndrome: Secondary | ICD-10-CM

## 2019-09-19 DIAGNOSIS — J449 Chronic obstructive pulmonary disease, unspecified: Secondary | ICD-10-CM

## 2019-09-19 NOTE — Telephone Encounter (Signed)
Requested Prescriptions  Pending Prescriptions Disp Refills  . Fluticasone-Salmeterol (ADVAIR DISKUS) 500-50 MCG/DOSE AEPB [Pharmacy Med Name: ADVAIR 500-50 DISKUS] 60 each 1    Sig: INHALE 1 PUFF INTO THE LUNGS TWICE A DAY     Pulmonology:  Combination Products Passed - 09/19/2019  2:31 PM      Passed - Valid encounter within last 12 months    Recent Outpatient Visits          1 month ago DOE (dyspnea on exertion)   [facility], Lilia Argue, MD   2 months ago Generalized anxiety disorder   [facility], Lilia Argue, MD   4 months ago History of acute pyelonephritis   [facility], Lilia Argue, MD   5 months ago History of acute pyelonephritis   [facility], Lilia Argue, MD   6 months ago Annual physical exam   [facility], Lilia Argue, MD      Future Appointments            In 1 week Rutherford Guys, MD [facility] [place], [facility]   In 1 week Laurin Coder, MD [facility]   In 1 month Hilty, Nadean Corwin, MD [facility], [facility]           . rOPINIRole (REQUIP) 1 MG tablet [Pharmacy Med Name: ROPINIROLE HCL 1 MG TABLET] 30 tablet 0    Sig: TAKE 1 TABLET (1 MG TOTAL) BY MOUTH AT BEDTIME.     Neurology:  Parkinsonian Agents Passed - 09/19/2019  2:31 PM      Passed - Last BP in normal range    BP Readings from Last 1 Encounters:  09/08/19 118/78         Passed - Valid encounter within last 12 months    Recent Outpatient Visits          1 month ago DOE (dyspnea on exertion)   [facility], Lilia Argue, MD   2 months ago Generalized anxiety disorder   [facility], Lilia Argue, MD   4 months ago History of acute pyelonephritis   [facility], Lilia Argue, MD   5 months ago History of acute pyelonephritis   [facility], Lilia Argue, MD   6 months ago Annual physical exam   [facility], Lilia Argue, MD       Future Appointments            In 1 week Rutherford Guys, MD [facility] at [place], [facility]   In 1 week Laurin Coder, MD [facility]   In 1 month Hilty, Nadean Corwin, MD [facility], [facility]

## 2019-09-24 ENCOUNTER — Other Ambulatory Visit: Payer: Self-pay | Admitting: Family Medicine

## 2019-09-24 DIAGNOSIS — R062 Wheezing: Secondary | ICD-10-CM

## 2019-09-26 ENCOUNTER — Other Ambulatory Visit (HOSPITAL_COMMUNITY)
Admission: RE | Admit: 2019-09-26 | Discharge: 2019-09-26 | Disposition: A | Discharge status: Home / Self Care | Payer: 59 | Source: Ambulatory Visit | Attending: Pulmonary Disease | Admitting: Pulmonary Disease

## 2019-09-26 DIAGNOSIS — Z20822 Contact with and (suspected) exposure to covid-19: Secondary | ICD-10-CM | POA: Insufficient documentation

## 2019-09-26 DIAGNOSIS — Z01812 Encounter for preprocedural laboratory examination: Secondary | ICD-10-CM | POA: Insufficient documentation

## 2019-09-26 LAB — SARS CORONAVIRUS 2 (TAT 6-24 HRS): SARS Coronavirus 2: NEGATIVE

## 2019-09-28 ENCOUNTER — Ambulatory Visit: Payer: 59 | Admitting: Family Medicine

## 2019-09-28 ENCOUNTER — Ambulatory Visit: Payer: 59 | Admitting: Gastroenterology

## 2019-09-28 ENCOUNTER — Telehealth: Payer: Self-pay | Admitting: Gastroenterology

## 2019-10-01 ENCOUNTER — Ambulatory Visit (INDEPENDENT_AMBULATORY_CARE_PROVIDER_SITE_OTHER): Payer: 59 | Admitting: Pulmonary Disease

## 2019-10-01 ENCOUNTER — Encounter: Payer: Self-pay | Admitting: Pulmonary Disease

## 2019-10-01 ENCOUNTER — Other Ambulatory Visit: Payer: Self-pay

## 2019-10-01 VITALS — BP 126/84 | HR 100 | Temp 98.6°F | Ht 63.0 in | Wt 159.2 lb

## 2019-10-01 DIAGNOSIS — J42 Unspecified chronic bronchitis: Secondary | ICD-10-CM

## 2019-10-01 NOTE — Patient Instructions (Signed)
Continue efforts at quitting smoking Continue graded exercises  Continue current inhalers  We will follow up on your echocardiogram Follow-up on pulmonary function testing  Follow-up in the office in about 6 months  Call with significant concerns

## 2019-10-01 NOTE — Progress Notes (Signed)
Vicki [name]    SG:[id number]    01-05-1966  Primary Care Physician:Santiago, Lilia Argue, MD  Referring Physician: Rutherford Guys, MD [address] 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 acetaminophen (TYLENOL) 325 MG tablet Take 650 mg by mouth every 6 (six) hours as needed.  Marland Kitchen albuterol (VENTOLIN HFA) 108 (90 Base) MCG/ACT inhaler Inhale 2 puffs into the lungs every 4 (four) hours as needed for wheezing or shortness of breath.  . ALPRAZolam (XANAX) 0.25 MG tablet Take 1 tablet (0.25 mg total) by mouth 3 (three) times daily as needed for anxiety.  . ALPRAZolam (XANAX) 1 MG tablet TAKE 1/2 OR 1 TABLET BY MOUTH DAILY AS NEEDED FOR ANXIETY OR SLEEP  . amLODipine (NORVASC) 5 MG tablet Take 1 tablet (5 mg total) by mouth daily.  Marland Kitchen aspirin EC 81 MG tablet Take 81 mg by mouth daily.  . cetirizine (ZYRTEC) 10 MG tablet Take 1 tablet (10 mg total) by mouth daily.  . citalopram (CELEXA) 20 MG tablet Take 3 tablets (60 mg total) by mouth daily.  Marland Kitchen eletriptan (RELPAX) 40 MG tablet TAKE 1 TAB BY MOUTH AT ONSET OF MIGRAINE OR HEADACHE AS NEEDED. MAY  REPEAT IN 2 HOURS IF PERSISTS (Patient taking differently: Take 40 mg by mouth See admin instructions. Take 40mg  at onset of migraine or headache as needed. May repeat in 2 hours if symptoms persist.)  . Fluticasone-Salmeterol (ADVAIR DISKUS) 500-50 MCG/DOSE AEPB INHALE 1 PUFF INTO THE LUNGS TWICE A DAY  . gabapentin (NEURONTIN) 300 MG capsule Take 1 capsule (300 mg total) by mouth 2 (two) times daily.  Marland Kitchen glycopyrrolate (ROBINUL) 1 MG tablet Take 1 mg by mouth at bedtime.  . INCRUSE ELLIPTA 62.5 MCG/INH AEPB TAKE 1 PUFF BY MOUTH EVERY DAY  . Multiple Vitamin (MULTIVITAMIN WITH MINERALS) TABS tablet Take 1 tablet by mouth daily.  . ondansetron (ZOFRAN ODT) 4 MG disintegrating tablet Take 1 tablet (4 mg total) by mouth daily as needed for nausea or vomiting.  . ondansetron (ZOFRAN) 8 MG tablet TAKE 1 TABLET (8 MG TOTAL) BY MOUTH EVERY 8 (EIGHT) HOURS AS NEEDED FOR NAUSEA OR VOMITING.  . prazosin (MINIPRESS) 1 MG capsule TAKE 1 TO 2 CAPSULES (1 TO 2 MG TOTAL) BY MOUTH AT BEDTIME.  . promethazine (PHENERGAN) 25 MG tablet Take by mouth every 8 hours as needed.  Marland Kitchen rOPINIRole (REQUIP) 1 MG tablet TAKE 1 TABLET (1 MG TOTAL) BY MOUTH AT BEDTIME.  Marland Kitchen thiamine 100 MG tablet Take 1 tablet (100 mg total) by mouth daily.  . traZODone (DESYREL) 50  MG tablet TAKE 1 TABLET BY MOUTH EVERYDAY AT BEDTIME  . varenicline (CHANTIX STARTING MONTH PAK) 0.5 MG X 11 & 1 MG X 42 tablet Take one 0.5 mg tablet by mouth once daily for 3 days, then increase to one 0.5 mg tablet twice daily for 4 days, then increase to one 1 mg tablet twice daily.  . [DISCONTINUED] ibuprofen (ADVIL,MOTRIN) 800 MG tablet Take 1 tablet (800 mg total) by mouth every 8 (eight) hours as needed for mild pain.   No facility-administered encounter medications on file as of 10/01/2019.    Allergies as of 10/01/2019 - Review Complete 10/01/2019  Allergen Reaction Noted  . Jasmine oil Shortness Of Breath and Swelling 02/12/2013  . Levaquin [levofloxacin]  Shortness Of Breath 04/09/2019  . Penicillins Anaphylaxis and Hives 02/12/2013  . Aleve [naproxen sodium] Itching 02/12/2013  . Benadryl [diphenhydramine] Other (See Comments) 02/12/2013  . Ceftin [cefuroxime axetil] Other (See Comments) 02/12/2013  . Depo-provera [medroxyprogesterone acetate] Other (See Comments) 02/12/2013  . Doxycycline  01/14/2018  . Oxycodone Nausea And Vomiting 02/12/2013  . Tramadol Other (See Comments) 02/12/2013    Past Medical History:  Diagnosis Date  . Allergy   . Anxiety   . Arthritis    hands  . Asthma   . Carpal tunnel syndrome   . Cataract    start bilateral  . COPD (chronic obstructive pulmonary disease) ([place])   . Depression   . Diverticulitis   . Essential hypertension, benign 07/14/2019  . Hyperlipidemia     Past Surgical History:  Procedure Laterality Date  . ABDOMINAL HYSTERECTOMY  2009  . APPENDECTOMY    . ARTHROSCOPIC REPAIR ACL Left 2011   x 2  . BUNIONECTOMY  2006   both  . CARPAL TUNNEL RELEASE Bilateral 10/28/2013   Procedure: BILATERAL CARPAL TUNNEL RELEASE;  Surgeon: Wynonia Sours, MD;  Location: [facility];  Service: Orthopedics;  Laterality: Bilateral;  . DILATION AND CURETTAGE OF UTERUS    . TUBAL LIGATION  2004    Family History  Problem Relation Age of Onset  . Cancer Mother   . Cancer Sister 85       breast cancer  . Breast cancer Sister   . Breast cancer Sister   . Ovarian cancer Sister   . Breast cancer Sister   . Colon cancer Neg Hx   . Colon polyps Neg Hx   . Esophageal cancer Neg Hx   . Rectal cancer Neg Hx   . Stomach cancer Neg Hx     Social History   Socioeconomic History  . Marital status: Married    Spouse name: [name]  . Number of children: 3  . Years of education: 12th grade  . Highest education level: Not on file  Occupational History  . Occupation: Airline pilot: [employer]  Tobacco Use  . Smoking status: Current Every Day Smoker    Packs/day: 1.00     Years: 34.00    Pack years: 34.00    Types: Cigarettes  . Smokeless tobacco: Never Used  . Tobacco comment: on Chantix  Substance and Sexual Activity  . Alcohol use: Yes    Comment: everyday  . Drug use: No  . Sexual activity: Yes  Other Topics Concern  . Not on file  Social History Narrative   Lives with her husband.   Their 3 children live independently.   Social Determinants of Health   Financial Resource Strain:   . Difficulty of  Paying Living Expenses:   Food Insecurity:   . Worried About Charity fundraiser in the Last Year:   . Arboriculturist in the Last Year:   Transportation Needs:   . Film/video editor (Medical):   Marland Kitchen Lack of Transportation (Non-Medical):   Physical Activity:   . Days of Exercise per Week:   . Minutes of Exercise per Session:   Stress:   . Feeling of Stress :   Social Connections:   . Frequency of Communication with Friends and Family:   . Frequency of Social Gatherings with Friends and Family:   . Attends Religious Services:   . Active Member of Clubs or Organizations:   . Attends Archivist Meetings:   Marland Kitchen Marital Status:   Intimate Partner Violence:   . Fear of Current or Ex-Partner:   . Emotionally Abused:   Marland Kitchen Physically Abused:   . Sexually Abused:     Review of Systems  HENT: Negative.   Respiratory: Positive for cough and shortness of breath.   Cardiovascular: Negative for leg swelling.  Psychiatric/Behavioral: The patient is nervous/anxious.   All other systems reviewed and are negative.   Vitals:   10/01/19 0941  BP: 126/84  Pulse: 100  SpO2: 93%     Physical Exam  Constitutional: She appears well-developed and well-nourished.  HENT:  Head: Normocephalic and atraumatic.  Eyes: Pupils are equal, round, and reactive to light. Right eye exhibits no discharge. Left eye exhibits no discharge.  Neck: No tracheal deviation present. No thyromegaly present.  Cardiovascular: Normal rate and regular rhythm.    Pulmonary/Chest: Effort normal and breath sounds normal. No respiratory distress. She has no wheezes. She has no rales. She exhibits no tenderness.  Musculoskeletal:        General: No edema.  Psychiatric: She has a normal mood and affect.   Data Reviewed: CT chest 04/08/2019-areas of emphysema, some atelectasis at the right base, some groundglass changes  Chest x-ray 08/17/2019 shows flattening of the diaphragm  She has a PFT and echocardiogram pending  Assessment:  Chronic obstructive pulmonary disease -Based on severity and symptomatology likely stage IV -PFT pending  Active smoker -She is working on quitting -Has cut down significantly-only smoking about 3 to 4 cigarettes -Currently on Chantix-has been on Chantix now for about 8 days  Nocturnal hypoxemia -Currently on oxygen supplementation  She is doing graded exercises on a regular basis  Plan/Recommendations: Smoking cessation counseling  Graded exercises  Follow-up on PFT and echocardiogram  If PFT is borderline and no significant pulmonary hypertension, high-resolution CT may be considered   Encouraged to continue using Advair, Incruse, albuterol as needed  Sherrilyn Rist MD [facility] 10/01/2019, 9:43 AM  CC: Rutherford Guys, MD

## 2019-10-03 ENCOUNTER — Other Ambulatory Visit: Payer: Self-pay | Admitting: Nurse Practitioner

## 2019-10-03 ENCOUNTER — Other Ambulatory Visit: Payer: Self-pay | Admitting: Family Medicine

## 2019-10-03 DIAGNOSIS — J449 Chronic obstructive pulmonary disease, unspecified: Secondary | ICD-10-CM

## 2019-10-03 DIAGNOSIS — G2581 Restless legs syndrome: Secondary | ICD-10-CM

## 2019-10-03 DIAGNOSIS — R112 Nausea with vomiting, unspecified: Secondary | ICD-10-CM

## 2019-10-03 NOTE — Telephone Encounter (Signed)
Requested Prescriptions  Pending Prescriptions Disp Refills  . rOPINIRole (REQUIP) 1 MG tablet [Pharmacy Med Name: ROPINIROLE HCL 1 MG TABLET] 90 tablet 1    Sig: TAKE 1 TABLET (1 MG TOTAL) BY MOUTH AT BEDTIME.     Neurology:  Parkinsonian Agents Passed - 10/03/2019 10:42 AM      Passed - Last BP in normal range    BP Readings from Last 1 Encounters:  10/01/19 126/84         Passed - Valid encounter within last 12 months    Recent Outpatient Visits          1 month ago DOE (dyspnea on exertion)   [facility], Lilia Argue, MD   2 months ago Generalized anxiety disorder   [facility], Lilia Argue, MD   5 months ago History of acute pyelonephritis   [facility], Lilia Argue, MD   5 months ago History of acute pyelonephritis   [facility], Lilia Argue, MD   6 months ago Annual physical exam   [facility], Lilia Argue, MD      Future Appointments            In 1 week Rutherford Guys, MD [facility] at [place], [facility]   In 2 weeks Hilty, Nadean Corwin, MD [facility], [facility]           . Fluticasone-Salmeterol (ADVAIR DISKUS) 500-50 MCG/DOSE AEPB [Pharmacy Med Name: ADVAIR 500-50 DISKUS] 180 each 1    Sig: INHALE 1 PUFF INTO THE LUNGS TWICE A DAY     Pulmonology:  Combination Products Passed - 10/03/2019 10:42 AM      Passed - Valid encounter within last 12 months    Recent Outpatient Visits          1 month ago DOE (dyspnea on exertion)   [facility], Lilia Argue, MD   2 months ago Generalized anxiety disorder   [facility], Lilia Argue, MD   5 months ago History of acute pyelonephritis   [facility], Lilia Argue, MD   5 months ago History of acute pyelonephritis   [facility], Lilia Argue, MD   6 months ago Annual physical exam   [facility], Lilia Argue, MD      Future Appointments            In 1 week  Rutherford Guys, MD Primary [facility] at [place], [facility]   In 2 weeks Hilty, Nadean Corwin, MD [place], [facility]

## 2019-10-05 ENCOUNTER — Other Ambulatory Visit (HOSPITAL_COMMUNITY)
Admission: RE | Admit: 2019-10-05 | Discharge: 2019-10-05 | Disposition: A | Discharge status: Home / Self Care | Payer: 59 | Source: Ambulatory Visit | Attending: Pulmonary Disease | Admitting: Pulmonary Disease

## 2019-10-05 DIAGNOSIS — Z01812 Encounter for preprocedural laboratory examination: Secondary | ICD-10-CM | POA: Insufficient documentation

## 2019-10-05 DIAGNOSIS — Z20822 Contact with and (suspected) exposure to covid-19: Secondary | ICD-10-CM | POA: Insufficient documentation

## 2019-10-05 LAB — SARS CORONAVIRUS 2 (TAT 6-24 HRS): SARS Coronavirus 2: NEGATIVE

## 2019-10-07 ENCOUNTER — Other Ambulatory Visit: Payer: Self-pay

## 2019-10-07 ENCOUNTER — Ambulatory Visit (HOSPITAL_COMMUNITY): Payer: 59 | Attending: Cardiology

## 2019-10-07 DIAGNOSIS — R0602 Shortness of breath: Secondary | ICD-10-CM | POA: Insufficient documentation

## 2019-10-08 ENCOUNTER — Ambulatory Visit (INDEPENDENT_AMBULATORY_CARE_PROVIDER_SITE_OTHER): Payer: 59 | Admitting: Pulmonary Disease

## 2019-10-08 DIAGNOSIS — R0602 Shortness of breath: Secondary | ICD-10-CM

## 2019-10-08 LAB — PULMONARY FUNCTION TEST
DL/VA % pred: 67 %
DL/VA: 2.89 ml/min/mmHg/L
DLCO cor % pred: 62 %
DLCO cor: 13.07 ml/min/mmHg
DLCO unc % pred: 64 %
DLCO unc: 13.53 ml/min/mmHg
FEF 25-75 Post: 0.66 L/sec
FEF 25-75 Pre: 0.65 L/sec
FEF2575-%Change-Post: 1 %
FEF2575-%Pred-Post: 24 %
FEF2575-%Pred-Pre: 23 %
FEV1-%Change-Post: 0 %
FEV1-%Pred-Post: 56 %
FEV1-%Pred-Pre: 57 %
FEV1-Post: 1.57 L
FEV1-Pre: 1.59 L
FEV1FVC-%Change-Post: 0 %
FEV1FVC-%Pred-Pre: 69 %
FEV6-%Change-Post: -2 %
FEV6-%Pred-Post: 78 %
FEV6-%Pred-Pre: 81 %
FEV6-Post: 2.7 L
FEV6-Pre: 2.77 L
FEV6FVC-%Change-Post: 0 %
FEV6FVC-%Pred-Post: 99 %
FEV6FVC-%Pred-Pre: 99 %
FVC-%Change-Post: -1 %
FVC-%Pred-Post: 80 %
FVC-%Pred-Pre: 81 %
FVC-Post: 2.83 L
FVC-Pre: 2.87 L
Post FEV1/FVC ratio: 56 %
Post FEV6/FVC ratio: 97 %
Pre FEV1/FVC ratio: 55 %
Pre FEV6/FVC Ratio: 97 %
RV % pred: 160 %
RV: 2.91 L
TLC % pred: 117 %
TLC: 5.94 L

## 2019-10-08 NOTE — Progress Notes (Signed)
PFT done today. 

## 2019-10-13 ENCOUNTER — Other Ambulatory Visit: Payer: Self-pay

## 2019-10-13 ENCOUNTER — Encounter: Payer: 59 | Admitting: Family Medicine

## 2019-10-13 NOTE — Progress Notes (Signed)
Called twice no answer  This encounter was created in error - please disregard.

## 2019-10-13 NOTE — Progress Notes (Deleted)
Virtual Visit Note  I connected with patient on 10/13/19 at@ by *** and verified that I am speaking with the correct person using two identifiers. Vicki [name] is currently located at *** and {family members:[phone]} is currently with them during visit. The provider, Rutherford Guys, MD is located in their office at time of visit.  I discussed the limitations, risks, security and privacy concerns of performing an evaluation and management service by telephone and the availability of in person appointments. I also discussed with the patient that there may be a patient responsible charge related to this service. The patient expressed understanding and agreed to proceed.   I provided *** minutes of non-face-to-face time during this encounter.  CC: ***  HPI ? PMH: COPD, HTN, HLP, anxiety, diverticulitis, RLS  Last OV April 2021  GI -   Cards - myoview, statin, ABI  Pulm - COPD stage IV, chantix, PFTs, graded exercises, echo  Echo - unremarkable myoview - low risk Allergies  Allergen Reactions  . Jasmine Oil Shortness Of Breath and Swelling  . Levaquin [Levofloxacin] Shortness Of Breath    Rash, swelling, itching  . Penicillins Anaphylaxis and Hives    Tolerated ceftriaxone  . Aleve [Naproxen Sodium] Itching  . Benadryl [Diphenhydramine] Other (See Comments)    hallucinations  . Ceftin [Cefuroxime Axetil] Other (See Comments)    unsure  . Depo-Provera [Medroxyprogesterone Acetate] Other (See Comments)    Severe acne  . Doxycycline     Raises blood pressure   . Oxycodone Nausea And Vomiting  . Tramadol Other (See Comments)    Kept her up all night    Prior to Admission medications   Medication Sig Start Date End Date Taking? Authorizing Provider  acetaminophen (TYLENOL) 325 MG tablet Take 650 mg by mouth every 6 (six) hours as needed.    [provider]  albuterol (VENTOLIN HFA) 108 (90 Base) MCG/ACT inhaler Inhale 2 puffs into the lungs every 4 (four)  hours as needed for wheezing or shortness of breath. 03/12/19   Rutherford Guys, MD  ALPRAZolam Duanne Moron) 0.25 MG tablet Take 1 tablet (0.25 mg total) by mouth 3 (three) times daily as needed for anxiety. 04/11/19   Swayze, Ava, DO  ALPRAZolam (XANAX) 1 MG tablet TAKE 1/2 OR 1 TABLET BY MOUTH DAILY AS NEEDED FOR ANXIETY OR SLEEP 07/23/19   Rutherford Guys, MD  amLODipine (NORVASC) 5 MG tablet Take 1 tablet (5 mg total) by mouth daily. 07/14/19   Rutherford Guys, MD  aspirin EC 81 MG tablet Take 81 mg by mouth daily.    [provider]  cetirizine (ZYRTEC) 10 MG tablet Take 1 tablet (10 mg total) by mouth daily. 07/19/17   McVey, Gelene Mink, PA-C  citalopram (CELEXA) 20 MG tablet Take 3 tablets (60 mg total) by mouth daily. 07/14/19   Rutherford Guys, MD  eletriptan (RELPAX) 40 MG tablet TAKE 1 TAB BY MOUTH AT ONSET OF MIGRAINE OR HEADACHE AS NEEDED. MAY REPEAT IN 2 HOURS IF PERSISTS Patient taking differently: Take 40 mg by mouth See admin instructions. Take 40mg  at onset of migraine or headache as needed. May repeat in 2 hours if symptoms persist. 03/12/19   Rutherford Guys, MD  Fluticasone-Salmeterol (ADVAIR DISKUS) 500-50 MCG/DOSE AEPB INHALE 1 PUFF INTO THE LUNGS TWICE A DAY 10/03/19   Rutherford Guys, MD  gabapentin (NEURONTIN) 300 MG capsule Take 1 capsule (300 mg total) by mouth 2 (two) times daily. 08/17/19  Rutherford Guys, MD  glycopyrrolate (ROBINUL) 1 MG tablet Take 1 mg by mouth at bedtime. 07/14/19   [provider]  INCRUSE ELLIPTA 62.5 MCG/INH AEPB TAKE 1 PUFF BY MOUTH EVERY DAY 09/24/19   Rutherford Guys, MD  Multiple Vitamin (MULTIVITAMIN WITH MINERALS) TABS tablet Take 1 tablet by mouth daily. 04/12/19   Swayze, Ava, DO  ondansetron (ZOFRAN ODT) 4 MG disintegrating tablet Take 1 tablet (4 mg total) by mouth daily as needed for nausea or vomiting. 07/31/19   Willia Craze, NP  ondansetron (ZOFRAN) 8 MG tablet TAKE 1 TABLET (8 MG TOTAL) BY MOUTH EVERY 8 (EIGHT)  HOURS AS NEEDED FOR NAUSEA OR VOMITING. 07/20/19   Rutherford Guys, MD  prazosin (MINIPRESS) 1 MG capsule TAKE 1 TO 2 CAPSULES (1 TO 2 MG TOTAL) BY MOUTH AT BEDTIME. 07/20/19   Rutherford Guys, MD  promethazine (PHENERGAN) 25 MG tablet TAKE 1 TABLET BY MOUTH EVERY DAY 10/05/19   Willia Craze, NP  rOPINIRole (REQUIP) 1 MG tablet TAKE 1 TABLET (1 MG TOTAL) BY MOUTH AT BEDTIME. 10/03/19   Rutherford Guys, MD  thiamine 100 MG tablet Take 1 tablet (100 mg total) by mouth daily. 04/12/19   Swayze, Ava, DO  traZODone (DESYREL) 50 MG tablet TAKE 1 TABLET BY MOUTH EVERYDAY AT BEDTIME 06/02/19   Rutherford Guys, MD  varenicline (CHANTIX STARTING MONTH PAK) 0.5 MG X 11 & 1 MG X 42 tablet Take one 0.5 mg tablet by mouth once daily for 3 days, then increase to one 0.5 mg tablet twice daily for 4 days, then increase to one 1 mg tablet twice daily. 07/14/19   Rutherford Guys, MD  ibuprofen (ADVIL,MOTRIN) 800 MG tablet Take 1 tablet (800 mg total) by mouth every 8 (eight) hours as needed for mild pain. 04/22/18   Rutherford Guys, MD    Past Medical History:  Diagnosis Date  . Allergy   . Anxiety   . Arthritis    hands  . Asthma   . Carpal tunnel syndrome   . Cataract    start bilateral  . COPD (chronic obstructive pulmonary disease) ([place])   . Depression   . Diverticulitis   . Essential hypertension, benign 07/14/2019  . Hyperlipidemia     Past Surgical History:  Procedure Laterality Date  . ABDOMINAL HYSTERECTOMY  2009  . APPENDECTOMY    . ARTHROSCOPIC REPAIR ACL Left 2011   x 2  . BUNIONECTOMY  2006   both  . CARPAL TUNNEL RELEASE Bilateral 10/28/2013   Procedure: BILATERAL CARPAL TUNNEL RELEASE;  Surgeon: Wynonia Sours, MD;  Location: [facility];  Service: Orthopedics;  Laterality: Bilateral;  . DILATION AND CURETTAGE OF UTERUS    . TUBAL LIGATION  2004    Social History   Tobacco Use  . Smoking status: Current Every Day Smoker    Packs/day: 1.00    Years: 34.00    Pack  years: 34.00    Types: Cigarettes  . Smokeless tobacco: Never Used  . Tobacco comment: on Chantix  Substance Use Topics  . Alcohol use: Yes    Comment: everyday    Family History  Problem Relation Age of Onset  . Cancer Mother   . Cancer Sister 62       breast cancer  . Breast cancer Sister   . Breast cancer Sister   . Ovarian cancer Sister   . Breast cancer Sister   . Colon cancer  Neg Hx   . Colon polyps Neg Hx   . Esophageal cancer Neg Hx   . Rectal cancer Neg Hx   . Stomach cancer Neg Hx     ROS  Objective  Vitals as reported by the patient:   ASSESSMENT and PLAN  ***  FOLLOW-UP: ***   The above assessment and management plan was discussed with the patient. The patient verbalized understanding of and has agreed to the management plan. Patient is aware to call the clinic if symptoms persist or worsen. Patient is aware when to return to the clinic for a follow-up visit. Patient educated on when it is appropriate to go to the emergency department.     Rutherford Guys, MD Primary Care at [place], [place] [address] Ph.  [phone] Fax [phone]

## 2019-10-15 ENCOUNTER — Encounter: Payer: Self-pay | Admitting: Family Medicine

## 2019-10-15 ENCOUNTER — Other Ambulatory Visit: Payer: Self-pay

## 2019-10-15 ENCOUNTER — Telehealth (INDEPENDENT_AMBULATORY_CARE_PROVIDER_SITE_OTHER): Payer: 59 | Admitting: Family Medicine

## 2019-10-15 DIAGNOSIS — G4734 Idiopathic sleep related nonobstructive alveolar hypoventilation: Secondary | ICD-10-CM | POA: Diagnosis not present

## 2019-10-15 DIAGNOSIS — F411 Generalized anxiety disorder: Secondary | ICD-10-CM | POA: Diagnosis not present

## 2019-10-15 DIAGNOSIS — R112 Nausea with vomiting, unspecified: Secondary | ICD-10-CM | POA: Diagnosis not present

## 2019-10-15 MED ORDER — OMEPRAZOLE 20 MG PO CPDR
20.0000 mg | DELAYED_RELEASE_CAPSULE | Freq: Two times a day (BID) | ORAL | 1 refills | Status: DC
Start: 2019-10-15 — End: 2019-11-08

## 2019-10-15 MED ORDER — ALPRAZOLAM 0.5 MG PO TABS: 0.5000 mg | ORAL_TABLET | Freq: Three times a day (TID) | ORAL | 1 refills | Status: DC | PRN

## 2019-10-15 MED ORDER — PROMETHAZINE HCL 25 MG PO TABS: ORAL_TABLET | ORAL | 3 refills | Status: DC

## 2019-10-15 NOTE — Patient Instructions (Signed)
° ° ° °  If you have lab work done today you will be contacted with your lab results within the next 2 weeks.  If you have not heard from us then please contact us. The fastest way to get your results is to register for My Chart. ° ° °IF you received an x-ray today, you will receive an invoice from [facility]. Please contact [facility] at [phone] with questions or concerns regarding your invoice.  ° °IF you received labwork today, you will receive an invoice from [facility]. Please contact [facility] at [phone] with questions or concerns regarding your invoice.  ° °Our billing staff will not be able to assist you with questions regarding bills from these companies. ° °You will be contacted with the lab results as soon as they are available. The fastest way to get your results is to activate your My Chart account. Instructions are located on the last page of this paperwork. If you have not heard from us regarding the results in 2 weeks, please contact this office. °  ° ° ° °

## 2019-10-15 NOTE — Progress Notes (Signed)
Virtual Visit Note  I connected with patient on 10/15/19 at 528pm by phone and verified that I am speaking with the correct person using two identifiers. Vicki [name] is currently located at home and patient is currently with them during visit. The provider, Rutherford Guys, MD is located in their office at time of visit.  I discussed the limitations, risks, security and privacy concerns of performing an evaluation and management service by telephone and the availability of in person appointments. I also discussed with the patient that there may be a patient responsible charge related to this service. The patient expressed understanding and agreed to proceed.   I provided 30 minutes of non-face-to-face time during this encounter.  Chief Complaint  Patient presents with  . Sinus Problem    having sinus headache forthe past week  . Follow-up    wants to discuss the imaging from [facility]    HPI ? EGD had to put on hold due to drop in Oxygenation She was referred to pulmonology,  She had recently PFTs Sleep study shows nocturnal hypoxemia Patient reports that she has was never started on oxygen She was doing with smoking cessation on chantix However stressors with her son caused her to increase smoking She stopped taking taking chantix about a week ago Nausea was also doing better but then started vomiting again Takes celexa daily, prn use of prazosin, xanax, trazadone Did not do well with buspar or wellbutrin She was doing counseling and stopped  Son has been removed from the home pmp reviewed  Her husband - pulmonology and cards evaluation, pending results. Husband reports that they were having difficulty with doing echo due to presence of "hernia" [place] = fatty liver  Sees cards for followup and cardiac clearance for egd next week  Allergies  Allergen Reactions  . Jasmine Oil Shortness Of Breath and Swelling  . Levaquin [Levofloxacin] Shortness Of Breath    Rash,  swelling, itching  . Penicillins Anaphylaxis and Hives    Tolerated ceftriaxone  . Aleve [Naproxen Sodium] Itching  . Benadryl [Diphenhydramine] Other (See Comments)    hallucinations  . Ceftin [Cefuroxime Axetil] Other (See Comments)    unsure  . Depo-Provera [Medroxyprogesterone Acetate] Other (See Comments)    Severe acne  . Doxycycline     Raises blood pressure   . Oxycodone Nausea And Vomiting  . Tramadol Other (See Comments)    Kept her up all night    Prior to Admission medications   Medication Sig Start Date End Date Taking? Authorizing Provider  acetaminophen (TYLENOL) 325 MG tablet Take 650 mg by mouth every 6 (six) hours as needed.   Yes [provider]  albuterol (VENTOLIN HFA) 108 (90 Base) MCG/ACT inhaler Inhale 2 puffs into the lungs every 4 (four) hours as needed for wheezing or shortness of breath. 03/12/19  Yes Rutherford Guys, MD  ALPRAZolam Duanne Moron) 0.25 MG tablet Take 1 tablet (0.25 mg total) by mouth 3 (three) times daily as needed for anxiety. 04/11/19  Yes Swayze, Ava, DO  ALPRAZolam (XANAX) 1 MG tablet TAKE 1/2 OR 1 TABLET BY MOUTH DAILY AS NEEDED FOR ANXIETY OR SLEEP 07/23/19  Yes Rutherford Guys, MD  amLODipine (NORVASC) 5 MG tablet Take 1 tablet (5 mg total) by mouth daily. 07/14/19  Yes Rutherford Guys, MD  aspirin EC 81 MG tablet Take 81 mg by mouth daily.   Yes [provider]  cetirizine (ZYRTEC) 10 MG tablet Take 1 tablet (10  mg total) by mouth daily. 07/19/17  Yes McVey, Gelene Mink, PA-C  citalopram (CELEXA) 20 MG tablet Take 3 tablets (60 mg total) by mouth daily. 07/14/19  Yes Rutherford Guys, MD  eletriptan (RELPAX) 40 MG tablet TAKE 1 TAB BY MOUTH AT ONSET OF MIGRAINE OR HEADACHE AS NEEDED. MAY REPEAT IN 2 HOURS IF PERSISTS Patient taking differently: Take 40 mg by mouth See admin instructions. Take 40mg  at onset of migraine or headache as needed. May repeat in 2 hours if symptoms persist. 03/12/19  Yes Rutherford Guys, MD    Fluticasone-Salmeterol (ADVAIR DISKUS) 500-50 MCG/DOSE AEPB INHALE 1 PUFF INTO THE LUNGS TWICE A DAY 10/03/19  Yes Rutherford Guys, MD  gabapentin (NEURONTIN) 300 MG capsule Take 1 capsule (300 mg total) by mouth 2 (two) times daily. 08/17/19  Yes Rutherford Guys, MD  glycopyrrolate (ROBINUL) 1 MG tablet Take 1 mg by mouth at bedtime. 07/14/19  Yes [provider]  INCRUSE ELLIPTA 62.5 MCG/INH AEPB TAKE 1 PUFF BY MOUTH EVERY DAY 09/24/19  Yes Rutherford Guys, MD  Multiple Vitamin (MULTIVITAMIN WITH MINERALS) TABS tablet Take 1 tablet by mouth daily. 04/12/19  Yes Swayze, Ava, DO  ondansetron (ZOFRAN ODT) 4 MG disintegrating tablet Take 1 tablet (4 mg total) by mouth daily as needed for nausea or vomiting. 07/31/19  Yes Willia Craze, NP  ondansetron (ZOFRAN) 8 MG tablet TAKE 1 TABLET (8 MG TOTAL) BY MOUTH EVERY 8 (EIGHT) HOURS AS NEEDED FOR NAUSEA OR VOMITING. 07/20/19  Yes Rutherford Guys, MD  prazosin (MINIPRESS) 1 MG capsule TAKE 1 TO 2 CAPSULES (1 TO 2 MG TOTAL) BY MOUTH AT BEDTIME. 07/20/19  Yes Rutherford Guys, MD  promethazine (PHENERGAN) 25 MG tablet TAKE 1 TABLET BY MOUTH EVERY DAY 10/05/19  Yes Willia Craze, NP  rOPINIRole (REQUIP) 1 MG tablet TAKE 1 TABLET (1 MG TOTAL) BY MOUTH AT BEDTIME. 10/03/19  Yes Rutherford Guys, MD  thiamine 100 MG tablet Take 1 tablet (100 mg total) by mouth daily. 04/12/19  Yes Swayze, Ava, DO  traZODone (DESYREL) 50 MG tablet TAKE 1 TABLET BY MOUTH EVERYDAY AT BEDTIME 06/02/19  Yes Rutherford Guys, MD  varenicline (CHANTIX STARTING MONTH PAK) 0.5 MG X 11 & 1 MG X 42 tablet Take one 0.5 mg tablet by mouth once daily for 3 days, then increase to one 0.5 mg tablet twice daily for 4 days, then increase to one 1 mg tablet twice daily. 07/14/19  Yes Rutherford Guys, MD  ibuprofen (ADVIL,MOTRIN) 800 MG tablet Take 1 tablet (800 mg total) by mouth every 8 (eight) hours as needed for mild pain. 04/22/18   Rutherford Guys, MD    Past Medical History:   Diagnosis Date  . Allergy   . Anxiety   . Arthritis    hands  . Asthma   . Carpal tunnel syndrome   . Cataract    start bilateral  . COPD (chronic obstructive pulmonary disease) ([place])   . Depression   . Diverticulitis   . Essential hypertension, benign 07/14/2019  . Hyperlipidemia     Past Surgical History:  Procedure Laterality Date  . ABDOMINAL HYSTERECTOMY  2009  . APPENDECTOMY    . ARTHROSCOPIC REPAIR ACL Left 2011   x 2  . BUNIONECTOMY  2006   both  . CARPAL TUNNEL RELEASE Bilateral 10/28/2013   Procedure: BILATERAL CARPAL TUNNEL RELEASE;  Surgeon: Wynonia Sours, MD;  Location: [facility];  Service: Orthopedics;  Laterality: Bilateral;  . DILATION AND CURETTAGE OF UTERUS    . TUBAL LIGATION  2004    Social History   Tobacco Use  . Smoking status: Current Every Day Smoker    Packs/day: 1.00    Years: 34.00    Pack years: 34.00    Types: Cigarettes  . Smokeless tobacco: Never Used  . Tobacco comment: on Chantix  Substance Use Topics  . Alcohol use: Yes    Comment: everyday    Family History  Problem Relation Age of Onset  . Cancer Mother   . Cancer Sister 3       breast cancer  . Breast cancer Sister   . Breast cancer Sister   . Ovarian cancer Sister   . Breast cancer Sister   . Colon cancer Neg Hx   . Colon polyps Neg Hx   . Esophageal cancer Neg Hx   . Rectal cancer Neg Hx   . Stomach cancer Neg Hx     ROS Per hpi  Objective  Vitals as reported by the patient: none   ASSESSMENT and PLAN  1. Generalized anxiety disorder Worsening 2/2 problems with son. Discussed reaching out to [facility] counseling. Xanax prn. Reviewed r/se/b. pmp reviewed. Last refill march 2021  2. Nausea and vomiting, intractability of vomiting not specified, unspecified vomiting type Possible hiatal hernia per husband's report. Pending pulm (I reached out)  and cards (sees them on June 9th) clearance in order to proceed. Starting PPI. Refilled phenergan.  promethazine (PHENERGAN) 25 MG tablet; TAKE 1 TABLET BY MOUTH EVERY DAY  3. Nocturnal hypoxia Per recent sleep study. Patient has never been on nocturnal oxygen, reached out to pulm. Discussed smoking cessation/resume chantix. Discussed risk of explosion/fire with oxygen and smoking.   Other orders - ALPRAZolam (XANAX) 0.5 MG tablet; Take 1 tablet (0.5 mg total) by mouth 3 (three) times daily as needed for anxiety. - omeprazole (PRILOSEC) 20 MG capsule; Take 1 capsule (20 mg total) by mouth 2 (two) times daily before a meal.  FOLLOW-UP: after EGD   The above assessment and management plan was discussed with the patient. The patient verbalized understanding of and has agreed to the management plan. Patient is aware to call the clinic if symptoms persist or worsen. Patient is aware when to return to the clinic for a follow-up visit. Patient educated on when it is appropriate to go to the emergency department.     Rutherford Guys, MD Primary Care at [facility] [place], [place] [address] Ph.  [phone] Fax [phone]

## 2019-10-16 ENCOUNTER — Other Ambulatory Visit: Payer: Self-pay | Admitting: Family Medicine

## 2019-10-16 ENCOUNTER — Telehealth: Payer: Self-pay | Admitting: Family Medicine

## 2019-10-16 ENCOUNTER — Encounter: Payer: Self-pay | Admitting: Family Medicine

## 2019-10-16 NOTE — Telephone Encounter (Signed)
Pt has called back and I have informed her the message from the provider below.   She is aware of her upcoming appointment with Cards as well.

## 2019-10-16 NOTE — Telephone Encounter (Signed)
Please let patient and her husband Call husband [phone]) That I spoke with pulm, overnight pulse oxymetry needed before able to order nighttime oxygen (sleep study does not count)  They should expect a phone call from [place].  Also ok to proceed with EGD from pulm standpoint.  I have not reached out to cards as she has an appointment next week.  thanks

## 2019-10-21 ENCOUNTER — Ambulatory Visit: Payer: 59 | Admitting: Internal Medicine

## 2019-10-22 ENCOUNTER — Telehealth: Payer: Self-pay

## 2019-10-22 NOTE — Telephone Encounter (Signed)
Received pt order from [facility] for home use dme o2, order signed faxed bk along with face to face notes to [phone]

## 2019-10-22 NOTE — Addendum Note (Signed)
Addended by: Rutherford Guys on: 10/22/2019 09:40 AM   Modules accepted: Orders

## 2019-10-22 NOTE — Telephone Encounter (Signed)
Order has been been faxed bk to [facility] for pt to received dme for overnight o2

## 2019-11-03 NOTE — Progress Notes (Deleted)
Cardiology Office Note   Date:  11/03/2019   ID:  Vicki [name], Vicki [name] March 14, 1966, MRN [id number]  PCP:  Vicki Guys, MD  Cardiologist:  Dr. Debara [name]  No chief complaint on file.   History of Present Illness: Vicki [name] is a 54 y.o. female who presents for ongoing assessment and management of dyspnea and chest discomfort.  Originally seen by Dr. Debara [name] on 09/03/2019 at the request of Dr. Pamella [name].  She has a history of COPD but has had worsening dyspnea on exertion just walking to the end of her driveway.  She also complains of lower extremity pain.  She has a strong family history of CAD with premature CAD in her mother at age 58.  Other history includes hypertension and tobacco abuse.  As result of her symptoms and cardiovascular risk factors, Dr. Debara [name] plan for a Lexiscan Myoview stress test and started her on low-dose aspirin.  With her complaints of leg weakness he postulated that this could be related to hypoxemia or other causes of weakness including poor circulations.  She was recommended for lower extremity arterial Dopplers given her smoking history to rule out PAD.  Nuclear medicine stress test dated 09/09/2019 was low risk with no ST segment deviation, no T wave inversion, EF was greater than 65%.  Myocardial perfusion was normal.  Doppler studies dated 09/11/2019 revealed no evidence of arterial occlusive disease bilaterally.  Echocardiogram dated 10/07/2019 revealed normal LV systolic function with an EF of 60 to 65%.  No regional wall motion abnormalities.  The tricuspid regurgitation signal was inadequate for assessing PA pressure.  Right ventricular size was mildly enlarged.  Past Medical History:  Diagnosis Date  . Allergy   . Anxiety   . Arthritis    hands  . Asthma   . Carpal tunnel syndrome   . Cataract    start bilateral  . COPD (chronic obstructive pulmonary disease) ([place])   . Depression   . Diverticulitis   . Essential hypertension, benign  07/14/2019  . Hyperlipidemia     Past Surgical History:  Procedure Laterality Date  . ABDOMINAL HYSTERECTOMY  2009  . APPENDECTOMY    . ARTHROSCOPIC REPAIR ACL Left 2011   x 2  . BUNIONECTOMY  2006   both  . CARPAL TUNNEL RELEASE Bilateral 10/28/2013   Procedure: BILATERAL CARPAL TUNNEL RELEASE;  Surgeon: Wynonia Sours, MD;  Location: [facility];  Service: Orthopedics;  Laterality: Bilateral;  . DILATION AND CURETTAGE OF UTERUS    . TUBAL LIGATION  2004     Current Outpatient Medications  Medication Sig Dispense Refill  . acetaminophen (TYLENOL) 325 MG tablet Take 650 mg by mouth every 6 (six) hours as needed.    Marland Kitchen albuterol (VENTOLIN HFA) 108 (90 Base) MCG/ACT inhaler Inhale 2 puffs into the lungs every 4 (four) hours as needed for wheezing or shortness of breath. 18 g 3  . ALPRAZolam (XANAX) 0.5 MG tablet Take 1 tablet (0.5 mg total) by mouth 3 (three) times daily as needed for anxiety. 30 tablet 1  . amLODipine (NORVASC) 5 MG tablet Take 1 tablet (5 mg total) by mouth daily. 90 tablet 3  . aspirin EC 81 MG tablet Take 81 mg by mouth daily.    . cetirizine (ZYRTEC) 10 MG tablet Take 1 tablet (10 mg total) by mouth daily. 30 tablet 11  . citalopram (CELEXA) 20 MG tablet Take 3 tablets (60 mg total) by mouth daily. 270 tablet 2  . eletriptan (  RELPAX) 40 MG tablet TAKE 1 TAB BY MOUTH AT ONSET OF MIGRAINE OR HEADACHE AS NEEDED. MAY REPEAT IN 2 HOURS IF PERSISTS (Patient taking differently: Take 40 mg by mouth See admin instructions. Take 40mg  at onset of migraine or headache as needed. May repeat in 2 hours if symptoms persist.) 10 tablet 5  . Fluticasone-Salmeterol (ADVAIR DISKUS) 500-50 MCG/DOSE AEPB INHALE 1 PUFF INTO THE LUNGS TWICE A DAY 180 each 1  . gabapentin (NEURONTIN) 300 MG capsule Take 1 capsule (300 mg total) by mouth 2 (two) times daily. 60 capsule 3  . glycopyrrolate (ROBINUL) 1 MG tablet Take 1 mg by mouth at bedtime.    . INCRUSE ELLIPTA 62.5 MCG/INH AEPB  TAKE 1 PUFF BY MOUTH EVERY DAY 30 each 6  . Multiple Vitamin (MULTIVITAMIN WITH MINERALS) TABS tablet Take 1 tablet by mouth daily. 30 tablet 0  . omeprazole (PRILOSEC) 20 MG capsule Take 1 capsule (20 mg total) by mouth 2 (two) times daily before a meal. 60 capsule 1  . ondansetron (ZOFRAN ODT) 4 MG disintegrating tablet Take 1 tablet (4 mg total) by mouth daily as needed for nausea or vomiting. 30 tablet 0  . ondansetron (ZOFRAN) 8 MG tablet TAKE 1 TABLET (8 MG TOTAL) BY MOUTH EVERY 8 (EIGHT) HOURS AS NEEDED FOR NAUSEA OR VOMITING. 9 tablet 2  . prazosin (MINIPRESS) 1 MG capsule TAKE 1 TO 2 CAPSULES (1 TO 2 MG TOTAL) BY MOUTH AT BEDTIME. 180 capsule 1  . promethazine (PHENERGAN) 25 MG tablet TAKE 1 TABLET BY MOUTH EVERY DAY 30 tablet 3  . rOPINIRole (REQUIP) 1 MG tablet TAKE 1 TABLET (1 MG TOTAL) BY MOUTH AT BEDTIME. 90 tablet 1  . thiamine 100 MG tablet Take 1 tablet (100 mg total) by mouth daily. 30 tablet 0  . traZODone (DESYREL) 50 MG tablet TAKE 1 TABLET BY MOUTH EVERYDAY AT BEDTIME 90 tablet 1  . varenicline (CHANTIX STARTING MONTH PAK) 0.5 MG X 11 & 1 MG X 42 tablet Take one 0.5 mg tablet by mouth once daily for 3 days, then increase to one 0.5 mg tablet twice daily for 4 days, then increase to one 1 mg tablet twice daily. 53 tablet 0   No current facility-administered medications for this visit.    Allergies:   Jasmine oil, Levaquin [levofloxacin], Penicillins, Aleve [naproxen sodium], Benadryl [diphenhydramine], Ceftin [cefuroxime axetil], Depo-provera [medroxyprogesterone acetate], Doxycycline, Oxycodone, and Tramadol    Social History:  The patient  reports that she has been smoking cigarettes. She has a 34.00 pack-year smoking history. She has never used smokeless tobacco. She reports current alcohol use. She reports that she does not use drugs.   Family History:  The patient's family history includes Breast cancer in her sister, sister, and sister; Cancer in her mother; Cancer (age  of onset: 25) in her sister; Ovarian cancer in her sister.    ROS: All other systems are reviewed and negative. Unless otherwise mentioned in H&P    PHYSICAL EXAM: VS:  There were no vitals taken for this visit. , BMI There is no height or weight on file to calculate BMI. GEN: Well nourished, well developed, in no acute distress HEENT: normal Neck: no JVD, carotid bruits, or masses Cardiac: ***RRR; no murmurs, rubs, or gallops,no edema  Respiratory:  Clear to auscultation bilaterally, normal work of breathing GI: soft, nontender, nondistended, + BS MS: no deformity or atrophy Skin: warm and dry, no rash Neuro:  Strength and sensation are intact Psych: euthymic  mood, full affect   EKG:  EKG {ACTION; IS/IS OPF:[id number]} ordered today. The ekg ordered today demonstrates ***   Recent Labs: 04/10/2019: Magnesium 2.2 08/17/2019: ALT 44; BUN 11; Creatinine, Ser 0.94; Hemoglobin 14.6; Platelets 149; Potassium 4.0; Sodium 137; TSH 3.240    Lipid Panel    Component Value Date/Time   CHOL 207 (H) 07/14/2019 1458   TRIG 274 (H) 07/14/2019 1458   HDL 66 07/14/2019 1458   CHOLHDL 3.1 07/14/2019 1458   LDLCALC 96 07/14/2019 1458      Wt Readings from Last 3 Encounters:  10/01/19 159 lb 3.2 oz (72.2 kg)  09/09/19 161 lb (73 kg)  09/08/19 159 lb 12.8 oz (72.5 kg)      Other studies Reviewed: Additional studies/ records that were reviewed today include: ***. Review of the above records demonstrates: ***   ASSESSMENT AND PLAN:  1.  ***   Current medicines are reviewed at length with the patient today.  I have spent *** dedicated to the care of this patient on the date of this encounter to include pre-visit review of records, assessment, management and diagnostic testing,with shared decision making.  Labs/ tests ordered today include: *** Phill Myron. [place] Pugh, ANP, AACC   11/03/2019 7:23 AM    [place] [address] Office  [phone] Fax [phone]  Notice: This dictation was prepared with Dragon dictation along with smaller phrase technology. Any transcriptional errors that result from this process are unintentional and may not be corrected upon review.

## 2019-11-04 ENCOUNTER — Ambulatory Visit: Payer: 59 | Admitting: Adult Health

## 2019-11-08 ENCOUNTER — Other Ambulatory Visit: Payer: Self-pay | Admitting: Family Medicine

## 2019-11-08 NOTE — Telephone Encounter (Signed)
Requested Prescriptions  Pending Prescriptions Disp Refills  . omeprazole (PRILOSEC) 20 MG capsule [Pharmacy Med Name: OMEPRAZOLE DR 20 MG CAPSULE] 180 capsule 1    Sig: TAKE 1 CAPSULE BY MOUTH TWICE A DAY BEFORE A MEAL     Gastroenterology: Proton Pump Inhibitors Passed - 11/08/2019 10:31 AM      Passed - Valid encounter within last 12 months    Recent Outpatient Visits          3 weeks ago Generalized anxiety disorder   [facility], Lilia Argue, MD   2 months ago DOE (dyspnea on exertion)   [facility], Lilia Argue, MD   3 months ago Generalized anxiety disorder   [facility], Lilia Argue, MD   6 months ago History of acute pyelonephritis   [facility], Lilia Argue, MD   6 months ago History of acute pyelonephritis   [facility], Lilia Argue, MD      Future Appointments            Tomorrow Rutherford Guys, MD Primary [facility] at [place], [facility]

## 2019-11-09 ENCOUNTER — Other Ambulatory Visit: Payer: Self-pay

## 2019-11-09 ENCOUNTER — Telehealth (INDEPENDENT_AMBULATORY_CARE_PROVIDER_SITE_OTHER): Payer: 59 | Admitting: Family Medicine

## 2019-11-09 ENCOUNTER — Encounter: Payer: Self-pay | Admitting: Family Medicine

## 2019-11-09 DIAGNOSIS — G4734 Idiopathic sleep related nonobstructive alveolar hypoventilation: Secondary | ICD-10-CM | POA: Diagnosis not present

## 2019-11-09 DIAGNOSIS — J0100 Acute maxillary sinusitis, unspecified: Secondary | ICD-10-CM

## 2019-11-09 DIAGNOSIS — K219 Gastro-esophageal reflux disease without esophagitis: Secondary | ICD-10-CM | POA: Diagnosis not present

## 2019-11-09 DIAGNOSIS — K439 Ventral hernia without obstruction or gangrene: Secondary | ICD-10-CM

## 2019-11-09 MED ORDER — CLARITHROMYCIN 500 MG PO TABS: 500.0000 mg | ORAL_TABLET | Freq: Two times a day (BID) | ORAL | 0 refills | Status: DC

## 2019-11-09 MED ORDER — OMEPRAZOLE 20 MG PO CPDR: DELAYED_RELEASE_CAPSULE | ORAL | 1 refills | Status: DC

## 2019-11-09 NOTE — Patient Instructions (Signed)
° ° ° °  If you have lab work done today you will be contacted with your lab results within the next 2 weeks.  If you have not heard from us then please contact us. The fastest way to get your results is to register for My Chart. ° ° °IF you received an x-ray today, you will receive an invoice from [facility]. Please contact [facility] at [phone] with questions or concerns regarding your invoice.  ° °IF you received labwork today, you will receive an invoice from [facility]. Please contact [facility] at [phone] with questions or concerns regarding your invoice.  ° °Our billing staff will not be able to assist you with questions regarding bills from these companies. ° °You will be contacted with the lab results as soon as they are available. The fastest way to get your results is to activate your My Chart account. Instructions are located on the last page of this paperwork. If you have not heard from us regarding the results in 2 weeks, please contact this office. °  ° ° ° °

## 2019-11-09 NOTE — Progress Notes (Signed)
Virtual Visit Note  I connected with patient on 11/09/19 at 622pm by phone (due to technical difficulties) and verified that I am speaking with the correct person using two identifiers. Darren Nodal is currently located at home and patient is currently with them during visit. The provider, Rutherford Guys, MD is located in their office at time of visit.  I discussed the limitations, risks, security and privacy concerns of performing an evaluation and management service by telephone and the availability of in person appointments. I also discussed with the patient that there may be a patient responsible charge related to this service. The patient expressed understanding and agreed to proceed.   I provided 42 minutes of non-face-to-face time during this encounter.  Chief Complaint  Patient presents with  . Follow-up    did not go to the cards appt due to vomiting. She is wanting to talk about the vomiting, desperately wanting it to stop. Taking zofran for the nausea  . Hernia    under chest bone, causing lots of pain. Not taking any meds    HPI ? Last OV 3 weeks ago - ordered overnight oxygen  She has been using overnight oxygen, she thinks it is at 0.5L, she has been using every night with humidifier However for past 2 weeks she however she has been having increase nasal congestion and headaches, wheezing/chest tightness - needing to use albuterol This morning vomiting white mucous Breathing ok during the day flonase at bedtime was providing partial relief Has not been using any decongestant Has been using OTC sinus/cold  She has been having worsening reflux - she has not been taking PPI She had to cancel cards fu for echo due to return of vomiting again She continues to feel a painful bulge underneath her sternum 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 Left ventricular ejection fraction, by estimation, is 60 to 65%. The left ventricle has normal function. The left ventricle has no regional wall motion abnormalities. Left ventricular diastolic parameters were normal. 2. Right ventricular systolic function is normal. The right ventricular size is mildly enlarged. Tricuspid regurgitation signal is inadequate for assessing PA pressure. 3. The mitral valve is normal in structure. No evidence of mitral valve regurgitation. 4. The aortic valve was not well visualized. Aortic valve regurgitation is not visualized. No aortic stenosis is present. 5. The inferior vena cava is normal in size with greater than 50% respiratory variability, suggesting right atrial pressure of 3 mmHg.  Stress test April 2021 Interpretation Summary: BP Response to Exercise: ADEQUATE. Chest Pain: none. Arrhythmias: none.  Allergies  Allergen Reactions  . Jasmine Oil Shortness Of Breath and Swelling  . Levaquin [Levofloxacin] Shortness Of Breath    Rash, swelling, itching  . Penicillins Anaphylaxis and Hives    Tolerated ceftriaxone  . Aleve [Naproxen Sodium] Itching  . Benadryl [Diphenhydramine] Other (See Comments)    hallucinations  . Ceftin [Cefuroxime Axetil] Other (See Comments)    unsure  . Depo-Provera [Medroxyprogesterone Acetate] Other (See Comments)    Severe acne  . Doxycycline     Raises blood pressure   . Oxycodone Nausea And Vomiting  . Tramadol Other (See Comments)    Kept her up all night    Prior to Admission medications   Medication Sig Start Date End Date Taking? Authorizing Provider  acetaminophen (TYLENOL) 325  MG tablet Take 650 mg by mouth every 6 (six) hours as needed.    [provider]  albuterol (VENTOLIN HFA) 108 (90 Base) MCG/ACT inhaler Inhale 2 puffs into the lungs every 4 (four) hours as needed for wheezing or shortness of breath. 03/12/19   Rutherford Guys, MD  ALPRAZolam Duanne Moron) 0.5 MG tablet Take 1  tablet (0.5 mg total) by mouth 3 (three) times daily as needed for anxiety. 10/15/19   Rutherford Guys, MD  amLODipine (NORVASC) 5 MG tablet Take 1 tablet (5 mg total) by mouth daily. 07/14/19   Rutherford Guys, MD  aspirin EC 81 MG tablet Take 81 mg by mouth daily.    [provider]  cetirizine (ZYRTEC) 10 MG tablet Take 1 tablet (10 mg total) by mouth daily. 07/19/17   McVey, Gelene Mink, PA-C  citalopram (CELEXA) 20 MG tablet Take 3 tablets (60 mg total) by mouth daily. 07/14/19   Rutherford Guys, MD  eletriptan (RELPAX) 40 MG tablet TAKE 1 TAB BY MOUTH AT ONSET OF MIGRAINE OR HEADACHE AS NEEDED. MAY REPEAT IN 2 HOURS IF PERSISTS Patient taking differently: Take 40 mg by mouth See admin instructions. Take 40mg  at onset of migraine or headache as needed. May repeat in 2 hours if symptoms persist. 03/12/19   Rutherford Guys, MD  Fluticasone-Salmeterol (ADVAIR DISKUS) 500-50 MCG/DOSE AEPB INHALE 1 PUFF INTO THE LUNGS TWICE A DAY 10/03/19   Rutherford Guys, MD  gabapentin (NEURONTIN) 300 MG capsule Take 1 capsule (300 mg total) by mouth 2 (two) times daily. 08/17/19   Rutherford Guys, MD  glycopyrrolate (ROBINUL) 1 MG tablet Take 1 mg by mouth at bedtime. 07/14/19   [provider]  INCRUSE ELLIPTA 62.5 MCG/INH AEPB TAKE 1 PUFF BY MOUTH EVERY DAY 09/24/19   Rutherford Guys, MD  Multiple Vitamin (MULTIVITAMIN WITH MINERALS) TABS tablet Take 1 tablet by mouth daily. 04/12/19   Swayze, Ava, DO  omeprazole (PRILOSEC) 20 MG capsule TAKE 1 CAPSULE BY MOUTH TWICE A DAY BEFORE A MEAL 11/08/19   Rutherford Guys, MD  ondansetron (ZOFRAN) 8 MG tablet TAKE 1 TABLET (8 MG TOTAL) BY MOUTH EVERY 8 (EIGHT) HOURS AS NEEDED FOR NAUSEA OR VOMITING. 07/20/19   Rutherford Guys, MD  prazosin (MINIPRESS) 1 MG capsule TAKE 1 TO 2 CAPSULES (1 TO 2 MG TOTAL) BY MOUTH AT BEDTIME. 07/20/19   Rutherford Guys, MD  promethazine (PHENERGAN) 25 MG tablet TAKE 1 TABLET BY MOUTH EVERY DAY 10/15/19   Rutherford Guys, MD   rOPINIRole (REQUIP) 1 MG tablet TAKE 1 TABLET (1 MG TOTAL) BY MOUTH AT BEDTIME. 10/03/19   Rutherford Guys, MD  thiamine 100 MG tablet Take 1 tablet (100 mg total) by mouth daily. 04/12/19   Swayze, Ava, DO  traZODone (DESYREL) 50 MG tablet TAKE 1 TABLET BY MOUTH EVERYDAY AT BEDTIME 06/02/19   Rutherford Guys, MD  varenicline (CHANTIX STARTING MONTH PAK) 0.5 MG X 11 & 1 MG X 42 tablet Take one 0.5 mg tablet by mouth once daily for 3 days, then increase to one 0.5 mg tablet twice daily for 4 days, then increase to one 1 mg tablet twice daily. 07/14/19   Rutherford Guys, MD  ibuprofen (ADVIL,MOTRIN) 800 MG tablet Take 1 tablet (800 mg total) by mouth every 8 (eight) hours as needed for mild pain. 04/22/18   Rutherford Guys, MD    Past Medical History:  Diagnosis Date  .  Allergy   . Anxiety   . Arthritis    hands  . Asthma   . Carpal tunnel syndrome   . Cataract    start bilateral  . COPD (chronic obstructive pulmonary disease) ([place])   . Depression   . Diverticulitis   . Essential hypertension, benign 07/14/2019  . Hyperlipidemia     Past Surgical History:  Procedure Laterality Date  . ABDOMINAL HYSTERECTOMY  2009  . APPENDECTOMY    . ARTHROSCOPIC REPAIR ACL Left 2011   x 2  . BUNIONECTOMY  2006   both  . CARPAL TUNNEL RELEASE Bilateral 10/28/2013   Procedure: BILATERAL CARPAL TUNNEL RELEASE;  Surgeon: Wynonia Sours, MD;  Location: [facility];  Service: Orthopedics;  Laterality: Bilateral;  . DILATION AND CURETTAGE OF UTERUS    . TUBAL LIGATION  2004    Social History   Tobacco Use  . Smoking status: Current Every Day Smoker    Packs/day: 1.00    Years: 34.00    Pack years: 34.00    Types: Cigarettes  . Smokeless tobacco: Never Used  . Tobacco comment: on Chantix  Substance Use Topics  . Alcohol use: Yes    Comment: everyday    Family History  Problem Relation Age of Onset  . Cancer Mother   . Cancer Sister 44       breast cancer  . Breast cancer  Sister   . Breast cancer Sister   . Ovarian cancer Sister   . Breast cancer Sister   . Colon cancer Neg Hx   . Colon polyps Neg Hx   . Esophageal cancer Neg Hx   . Rectal cancer Neg Hx   . Stomach cancer Neg Hx     ROS Per hpi  Objective  Vitals as reported by the patient: none   ASSESSMENT and PLAN  1. Ventral hernia without obstruction or gangrene History suggestive of possible ventral hernia. [place] to evaluate. Discussed ER precautions.  - US Abdomen Limited; Future  2. Acute non-recurrent maxillary sinusitis rx biaxin which patient has tolerated well in the past  3. Gastroesophageal reflux disease, unspecified whether esophagitis present Restart omeprazole BID. Pending EGD  4. Nocturnal hypoxia Started on nocturnal oxygen, will reach out to [employer] to clarify settings  Other orders - omeprazole (PRILOSEC) 20 MG capsule; TAKE 1 CAPSULE BY MOUTH TWICE A DAY BEFORE A MEAL - clarithromycin (BIAXIN) 500 MG tablet; Take 1 tablet (500 mg total) by mouth 2 (two) times daily.  FOLLOW-UP: 4 weeks   The above assessment and management plan was discussed with the patient. The patient verbalized understanding of and has agreed to the management plan. Patient is aware to call the clinic if symptoms persist or worsen. Patient is aware when to return to the clinic for a follow-up visit. Patient educated on when it is appropriate to go to the emergency department.     Rutherford Guys, MD Primary Care at [facility] [place], [place] [address] Ph.  [phone] Fax [phone]

## 2019-11-11 ENCOUNTER — Ambulatory Visit
Admission: RE | Admit: 2019-11-11 | Discharge: 2019-11-11 | Disposition: A | Discharge status: Home / Self Care | Payer: 59 | Source: Ambulatory Visit | Attending: Family Medicine | Admitting: Family Medicine

## 2019-11-11 DIAGNOSIS — K439 Ventral hernia without obstruction or gangrene: Secondary | ICD-10-CM

## 2019-12-16 ENCOUNTER — Ambulatory Visit (INDEPENDENT_AMBULATORY_CARE_PROVIDER_SITE_OTHER): Payer: 59 | Admitting: Physician Assistant

## 2019-12-16 ENCOUNTER — Encounter: Payer: Self-pay | Admitting: Physician Assistant

## 2019-12-16 VITALS — BP 128/84 | HR 100 | Ht 63.0 in | Wt 160.2 lb

## 2019-12-16 DIAGNOSIS — Z1211 Encounter for screening for malignant neoplasm of colon: Secondary | ICD-10-CM | POA: Diagnosis not present

## 2019-12-16 DIAGNOSIS — R1084 Generalized abdominal pain: Secondary | ICD-10-CM | POA: Diagnosis not present

## 2019-12-16 DIAGNOSIS — K219 Gastro-esophageal reflux disease without esophagitis: Secondary | ICD-10-CM | POA: Diagnosis not present

## 2019-12-16 DIAGNOSIS — R112 Nausea with vomiting, unspecified: Secondary | ICD-10-CM

## 2019-12-16 MED ORDER — ONDANSETRON 4 MG PO TBDP
4.0000 mg | ORAL_TABLET | Freq: Three times a day (TID) | ORAL | 2 refills | Status: DC | PRN
Start: 2019-12-16 — End: 2020-06-13

## 2019-12-16 NOTE — Patient Instructions (Signed)
If you are age 52 or older, your body mass index should be between 23-30. Your Body mass index is 28.39 kg/m. If this is out of the aforementioned range listed, please consider follow up with your Primary Care Provider.  If you are age 23 or younger, your body mass index should be between 19-25. Your Body mass index is 28.39 kg/m. If this is out of the aformentioned range listed, please consider follow up with your Primary Care Provider.   You have been scheduled for a gastric emptying scan at [facility] on 12/25/19 at 7:30 am. Please arrive at least 15 minutes prior to your appointment for registration. Please make certain not to have anything to eat or drink after midnight the night before your test. Hold all stomach medications (ex: Zofran, phenergan, Reglan) 48 hours prior to your test. If you need to reschedule your appointment, please contact radiology scheduling at [phone]. _____________________________________________________________________ A gastric-emptying study measures how long it takes for food to move through your stomach. There are several ways to measure stomach emptying. In the most common test, you eat food that contains a small amount of radioactive material. A scanner that detects the movement of the radioactive material is placed over your abdomen to monitor the rate at which food leaves your stomach. This test normally takes about 4 hours to complete. _____________________________________________________________________  CONTINUE Your Omeprazole 20 mg 1 capsule twice a day START Ondansetron 4 mg 1 tablet every 6-8 hours as needed for nausea  STOP Glycopyrrolate  Try Level 3 of the Gastroparesis diet.  The office will contact you once Dr. Silverio Decamp lets [place] know when we can work you in for your Colonoscopy/Endoscopy at [facility]. We will make your that your directions are available for you on your My Chart at that time.  Follow up pending the results of your  imaging study and your procedure.

## 2019-12-16 NOTE — Progress Notes (Signed)
Subjective:    Patient ID: Vicki [name], female    DOB: 10-12-1965, 54 y.o.   MRN: [id number]  HPI  Vicki [name] is a pleasant 54 year old white female, established with Dr. Silverio Decamp, who comes in today with persistent complaints of nausea and vomiting and to discuss rescheduling endoscopic evaluation. She was last seen in March 2021 with complaints of generalized abdominal discomfort nausea and vomiting as well as constipation.  She was scheduled for EGD and colonoscopy in the [place], however on the day of procedure her resting O2 sat was found to be between 85 and 90 and procedures were canceled. It was advised she have pulmonary and cardiology evaluation.  She has since been evaluated by Dr.Olalare/pulmonary and diagnosed with stage IV COPD.  She is requiring daytime O2 as needed after periods of exertion and using oxygen at nighttime, she believes she is on 2 L.  PFTs were done and also showed a component of restrictive disease.  Recent echo 09/2019 with EF of 60 to 65%. She underwent upper abdominal ultrasound in May 2021 unremarkable status post cholecystectomy and had a limited ultrasound in June 2021 by her PCP to evaluate for ventral hernia and this was negative. Patient has history also of hypertension, degenerative disc disease, restless leg syndrome depression and is status post cholecystectomy. She says she has had nausea and vomiting intermittently over the past 2 years which is now gotten to the point that it is almost daily.  She is taking Zofran once or twice daily and says this definitely helps and if she takes the Zofran then she will not wake up in the middle the night nauseated and have vomiting.  Some days she has queasiness with dry heaves but does not actually vomit and on occasion will have good days without nausea and vomiting.  She denies any cannabis use.  She does complain of early satiety.  She has been avoiding meat recently and also has stopped eating most fresh vegetables  as both seem to make her symptoms worse.  Previously she had been having some diarrhea which had resolved and is having normal bowel movements currently without melena or hematochezia. She does admit to NSAID use intermittently and actually has been taking some ibuprofen for her GI symptoms. Family history is negative for GI disease  Review of Systems Pertinent positive and negative review of systems were noted in the above HPI section.  All other review of systems was otherwise negative.  Outpatient Encounter Medications as of 12/16/2019  Medication Sig   acetaminophen (TYLENOL) 325 MG tablet Take 650 mg by mouth every 6 (six) hours as needed.   albuterol (VENTOLIN HFA) 108 (90 Base) MCG/ACT inhaler Inhale 2 puffs into the lungs every 4 (four) hours as needed for wheezing or shortness of breath.   ALPRAZolam (XANAX) 0.5 MG tablet Take 1 tablet (0.5 mg total) by mouth 3 (three) times daily as needed for anxiety.   amLODipine (NORVASC) 5 MG tablet Take 1 tablet (5 mg total) by mouth daily.   aspirin EC 81 MG tablet Take 81 mg by mouth daily.   cetirizine (ZYRTEC) 10 MG tablet Take 1 tablet (10 mg total) by mouth daily.   citalopram (CELEXA) 20 MG tablet Take 3 tablets (60 mg total) by mouth daily.   eletriptan (RELPAX) 40 MG tablet TAKE 1 TAB BY MOUTH AT ONSET OF MIGRAINE OR HEADACHE AS NEEDED. MAY REPEAT IN 2 HOURS IF PERSISTS (Patient taking differently: Take 40 mg by mouth See admin instructions.  Take 40mg  at onset of migraine or headache as needed. May repeat in 2 hours if symptoms persist.)   Fluticasone-Salmeterol (ADVAIR DISKUS) 500-50 MCG/DOSE AEPB INHALE 1 PUFF INTO THE LUNGS TWICE A DAY   gabapentin (NEURONTIN) 300 MG capsule Take 1 capsule (300 mg total) by mouth 2 (two) times daily.   INCRUSE ELLIPTA 62.5 MCG/INH AEPB TAKE 1 PUFF BY MOUTH EVERY DAY   Multiple Vitamin (MULTIVITAMIN WITH MINERALS) TABS tablet Take 1 tablet by mouth daily.   omeprazole (PRILOSEC) 20 MG capsule  TAKE 1 CAPSULE BY MOUTH TWICE A DAY BEFORE A MEAL   ondansetron (ZOFRAN) 8 MG tablet TAKE 1 TABLET (8 MG TOTAL) BY MOUTH EVERY 8 (EIGHT) HOURS AS NEEDED FOR NAUSEA OR VOMITING.   prazosin (MINIPRESS) 1 MG capsule TAKE 1 TO 2 CAPSULES (1 TO 2 MG TOTAL) BY MOUTH AT BEDTIME.   promethazine (PHENERGAN) 25 MG tablet TAKE 1 TABLET BY MOUTH EVERY DAY   rOPINIRole (REQUIP) 1 MG tablet TAKE 1 TABLET (1 MG TOTAL) BY MOUTH AT BEDTIME.   thiamine 100 MG tablet Take 1 tablet (100 mg total) by mouth daily.   traZODone (DESYREL) 50 MG tablet TAKE 1 TABLET BY MOUTH EVERYDAY AT BEDTIME   varenicline (CHANTIX STARTING MONTH PAK) 0.5 MG X 11 & 1 MG X 42 tablet Take one 0.5 mg tablet by mouth once daily for 3 days, then increase to one 0.5 mg tablet twice daily for 4 days, then increase to one 1 mg tablet twice daily.   [DISCONTINUED] glycopyrrolate (ROBINUL) 1 MG tablet Take 1 mg by mouth at bedtime.   [DISCONTINUED] clarithromycin (BIAXIN) 500 MG tablet Take 1 tablet (500 mg total) by mouth 2 (two) times daily. (Patient not taking: Reported on 12/16/2019)   [DISCONTINUED] ibuprofen (ADVIL,MOTRIN) 800 MG tablet Take 1 tablet (800 mg total) by mouth every 8 (eight) hours as needed for mild pain.   No facility-administered encounter medications on file as of 12/16/2019.   Allergies  Allergen Reactions   Jasmine Oil Shortness Of Breath and Swelling   Levaquin [Levofloxacin] Shortness Of Breath    Rash, swelling, itching   Penicillins Anaphylaxis and Hives    Tolerated ceftriaxone   Aleve [Naproxen Sodium] Itching   Benadryl [Diphenhydramine] Other (See Comments)    hallucinations   Ceftin [Cefuroxime Axetil] Other (See Comments)    unsure   Depo-Provera [Medroxyprogesterone Acetate] Other (See Comments)    Severe acne   Doxycycline     Raises blood pressure    Oxycodone Nausea And Vomiting   Tramadol Other (See Comments)    Kept her up all night   Patient Active Problem List   Diagnosis  Date Noted   Elevated triglycerides with high cholesterol 07/14/2019   RLS (restless legs syndrome) 07/14/2019   Tobacco use disorder 07/14/2019   Essential hypertension, benign 07/14/2019   Generalized anxiety disorder 07/14/2019   CAP (community acquired pneumonia) 04/07/2019   UTI (urinary tract infection) 04/07/2019   SIRS (systemic inflammatory response syndrome) ([place]) 04/07/2019   Suspected COVID-19 virus infection 04/07/2019   Viral gastroenteritis 04/07/2019   Fatigue 11/07/2018   Depression 11/07/2018   Insomnia 11/07/2018   Polypharmacy 07/19/2017   Asthma    Carpal tunnel syndrome    COPD (chronic obstructive pulmonary disease) ([place])    DDD (degenerative disc disease), lumbar 10/22/2016   Social History   Socioeconomic History   Marital status: Married    Spouse name: [name]   Number of children: 3   Years of  education: 12th grade   Highest education level: Not on file  Occupational History   Occupation: Airline pilot: [employer]  Tobacco Use   Smoking status: Current Every Day Smoker    Packs/day: 1.00    Years: 34.00    Pack years: 34.00    Types: Cigarettes   Smokeless tobacco: Never Used   Tobacco comment: on Chantix  Vaping Use   Vaping Use: Never used  Substance and Sexual Activity   Alcohol use: Yes    Comment: everyday   Drug use: No   Sexual activity: Yes  Other Topics Concern   Not on file  Social History Narrative   Lives with her husband.   Their 3 children live independently.   Social Determinants of Health   Financial Resource Strain:    Difficulty of Paying Living Expenses:   Food Insecurity:    Worried About Charity fundraiser in the Last Year:    Arboriculturist in the Last Year:   Transportation Needs:    Film/video editor (Medical):    Lack of Transportation (Non-Medical):   Physical Activity:    Days of Exercise per Week:    Minutes of Exercise per Session:     Stress:    Feeling of Stress :   Social Connections:    Frequency of Communication with Friends and Family:    Frequency of Social Gatherings with Friends and Family:    Attends Religious Services:    Active Member of Clubs or Organizations:    Attends Music therapist:    Marital Status:   Intimate Partner Violence:    Fear of Current or Ex-Partner:    Emotionally Abused:    Physically Abused:    Sexually Abused:     Ms. Kief family history includes Breast cancer in her sister, sister, and sister; Cancer in her mother; Cancer (age of onset: 9) in her sister; Ovarian cancer in her sister.      Objective:    Vitals:   12/16/19 1008  BP: 128/84  Pulse: 100  SpO2: 95%    Physical Exam Well-developed well-nourished WF  in no acute distress.  Height, Weight,160 BMI 28.3  HEENT; nontraumatic normocephalic, EOMI, PE RR LA, sclera anicteric. Oropharynx;not done Neck; supple, no JVD Cardiovascular; regular rate and rhythm with S1-S2, no murmur rub or gallop Pulmonary;  Decreased BS bilaterally Abdomen; soft,  No focal tenderness, nondistended, no palpable mass or hepatosplenomegaly, bowel sounds are active Rectal;not done Skin; benign exam, no jaundice rash or appreciable lesions Extremities; no clubbing cyanosis or edema skin warm and dry Neuro/Psych; alert and oriented x4, grossly nonfocal mood and affect appropriate       Assessment & Plan:   #58 54 year old white female with 2-year history of nausea and vomiting which has progressed to daily symptoms.  Patient has actually been awakened in the middle of the night with nausea and vomiting.  She does complain of early satiety. Patient is status post cholecystectomy Etiology of nausea and vomiting is not clear, rule out chronic gastropathy, chronic peptic ulcer disease, neoplasm, gastroparesis.  #2 colon cancer screening-no prior colonoscopy.  Previous complaints of diarrhea which have  resolved #3 severe COPD-stage IV-now requiring as needed daytime O2 and regular use of nocturnal oxygen.  Also felt to have restrictive component. #4 depression #5.  Degenerative disc disease #6.  Restless leg syndrome #7.  Hypertension  Plan patient will be scheduled for EGD and colonoscopy  with Dr. Silverio Decamp  at the hospital given oxygen use..  Procedures were discussed in detail with the patient including indications risks and benefits and she is agreeable to proceed. She has completed COVID-19 vaccination. We will also schedule for gastric emptying scan.  If all of the above unremarkable then need CT of the abdomen and pelvis. Stop Robinul which she was taking at bedtime Continue omeprazole 20 mg p.o. twice daily Have refilled Zofran, okay to use Zofran 4 mg every 6 to 8 hours scheduled for now.  (Dr. Silverio Decamp -no hospital availability obvious for September.  Please advise on options for hospital case.  Patient related her husband will lose his insurance at the end of September).  Zanita Millman Genia Harold PA-C 12/16/2019   Cc: Rutherford Guys, MD

## 2019-12-25 ENCOUNTER — Other Ambulatory Visit: Payer: Self-pay

## 2019-12-25 ENCOUNTER — Ambulatory Visit (HOSPITAL_COMMUNITY)
Admission: RE | Admit: 2019-12-25 | Discharge: 2019-12-25 | Disposition: A | Discharge status: Home / Self Care | Payer: 59 | Source: Ambulatory Visit | Attending: Physician Assistant | Admitting: Physician Assistant

## 2019-12-25 DIAGNOSIS — Z1211 Encounter for screening for malignant neoplasm of colon: Secondary | ICD-10-CM | POA: Diagnosis present

## 2019-12-25 DIAGNOSIS — K219 Gastro-esophageal reflux disease without esophagitis: Secondary | ICD-10-CM | POA: Diagnosis present

## 2019-12-25 DIAGNOSIS — R1084 Generalized abdominal pain: Secondary | ICD-10-CM

## 2019-12-25 DIAGNOSIS — R112 Nausea with vomiting, unspecified: Secondary | ICD-10-CM

## 2019-12-25 MED ORDER — TECHNETIUM TC 99M SULFUR COLLOID: 2.0900 | Freq: Once | INTRAVENOUS | Status: DC | PRN

## 2019-12-25 MED ORDER — TECHNETIUM TC 99M SULFUR COLLOID
2.0900 | Freq: Once | INTRAVENOUS | Status: AC | PRN
  Administered 2019-12-25: 2.09 via ORAL

## 2019-12-28 ENCOUNTER — Telehealth: Payer: Self-pay | Admitting: Physician Assistant

## 2019-12-29 ENCOUNTER — Other Ambulatory Visit: Payer: Self-pay

## 2019-12-29 DIAGNOSIS — R112 Nausea with vomiting, unspecified: Secondary | ICD-10-CM

## 2019-12-29 DIAGNOSIS — R1084 Generalized abdominal pain: Secondary | ICD-10-CM

## 2019-12-29 NOTE — Telephone Encounter (Signed)
Please check if I can do the procedures on Sep 9th around 12:45/1PM? Thanks

## 2019-12-29 NOTE — Telephone Encounter (Signed)
The patient was seen 12/16/19 for her symptoms. The GES was WNLs. She has modified her diet following the gastroparesis diet level 3. She had a better week last week even though she still has nausea, vomiting and abdominal pain.  Patient is calling because she is still waiting to be scheduled for her EGD/colon at [facility]. Her insurance will end 02/11/20. Not certain when her situation will change.  Do you have any days you can take care of her EGD/colon before 02/11/20? Thanks

## 2019-12-29 NOTE — Telephone Encounter (Signed)
Left a message for the patient. Pre-visit 01/05/20 at 11:00 am EGD/colonoscopy at [facility] on 01/21/20. Arrive at 11:30 am. The nurse visit will set her up for the COVID screening required by [facility] before her procedure. Asked she confirm this appointment by calling me back.

## 2020-01-01 NOTE — Telephone Encounter (Signed)
Confirmed the appointments with the patient.

## 2020-01-05 ENCOUNTER — Ambulatory Visit (AMBULATORY_SURGERY_CENTER): Payer: Self-pay

## 2020-01-05 ENCOUNTER — Other Ambulatory Visit: Payer: Self-pay

## 2020-01-05 VITALS — Ht 63.0 in | Wt 160.0 lb

## 2020-01-05 DIAGNOSIS — R1084 Generalized abdominal pain: Secondary | ICD-10-CM

## 2020-01-05 DIAGNOSIS — Z1211 Encounter for screening for malignant neoplasm of colon: Secondary | ICD-10-CM

## 2020-01-05 DIAGNOSIS — K219 Gastro-esophageal reflux disease without esophagitis: Secondary | ICD-10-CM

## 2020-01-05 DIAGNOSIS — R112 Nausea with vomiting, unspecified: Secondary | ICD-10-CM

## 2020-01-05 MED ORDER — NA SULFATE-K SULFATE-MG SULF 17.5-3.13-1.6 GM/177ML PO SOLN
1.0000 | Freq: Once | ORAL | 0 refills | Status: AC
Start: 2020-01-05 — End: 2020-01-05

## 2020-01-05 NOTE — Progress Notes (Signed)
Patient and patient's husband present at pre visit appt;  No egg or soy allergy known to patient  No issues with past sedation with any surgeries or procedures No intubation problems in the past  No FH of Malignant Hyperthermia No diet pills per patient No home 02 use per patient  No blood thinners per patient  Pt denies issues with constipation  No A fib or A flutter  EMMI video via MyChart  COVID 19 guidelines implemented in PV today with Pt and RN   Coupon given to pt in PV today , Code to Pharmacy  COVID screening scheduled on 01/19/2020 at 8:50 am; patient and patient's husband are aware of appt date/time;  Due to the COVID-19 pandemic we are asking patients to follow these guidelines. Please only bring one care partner. Please be aware that your care partner may wait in the car in the parking lot or if they feel like they will be too hot to wait in the car, they may wait in the lobby on the 4th floor. All care partners are required to wear a mask the entire time (we do not have any that we can provide them), they need to practice social distancing, and we will do a Covid check for all patient's and care partners when you arrive. Also we will check their temperature and your temperature. If the care partner waits in their car they need to stay in the parking lot the entire time and we will call them on their cell phone when the patient is ready for discharge so they can bring the car to the front of the building. Also all patient's will need to wear a mask into building.

## 2020-01-08 ENCOUNTER — Encounter: Payer: Self-pay | Admitting: Gastroenterology

## 2020-01-08 NOTE — Progress Notes (Signed)
Attempted to obtain medical history via telephone, unable to reach at this time. I left a voicemail to return pre surgical testing department's phone call.  

## 2020-01-14 ENCOUNTER — Other Ambulatory Visit: Payer: Self-pay | Admitting: Family Medicine

## 2020-01-14 DIAGNOSIS — G2581 Restless legs syndrome: Secondary | ICD-10-CM

## 2020-01-14 NOTE — Telephone Encounter (Signed)
Requested Prescriptions  Pending Prescriptions Disp Refills  . rOPINIRole (REQUIP) 1 MG tablet [Pharmacy Med Name: ROPINIROLE HCL 1 MG TABLET] 90 tablet 0    Sig: TAKE 1 TABLET (1 MG TOTAL) BY MOUTH AT BEDTIME.     Neurology:  Parkinsonian Agents Passed - 01/14/2020  1:57 PM      Passed - Last BP in normal range    BP Readings from Last 1 Encounters:  12/16/19 128/84         Passed - Valid encounter within last 12 months    Recent Outpatient Visits          2 months ago Ventral hernia without obstruction or gangrene   [facility] Lilia Argue, MD   3 months ago Generalized anxiety disorder   [facility], Lilia Argue, MD   5 months ago DOE (dyspnea on exertion)   [facility], Lilia Argue, MD   6 months ago Generalized anxiety disorder   [facility], Lilia Argue, MD   8 months ago History of acute pyelonephritis   [facility], Lilia Argue, MD             . varenicline (CHANTIX STARTING MONTH PAK) 0.5 MG X 11 & 1 MG X 42 tablet [Pharmacy Med Name: CHANTIX STARTING MONTH BOX] 36 each     Sig: TAKE AS DIRECTED     Psychiatry:  Drug Dependence Therapy Passed - 01/14/2020  1:57 PM      Passed - Valid encounter within last 12 months    Recent Outpatient Visits          2 months ago Ventral hernia without obstruction or gangrene   [facility] Lilia Argue, MD   3 months ago Generalized anxiety disorder   [facility] Lilia Argue, MD   5 months ago DOE (dyspnea on exertion)   [facility] Lilia Argue, MD   6 months ago Generalized anxiety disorder   [facility] Lilia Argue, MD   8 months ago History of acute pyelonephritis   [facility], Lilia Argue, MD

## 2020-01-14 NOTE — Telephone Encounter (Signed)
Requested medication (s) are due for refill today:  No  Requested medication (s) are on the active medication list:  No  Future visit scheduled:  No  Last Refill: 07/2019; no longer on active med list  Notes to clinic: medication was d/c'd by Provider  Requested Prescriptions  Pending Prescriptions Disp Refills   varenicline (CHANTIX STARTING MONTH PAK) 0.5 MG X 11 & 1 MG X 42 tablet [Pharmacy Med Name: CHANTIX STARTING MONTH BOX] 58 each     Sig: TAKE AS DIRECTED      Psychiatry:  Drug Dependence Therapy Passed - 01/14/2020  1:57 PM      Passed - Valid encounter within last 12 months    Recent Outpatient Visits           2 months ago Ventral hernia without obstruction or gangrene   [facility] Lilia Argue, MD   3 months ago Generalized anxiety disorder   [facility] Lilia Argue, MD   5 months ago DOE (dyspnea on exertion)   [facility] Lilia Argue, MD   6 months ago Generalized anxiety disorder   [facility] Lilia Argue, MD   8 months ago History of acute pyelonephritis   [facility] Lilia Argue, MD               Signed Prescriptions Disp Refills   rOPINIRole (REQUIP) 1 MG tablet 90 tablet 0    Sig: TAKE 1 TABLET (1 MG TOTAL) BY MOUTH AT BEDTIME.      Neurology:  Parkinsonian Agents Passed - 01/14/2020  1:57 PM      Passed - Last BP in normal range    BP Readings from Last 1 Encounters:  12/16/19 128/84          Passed - Valid encounter within last 12 months    Recent Outpatient Visits           2 months ago Ventral hernia without obstruction or gangrene   [facility], Lilia Argue, MD   3 months ago Generalized anxiety disorder   [facility] Lilia Argue, MD   5 months ago DOE (dyspnea on exertion)   [facility] Lilia Argue, MD   6 months ago Generalized anxiety disorder   [facility] Lilia Argue, MD   8 months ago  History of acute pyelonephritis   [facility], Lilia Argue, MD

## 2020-01-19 ENCOUNTER — Other Ambulatory Visit: Payer: Self-pay | Admitting: Family Medicine

## 2020-01-19 ENCOUNTER — Telehealth: Payer: Self-pay | Admitting: Gastroenterology

## 2020-01-19 ENCOUNTER — Other Ambulatory Visit (HOSPITAL_COMMUNITY)
Admission: RE | Admit: 2020-01-19 | Discharge: 2020-01-19 | Disposition: A | Discharge status: Home / Self Care | Payer: 59 | Source: Ambulatory Visit | Attending: Gastroenterology | Admitting: Gastroenterology

## 2020-01-19 DIAGNOSIS — Z20822 Contact with and (suspected) exposure to covid-19: Secondary | ICD-10-CM | POA: Insufficient documentation

## 2020-01-19 DIAGNOSIS — Z01812 Encounter for preprocedural laboratory examination: Secondary | ICD-10-CM | POA: Insufficient documentation

## 2020-01-19 DIAGNOSIS — F32A Depression, unspecified: Secondary | ICD-10-CM

## 2020-01-19 LAB — SARS CORONAVIRUS 2 (TAT 6-24 HRS): SARS Coronavirus 2: NEGATIVE

## 2020-01-19 NOTE — Telephone Encounter (Signed)
Explained to pt that this is not Dr Jillyn Hidden favorite prep and that she will need to [place]- she verbalized understanding- I sent via My Chart Dr Jillyn Hidden Miralax prep for [facility] case Thursday  9-9= pt to call with questions - we did go over prep over the phone this morning

## 2020-01-21 ENCOUNTER — Other Ambulatory Visit: Payer: Self-pay

## 2020-01-21 ENCOUNTER — Encounter (HOSPITAL_COMMUNITY): Payer: Self-pay | Admitting: Gastroenterology

## 2020-01-21 ENCOUNTER — Ambulatory Visit (HOSPITAL_COMMUNITY)
Admission: RE | Admit: 2020-01-21 | Discharge: 2020-01-21 | Disposition: A | Discharge status: Home / Self Care | Payer: 59 | Attending: Gastroenterology | Admitting: Gastroenterology

## 2020-01-21 ENCOUNTER — Encounter (HOSPITAL_COMMUNITY)
Admission: RE | Disposition: A | Discharge status: Home / Self Care | Payer: Self-pay | Source: Home / Self Care | Attending: Gastroenterology

## 2020-01-21 ENCOUNTER — Ambulatory Visit (HOSPITAL_COMMUNITY): Payer: 59 | Admitting: Anesthesiology

## 2020-01-21 DIAGNOSIS — R1115 Cyclical vomiting syndrome unrelated to migraine: Secondary | ICD-10-CM | POA: Insufficient documentation

## 2020-01-21 DIAGNOSIS — Z1211 Encounter for screening for malignant neoplasm of colon: Secondary | ICD-10-CM

## 2020-01-21 DIAGNOSIS — Z9981 Dependence on supplemental oxygen: Secondary | ICD-10-CM | POA: Insufficient documentation

## 2020-01-21 DIAGNOSIS — K319 Disease of stomach and duodenum, unspecified: Secondary | ICD-10-CM | POA: Insufficient documentation

## 2020-01-21 DIAGNOSIS — R1084 Generalized abdominal pain: Secondary | ICD-10-CM

## 2020-01-21 DIAGNOSIS — K629 Disease of anus and rectum, unspecified: Secondary | ICD-10-CM

## 2020-01-21 DIAGNOSIS — K2100 Gastro-esophageal reflux disease with esophagitis, without bleeding: Secondary | ICD-10-CM | POA: Diagnosis not present

## 2020-01-21 DIAGNOSIS — F17210 Nicotine dependence, cigarettes, uncomplicated: Secondary | ICD-10-CM | POA: Insufficient documentation

## 2020-01-21 DIAGNOSIS — R0902 Hypoxemia: Secondary | ICD-10-CM | POA: Insufficient documentation

## 2020-01-21 DIAGNOSIS — F419 Anxiety disorder, unspecified: Secondary | ICD-10-CM | POA: Diagnosis not present

## 2020-01-21 DIAGNOSIS — D123 Benign neoplasm of transverse colon: Secondary | ICD-10-CM | POA: Diagnosis not present

## 2020-01-21 DIAGNOSIS — Z7982 Long term (current) use of aspirin: Secondary | ICD-10-CM | POA: Diagnosis not present

## 2020-01-21 DIAGNOSIS — E785 Hyperlipidemia, unspecified: Secondary | ICD-10-CM | POA: Insufficient documentation

## 2020-01-21 DIAGNOSIS — J439 Emphysema, unspecified: Secondary | ICD-10-CM | POA: Insufficient documentation

## 2020-01-21 DIAGNOSIS — K635 Polyp of colon: Secondary | ICD-10-CM | POA: Diagnosis not present

## 2020-01-21 DIAGNOSIS — K6289 Other specified diseases of anus and rectum: Secondary | ICD-10-CM | POA: Diagnosis not present

## 2020-01-21 DIAGNOSIS — I10 Essential (primary) hypertension: Secondary | ICD-10-CM | POA: Diagnosis not present

## 2020-01-21 DIAGNOSIS — Z79899 Other long term (current) drug therapy: Secondary | ICD-10-CM | POA: Insufficient documentation

## 2020-01-21 DIAGNOSIS — Z7951 Long term (current) use of inhaled steroids: Secondary | ICD-10-CM | POA: Diagnosis not present

## 2020-01-21 DIAGNOSIS — K2970 Gastritis, unspecified, without bleeding: Secondary | ICD-10-CM

## 2020-01-21 DIAGNOSIS — K5730 Diverticulosis of large intestine without perforation or abscess without bleeding: Secondary | ICD-10-CM | POA: Diagnosis not present

## 2020-01-21 DIAGNOSIS — J302 Other seasonal allergic rhinitis: Secondary | ICD-10-CM | POA: Insufficient documentation

## 2020-01-21 DIAGNOSIS — F329 Major depressive disorder, single episode, unspecified: Secondary | ICD-10-CM | POA: Insufficient documentation

## 2020-01-21 DIAGNOSIS — K648 Other hemorrhoids: Secondary | ICD-10-CM | POA: Diagnosis not present

## 2020-01-21 DIAGNOSIS — R112 Nausea with vomiting, unspecified: Secondary | ICD-10-CM

## 2020-01-21 HISTORY — PX: COLONOSCOPY WITH PROPOFOL: SHX5780

## 2020-01-21 HISTORY — PX: BIOPSY: SHX5522

## 2020-01-21 HISTORY — PX: ESOPHAGOGASTRODUODENOSCOPY (EGD) WITH PROPOFOL: SHX5813

## 2020-01-21 HISTORY — PX: POLYPECTOMY: SHX5525

## 2020-01-21 SURGERY — ESOPHAGOGASTRODUODENOSCOPY (EGD) WITH PROPOFOL
Anesthesia: Monitor Anesthesia Care

## 2020-01-21 MED ORDER — PROPOFOL 10 MG/ML IV BOLUS
INTRAVENOUS | Status: DC | PRN
  Administered 2020-01-21: 40 mg via INTRAVENOUS

## 2020-01-21 MED ORDER — LIDOCAINE 2% (20 MG/ML) 5 ML SYRINGE
INTRAMUSCULAR | Status: DC | PRN
  Administered 2020-01-21: 60 mg via INTRAVENOUS

## 2020-01-21 MED ORDER — LACTATED RINGERS IV SOLN: INTRAVENOUS | Status: DC

## 2020-01-21 MED ORDER — SODIUM CHLORIDE 0.9 % IV SOLN: INTRAVENOUS | Status: DC

## 2020-01-21 MED ORDER — LACTATED RINGERS IV SOLN: INTRAVENOUS | Status: DC | PRN

## 2020-01-21 MED ORDER — PROPOFOL 500 MG/50ML IV EMUL
INTRAVENOUS | Status: DC | PRN
  Administered 2020-01-21: 150 ug/kg/min via INTRAVENOUS

## 2020-01-21 MED ORDER — PHENYLEPHRINE 40 MCG/ML (10ML) SYRINGE FOR IV PUSH (FOR BLOOD PRESSURE SUPPORT)
PREFILLED_SYRINGE | INTRAVENOUS | Status: DC | PRN
  Administered 2020-01-21 (×11): 80 ug via INTRAVENOUS

## 2020-01-21 SURGICAL SUPPLY — 25 items

## 2020-01-21 NOTE — Op Note (Signed)
[facility] Patient Name: Vicki [name] Procedure Date: 01/21/2020 MRN: [id number] Attending MD: Mauri Pole , MD Date of Birth: 04/14/1966 CSN: [id number] Age: 54 Admit Type: Outpatient Procedure:                Upper GI endoscopy Indications:              Persistent vomiting of unknown cause Providers:                Mauri Pole, MD, Carmie End, RN, Tyna Jaksch Technician, Caryl Pina CRNA Referring MD:              Medicines:                Monitored Anesthesia Care Complications:            No immediate complications. Estimated Blood Loss:     Estimated blood loss was minimal. Procedure:                Pre-Anesthesia Assessment:                           - Prior to the procedure, a History and Physical                            was performed, and patient medications and                            allergies were reviewed. The patient's tolerance of                            previous anesthesia was also reviewed. The risks                            and benefits of the procedure and the sedation                            options and risks were discussed with the patient.                            All questions were answered, and informed consent                            was obtained. Prior Anticoagulants: The patient has                            taken no previous anticoagulant or antiplatelet                            agents. ASA Grade Assessment: III - A patient with                            severe systemic disease. After reviewing the risks  and benefits, the patient was deemed in                            satisfactory condition to undergo the procedure.                           After obtaining informed consent, the endoscope was                            passed under direct vision. Throughout the                            procedure, the patient's blood pressure, pulse,  and                            oxygen saturations were monitored continuously. The                            GIF-H190 ([id number]) Olympus gastroscope was                            introduced through the mouth, and advanced to the                            second part of duodenum. The upper GI endoscopy was                            accomplished without difficulty. The patient                            tolerated the procedure well. Scope In: Scope Out: Findings:      LA Grade B (one or more mucosal breaks greater than 5 mm, not extending       between the tops of two mucosal folds) esophagitis with no bleeding was       found 34 to 36 cm from the incisors.      The Z-line was regular and was found 36 cm from the incisors.      The gastroesophageal flap valve was visualized endoscopically and       classified as Hill Grade II (fold present, opens with respiration).      Patchy mild inflammation characterized by congestion (edema), erythema       and friability was found in the entire examined stomach. Biopsies were       taken with a cold forceps for Helicobacter pylori testing.      The examined duodenum was normal. Impression:               - LA Grade B reflux esophagitis with no bleeding.                           - Z-line regular, 36 cm from the incisors.                           - Gastroesophageal flap valve classified as Hill  Grade II (fold present, opens with respiration).                           - Gastritis. Biopsied.                           - Normal examined duodenum. Moderate Sedation:      Not Applicable - Patient had care per Anesthesia. Recommendation:           - Patient has a contact number available for                            emergencies. The signs and symptoms of potential                            delayed complications were discussed with the                            patient. Return to normal activities tomorrow.                             Written discharge instructions were provided to the                            patient.                           - Resume previous diet.                           - Continue present medications.                           - Await pathology results.                           - Follow an antireflux regimen indefinitely.                           - Use Prilosec (omeprazole) 20 mg PO daily. Procedure Code(s):        --- Professional ---                           [phone], Esophagogastroduodenoscopy, flexible,                            transoral; with biopsy, single or multiple Diagnosis Code(s):        --- Professional ---                           K21.00, Gastro-esophageal reflux disease with                            esophagitis, without bleeding                           K29.70, Gastritis, unspecified, without bleeding  W96.04, Cyclical vomiting syndrome unrelated to                            migraine CPT copyright 2019 American Medical Association. All rights reserved. The codes documented in this report are preliminary and upon coder review may  be revised to meet current compliance requirements. Mauri Pole, MD 01/21/2020 2:03:40 PM This report has been signed electronically. Number of Addenda: 0

## 2020-01-21 NOTE — Anesthesia Preprocedure Evaluation (Addendum)
Anesthesia Evaluation  Patient identified by MRN, date of birth, ID band Patient awake    Reviewed: Allergy & Precautions, H&P , NPO status , Patient's Chart, lab work & pertinent test results  Airway Mallampati: I  TM Distance: >3 FB Neck ROM: Full    Dental  (+) Teeth Intact, Dental Advisory Given   Pulmonary asthma , pneumonia, COPD,  COPD inhaler, Current Smoker,    breath sounds clear to auscultation       Cardiovascular hypertension, Pt. on medications  Rhythm:Regular Rate:Normal     Neuro/Psych PSYCHIATRIC DISORDERS Anxiety Depression  Neuromuscular disease    GI/Hepatic   Endo/Other    Renal/GU      Musculoskeletal  (+) Arthritis ,   Abdominal   Peds  Hematology   Anesthesia Other Findings   Reproductive/Obstetrics                            Anesthesia Physical  Anesthesia Plan  ASA: III  Anesthesia Plan: MAC   Post-op Pain Management:    Induction: Intravenous  PONV Risk Score and Plan: 1 and Propofol infusion, Treatment may vary due to age or medical condition and TIVA  Airway Management Planned: Natural Airway  Additional Equipment:   Intra-op Plan:   Post-operative Plan:   Informed Consent: I have reviewed the patients History and Physical, chart, labs and discussed the procedure including the risks, benefits and alternatives for the proposed anesthesia with the patient or authorized representative who has indicated his/her understanding and acceptance.     Dental advisory given  Plan Discussed with: CRNA  Anesthesia Plan Comments:       Anesthesia Quick Evaluation

## 2020-01-21 NOTE — H&P (Signed)
[facility] History and Physical   Primary Care Physician:  Rutherford Guys, MD   Reason for Procedure:  Intractable nausea and vomiting, GERD, colorectal cancer screening  Plan:    EGD and colonoscopy with possible intervention     HPI: Vicki [name] is a 54 y.o. female here for EGD and colonoscopy for further evaluation of intractable nausea and vomiting, persistent GERD symptoms and for colorectal cancer screening.  The risks and benefits as well as alternatives of endoscopic procedure(s) have been discussed and reviewed. All questions answered. The patient agrees to proceed.    Past Medical History:  Diagnosis Date  . Allergy    seasonal allergies  . Anxiety    on meds  . Arthritis    hands  . Asthma    childhood-current  . Carpal tunnel syndrome    had bilateral sx to tx  . Cataract    bilateral--no sx yet  . COPD (chronic obstructive pulmonary disease) ([place])   . Depression    on meds  . Diverticulitis   . Emphysema of lung ([place])    uses MDI  . Essential hypertension, benign 07/14/2019   on meds  . Hyperlipidemia    on meds  . Oxygen deficiency    at night for sleep    Past Surgical History:  Procedure Laterality Date  . ABDOMINAL HYSTERECTOMY  2009  . APPENDECTOMY    . ARTHROSCOPIC REPAIR ACL Left 2011/2014   x 2  . BUNIONECTOMY  2006   both  . CARPAL TUNNEL RELEASE Bilateral 10/28/2013   Procedure: BILATERAL CARPAL TUNNEL RELEASE;  Surgeon: Wynonia Sours, MD;  Location: [facility];  Service: Orthopedics;  Laterality: Bilateral;  . CHOLECYSTECTOMY  1994  . DILATION AND CURETTAGE OF UTERUS    . TUBAL LIGATION  2004    Prior to Admission medications   Medication Sig Start Date End Date Taking? Authorizing Provider  acetaminophen (TYLENOL) 500 MG tablet Take 1,000 mg by mouth every 6 (six) hours as needed for moderate pain.   Yes [provider]  albuterol (VENTOLIN HFA) 108 (90 Base) MCG/ACT inhaler Inhale  2 puffs into the lungs every 4 (four) hours as needed for wheezing or shortness of breath. Patient taking differently: Inhale 2 puffs into the lungs 2 (two) times daily.  03/12/19  Yes Rutherford Guys, MD  ALPRAZolam Duanne Moron) 0.5 MG tablet Take 1 tablet (0.5 mg total) by mouth 3 (three) times daily as needed for anxiety. Patient taking differently: Take 0.5 mg by mouth 2 (two) times daily.  10/15/19  Yes Rutherford Guys, MD  amLODipine (NORVASC) 5 MG tablet Take 1 tablet (5 mg total) by mouth daily. 07/14/19  Yes Rutherford Guys, MD  aspirin EC 81 MG tablet Take 81 mg by mouth daily.   Yes [provider]  Bismuth Subsalicylate (PEPTO-BISMOL) 262 MG TABS Take 524 mg by mouth daily as needed (indigestion).   Yes [provider]  cetirizine (ZYRTEC) 10 MG tablet Take 1 tablet (10 mg total) by mouth daily. 07/19/17  Yes McVey, Gelene Mink, PA-C  citalopram (CELEXA) 20 MG tablet TAKE 3 TABLETS BY MOUTH EVERY DAY 01/19/20  Yes Rutherford Guys, MD  eletriptan (RELPAX) 40 MG tablet TAKE 1 TAB BY MOUTH AT ONSET OF MIGRAINE OR HEADACHE AS NEEDED. MAY REPEAT IN 2 HOURS IF PERSISTS Patient taking differently: Take 40 mg by mouth See admin instructions. Take 40mg  at onset of migraine or headache as needed. May  repeat in 2 hours if symptoms persist. 03/12/19  Yes Rutherford Guys, MD  Fluticasone-Salmeterol (ADVAIR DISKUS) 500-50 MCG/DOSE AEPB INHALE 1 PUFF INTO THE LUNGS TWICE A DAY Patient taking differently: Inhale 1 puff into the lungs 2 (two) times daily.  10/03/19  Yes Rutherford Guys, MD  gabapentin (NEURONTIN) 300 MG capsule Take 1 capsule (300 mg total) by mouth 2 (two) times daily. 08/17/19  Yes Rutherford Guys, MD  INCRUSE ELLIPTA 62.5 MCG/INH AEPB TAKE 1 PUFF BY MOUTH EVERY DAY Patient taking differently: Inhale 1 puff into the lungs daily.  09/24/19  Yes Rutherford Guys, MD  Menthol, Topical Analgesic, (BIOFREEZE EX) Apply 1 application topically daily as needed (back pain).   Yes  [provider]  Multiple Vitamin (MULTIVITAMIN WITH MINERALS) TABS tablet Take 1 tablet by mouth daily. 04/12/19  Yes Swayze, Ava, DO  omeprazole (PRILOSEC) 20 MG capsule TAKE 1 CAPSULE BY MOUTH TWICE A DAY BEFORE A MEAL Patient taking differently: Take 20 mg by mouth 2 (two) times daily.  11/09/19  Yes Rutherford Guys, MD  ondansetron (ZOFRAN) 8 MG tablet Take 8 mg by mouth every 8 (eight) hours as needed for nausea or vomiting.   Yes [provider]  prazosin (MINIPRESS) 1 MG capsule TAKE 1 TO 2 CAPSULES (1 TO 2 MG TOTAL) BY MOUTH AT BEDTIME. Patient taking differently: Take 2 mg by mouth at bedtime.  07/20/19  Yes Rutherford Guys, MD  promethazine (PHENERGAN) 25 MG tablet TAKE 1 TABLET BY MOUTH EVERY DAY Patient taking differently: Take 25 mg by mouth daily.  10/15/19  Yes Rutherford Guys, MD  rOPINIRole (REQUIP) 1 MG tablet TAKE 1 TABLET (1 MG TOTAL) BY MOUTH AT BEDTIME. 01/14/20  Yes Rutherford Guys, MD  Tetrahydrozoline HCl (VISINE OP) Place 1 drop into both eyes daily as needed (dry eyes).   Yes [provider]  thiamine 100 MG tablet Take 1 tablet (100 mg total) by mouth daily. 04/12/19  Yes Swayze, Ava, DO  traZODone (DESYREL) 50 MG tablet TAKE 1 TABLET BY MOUTH EVERYDAY AT BEDTIME Patient taking differently: Take 50 mg by mouth at bedtime.  06/02/19  Yes Rutherford Guys, MD  ondansetron (ZOFRAN ODT) 4 MG disintegrating tablet Take 1 tablet (4 mg total) by mouth every 8 (eight) hours as needed for nausea or vomiting. Patient not taking: Reported on 01/14/2020 12/16/19   Esterwood, Amy S, PA-C  ibuprofen (ADVIL,MOTRIN) 800 MG tablet Take 1 tablet (800 mg total) by mouth every 8 (eight) hours as needed for mild pain. 04/22/18   Rutherford Guys, MD    Current Facility-Administered Medications  Medication Dose Route Frequency Provider Last Rate Last Admin  . 0.9 %  sodium chloride infusion   Intravenous Continuous Indra Wolters V, MD      . lactated ringers  infusion   Intravenous Continuous Dawnielle Christiana, Venia Minks, MD       Facility-Administered Medications Ordered in Other Encounters  Medication Dose Route Frequency Provider Last Rate Last Admin  . lactated ringers infusion   Intravenous Continuous PRN Mitzie Na, CRNA   New Bag at 01/21/20 1246    Allergies as of 12/29/2019 - Review Complete 12/16/2019  Allergen Reaction Noted  . Jasmine oil Shortness Of Breath and Swelling 02/12/2013  . Levaquin [levofloxacin] Shortness Of Breath 04/09/2019  . Penicillins Anaphylaxis and Hives 02/12/2013  . Aleve [naproxen sodium] Itching 02/12/2013  . Benadryl [diphenhydramine] Other (See Comments) 02/12/2013  . Ceftin [cefuroxime axetil]  Other (See Comments) 02/12/2013  . Depo-provera [medroxyprogesterone acetate] Other (See Comments) 02/12/2013  . Doxycycline  01/14/2018  . Oxycodone Nausea And Vomiting 02/12/2013  . Tramadol Other (See Comments) 02/12/2013    Family History  Problem Relation Age of Onset  . Brain cancer Mother 99  . Lung cancer Mother 58  . Breast cancer Sister 23  . Ovarian cancer Sister 24  . Colon cancer Neg Hx   . Colon polyps Neg Hx   . Esophageal cancer Neg Hx   . Rectal cancer Neg Hx   . Stomach cancer Neg Hx     Social History   Socioeconomic History  . Marital status: Married    Spouse name: [name]  . Number of children: 3  . Years of education: 12th grade  . Highest education level: Not on file  Occupational History  . Occupation: Airline pilot: [employer]  Tobacco Use  . Smoking status: Current Every Day Smoker    Packs/day: 1.00    Years: 34.00    Pack years: 34.00    Types: Cigarettes  . Smokeless tobacco: Never Used  . Tobacco comment: on Chantix  Vaping Use  . Vaping Use: Never used  Substance and Sexual Activity  . Alcohol use: Yes    Comment: everyday  . Drug use: No  . Sexual activity: Yes  Other Topics Concern  . Not on file  Social History Narrative   Lives with  her husband.   Their 3 children live independently.   Social Determinants of Health   Financial Resource Strain:   . Difficulty of Paying Living Expenses: Not on file  Food Insecurity:   . Worried About Charity fundraiser in the Last Year: Not on file  . Ran Out of Food in the Last Year: Not on file  Transportation Needs:   . Lack of Transportation (Medical): Not on file  . Lack of Transportation (Non-Medical): Not on file  Physical Activity:   . Days of Exercise per Week: Not on file  . Minutes of Exercise per Session: Not on file  Stress:   . Feeling of Stress : Not on file  Social Connections:   . Frequency of Communication with Friends and Family: Not on file  . Frequency of Social Gatherings with Friends and Family: Not on file  . Attends Religious Services: Not on file  . Active Member of Clubs or Organizations: Not on file  . Attends Archivist Meetings: Not on file  . Marital Status: Not on file  Intimate Partner Violence:   . Fear of Current or Ex-Partner: Not on file  . Emotionally Abused: Not on file  . Physically Abused: Not on file  . Sexually Abused: Not on file    Review of Systems: All other review of systems negative except as mentioned in the HPI.  Physical Exam: Vital signs in last 24 hours: Temp:  [98.3 F (36.8 C)] 98.3 F (36.8 C) (09/09 1234) Pulse Rate:  [111] 111 (09/09 1234) Resp:  [28] 28 (09/09 1234) BP: (94)/(63) 94/63 (09/09 1234) SpO2:  [92 %] 92 % (09/09 1234) Weight:  [72.6 kg] 72.6 kg (09/09 1258)   General:   Alert,  Well-developed, well-nourished, pleasant and cooperative in NAD Lungs:  Clear throughout to auscultation.   Heart:  Regular rate and rhythm; no murmurs, clicks, rubs,  or gallops. Abdomen:  Soft, nontender and nondistended. Normal bowel sounds.   Neuro/Psych:  Alert and cooperative. Normal  mood and affect. A and O x 3   K. Denzil Magnuson , MD [phone]

## 2020-01-21 NOTE — Anesthesia Postprocedure Evaluation (Signed)
Anesthesia Post Note  Patient: Vicki [name]  Procedure(s) Performed: ESOPHAGOGASTRODUODENOSCOPY (EGD) WITH PROPOFOL (N/A ) COLONOSCOPY WITH PROPOFOL (N/A ) BIOPSY POLYPECTOMY     Patient location during evaluation: PACU Anesthesia Type: MAC Level of consciousness: awake and alert Pain management: pain level controlled Vital Signs Assessment: post-procedure vital signs reviewed and stable Respiratory status: spontaneous breathing, nonlabored ventilation, respiratory function stable and patient connected to nasal cannula oxygen Cardiovascular status: stable and blood pressure returned to baseline Postop Assessment: no apparent nausea or vomiting Anesthetic complications: no   No complications documented.  Last Vitals:  Vitals:   01/21/20 1410 01/21/20 1420  BP: 95/62 103/70  Pulse: (!) 104 (!) 102  Resp: 15 (!) 24  Temp:    SpO2: 94% 92%    Last Pain:  Vitals:   01/21/20 1420  TempSrc:   PainSc: 0-No pain                 Barnet Glasgow

## 2020-01-21 NOTE — Op Note (Signed)
[facility] Patient Name: Vicki [name] Procedure Date: 01/21/2020 MRN: [id number] Attending MD: Mauri Pole , MD Date of Birth: [birth year] CSN: [id number] Age: 54 Admit Type: Outpatient Procedure:                Colonoscopy Indications:              Screening for colorectal malignant neoplasm Providers:                Mauri Pole, MD, Carmie End, RN, Tyna Jaksch Technician, Caryl Pina CRNA Referring MD:              Medicines:                Monitored Anesthesia Care Complications:            No immediate complications. Estimated Blood Loss:     Estimated blood loss was minimal. Procedure:                Pre-Anesthesia Assessment:                           - Prior to the procedure, a History and Physical                            was performed, and patient medications and                            allergies were reviewed. The patient's tolerance of                            previous anesthesia was also reviewed. The risks                            and benefits of the procedure and the sedation                            options and risks were discussed with the patient.                            All questions were answered, and informed consent                            was obtained. Prior Anticoagulants: The patient has                            taken no previous anticoagulant or antiplatelet                            agents. ASA Grade Assessment: III - A patient with                            severe systemic disease. After reviewing the risks  and benefits, the patient was deemed in                            satisfactory condition to undergo the procedure.                           After obtaining informed consent, the colonoscope                            was passed under direct vision. Throughout the                            procedure, the patient's blood pressure,  pulse, and                            oxygen saturations were monitored continuously. The                            PCF-[id number] ([id number]) Olympus pediatric colonscope                            was introduced through the anus and advanced to the                            the cecum, identified by appendiceal orifice and                            ileocecal valve. The colonoscopy was performed                            without difficulty. The patient tolerated the                            procedure well. The quality of the bowel                            preparation was good. The ileocecal valve,                            appendiceal orifice, and rectum were photographed. Scope In: 1:26:10 PM Scope Out: 1:46:54 PM Scope Withdrawal Time: 0 hours 11 minutes 57 seconds  Total Procedure Duration: 0 hours 20 minutes 44 seconds  Findings:      The perianal and digital rectal examinations were normal.      A 8 mm polyp was found in the transverse colon. The polyp was sessile.       The polyp was removed with a cold snare. Resection and retrieval were       complete.      Scattered small and large-mouthed diverticula were found in the sigmoid       colon, descending colon, transverse colon and ascending colon.      A patchy area of mildly congested and furrowed mucosa was found in the       rectum. Biopsies were taken with a cold forceps for histology.      Non-bleeding internal hemorrhoids were  found during retroflexion. The       hemorrhoids were small. Impression:               - One 8 mm polyp in the transverse colon, removed                            with a cold snare. Resected and retrieved.                           - Mild diverticulosis in the sigmoid colon, in the                            descending colon, in the transverse colon and in                            the ascending colon.                           - Congested and furrowed mucosa in the rectum.                             Biopsied.                           - Non-bleeding internal hemorrhoids. Moderate Sedation:      Not Applicable - Patient had care per Anesthesia. Recommendation:           - Patient has a contact number available for                            emergencies. The signs and symptoms of potential                            delayed complications were discussed with the                            patient. Return to normal activities tomorrow.                            Written discharge instructions were provided to the                            patient.                           - Resume previous diet.                           - Continue present medications.                           - Await pathology results.                           - Repeat colonoscopy in 5-10 years for surveillance. Procedure Code(s):        --- Professional ---  45385, Colonoscopy, flexible; with removal of                            tumor(s), polyp(s), or other lesion(s) by snare                            technique                           45380, 59, Colonoscopy, flexible; with biopsy,                            single or multiple Diagnosis Code(s):        --- Professional ---                           Z12.11, Encounter for screening for malignant                            neoplasm of colon                           K63.5, Polyp of colon                           K64.8, Other hemorrhoids                           K62.89, Other specified diseases of anus and rectum                           K57.30, Diverticulosis of large intestine without                            perforation or abscess without bleeding CPT copyright 2019 American Medical Association. All rights reserved. The codes documented in this report are preliminary and upon coder review may  be revised to meet current compliance requirements. Mauri Pole, MD 01/21/2020 2:23:54 PM This report has been signed  electronically. Number of Addenda: 0

## 2020-01-21 NOTE — Discharge Instructions (Signed)
YOU HAD AN ENDOSCOPIC PROCEDURE TODAY: Refer to the procedure report and other information in the discharge instructions given to you for any specific questions about what was found during the examination. If this information does not answer your questions, please call [facility] office at [phone] to clarify.  ° °YOU SHOULD EXPECT: Some feelings of bloating in the abdomen. Passage of more gas than usual. Walking can help get rid of the air that was put into your GI tract during the procedure and reduce the bloating. If you had a lower endoscopy (such as a colonoscopy or flexible sigmoidoscopy) you may notice spotting of blood in your stool or on the toilet paper. Some abdominal soreness may be present for a day or two, also. ° °DIET: Your first meal following the procedure should be a light meal and then it is ok to progress to your normal diet. A half-sandwich or bowl of soup is an example of a good first meal. Heavy or fried foods are harder to digest and may make you feel nauseous or bloated. Drink plenty of fluids but you should avoid alcoholic beverages for 24 hours. If you had a esophageal dilation, please see attached instructions for diet.   ° °ACTIVITY: Your care partner should take you home directly after the procedure. You should plan to take it easy, moving slowly for the rest of the day. You can resume normal activity the day after the procedure however YOU SHOULD NOT DRIVE, use power tools, machinery or perform tasks that involve climbing or major physical exertion for 24 hours (because of the sedation medicines used during the test).  ° °SYMPTOMS TO REPORT IMMEDIATELY: °A gastroenterologist can be reached at any hour. Please call [phone]  for any of the following symptoms:  °Following lower endoscopy (colonoscopy, flexible sigmoidoscopy) °Excessive amounts of blood in the stool  °Significant tenderness, worsening of abdominal pains  °Swelling of the abdomen that is new, acute  °Fever of 100° or  higher  °Following upper endoscopy (EGD, EUS, ERCP, esophageal dilation) °Vomiting of blood or coffee ground material  °New, significant abdominal pain  °New, significant chest pain or pain under the shoulder blades  °Painful or persistently difficult swallowing  °New shortness of breath  °Black, tarry-looking or red, bloody stools ° °FOLLOW UP:  °If any biopsies were taken you will be contacted by phone or by letter within the next 1-3 weeks. Call [phone]  if you have not heard about the biopsies in 3 weeks.  °Please also call with any specific questions about appointments or follow up tests. ° °

## 2020-01-21 NOTE — Transfer of Care (Signed)
Immediate Anesthesia Transfer of Care Note  Patient: Vicki [name]  Procedure(s) Performed: ESOPHAGOGASTRODUODENOSCOPY (EGD) WITH PROPOFOL (N/A ) COLONOSCOPY WITH PROPOFOL (N/A ) BIOPSY POLYPECTOMY  Patient Location: Endoscopy Unit  Anesthesia Type:MAC  Level of Consciousness: drowsy and patient cooperative  Airway & Oxygen Therapy: Patient Spontanous Breathing and Patient connected to face mask oxygen  Post-op Assessment: Report given to RN, Post -op Vital signs reviewed and stable and Patient moving all extremities  Post vital signs: Reviewed and stable  Last Vitals:  Vitals Value Taken Time  BP    Temp    Pulse 105 01/21/20 1352  Resp 28 01/21/20 1352  SpO2 99 % 01/21/20 1352  Vitals shown include unvalidated device data.  Last Pain:  Vitals:   01/21/20 1234  TempSrc: Oral  PainSc: 0-No pain         Complications: No complications documented.

## 2020-01-22 ENCOUNTER — Other Ambulatory Visit: Payer: Self-pay

## 2020-01-22 LAB — SURGICAL PATHOLOGY

## 2020-01-26 ENCOUNTER — Encounter (HOSPITAL_COMMUNITY): Payer: Self-pay | Admitting: Gastroenterology

## 2020-02-03 ENCOUNTER — Ambulatory Visit: Payer: 59 | Admitting: Gastroenterology

## 2020-02-08 ENCOUNTER — Other Ambulatory Visit: Payer: Self-pay | Admitting: Family Medicine

## 2020-02-08 NOTE — Telephone Encounter (Signed)
Requested Prescriptions  Pending Prescriptions Disp Refills  . varenicline (CHANTIX STARTING MONTH PAK) 0.5 MG X 11 & 1 MG X 42 tablet [Pharmacy Med Name: CHANTIX STARTING MONTH BOX] 25 each     Sig: TAKE AS DIRECTED     Psychiatry:  Drug Dependence Therapy Passed - 02/08/2020 12:00 PM      Passed - Valid encounter within last 12 months    Recent Outpatient Visits          3 months ago Ventral hernia without obstruction or gangrene   [facility], Lilia Argue, MD   3 months ago Generalized anxiety disorder   [facility], Lilia Argue, MD   5 months ago DOE (dyspnea on exertion)   [facility], Lilia Argue, MD   6 months ago Generalized anxiety disorder   [facility], Lilia Argue, MD   9 months ago History of acute pyelonephritis   [facility], Lilia Argue, MD             . prazosin (MINIPRESS) 1 MG capsule [Pharmacy Med Name: PRAZOSIN 1 MG CAPSULE] 180 capsule 0    Sig: TAKE 1 TO 2 CAPSULES (1 TO 2 MG TOTAL) BY MOUTH AT BEDTIME.     Cardiovascular:  Alpha Blockers Passed - 02/08/2020 12:00 PM      Passed - Last BP in normal range    BP Readings from Last 1 Encounters:  01/21/20 103/70         Passed - Valid encounter within last 6 months    Recent Outpatient Visits          3 months ago Ventral hernia without obstruction or gangrene   [facility], Lilia Argue, MD   3 months ago Generalized anxiety disorder   [facility], Lilia Argue, MD   5 months ago DOE (dyspnea on exertion)   [facility], Lilia Argue, MD   6 months ago Generalized anxiety disorder   [facility], Lilia Argue, MD   9 months ago History of acute pyelonephritis   [facility], Lilia Argue, MD

## 2020-02-10 ENCOUNTER — Telehealth: Payer: Self-pay | Admitting: Family Medicine

## 2020-02-10 NOTE — Telephone Encounter (Signed)
Copied from [place] [phone]. Topic: General - Call Back - No Documentation >> Feb 10, 2020 12:22 PM Erick Blinks wrote: Reason for CRM: Pt called requesting a call back from the clinic. She states that she has never received her antibiotic and also she needs to reinstate her handicap placard. Please advise  Best contact: [phone]

## 2020-02-10 NOTE — Telephone Encounter (Signed)
Pt states she had video visit with Dr Pamella Pert (?) and was supposed to get an abx. Pt never got.  I( see no record of a visit since June.)  [id number] - [place], [place] - [address] Phone:  [phone]  Fax:  [phone]     Also pt states she had a temporary handi capp placard, (runs out end of Oct) but she needs a permanent one, at least for a year.  Pt states she is on oxygen now.

## 2020-02-11 ENCOUNTER — Telehealth (INDEPENDENT_AMBULATORY_CARE_PROVIDER_SITE_OTHER): Payer: 59 | Admitting: Family Medicine

## 2020-02-11 ENCOUNTER — Other Ambulatory Visit: Payer: Self-pay

## 2020-02-11 DIAGNOSIS — J441 Chronic obstructive pulmonary disease with (acute) exacerbation: Secondary | ICD-10-CM

## 2020-02-11 DIAGNOSIS — J0100 Acute maxillary sinusitis, unspecified: Secondary | ICD-10-CM

## 2020-02-11 MED ORDER — PREDNISONE 20 MG PO TABS: 40.0000 mg | ORAL_TABLET | Freq: Every day | ORAL | 0 refills | Status: DC

## 2020-02-11 MED ORDER — CLARITHROMYCIN 500 MG PO TABS: 500.0000 mg | ORAL_TABLET | Freq: Two times a day (BID) | ORAL | 0 refills | Status: DC

## 2020-02-11 NOTE — Telephone Encounter (Signed)
Pt is calling again. Pt is wanting a call soon. Please advise.

## 2020-02-11 NOTE — Telephone Encounter (Signed)
Pt is calling again about this matter and pt would like a call about this. Pt is also is wanting her handicap place card renewed. Please advise.

## 2020-02-11 NOTE — Telephone Encounter (Signed)
Husband called about this matter. Talked to Mardene Celeste last time pt had an appt was back in June. I sch pt for a virtual appt for today. Husband stated that insurance is lapsing at the end of the day. Please advise.

## 2020-02-11 NOTE — Progress Notes (Signed)
Virtual Visit Note  I connected with patient on 02/11/20 at 544pm by phone (per patient preference) and verified that I am speaking with the correct person using two identifiers. Vicki [name] is currently located at home and patient is currently with them during visit. The provider, Rutherford Guys, MD is located in their office at time of visit.  I discussed the limitations, risks, security and privacy concerns of performing an evaluation and management service by telephone and the availability of in person appointments. I also discussed with the patient that there may be a patient responsible charge related to this service. The patient expressed understanding and agreed to proceed.   I provided 10 minutes of non-face-to-face time during this encounter.  Chief Complaint  Patient presents with  . Sinus Problem    headache, sinus pressure, started a few days ago. Asking for antibiotic, allergy to cillins. May need to speak to husband if wife is not up to talking. Name is Vicki [name].    HPI ? Having severe Left sided HA associated with greenish drainage from Left nostril Pain radiating down into face and jaw She has been having on and off sinus issues for past several months However sign worse for past 4 days Having night sweats and chills Having bilateral ear pain unsure if drainage as waking up with wet pillow Having coughing with some mild SOB and wheezing Has been using inhalers (has COPD), using albuterol BID - provides relief 2 days ago had vomiting and diarrhea Keeping fluids up No sick contacts She tolerates biaxin well Has been doing allergy meds and max strength mucinex wo relief   Allergies  Allergen Reactions  . Jasmine Oil Shortness Of Breath and Swelling  . Levaquin [Levofloxacin] Shortness Of Breath, Itching, Swelling and Rash  . Penicillins Anaphylaxis and Hives    Tolerated ceftriaxone  . Aleve [Naproxen Sodium] Itching  . Ceftin [Cefuroxime Axetil] Other  (See Comments)    unsure  . Depo-Provera [Medroxyprogesterone Acetate] Other (See Comments)    Severe acne  . Doxycycline     Raises blood pressure   . Oxycodone Nausea And Vomiting  . Tramadol Other (See Comments)    Kept her up all night    Prior to Admission medications   Medication Sig Start Date End Date Taking? Authorizing Provider  acetaminophen (TYLENOL) 500 MG tablet Take 1,000 mg by mouth every 6 (six) hours as needed for moderate pain.   Yes [provider]  albuterol (VENTOLIN HFA) 108 (90 Base) MCG/ACT inhaler Inhale 2 puffs into the lungs every 4 (four) hours as needed for wheezing or shortness of breath. Patient taking differently: Inhale 2 puffs into the lungs 2 (two) times daily.  03/12/19  Yes Rutherford Guys, MD  ALPRAZolam Duanne Moron) 0.5 MG tablet Take 1 tablet (0.5 mg total) by mouth 3 (three) times daily as needed for anxiety. Patient taking differently: Take 0.5 mg by mouth 2 (two) times daily.  10/15/19  Yes Rutherford Guys, MD  amLODipine (NORVASC) 5 MG tablet Take 1 tablet (5 mg total) by mouth daily. 07/14/19  Yes Rutherford Guys, MD  aspirin EC 81 MG tablet Take 81 mg by mouth daily.   Yes [provider]  Bismuth Subsalicylate (PEPTO-BISMOL) 262 MG TABS Take 524 mg by mouth daily as needed (indigestion).   Yes [provider]  cetirizine (ZYRTEC) 10 MG tablet Take 1 tablet (10 mg total) by mouth daily. 07/19/17  Yes McVey, Gelene Mink, PA-C  citalopram (  CELEXA) 20 MG tablet TAKE 3 TABLETS BY MOUTH EVERY DAY 01/19/20  Yes Rutherford Guys, MD  eletriptan (RELPAX) 40 MG tablet TAKE 1 TAB BY MOUTH AT ONSET OF MIGRAINE OR HEADACHE AS NEEDED. MAY REPEAT IN 2 HOURS IF PERSISTS Patient taking differently: Take 40 mg by mouth See admin instructions. Take 40mg  at onset of migraine or headache as needed. May repeat in 2 hours if symptoms persist. 03/12/19  Yes Rutherford Guys, MD  Fluticasone-Salmeterol (ADVAIR DISKUS) 500-50 MCG/DOSE AEPB INHALE  1 PUFF INTO THE LUNGS TWICE A DAY Patient taking differently: Inhale 1 puff into the lungs 2 (two) times daily.  10/03/19  Yes Rutherford Guys, MD  gabapentin (NEURONTIN) 300 MG capsule Take 1 capsule (300 mg total) by mouth 2 (two) times daily. 08/17/19  Yes Rutherford Guys, MD  INCRUSE ELLIPTA 62.5 MCG/INH AEPB TAKE 1 PUFF BY MOUTH EVERY DAY Patient taking differently: Inhale 1 puff into the lungs daily.  09/24/19  Yes Rutherford Guys, MD  Menthol, Topical Analgesic, (BIOFREEZE EX) Apply 1 application topically daily as needed (back pain).   Yes [provider]  Multiple Vitamin (MULTIVITAMIN WITH MINERALS) TABS tablet Take 1 tablet by mouth daily. 04/12/19  Yes Swayze, Ava, DO  omeprazole (PRILOSEC) 20 MG capsule TAKE 1 CAPSULE BY MOUTH TWICE A DAY BEFORE A MEAL Patient taking differently: Take 20 mg by mouth 2 (two) times daily.  11/09/19  Yes Rutherford Guys, MD  ondansetron (ZOFRAN ODT) 4 MG disintegrating tablet Take 1 tablet (4 mg total) by mouth every 8 (eight) hours as needed for nausea or vomiting. 12/16/19  Yes Esterwood, Amy S, PA-C  ondansetron (ZOFRAN) 8 MG tablet Take 8 mg by mouth every 8 (eight) hours as needed for nausea or vomiting.   Yes [provider]  prazosin (MINIPRESS) 1 MG capsule TAKE 1 TO 2 CAPSULES (1 TO 2 MG TOTAL) BY MOUTH AT BEDTIME. 02/08/20  Yes Rutherford Guys, MD  promethazine (PHENERGAN) 25 MG tablet TAKE 1 TABLET BY MOUTH EVERY DAY Patient taking differently: Take 25 mg by mouth daily.  10/15/19  Yes Rutherford Guys, MD  rOPINIRole (REQUIP) 1 MG tablet TAKE 1 TABLET (1 MG TOTAL) BY MOUTH AT BEDTIME. 01/14/20  Yes Rutherford Guys, MD  Tetrahydrozoline HCl (VISINE OP) Place 1 drop into both eyes daily as needed (dry eyes).   Yes [provider]  thiamine 100 MG tablet Take 1 tablet (100 mg total) by mouth daily. 04/12/19  Yes Swayze, Ava, DO  traZODone (DESYREL) 50 MG tablet TAKE 1 TABLET BY MOUTH EVERYDAY AT BEDTIME Patient taking  differently: Take 50 mg by mouth at bedtime.  06/02/19  Yes Rutherford Guys, MD  ibuprofen (ADVIL,MOTRIN) 800 MG tablet Take 1 tablet (800 mg total) by mouth every 8 (eight) hours as needed for mild pain. 04/22/18   Rutherford Guys, MD    Past Medical History:  Diagnosis Date  . Allergy    seasonal allergies  . Anxiety    on meds  . Arthritis    hands  . Asthma    childhood-current  . Carpal tunnel syndrome    had bilateral sx to tx  . Cataract    bilateral--no sx yet  . COPD (chronic obstructive pulmonary disease) ([place])   . Depression    on meds  . Diverticulitis   . Emphysema of lung ([place])    uses MDI  . Essential hypertension, benign 07/14/2019   on meds  .  Hyperlipidemia    on meds  . Oxygen deficiency    at night for sleep    Past Surgical History:  Procedure Laterality Date  . ABDOMINAL HYSTERECTOMY  2009  . APPENDECTOMY    . ARTHROSCOPIC REPAIR ACL Left 2011/2014   x 2  . BIOPSY  01/21/2020   Procedure: BIOPSY;  Surgeon: Mauri Pole, MD;  Location: [facility];  Service: Endoscopy;;  EGD and COLON  . BUNIONECTOMY  2006   both  . CARPAL TUNNEL RELEASE Bilateral 10/28/2013   Procedure: BILATERAL CARPAL TUNNEL RELEASE;  Surgeon: Wynonia Sours, MD;  Location: [facility];  Service: Orthopedics;  Laterality: Bilateral;  . CHOLECYSTECTOMY  1994  . COLONOSCOPY WITH PROPOFOL N/A 01/21/2020   Procedure: COLONOSCOPY WITH PROPOFOL;  Surgeon: Mauri Pole, MD;  Location: [facility];  Service: Endoscopy;  Laterality: N/A;  . DILATION AND CURETTAGE OF UTERUS    . ESOPHAGOGASTRODUODENOSCOPY (EGD) WITH PROPOFOL N/A 01/21/2020   Procedure: ESOPHAGOGASTRODUODENOSCOPY (EGD) WITH PROPOFOL;  Surgeon: Mauri Pole, MD;  Location: [facility];  Service: Endoscopy;  Laterality: N/A;  . POLYPECTOMY  01/21/2020   Procedure: POLYPECTOMY;  Surgeon: Mauri Pole, MD;  Location: [facility];  Service: Endoscopy;;  . TUBAL LIGATION  2004    Social  History   Tobacco Use  . Smoking status: Current Every Day Smoker    Packs/day: 1.00    Years: 34.00    Pack years: 34.00    Types: Cigarettes  . Smokeless tobacco: Never Used  . Tobacco comment: on Chantix  Substance Use Topics  . Alcohol use: Yes    Comment: everyday    Family History  Problem Relation Age of Onset  . Brain cancer Mother 109  . Lung cancer Mother 44  . Breast cancer Sister 96  . Ovarian cancer Sister 35  . Colon cancer Neg Hx   . Colon polyps Neg Hx   . Esophageal cancer Neg Hx   . Rectal cancer Neg Hx   . Stomach cancer Neg Hx     ROS Per hpi  Objective  Vitals as reported by the patient: none  Gen: AAOx3, NAD Speaking in full sentences, no apparent resp distress  ASSESSMENT and PLAN  1. Acute non-recurrent maxillary sinusitis 2. Chronic obstructive pulmonary disease with acute exacerbation (HCC) Discussed supportive measures, new meds r/se/b and RTC precautions.  Other orders - clarithromycin (BIAXIN) 500 MG tablet; Take 1 tablet (500 mg total) by mouth 2 (two) times daily. - predniSONE (DELTASONE) 20 MG tablet; Take 2 tablets (40 mg total) by mouth daily with breakfast.  FOLLOW-UP: prn   The above assessment and management plan was discussed with the patient. The patient verbalized understanding of and has agreed to the management plan. Patient is aware to call the clinic if symptoms persist or worsen. Patient is aware when to return to the clinic for a follow-up visit. Patient educated on when it is appropriate to go to the emergency department.     Rutherford Guys, MD [facility] [place], [place] [address] Ph.  [phone] Fax [phone]

## 2020-03-31 ENCOUNTER — Telehealth: Payer: Self-pay | Admitting: Family Medicine

## 2020-03-31 NOTE — Telephone Encounter (Signed)
See patient's request below. Patient requesting a 90 day supply of Lorazepam which is not on current med list.

## 2020-03-31 NOTE — Telephone Encounter (Signed)
Medication Refill - Medication:LORazepam (ATIVAN) tablet 1 mg  Pt can get a cheaper rate for a 90 day supply / please advise    Has the patient contacted their pharmacy? No. (Agent: If no, request that the patient contact the pharmacy for the refill.) (Agent: If yes, when and what did the pharmacy advise?)  Preferred Pharmacy (with phone number or street name): [facility]   Agent: Please be advised that RX refills may take up to 3 business days. We ask that you follow-up with your pharmacy.

## 2020-04-01 ENCOUNTER — Other Ambulatory Visit: Payer: Self-pay | Admitting: Family Medicine

## 2020-04-01 NOTE — Telephone Encounter (Signed)
Spoke with patient scheduled TOC  with Kathrin Ruddy   And is already calling back . She really needs a courtesy refill on this  XANAX and would like to to add  promethazine (PHENERGAN) 25 MG tablet    Sent to  [id number] [place], [place] - [address]  [address], [place] [address]

## 2020-04-01 NOTE — Telephone Encounter (Signed)
I have attempted to call pt and there was no answer. I did leave a voice mail stating that we do not have the Ativan medication on her med list and she will have to wait until her visit with Rich to see if he is willing to Prescribe it.

## 2020-04-01 NOTE — Telephone Encounter (Signed)
Requested medication (s) are due for refill today: no  Requested medication (s) are on the active medication list: yes   Last refill:  10/15/2019  Future visit scheduled: no  Notes to clinic:  this refill cannot be delegated    Requested Prescriptions  Pending Prescriptions Disp Refills   ALPRAZolam (XANAX) 0.5 MG tablet 30 tablet 1    Sig: Take 1 tablet (0.5 mg total) by mouth 3 (three) times daily as needed for anxiety.      Not Delegated - Psychiatry:  Anxiolytics/Hypnotics Failed - 04/01/2020 11:23 AM      Failed - This refill cannot be delegated      Failed - Urine Drug Screen completed in last 360 days      Passed - Valid encounter within last 6 months    Recent Outpatient Visits           1 month ago Acute non-recurrent maxillary sinusitis   [facility], Lilia Argue, MD   4 months ago Ventral hernia without obstruction or gangrene   [facility], Lilia Argue, MD   5 months ago Generalized anxiety disorder   [facility], Lilia Argue, MD   7 months ago DOE (dyspnea on exertion)   [facility], Lilia Argue, MD   8 months ago Generalized anxiety disorder   [facility], Lilia Argue, MD

## 2020-04-01 NOTE — Telephone Encounter (Signed)
Medication Refill - Medication: ALPRAZolam (XANAX) 0.5 MG tablet (Patient is completley out of medication and would like prescription sent in today)   Has the patient contacted their pharmacy? yes (Agent: If no, request that the patient contact the pharmacy for the refill.) (Agent: If yes, when and what did the pharmacy advise?)Contact PCP  Preferred Pharmacy (with phone number or street name):  [id number] [place], [place] - [address] Phone:  [phone]  Fax:  [phone]       Agent: Please be advised that RX refills may take up to 3 business days. We ask that you follow-up with your pharmacy.

## 2020-04-04 MED ORDER — ALPRAZOLAM 0.5 MG PO TABS: 0.5000 mg | ORAL_TABLET | Freq: Three times a day (TID) | ORAL | 1 refills | Status: DC | PRN

## 2020-04-04 NOTE — Telephone Encounter (Signed)
Patient is requesting a refill of the following medications: Requested Prescriptions   Pending Prescriptions Disp Refills   ALPRAZolam (XANAX) 0.5 MG tablet 30 tablet 1    Sig: Take 1 tablet (0.5 mg total) by mouth 3 (three) times daily as needed for anxiety.    Date of patient request: 04/01/20 Last office visit: 02/11/20- video visit with [place] Date of last refill: 10/15/19 Last refill amount: 30 +1  Follow up time period per chart: 04/05/20 w/ Orland Mustard TOC

## 2020-04-05 ENCOUNTER — Ambulatory Visit: Payer: 59 | Admitting: Registered Nurse

## 2020-04-14 ENCOUNTER — Telehealth: Payer: Self-pay | Admitting: Registered Nurse

## 2020-04-14 NOTE — Telephone Encounter (Signed)
Patient called because she misplaced her paperwork for a permanent handicapped parking sticker. Temporary sticker expired. Told patient she needs her TOC appointment before paperwork can be completed. Patient already scheduled for TOC on 05/27/2020.

## 2020-05-05 ENCOUNTER — Telehealth (INDEPENDENT_AMBULATORY_CARE_PROVIDER_SITE_OTHER): Payer: Self-pay | Admitting: Family Medicine

## 2020-05-05 DIAGNOSIS — R112 Nausea with vomiting, unspecified: Secondary | ICD-10-CM

## 2020-05-05 MED ORDER — PROMETHAZINE HCL 25 MG PO TABS: 25.0000 mg | ORAL_TABLET | Freq: Every day | ORAL | 3 refills | Status: DC

## 2020-05-05 NOTE — Progress Notes (Signed)
No charge, had to cancel due to lack of insurance.

## 2020-05-05 NOTE — Patient Instructions (Signed)
° ° ° °  If you have lab work done today you will be contacted with your lab results within the next 2 weeks.  If you have not heard from us then please contact us. The fastest way to get your results is to register for My Chart. ° ° °IF you received an x-ray today, you will receive an invoice from [facility]. Please contact [facility] at [phone] with questions or concerns regarding your invoice.  ° °IF you received labwork today, you will receive an invoice from [facility]. Please contact [facility] at [phone] with questions or concerns regarding your invoice.  ° °Our billing staff will not be able to assist you with questions regarding bills from these companies. ° °You will be contacted with the lab results as soon as they are available. The fastest way to get your results is to activate your My Chart account. Instructions are located on the last page of this paperwork. If you have not heard from us regarding the results in 2 weeks, please contact this office. °  ° ° ° °

## 2020-05-09 ENCOUNTER — Ambulatory Visit: Payer: 59 | Admitting: Family Medicine

## 2020-05-16 ENCOUNTER — Ambulatory Visit: Payer: Self-pay | Admitting: Registered Nurse

## 2020-05-27 ENCOUNTER — Encounter: Payer: 59 | Admitting: Registered Nurse

## 2020-06-13 ENCOUNTER — Ambulatory Visit (INDEPENDENT_AMBULATORY_CARE_PROVIDER_SITE_OTHER): Payer: 59 | Admitting: Registered Nurse

## 2020-06-13 ENCOUNTER — Other Ambulatory Visit: Payer: Self-pay

## 2020-06-13 ENCOUNTER — Encounter: Payer: Self-pay | Admitting: Registered Nurse

## 2020-06-13 VITALS — BP 131/87 | HR 89 | Temp 97.7°F | Resp 18 | Ht 63.0 in | Wt 159.6 lb

## 2020-06-13 DIAGNOSIS — J302 Other seasonal allergic rhinitis: Secondary | ICD-10-CM | POA: Diagnosis not present

## 2020-06-13 DIAGNOSIS — J449 Chronic obstructive pulmonary disease, unspecified: Secondary | ICD-10-CM | POA: Diagnosis not present

## 2020-06-13 DIAGNOSIS — R112 Nausea with vomiting, unspecified: Secondary | ICD-10-CM

## 2020-06-13 DIAGNOSIS — I10 Essential (primary) hypertension: Secondary | ICD-10-CM | POA: Diagnosis not present

## 2020-06-13 DIAGNOSIS — G43909 Migraine, unspecified, not intractable, without status migrainosus: Secondary | ICD-10-CM

## 2020-06-13 DIAGNOSIS — G2581 Restless legs syndrome: Secondary | ICD-10-CM

## 2020-06-13 DIAGNOSIS — Z23 Encounter for immunization: Secondary | ICD-10-CM | POA: Diagnosis not present

## 2020-06-13 DIAGNOSIS — F32A Depression, unspecified: Secondary | ICD-10-CM

## 2020-06-13 MED ORDER — TIOTROPIUM BROMIDE-OLODATEROL 2.5-2.5 MCG/ACT IN AERS: 2.0000 | INHALATION_SPRAY | Freq: Every day | RESPIRATORY_TRACT | 11 refills | Status: DC

## 2020-06-13 MED ORDER — ELETRIPTAN HYDROBROMIDE 40 MG PO TABS: 40.0000 mg | ORAL_TABLET | ORAL | 0 refills | Status: DC

## 2020-06-13 MED ORDER — CITALOPRAM HYDROBROMIDE 20 MG PO TABS: 60.0000 mg | ORAL_TABLET | Freq: Every day | ORAL | 2 refills | Status: DC

## 2020-06-13 MED ORDER — CETIRIZINE HCL 10 MG PO TABS: 10.0000 mg | ORAL_TABLET | Freq: Every day | ORAL | 11 refills | Status: DC

## 2020-06-13 MED ORDER — PROMETHAZINE HCL 25 MG PO TABS: 25.0000 mg | ORAL_TABLET | Freq: Every day | ORAL | 3 refills | Status: DC

## 2020-06-13 MED ORDER — ALPRAZOLAM 1 MG PO TABS: 1.0000 mg | ORAL_TABLET | Freq: Three times a day (TID) | ORAL | 0 refills | Status: DC | PRN

## 2020-06-13 MED ORDER — ALBUTEROL SULFATE HFA 108 (90 BASE) MCG/ACT IN AERS: 2.0000 | INHALATION_SPRAY | Freq: Two times a day (BID) | RESPIRATORY_TRACT | 11 refills | Status: DC

## 2020-06-13 MED ORDER — ROPINIROLE HCL 2 MG PO TABS: 1.0000 mg | ORAL_TABLET | Freq: Every day | ORAL | 0 refills | Status: DC

## 2020-06-13 MED ORDER — AMLODIPINE BESYLATE 5 MG PO TABS: 5.0000 mg | ORAL_TABLET | Freq: Every day | ORAL | 0 refills | Status: DC

## 2020-06-13 MED ORDER — PRAZOSIN HCL 1 MG PO CAPS: ORAL_CAPSULE | ORAL | 2 refills | Status: DC

## 2020-06-13 MED ORDER — FLUTICASONE-SALMETEROL 500-50 MCG/DOSE IN AEPB: 1.0000 | INHALATION_SPRAY | Freq: Two times a day (BID) | RESPIRATORY_TRACT | 11 refills | Status: DC

## 2020-06-13 MED ORDER — GABAPENTIN 300 MG PO CAPS: 300.0000 mg | ORAL_CAPSULE | Freq: Two times a day (BID) | ORAL | 3 refills | Status: DC

## 2020-06-13 MED ORDER — PANTOPRAZOLE SODIUM 40 MG PO TBEC: 40.0000 mg | DELAYED_RELEASE_TABLET | Freq: Every day | ORAL | 3 refills | Status: DC

## 2020-06-13 NOTE — Progress Notes (Signed)
Established Patient Office Visit  Subjective:  Patient ID: Vicki [name], female    DOB: 09/21/1965  Age: 55 y.o. MRN: [id number]  CC:  Chief Complaint  Patient presents with  . Medication Refill    Patient states she is here for medication refill and also discuss handicap sticker.     HPI Vicki [name] presents for med refill  Presents with husband with list of adjustments needed. Will order as such.  Also needs handicapped placard form filled out.  Due for tdap, requests today  Past Medical History:  Diagnosis Date  . Allergy    seasonal allergies  . Anxiety    on meds  . Arthritis    hands  . Asthma    childhood-current  . Carpal tunnel syndrome    had bilateral sx to tx  . Cataract    bilateral--no sx yet  . COPD (chronic obstructive pulmonary disease) ([place])   . Depression    on meds  . Diverticulitis   . Emphysema of lung ([place])    uses MDI  . Essential hypertension, benign 07/14/2019   on meds  . Hyperlipidemia    on meds  . Oxygen deficiency    at night for sleep    Past Surgical History:  Procedure Laterality Date  . ABDOMINAL HYSTERECTOMY  2009  . APPENDECTOMY    . ARTHROSCOPIC REPAIR ACL Left 2011/2014   x 2  . BIOPSY  01/21/2020   Procedure: BIOPSY;  Surgeon: Mauri Pole, MD;  Location: [facility];  Service: Endoscopy;;  EGD and COLON  . BUNIONECTOMY  2006   both  . CARPAL TUNNEL RELEASE Bilateral 10/28/2013   Procedure: BILATERAL CARPAL TUNNEL RELEASE;  Surgeon: Wynonia Sours, MD;  Location: [facility];  Service: Orthopedics;  Laterality: Bilateral;  . CHOLECYSTECTOMY  1994  . COLONOSCOPY WITH PROPOFOL N/A 01/21/2020   Procedure: COLONOSCOPY WITH PROPOFOL;  Surgeon: Mauri Pole, MD;  Location: [facility];  Service: Endoscopy;  Laterality: N/A;  . DILATION AND CURETTAGE OF UTERUS    . ESOPHAGOGASTRODUODENOSCOPY (EGD) WITH PROPOFOL N/A 01/21/2020   Procedure: ESOPHAGOGASTRODUODENOSCOPY (EGD) WITH  PROPOFOL;  Surgeon: Mauri Pole, MD;  Location: [facility];  Service: Endoscopy;  Laterality: N/A;  . POLYPECTOMY  01/21/2020   Procedure: POLYPECTOMY;  Surgeon: Mauri Pole, MD;  Location: [facility];  Service: Endoscopy;;  . TUBAL LIGATION  2004    Family History  Problem Relation Age of Onset  . Brain cancer Mother 35  . Lung cancer Mother 32  . Breast cancer Sister 65  . Ovarian cancer Sister 15  . Colon cancer Neg Hx   . Colon polyps Neg Hx   . Esophageal cancer Neg Hx   . Rectal cancer Neg Hx   . Stomach cancer Neg Hx     Social History   Socioeconomic History  . Marital status: Married    Spouse name: [name]  . Number of children: 3  . Years of education: 12th grade  . Highest education level: Not on file  Occupational History  . Occupation: Airline pilot: [employer]  Tobacco Use  . Smoking status: Current Every Day Smoker    Packs/day: 1.00    Years: 34.00    Pack years: 34.00    Types: Cigarettes  . Smokeless tobacco: Never Used  . Tobacco comment: on [employer]  Vaping Use  . Vaping Use: Never used  Substance and Sexual Activity  . Alcohol  use: Yes    Comment: everyday  . Drug use: No  . Sexual activity: Yes  Other Topics Concern  . Not on file  Social History Narrative   Lives with her husband.   Their 3 children live independently.   Social Determinants of Health   Financial Resource Strain: Not on file  Food Insecurity: Not on file  Transportation Needs: Not on file  Physical Activity: Not on file  Stress: Not on file  Social Connections: Not on file  Intimate Partner Violence: Not on file    Outpatient Medications Prior to Visit  Medication Sig Dispense Refill  . acetaminophen (TYLENOL) 500 MG tablet Take 1,000 mg by mouth every 6 (six) hours as needed for moderate pain.    Marland Kitchen albuterol (VENTOLIN HFA) 108 (90 Base) MCG/ACT inhaler Inhale 2 puffs into the lungs every 4 (four) hours as needed for wheezing or  shortness of breath. (Patient taking differently: Inhale 2 puffs into the lungs 2 (two) times daily.) 18 g 3  . ALPRAZolam (XANAX) 0.5 MG tablet Take 1 tablet (0.5 mg total) by mouth 3 (three) times daily as needed for anxiety. (Patient taking differently: Take 1 mg by mouth 3 (three) times daily as needed for anxiety.) 30 tablet 1  . amLODipine (NORVASC) 5 MG tablet Take 1 tablet (5 mg total) by mouth daily. 90 tablet 3  . aspirin EC 81 MG tablet Take 81 mg by mouth daily.    . cetirizine (ZYRTEC) 10 MG tablet Take 1 tablet (10 mg total) by mouth daily. 30 tablet 11  . citalopram (CELEXA) 20 MG tablet TAKE 3 TABLETS BY MOUTH EVERY DAY 270 tablet 2  . eletriptan (RELPAX) 40 MG tablet TAKE 1 TAB BY MOUTH AT ONSET OF MIGRAINE OR HEADACHE AS NEEDED. MAY REPEAT IN 2 HOURS IF PERSISTS (Patient taking differently: Take 40 mg by mouth See admin instructions. Take 40mg  at onset of migraine or headache as needed. May repeat in 2 hours if symptoms persist.) 10 tablet 5  . Fluticasone-Salmeterol (ADVAIR DISKUS) 500-50 MCG/DOSE AEPB INHALE 1 PUFF INTO THE LUNGS TWICE A DAY (Patient taking differently: Inhale 1 puff into the lungs 2 (two) times daily.) 180 each 1  . gabapentin (NEURONTIN) 300 MG capsule Take 1 capsule (300 mg total) by mouth 2 (two) times daily. 60 capsule 3  . Multiple Vitamin (MULTIVITAMIN WITH MINERALS) TABS tablet Take 1 tablet by mouth daily. 30 tablet 0  . omeprazole (PRILOSEC) 20 MG capsule TAKE 1 CAPSULE BY MOUTH TWICE A DAY BEFORE A MEAL (Patient taking differently: Take 20 mg by mouth 2 (two) times daily.) 180 capsule 1  . ondansetron (ZOFRAN) 8 MG tablet Take 8 mg by mouth every 8 (eight) hours as needed for nausea or vomiting.    . prazosin (MINIPRESS) 1 MG capsule TAKE 1 TO 2 CAPSULES (1 TO 2 MG TOTAL) BY MOUTH AT BEDTIME. 180 capsule 0  . rOPINIRole (REQUIP) 1 MG tablet TAKE 1 TABLET (1 MG TOTAL) BY MOUTH AT BEDTIME. 90 tablet 0  . traZODone (DESYREL) 50 MG tablet TAKE 1 TABLET BY  MOUTH EVERYDAY AT BEDTIME (Patient taking differently: Take 50 mg by mouth at bedtime.) 90 tablet 1  . Bismuth Subsalicylate (PEPTO-BISMOL) 262 MG TABS Take 524 mg by mouth daily as needed (indigestion). (Patient not taking: Reported on 06/13/2020)    . clarithromycin (BIAXIN) 500 MG tablet Take 1 tablet (500 mg total) by mouth 2 (two) times daily. (Patient not taking: Reported on 06/13/2020) 14 tablet  0  . INCRUSE ELLIPTA 62.5 MCG/INH AEPB TAKE 1 PUFF BY MOUTH EVERY DAY (Patient not taking: Reported on 06/13/2020) 30 each 6  . Menthol, Topical Analgesic, (BIOFREEZE EX) Apply 1 application topically daily as needed (back pain). (Patient not taking: Reported on 06/13/2020)    . ondansetron (ZOFRAN ODT) 4 MG disintegrating tablet Take 1 tablet (4 mg total) by mouth every 8 (eight) hours as needed for nausea or vomiting. (Patient not taking: Reported on 06/13/2020) 90 tablet 2  . predniSONE (DELTASONE) 20 MG tablet Take 2 tablets (40 mg total) by mouth daily with breakfast. (Patient not taking: Reported on 06/13/2020) 10 tablet 0  . promethazine (PHENERGAN) 25 MG tablet Take 1 tablet (25 mg total) by mouth daily. 30 tablet 3  . Tetrahydrozoline HCl (VISINE OP) Place 1 drop into both eyes daily as needed (dry eyes). (Patient not taking: Reported on 06/13/2020)    . thiamine 100 MG tablet Take 1 tablet (100 mg total) by mouth daily. (Patient not taking: Reported on 06/13/2020) 30 tablet 0   No facility-administered medications prior to visit.    Allergies  Allergen Reactions  . Jasmine Oil Shortness Of Breath and Swelling  . Levaquin [Levofloxacin] Shortness Of Breath, Itching, Swelling and Rash  . Penicillins Anaphylaxis and Hives    Tolerated ceftriaxone  . Aleve [Naproxen Sodium] Itching  . Ceftin [Cefuroxime Axetil] Other (See Comments)    unsure  . Depo-Provera [Medroxyprogesterone Acetate] Other (See Comments)    Severe acne  . Doxycycline     Raises blood pressure   . Oxycodone Nausea And  Vomiting  . Tramadol Other (See Comments)    Kept her up all night    ROS Review of Systems  Constitutional: Negative.   HENT: Negative.   Eyes: Negative.   Respiratory: Negative.   Cardiovascular: Negative.   Gastrointestinal: Negative.   Genitourinary: Negative.   Musculoskeletal: Negative.   Skin: Negative.   Neurological: Negative.   Psychiatric/Behavioral: Negative.   All other systems reviewed and are negative.     Objective:    Physical Exam Vitals and nursing note reviewed.  Constitutional:      General: She is not in acute distress.    Appearance: Normal appearance. She is normal weight. She is not ill-appearing, toxic-appearing or diaphoretic.  Cardiovascular:     Rate and Rhythm: Normal rate and regular rhythm.     Heart sounds: Normal heart sounds. No murmur heard. No friction rub. No gallop.   Pulmonary:     Effort: Pulmonary effort is normal. No respiratory distress.     Breath sounds: Normal breath sounds. No stridor. No wheezing, rhonchi or rales.  Chest:     Chest wall: No tenderness.  Skin:    General: Skin is warm and dry.  Neurological:     General: No focal deficit present.     Mental Status: She is alert and oriented to person, place, and time. Mental status is at baseline.  Psychiatric:        Mood and Affect: Mood normal.        Behavior: Behavior normal.        Thought Content: Thought content normal.        Judgment: Judgment normal.     BP 131/87   Pulse 89   Temp 97.7 F (36.5 C) (Temporal)   Resp 18   Ht 5\' 3"  (1.6 m)   Wt 159 lb 9.6 oz (72.4 kg)   SpO2 95%   BMI 28.27  kg/m  Wt Readings from Last 3 Encounters:  06/13/20 159 lb 9.6 oz (72.4 kg)  01/21/20 160 lb (72.6 kg)  01/05/20 160 lb (72.6 kg)     There are no preventive care reminders to display for this patient.  There are no preventive care reminders to display for this patient.  Lab Results  Component Value Date   TSH 3.240 08/17/2019   Lab Results   Component Value Date   WBC 7.8 08/17/2019   HGB 14.6 08/17/2019   HCT 41.1 08/17/2019   MCV 93 08/17/2019   PLT 149 (L) 08/17/2019   Lab Results  Component Value Date   NA 137 08/17/2019   K 4.0 08/17/2019   CO2 21 08/17/2019   GLUCOSE 105 (H) 08/17/2019   BUN 11 08/17/2019   CREATININE 0.94 08/17/2019   BILITOT 1.0 08/17/2019   ALKPHOS 161 (H) 08/17/2019   AST 85 (H) 08/17/2019   ALT 44 (H) 08/17/2019   PROT 7.3 08/17/2019   ALBUMIN 4.6 08/17/2019   CALCIUM 8.9 08/17/2019   ANIONGAP 11 04/11/2019   Lab Results  Component Value Date   CHOL 207 (H) 07/14/2019   Lab Results  Component Value Date   HDL 66 07/14/2019   Lab Results  Component Value Date   LDLCALC 96 07/14/2019   Lab Results  Component Value Date   TRIG 274 (H) 07/14/2019   Lab Results  Component Value Date   CHOLHDL 3.1 07/14/2019   Lab Results  Component Value Date   HGBA1C 5.5 01/17/2018      Assessment & Plan:   Problem List Items Addressed This Visit      Cardiovascular and Mediastinum   Essential hypertension, benign - Primary   Relevant Medications   amLODipine (NORVASC) 5 MG tablet   prazosin (MINIPRESS) 1 MG capsule     Respiratory   COPD (chronic obstructive pulmonary disease) ([facility])   Relevant Medications   promethazine (PHENERGAN) 25 MG tablet   albuterol (VENTOLIN HFA) 108 (90 Base) MCG/ACT inhaler   cetirizine (ZYRTEC) 10 MG tablet   Fluticasone-Salmeterol (ADVAIR DISKUS) 500-50 MCG/DOSE AEPB   Tiotropium Bromide-Olodaterol 2.5-2.5 MCG/ACT AERS     Digestive   Nausea and vomiting   Relevant Medications   promethazine (PHENERGAN) 25 MG tablet   pantoprazole (PROTONIX) 40 MG tablet     Other   Depression   Relevant Medications   ALPRAZolam (XANAX) 1 MG tablet   citalopram (CELEXA) 20 MG tablet   RLS (restless legs syndrome)   Relevant Medications   gabapentin (NEURONTIN) 300 MG capsule   rOPINIRole (REQUIP) 2 MG tablet    Other Visit Diagnoses    Seasonal  allergies       Relevant Medications   cetirizine (ZYRTEC) 10 MG tablet   Migraine without status migrainosus, not intractable, unspecified migraine type       Relevant Medications   amLODipine (NORVASC) 5 MG tablet   gabapentin (NEURONTIN) 300 MG capsule   citalopram (CELEXA) 20 MG tablet   eletriptan (RELPAX) 40 MG tablet   prazosin (MINIPRESS) 1 MG capsule      No orders of the defined types were placed in this encounter.   Follow-up: No follow-ups on file.   PLAN  Meds ordered as above  Return to est care with new PCP at first convenience  Will withhold lab orders until that time  Handicapped placard filled out and given to patient  tdap given  Patient encouraged to call clinic with any questions, comments,  or concerns.  I spent 44 minutes with this patient, more than 50% of which was spent counseling and/or educating.  Maximiano Coss, NP

## 2020-06-13 NOTE — Patient Instructions (Signed)
° ° ° °  If you have lab work done today you will be contacted with your lab results within the next 2 weeks.  If you have not heard from us then please contact us. The fastest way to get your results is to register for My Chart. ° ° °IF you received an x-ray today, you will receive an invoice from [facility]. Please contact [facility] at [phone] with questions or concerns regarding your invoice.  ° °IF you received labwork today, you will receive an invoice from [facility]. Please contact [facility] at [phone] with questions or concerns regarding your invoice.  ° °Our billing staff will not be able to assist you with questions regarding bills from these companies. ° °You will be contacted with the lab results as soon as they are available. The fastest way to get your results is to activate your My Chart account. Instructions are located on the last page of this paperwork. If you have not heard from us regarding the results in 2 weeks, please contact this office. °  ° ° ° °

## 2020-06-17 ENCOUNTER — Telehealth: Payer: Self-pay | Admitting: General Practice

## 2020-06-17 NOTE — Telephone Encounter (Signed)
Spouse called on behalf of patient to follow up on prescriptions that were supposed to be called in to pharmacy. Still waiting for Rx for zpac and ear drops to be called in.   Pharmacy:  [id number] - [place], [place]  [address] [place] [address]  Phone:  [phone] Fax:  [phone]  DEA #:  [id number]  Please advise at [phone].

## 2020-06-17 NOTE — Telephone Encounter (Signed)
I see Advair but no drops or Zpak please advise

## 2020-06-20 NOTE — Telephone Encounter (Signed)
Pt husband called about getting her Zpak, and ear drops but I see no mention of these in your note please advise if these were(Not) part of the treatment plan

## 2020-06-20 NOTE — Telephone Encounter (Signed)
Received call from spouse [place]. Provider still hasn't called in Rx for zpac and ear drops.   Pharmacy:   [id number] - [place], [place]  [address] [place] [address]  Phone:  [phone] Fax:  [phone]  DEA #:  [id number]  Please advise at [phone]

## 2020-06-25 IMAGING — MG DIGITAL SCREENING BILATERAL MAMMOGRAM WITH TOMO AND CAD
8 series · 8 of 24 positions shown · non-contrast
Comparison: Previous exam(s).

CLINICAL DATA: Screening.

EXAM:
DIGITAL SCREENING BILATERAL MAMMOGRAM WITH TOMO AND CAD

[L CC synth-2D]
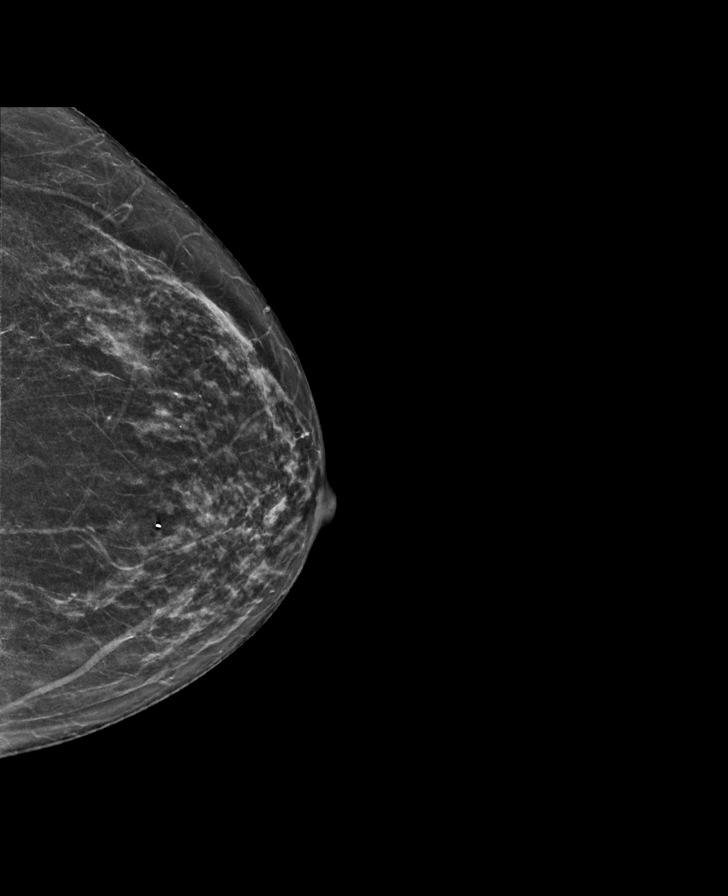

[R MLO synth-2D]
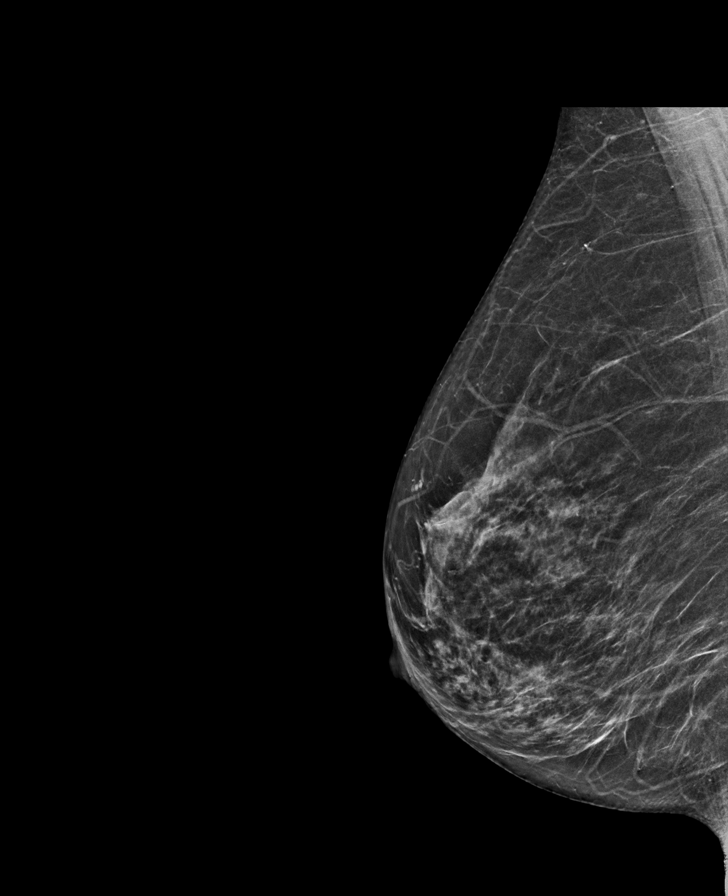

[L MLO synth-2D]
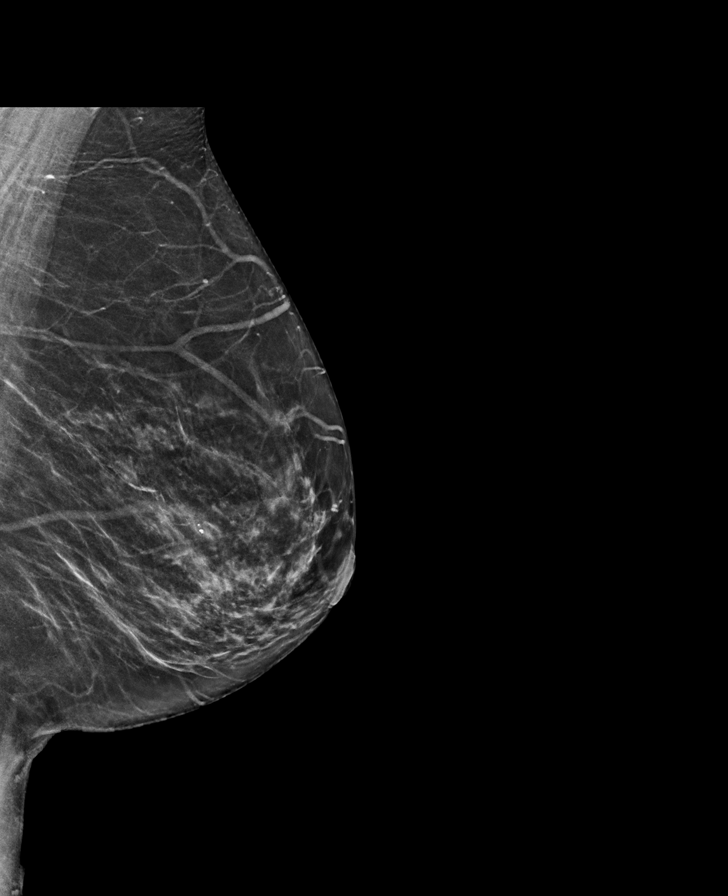

[R CC synth-2D]
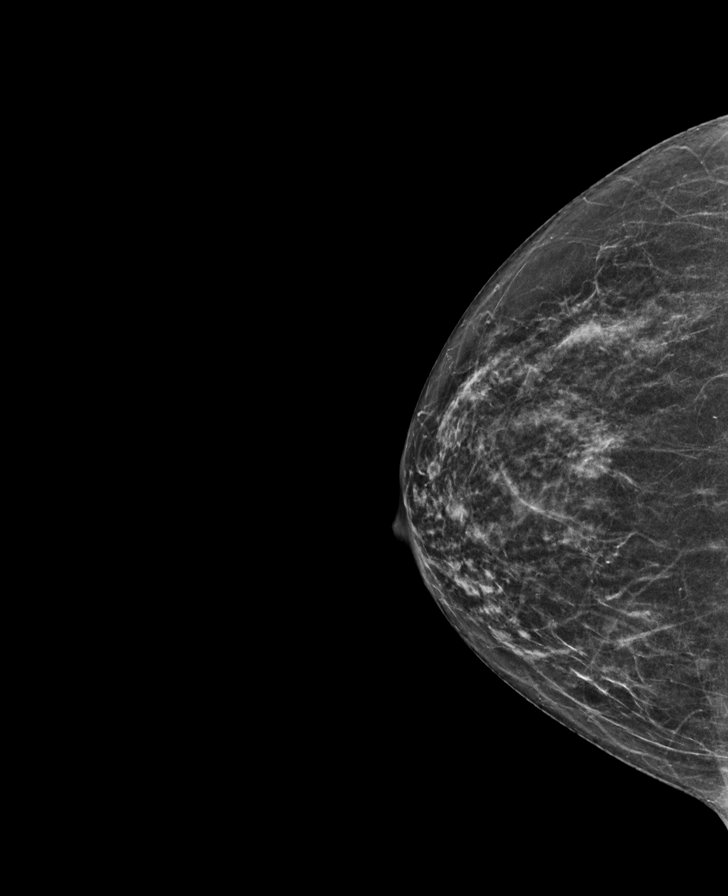

[L CC tomo · tomo slice 28/55.0]
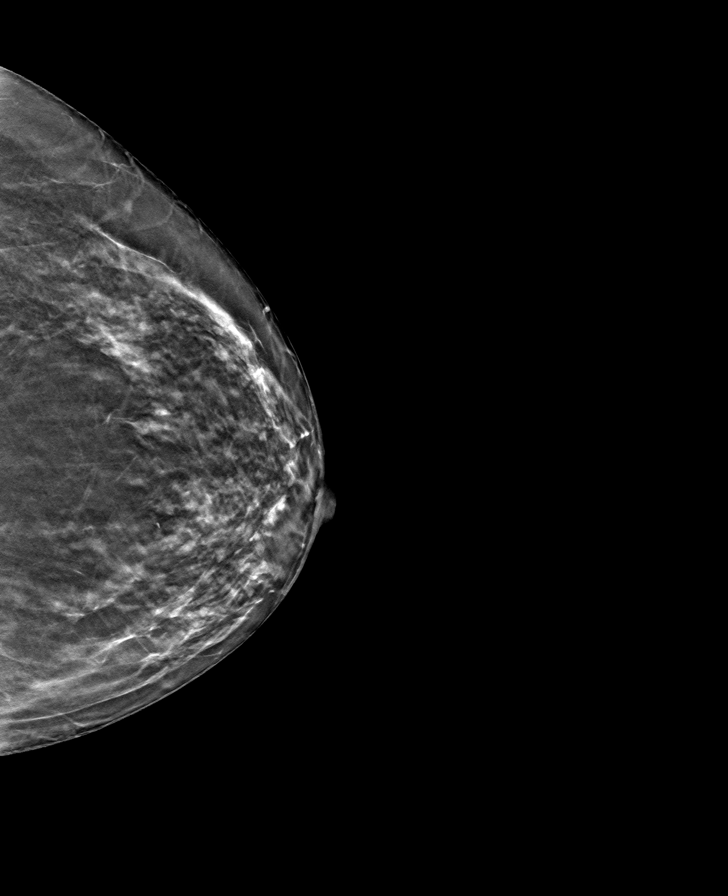

[R MLO tomo · tomo slice 31/62.0]
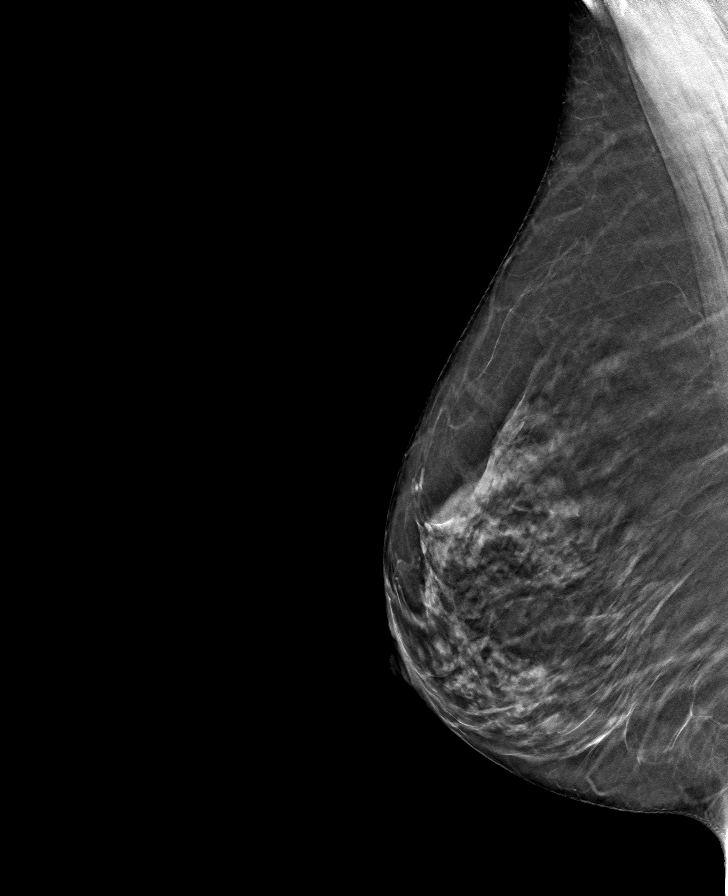

[L MLO tomo · tomo slice 33/65.0]
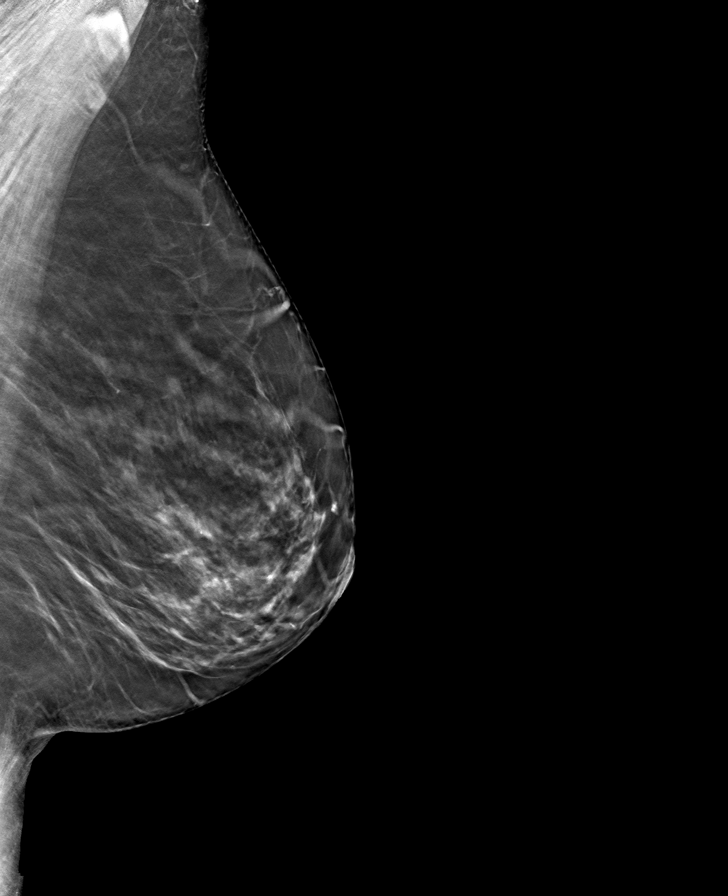

[R CC tomo · tomo slice 29/57.0]
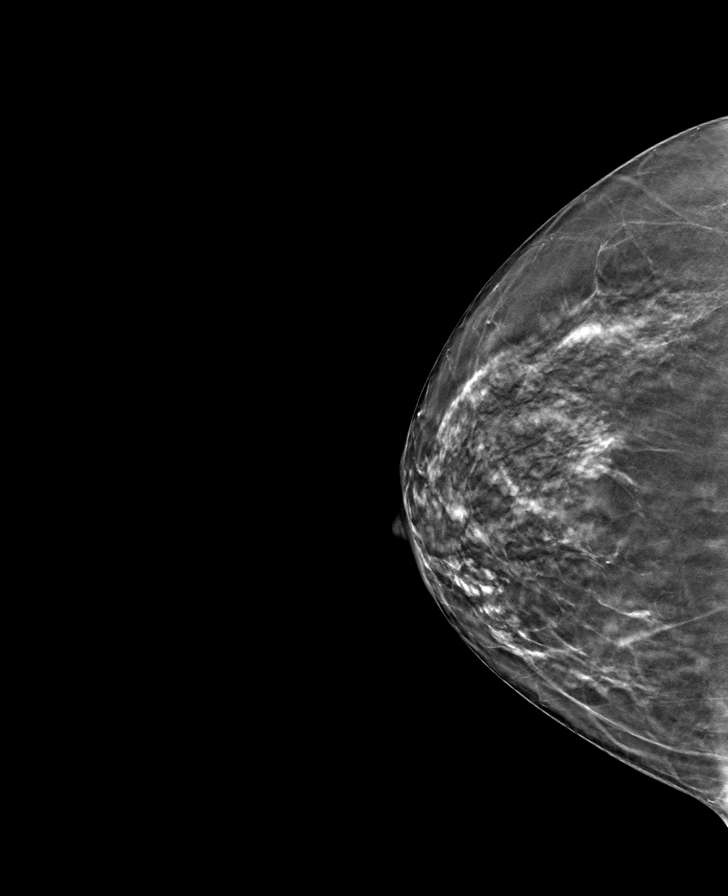

[8 of 24 positions shown; findings below may reference images not displayed]

ACR Breast Density Category b: There are scattered areas of
fibroglandular density.
FINDINGS: There are no findings suspicious for malignancy. Images were
processed with CAD.
IMPRESSION: No mammographic evidence of malignancy. A result letter of this
screening mammogram will be mailed directly to the patient.

RECOMMENDATION:
Screening mammogram in one year. (Code:[id number])

BI-RADS CATEGORY  1: Negative.

## 2020-07-12 ENCOUNTER — Encounter: Payer: Self-pay | Admitting: Registered Nurse

## 2020-07-28 ENCOUNTER — Encounter: Payer: Self-pay | Admitting: Adult Health

## 2020-07-28 ENCOUNTER — Ambulatory Visit (INDEPENDENT_AMBULATORY_CARE_PROVIDER_SITE_OTHER): Payer: 59 | Admitting: Adult Health

## 2020-07-28 ENCOUNTER — Other Ambulatory Visit: Payer: Self-pay

## 2020-07-28 ENCOUNTER — Ambulatory Visit (INDEPENDENT_AMBULATORY_CARE_PROVIDER_SITE_OTHER): Payer: 59

## 2020-07-28 VITALS — BP 118/78 | HR 110 | Temp 98.1°F | Ht 63.0 in | Wt 155.0 lb

## 2020-07-28 DIAGNOSIS — Z01811 Encounter for preprocedural respiratory examination: Secondary | ICD-10-CM | POA: Diagnosis not present

## 2020-07-28 DIAGNOSIS — J42 Unspecified chronic bronchitis: Secondary | ICD-10-CM | POA: Diagnosis not present

## 2020-07-28 DIAGNOSIS — F17200 Nicotine dependence, unspecified, uncomplicated: Secondary | ICD-10-CM | POA: Diagnosis not present

## 2020-07-28 DIAGNOSIS — J441 Chronic obstructive pulmonary disease with (acute) exacerbation: Secondary | ICD-10-CM

## 2020-07-28 LAB — BASIC METABOLIC PANEL
BUN: 10 mg/dL (ref 6–23)
CO2: 31 mEq/L (ref 19–32)
Calcium: 9.7 mg/dL (ref 8.4–10.5)
Chloride: 97 mEq/L (ref 96–112)
Creatinine, Ser: 0.69 mg/dL (ref 0.40–1.20)
GFR: 99.54 mL/min (ref >60.00)
Glucose, Bld: 105 mg/dL — ABNORMAL HIGH (ref 70–99)
Potassium: 3.9 mEq/L (ref 3.5–5.1)
Sodium: 138 mEq/L (ref 135–145)

## 2020-07-28 NOTE — Assessment & Plan Note (Signed)
Moderate stage II COPD and active smoker.  Patient is encouraged on smoking cessation.  Patient appears to be stable.  Chest x-ray today.  Most recent PFTs May of last year.  We will repeat on return visit in 4 months. Due to insurance patient will be changing to [employer]. Check alpha-1 antitrypsin lab work today.  Check be met.  Previous electrolyte panel showed normal bicarb level.  Plan  Patient Instructions  Finish Advair and Incruse , once done than change to Stiolto 2 puffs daily .  Continue on Oxygen 2.5l/m At bedtime   Activity as tolerated.  Labs today  Chest xray today .  Good luck with upcoming surgery .  Work on not smoking  Follow with Dr. Ander Slade in 4 months with PFT -Spirometry w/ DLCO .     -

## 2020-07-28 NOTE — Assessment & Plan Note (Signed)
Preop pulmonary risk assessment-patient has underlying moderate COPD that is oxygen dependent at bedtime With patient's underlying lung disease and oxygen dependence will need to have surgery in the hospital setting. Moderate Risk assessment .  Encourage patient on early mobility as able and increase mucociliary clearance regimen with incentive spirometer continued on bronchodilators and oxygen therapy.  1) RISK FOR PROLONGED MECHANICAL VENTILAION - > 48h  1A) Arozullah - Prolonged mech ventilation risk Arozullah Postperative Pulmonary Risk Score - for mech ventilation dependence >48h [employer], Ann Surg 2000, major non-cardiac surgery) Comment Score  Type of surgery - abd ao aneurysm (27), thoracic (21), neurosurgery / upper abdominal / vascular (21), neck (11) Knee  5  Emergency Surgery - (11) n 0  ALbumin < 3 or poor nutritional state - (9)  0  BUN > 30 -  (8)  0  Partial or completely dependent functional status - (7)  0  COPD -  (6)  6  Age - 60 to 53 (4), > 70  (6)  0  TOTAL    Risk Stratifcation scores  - < 10 (0.5%), 11-19 (1.8%), 20-27 (4.2%), 28-40 (10.1%), >40 (26.6%)  11     Major Pulmonary risks identified in the multifactorial risk analysis are but not limited to a) pneumonia; b) recurrent intubation risk; c) prolonged or recurrent acute respiratory failure needing mechanical ventilation; d) prolonged hospitalization; e) DVT/Pulmonary embolism; f) Acute Pulmonary edema  Recommend 1. Short duration of surgery as much as possible and avoid paralytic if possible . DVT prophylaxis, . Aggressive pulmonary toilet with o2, bronchodilatation, and incentive spirometry and early ambulation

## 2020-07-28 NOTE — Assessment & Plan Note (Signed)
Smoking cessation encouraged!

## 2020-07-28 NOTE — Patient Instructions (Signed)
Finish Advair and Incruse , once done than change to Stiolto 2 puffs daily .  Continue on Oxygen 2.5l/m At bedtime   Activity as tolerated.  Labs today  Chest xray today .  Good luck with upcoming surgery .  Work on not smoking  Follow with Dr. Ander Slade in 4 months with PFT -Spirometry w/ DLCO .

## 2020-07-28 NOTE — Progress Notes (Signed)
 @Patient ID: Vicki [name], female    DOB: 05/19/1965, 54 y.o.   MRN: [id number]  Chief Complaint  Patient presents with  . Follow-up    Referring provider: No ref. provider found  HPI: 54-year-old female active smoker followed for COPD and Chronic respiratory failure on nocturnal oxygen .  Medical history significant for hypertension  TEST/EVENTS :  CT chest 04/08/2019-areas of emphysema, some atelectasis at the right base, some groundglass changes  2D echo May 2021 EF 6065%, right ventricular systolic function is normal.  RV is mildly enlarged.  PFTs Oct 08, 2019 showed moderate airflow obstruction with FEV1 at 56%, ratio 56, FVC 80%, DLCO 64%.  07/28/2020 Follow up : COPD , preop pulmonary risk assessment Patient presents for a follow-up visit.  Last seen May 2021.  Patient has underlying moderate COPD.  She continues to smoke about a pack a day.  Smoking cessation was encouraged.  She remains on Advair and Incruse but has been changed to Stiolto due to insurance issues.  She has several days left of the Advair and Incruse and will finish this up before beginning Stiolto.  She denies any flare of her cough or wheezing.  Has had no recent emergency room visits or hospitalizations for her COPD.  Prior to her knee injury a week ago patient was independent and active.  She did get short of breath with heavy activities.  She remains on oxygen at home 2.5 L at bedtime.  O2 saturations today in the office are at 90% on room air.  She has had no increased oxygen demands recently.  She denies any hemoptysis, chest pain, orthopnea.  She denies any calf pain, syncope or presyncope.  Appetite is been good with no unintentional weight loss.  1 week ago she had an accident with her dog that tore her ACL and meniscus.  She is planning on ACL and meniscus surgery.  But needs pulmonary preop risk assessment.     Allergies  Allergen Reactions  . Jasmine Oil Shortness Of Breath and Swelling   . Levaquin [Levofloxacin] Shortness Of Breath, Itching, Swelling and Rash  . Penicillins Anaphylaxis and Hives    Tolerated ceftriaxone  . Aleve [Naproxen Sodium] Itching  . Ceftin [Cefuroxime Axetil] Other (See Comments)    unsure  . Depo-Provera [Medroxyprogesterone Acetate] Other (See Comments)    Severe acne  . Doxycycline     Raises blood pressure   . Oxycodone Nausea And Vomiting  . Tramadol Other (See Comments)    Kept her up all night    Immunization History  Administered Date(s) Administered  . Influenza Inj Mdck Quad Pf 02/25/2017, 01/09/2018  . Influenza,inj,Quad PF,6+ Mos 02/02/2015, 01/28/2019  . Influenza-Unspecified 02/18/2017, 01/09/2018, 03/23/2020  . PFIZER(Purple Top)SARS-COV-2 Vaccination 08/06/2019, 08/29/2019, 03/22/2020  . Pneumococcal Polysaccharide-23 03/12/2019  . Tdap 05/14/2010, 06/13/2020  . Zoster Recombinat (Shingrix) 03/12/2019    Past Medical History:  Diagnosis Date  . Allergy    seasonal allergies  . Anxiety    on meds  . Arthritis    hands  . Asthma    childhood-current  . Carpal tunnel syndrome    had bilateral sx to tx  . Cataract    bilateral--no sx yet  . COPD (chronic obstructive pulmonary disease) (HCC)   . Depression    on meds  . Diverticulitis   . Emphysema of lung (HCC)    uses MDI  . Essential hypertension, benign 07/14/2019   on meds  . Hyperlipidemia      on meds  . Oxygen deficiency    at night for sleep    Tobacco History: Social History   Tobacco Use  Smoking Status Current Every Day Smoker  . Packs/day: 1.00  . Years: 34.00  . Pack years: 34.00  . Types: Cigarettes  Smokeless Tobacco Never Used  Tobacco Comment   on Chantix   Ready to quit: Not Answered Counseling given: Not Answered Comment: on Chantix   Outpatient Medications Prior to Visit  Medication Sig Dispense Refill  . acetaminophen (TYLENOL) 500 MG tablet Take 1,000 mg by mouth every 6 (six) hours as needed for moderate pain.    .  albuterol (VENTOLIN HFA) 108 (90 Base) MCG/ACT inhaler Inhale 2 puffs into the lungs 2 (two) times daily. 18 g 11  . ALPRAZolam (XANAX) 1 MG tablet Take 1 tablet (1 mg total) by mouth 3 (three) times daily as needed for anxiety. 120 tablet 0  . amLODipine (NORVASC) 5 MG tablet Take 1 tablet (5 mg total) by mouth daily. 200 tablet 0  . aspirin EC 81 MG tablet Take 81 mg by mouth daily.    . cetirizine (ZYRTEC) 10 MG tablet Take 1 tablet (10 mg total) by mouth daily. 30 tablet 11  . citalopram (CELEXA) 20 MG tablet Take 3 tablets (60 mg total) by mouth daily. 270 tablet 2  . eletriptan (RELPAX) 40 MG tablet Take 1 tablet (40 mg total) by mouth See admin instructions. Take 40mg at onset of migraine or headache as needed. May repeat in 2 hours if symptoms persist. 9 tablet 0  . Fluticasone-Salmeterol (ADVAIR DISKUS) 500-50 MCG/DOSE AEPB Inhale 1 puff into the lungs in the morning and at bedtime. 60 each 11  . gabapentin (NEURONTIN) 300 MG capsule Take 1 capsule (300 mg total) by mouth 2 (two) times daily. 180 capsule 3  . INCRUSE ELLIPTA 62.5 MCG/INH AEPB TAKE 1 PUFF BY MOUTH EVERY DAY 30 each 6  . Menthol, Topical Analgesic, (BIOFREEZE EX) Apply 1 application topically daily as needed (back pain).    . omeprazole (PRILOSEC) 20 MG capsule TAKE 1 CAPSULE BY MOUTH TWICE A DAY BEFORE A MEAL (Patient taking differently: Take 20 mg by mouth 2 (two) times daily.) 180 capsule 1  . ondansetron (ZOFRAN) 8 MG tablet Take 8 mg by mouth every 8 (eight) hours as needed for nausea or vomiting.    . pantoprazole (PROTONIX) 40 MG tablet Take 1 tablet (40 mg total) by mouth daily. 90 tablet 3  . prazosin (MINIPRESS) 1 MG capsule TAKE 1 TO 2 CAPSULES (1 TO 2 MG TOTAL) BY MOUTH AT BEDTIME. 180 capsule 2  . promethazine (PHENERGAN) 25 MG tablet Take 1 tablet (25 mg total) by mouth daily. 60 tablet 3  . rOPINIRole (REQUIP) 2 MG tablet Take 0.5 tablets (1 mg total) by mouth at bedtime. 270 tablet 0  . Tetrahydrozoline HCl  (VISINE OP) Place 1 drop into both eyes daily as needed (dry eyes).    . thiamine 100 MG tablet Take 1 tablet (100 mg total) by mouth daily. 30 tablet 0  . Tiotropium Bromide-Olodaterol 2.5-2.5 MCG/ACT AERS Inhale 2 puffs into the lungs daily. 4 g 11  . traZODone (DESYREL) 50 MG tablet TAKE 1 TABLET BY MOUTH EVERYDAY AT BEDTIME (Patient taking differently: Take 50 mg by mouth at bedtime.) 90 tablet 1  . Multiple Vitamin (MULTIVITAMIN WITH MINERALS) TABS tablet Take 1 tablet by mouth daily. (Patient not taking: Reported on 07/28/2020) 30 tablet 0   No facility-administered   medications prior to visit.     Review of Systems:   Constitutional:   No  weight loss, night sweats,  Fevers, chills,  +fatigue, or  lassitude.  HEENT:   No headaches,  Difficulty swallowing,  Tooth/dental problems, or  Sore throat,                No sneezing, itching, ear ache, nasal congestion, post nasal drip,   CV:  No chest pain,  Orthopnea, PND, swelling in lower extremities, anasarca, dizziness, palpitations, syncope.   GI  No heartburn, indigestion, abdominal pain, nausea, vomiting, diarrhea, change in bowel habits, loss of appetite, bloody stools.   Resp:  .  No chest wall deformity  Skin: no rash or lesions.  GU: no dysuria, change in color of urine, no urgency or frequency.  No flank pain, no hematuria   MS:   Right knee injury    Physical Exam  BP 118/78 (BP Location: Right Arm, Cuff Size: Normal)   Pulse (!) 110   Temp 98.1 F (36.7 C)   Ht 5' 3" (1.6 m)   Wt 155 lb (70.3 kg)   SpO2 (!) 85%   BMI 27.46 kg/m   GEN: A/Ox3; pleasant , NAD, in wheelchair   HEENT:  [place]/AT,    NOSE-clear, THROAT-clear, no lesions, no postnasal drip or exudate noted.   NECK:  Supple w/ fair ROM; no JVD; normal carotid impulses w/o bruits; no thyromegaly or nodules palpated; no lymphadenopathy.    RESP  Clear  P & A; w/o, wheezes/ rales/ or rhonchi. no accessory muscle use, no dullness to percussion  CARD:   RRR, no m/r/g, no peripheral edema, pulses intact, no cyanosis or clubbing.  GI:   Soft & nt; nml bowel sounds; no organomegaly or masses detected.   Musco: Warm bil, no deformities or joint swelling noted.  Right knee brace  Neuro: alert, no focal deficits noted.    Skin: Warm, no lesions or rashes    Lab Results:  CBC  BMET  BNP No results found for: BNP  ProBNP No results found for: PROBNP  Imaging: No results found.    PFT Results Latest Ref Rng & Units 10/08/2019  FVC-Pre L 2.87  FVC-Predicted Pre % 81  FVC-Post L 2.83  FVC-Predicted Post % 80  Pre FEV1/FVC % % 55  Post FEV1/FCV % % 56  FEV1-Pre L 1.59  FEV1-Predicted Pre % 57  FEV1-Post L 1.57  DLCO uncorrected ml/min/mmHg 13.53  DLCO UNC% % 64  DLCO corrected ml/min/mmHg 13.07  DLCO COR %Predicted % 62  DLVA Predicted % 67  TLC L 5.94  TLC % Predicted % 117  RV % Predicted % 160    No results found for: NITRICOXIDE      Assessment & Plan:   COPD (chronic obstructive pulmonary disease) (HCC) Moderate stage II COPD and active smoker.  Patient is encouraged on smoking cessation.  Patient appears to be stable.  Chest x-ray today.  Most recent PFTs May of last year.  We will repeat on return visit in 4 months. Due to insurance patient will be changing to [employer]. Check alpha-1 antitrypsin lab work today.  Check be met.  Previous electrolyte panel showed normal bicarb level.  Plan  Patient Instructions  Finish Advair and Incruse , once done than change to Stiolto 2 puffs daily .  Continue on Oxygen 2.5l/m At bedtime   Activity as tolerated.  Labs today  Chest xray today .  Good luck with  upcoming surgery .  Work on not smoking  Follow with Dr. Olalere in 4 months with PFT -Spirometry w/ DLCO .     -  Tobacco use disorder Smoking cessation encouraged  Preop pulmonary/respiratory exam Preop pulmonary risk assessment-patient has underlying moderate COPD that is oxygen dependent at  bedtime With patient's underlying lung disease and oxygen dependence will need to have surgery in the hospital setting. Moderate Risk assessment .  Encourage patient on early mobility as able and increase mucociliary clearance regimen with incentive spirometer continued on bronchodilators and oxygen therapy.  1) RISK FOR PROLONGED MECHANICAL VENTILAION - > 48h  1A) [place] - Prolonged mech ventilation risk [place] Postperative Pulmonary Risk Score - for mech ventilation dependence >48h ([facility], [facility] 2000, major non-cardiac surgery) Comment Score  Type of surgery - abd ao aneurysm (27), thoracic (21), neurosurgery / upper abdominal / vascular (21), neck (11) Knee  5  Emergency Surgery - (11) n 0  ALbumin < 3 or poor nutritional state - (9)  0  BUN > 30 -  (8)  0  Partial or completely dependent functional status - (7)  0  COPD -  (6)  6  Age - 60 to 69 (4), > 70  (6)  0  TOTAL    Risk Stratifcation scores  - < 10 (0.5%), 11-19 (1.8%), 20-27 (4.2%), 28-40 (10.1%), >40 (26.6%)  11     Major Pulmonary risks identified in the multifactorial risk analysis are but not limited to a) pneumonia; b) recurrent intubation risk; c) prolonged or recurrent acute respiratory failure needing mechanical ventilation; d) prolonged hospitalization; e) DVT/Pulmonary embolism; f) Acute Pulmonary edema  Recommend 1. Short duration of surgery as much as possible and avoid paralytic if possible . DVT prophylaxis, . Aggressive pulmonary toilet with o2, bronchodilatation, and incentive spirometry and early ambulation         Tammy Parrett, NP 07/28/2020  

## 2020-08-02 NOTE — Progress Notes (Signed)
Called and spoke with patient regarding labs and cxr results per Rexene Edison NP, she verbalized understanding.  Nothing further needed.

## 2020-08-02 NOTE — Progress Notes (Signed)
Called and spoke with patient, advised of results of cxr per Rexene Edison NP, she verbalized understanding.  Nothing further needed.

## 2020-08-05 LAB — ALPHA-1 ANTITRYPSIN PHENOTYPE: A-1 Antitrypsin, Ser: 130 mg/dL (ref 83–199)

## 2020-08-08 ENCOUNTER — Telehealth: Payer: Self-pay | Admitting: Registered Nurse

## 2020-08-08 NOTE — Telephone Encounter (Signed)
Pt's husband called in asking for if the surgical clearance form from [facility] and [facility] was received? If so they could like to know if this can be completed or does pt need an appt. Please advise and call husband at  [phone]

## 2020-08-08 NOTE — Telephone Encounter (Signed)
I have placed the form in the bin upfront with a charge sheet.

## 2020-08-09 NOTE — Telephone Encounter (Signed)
I have received paperwork and put it in the sign box for rich.

## 2020-08-09 NOTE — Progress Notes (Signed)
Called and left message on voicemail to please return phone call to go over lab result. Contact number provided.

## 2020-08-10 ENCOUNTER — Telehealth: Payer: Self-pay | Admitting: Pulmonary Disease

## 2020-08-10 NOTE — Progress Notes (Signed)
Called and spoke with patient, provided results per Rexene Edison NP.  She had a question regarding rib fx.  Clarified for patient.  Advised her that addendum for surgical clearance has been completed and we will make sure her surgeon receives the OV notes.  She verbalized understanding.  Nothing further needed.

## 2020-08-10 NOTE — Telephone Encounter (Signed)
See results note. 

## 2020-08-16 ENCOUNTER — Other Ambulatory Visit: Payer: Self-pay | Admitting: Registered Nurse

## 2020-08-16 NOTE — Telephone Encounter (Signed)
Pt's husband called today checking on this, he wanted to know if this can be done today and faxed back? The office will not schedule  the surgery until this form is completed and faxed back. Please advise

## 2020-08-16 NOTE — Telephone Encounter (Signed)
LM for pt to call back to schedule an surgery clearance. We can work pt in at a cpe or a hosp f/up spot

## 2020-08-18 ENCOUNTER — Encounter: Payer: Self-pay | Admitting: Registered Nurse

## 2020-08-18 ENCOUNTER — Other Ambulatory Visit: Payer: Self-pay

## 2020-08-18 ENCOUNTER — Ambulatory Visit: Payer: 59 | Admitting: Registered Nurse

## 2020-08-18 VITALS — BP 138/90 | HR 115 | Temp 98.4°F | Ht 63.0 in | Wt 155.0 lb

## 2020-08-18 DIAGNOSIS — Z01818 Encounter for other preprocedural examination: Secondary | ICD-10-CM | POA: Diagnosis not present

## 2020-08-18 LAB — CBC WITH DIFFERENTIAL/PLATELET
Basophils Absolute: 0 10*3/uL (ref 0.0–0.1)
Basophils Relative: 0.6 % (ref 0.0–3.0)
Eosinophils Absolute: 0.3 10*3/uL (ref 0.0–0.7)
Eosinophils Relative: 3.4 % (ref 0.0–5.0)
HCT: 49.6 % — ABNORMAL HIGH (ref 36.0–46.0)
Hemoglobin: 16.7 g/dL — ABNORMAL HIGH (ref 12.0–15.0)
Lymphocytes Relative: 24.8 % (ref 12.0–46.0)
Lymphs Abs: 1.9 10*3/uL (ref 0.7–4.0)
MCHC: 33.6 g/dL (ref 30.0–36.0)
MCV: 95.4 fl (ref 78.0–100.0)
Monocytes Absolute: 0.7 10*3/uL (ref 0.1–1.0)
Monocytes Relative: 9.2 % (ref 3.0–12.0)
Neutro Abs: 4.8 10*3/uL (ref 1.4–7.7)
Neutrophils Relative %: 62 % (ref 43.0–77.0)
Platelets: 207 10*3/uL (ref 150.0–400.0)
RBC: 5.2 Mil/uL — ABNORMAL HIGH (ref 3.87–5.11)
RDW: 14.2 % (ref 11.5–15.5)
WBC: 7.8 10*3/uL (ref 4.0–10.5)

## 2020-08-18 LAB — HEMOGLOBIN A1C: Hgb A1c MFr Bld: 5.4 % (ref 4.6–6.5)

## 2020-08-18 LAB — COMPREHENSIVE METABOLIC PANEL
ALT: 16 U/L (ref 0–35)
AST: 40 U/L — ABNORMAL HIGH (ref 0–37)
Albumin: 4.4 g/dL (ref 3.5–5.2)
Alkaline Phosphatase: 130 U/L — ABNORMAL HIGH (ref 39–117)
BUN: 10 mg/dL (ref 6–23)
CO2: 31 mEq/L (ref 19–32)
Calcium: 10.1 mg/dL (ref 8.4–10.5)
Chloride: 100 mEq/L (ref 96–112)
Creatinine, Ser: 0.7 mg/dL (ref 0.40–1.20)
GFR: 99.15 mL/min (ref >60.00)
Glucose, Bld: 98 mg/dL (ref 70–99)
Potassium: 3.8 mEq/L (ref 3.5–5.1)
Sodium: 137 mEq/L (ref 135–145)
Total Bilirubin: 0.7 mg/dL (ref 0.2–1.2)
Total Protein: 7.8 g/dL (ref 6.0–8.3)

## 2020-08-18 LAB — URINALYSIS
Hgb urine dipstick: NEGATIVE
Ketones, ur: NEGATIVE
Leukocytes,Ua: NEGATIVE
Nitrite: NEGATIVE
Specific Gravity, Urine: 1.02 (ref 1.000–1.030)
Total Protein, Urine: NEGATIVE
Urine Glucose: NEGATIVE
Urobilinogen, UA: 0.2 (ref 0.0–1.0)
pH: 6.5 (ref 5.0–8.0)

## 2020-08-18 LAB — PROTIME-INR
INR: 1 ratio (ref 0.8–1.0)
Prothrombin Time: 10.9 s (ref 9.6–13.1)

## 2020-08-18 NOTE — Progress Notes (Signed)
Established Patient Office Visit  Subjective:  Patient ID: Vicki [name], female    DOB: 04-24-1966  Age: 55 y.o. MRN: [id number]  CC:  Chief Complaint  Patient presents with  . Surgical Clearance    Pt is here for clearance today. She is wanting surgery for right knee.    HPI [name] presents for preoperative clearance  Unfortunately fell and tore ACL and meniscus. Has upcoming arthroscopy and repair. Did same injury to L knee around 20 years ago, this time is R knee.  Histories reviewed. No acute concerns  Has received clearance from her pulmonologist.  Past Medical History:  Diagnosis Date  . Allergy    seasonal allergies  . Anxiety    on meds  . Arthritis    hands  . Asthma    childhood-current  . Carpal tunnel syndrome    had bilateral sx to tx  . Cataract    bilateral--no sx yet  . COPD (chronic obstructive pulmonary disease) ([place])   . Depression    on meds  . Diverticulitis   . Emphysema of lung ([place])    uses MDI  . Essential hypertension, benign 07/14/2019   on meds  . Hyperlipidemia    on meds  . Oxygen deficiency    at night for sleep    Past Surgical History:  Procedure Laterality Date  . ABDOMINAL HYSTERECTOMY  2009  . APPENDECTOMY    . ARTHROSCOPIC REPAIR ACL Left 2011/2014   x 2  . BIOPSY  01/21/2020   Procedure: BIOPSY;  Surgeon: Mauri Pole, MD;  Location: [facility];  Service: Endoscopy;;  EGD and COLON  . BUNIONECTOMY  2006   both  . CARPAL TUNNEL RELEASE Bilateral 10/28/2013   Procedure: BILATERAL CARPAL TUNNEL RELEASE;  Surgeon: Wynonia Sours, MD;  Location: [facility];  Service: Orthopedics;  Laterality: Bilateral;  . CHOLECYSTECTOMY  1994  . COLONOSCOPY WITH PROPOFOL N/A 01/21/2020   Procedure: COLONOSCOPY WITH PROPOFOL;  Surgeon: Mauri Pole, MD;  Location: [facility];  Service: Endoscopy;  Laterality: N/A;  . DILATION AND CURETTAGE OF UTERUS    . ESOPHAGOGASTRODUODENOSCOPY (EGD)  WITH PROPOFOL N/A 01/21/2020   Procedure: ESOPHAGOGASTRODUODENOSCOPY (EGD) WITH PROPOFOL;  Surgeon: Mauri Pole, MD;  Location: [facility];  Service: Endoscopy;  Laterality: N/A;  . POLYPECTOMY  01/21/2020   Procedure: POLYPECTOMY;  Surgeon: Mauri Pole, MD;  Location: [facility];  Service: Endoscopy;;  . TUBAL LIGATION  2004    Family History  Problem Relation Age of Onset  . Brain cancer Mother 61  . Lung cancer Mother 88  . Breast cancer Sister 80  . Ovarian cancer Sister 69  . Colon cancer Neg Hx   . Colon polyps Neg Hx   . Esophageal cancer Neg Hx   . Rectal cancer Neg Hx   . Stomach cancer Neg Hx     Social History   Socioeconomic History  . Marital status: Married    Spouse name: [name]  . Number of children: 3  . Years of education: 12th grade  . Highest education level: Not on file  Occupational History  . Occupation: Airline pilot: [employer]  Tobacco Use  . Smoking status: Current Every Day Smoker    Packs/day: 1.00    Years: 34.00    Pack years: 34.00    Types: Cigarettes  . Smokeless tobacco: Never Used  . Tobacco comment: on Chantix  Vaping Use  .  Vaping Use: Never used  Substance and Sexual Activity  . Alcohol use: Yes    Comment: everyday  . Drug use: No  . Sexual activity: Yes  Other Topics Concern  . Not on file  Social History Narrative   Lives with her husband.   Their 3 children live independently.   Social Determinants of Health   Financial Resource Strain: Not on file  Food Insecurity: Not on file  Transportation Needs: Not on file  Physical Activity: Not on file  Stress: Not on file  Social Connections: Not on file  Intimate Partner Violence: Not on file    Outpatient Medications Prior to Visit  Medication Sig Dispense Refill  . acetaminophen (TYLENOL) 500 MG tablet Take 1,000 mg by mouth every 6 (six) hours as needed for moderate pain.    Marland Kitchen albuterol (VENTOLIN HFA) 108 (90 Base) MCG/ACT inhaler  Inhale 2 puffs into the lungs 2 (two) times daily. 18 g 11  . ALPRAZolam (XANAX) 1 MG tablet Take 1 tablet (1 mg total) by mouth 3 (three) times daily as needed for anxiety. 120 tablet 0  . amLODipine (NORVASC) 5 MG tablet Take 1 tablet (5 mg total) by mouth daily. 200 tablet 0  . aspirin EC 81 MG tablet Take 81 mg by mouth daily.    . cetirizine (ZYRTEC) 10 MG tablet Take 1 tablet (10 mg total) by mouth daily. 30 tablet 11  . citalopram (CELEXA) 20 MG tablet Take 3 tablets (60 mg total) by mouth daily. 270 tablet 2  . eletriptan (RELPAX) 40 MG tablet Take 1 tablet (40 mg total) by mouth See admin instructions. Take 40mg  at onset of migraine or headache as needed. May repeat in 2 hours if symptoms persist. 9 tablet 0  . Fluticasone-Salmeterol (ADVAIR DISKUS) 500-50 MCG/DOSE AEPB Inhale 1 puff into the lungs in the morning and at bedtime. 60 each 11  . gabapentin (NEURONTIN) 300 MG capsule Take 1 capsule (300 mg total) by mouth 2 (two) times daily. 180 capsule 3  . INCRUSE ELLIPTA 62.5 MCG/INH AEPB TAKE 1 PUFF BY MOUTH EVERY DAY 30 each 6  . Menthol, Topical Analgesic, (BIOFREEZE EX) Apply 1 application topically daily as needed (back pain).    Marland Kitchen omeprazole (PRILOSEC) 20 MG capsule TAKE 1 CAPSULE BY MOUTH TWICE A DAY BEFORE A MEAL (Patient taking differently: Take 20 mg by mouth 2 (two) times daily.) 180 capsule 1  . ondansetron (ZOFRAN) 8 MG tablet Take 8 mg by mouth every 8 (eight) hours as needed for nausea or vomiting.    . pantoprazole (PROTONIX) 40 MG tablet Take 1 tablet (40 mg total) by mouth daily. 90 tablet 3  . prazosin (MINIPRESS) 1 MG capsule TAKE 1 TO 2 CAPSULES (1 TO 2 MG TOTAL) BY MOUTH AT BEDTIME. 180 capsule 2  . promethazine (PHENERGAN) 25 MG tablet Take 1 tablet (25 mg total) by mouth daily. 60 tablet 3  . rOPINIRole (REQUIP) 2 MG tablet Take 0.5 tablets (1 mg total) by mouth at bedtime. 270 tablet 0  . Tetrahydrozoline HCl (VISINE OP) Place 1 drop into both eyes daily as needed  (dry eyes).    . thiamine 100 MG tablet Take 1 tablet (100 mg total) by mouth daily. 30 tablet 0  . traZODone (DESYREL) 50 MG tablet TAKE 1 TABLET BY MOUTH EVERYDAY AT BEDTIME (Patient taking differently: Take 50 mg by mouth at bedtime.) 90 tablet 1  . HYDROcodone-acetaminophen (NORCO/VICODIN) 5-325 MG tablet Take 1 tablet by mouth every  4 (four) hours as needed. for pain    . Tiotropium Bromide-Olodaterol 2.5-2.5 MCG/ACT AERS Inhale 2 puffs into the lungs daily. (Patient not taking: Reported on 08/18/2020) 4 g 11  . Multiple Vitamin (MULTIVITAMIN WITH MINERALS) TABS tablet Take 1 tablet by mouth daily. (Patient not taking: Reported on 07/28/2020) 30 tablet 0   No facility-administered medications prior to visit.    Allergies  Allergen Reactions  . Jasmine Oil Shortness Of Breath and Swelling  . Levaquin [Levofloxacin] Shortness Of Breath, Itching, Swelling and Rash  . Penicillins Anaphylaxis and Hives    Tolerated ceftriaxone  . Aleve [Naproxen Sodium] Itching  . Ceftin [Cefuroxime Axetil] Other (See Comments)    unsure  . Depo-Provera [Medroxyprogesterone Acetate] Other (See Comments)    Severe acne  . Doxycycline     Raises blood pressure   . Oxycodone Nausea And Vomiting  . Tramadol Other (See Comments)    Kept her up all night    ROS Review of Systems  Constitutional: Negative.   HENT: Negative.   Eyes: Negative.   Respiratory: Negative.   Cardiovascular: Negative.   Gastrointestinal: Negative.   Genitourinary: Negative.   Musculoskeletal: Negative.   Skin: Negative.   Neurological: Negative.   Psychiatric/Behavioral: Negative.   All other systems reviewed and are negative.     Objective:    Physical Exam Vitals and nursing note reviewed.  Constitutional:      General: She is not in acute distress.    Appearance: Normal appearance. She is normal weight. She is not ill-appearing, toxic-appearing or diaphoretic.  Cardiovascular:     Rate and Rhythm: Normal rate  and regular rhythm.     Heart sounds: Normal heart sounds. No murmur heard. No friction rub. No gallop.   Pulmonary:     Effort: Pulmonary effort is normal. No respiratory distress.     Breath sounds: Normal breath sounds. No stridor. No wheezing, rhonchi or rales.  Chest:     Chest wall: No tenderness.  Skin:    General: Skin is warm and dry.  Neurological:     General: No focal deficit present.     Mental Status: She is alert and oriented to person, place, and time. Mental status is at baseline.  Psychiatric:        Mood and Affect: Mood normal.        Behavior: Behavior normal.        Thought Content: Thought content normal.        Judgment: Judgment normal.     BP 138/90 (BP Location: Left Arm, Patient Position: Sitting, Cuff Size: Normal)   Pulse (!) 115   Temp 98.4 F (36.9 C) (Temporal)   Ht 5\' 3"  (1.6 m)   Wt 155 lb (70.3 kg)   SpO2 92%   BMI 27.46 kg/m  Wt Readings from Last 3 Encounters:  08/18/20 155 lb (70.3 kg)  07/28/20 155 lb (70.3 kg)  06/13/20 159 lb 9.6 oz (72.4 kg)     There are no preventive care reminders to display for this patient.  There are no preventive care reminders to display for this patient.  Lab Results  Component Value Date   TSH 3.240 08/17/2019   Lab Results  Component Value Date   WBC 7.8 08/17/2019   HGB 14.6 08/17/2019   HCT 41.1 08/17/2019   MCV 93 08/17/2019   PLT 149 (L) 08/17/2019   Lab Results  Component Value Date   NA 138 07/28/2020   K 3.9  07/28/2020   CO2 31 07/28/2020   GLUCOSE 105 (H) 07/28/2020   BUN 10 07/28/2020   CREATININE 0.69 07/28/2020   BILITOT 1.0 08/17/2019   ALKPHOS 161 (H) 08/17/2019   AST 85 (H) 08/17/2019   ALT 44 (H) 08/17/2019   PROT 7.3 08/17/2019   ALBUMIN 4.6 08/17/2019   CALCIUM 9.7 07/28/2020   ANIONGAP 11 04/11/2019   GFR 99.54 07/28/2020   Lab Results  Component Value Date   CHOL 207 (H) 07/14/2019   Lab Results  Component Value Date   HDL 66 07/14/2019   Lab Results   Component Value Date   LDLCALC 96 07/14/2019   Lab Results  Component Value Date   TRIG 274 (H) 07/14/2019   Lab Results  Component Value Date   CHOLHDL 3.1 07/14/2019   Lab Results  Component Value Date   HGBA1C 5.5 01/17/2018      Assessment & Plan:   Problem List Items Addressed This Visit   None   Visit Diagnoses    Pre-operative clearance    -  Primary   Relevant Orders   EKG 12-Lead (Completed)   CBC with Differential/Platelet   Comprehensive metabolic panel   Hemoglobin A1c   Urinalysis   Protime-INR      No orders of the defined types were placed in this encounter.   Follow-up: No follow-ups on file.   PLAN  EKG compared to April 2021. No acute changes. No concerns at this time.  Labs collected. Will follow up with the patient as warranted.  Pending normal labs, pt cleared for surgery  Patient encouraged to call clinic with any questions, comments, or concerns.  Maximiano Coss, NP

## 2020-08-22 ENCOUNTER — Telehealth: Payer: Self-pay

## 2020-08-22 NOTE — Telephone Encounter (Signed)
Pt husband called asking if the surgical clearance form had been faxed over to [facility] so that pt can schedule her surgery. Pt husband asked for a call back. Please advise.

## 2020-08-22 NOTE — Telephone Encounter (Signed)
LM letting her know sent pre surgical work up to [employer].

## 2020-08-23 ENCOUNTER — Encounter (HOSPITAL_BASED_OUTPATIENT_CLINIC_OR_DEPARTMENT_OTHER): Payer: Self-pay | Admitting: Orthopedic Surgery

## 2020-08-23 ENCOUNTER — Ambulatory Visit: Payer: Self-pay | Admitting: Physician Assistant

## 2020-08-23 ENCOUNTER — Other Ambulatory Visit: Payer: Self-pay

## 2020-08-23 ENCOUNTER — Other Ambulatory Visit (HOSPITAL_COMMUNITY)
Admission: RE | Admit: 2020-08-23 | Discharge: 2020-08-23 | Disposition: A | Discharge status: Home / Self Care | Payer: 59 | Source: Ambulatory Visit | Attending: Orthopedic Surgery | Admitting: Orthopedic Surgery

## 2020-08-23 DIAGNOSIS — Z20822 Contact with and (suspected) exposure to covid-19: Secondary | ICD-10-CM | POA: Insufficient documentation

## 2020-08-23 DIAGNOSIS — Z01812 Encounter for preprocedural laboratory examination: Secondary | ICD-10-CM | POA: Insufficient documentation

## 2020-08-23 LAB — SARS CORONAVIRUS 2 (TAT 6-24 HRS): SARS Coronavirus 2: NEGATIVE

## 2020-08-23 NOTE — H&P (View-Only) (Signed)
Vicki [name] is an 55 y.o. female.   Chief Complaint: right knee pain HPI: She is 54 and fell down the steps. She had a severe pop and catch in her knee. She developed a large hemarthrosis immediately. She also complains of diffuse right lower extremity pain including the hip and tibia. There is 10/10 pain. She states she is not on any chronic medication.   She has complete ACL bucket handle and lateral tear and medial tear.  She is in her 77s.    Past Medical History:  Diagnosis Date  . Allergy    seasonal allergies  . Anxiety    on meds  . Arthritis    hands  . Asthma    childhood-current  . Carpal tunnel syndrome    had bilateral sx to tx  . Cataract    bilateral--no sx yet  . COPD (chronic obstructive pulmonary disease) ([place])   . Depression    on meds  . Diverticulitis   . Emphysema of lung ([place])    uses MDI  . Essential hypertension, benign 07/14/2019   on meds  . Hyperlipidemia    on meds  . Oxygen deficiency    at night for sleep    Past Surgical History:  Procedure Laterality Date  . ABDOMINAL HYSTERECTOMY  2009  . APPENDECTOMY    . ARTHROSCOPIC REPAIR ACL Left 2011/2014   x 2  . BIOPSY  01/21/2020   Procedure: BIOPSY;  Surgeon: Mauri Pole, MD;  [facility]: [facility];  Service: Endoscopy;;  EGD and COLON  . BUNIONECTOMY  2006   both  . CARPAL TUNNEL RELEASE Bilateral 10/28/2013   Procedure: BILATERAL CARPAL TUNNEL RELEASE;  Surgeon: Wynonia Sours, MD;  Location: [place];  Service: Orthopedics;  Laterality: Bilateral;  . CHOLECYSTECTOMY  1994  . COLONOSCOPY WITH PROPOFOL N/A 01/21/2020   Procedure: COLONOSCOPY WITH PROPOFOL;  Surgeon: Mauri Pole, MD;  [facility];  Service: Endoscopy;  Laterality: N/A;  . DILATION AND CURETTAGE OF UTERUS    . ESOPHAGOGASTRODUODENOSCOPY (EGD) WITH PROPOFOL N/A 01/21/2020   Procedure: ESOPHAGOGASTRODUODENOSCOPY (EGD) WITH PROPOFOL;  Surgeon: Mauri Pole, MD;  [facility];  Service: Endoscopy;  Laterality: N/A;  . POLYPECTOMY  01/21/2020   Procedure: POLYPECTOMY;  Surgeon: Mauri Pole, MD;  [facility];  Service: Endoscopy;;  . TUBAL LIGATION  2004    Family History  Problem Relation Age of Onset  . Brain cancer Mother 74  . Lung cancer Mother 42  . Breast cancer Sister 60  . Ovarian cancer Sister 32  . Colon cancer Neg Hx   . Colon polyps Neg Hx   . Esophageal cancer Neg Hx   . Rectal cancer Neg Hx   . Stomach cancer Neg Hx    Social History:  reports that she has been smoking cigarettes. She has a 34.00 pack-year smoking history. She has never used smokeless tobacco. She reports current alcohol use. She reports that she does not use drugs.  Allergies:  Allergies  Allergen Reactions  . Jasmine Oil Shortness Of Breath and Swelling  . Levaquin [Levofloxacin] Shortness Of Breath, Itching, Swelling and Rash  . Penicillins Anaphylaxis and Hives    Tolerated ceftriaxone  . Aleve [Naproxen Sodium] Itching  . Ceftin [Cefuroxime Axetil] Other (See Comments)    unsure  . Depo-Provera [Medroxyprogesterone Acetate] Other (See Comments)    Severe acne  . Doxycycline     Raises blood pressure   .  Oxycodone Nausea And Vomiting  . Tramadol Other (See Comments)    Kept her up all night    (Not in a hospital admission)   No results found for this or any previous visit (from the past 48 hour(s)). No results found.  Review of Systems  Respiratory: Positive for shortness of breath.   Musculoskeletal: Positive for arthralgias and joint swelling.  All other systems reviewed and are negative.   There were no vitals taken for this visit. Physical Exam Constitutional:      General: She is not in acute distress.    Appearance: Normal appearance.  HENT:     Head: Normocephalic and atraumatic.  Eyes:     Extraocular Movements: Extraocular movements intact.     Pupils: Pupils are equal, round, and reactive to light.   Cardiovascular:     Rate and Rhythm: Normal rate.     Pulses: Normal pulses.  Pulmonary:     Effort: Pulmonary effort is normal. No respiratory distress.  Abdominal:     General: Abdomen is flat. There is no distension.  Musculoskeletal:     Cervical back: Normal range of motion.     Right knee: Swelling and bony tenderness present. Tenderness present. ACL laxity present.     Instability Tests: Anterior drawer test positive. Anterior Lachman test positive.  Skin:    General: Skin is warm and dry.     Findings: No erythema or rash.  Neurological:     General: No focal deficit present.     Mental Status: She is alert and oriented to person, place, and time.  Psychiatric:        Mood and Affect: Mood normal.        Behavior: Behavior normal.      Assessment/Plan Right knee complete ACL bucket handle and lateral tear and medial tear  She is given the option for meniscectomies with/without reconstruction.  She had previous reconstruction on her opposite side.  I do not think this is certainly out of the question to do a right knee meniscotomy, medial and lateral, with reconstruction although we discussed in detail today the need for a graft length allograft, particularly given her age.  This is complicated by the fact that she has COPD and is on home O2.  She continues to smoke and is on aspirin.  We will need to get some advice through her medical team.  It is unclear to me whether she could be done.  I would advise this at least towards an overnight stay; whether that would be done in the hospital vs outpatient center I would obviously defer to her medical team and/or anesthesia judgement on that.  We could defer to the hospital if we had to.  At this point we will await some decision making regarding that.    Chriss Czar, PA-C 08/23/2020, 12:32 PM

## 2020-08-23 NOTE — H&P (Signed)
Vicki [name] is an 55 y.o. female.   Chief Complaint: right knee pain HPI: She is 54 and fell down the steps. She had a severe pop and catch in her knee. She developed a large hemarthrosis immediately. She also complains of diffuse right lower extremity pain including the hip and tibia. There is 10/10 pain. She states she is not on any chronic medication.   She has complete ACL bucket handle and lateral tear and medial tear.  She is in her 59s.    Past Medical History:  Diagnosis Date  . Allergy    seasonal allergies  . Anxiety    on meds  . Arthritis    hands  . Asthma    childhood-current  . Carpal tunnel syndrome    had bilateral sx to tx  . Cataract    bilateral--no sx yet  . COPD (chronic obstructive pulmonary disease) ([place])   . Depression    on meds  . Diverticulitis   . Emphysema of lung ([place])    uses MDI  . Essential hypertension, benign 07/14/2019   on meds  . Hyperlipidemia    on meds  . Oxygen deficiency    at night for sleep    Past Surgical History:  Procedure Laterality Date  . ABDOMINAL HYSTERECTOMY  2009  . APPENDECTOMY    . ARTHROSCOPIC REPAIR ACL Left 2011/2014   x 2  . BIOPSY  01/21/2020   Procedure: BIOPSY;  Surgeon: Mauri Pole, MD;  [facility];  Service: Endoscopy;;  EGD and COLON  . BUNIONECTOMY  2006   both  . CARPAL TUNNEL RELEASE Bilateral 10/28/2013   Procedure: BILATERAL CARPAL TUNNEL RELEASE;  Surgeon: Wynonia Sours, MD;  Location: [place];  Service: Orthopedics;  Laterality: Bilateral;  . CHOLECYSTECTOMY  1994  . COLONOSCOPY WITH PROPOFOL N/A 01/21/2020   Procedure: COLONOSCOPY WITH PROPOFOL;  Surgeon: Mauri Pole, MD;  [facility];  Service: Endoscopy;  Laterality: N/A;  . DILATION AND CURETTAGE OF UTERUS    . ESOPHAGOGASTRODUODENOSCOPY (EGD) WITH PROPOFOL N/A 01/21/2020   Procedure: ESOPHAGOGASTRODUODENOSCOPY (EGD) WITH PROPOFOL;  Surgeon: Mauri Pole, MD;  [facility];  Service: Endoscopy;  Laterality: N/A;  . POLYPECTOMY  01/21/2020   Procedure: POLYPECTOMY;  Surgeon: Mauri Pole, MD;  [facility];  Service: Endoscopy;;  . TUBAL LIGATION  2004    Family History  Problem Relation Age of Onset  . Brain cancer Mother 62  . Lung cancer Mother 3  . Breast cancer Sister 68  . Ovarian cancer Sister 12  . Colon cancer Neg Hx   . Colon polyps Neg Hx   . Esophageal cancer Neg Hx   . Rectal cancer Neg Hx   . Stomach cancer Neg Hx    Social History:  reports that she has been smoking cigarettes. She has a 34.00 pack-year smoking history. She has never used smokeless tobacco. She reports current alcohol use. She reports that she does not use drugs.  Allergies:  Allergies  Allergen Reactions  . Jasmine Oil Shortness Of Breath and Swelling  . Levaquin [Levofloxacin] Shortness Of Breath, Itching, Swelling and Rash  . Penicillins Anaphylaxis and Hives    Tolerated ceftriaxone  . Aleve [Naproxen Sodium] Itching  . Ceftin [Cefuroxime Axetil] Other (See Comments)    unsure  . Depo-Provera [Medroxyprogesterone Acetate] Other (See Comments)    Severe acne  . Doxycycline     Raises blood pressure   .  Oxycodone Nausea And Vomiting  . Tramadol Other (See Comments)    Kept her up all night    (Not in a hospital admission)   No results found for this or any previous visit (from the past 48 hour(s)). No results found.  Review of Systems  Respiratory: Positive for shortness of breath.   Musculoskeletal: Positive for arthralgias and joint swelling.  All other systems reviewed and are negative.   There were no vitals taken for this visit. Physical Exam Constitutional:      General: She is not in acute distress.    Appearance: Normal appearance.  HENT:     Head: Normocephalic and atraumatic.  Eyes:     Extraocular Movements: Extraocular movements intact.     Pupils: Pupils are equal, round, and reactive to light.   Cardiovascular:     Rate and Rhythm: Normal rate.     Pulses: Normal pulses.  Pulmonary:     Effort: Pulmonary effort is normal. No respiratory distress.  Abdominal:     General: Abdomen is flat. There is no distension.  Musculoskeletal:     Cervical back: Normal range of motion.     Right knee: Swelling and bony tenderness present. Tenderness present. ACL laxity present.     Instability Tests: Anterior drawer test positive. Anterior Lachman test positive.  Skin:    General: Skin is warm and dry.     Findings: No erythema or rash.  Neurological:     General: No focal deficit present.     Mental Status: She is alert and oriented to person, place, and time.  Psychiatric:        Mood and Affect: Mood normal.        Behavior: Behavior normal.      Assessment/Plan Right knee complete ACL bucket handle and lateral tear and medial tear  She is given the option for meniscectomies with/without reconstruction.  She had previous reconstruction on her opposite side.  I do not think this is certainly out of the question to do a right knee meniscotomy, medial and lateral, with reconstruction although we discussed in detail today the need for a graft length allograft, particularly given her age.  This is complicated by the fact that she has COPD and is on home O2.  She continues to smoke and is on aspirin.  We will need to get some advice through her medical team.  It is unclear to me whether she could be done.  I would advise this at least towards an overnight [name]; whether that would be done in the hospital vs outpatient center I would obviously defer to her medical team and/or anesthesia judgement on that.  We could defer to the hospital if we had to.  At this point we will await some decision making regarding that.    Chriss Czar, PA-C 08/23/2020, 12:32 PM

## 2020-08-24 ENCOUNTER — Other Ambulatory Visit: Payer: Self-pay

## 2020-08-24 ENCOUNTER — Encounter (HOSPITAL_BASED_OUTPATIENT_CLINIC_OR_DEPARTMENT_OTHER): Payer: Self-pay | Admitting: Orthopedic Surgery

## 2020-08-24 NOTE — Progress Notes (Signed)
Patient with Hx COPD, asthma and is on home O2 at 2.5L/[place] at night. Pulmonary notes reviewed with Dr Lanetta Inch, [place] for [facility], plan [place] stay.

## 2020-08-29 ENCOUNTER — Other Ambulatory Visit (HOSPITAL_COMMUNITY)
Admission: RE | Admit: 2020-08-29 | Discharge: 2020-08-29 | Disposition: A | Discharge status: Home / Self Care | Payer: 59 | Source: Ambulatory Visit | Attending: Orthopedic Surgery | Admitting: Orthopedic Surgery

## 2020-08-29 DIAGNOSIS — Z20822 Contact with and (suspected) exposure to covid-19: Secondary | ICD-10-CM | POA: Insufficient documentation

## 2020-08-29 DIAGNOSIS — Z01812 Encounter for preprocedural laboratory examination: Secondary | ICD-10-CM | POA: Diagnosis present

## 2020-08-29 LAB — SARS CORONAVIRUS 2 (TAT 6-24 HRS): SARS Coronavirus 2: NEGATIVE

## 2020-08-31 ENCOUNTER — Ambulatory Visit (HOSPITAL_BASED_OUTPATIENT_CLINIC_OR_DEPARTMENT_OTHER): Payer: 59 | Admitting: Anesthesiology

## 2020-08-31 ENCOUNTER — Encounter (HOSPITAL_BASED_OUTPATIENT_CLINIC_OR_DEPARTMENT_OTHER)
Admission: RE | Disposition: A | Discharge status: Home / Self Care | Payer: Self-pay | Source: Home / Self Care | Attending: Orthopedic Surgery

## 2020-08-31 ENCOUNTER — Encounter (HOSPITAL_BASED_OUTPATIENT_CLINIC_OR_DEPARTMENT_OTHER): Payer: Self-pay | Admitting: Orthopedic Surgery

## 2020-08-31 ENCOUNTER — Other Ambulatory Visit: Payer: Self-pay

## 2020-08-31 ENCOUNTER — Ambulatory Visit (HOSPITAL_BASED_OUTPATIENT_CLINIC_OR_DEPARTMENT_OTHER)
Admission: RE | Admit: 2020-08-31 | Discharge: 2020-09-01 | Disposition: A | Discharge status: Home / Self Care | Payer: 59 | Attending: Orthopedic Surgery | Admitting: Orthopedic Surgery

## 2020-08-31 DIAGNOSIS — Z7982 Long term (current) use of aspirin: Secondary | ICD-10-CM | POA: Insufficient documentation

## 2020-08-31 DIAGNOSIS — Z880 Allergy status to penicillin: Secondary | ICD-10-CM | POA: Insufficient documentation

## 2020-08-31 DIAGNOSIS — F17210 Nicotine dependence, cigarettes, uncomplicated: Secondary | ICD-10-CM | POA: Insufficient documentation

## 2020-08-31 DIAGNOSIS — S83271A Complex tear of lateral meniscus, current injury, right knee, initial encounter: Secondary | ICD-10-CM | POA: Insufficient documentation

## 2020-08-31 DIAGNOSIS — S83519A Sprain of anterior cruciate ligament of unspecified knee, initial encounter: Secondary | ICD-10-CM | POA: Diagnosis present

## 2020-08-31 DIAGNOSIS — Z888 Allergy status to other drugs, medicaments and biological substances status: Secondary | ICD-10-CM | POA: Diagnosis not present

## 2020-08-31 DIAGNOSIS — W109XXA Fall (on) (from) unspecified stairs and steps, initial encounter: Secondary | ICD-10-CM | POA: Diagnosis not present

## 2020-08-31 DIAGNOSIS — Z885 Allergy status to narcotic agent status: Secondary | ICD-10-CM | POA: Diagnosis not present

## 2020-08-31 DIAGNOSIS — J449 Chronic obstructive pulmonary disease, unspecified: Secondary | ICD-10-CM | POA: Diagnosis not present

## 2020-08-31 DIAGNOSIS — S83511A Sprain of anterior cruciate ligament of right knee, initial encounter: Secondary | ICD-10-CM | POA: Insufficient documentation

## 2020-08-31 DIAGNOSIS — Z881 Allergy status to other antibiotic agents status: Secondary | ICD-10-CM | POA: Insufficient documentation

## 2020-08-31 DIAGNOSIS — Z9981 Dependence on supplemental oxygen: Secondary | ICD-10-CM | POA: Diagnosis not present

## 2020-08-31 DIAGNOSIS — Z886 Allergy status to analgesic agent status: Secondary | ICD-10-CM | POA: Insufficient documentation

## 2020-08-31 DIAGNOSIS — S83231A Complex tear of medial meniscus, current injury, right knee, initial encounter: Secondary | ICD-10-CM | POA: Diagnosis not present

## 2020-08-31 HISTORY — DX: Dependence on supplemental oxygen: Z99.81

## 2020-08-31 HISTORY — PX: KNEE ARTHROSCOPY WITH ANTERIOR CRUCIATE LIGAMENT (ACL) REPAIR: SHX5644

## 2020-08-31 HISTORY — PX: CHONDROPLASTY: SHX5177

## 2020-08-31 SURGERY — KNEE ARTHROSCOPY WITH ANTERIOR CRUCIATE LIGAMENT (ACL) REPAIR
Anesthesia: General | Site: Knee | Laterality: Right

## 2020-08-31 MED ORDER — ALBUTEROL SULFATE (2.5 MG/3ML) 0.083% IN NEBU
2.5000 mg | INHALATION_SOLUTION | Freq: Four times a day (QID) | RESPIRATORY_TRACT | Status: DC | PRN
  Administered 2020-08-31: 2.5 mg via RESPIRATORY_TRACT

## 2020-08-31 MED ORDER — ONDANSETRON HCL 4 MG/2ML IJ SOLN
INTRAMUSCULAR | Status: AC
  Filled 2020-08-31: qty 2

## 2020-08-31 MED ORDER — ACETAMINOPHEN 325 MG PO TABS: 325.0000 mg | ORAL_TABLET | Freq: Four times a day (QID) | ORAL | Status: DC | PRN

## 2020-08-31 MED ORDER — FENTANYL CITRATE (PF) 100 MCG/2ML IJ SOLN
INTRAMUSCULAR | Status: AC
  Filled 2020-08-31: qty 2

## 2020-08-31 MED ORDER — ASPIRIN EC 81 MG PO TBEC: 81.0000 mg | DELAYED_RELEASE_TABLET | Freq: Two times a day (BID) | ORAL | 0 refills | Status: DC

## 2020-08-31 MED ORDER — ALPRAZOLAM 0.25 MG PO TABS
1.0000 mg | ORAL_TABLET | Freq: Three times a day (TID) | ORAL | Status: DC | PRN
  Administered 2020-08-31: 1 mg via ORAL
  Filled 2020-08-31: qty 4

## 2020-08-31 MED ORDER — MIDAZOLAM HCL 2 MG/2ML IJ SOLN
2.0000 mg | Freq: Once | INTRAMUSCULAR | Status: AC
  Administered 2020-08-31: 2 mg via INTRAVENOUS

## 2020-08-31 MED ORDER — FENTANYL CITRATE (PF) 100 MCG/2ML IJ SOLN
100.0000 ug | Freq: Once | INTRAMUSCULAR | Status: AC
Start: 2020-08-31 — End: 2020-08-31
  Administered 2020-08-31: 100 ug via INTRAVENOUS

## 2020-08-31 MED ORDER — CELECOXIB 200 MG PO CAPS: 200.0000 mg | ORAL_CAPSULE | Freq: Two times a day (BID) | ORAL | 0 refills | Status: DC

## 2020-08-31 MED ORDER — SODIUM CHLORIDE 0.9 % IV SOLN: INTRAVENOUS | Status: DC

## 2020-08-31 MED ORDER — DEXAMETHASONE SODIUM PHOSPHATE 10 MG/ML IJ SOLN
INTRAMUSCULAR | Status: AC
  Filled 2020-08-31: qty 1

## 2020-08-31 MED ORDER — ALBUTEROL SULFATE HFA 108 (90 BASE) MCG/ACT IN AERS
INHALATION_SPRAY | RESPIRATORY_TRACT | Status: DC | PRN
  Administered 2020-08-31: 2 via RESPIRATORY_TRACT

## 2020-08-31 MED ORDER — ONDANSETRON HCL 4 MG PO TABS: 8.0000 mg | ORAL_TABLET | Freq: Three times a day (TID) | ORAL | Status: DC | PRN

## 2020-08-31 MED ORDER — PRAZOSIN HCL 1 MG PO CAPS
1.0000 mg | ORAL_CAPSULE | Freq: Every day | ORAL | Status: DC
  Filled 2020-08-31: qty 1

## 2020-08-31 MED ORDER — DEXAMETHASONE SODIUM PHOSPHATE 4 MG/ML IJ SOLN
INTRAMUSCULAR | Status: DC | PRN
  Administered 2020-08-31: 8 mg via INTRAVENOUS

## 2020-08-31 MED ORDER — PANTOPRAZOLE SODIUM 40 MG PO TBEC: 40.0000 mg | DELAYED_RELEASE_TABLET | Freq: Every day | ORAL | Status: DC

## 2020-08-31 MED ORDER — MIDAZOLAM HCL 2 MG/2ML IJ SOLN
INTRAMUSCULAR | Status: AC
  Filled 2020-08-31: qty 2

## 2020-08-31 MED ORDER — ONDANSETRON HCL 4 MG/2ML IJ SOLN
INTRAMUSCULAR | Status: DC | PRN
  Administered 2020-08-31: 4 mg via INTRAVENOUS

## 2020-08-31 MED ORDER — ALBUTEROL SULFATE (2.5 MG/3ML) 0.083% IN NEBU
INHALATION_SOLUTION | RESPIRATORY_TRACT | Status: AC
  Filled 2020-08-31: qty 3

## 2020-08-31 MED ORDER — PROMETHAZINE HCL 25 MG/ML IJ SOLN: 6.2500 mg | INTRAMUSCULAR | Status: DC | PRN

## 2020-08-31 MED ORDER — PROPOFOL 10 MG/ML IV BOLUS
INTRAVENOUS | Status: DC | PRN
  Administered 2020-08-31: 200 mg via INTRAVENOUS

## 2020-08-31 MED ORDER — CITALOPRAM HYDROBROMIDE 40 MG PO TABS: 60.0000 mg | ORAL_TABLET | Freq: Every day | ORAL | Status: DC

## 2020-08-31 MED ORDER — LIDOCAINE HCL (CARDIAC) PF 100 MG/5ML IV SOSY
PREFILLED_SYRINGE | INTRAVENOUS | Status: DC | PRN
  Administered 2020-08-31: 60 mg via INTRAVENOUS

## 2020-08-31 MED ORDER — MEPERIDINE HCL 25 MG/ML IJ SOLN: 6.2500 mg | INTRAMUSCULAR | Status: DC | PRN

## 2020-08-31 MED ORDER — CLINDAMYCIN PHOSPHATE 900 MG/50ML IV SOLN
INTRAVENOUS | Status: AC
  Filled 2020-08-31: qty 50

## 2020-08-31 MED ORDER — ROPINIROLE HCL 1 MG PO TABS
2.0000 mg | ORAL_TABLET | Freq: Every day | ORAL | Status: DC
  Filled 2020-08-31: qty 2

## 2020-08-31 MED ORDER — AMLODIPINE BESYLATE 5 MG PO TABS: 5.0000 mg | ORAL_TABLET | Freq: Every day | ORAL | Status: DC

## 2020-08-31 MED ORDER — GABAPENTIN 300 MG PO CAPS
300.0000 mg | ORAL_CAPSULE | Freq: Two times a day (BID) | ORAL | Status: DC
  Administered 2020-08-31: 300 mg via ORAL
  Filled 2020-08-31: qty 1

## 2020-08-31 MED ORDER — SODIUM CHLORIDE 0.9 % IR SOLN
Status: DC | PRN
  Administered 2020-08-31: 12000 mL

## 2020-08-31 MED ORDER — HYDROCODONE-ACETAMINOPHEN 7.5-325 MG PO TABS
1.0000 | ORAL_TABLET | ORAL | Status: DC | PRN
Start: 2020-08-31 — End: 2020-09-01

## 2020-08-31 MED ORDER — AMISULPRIDE (ANTIEMETIC) 5 MG/2ML IV SOLN: 10.0000 mg | Freq: Once | INTRAVENOUS | Status: DC | PRN

## 2020-08-31 MED ORDER — HYDROMORPHONE HCL 1 MG/ML IJ SOLN
INTRAMUSCULAR | Status: AC
  Filled 2020-08-31: qty 0.5

## 2020-08-31 MED ORDER — ALBUTEROL SULFATE HFA 108 (90 BASE) MCG/ACT IN AERS
INHALATION_SPRAY | RESPIRATORY_TRACT | Status: AC
  Filled 2020-08-31: qty 6.7

## 2020-08-31 MED ORDER — PROPOFOL 10 MG/ML IV BOLUS
INTRAVENOUS | Status: AC
  Filled 2020-08-31: qty 20

## 2020-08-31 MED ORDER — PROPOFOL 500 MG/50ML IV EMUL
INTRAVENOUS | Status: DC | PRN
  Administered 2020-08-31: 25 ug/kg/min via INTRAVENOUS

## 2020-08-31 MED ORDER — LACTATED RINGERS IV SOLN: INTRAVENOUS | Status: DC

## 2020-08-31 MED ORDER — EPINEPHRINE PF 1 MG/ML IJ SOLN
INTRAMUSCULAR | Status: AC
  Filled 2020-08-31: qty 3

## 2020-08-31 MED ORDER — ALBUTEROL SULFATE HFA 108 (90 BASE) MCG/ACT IN AERS
2.0000 | INHALATION_SPRAY | Freq: Two times a day (BID) | RESPIRATORY_TRACT | Status: DC
  Administered 2020-08-31: 2 via RESPIRATORY_TRACT

## 2020-08-31 MED ORDER — DOCUSATE SODIUM 100 MG PO CAPS
100.0000 mg | ORAL_CAPSULE | Freq: Two times a day (BID) | ORAL | Status: DC
  Filled 2020-08-31: qty 1

## 2020-08-31 MED ORDER — HYDROCODONE-ACETAMINOPHEN 5-325 MG PO TABS
1.0000 | ORAL_TABLET | ORAL | Status: DC | PRN
  Administered 2020-08-31 – 2020-09-01 (×4): 2 via ORAL
  Filled 2020-08-31 (×4): qty 2

## 2020-08-31 MED ORDER — HYDROMORPHONE HCL 1 MG/ML IJ SOLN
0.2500 mg | INTRAMUSCULAR | Status: DC | PRN
  Administered 2020-08-31: 0.5 mg via INTRAVENOUS

## 2020-08-31 MED ORDER — CLINDAMYCIN PHOSPHATE 900 MG/50ML IV SOLN
900.0000 mg | INTRAVENOUS | Status: AC
  Administered 2020-08-31: 900 mg via INTRAVENOUS

## 2020-08-31 MED ORDER — MOMETASONE FURO-FORMOTEROL FUM 200-5 MCG/ACT IN AERO
2.0000 | INHALATION_SPRAY | Freq: Two times a day (BID) | RESPIRATORY_TRACT | Status: DC
  Administered 2020-08-31 – 2020-09-01 (×2): 2 via RESPIRATORY_TRACT

## 2020-08-31 MED ORDER — MORPHINE SULFATE (PF) 4 MG/ML IV SOLN
0.5000 mg | INTRAVENOUS | Status: DC | PRN
  Administered 2020-08-31 (×2): 1 mg via INTRAVENOUS
  Filled 2020-08-31: qty 1

## 2020-08-31 MED ORDER — HYDROCODONE-ACETAMINOPHEN 7.5-325 MG PO TABS: 1.0000 | ORAL_TABLET | ORAL | 0 refills | Status: DC | PRN

## 2020-08-31 MED ORDER — FENTANYL CITRATE (PF) 100 MCG/2ML IJ SOLN
INTRAMUSCULAR | Status: DC | PRN
  Administered 2020-08-31 (×4): 50 ug via INTRAVENOUS

## 2020-08-31 MED ORDER — ROPIVACAINE HCL 5 MG/ML IJ SOLN
INTRAMUSCULAR | Status: DC | PRN
  Administered 2020-08-31: 30 mL via PERINEURAL

## 2020-08-31 SURGICAL SUPPLY — 73 items
ALLOGRAFT GRFTLNK IMPLANT SYST (Anchor) IMPLANT
ANCHOR BUTTON TIGHTROPE RN 14 (Anchor) ×1 IMPLANT
APL SKNCLS STERI-STRIP NONHPOA (GAUZE/BANDAGES/DRESSINGS)
BENZOIN TINCTURE PRP APPL 2/3 (GAUZE/BANDAGES/DRESSINGS) IMPLANT
BLADE EXCALIBUR 4.0X13 (MISCELLANEOUS) ×1 IMPLANT
BLADE SHAVER TORPEDO 4X13 (MISCELLANEOUS) ×1 IMPLANT
BLADE SURG 15 STRL LF DISP TIS (BLADE) ×2 IMPLANT
BLADE SURG 15 STRL SS (BLADE) ×4
BNDG ELASTIC 6X5.8 VLCR STR LF (GAUZE/BANDAGES/DRESSINGS) ×3 IMPLANT
BURR OVAL 8 FLU 4.0X13 (MISCELLANEOUS) ×2 IMPLANT
COVER BACK TABLE 60X90IN (DRAPES) ×2 IMPLANT
COVER WAND RF STERILE (DRAPES) IMPLANT
DISSECTOR  3.8MM X 13CM (MISCELLANEOUS) ×2
DISSECTOR 3.8MM X 13CM (MISCELLANEOUS) IMPLANT
DRAPE ARTHROSCOPY W/POUCH 90 (DRAPES) ×2 IMPLANT
DRAPE IMP U-DRAPE 54X76 (DRAPES) IMPLANT
DRAPE OEC MINIVIEW 54X84 (DRAPES) ×2 IMPLANT
DRAPE U-SHAPE 76X120 STRL (DRAPES) ×2 IMPLANT
DRILL FLIPCUTTER III 6-12 (ORTHOPEDIC DISPOSABLE SUPPLIES) ×1 IMPLANT
DRSG EMULSION OIL 3X3 NADH (GAUZE/BANDAGES/DRESSINGS) ×1 IMPLANT
DURAPREP 26ML APPLICATOR (WOUND CARE) ×2 IMPLANT
ELECT REM PT RETURN 9FT ADLT (ELECTROSURGICAL) ×2
ELECTRODE REM PT RTRN 9FT ADLT (ELECTROSURGICAL) IMPLANT
EXCALIBUR 3.8MM X 13CM (MISCELLANEOUS) ×1 IMPLANT
FLIPCUTTER III 6-12 AR-1204FF (ORTHOPEDIC DISPOSABLE SUPPLIES) ×2
GAUZE SPONGE 4X4 12PLY STRL (GAUZE/BANDAGES/DRESSINGS) ×2 IMPLANT
GLOVE SRG 8 PF TXTR STRL LF DI (GLOVE) ×2 IMPLANT
GLOVE SURG ENC MOIS LTX SZ7.5 (GLOVE) ×1 IMPLANT
GLOVE SURG ORTHO LTX SZ8 (GLOVE) ×2 IMPLANT
GLOVE SURG UNDER POLY LF SZ8 (GLOVE) ×4
GOWN STRL REUS W/ TWL LRG LVL3 (GOWN DISPOSABLE) ×1 IMPLANT
GOWN STRL REUS W/ TWL XL LVL3 (GOWN DISPOSABLE) ×1 IMPLANT
GOWN STRL REUS W/TWL LRG LVL3 (GOWN DISPOSABLE) ×2
GOWN STRL REUS W/TWL XL LVL3 (GOWN DISPOSABLE) ×4 IMPLANT
GRAFT TISS 60-80 FRZN TENDON (Tissue) IMPLANT
IMMOBILIZER KNEE 22 UNIV (SOFTGOODS) ×1 IMPLANT
IMMOBILIZER KNEE 24 THIGH 36 (MISCELLANEOUS) IMPLANT
IMMOBILIZER KNEE 24 UNIV (MISCELLANEOUS)
KIT TRANSTIBIAL (DISPOSABLE) ×2 IMPLANT
MANIFOLD NEPTUNE II (INSTRUMENTS) ×2 IMPLANT
NDL SAFETY ECLIPSE 18X1.5 (NEEDLE) ×1 IMPLANT
NEEDLE HYPO 18GX1.5 SHARP (NEEDLE) ×2
PACK ARTHROSCOPY DSU (CUSTOM PROCEDURE TRAY) ×2 IMPLANT
PACK BASIN DAY SURGERY FS (CUSTOM PROCEDURE TRAY) ×2 IMPLANT
PASSER SUT SWANSON 36MM LOOP (INSTRUMENTS) IMPLANT
PENCIL SMOKE EVACUATOR (MISCELLANEOUS) IMPLANT
PK GRAFTLINK ALLO IMPLANT SYST (Anchor) ×2 IMPLANT
PORT APPOLLO RF 90DEGREE MULTI (SURGICAL WAND) ×2 IMPLANT
SPONGE LAP 4X18 RFD (DISPOSABLE) ×2 IMPLANT
STRIP CLOSURE SKIN 1/2X4 (GAUZE/BANDAGES/DRESSINGS) ×2 IMPLANT
SUCTION FRAZIER HANDLE 10FR (MISCELLANEOUS) ×2
SUCTION TUBE FRAZIER 10FR DISP (MISCELLANEOUS) ×1 IMPLANT
SUT ETHILON 3 0 PS 1 (SUTURE) ×2 IMPLANT
SUT FIBERWIRE #2 38 T-5 BLUE (SUTURE)
SUT MNCRL AB 4-0 PS2 18 (SUTURE) IMPLANT
SUT VIC AB 0 CT1 27 (SUTURE) ×2
SUT VIC AB 0 CT1 27XBRD ANBCTR (SUTURE) IMPLANT
SUT VIC AB 1 CT1 27 (SUTURE)
SUT VIC AB 1 CT1 27XBRD ANBCTR (SUTURE) ×1 IMPLANT
SUT VIC AB 2-0 SH 27 (SUTURE)
SUT VIC AB 2-0 SH 27XBRD (SUTURE) IMPLANT
SUT VIC AB 3-0 FS2 27 (SUTURE) IMPLANT
SUT VIC AB 3-0 SH 27 (SUTURE)
SUT VIC AB 3-0 SH 27X BRD (SUTURE) IMPLANT
SUTURE FIBERWR #2 38 T-5 BLUE (SUTURE) IMPLANT
SUTURE TAPE 1.3 FIBERLOP 20 ST (SUTURE) IMPLANT
SUTURETAPE 1.3 FIBERLOOP 20 ST (SUTURE)
SYR 5ML LL (SYRINGE) ×2 IMPLANT
TISSUE GRAFTLINK FGL (Tissue) ×2 IMPLANT
TOWEL GREEN STERILE FF (TOWEL DISPOSABLE) ×4 IMPLANT
TUBE CONNECTING 20X1/4 (TUBING) ×2 IMPLANT
TUBING ARTHROSCOPY IRRIG 16FT (MISCELLANEOUS) ×2 IMPLANT
WRAP KNEE MAXI GEL POST OP (GAUZE/BANDAGES/DRESSINGS) ×2 IMPLANT

## 2020-08-31 NOTE — Transfer of Care (Signed)
Immediate Anesthesia Transfer of Care Note  Patient: Vicki [name]  Procedure(s) Performed: RIGHT KNEE ARTHROSCOPY WITH ANTERIOR CRUCIATE LIGAMENT (ACL) REPAIR MEDIAL AND LATERAL  MENISECTOMY (Right Knee)  Patient Location: PACU  Anesthesia Type:GA combined with regional for post-op pain  Level of Consciousness: awake and oriented  Airway & Oxygen Therapy: Patient Spontanous Breathing and Patient connected to nasal cannula oxygen  Post-op Assessment: Report given to RN and Post -op Vital signs reviewed and stable  Post vital signs: Reviewed and stable  Last Vitals:  Vitals Value Taken Time  BP 148/102 08/31/20 1255  Temp    Pulse 109 08/31/20 1255  Resp 21 08/31/20 1255  SpO2 89 % 08/31/20 1255    Last Pain:  Vitals:   08/31/20 0928  TempSrc: Oral  PainSc: 3       Patients Stated Pain Goal: 5 (62/44/69 5072)  Complications: No complications documented.

## 2020-08-31 NOTE — Anesthesia Procedure Notes (Signed)
Procedure Name: LMA Insertion Performed by: Alfreida Steffenhagen, [place], CRNA Pre-anesthesia Checklist: Patient identified, Emergency Drugs available, Suction available and Patient being monitored Patient Re-evaluated:Patient Re-evaluated prior to induction Oxygen Delivery Method: Circle system utilized Preoxygenation: Pre-oxygenation with 100% oxygen Induction Type: IV induction Ventilation: Mask ventilation without difficulty LMA: LMA inserted LMA Size: 3.0 Number of attempts: 1 Airway Equipment and Method: Bite block Placement Confirmation: positive ETCO2 Tube secured with: Tape Dental Injury: Teeth and Oropharynx as per pre-operative assessment        

## 2020-08-31 NOTE — Discharge Instructions (Signed)
Diet: As you were doing prior to hospitalization   Activity: Increase activity slowly as tolerated  No lifting or driving for 6 weeks   Shower: May shower without a dressing on post op day #3, NO SOAKING in tub   Dressing: You may change your dressing on post op day #3.  Then change the dressing daily with sterile 4"x4"s gauze dressing  And TED hose for knees. Use paper tape to hold dressing in place  For hips. You may clean the incision with alcohol prior to redressing.   Weight Bearing: weight bearing as taught in physical therapy. Use a walker or  Crutches as instructed. In knee immobilizer only until follow up  To prevent constipation: you may use a stool softener such as -  Colace ( over the counter) 100 mg by mouth twice a day  Drink plenty of fluids ( prune juice may be helpful) and high fiber foods  Miralax ( over the counter) for constipation as needed.   Precautions: If you experience chest pain or shortness of breath - call 911 immediately For transfer to the hospital emergency department!!  If you develop a fever greater that 101 F, purulent drainage from wound, increased redness or drainage from wound, or calf pain -- Call the office   Follow- Up Appointment: Please call for an appointment to be seen in 2 weeks or as previously [place] - [phone]

## 2020-08-31 NOTE — Anesthesia Procedure Notes (Signed)
Anesthesia Regional Block: Femoral nerve block   Pre-Anesthetic Checklist: ,, timeout performed, Correct Patient, Correct Site, Correct Laterality, Correct Procedure, Correct Position, site marked, Risks and benefits discussed,  Surgical consent,  Pre-op evaluation,  At surgeon's request and post-op pain management  Laterality: Right  Prep: chloraprep       Needles:  Injection technique: Single-shot  Needle Type: Stimiplex     Needle Length: 9cm  Needle Gauge: 21     Additional Needles:   Procedures:,,,, ultrasound used (permanent image in chart),,,,  Narrative:  Start time: 08/31/2020 10:01 AM End time: 08/31/2020 10:06 AM Injection made incrementally with aspirations every 5 mL.  Performed by: Personally  Anesthesiologist: Lynda Rainwater, MD

## 2020-08-31 NOTE — Progress Notes (Signed)
Assisted Dr. Sabra Heck with right, ultrasound guided, femoral block. Side rails up, monitors on throughout procedure. See vital signs in flow sheet. Tolerated Procedure well.

## 2020-08-31 NOTE — Interval H&P Note (Signed)
History and Physical Interval Note:  08/31/2020 10:41 AM  Vicki [name]  has presented today for surgery, with the diagnosis of RIGHT ACL TEARN,MEDIAL MENICUS TEAR.  The various methods of treatment have been discussed with the patient and family. After consideration of risks, benefits and other options for treatment, the patient has consented to  Procedure(s) with comments: RIGHT KNEE ARTHROSCOPY WITH ANTERIOR CRUCIATE LIGAMENT (ACL) REPAIR MEDIAL AND LATERAL  MENISECTOMY (Right) - FEMORAL NERVE BLOCK as a surgical intervention.  The patient's history has been reviewed, patient examined, no change in status, stable for surgery.  I have reviewed the patient's chart and labs.  Questions were answered to the patient's satisfaction.     [name]

## 2020-08-31 NOTE — Anesthesia Preprocedure Evaluation (Signed)
Anesthesia Evaluation  Patient identified by MRN, date of birth, ID band Patient awake    Reviewed: Allergy & Precautions, H&P , NPO status , Patient's Chart, lab work & pertinent test results  Airway Mallampati: I  TM Distance: >3 FB Neck ROM: Full    Dental  (+) Teeth Intact, Dental Advisory Given   Pulmonary asthma , pneumonia, COPD,  COPD inhaler, Current Smoker and Patient abstained from smoking.,    breath sounds clear to auscultation       Cardiovascular hypertension, Pt. on medications  Rhythm:Regular Rate:Normal     Neuro/Psych PSYCHIATRIC DISORDERS Anxiety Depression  Neuromuscular disease    GI/Hepatic   Endo/Other    Renal/GU      Musculoskeletal  (+) Arthritis ,   Abdominal   Peds  Hematology   Anesthesia Other Findings   Reproductive/Obstetrics                             Anesthesia Physical  Anesthesia Plan  ASA: III  Anesthesia Plan: General   Post-op Pain Management:  Regional for Post-op pain   Induction: Intravenous  PONV Risk Score and Plan: 2 and Treatment may vary due to age or medical condition, Ondansetron and Midazolam  Airway Management Planned: LMA  Additional Equipment:   Intra-op Plan:   Post-operative Plan: Extubation in OR  Informed Consent: I have reviewed the patients History and Physical, chart, labs and discussed the procedure including the risks, benefits and alternatives for the proposed anesthesia with the patient or authorized representative who has indicated his/her understanding and acceptance.     Dental advisory given  Plan Discussed with: CRNA  Anesthesia Plan Comments:         Anesthesia Quick Evaluation

## 2020-09-01 ENCOUNTER — Encounter (HOSPITAL_BASED_OUTPATIENT_CLINIC_OR_DEPARTMENT_OTHER): Payer: Self-pay | Admitting: Orthopedic Surgery

## 2020-09-01 DIAGNOSIS — S83511A Sprain of anterior cruciate ligament of right knee, initial encounter: Secondary | ICD-10-CM | POA: Diagnosis not present

## 2020-09-01 NOTE — Op Note (Signed)
NAME: MARVELENE, STONEBERG MEDICAL RECORD NO: [id number] ACCOUNT NO: [id number] DATE OF BIRTH: Sep 20, 1965 FACILITY: [facility] LOCATION: [facility] PHYSICIAN: W D. Valeta Harms., MD  Operative Report   DATE OF PROCEDURE: 08/31/2020  PREOPERATIVE DIAGNOSES: 1.  Complete interstitial tear, anterior cruciate ligament, right knee. 2.  Complex tear of posterior horn of the medial and lateral meniscus. 3.  Chondromalacia lateral tibial plateau.  POSTOPERATIVE DIAGNOSES: 1.  Complete interstitial tear, anterior cruciate ligament, right knee. 2.  Complex tear of posterior horn of the medial and lateral meniscus. 3.  Chondromalacia lateral tibial plateau.  OPERATION: 1.  ACL reconstruction, right knee (GraftLink allograft). 2.  Partial medial and lateral meniscectomies. 3.  Chondroplasty of lateral tibial plateau.  SURGEON:  W D. Valeta Harms., MD  ASSISTANTMarjo Bicker.  ANESTHESIA:  General with a femoral block.  TOURNIQUET:  No tourniquet.  DESCRIPTION OF PROCEDURE:  Examination under anesthesia showed 4+ Lachman and pivot shift.  Arthroscope through medial inferolateral portals.  Medial meniscus showed a small, almost many bucket handle tear involving mainly the posterior horn.  Estimated  resection of about 25-30% of the meniscus.  No chondral damage was noted on the medial side of the knee.  Lateral side showed a more complex tear about a 30% meniscectomy required of the posterior horn of the lateral side, a generalized chondral  irregularity noted mainly on the tibia, grade 2, early grade 3 changes appreciated.  Chondroplasty carried out.  I identified the interstitial tear of the ACL graft lengths prepared on the back table.  We performed a notchplasty on the right knee.  We  then using the FlipCutter on the femoral side placed the Arthrex guide on the portal side and did retrograde FlipCutter 9.5 mm and about the 11 o'clock position for the right knee.  This was done to a depth of 20-25 mm.   We then used a conventional  transtibial technique on the tibia, drilled a 10 mm hole as there was a 9 mm graft.  A graft was then placed from tibial to femoral side, confirmed good placement of the EndoButton on both sides of the joint with a mini C-arm fluoroscopy.  No graft  impingement was noted as well as negligible Lachman.  Closure was affected with Vicryl and nylon on the skin.  Taken to recovery room in stable condition.   PUS D: 08/31/2020 12:38:36 pm T: 09/01/2020 5:03:00 am  JOB: [id number]/ [id number]

## 2020-09-01 NOTE — Anesthesia Postprocedure Evaluation (Signed)
Anesthesia Post Note  Patient: Jalaysha Skilton  Procedure(s) Performed: RIGHT KNEE ARTHROSCOPY WITH ANTERIOR CRUCIATE LIGAMENT (ACL) REPAIR MEDIAL AND LATERAL  MENISECTOMY (Right Knee) CHONDROPLASTY (Right Knee)     Patient location during evaluation: PACU Anesthesia Type: General Level of consciousness: awake and alert Pain management: pain level controlled Vital Signs Assessment: post-procedure vital signs reviewed and stable Respiratory status: spontaneous breathing, nonlabored ventilation and respiratory function stable Cardiovascular status: blood pressure returned to baseline and stable Postop Assessment: no apparent nausea or vomiting Anesthetic complications: no   No complications documented.  Last Vitals:  Vitals:   09/01/20 0400 09/01/20 0500  BP:    Pulse:    Resp:    Temp:    SpO2: 93% 93%    Last Pain:  Vitals:   09/01/20 0500  TempSrc:   PainSc: 3    Pain Goal: Patients Stated Pain Goal: 5 (08/31/20 0928)                 Lynda Rainwater

## 2020-09-13 ENCOUNTER — Other Ambulatory Visit: Payer: Self-pay

## 2020-09-14 ENCOUNTER — Other Ambulatory Visit: Payer: Self-pay | Admitting: Registered Nurse

## 2020-09-14 ENCOUNTER — Ambulatory Visit: Payer: 59 | Admitting: Registered Nurse

## 2020-09-14 ENCOUNTER — Encounter: Payer: Self-pay | Admitting: Registered Nurse

## 2020-09-14 ENCOUNTER — Other Ambulatory Visit: Payer: Self-pay

## 2020-09-14 VITALS — BP 130/89 | HR 129 | Temp 98.0°F | Resp 18 | Ht 63.0 in | Wt 167.0 lb

## 2020-09-14 DIAGNOSIS — L270 Generalized skin eruption due to drugs and medicaments taken internally: Secondary | ICD-10-CM

## 2020-09-14 DIAGNOSIS — J302 Other seasonal allergic rhinitis: Secondary | ICD-10-CM

## 2020-09-14 DIAGNOSIS — I10 Essential (primary) hypertension: Secondary | ICD-10-CM

## 2020-09-14 DIAGNOSIS — F32A Depression, unspecified: Secondary | ICD-10-CM

## 2020-09-14 DIAGNOSIS — G43909 Migraine, unspecified, not intractable, without status migrainosus: Secondary | ICD-10-CM | POA: Diagnosis not present

## 2020-09-14 MED ORDER — ELETRIPTAN HYDROBROMIDE 40 MG PO TABS: 40.0000 mg | ORAL_TABLET | ORAL | 0 refills | Status: DC

## 2020-09-14 MED ORDER — PREDNISONE 10 MG PO TABS: ORAL_TABLET | ORAL | 0 refills | Status: AC

## 2020-09-14 MED ORDER — ASPIRIN EC 81 MG PO TBEC: 81.0000 mg | DELAYED_RELEASE_TABLET | Freq: Two times a day (BID) | ORAL | 3 refills | Status: DC

## 2020-09-14 MED ORDER — CETIRIZINE HCL 10 MG PO TABS: 10.0000 mg | ORAL_TABLET | Freq: Every day | ORAL | 11 refills | Status: DC

## 2020-09-14 MED ORDER — HYDROXYZINE HCL 25 MG PO TABS: 12.5000 mg | ORAL_TABLET | Freq: Three times a day (TID) | ORAL | 0 refills | Status: DC | PRN

## 2020-09-14 MED ORDER — TRIAMCINOLONE ACETONIDE 0.1 % EX CREA: 1.0000 "application " | TOPICAL_CREAM | Freq: Two times a day (BID) | CUTANEOUS | 0 refills | Status: DC

## 2020-09-14 MED ORDER — CITALOPRAM HYDROBROMIDE 20 MG PO TABS: 60.0000 mg | ORAL_TABLET | Freq: Every day | ORAL | 2 refills | Status: DC

## 2020-09-14 NOTE — Patient Instructions (Addendum)
Sorry to see you so soon!  Glad the surgery went well  My top suspect is still the fabric softener - try going without this for a week or two after finishing the prednisone.   If symptoms worsen or fail to improve call me or send me a MyChart message before the end of the week  Let me know if there's anything else I can do for you  Thank you  Rich     If you have lab work done today you will be contacted with your lab results within the next 2 weeks.  If you have not heard from [place] then please contact us. The fastest way to get your results is to register for My Chart.   IF you received an x-ray today, you will receive an invoice from [facility]. Please contact [facility] at [phone] with questions or concerns regarding your invoice.   IF you received labwork today, you will receive an invoice from [facility]. Please contact [facility] at [phone] with questions or concerns regarding your invoice.   Our billing staff will not be able to assist you with questions regarding bills from these companies.  You will be contacted with the lab results as soon as they are available. The fastest way to get your results is to activate your My Chart account. Instructions are located on the last page of this paperwork. If you have not heard from [place] regarding the results in 2 weeks, please contact this office.

## 2020-09-14 NOTE — Telephone Encounter (Signed)
Pharmacy comment: Alternative Requested.

## 2020-09-14 NOTE — Progress Notes (Signed)
Acute Office Visit  Subjective:    Patient ID: Vicki [name], female    DOB: 12-09-1965, 55 y.o.   MRN: [id number]  Chief Complaint  Patient presents with  . Urticaria    Patient states she broke out in hives last Friday all over her body. Patient has been taking benadryl medication and creme some relief. The hives come and go but she has more popping up as she is sitting in room.    HPI Patient is in today for rash  Red, mild raised, itching rash  Onset over past few days. Intermittent. Flares are extremely uncomfortable Has tried PO and topical benadryl with some relief  Did change her fabric softener around 3-4 weeks ago. Otherwise notes that she had had sutures removed and liquid bandage applied to surgical site and steri strips applied  Otherwise no changes  No shob, doe, headache, nvd, wheezing, other changes.  Otherwise no concerns.   Past Medical History:  Diagnosis Date  . Allergy    seasonal allergies  . Anxiety    on meds  . Arthritis    hands  . Asthma    childhood-current  . Carpal tunnel syndrome    had bilateral sx to tx  . Cataract    bilateral--no sx yet  . COPD (chronic obstructive pulmonary disease) ([place])   . Depression    on meds  . Diverticulitis   . Emphysema of lung ([place])    uses MDI  . Essential hypertension, benign 07/14/2019   on meds  . Hyperlipidemia    on meds  . Oxygen deficiency    home O2 at 2.5L/[place] nightly  . Oxygen dependent    uses O2 at 2.5L/ nightly    Past Surgical History:  Procedure Laterality Date  . ABDOMINAL HYSTERECTOMY  2009  . APPENDECTOMY    . ARTHROSCOPIC REPAIR ACL Left 2011/2014   x 2  . BIOPSY  01/21/2020   Procedure: BIOPSY;  Surgeon: Mauri Pole, MD;  Location: [facility];  Service: Endoscopy;;  EGD and COLON  . BUNIONECTOMY  2006   both  . CARPAL TUNNEL RELEASE Bilateral 10/28/2013   Procedure: BILATERAL CARPAL TUNNEL RELEASE;  Surgeon: Wynonia Sours, MD;  Location: [facility];  Service: Orthopedics;  Laterality: Bilateral;  . CHOLECYSTECTOMY  1994  . CHONDROPLASTY Right 08/31/2020   Procedure: CHONDROPLASTY;  Surgeon: Earlie Server, MD;  Location: [facility];  Service: Orthopedics;  Laterality: Right;  . COLONOSCOPY WITH PROPOFOL N/A 01/21/2020   Procedure: COLONOSCOPY WITH PROPOFOL;  Surgeon: Mauri Pole, MD;  Location: [facility];  Service: Endoscopy;  Laterality: N/A;  . DILATION AND CURETTAGE OF UTERUS    . ESOPHAGOGASTRODUODENOSCOPY (EGD) WITH PROPOFOL N/A 01/21/2020   Procedure: ESOPHAGOGASTRODUODENOSCOPY (EGD) WITH PROPOFOL;  Surgeon: Mauri Pole, MD;  Location: [facility];  Service: Endoscopy;  Laterality: N/A;  . KNEE ARTHROSCOPY WITH ANTERIOR CRUCIATE LIGAMENT (ACL) REPAIR Right 08/31/2020   Procedure: RIGHT KNEE ARTHROSCOPY WITH ANTERIOR CRUCIATE LIGAMENT (ACL) REPAIR MEDIAL AND LATERAL  MENISECTOMY;  Surgeon: Earlie Server, MD;  Location: [facility];  Service: Orthopedics;  Laterality: Right;  FEMORAL NERVE BLOCK  . POLYPECTOMY  01/21/2020   Procedure: POLYPECTOMY;  Surgeon: Mauri Pole, MD;  Location: [facility];  Service: Endoscopy;;  . TUBAL LIGATION  2004    Family History  Problem Relation Age of Onset  . Brain cancer Mother 21  . Lung cancer Mother 21  . Breast cancer Sister  76  . Ovarian cancer Sister 7  . Colon cancer Neg Hx   . Colon polyps Neg Hx   . Esophageal cancer Neg Hx   . Rectal cancer Neg Hx   . Stomach cancer Neg Hx     Social History   Socioeconomic History  . Marital status: Married    Spouse name: [name]  . Number of children: 3  . Years of education: 12th grade  . Highest education level: Not on file  Occupational History  . Occupation: Airline pilot: [employer]  Tobacco Use  . Smoking status: Current Every Day Smoker    Packs/day: 1.00    Years: 34.00    Pack years: 34.00    Types: Cigarettes  . Smokeless tobacco: Never  Used  Vaping Use  . Vaping Use: Never used  Substance and Sexual Activity  . Alcohol use: Yes    Comment: everyday  . Drug use: No  . Sexual activity: Yes    Birth control/protection: Surgical  Other Topics Concern  . Not on file  Social History Narrative   Lives with her husband.   Their 3 children live independently.   Social Determinants of Health   Financial Resource Strain: Not on file  Food Insecurity: Not on file  Transportation Needs: Not on file  Physical Activity: Not on file  Stress: Not on file  Social Connections: Not on file  Intimate Partner Violence: Not on file    Outpatient Medications Prior to Visit  Medication Sig Dispense Refill  . acetaminophen (TYLENOL) 500 MG tablet Take 1,000 mg by mouth every 6 (six) hours as needed for moderate pain.    Marland Kitchen albuterol (VENTOLIN HFA) 108 (90 Base) MCG/ACT inhaler Inhale 2 puffs into the lungs 2 (two) times daily. 18 g 11  . ALPRAZolam (XANAX) 1 MG tablet Take 1 tablet (1 mg total) by mouth 3 (three) times daily as needed for anxiety. 120 tablet 0  . amLODipine (NORVASC) 5 MG tablet Take 1 tablet (5 mg total) by mouth daily. 200 tablet 0  . celecoxib (CELEBREX) 200 MG capsule Take 1 capsule (200 mg total) by mouth 2 (two) times daily. X 2 weeks, then as needed for pain 60 capsule 0  . Fluticasone-Salmeterol (ADVAIR DISKUS) 500-50 MCG/DOSE AEPB Inhale 1 puff into the lungs in the morning and at bedtime. 60 each 11  . gabapentin (NEURONTIN) 300 MG capsule Take 1 capsule (300 mg total) by mouth 2 (two) times daily. 180 capsule 3  . HYDROcodone-acetaminophen (NORCO) 7.5-325 MG tablet Take 1 tablet by mouth every 4 (four) hours as needed for moderate pain. 40 tablet 0  . INCRUSE ELLIPTA 62.5 MCG/INH AEPB TAKE 1 PUFF BY MOUTH EVERY DAY 30 each 6  . Menthol, Topical Analgesic, (BIOFREEZE EX) Apply 1 application topically daily as needed (back pain).    Marland Kitchen omeprazole (PRILOSEC) 20 MG capsule TAKE 1 CAPSULE BY MOUTH TWICE A DAY  BEFORE A MEAL (Patient taking differently: Take 20 mg by mouth 2 (two) times daily.) 180 capsule 1  . ondansetron (ZOFRAN) 8 MG tablet Take 8 mg by mouth every 8 (eight) hours as needed for nausea or vomiting.    . pantoprazole (PROTONIX) 40 MG tablet Take 1 tablet (40 mg total) by mouth daily. 90 tablet 3  . prazosin (MINIPRESS) 1 MG capsule TAKE 1 TO 2 CAPSULES (1 TO 2 MG TOTAL) BY MOUTH AT BEDTIME. 180 capsule 2  . promethazine (PHENERGAN) 25 MG tablet Take 1  tablet (25 mg total) by mouth daily. 60 tablet 3  . rOPINIRole (REQUIP) 2 MG tablet Take 0.5 tablets (1 mg total) by mouth at bedtime. 270 tablet 0  . Tetrahydrozoline HCl (VISINE OP) Place 1 drop into both eyes daily as needed (dry eyes).    . traZODone (DESYREL) 50 MG tablet TAKE 1 TABLET BY MOUTH EVERYDAY AT BEDTIME (Patient taking differently: Take 50 mg by mouth at bedtime.) 90 tablet 1  . aspirin EC 81 MG tablet Take 1 tablet (81 mg total) by mouth 2 (two) times daily. 30 tablet 0  . cetirizine (ZYRTEC) 10 MG tablet Take 1 tablet (10 mg total) by mouth daily. 30 tablet 11  . citalopram (CELEXA) 20 MG tablet Take 3 tablets (60 mg total) by mouth daily. 270 tablet 2  . eletriptan (RELPAX) 40 MG tablet Take 1 tablet (40 mg total) by mouth See admin instructions. Take 40mg  at onset of migraine or headache as needed. May repeat in 2 hours if symptoms persist. 9 tablet 0   No facility-administered medications prior to visit.    Allergies  Allergen Reactions  . Jasmine Oil Shortness Of Breath and Swelling  . Levaquin [Levofloxacin] Shortness Of Breath, Itching, Swelling and Rash  . Penicillins Anaphylaxis and Hives    Tolerated ceftriaxone  . Aleve [Naproxen Sodium] Itching  . Ceftin [Cefuroxime Axetil] Other (See Comments)    unsure  . Depo-Provera [Medroxyprogesterone Acetate] Other (See Comments)    Severe acne  . Doxycycline     Raises blood pressure   . Oxycodone Nausea And Vomiting  . Tramadol Other (See Comments)    Kept  her up all night    Review of Systems  Constitutional: Negative.   HENT: Negative.   Eyes: Negative.   Respiratory: Negative.   Cardiovascular: Negative.   Gastrointestinal: Negative.   Genitourinary: Negative.   Musculoskeletal: Negative.   Skin: Negative.   Neurological: Negative.   Psychiatric/Behavioral: Negative.   All other systems reviewed and are negative.      Objective:    Physical Exam Vitals and nursing note reviewed.  Constitutional:      General: She is not in acute distress.    Appearance: Normal appearance. She is not ill-appearing, toxic-appearing or diaphoretic.  Cardiovascular:     Rate and Rhythm: Normal rate and regular rhythm.     Pulses: Normal pulses.     Heart sounds: Normal heart sounds. No murmur heard. No friction rub. No gallop.   Pulmonary:     Effort: Pulmonary effort is normal. No respiratory distress.     Breath sounds: Normal breath sounds. No stridor. No wheezing, rhonchi or rales.  Chest:     Chest wall: No tenderness.  Skin:    General: Skin is warm and dry.     Capillary Refill: Capillary refill takes less than 2 seconds.     Findings: Erythema and rash present.  Neurological:     General: No focal deficit present.     Mental Status: She is alert and oriented to person, place, and time. Mental status is at baseline.  Psychiatric:        Mood and Affect: Mood normal.        Behavior: Behavior normal.        Thought Content: Thought content normal.        Judgment: Judgment normal.     BP 130/89   Pulse (!) 129   Temp 98 F (36.7 C) (Temporal)   Resp 18  Ht 5\' 3"  (1.6 m)   Wt 167 lb (75.8 kg)   SpO2 99%   BMI 29.58 kg/m  Wt Readings from Last 3 Encounters:  09/14/20 167 lb (75.8 kg)  08/31/20 163 lb 12.8 oz (74.3 kg)  08/18/20 155 lb (70.3 kg)    There are no preventive care reminders to display for this patient.  There are no preventive care reminders to display for this patient.   Lab Results  Component  Value Date   TSH 3.240 08/17/2019   Lab Results  Component Value Date   WBC 7.8 08/18/2020   HGB 16.7 (H) 08/18/2020   HCT 49.6 (H) 08/18/2020   MCV 95.4 08/18/2020   PLT 207.0 08/18/2020   Lab Results  Component Value Date   NA 137 08/18/2020   K 3.8 08/18/2020   CO2 31 08/18/2020   GLUCOSE 98 08/18/2020   BUN 10 08/18/2020   CREATININE 0.70 08/18/2020   BILITOT 0.7 08/18/2020   ALKPHOS 130 (H) 08/18/2020   AST 40 (H) 08/18/2020   ALT 16 08/18/2020   PROT 7.8 08/18/2020   ALBUMIN 4.4 08/18/2020   CALCIUM 10.1 08/18/2020   ANIONGAP 11 04/11/2019   GFR 99.15 08/18/2020   Lab Results  Component Value Date   CHOL 207 (H) 07/14/2019   Lab Results  Component Value Date   HDL 66 07/14/2019   Lab Results  Component Value Date   LDLCALC 96 07/14/2019   Lab Results  Component Value Date   TRIG 274 (H) 07/14/2019   Lab Results  Component Value Date   CHOLHDL 3.1 07/14/2019   Lab Results  Component Value Date   HGBA1C 5.4 08/18/2020       Assessment & Plan:   Problem List Items Addressed This Visit      Cardiovascular and Mediastinum   Essential hypertension, benign - Primary   Relevant Medications   aspirin EC 81 MG tablet     Other   Depression   Relevant Medications   citalopram (CELEXA) 20 MG tablet   hydrOXYzine (ATARAX/VISTARIL) 25 MG tablet    Other Visit Diagnoses    Migraine without status migrainosus, not intractable, unspecified migraine type       Relevant Medications   eletriptan (RELPAX) 40 MG tablet   citalopram (CELEXA) 20 MG tablet   aspirin EC 81 MG tablet   Seasonal allergies       Relevant Medications   cetirizine (ZYRTEC) 10 MG tablet   Allergic drug rash       Relevant Medications   predniSONE (DELTASONE) 10 MG tablet   hydrOXYzine (ATARAX/VISTARIL) 25 MG tablet   triamcinolone cream (KENALOG) 0.1 %       Meds ordered this encounter  Medications  . eletriptan (RELPAX) 40 MG tablet    Sig: Take 1 tablet (40 mg total)  by mouth See admin instructions. Take 40mg  at onset of migraine or headache as needed. May repeat in 2 hours if symptoms persist.    Dispense:  9 tablet    Refill:  0    DX Code Needed  .    Order Specific Question:   Supervising Provider    Answer:   Neva SeatGREENE, JEFFREY R [[id number]]  . citalopram (CELEXA) 20 MG tablet    Sig: Take 3 tablets (60 mg total) by mouth daily.    Dispense:  270 tablet    Refill:  2    DX Code Needed  .    Order Specific Question:  Supervising Provider    Answer:   Carlota Raspberry, JEFFREY R [[id number]]  . cetirizine (ZYRTEC) 10 MG tablet    Sig: Take 1 tablet (10 mg total) by mouth daily.    Dispense:  30 tablet    Refill:  11    Order Specific Question:   Supervising Provider    Answer:   Carlota Raspberry, JEFFREY R [[id number]]  . aspirin EC 81 MG tablet    Sig: Take 1 tablet (81 mg total) by mouth 2 (two) times daily.    Dispense:  90 tablet    Refill:  3    Order Specific Question:   Supervising Provider    Answer:   Carlota Raspberry, JEFFREY R [[id number]]  . predniSONE (DELTASONE) 10 MG tablet    Sig: Take 4 tablets (40 mg total) by mouth daily with breakfast for 3 days, THEN 3 tablets (30 mg total) daily with breakfast for 3 days, THEN 2 tablets (20 mg total) daily with breakfast for 3 days, THEN 1 tablet (10 mg total) daily with breakfast for 3 days.    Dispense:  30 tablet    Refill:  0    Order Specific Question:   Supervising Provider    Answer:   Carlota Raspberry, JEFFREY R [[id number]]  . hydrOXYzine (ATARAX/VISTARIL) 25 MG tablet    Sig: Take 0.5-1 tablets (12.5-25 mg total) by mouth 3 (three) times daily as needed.    Dispense:  30 tablet    Refill:  0    Order Specific Question:   Supervising Provider    Answer:   Carlota Raspberry, JEFFREY R [[id number]]  . triamcinolone cream (KENALOG) 0.1 %    Sig: Apply 1 application topically 2 (two) times daily.    Dispense:  30 g    Refill:  0    Order Specific Question:   Supervising Provider    Answer:   Carlota Raspberry, JEFFREY R [[id number]]   PLAN  Prednisone taper,    Hydroxyzine  Triamcinolone  Remove fabric softener and monitor for improvement  Doubt infestation as husband has had no symptoms  Surgical sites appearing to heal well  Return if worsening or failing to improve  Patient encouraged to call clinic with any questions, comments, or concerns.   Maximiano Coss, NP

## 2020-09-22 ENCOUNTER — Other Ambulatory Visit (HOSPITAL_COMMUNITY): Payer: Self-pay | Admitting: Orthopedic Surgery

## 2020-09-22 DIAGNOSIS — M79604 Pain in right leg: Secondary | ICD-10-CM

## 2020-09-23 ENCOUNTER — Other Ambulatory Visit: Payer: Self-pay

## 2020-09-23 ENCOUNTER — Ambulatory Visit (HOSPITAL_COMMUNITY)
Admission: RE | Admit: 2020-09-23 | Discharge: 2020-09-23 | Disposition: A | Discharge status: Home / Self Care | Payer: 59 | Source: Ambulatory Visit | Attending: Orthopedic Surgery | Admitting: Orthopedic Surgery

## 2020-09-23 DIAGNOSIS — M7989 Other specified soft tissue disorders: Secondary | ICD-10-CM | POA: Diagnosis present

## 2020-09-23 DIAGNOSIS — M79604 Pain in right leg: Secondary | ICD-10-CM | POA: Diagnosis present

## 2020-09-23 NOTE — Progress Notes (Signed)
Lower extremity venous has been completed.   Preliminary results in CV Proc.   Abram Sander 09/23/2020 9:15 AM

## 2020-10-04 ENCOUNTER — Telehealth: Payer: Self-pay | Admitting: Registered Nurse

## 2020-10-04 ENCOUNTER — Encounter: Payer: Self-pay | Admitting: Registered Nurse

## 2020-10-04 ENCOUNTER — Ambulatory Visit: Payer: 59 | Admitting: Registered Nurse

## 2020-10-04 ENCOUNTER — Other Ambulatory Visit: Payer: Self-pay

## 2020-10-04 VITALS — BP 102/71 | HR 74 | Temp 98.0°F | Resp 18 | Ht 63.0 in | Wt 167.8 lb

## 2020-10-04 DIAGNOSIS — L299 Pruritus, unspecified: Secondary | ICD-10-CM

## 2020-10-04 LAB — CBC WITH DIFFERENTIAL/PLATELET
Basophils Absolute: 0 10*3/uL (ref 0.0–0.1)
Basophils Relative: 0.4 % (ref 0.0–3.0)
Eosinophils Absolute: 0.5 10*3/uL (ref 0.0–0.7)
Eosinophils Relative: 5.9 % — ABNORMAL HIGH (ref 0.0–5.0)
HCT: 47 % — ABNORMAL HIGH (ref 36.0–46.0)
Hemoglobin: 15.7 g/dL — ABNORMAL HIGH (ref 12.0–15.0)
Lymphocytes Relative: 48 % — ABNORMAL HIGH (ref 12.0–46.0)
Lymphs Abs: 3.7 10*3/uL (ref 0.7–4.0)
MCHC: 33.5 g/dL (ref 30.0–36.0)
MCV: 94.5 fl (ref 78.0–100.0)
Monocytes Absolute: 0.6 10*3/uL (ref 0.1–1.0)
Monocytes Relative: 7.6 % (ref 3.0–12.0)
Neutro Abs: 3 10*3/uL (ref 1.4–7.7)
Neutrophils Relative %: 38.1 % — ABNORMAL LOW (ref 43.0–77.0)
Platelets: 330 10*3/uL (ref 150.0–400.0)
RBC: 4.97 Mil/uL (ref 3.87–5.11)
RDW: 13.7 % (ref 11.5–15.5)
WBC: 7.7 10*3/uL (ref 4.0–10.5)

## 2020-10-04 LAB — COMPREHENSIVE METABOLIC PANEL
ALT: 17 U/L (ref 0–35)
AST: 32 U/L (ref 0–37)
Albumin: 4.5 g/dL (ref 3.5–5.2)
Alkaline Phosphatase: 112 U/L (ref 39–117)
BUN: 11 mg/dL (ref 6–23)
CO2: 28 mEq/L (ref 19–32)
Calcium: 9.1 mg/dL (ref 8.4–10.5)
Chloride: 97 mEq/L (ref 96–112)
Creatinine, Ser: 0.74 mg/dL (ref 0.40–1.20)
GFR: 92.67 mL/min (ref >60.00)
Glucose, Bld: 83 mg/dL (ref 70–99)
Potassium: 4 mEq/L (ref 3.5–5.1)
Sodium: 137 mEq/L (ref 135–145)
Total Bilirubin: 0.5 mg/dL (ref 0.2–1.2)
Total Protein: 7.3 g/dL (ref 6.0–8.3)

## 2020-10-04 LAB — LIPASE: Lipase: 29 U/L (ref 11.0–59.0)

## 2020-10-04 LAB — AMYLASE: Amylase: 18 U/L — ABNORMAL LOW (ref 27–131)

## 2020-10-04 LAB — TSH: TSH: 2.72 u[IU]/mL (ref 0.35–4.50)

## 2020-10-04 LAB — SEDIMENTATION RATE: Sed Rate: 12 mm/hr (ref 0–30)

## 2020-10-04 MED ORDER — MONTELUKAST SODIUM 10 MG PO TABS: 10.0000 mg | ORAL_TABLET | Freq: Every day | ORAL | 3 refills | Status: DC

## 2020-10-04 MED ORDER — PREDNISONE 10 MG PO TABS: ORAL_TABLET | ORAL | 0 refills | Status: AC

## 2020-10-04 NOTE — Telephone Encounter (Signed)
Patient is now put on the schedule for today due to a cancellation that we had.

## 2020-10-04 NOTE — Patient Instructions (Signed)
° ° ° °  If you have lab work done today you will be contacted with your lab results within the next 2 weeks.  If you have not heard from us then please contact us. The fastest way to get your results is to register for My Chart. ° ° °IF you received an x-ray today, you will receive an invoice from [facility]. Please contact [facility] at [phone] with questions or concerns regarding your invoice.  ° °IF you received labwork today, you will receive an invoice from [facility]. Please contact [facility] at [phone] with questions or concerns regarding your invoice.  ° °Our billing staff will not be able to assist you with questions regarding bills from these companies. ° °You will be contacted with the lab results as soon as they are available. The fastest way to get your results is to activate your My Chart account. Instructions are located on the last page of this paperwork. If you have not heard from us regarding the results in 2 weeks, please contact this office. °  ° ° ° °

## 2020-10-04 NOTE — Telephone Encounter (Signed)
Pt called in stating that she has broke out in hives again, she states they are all over and now on her face. We don't have any open appts today. I have sent pt to health team.   Pt can be reached at the home #

## 2020-10-04 NOTE — Progress Notes (Signed)
Discussed with Kathrin Ruddy, NP.  Rash noted April 29, ACL surgery with allograft April 20.  Diffuse rash reported, raised, urticarial appearing, possible component of dermatographia.  Appears inflammatory.  No petechiae.  No oral or vaginal lesions.  No objective fever, slight flushed feeling/nausea but no vomiting.  Has been relapsing remitting, some improvement with prednisone.  Celebrex has been stopped, continued hydroxyzine and Zyrtec.  Knee looks okay, no sign of apparent infection per his exam.  No known new medications other than Celebrex.  Possible allergic versus inflammatory cause.  Agree with CBC, CMP, sed rate, continuing Zyrtec, hydroxyzine as needed, short course of prednisone as previous improvement, and plan for Singulair.   Also agree with allergy or Derm referral, as well as close follow-up in the next 48 hours. Any worsening of rash or new systemic symptoms, should be seen in ER.

## 2020-10-04 NOTE — Progress Notes (Signed)
Acute Office Visit  Subjective:    Patient ID: Vicki [name], female    DOB: 1965-05-17, 55 y.o.   MRN: [id number]  Chief Complaint  Patient presents with  . Follow-up    Patient is here for follow up on hives that is starting to get worse. Patient states that the prednisone helped some but now that's she is done they are more intense and hard to deal with    HPI Patient is in today for continuing rash  Prednisone helped a little but not a lot.  Now it is worse than before Has also been using triamcinolone, topical benadryl, and hydroxyzine with little impact  Still no clear triggers. She is 1 mo+ post op allograft R ACL repair. Hx of autograft acl repair that she had tolerated well without AEs.  No changes to detergents, pets, home environment etc. New medication was celebrex that she had last taken in the first day or so of May. Has not taken since.  Rash is disseminated across entire body. Very itchy. Red and raised. Raised marks seem to appear in streaks rather than round, otherwise very much appear as urticaria. No hx of these. Tries to avoid scratching with nails, often using the heel of her hand insteady. No soughing. At one time the rash did look like it was blistering, but otherwise has been typical of urticaria. No involvement of mucus membranes. Her knee is stiff but no more so than expected, no drainage, redness, heat, or swelling. No additional locking or crepitus in knee.   No systemic symptoms or other systems apparently involved.   Past Medical History:  Diagnosis Date  . Allergy    seasonal allergies  . Anxiety    on meds  . Arthritis    hands  . Asthma    childhood-current  . Carpal tunnel syndrome    had bilateral sx to tx  . Cataract    bilateral--no sx yet  . COPD (chronic obstructive pulmonary disease) ([facility])   . Depression    on meds  . Diverticulitis   . Emphysema of lung ([place])    uses MDI  . Essential hypertension, benign 07/14/2019   on  meds  . Hyperlipidemia    on meds  . Oxygen deficiency    home O2 at 2.5L/[place] nightly  . Oxygen dependent    uses O2 at 2.5L/[place] nightly    Past Surgical History:  Procedure Laterality Date  . ABDOMINAL HYSTERECTOMY  2009  . APPENDECTOMY    . ARTHROSCOPIC REPAIR ACL Left 2011/2014   x 2  . BIOPSY  01/21/2020   Procedure: BIOPSY;  Surgeon: Mauri Pole, MD;  Location: [facility];  Service: Endoscopy;;  EGD and COLON  . BUNIONECTOMY  2006   both  . CARPAL TUNNEL RELEASE Bilateral 10/28/2013   Procedure: BILATERAL CARPAL TUNNEL RELEASE;  Surgeon: Wynonia Sours, MD;  Location: [place];  Service: Orthopedics;  Laterality: Bilateral;  . CHOLECYSTECTOMY  1994  . CHONDROPLASTY Right 08/31/2020   Procedure: CHONDROPLASTY;  Surgeon: Earlie Server, MD;  Location: [place];  Service: Orthopedics;  Laterality: Right;  . COLONOSCOPY WITH PROPOFOL N/A 01/21/2020   Procedure: COLONOSCOPY WITH PROPOFOL;  Surgeon: Mauri Pole, MD;  Location: [facility];  Service: Endoscopy;  Laterality: N/A;  . DILATION AND CURETTAGE OF UTERUS    . ESOPHAGOGASTRODUODENOSCOPY (EGD) WITH PROPOFOL N/A 01/21/2020   Procedure: ESOPHAGOGASTRODUODENOSCOPY (EGD) WITH PROPOFOL;  Surgeon: Mauri Pole, MD;  Location:  [facility];  Service: Endoscopy;  Laterality: N/A;  . KNEE ARTHROSCOPY WITH ANTERIOR CRUCIATE LIGAMENT (ACL) REPAIR Right 08/31/2020   Procedure: RIGHT KNEE ARTHROSCOPY WITH ANTERIOR CRUCIATE LIGAMENT (ACL) REPAIR MEDIAL AND LATERAL  MENISECTOMY;  Surgeon: Earlie Server, MD;  Location: [place];  Service: Orthopedics;  Laterality: Right;  FEMORAL NERVE BLOCK  . POLYPECTOMY  01/21/2020   Procedure: POLYPECTOMY;  Surgeon: Mauri Pole, MD;  Location: [facility];  Service: Endoscopy;;  . TUBAL LIGATION  2004    Family History  Problem Relation Age of Onset  . Brain cancer Mother 5  . Lung cancer Mother 56  . Breast cancer Sister 10   . Ovarian cancer Sister 64  . Colon cancer Neg Hx   . Colon polyps Neg Hx   . Esophageal cancer Neg Hx   . Rectal cancer Neg Hx   . Stomach cancer Neg Hx     Social History   Socioeconomic History  . Marital status: Married    Spouse name: [name]  . Number of children: 3  . Years of education: 12th grade  . Highest education level: Not on file  Occupational History  . Occupation: Airline pilot: [employer]  Tobacco Use  . Smoking status: Current Every Day Smoker    Packs/day: 1.00    Years: 34.00    Pack years: 34.00    Types: Cigarettes  . Smokeless tobacco: Never Used  Vaping Use  . Vaping Use: Never used  Substance and Sexual Activity  . Alcohol use: Yes    Comment: everyday  . Drug use: No  . Sexual activity: Yes    Birth control/protection: Surgical  Other Topics Concern  . Not on file  Social History Narrative   Lives with her husband.   Their 3 children live independently.   Social Determinants of Health   Financial Resource Strain: Not on file  Food Insecurity: Not on file  Transportation Needs: Not on file  Physical Activity: Not on file  Stress: Not on file  Social Connections: Not on file  Intimate Partner Violence: Not on file    Outpatient Medications Prior to Visit  Medication Sig Dispense Refill  . acetaminophen (TYLENOL) 500 MG tablet Take 1,000 mg by mouth every 6 (six) hours as needed for moderate pain.    Marland Kitchen albuterol (VENTOLIN HFA) 108 (90 Base) MCG/ACT inhaler Inhale 2 puffs into the lungs 2 (two) times daily. 18 g 11  . ALPRAZolam (XANAX) 1 MG tablet Take 1 tablet (1 mg total) by mouth 3 (three) times daily as needed for anxiety. 120 tablet 0  . amLODipine (NORVASC) 5 MG tablet Take 1 tablet (5 mg total) by mouth daily. 200 tablet 0  . aspirin EC 81 MG tablet Take 1 tablet (81 mg total) by mouth 2 (two) times daily. 90 tablet 3  . cetirizine (ZYRTEC) 10 MG tablet Take 1 tablet (10 mg total) by mouth daily. 30 tablet 11  .  citalopram (CELEXA) 20 MG tablet Take 3 tablets (60 mg total) by mouth daily. 270 tablet 2  . eletriptan (RELPAX) 40 MG tablet TAKE 1 TABLET AT ONSET OF MIGRAINE OR HEADACHE AS NEEDED. MAY REPEAT IN 2 HOURS IF SYMPTOMS PERSIST. 9 tablet 0  . Fluticasone-Salmeterol (ADVAIR DISKUS) 500-50 MCG/DOSE AEPB Inhale 1 puff into the lungs in the morning and at bedtime. 60 each 11  . gabapentin (NEURONTIN) 300 MG capsule Take 1 capsule (300 mg total) by mouth 2 (two)  times daily. 180 capsule 3  . HYDROcodone-acetaminophen (NORCO) 7.5-325 MG tablet Take 1 tablet by mouth every 4 (four) hours as needed for moderate pain. 40 tablet 0  . hydrOXYzine (ATARAX/VISTARIL) 25 MG tablet Take 0.5-1 tablets (12.5-25 mg total) by mouth 3 (three) times daily as needed. 30 tablet 0  . INCRUSE ELLIPTA 62.5 MCG/INH AEPB TAKE 1 PUFF BY MOUTH EVERY DAY 30 each 6  . Menthol, Topical Analgesic, (BIOFREEZE EX) Apply 1 application topically daily as needed (back pain).    Marland Kitchen omeprazole (PRILOSEC) 20 MG capsule TAKE 1 CAPSULE BY MOUTH TWICE A DAY BEFORE A MEAL (Patient taking differently: Take 20 mg by mouth 2 (two) times daily.) 180 capsule 1  . ondansetron (ZOFRAN) 8 MG tablet Take 8 mg by mouth every 8 (eight) hours as needed for nausea or vomiting.    . pantoprazole (PROTONIX) 40 MG tablet Take 1 tablet (40 mg total) by mouth daily. 90 tablet 3  . prazosin (MINIPRESS) 1 MG capsule TAKE 1 TO 2 CAPSULES (1 TO 2 MG TOTAL) BY MOUTH AT BEDTIME. 180 capsule 2  . promethazine (PHENERGAN) 25 MG tablet Take 1 tablet (25 mg total) by mouth daily. 60 tablet 3  . rOPINIRole (REQUIP) 2 MG tablet Take 0.5 tablets (1 mg total) by mouth at bedtime. 270 tablet 0  . Tetrahydrozoline HCl (VISINE OP) Place 1 drop into both eyes daily as needed (dry eyes).    . traZODone (DESYREL) 50 MG tablet TAKE 1 TABLET BY MOUTH EVERYDAY AT BEDTIME (Patient taking differently: Take 50 mg by mouth at bedtime.) 90 tablet 1  . triamcinolone cream (KENALOG) 0.1 % Apply  1 application topically 2 (two) times daily. 30 g 0  . celecoxib (CELEBREX) 200 MG capsule Take 1 capsule (200 mg total) by mouth 2 (two) times daily. X 2 weeks, then as needed for pain 60 capsule 0   No facility-administered medications prior to visit.    Allergies  Allergen Reactions  . Jasmine Oil Shortness Of Breath and Swelling  . Levaquin [Levofloxacin] Shortness Of Breath, Itching, Swelling and Rash  . Penicillins Anaphylaxis and Hives    Tolerated ceftriaxone  . Aleve [Naproxen Sodium] Itching  . Ceftin [Cefuroxime Axetil] Other (See Comments)    unsure  . Depo-Provera [Medroxyprogesterone Acetate] Other (See Comments)    Severe acne  . Doxycycline     Raises blood pressure   . Oxycodone Nausea And Vomiting  . Tramadol Other (See Comments)    Kept her up all night    Review of Systems Per hpi      Objective:    Physical Exam Vitals and nursing note reviewed.  Constitutional:      General: She is not in acute distress.    Appearance: Normal appearance. She is not ill-appearing, toxic-appearing or diaphoretic.  Cardiovascular:     Rate and Rhythm: Normal rate and regular rhythm.     Pulses: Normal pulses.     Heart sounds: Normal heart sounds. No murmur heard. No friction rub. No gallop.   Pulmonary:     Effort: Pulmonary effort is normal. No respiratory distress.     Breath sounds: Normal breath sounds. No stridor. No wheezing, rhonchi or rales.  Chest:     Chest wall: No tenderness.  Skin:    General: Skin is warm and dry.     Capillary Refill: Capillary refill takes less than 2 seconds.     Findings: Erythema and rash present.  Neurological:  General: No focal deficit present.     Mental Status: She is alert and oriented to person, place, and time. Mental status is at baseline.  Psychiatric:        Mood and Affect: Mood normal.        Behavior: Behavior normal.        Thought Content: Thought content normal.        Judgment: Judgment normal.            BP 102/71   Pulse 74   Temp 98 F (36.7 C) (Temporal)   Resp 18   Ht 5\' 3"  (1.6 m)   Wt 167 lb 12.8 oz (76.1 kg)   SpO2 99%   BMI 29.72 kg/m  Wt Readings from Last 3 Encounters:  10/04/20 167 lb 12.8 oz (76.1 kg)  09/14/20 167 lb (75.8 kg)  08/31/20 163 lb 12.8 oz (74.3 kg)    There are no preventive care reminders to display for this patient.  There are no preventive care reminders to display for this patient.   Lab Results  Component Value Date   TSH 2.72 10/04/2020   Lab Results  Component Value Date   WBC 7.7 10/04/2020   HGB 15.7 (H) 10/04/2020   HCT 47.0 (H) 10/04/2020   MCV 94.5 10/04/2020   PLT 330.0 10/04/2020   Lab Results  Component Value Date   NA 137 10/04/2020   K 4.0 10/04/2020   CO2 28 10/04/2020   GLUCOSE 83 10/04/2020   BUN 11 10/04/2020   CREATININE 0.74 10/04/2020   BILITOT 0.5 10/04/2020   ALKPHOS 112 10/04/2020   AST 32 10/04/2020   ALT 17 10/04/2020   PROT 7.3 10/04/2020   ALBUMIN 4.5 10/04/2020   CALCIUM 9.1 10/04/2020   ANIONGAP 11 04/11/2019   GFR 92.67 10/04/2020   Lab Results  Component Value Date   CHOL 207 (H) 07/14/2019   Lab Results  Component Value Date   HDL 66 07/14/2019   Lab Results  Component Value Date   LDLCALC 96 07/14/2019   Lab Results  Component Value Date   TRIG 274 (H) 07/14/2019   Lab Results  Component Value Date   CHOLHDL 3.1 07/14/2019   Lab Results  Component Value Date   HGBA1C 5.4 08/18/2020       Assessment & Plan:   Problem List Items Addressed This Visit   None   Visit Diagnoses    Pruritus    -  Primary   Relevant Medications   predniSONE (DELTASONE) 10 MG tablet   montelukast (SINGULAIR) 10 MG tablet   Other Relevant Orders   CBC with Differential/Platelet (Completed)   Comprehensive metabolic panel (Completed)   Sedimentation rate (Completed)   TSH (Completed)   Amylase (Completed)   Lipase (Completed)   Ambulatory referral to Allergy    Ambulatory referral to Dermatology       Meds ordered this encounter  Medications  . predniSONE (DELTASONE) 10 MG tablet    Sig: Take 4 tablets (40 mg total) by mouth daily with breakfast for 2 days, THEN 2 tablets (20 mg total) daily with breakfast for 2 days, THEN 1 tablet (10 mg total) daily with breakfast for 2 days.    Dispense:  14 tablet    Refill:  0    Order Specific Question:   Supervising Provider    Answer:   Carlota Raspberry, JEFFREY R [[id number]]  . montelukast (SINGULAIR) 10 MG tablet    Sig: Take 1 tablet (10  mg total) by mouth at bedtime.    Dispense:  30 tablet    Refill:  3    Order Specific Question:   Supervising Provider    Answer:   Carlota Raspberry, JEFFREY R [[id number]]   PLAN  Can do another shorter prednisone taper.   Need her to be seen by allergy and derm asap given her lack of response to treatment and no apparent triggers. Referrals placed  ER precautions given  [facility]   Labs collected. Will follow up with the patient as warranted.  Patient encouraged to call clinic with any questions, comments, or concerns.   Maximiano Coss, NP

## 2020-10-07 ENCOUNTER — Encounter: Payer: Self-pay | Admitting: Allergy & Immunology

## 2020-10-07 ENCOUNTER — Ambulatory Visit (INDEPENDENT_AMBULATORY_CARE_PROVIDER_SITE_OTHER): Payer: 59 | Admitting: Allergy & Immunology

## 2020-10-07 ENCOUNTER — Other Ambulatory Visit: Payer: Self-pay

## 2020-10-07 VITALS — BP 122/78 | HR 104 | Temp 96.8°F | Resp 14 | Ht 63.0 in | Wt 170.8 lb

## 2020-10-07 DIAGNOSIS — J449 Chronic obstructive pulmonary disease, unspecified: Secondary | ICD-10-CM

## 2020-10-07 DIAGNOSIS — Z889 Allergy status to unspecified drugs, medicaments and biological substances status: Secondary | ICD-10-CM | POA: Diagnosis not present

## 2020-10-07 DIAGNOSIS — L508 Other urticaria: Secondary | ICD-10-CM | POA: Insufficient documentation

## 2020-10-07 MED ORDER — CETIRIZINE HCL 10 MG PO TABS: ORAL_TABLET | ORAL | 1 refills | Status: DC

## 2020-10-07 MED ORDER — FEXOFENADINE HCL 180 MG PO TABS: ORAL_TABLET | ORAL | 1 refills | Status: DC

## 2020-10-07 MED ORDER — FAMOTIDINE 20 MG PO TABS: ORAL_TABLET | ORAL | 1 refills | Status: DC

## 2020-10-07 NOTE — Patient Instructions (Addendum)
1. Chronic obstructive pulmonary disease - Lung testing was in the 44% range. - I am going to defer all of your breathing management to your Pulmonology Team. - I will send our note to Dallas Breeding NP with Pulmonology to keep her in the loop.   2. Chronic urticaria - Your history does not have any "red flags" such as fevers, joint pains, or permanent skin changes that would be concerning for a more serious cause of hives.  - I do not think that this is related to your surgery especially since there is no metal items in your knee from your recent surgery. - I am going to send this to your Orthopedic Surgeon to keep him in the loop.  - We will get some labs to rule out serious causes of hives: complete blood count, alpha gal panel, tryptase level, chronic urticaria panel, CMP, ESR, and CRP. - We are also going to get an environmental allergy panel as well as a basic food panel. - We can do skin testing in the future once we have everything under control.  - Chronic hives are often times a self limited process and will "burn themselves out" over 6-12 months, although this is not always the case.  - In the meantime, start suppressive dosing of antihistamines:   - Morning: Zyrtec (cetirizine) 15m (two tablets) + Pepcid (famotidine) 254m - Midday: Allegra (fexofenadine) 18052mone tablet)   - Evening: Zyrtec (cetirizine) 25m66mwo tablets) + Pepcid (famotidine) 25mg32mou can change this dosing at home, decreasing the dose as needed or increasing the dosing as needed.  - Once we get labs back, we can make further management decisions.  - If you are not tolerating the medications or are tired of taking them every day, we can start treatment with a monthly injectable medication called Xolair.   3. Return in about 2 weeks (around 10/21/2020).    Please inform us [facility] visits, hospitalizations, or changes in symptoms. Call us be[facility] going to the ED for breathing or allergy  symptoms since we might be able to fit you in for a sick visit. Feel free to contact us anKoreaime with any questions, problems, or concerns.  It was a pleasure to meet you and your family today! We will work on getting you better!   Websites that have reliable patient information: 1. American Academy of Asthma, Allergy, and Immunology: www.aaaai.org 2. Food Allergy Research and Education (FARE): foodallergy.org 3. Mothers of Asthmatics: http://www.asthmacommunitynetwork.org 4. American College of Allergy, Asthma, and Immunology: www.acaai.org   COVID-19 Vaccine Information can be found at: [email or web address] related to vaccine distribution or appointments, please email [email or web address] or call [phone] realize that you might be concerned about having an allergic reaction to the COVID19 vaccines. To help with that concern, WE ARE OFFERING THE COVID19 VACCINES IN OUR OFFICE! Ask the front desk for dates!     "Like" us onKoreaacebook and Instagram for our latest updates!      A healthy democracy works best when ALL vNew [place] Life Insuranceicipate! Make sure you are registered to vote! If you have moved or changed any of your contact information, you will need to get this updated before voting!  In some cases, you MAY be able to register to vote online: [email or web address]

## 2020-10-07 NOTE — Progress Notes (Signed)
NEW PATIENT  Date of Service/Encounter:  10/07/20  Consult requested by: Maximiano Coss, NP   Assessment:   Chronic obstructive pulmonary disease  Chronic urticaria - unknown trigger  Torn right ACL - s/p repair in April 2022  Multiple medication allergies   Ms. Paschal presents for evaluation chronic urticaria.  Urticaria seem to have started after her right ACL repair, but this could just be a red herring.  She does not have any foreign bodies aside from a cadaveric donor ligaments in her right MCL repair.  She does not have any metal at all.  Although she certainly could have reacted to a topical antiseptic that was used during the procedure, this would have been out of her system within a week or so at the very most.  Therefore, we are going to do a work-up for serious causes of difficult to control urticaria with angioedema.  We are going to treat her with suppressive doses of antihistamines and make treatment decisions based on her lab results.  I did not address her breathing today since she is followed closely by pulmonology.  Plan/Recommendations:   1. Chronic obstructive pulmonary disease - Lung testing was in the 44% range. - I am going to defer all of your breathing management to your Pulmonology Team. - I will send our note to [place] Breeding NP with Pulmonology to keep her in the loop.   2. Chronic urticaria - Your history does not have any "red flags" such as fevers, joint pains, or permanent skin changes that would be concerning for a more serious cause of hives.  - I do not think that this is related to your surgery especially since there is no metal items in your knee from your recent surgery. - I am going to send this to your Orthopedic Surgeon to keep him in the loop.  - We will get some labs to rule out serious causes of hives: complete blood count, alpha gal panel, tryptase level, chronic urticaria panel, CMP, ESR, and CRP. - We are also going to get an  environmental allergy panel as well as a basic food panel. - We can do skin testing in the future once we have everything under control.  - Chronic hives are often times a self limited process and will "burn themselves out" over 6-12 months, although this is not always the case.  - In the meantime, start suppressive dosing of antihistamines:   - Morning: Zyrtec (cetirizine) 53m (two tablets) + Pepcid (famotidine) 53m - Midday: Allegra (fexofenadine) 18059mone tablet)   - Evening: Zyrtec (cetirizine) 53m21mwo tablets) + Pepcid (famotidine) 53mg73mou can change this dosing at home, decreasing the dose as needed or increasing the dosing as needed.  - Once we get labs back, we can make further management decisions.  - If you are not tolerating the medications or are tired of taking them every day, we can start treatment with a monthly injectable medication called Xolair.    3. Return in about 2 weeks (around 10/21/2020).    This note in its entirety was forwarded to the Provider who requested this consultation.  Subjective:   Vicki [name] 55 y.31 female presenting today for evaluation of  Chief Complaint  Patient presents with  . Urticaria    MarjoBrinn Westbya history of the following: Patient Active Problem List   Diagnosis Date Noted  . Anterior cruciate ligament complete tear 08/31/2020  . Preop pulmonary/respiratory exam  07/28/2020  . Polyp of transverse colon   . Colon cancer screening   . Generalized abdominal pain   . Nausea and vomiting   . Mucosal abnormality of rectum   . Elevated triglycerides with high cholesterol 07/14/2019  . RLS (restless legs syndrome) 07/14/2019  . Tobacco use disorder 07/14/2019  . Essential hypertension, benign 07/14/2019  . Generalized anxiety disorder 07/14/2019  . CAP (community acquired pneumonia) 04/07/2019  . UTI (urinary tract infection) 04/07/2019  . SIRS (systemic inflammatory response syndrome) ([place])  04/07/2019  . Suspected COVID-19 virus infection 04/07/2019  . Viral gastroenteritis 04/07/2019  . Fatigue 11/07/2018  . Depression 11/07/2018  . Insomnia 11/07/2018  . Polypharmacy 07/19/2017  . Asthma   . Carpal tunnel syndrome   . COPD (chronic obstructive pulmonary disease) ([place])   . DDD (degenerative disc disease), lumbar 10/22/2016    History obtained from: chart review and patient and daughter.  Vicki [name] was referred by Maximiano Coss, NP.     Vicki [name] is a 55 y.o. female presenting for an evaluation of urticaria. This started after her surgery. She has Surveyor, mining. She has a donor ACL in her leg. She does not think that there is metal. She had it on the left side but it was done "totally differently". This was 25 years ago and she originally damaged it by falling down the steps. Her most injury was in March 2022 by her dog.   She never had hives before this. She did have not start having issues until Friday April 29th, around 53 days after her surgery. From April 20th through April 29th, she did not have any hives at all. She has pictures of linear appearing urticarial lesions. They are mostly on her arms and legs, but they appear on her head everywhere. It pops up everywhere. She has some swelling of her face as well. Three is no permanent skin changes. She has them on her hands as well as her ankle.  She has been taking prednisone for the last 3 weeks as a prolonged taper. She is going down from 53m to 264mtomorrow. She has been using Benadryl routinely which is not touching it at all. She is taking Benadryl cream and Cerve. She has not used any antihistamines for the last 3 days. There is no association with any foods. She was on hydrocodone after the surgery. She has not taken this for the last 3 days. She was taking this prior to the surgery and had no hives. She is still breaking out over the last three days. She has not been on a longer acting   She has had hives  in the past but this was associated with PCN. This was around 30 years ago.    Asthma/Respiratory Symptom History: She has been on oxygen for 8 months. She has a known diagnosis of COPD. This is all followed by her PCP, an NP. She is on Advair, Incruse, and albuterol. She has been using her albuterol on a daily basis and through the night. She is on 3 L O2 at night. She was last seen by TaDallas BreedingP in March 2022. She is also [facility]. She is changing to [employer] she finishes her Incruse and Advair.   Allergic Rhinitis Symptom History: She does not have much in the way of allergic rhinitis symptoms.  She might have some worsening symptoms in the spring season.  She does not use anything on a routine basis.  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  She has not applied for disability, but couple of people have brought that up with her.  Otherwise, there is no history of other atopic diseases, including food allergies, drug allergies, stinging insect allergies, eczema or contact dermatitis. There is no significant infectious history. Vaccinations are up to date.    Past Medical History: Patient Active Problem List   Diagnosis Date Noted  . Anterior cruciate ligament complete tear 08/31/2020  . Preop pulmonary/respiratory exam 07/28/2020  . Polyp of transverse colon   . Colon cancer screening   . Generalized abdominal pain   . Nausea and vomiting   . Mucosal abnormality of rectum   . Elevated triglycerides with high cholesterol 07/14/2019  . RLS (restless legs syndrome) 07/14/2019  . Tobacco use disorder 07/14/2019  . Essential hypertension, benign 07/14/2019  . Generalized anxiety disorder 07/14/2019  . CAP (community acquired pneumonia) 04/07/2019  . UTI (urinary tract infection) 04/07/2019  . SIRS (systemic inflammatory response syndrome) ([place]) 04/07/2019  . Suspected COVID-19 virus infection 04/07/2019  .  Viral gastroenteritis 04/07/2019  . Fatigue 11/07/2018  . Depression 11/07/2018  . Insomnia 11/07/2018  . Polypharmacy 07/19/2017  . Asthma   . Carpal tunnel syndrome   . COPD (chronic obstructive pulmonary disease) ([place])   . DDD (degenerative disc disease), lumbar 10/22/2016    Medication List:  Allergies as of 10/07/2020      Reactions   Jasmine Oil Shortness Of Breath, Swelling   Levaquin [levofloxacin] Shortness Of Breath, Itching, Swelling, Rash   Penicillins Anaphylaxis, Hives   Tolerated ceftriaxone   Aleve [naproxen Sodium] Itching   Ceftin [cefuroxime Axetil] Other (See Comments)   unsure   Depo-provera [medroxyprogesterone Acetate] Other (See Comments)   Severe acne   Doxycycline    Raises blood pressure    Oxycodone Nausea And Vomiting   Tramadol Other (See Comments)   Kept her up all night      Medication List       Accurate as of Oct 07, 2020 11:45 AM. If you have any questions, ask your nurse or doctor.        acetaminophen 500 MG tablet Commonly known as: TYLENOL Take 1,000 mg by mouth every 6 (six) hours as needed for moderate pain.   albuterol 108 (90 Base) MCG/ACT inhaler Commonly known as: VENTOLIN HFA Inhale 2 puffs into the lungs 2 (two) times daily.   ALPRAZolam 1 MG tablet Commonly known as: XANAX Take 1 tablet (1 mg total) by mouth 3 (three) times daily as needed for anxiety.   amLODipine 5 MG tablet Commonly known as: NORVASC Take 1 tablet (5 mg total) by mouth daily.   aspirin EC 81 MG tablet Take 1 tablet (81 mg total) by mouth 2 (two) times daily.   BIOFREEZE EX Apply 1 application topically daily as needed (back pain).   cetirizine 10 MG tablet Commonly known as: ZYRTEC Take 1 tablet (10 mg total) by mouth daily. What changed: Another medication with the same name was added. Make sure you understand how and when to take each. Changed by: Valentina Shaggy, MD   cetirizine 10 MG tablet Commonly known as: ZYRTEC Take 2  tablets (38m) in the morning and 2 tablets (235m at night. What changed: You were already taking a medication with the same name, and this prescription was added. Make sure you understand how and when to take each. Changed by: JoValentina ShaggyMD  citalopram 20 MG tablet Commonly known as: CELEXA Take 3 tablets (60 mg total) by mouth daily.   eletriptan 40 MG tablet Commonly known as: RELPAX TAKE 1 TABLET AT ONSET OF MIGRAINE OR HEADACHE AS NEEDED. MAY REPEAT IN 2 HOURS IF SYMPTOMS PERSIST.   famotidine 20 MG tablet Commonly known as: Pepcid Take 20 mg in the morning and at night. Started by: Valentina Shaggy, MD   fexofenadine 180 MG tablet Commonly known as: ALLEGRA Take 1 tablet midday daily. Started by: Valentina Shaggy, MD   Fluticasone-Salmeterol 500-50 MCG/DOSE Aepb Commonly known as: Advair Diskus Inhale 1 puff into the lungs in the morning and at bedtime.   gabapentin 300 MG capsule Commonly known as: NEURONTIN Take 1 capsule (300 mg total) by mouth 2 (two) times daily.   HYDROcodone-acetaminophen 7.5-325 MG tablet Commonly known as: NORCO Take 1 tablet by mouth every 4 (four) hours as needed for moderate pain.   hydrOXYzine 25 MG tablet Commonly known as: ATARAX/VISTARIL Take 0.5-1 tablets (12.5-25 mg total) by mouth 3 (three) times daily as needed.   Incruse Ellipta 62.5 MCG/INH Aepb Generic drug: umeclidinium bromide TAKE 1 PUFF BY MOUTH EVERY DAY   montelukast 10 MG tablet Commonly known as: SINGULAIR Take 1 tablet (10 mg total) by mouth at bedtime.   omeprazole 20 MG capsule Commonly known as: PRILOSEC TAKE 1 CAPSULE BY MOUTH TWICE A DAY BEFORE A MEAL What changed:   how much to take  how to take this  when to take this  additional instructions   ondansetron 8 MG tablet Commonly known as: ZOFRAN Take 8 mg by mouth every 8 (eight) hours as needed for nausea or vomiting.   pantoprazole 40 MG tablet Commonly known as:  PROTONIX Take 1 tablet (40 mg total) by mouth daily.   prazosin 1 MG capsule Commonly known as: MINIPRESS TAKE 1 TO 2 CAPSULES (1 TO 2 MG TOTAL) BY MOUTH AT BEDTIME.   predniSONE 10 MG tablet Commonly known as: DELTASONE Take 4 tablets (40 mg total) by mouth daily with breakfast for 2 days, THEN 2 tablets (20 mg total) daily with breakfast for 2 days, THEN 1 tablet (10 mg total) daily with breakfast for 2 days. Start taking on: Oct 04, 2020   promethazine 25 MG tablet Commonly known as: PHENERGAN Take 1 tablet (25 mg total) by mouth daily.   rOPINIRole 2 MG tablet Commonly known as: REQUIP Take 0.5 tablets (1 mg total) by mouth at bedtime.   Stiolto Respimat 2.5-2.5 MCG/ACT Aers Generic drug: Tiotropium Bromide-Olodaterol   traZODone 50 MG tablet Commonly known as: DESYREL TAKE 1 TABLET BY MOUTH EVERYDAY AT BEDTIME What changed:   how much to take  how to take this  when to take this  additional instructions   triamcinolone cream 0.1 % Commonly known as: KENALOG Apply 1 application topically 2 (two) times daily.   VISINE OP Place 1 drop into both eyes daily as needed (dry eyes).       Birth History: non-contributory  Developmental History: non-contributory  Past Surgical History: Past Surgical History:  Procedure Laterality Date  . ABDOMINAL HYSTERECTOMY  2009  . APPENDECTOMY    . ARTHROSCOPIC REPAIR ACL Left 2011/2014   x 2  . BIOPSY  01/21/2020   Procedure: BIOPSY;  Surgeon: Mauri Pole, MD;  Location: [facility];  Service: Endoscopy;;  EGD and COLON  . BUNIONECTOMY  2006   both  . CARPAL TUNNEL RELEASE Bilateral 10/28/2013   Procedure:  BILATERAL CARPAL TUNNEL RELEASE;  Surgeon: Wynonia Sours, MD;  Location: [facility];  Service: Orthopedics;  Laterality: Bilateral;  . CHOLECYSTECTOMY  1994  . CHONDROPLASTY Right 08/31/2020   Procedure: CHONDROPLASTY;  Surgeon: Earlie Server, MD;  Location: [facility];  Service:  Orthopedics;  Laterality: Right;  . COLONOSCOPY WITH PROPOFOL N/A 01/21/2020   Procedure: COLONOSCOPY WITH PROPOFOL;  Surgeon: Mauri Pole, MD;  Location: [facility];  Service: Endoscopy;  Laterality: N/A;  . DILATION AND CURETTAGE OF UTERUS    . ESOPHAGOGASTRODUODENOSCOPY (EGD) WITH PROPOFOL N/A 01/21/2020   Procedure: ESOPHAGOGASTRODUODENOSCOPY (EGD) WITH PROPOFOL;  Surgeon: Mauri Pole, MD;  Location: [facility];  Service: Endoscopy;  Laterality: N/A;  . KNEE ARTHROSCOPY WITH ANTERIOR CRUCIATE LIGAMENT (ACL) REPAIR Right 08/31/2020   Procedure: RIGHT KNEE ARTHROSCOPY WITH ANTERIOR CRUCIATE LIGAMENT (ACL) REPAIR MEDIAL AND LATERAL  MENISECTOMY;  Surgeon: Earlie Server, MD;  Location: [facility];  Service: Orthopedics;  Laterality: Right;  FEMORAL NERVE BLOCK  . POLYPECTOMY  01/21/2020   Procedure: POLYPECTOMY;  Surgeon: Mauri Pole, MD;  Location: [facility];  Service: Endoscopy;;  . TUBAL LIGATION  2004     Family History: Family History  Problem Relation Age of Onset  . Brain cancer Mother 21  . Lung cancer Mother 43  . Breast cancer Sister 11  . Ovarian cancer Sister 6  . Colon cancer Neg Hx   . Colon polyps Neg Hx   . Esophageal cancer Neg Hx   . Rectal cancer Neg Hx   . Stomach cancer Neg Hx      Social History: Jamillah lives at home with her family.  She does not have a house with carpeting in the main living areas and hardwoods in the bedrooms.  She has electric heating and central cooling.  There is a dog inside of the home.  There are dust mite covers on the bed, but not the pillows.  There is tobacco exposure in the house as well as the car.  She has smoked 1 pack/day for 30 years.  She does not use a HEPA filter.  She is not exposed to fumes, chemicals, or dust.   Review of Systems  Constitutional: Negative.  Negative for chills, fever, malaise/fatigue and weight loss.  HENT: Positive for congestion. Negative for ear discharge,  ear pain and sinus pain.   Eyes: Negative for pain, discharge and redness.  Respiratory: Positive for cough, sputum production, shortness of breath and wheezing.   Cardiovascular: Negative.  Negative for chest pain and palpitations.  Gastrointestinal: Negative for abdominal pain, constipation, diarrhea, heartburn, nausea and vomiting.  Skin: Positive for itching and rash.  Neurological: Negative for dizziness and headaches.  Endo/Heme/Allergies: Positive for environmental allergies. Does not bruise/bleed easily.       Objective:   Blood pressure 122/78, pulse (!) 104, temperature (!) 96.8 F (36 C), temperature source Temporal, resp. rate 14, height '5\' 3"'  (1.6 m), weight 170 lb 12.8 oz (77.5 kg), SpO2 96 %. Body mass index is 30.26 kg/m.   Physical Exam:   Physical Exam Constitutional:      Appearance: She is well-developed. She is obese.  HENT:     Head: Normocephalic and atraumatic.     Right Ear: Tympanic membrane, ear canal and external ear normal. No drainage, swelling or tenderness. Tympanic membrane is not injected, scarred, erythematous, retracted or bulging.     Left Ear: Tympanic membrane, ear canal and external ear normal. No drainage,  swelling or tenderness. Tympanic membrane is not injected, scarred, erythematous, retracted or bulging.     Nose: No nasal deformity, septal deviation, mucosal edema or rhinorrhea.     Right Turbinates: Enlarged and swollen.     Left Turbinates: Enlarged and swollen.     Right Sinus: No maxillary sinus tenderness or frontal sinus tenderness.     Left Sinus: No maxillary sinus tenderness or frontal sinus tenderness.     Comments: Scant clear mucus.  Turbinates are erythematous.    Mouth/Throat:     Mouth: Mucous membranes are not pale and not dry.     Pharynx: Uvula midline.  Eyes:     General:        Right eye: No discharge.        Left eye: No discharge.     Conjunctiva/sclera: Conjunctivae normal.     Right eye: Right  conjunctiva is not injected. No chemosis.    Left eye: Left conjunctiva is not injected. No chemosis.    Pupils: Pupils are equal, round, and reactive to light.  Cardiovascular:     Rate and Rhythm: Normal rate and regular rhythm.     Heart sounds: Normal heart sounds.  Pulmonary:     Effort: Pulmonary effort is normal. Tachypnea present. No accessory muscle usage or respiratory distress.     Breath sounds: Examination of the right-upper field reveals wheezing. Examination of the left-upper field reveals wheezing. Examination of the right-middle field reveals wheezing. Examination of the left-middle field reveals wheezing. Wheezing present. No rhonchi or rales.     Comments: Decreased air movement at the bases.  Chest:     Chest wall: No tenderness.  Abdominal:     Tenderness: There is no abdominal tenderness. There is no guarding or rebound.  Lymphadenopathy:     Head:     Right side of head: No submandibular, tonsillar or occipital adenopathy.     Left side of head: No submandibular, tonsillar or occipital adenopathy.     Cervical: No cervical adenopathy.  Skin:    General: Skin is warm.     Capillary Refill: Capillary refill takes less than 2 seconds.     Coloration: Skin is not pale.     Findings: Rash present. No abrasion, erythema or petechiae. Rash is urticarial. Rash is not papular or vesicular.     Comments: Multiple excoriations on her arms and legs, especially on her thighs.   Neurological:     Mental Status: She is alert.  Psychiatric:        Behavior: Behavior is cooperative.      Diagnostic studies:    Spirometry: results abnormal (FEV1: 1.08/42%, FVC: 2.15/66%, FEV1/FVC: 50%).    Spirometry consistent with mixed obstructive and restrictive disease.   Allergy Studies: labs sent instead           Salvatore Marvel, MD [facility]

## 2020-10-14 LAB — ALPHA-GAL PANEL
Allergen Lamb IgE: <0.1 kU/L
Beef IgE: <0.1 kU/L
IgE (Immunoglobulin E), Serum: 217 IU/mL (ref 6–495)
O215-IgE Alpha-Gal: <0.1 kU/L
Pork IgE: <0.1 kU/L

## 2020-10-18 ENCOUNTER — Emergency Department (HOSPITAL_COMMUNITY): Payer: 59

## 2020-10-18 ENCOUNTER — Encounter (HOSPITAL_COMMUNITY): Payer: Self-pay

## 2020-10-18 ENCOUNTER — Inpatient Hospital Stay (HOSPITAL_COMMUNITY)
Admission: EM | Admit: 2020-10-18 | Discharge: 2020-10-21 | DRG: 871 | Disposition: A | Discharge status: Home / Self Care | Payer: 59 | Attending: Internal Medicine | Admitting: Internal Medicine

## 2020-10-18 DIAGNOSIS — J690 Pneumonitis due to inhalation of food and vomit: Secondary | ICD-10-CM | POA: Diagnosis present

## 2020-10-18 DIAGNOSIS — Z8041 Family history of malignant neoplasm of ovary: Secondary | ICD-10-CM

## 2020-10-18 DIAGNOSIS — K5732 Diverticulitis of large intestine without perforation or abscess without bleeding: Secondary | ICD-10-CM | POA: Diagnosis present

## 2020-10-18 DIAGNOSIS — Z9981 Dependence on supplemental oxygen: Secondary | ICD-10-CM

## 2020-10-18 DIAGNOSIS — L508 Other urticaria: Secondary | ICD-10-CM | POA: Diagnosis present

## 2020-10-18 DIAGNOSIS — Z9049 Acquired absence of other specified parts of digestive tract: Secondary | ICD-10-CM

## 2020-10-18 DIAGNOSIS — Z803 Family history of malignant neoplasm of breast: Secondary | ICD-10-CM

## 2020-10-18 DIAGNOSIS — R55 Syncope and collapse: Secondary | ICD-10-CM | POA: Diagnosis present

## 2020-10-18 DIAGNOSIS — Z888 Allergy status to other drugs, medicaments and biological substances status: Secondary | ICD-10-CM

## 2020-10-18 DIAGNOSIS — L509 Urticaria, unspecified: Secondary | ICD-10-CM | POA: Diagnosis present

## 2020-10-18 DIAGNOSIS — F411 Generalized anxiety disorder: Secondary | ICD-10-CM | POA: Diagnosis present

## 2020-10-18 DIAGNOSIS — J439 Emphysema, unspecified: Secondary | ICD-10-CM | POA: Diagnosis present

## 2020-10-18 DIAGNOSIS — J189 Pneumonia, unspecified organism: Secondary | ICD-10-CM | POA: Diagnosis not present

## 2020-10-18 DIAGNOSIS — K5792 Diverticulitis of intestine, part unspecified, without perforation or abscess without bleeding: Secondary | ICD-10-CM | POA: Diagnosis present

## 2020-10-18 DIAGNOSIS — E785 Hyperlipidemia, unspecified: Secondary | ICD-10-CM | POA: Diagnosis present

## 2020-10-18 DIAGNOSIS — A414 Sepsis due to anaerobes: Principal | ICD-10-CM | POA: Diagnosis present

## 2020-10-18 DIAGNOSIS — Z20822 Contact with and (suspected) exposure to covid-19: Secondary | ICD-10-CM | POA: Diagnosis present

## 2020-10-18 DIAGNOSIS — R062 Wheezing: Secondary | ICD-10-CM

## 2020-10-18 DIAGNOSIS — A0472 Enterocolitis due to Clostridium difficile, not specified as recurrent: Secondary | ICD-10-CM | POA: Diagnosis present

## 2020-10-18 DIAGNOSIS — Z808 Family history of malignant neoplasm of other organs or systems: Secondary | ICD-10-CM

## 2020-10-18 DIAGNOSIS — G4700 Insomnia, unspecified: Secondary | ICD-10-CM

## 2020-10-18 DIAGNOSIS — Z801 Family history of malignant neoplasm of trachea, bronchus and lung: Secondary | ICD-10-CM

## 2020-10-18 DIAGNOSIS — E876 Hypokalemia: Secondary | ICD-10-CM | POA: Diagnosis present

## 2020-10-18 DIAGNOSIS — F32A Depression, unspecified: Secondary | ICD-10-CM | POA: Diagnosis present

## 2020-10-18 DIAGNOSIS — E871 Hypo-osmolality and hyponatremia: Secondary | ICD-10-CM | POA: Diagnosis present

## 2020-10-18 DIAGNOSIS — I10 Essential (primary) hypertension: Secondary | ICD-10-CM | POA: Diagnosis present

## 2020-10-18 DIAGNOSIS — R112 Nausea with vomiting, unspecified: Secondary | ICD-10-CM | POA: Diagnosis present

## 2020-10-18 DIAGNOSIS — R42 Dizziness and giddiness: Secondary | ICD-10-CM | POA: Diagnosis present

## 2020-10-18 DIAGNOSIS — Z90710 Acquired absence of both cervix and uterus: Secondary | ICD-10-CM

## 2020-10-18 DIAGNOSIS — H9319 Tinnitus, unspecified ear: Secondary | ICD-10-CM | POA: Diagnosis present

## 2020-10-18 DIAGNOSIS — Z7982 Long term (current) use of aspirin: Secondary | ICD-10-CM

## 2020-10-18 DIAGNOSIS — Z79899 Other long term (current) drug therapy: Secondary | ICD-10-CM

## 2020-10-18 DIAGNOSIS — J9611 Chronic respiratory failure with hypoxia: Secondary | ICD-10-CM | POA: Diagnosis present

## 2020-10-18 DIAGNOSIS — Z880 Allergy status to penicillin: Secondary | ICD-10-CM

## 2020-10-18 DIAGNOSIS — F17210 Nicotine dependence, cigarettes, uncomplicated: Secondary | ICD-10-CM | POA: Diagnosis present

## 2020-10-18 DIAGNOSIS — E872 Acidosis: Secondary | ICD-10-CM | POA: Diagnosis present

## 2020-10-18 DIAGNOSIS — J449 Chronic obstructive pulmonary disease, unspecified: Secondary | ICD-10-CM | POA: Diagnosis present

## 2020-10-18 DIAGNOSIS — Z86010 Personal history of colonic polyps: Secondary | ICD-10-CM

## 2020-10-18 DIAGNOSIS — H269 Unspecified cataract: Secondary | ICD-10-CM | POA: Diagnosis present

## 2020-10-18 DIAGNOSIS — Z885 Allergy status to narcotic agent status: Secondary | ICD-10-CM

## 2020-10-18 LAB — CBC WITH DIFFERENTIAL/PLATELET
Abs Immature Granulocytes: 0.1 10*3/uL — ABNORMAL HIGH (ref 0.00–0.07)
Basophils Absolute: 0.1 10*3/uL (ref 0.0–0.1)
Basophils Relative: 0 %
Eosinophils Absolute: 0.3 10*3/uL (ref 0.0–0.5)
Eosinophils Relative: 2 %
HCT: 46.6 % — ABNORMAL HIGH (ref 36.0–46.0)
Hemoglobin: 16 g/dL — ABNORMAL HIGH (ref 12.0–15.0)
Immature Granulocytes: 1 %
Lymphocytes Relative: 17 %
Lymphs Abs: 3.2 10*3/uL (ref 0.7–4.0)
MCH: 31.6 pg (ref 26.0–34.0)
MCHC: 34.3 g/dL (ref 30.0–36.0)
MCV: 92.1 fL (ref 80.0–100.0)
Monocytes Absolute: 1.6 10*3/uL — ABNORMAL HIGH (ref 0.1–1.0)
Monocytes Relative: 9 %
Neutro Abs: 13.2 10*3/uL — ABNORMAL HIGH (ref 1.7–7.7)
Neutrophils Relative %: 71 %
Platelets: 255 10*3/uL (ref 150–400)
RBC: 5.06 MIL/uL (ref 3.87–5.11)
RDW: 12.8 % (ref 11.5–15.5)
WBC: 18.4 10*3/uL — ABNORMAL HIGH (ref 4.0–10.5)
nRBC: 0 % (ref 0.0–0.2)

## 2020-10-18 LAB — COMPREHENSIVE METABOLIC PANEL
ALT: 16 U/L (ref 0–44)
AST: 28 U/L (ref 15–41)
Albumin: 4.1 g/dL (ref 3.5–5.0)
Alkaline Phosphatase: 120 U/L (ref 38–126)
Anion gap: 13 (ref 5–15)
BUN: 18 mg/dL (ref 6–20)
CO2: 23 mmol/L (ref 22–32)
Calcium: 8.3 mg/dL — ABNORMAL LOW (ref 8.9–10.3)
Chloride: 98 mmol/L (ref 98–111)
Creatinine, Ser: 0.91 mg/dL (ref 0.44–1.00)
GFR, Estimated: >60 mL/min (ref >60)
Glucose, Bld: 120 mg/dL — ABNORMAL HIGH (ref 70–99)
Potassium: 3.6 mmol/L (ref 3.5–5.1)
Sodium: 134 mmol/L — ABNORMAL LOW (ref 135–145)
Total Bilirubin: 0.5 mg/dL (ref 0.3–1.2)
Total Protein: 7.5 g/dL (ref 6.5–8.1)

## 2020-10-18 LAB — URINALYSIS, ROUTINE W REFLEX MICROSCOPIC
Bilirubin Urine: NEGATIVE
Glucose, UA: NEGATIVE mg/dL
Hgb urine dipstick: NEGATIVE
Ketones, ur: NEGATIVE mg/dL
Leukocytes,Ua: NEGATIVE
Nitrite: NEGATIVE
Protein, ur: NEGATIVE mg/dL
Specific Gravity, Urine: 1.045 — ABNORMAL HIGH (ref 1.005–1.030)
pH: 5 (ref 5.0–8.0)

## 2020-10-18 LAB — LACTIC ACID, PLASMA
Lactic Acid, Venous: 2.8 mmol/L (ref 0.5–1.9)
Lactic Acid, Venous: 2.6 mmol/L (ref 0.5–1.9)

## 2020-10-18 LAB — RESP PANEL BY RT-PCR (FLU A&B, COVID) ARPGX2
Influenza A by PCR: NEGATIVE
Influenza B by PCR: NEGATIVE
SARS Coronavirus 2 by RT PCR: NEGATIVE

## 2020-10-18 LAB — TROPONIN I (HIGH SENSITIVITY)
Troponin I (High Sensitivity): 4 ng/L (ref <18)
Troponin I (High Sensitivity): 4 ng/L (ref <18)

## 2020-10-18 LAB — HIV ANTIBODY (ROUTINE TESTING W REFLEX): HIV Screen 4th Generation wRfx: NONREACTIVE

## 2020-10-18 LAB — LIPASE, BLOOD: Lipase: 33 U/L (ref 11–51)

## 2020-10-18 MED ORDER — UMECLIDINIUM BROMIDE 62.5 MCG/INH IN AEPB
1.0000 | INHALATION_SPRAY | Freq: Every day | RESPIRATORY_TRACT | Status: DC
  Administered 2020-10-18 – 2020-10-21 (×3): 1 via RESPIRATORY_TRACT
  Filled 2020-10-18 (×2): qty 7

## 2020-10-18 MED ORDER — SODIUM CHLORIDE 0.9 % IV BOLUS
1000.0000 mL | Freq: Once | INTRAVENOUS | Status: AC
  Administered 2020-10-18: 1000 mL via INTRAVENOUS

## 2020-10-18 MED ORDER — SODIUM CHLORIDE 0.9% FLUSH
3.0000 mL | Freq: Two times a day (BID) | INTRAVENOUS | Status: DC
  Administered 2020-10-18 – 2020-10-21 (×6): 3 mL via INTRAVENOUS

## 2020-10-18 MED ORDER — ASPIRIN EC 81 MG PO TBEC
81.0000 mg | DELAYED_RELEASE_TABLET | Freq: Every day | ORAL | Status: DC
  Administered 2020-10-19 – 2020-10-21 (×3): 81 mg via ORAL
  Filled 2020-10-18 (×3): qty 1

## 2020-10-18 MED ORDER — CITALOPRAM HYDROBROMIDE 10 MG PO TABS
60.0000 mg | ORAL_TABLET | Freq: Every day | ORAL | Status: DC
  Administered 2020-10-18: 60 mg via ORAL
  Filled 2020-10-18: qty 6

## 2020-10-18 MED ORDER — AZITHROMYCIN 250 MG PO TABS
500.0000 mg | ORAL_TABLET | Freq: Every day | ORAL | Status: DC
  Filled 2020-10-18: qty 2

## 2020-10-18 MED ORDER — MORPHINE SULFATE (PF) 2 MG/ML IV SOLN
2.0000 mg | INTRAVENOUS | Status: DC | PRN
Start: 2020-10-18 — End: 2020-10-21
  Administered 2020-10-18 – 2020-10-21 (×17): 2 mg via INTRAVENOUS
  Filled 2020-10-18 (×17): qty 1

## 2020-10-18 MED ORDER — SODIUM CHLORIDE (PF) 0.9 % IJ SOLN
INTRAMUSCULAR | Status: AC
  Administered 2020-10-18: 3 mL via INTRAVENOUS
  Filled 2020-10-18: qty 50

## 2020-10-18 MED ORDER — FENTANYL CITRATE (PF) 100 MCG/2ML IJ SOLN
50.0000 ug | Freq: Once | INTRAMUSCULAR | Status: AC
Start: 2020-10-18 — End: 2020-10-18
  Administered 2020-10-18: 50 ug via INTRAVENOUS
  Filled 2020-10-18: qty 2

## 2020-10-18 MED ORDER — PRAZOSIN HCL 1 MG PO CAPS
1.0000 mg | ORAL_CAPSULE | Freq: Every day | ORAL | Status: DC
  Administered 2020-10-18 – 2020-10-20 (×3): 1 mg via ORAL
  Filled 2020-10-18 (×3): qty 1

## 2020-10-18 MED ORDER — AMLODIPINE BESYLATE 5 MG PO TABS
5.0000 mg | ORAL_TABLET | Freq: Every day | ORAL | Status: DC
  Administered 2020-10-18 – 2020-10-21 (×4): 5 mg via ORAL
  Filled 2020-10-18 (×4): qty 1

## 2020-10-18 MED ORDER — LORATADINE 10 MG PO TABS: 20.0000 mg | ORAL_TABLET | Freq: Two times a day (BID) | ORAL | Status: DC

## 2020-10-18 MED ORDER — FLUTICASONE FUROATE-VILANTEROL 200-25 MCG/INH IN AEPB
1.0000 | INHALATION_SPRAY | Freq: Every day | RESPIRATORY_TRACT | Status: DC
  Administered 2020-10-18 – 2020-10-21 (×3): 1 via RESPIRATORY_TRACT
  Filled 2020-10-18 (×2): qty 28

## 2020-10-18 MED ORDER — ALPRAZOLAM 1 MG PO TABS
1.0000 mg | ORAL_TABLET | Freq: Every day | ORAL | Status: DC | PRN
  Administered 2020-10-20: 1 mg via ORAL
  Filled 2020-10-18: qty 1

## 2020-10-18 MED ORDER — SODIUM CHLORIDE 0.9 % IV SOLN: INTRAVENOUS | Status: AC

## 2020-10-18 MED ORDER — ALBUTEROL SULFATE (2.5 MG/3ML) 0.083% IN NEBU: 2.5000 mg | INHALATION_SOLUTION | RESPIRATORY_TRACT | Status: DC | PRN

## 2020-10-18 MED ORDER — AZITHROMYCIN 250 MG PO TABS
500.0000 mg | ORAL_TABLET | Freq: Every day | ORAL | Status: DC
  Administered 2020-10-18 – 2020-10-20 (×3): 500 mg via ORAL
  Filled 2020-10-18 (×2): qty 2

## 2020-10-18 MED ORDER — ASPIRIN EC 81 MG PO TBEC
81.0000 mg | DELAYED_RELEASE_TABLET | Freq: Two times a day (BID) | ORAL | Status: DC
  Administered 2020-10-18: 81 mg via ORAL
  Filled 2020-10-18: qty 1

## 2020-10-18 MED ORDER — LORATADINE 10 MG PO TABS
10.0000 mg | ORAL_TABLET | Freq: Every day | ORAL | Status: DC
  Administered 2020-10-18 – 2020-10-20 (×3): 10 mg via ORAL
  Filled 2020-10-18 (×3): qty 1

## 2020-10-18 MED ORDER — ACETAMINOPHEN 325 MG PO TABS
650.0000 mg | ORAL_TABLET | Freq: Four times a day (QID) | ORAL | Status: DC | PRN
  Administered 2020-10-19 – 2020-10-20 (×2): 650 mg via ORAL
  Filled 2020-10-18 (×2): qty 2

## 2020-10-18 MED ORDER — SODIUM CHLORIDE 0.9 % IV SOLN
2.0000 g | Freq: Once | INTRAVENOUS | Status: AC
  Administered 2020-10-18: 2 g via INTRAVENOUS
  Filled 2020-10-18: qty 20

## 2020-10-18 MED ORDER — CITALOPRAM HYDROBROMIDE 20 MG PO TABS
20.0000 mg | ORAL_TABLET | Freq: Every day | ORAL | Status: DC
  Administered 2020-10-19 – 2020-10-20 (×2): 20 mg via ORAL
  Filled 2020-10-18 (×2): qty 1

## 2020-10-18 MED ORDER — HYDROMORPHONE HCL 1 MG/ML IJ SOLN
0.5000 mg | Freq: Once | INTRAMUSCULAR | Status: AC
Start: 2020-10-18 — End: 2020-10-18
  Administered 2020-10-18: 0.5 mg via INTRAVENOUS
  Filled 2020-10-18: qty 1

## 2020-10-18 MED ORDER — ONDANSETRON HCL 4 MG/2ML IJ SOLN
4.0000 mg | Freq: Four times a day (QID) | INTRAMUSCULAR | Status: DC | PRN
  Administered 2020-10-19 – 2020-10-20 (×4): 4 mg via INTRAVENOUS
  Filled 2020-10-18 (×4): qty 2

## 2020-10-18 MED ORDER — FAMOTIDINE 20 MG PO TABS
20.0000 mg | ORAL_TABLET | Freq: Two times a day (BID) | ORAL | Status: DC
  Administered 2020-10-18 – 2020-10-21 (×7): 20 mg via ORAL
  Filled 2020-10-18 (×7): qty 1

## 2020-10-18 MED ORDER — GABAPENTIN 300 MG PO CAPS
300.0000 mg | ORAL_CAPSULE | Freq: Two times a day (BID) | ORAL | Status: DC
  Administered 2020-10-18 – 2020-10-21 (×7): 300 mg via ORAL
  Filled 2020-10-18 (×7): qty 1

## 2020-10-18 MED ORDER — PROCHLORPERAZINE EDISYLATE 10 MG/2ML IJ SOLN
10.0000 mg | Freq: Once | INTRAMUSCULAR | Status: AC
  Administered 2020-10-18: 10 mg via INTRAVENOUS
  Filled 2020-10-18: qty 2

## 2020-10-18 MED ORDER — ENOXAPARIN SODIUM 40 MG/0.4ML IJ SOSY
40.0000 mg | PREFILLED_SYRINGE | INTRAMUSCULAR | Status: DC
  Administered 2020-10-18 – 2020-10-21 (×4): 40 mg via SUBCUTANEOUS
  Filled 2020-10-18 (×3): qty 0.4

## 2020-10-18 MED ORDER — IOHEXOL 300 MG/ML  SOLN
100.0000 mL | Freq: Once | INTRAMUSCULAR | Status: AC | PRN
  Administered 2020-10-18: 100 mL via INTRAVENOUS

## 2020-10-18 MED ORDER — ALPRAZOLAM 0.5 MG PO TABS: 1.0000 mg | ORAL_TABLET | Freq: Three times a day (TID) | ORAL | Status: DC | PRN

## 2020-10-18 MED ORDER — ACETAMINOPHEN 650 MG RE SUPP: 650.0000 mg | Freq: Four times a day (QID) | RECTAL | Status: DC | PRN

## 2020-10-18 MED ORDER — ROPINIROLE HCL 1 MG PO TABS
1.0000 mg | ORAL_TABLET | Freq: Every day | ORAL | Status: DC
  Administered 2020-10-18 – 2020-10-20 (×3): 1 mg via ORAL
  Filled 2020-10-18 (×3): qty 1

## 2020-10-18 MED ORDER — HYDROCODONE-ACETAMINOPHEN 7.5-325 MG PO TABS
1.0000 | ORAL_TABLET | ORAL | Status: DC | PRN
  Administered 2020-10-21: 1 via ORAL
  Filled 2020-10-18: qty 1

## 2020-10-18 MED ORDER — TRAZODONE HCL 50 MG PO TABS
50.0000 mg | ORAL_TABLET | Freq: Every day | ORAL | Status: DC
  Administered 2020-10-18 – 2020-10-20 (×3): 50 mg via ORAL
  Filled 2020-10-18 (×3): qty 1

## 2020-10-18 MED ORDER — METOPROLOL TARTRATE 5 MG/5ML IV SOLN: 5.0000 mg | Freq: Four times a day (QID) | INTRAVENOUS | Status: DC | PRN

## 2020-10-18 MED ORDER — SODIUM CHLORIDE 0.9 % IV SOLN
12.5000 mg | Freq: Four times a day (QID) | INTRAVENOUS | Status: DC | PRN
  Administered 2020-10-18: 12.5 mg via INTRAVENOUS
  Filled 2020-10-18: qty 12.5

## 2020-10-18 MED ORDER — PANTOPRAZOLE SODIUM 40 MG IV SOLR
40.0000 mg | Freq: Once | INTRAVENOUS | Status: AC
  Administered 2020-10-18: 40 mg via INTRAVENOUS
  Filled 2020-10-18: qty 40

## 2020-10-18 MED ORDER — METRONIDAZOLE 500 MG/100ML IV SOLN
500.0000 mg | Freq: Once | INTRAVENOUS | Status: AC
  Administered 2020-10-18: 500 mg via INTRAVENOUS
  Filled 2020-10-18: qty 100

## 2020-10-18 MED ORDER — ONDANSETRON HCL 4 MG PO TABS
4.0000 mg | ORAL_TABLET | Freq: Four times a day (QID) | ORAL | Status: DC | PRN
  Administered 2020-10-19: 4 mg via ORAL
  Filled 2020-10-18: qty 1

## 2020-10-18 MED ORDER — SODIUM CHLORIDE 0.9 % IV SOLN
2.0000 g | INTRAVENOUS | Status: DC
  Administered 2020-10-19: 2 g via INTRAVENOUS
  Filled 2020-10-18: qty 2

## 2020-10-18 MED ORDER — MONTELUKAST SODIUM 10 MG PO TABS: 10.0000 mg | ORAL_TABLET | Freq: Every day | ORAL | Status: DC

## 2020-10-18 NOTE — ED Notes (Signed)
Hospitalist at the bedside 

## 2020-10-18 NOTE — H&P (Addendum)
History and Physical        Hospital Admission Note Date: 10/18/2020  Patient name: Vicki [name] Medical record number: [id number] Date of birth: 03-16-1966 Age: 55 y.o. Gender: female  PCP: Maximiano Coss, NP   Chief Complaint    Chief Complaint  Patient presents with  . Abdominal Pain      HPI:   This is a 55 year old female with past medical history of asthma, COPD on 3 L/min O2, emphysema, chronic urticaria, hypertension, hyperlipidemia diverticulitis, multiple abdominal surgeries and recent right ACL reconstruction who presented to the ED with generalized constant abdominal pain since yesterday.  Associated with nausea, vomiting, non bloody diarrhea and unable to keep anything down.  Feels her chest is tight and throat is burning and has had a cough.  Denies known fever, hematemesis, bloody BMs or dysuria or any other complaints.  No recent foreign travel or antibiotic use.  Of note, patient reports that she recently syncopized while in the shower.  States that this occurs occasionally while she is standing up and one time occurred while she was mowing the lawn.  Did not occur when going from sitting to standing.  Denied any chest pain.  Has not had any work-up for this.  ED Course: Afebrile, tachycardic, tachypneic,  hemodynamically stable, on room air. Notable Labs: Sodium 136, K3.6, BUN 18, creatinine 0.91, WBC 18.4, Hb 16.0, platelets 255. Notable Imaging: CXR with mid left base and right midlung subsegmental atelectasis/infiltrate.  CT abdomen pelvis with contrast- possible early sigmoid diverticulitis. Patient received fentanyl, Dilaudid, Protonix, Compazine, Phenergan, 2 L NS bolus, ceftriaxone and metronidazole.    Vitals:   10/18/20 0824 10/18/20 0836  BP: 108/72 103/86  Pulse: (!) 111 (!) 116  Resp: (!) 29 20  Temp:  98.3 F (36.8 C)  SpO2: 96% 93%      Review of Systems:  Review of Systems  All other systems reviewed and are negative.   Medical/Social/Family History   Past Medical History: Past Medical History:  Diagnosis Date  . Allergy    seasonal allergies  . Anxiety    on meds  . Arthritis    hands  . Asthma    childhood-current  . Carpal tunnel syndrome    had bilateral sx to tx  . Cataract    bilateral--no sx yet  . COPD (chronic obstructive pulmonary disease) ([place])   . Depression    on meds  . Diverticulitis   . Emphysema of lung ([place])    uses MDI  . Essential hypertension, benign 07/14/2019   on meds  . Hyperlipidemia    on meds  . Oxygen deficiency    home O2 at 2.5L/[place] nightly  . Oxygen dependent    uses O2 at 2.5L/[place] nightly    Past Surgical History:  Procedure Laterality Date  . ABDOMINAL HYSTERECTOMY  2009  . APPENDECTOMY    . ARTHROSCOPIC REPAIR ACL Left 2011/2014   x 2  . BIOPSY  01/21/2020   Procedure: BIOPSY;  Surgeon: Mauri Pole, MD;  Location: [facility];  Service: Endoscopy;;  EGD and COLON  . BUNIONECTOMY  2006   both  . CARPAL TUNNEL RELEASE Bilateral 10/28/2013   Procedure: BILATERAL CARPAL  TUNNEL RELEASE;  Surgeon: Wynonia Sours, MD;  Location: [place];  Service: Orthopedics;  Laterality: Bilateral;  . CHOLECYSTECTOMY  1994  . CHONDROPLASTY Right 08/31/2020   Procedure: CHONDROPLASTY;  Surgeon: Earlie Server, MD;  Location: [place];  Service: Orthopedics;  Laterality: Right;  . COLONOSCOPY WITH PROPOFOL N/A 01/21/2020   Procedure: COLONOSCOPY WITH PROPOFOL;  Surgeon: Mauri Pole, MD;  Location: [facility];  Service: Endoscopy;  Laterality: N/A;  . DILATION AND CURETTAGE OF UTERUS    . ESOPHAGOGASTRODUODENOSCOPY (EGD) WITH PROPOFOL N/A 01/21/2020   Procedure: ESOPHAGOGASTRODUODENOSCOPY (EGD) WITH PROPOFOL;  Surgeon: Mauri Pole, MD;  Location: [facility];  Service: Endoscopy;  Laterality: N/A;  . KNEE ARTHROSCOPY WITH  ANTERIOR CRUCIATE LIGAMENT (ACL) REPAIR Right 08/31/2020   Procedure: RIGHT KNEE ARTHROSCOPY WITH ANTERIOR CRUCIATE LIGAMENT (ACL) REPAIR MEDIAL AND LATERAL  MENISECTOMY;  Surgeon: Earlie Server, MD;  Location: [place];  Service: Orthopedics;  Laterality: Right;  FEMORAL NERVE BLOCK  . POLYPECTOMY  01/21/2020   Procedure: POLYPECTOMY;  Surgeon: Mauri Pole, MD;  Location: [facility];  Service: Endoscopy;;  . TUBAL LIGATION  2004    Medications: Prior to Admission medications   Medication Sig Start Date End Date Taking? Authorizing Provider  acetaminophen (TYLENOL) 500 MG tablet Take 1,000 mg by mouth every 6 (six) hours as needed for moderate pain.    [provider]  albuterol (VENTOLIN HFA) 108 (90 Base) MCG/ACT inhaler Inhale 2 puffs into the lungs 2 (two) times daily. 06/13/20   Maximiano Coss, NP  ALPRAZolam Duanne Moron) 1 MG tablet Take 1 tablet (1 mg total) by mouth 3 (three) times daily as needed for anxiety. 06/13/20   Maximiano Coss, NP  amLODipine (NORVASC) 5 MG tablet Take 1 tablet (5 mg total) by mouth daily. 06/13/20   Maximiano Coss, NP  aspirin EC 81 MG tablet Take 1 tablet (81 mg total) by mouth 2 (two) times daily. 09/14/20 03/13/21  Maximiano Coss, NP  cetirizine (ZYRTEC) 10 MG tablet Take 1 tablet (10 mg total) by mouth daily. 09/14/20   Maximiano Coss, NP  cetirizine (ZYRTEC) 10 MG tablet Take 2 tablets (20mg ) in the morning and 2 tablets (20mg ) at night. 10/07/20   Valentina Shaggy, MD  citalopram (CELEXA) 20 MG tablet Take 3 tablets (60 mg total) by mouth daily. 09/14/20   Maximiano Coss, NP  eletriptan (RELPAX) 40 MG tablet TAKE 1 TABLET AT ONSET OF MIGRAINE OR HEADACHE AS NEEDED. MAY REPEAT IN 2 HOURS IF SYMPTOMS PERSIST. 09/14/20   Maximiano Coss, NP  famotidine (PEPCID) 20 MG tablet Take 20 mg in the morning and at night. 10/07/20   Valentina Shaggy, MD  fexofenadine (ALLEGRA) 180 MG tablet Take 1 tablet midday daily. 10/07/20    Valentina Shaggy, MD  Fluticasone-Salmeterol (ADVAIR DISKUS) 500-50 MCG/DOSE AEPB Inhale 1 puff into the lungs in the morning and at bedtime. 06/13/20   Maximiano Coss, NP  gabapentin (NEURONTIN) 300 MG capsule Take 1 capsule (300 mg total) by mouth 2 (two) times daily. 06/13/20   Maximiano Coss, NP  HYDROcodone-acetaminophen (NORCO) 7.5-325 MG tablet Take 1 tablet by mouth every 4 (four) hours as needed for moderate pain. 08/31/20   [place]adwell, Vonna Kotyk, PA-C  hydrOXYzine (ATARAX/VISTARIL) 25 MG tablet Take 0.5-1 tablets (12.5-25 mg total) by mouth 3 (three) times daily as needed. 09/14/20   Maximiano Coss, NP  INCRUSE ELLIPTA 62.5 MCG/INH AEPB TAKE 1 PUFF BY MOUTH EVERY DAY 09/24/19   Pamella Pert  Claudie Leach, MD  Menthol, Topical Analgesic, (BIOFREEZE EX) Apply 1 application topically daily as needed (back pain).    [provider]  montelukast (SINGULAIR) 10 MG tablet Take 1 tablet (10 mg total) by mouth at bedtime. 10/04/20   Maximiano Coss, NP  omeprazole (PRILOSEC) 20 MG capsule TAKE 1 CAPSULE BY MOUTH TWICE A DAY BEFORE A MEAL Patient taking differently: Take 20 mg by mouth 2 (two) times daily. 11/09/19   Daleen Squibb, MD  ondansetron (ZOFRAN) 8 MG tablet Take 8 mg by mouth every 8 (eight) hours as needed for nausea or vomiting.    [provider]  pantoprazole (PROTONIX) 40 MG tablet Take 1 tablet (40 mg total) by mouth daily. 06/13/20   Maximiano Coss, NP  prazosin (MINIPRESS) 1 MG capsule TAKE 1 TO 2 CAPSULES (1 TO 2 MG TOTAL) BY MOUTH AT BEDTIME. 06/13/20   Maximiano Coss, NP  promethazine (PHENERGAN) 25 MG tablet Take 1 tablet (25 mg total) by mouth daily. 06/13/20   Maximiano Coss, NP  rOPINIRole (REQUIP) 2 MG tablet Take 0.5 tablets (1 mg total) by mouth at bedtime. 06/13/20   Maximiano Coss, NP  STIOLTO RESPIMAT 2.5-2.5 MCG/ACT AERS  10/06/20   [provider]  Tetrahydrozoline HCl (VISINE OP) Place 1 drop into both eyes daily as needed (dry eyes).     [provider]  traZODone (DESYREL) 50 MG tablet TAKE 1 TABLET BY MOUTH EVERYDAY AT BEDTIME Patient taking differently: Take 50 mg by mouth at bedtime. 06/02/19   Daleen Squibb, MD  triamcinolone cream (KENALOG) 0.1 % Apply 1 application topically 2 (two) times daily. 09/14/20   Maximiano Coss, NP    Allergies:   Allergies  Allergen Reactions  . Jasmine Oil Shortness Of Breath and Swelling  . Levaquin [Levofloxacin] Shortness Of Breath, Itching, Swelling and Rash  . Penicillins Anaphylaxis and Hives    Tolerated ceftriaxone  . Aleve [Naproxen Sodium] Itching  . Ceftin [Cefuroxime Axetil] Other (See Comments)    unsure  . Depo-Provera [Medroxyprogesterone Acetate] Other (See Comments)    Severe acne  . Doxycycline Other (See Comments)    Raises blood pressure   . Oxycodone Nausea And Vomiting  . Tramadol Other (See Comments)    Kept her up all night    Social History:  reports that she has been smoking cigarettes. She has a 34.00 pack-year smoking history. She has never used smokeless tobacco. She reports current alcohol use. She reports that she does not use drugs.  Family History: Family History  Problem Relation Age of Onset  . Brain cancer Mother 10  . Lung cancer Mother 59  . Breast cancer Sister 63  . Ovarian cancer Sister 90  . Colon cancer Neg Hx   . Colon polyps Neg Hx   . Esophageal cancer Neg Hx   . Rectal cancer Neg Hx   . Stomach cancer Neg Hx      Objective   Physical Exam: Blood pressure 103/86, pulse (!) 116, temperature 98.3 F (36.8 C), temperature source Oral, resp. rate 20, height 5\' 3"  (1.6 m), weight 77.1 kg, SpO2 93 %.  Physical Exam Vitals and nursing note reviewed.  Constitutional:      Appearance: Normal appearance.  HENT:     Head: Normocephalic and atraumatic.  Eyes:     Conjunctiva/sclera: Conjunctivae normal.  Cardiovascular:     Rate and Rhythm: Regular rhythm. Tachycardia present.  Pulmonary:     Effort:  Pulmonary effort  is normal.     Breath sounds: Wheezing present.  Abdominal:     General: Abdomen is flat.     Palpations: Abdomen is soft.     Tenderness: There is generalized abdominal tenderness and tenderness in the left lower quadrant.  Musculoskeletal:        General: No swelling or tenderness.  Skin:    Coloration: Skin is not jaundiced or pale.  Neurological:     Mental Status: She is alert. Mental status is at baseline.  Psychiatric:        Mood and Affect: Mood normal.        Behavior: Behavior normal.     LABS on Admission: I have personally reviewed all the labs and imaging below    Basic Metabolic Panel: Recent Labs  Lab 10/18/20 0356  NA 134*  K 3.6  CL 98  CO2 23  GLUCOSE 120*  BUN 18  CREATININE 0.91  CALCIUM 8.3*   Liver Function Tests: Recent Labs  Lab 10/18/20 0356  AST 28  ALT 16  ALKPHOS 120  BILITOT 0.5  PROT 7.5  ALBUMIN 4.1   Recent Labs  Lab 10/18/20 0356  LIPASE 33   No results for input(s): AMMONIA in the last 168 hours. CBC: Recent Labs  Lab 10/18/20 0356  WBC 18.4*  NEUTROABS 13.2*  HGB 16.0*  HCT 46.6*  MCV 92.1  PLT 255   Cardiac Enzymes: No results for input(s): CKTOTAL, CKMB, CKMBINDEX, TROPONINI in the last 168 hours. BNP: Invalid input(s): POCBNP CBG: No results for input(s): GLUCAP in the last 168 hours.  Radiological Exams on Admission:  DG Chest 2 View  Result Date: 10/18/2020 CLINICAL DATA:  Chest pain. EXAM: CHEST - 2 VIEW COMPARISON:  07/28/2020.  04/07/2019.  CT 04/08/2019. FINDINGS: Mediastinum and hilar structures normal. Heart size normal. Mild left base and right mid lung subsegmental atelectasis/infiltrate. No pleural effusion or pneumothorax. IMPRESSION: Mild left base and right mid lung subsegmental atelectasis/infiltrate. Exam otherwise unremarkable. Electronically Signed   By: Marcello Moores  Register   On: 10/18/2020 05:33   CT Abdomen Pelvis W Contrast  Result Date: 10/18/2020 CLINICAL DATA:  Acute,  nonlocalized abdominal pain EXAM: CT ABDOMEN AND PELVIS WITH CONTRAST TECHNIQUE: Multidetector CT imaging of the abdomen and pelvis was performed using the standard protocol following bolus administration of intravenous contrast. CONTRAST:  145mL OMNIPAQUE IOHEXOL 300 MG/ML  SOLN COMPARISON:  12/06/2015 FINDINGS: Lower chest:  No contributory findings. Hepatobiliary: Hepatic steatosis.Cholecystectomy. Pancreas: Unremarkable. Spleen: Unremarkable. Adrenals/Urinary Tract: Negative adrenals. No hydronephrosis or stone. Unremarkable bladder. Stomach/Bowel: Mild peritoneal thickening and slight fat stranding around 1 of multiple sigmoid diverticula. No bowel obstruction. No appendicitis. Vascular/Lymphatic: No acute vascular abnormality. Atheromatous calcification of the aorta. No mass or adenopathy. Reproductive:Hysterectomy. Other: No ascites or pneumoperitoneum. Musculoskeletal: No acute abnormalities. Lower lumbar disc degeneration and epidural lipomatosis. IMPRESSION: 1. Possible early sigmoid diverticulitis. 2. Hepatic steatosis. 3. Aortic atherosclerosis. Electronically Signed   By: Monte Fantasia M.D.   On: 10/18/2020 05:43      EKG: sinus tachycardia   A & P   Principal Problem:   Diverticulitis Active Problems:   COPD (chronic obstructive pulmonary disease) (HCC)   Depression   CAP (community acquired pneumonia)   Essential hypertension, benign   Generalized anxiety disorder   Nausea and vomiting   Chronic urticaria   1. Acute sigmoid diverticulitis a. History of penicillin allergy but has tolerated cephalosporins b. Screening colonoscopy on 01/21/2020 with mild sigmoid diverticulosis and 1 8 mm transverse  colon polyp c. No abscess noted on CT d. Continue ceftriaxone and metronidazole e. Clear liquid diet, advance as tolerated f. Continue IV fluids g. Antiemetics h. Morphine for severe pain  2. Concern for syncope of unknown etiology a. Reports that she has occasional syncopal  episodes while standing, most recently 2 weeks ago while in the shower b. Continue telemetry as there is concern for cardiogenic etiology given recurrent sudden onset while standing c. PT eval d. May need Holter monitor set up at discharge  3. Chronic hypoxic respiratory failure in the setting of COPD with shortness of breath concerning for atelectasis versus CAP a. Tolerating baseline O2 requirement of 3 L/min, not in an acute COPD exacerbation b. Does have a minimally productive cough and questionable infiltrate on CXR c. Will add azithromycin to current antibiotics for CAP coverage d. Incentive spirometry e. Continue home Advair, Incruse Ellipta, Singulair, and as needed albuterol f. Incentive spirometry  4. Hypertension a. Continue home amlodipine and terazosin  5. Chronic urticaria a. On high doses of Zyrtec outpatient which was switched to loratadine while inpatient  6. Anxiety/depression a. Continue home meds     DVT prophylaxis: Lovenox   Code Status: Full Code  Diet: Clear liquid diet Family Communication: Admission, patients condition and plan of care including tests being ordered have been discussed with the patient who indicates understanding and agrees with the plan and Code Status.  Disposition Plan: The appropriate patient status for this patient is OBSERVATION. Observation status is judged to be reasonable and necessary in order to provide the required intensity of service to ensure the patient's safety. The patient's presenting symptoms, physical exam findings, and initial radiographic and laboratory data in the context of their medical condition is felt to place them at decreased risk for further clinical deterioration. Furthermore, it is anticipated that the patient will be medically stable for discharge from the hospital within 2 midnights of admission. The following factors support the patient status of observation.   " The patient's presenting symptoms include  nausea, vomiting, abdominal pain. " The physical exam findings include abdominal pain. " The initial radiographic and laboratory data are sigmoid diverticulitis.     Consultants  . None  Procedures  . None  Time Spent on Admission: 60 minutes    Harold Hedge, DO [facility]  10/18/2020, 9:59 AM

## 2020-10-18 NOTE — ED Notes (Signed)
Lactic sent to lab at this time.

## 2020-10-18 NOTE — ED Notes (Signed)
Pt A&O x4. Attached to cardiac monitor x3. Hospitalist at the bedside and aware of pt's tachycardic HR.

## 2020-10-18 NOTE — ED Triage Notes (Signed)
Pt also had some diarrhea when EMS arrived at her house

## 2020-10-18 NOTE — ED Notes (Signed)
Pt departed the dept via stretcher  with ED tech transporting.

## 2020-10-18 NOTE — ED Triage Notes (Signed)
Pt complains of nausea, vomiting for 24 hours Pt states she had recent ACL surgery about one month ago  EMS gave 4mg  zofran and 250 NS in route Pt also wears 3 liters of O2 at night

## 2020-10-18 NOTE — ED Notes (Signed)
Pharmacy paged to verify 1000am meds.

## 2020-10-18 NOTE — ED Provider Notes (Signed)
[facility] DEPT Provider Note   CSN: [id number] Arrival date & time: 10/18/20  9702     History Chief Complaint  Patient presents with  . Abdominal Pain    Vicki [name] is a 55 y.o. female with a hx of COPD with chronic respiratory failure on 3 L via nasal cannula at baseline, hypertension, hyperlipidemia, diverticulitis, and prior abdominal surgeries including appendectomy, cholecystectomy, tubal ligation, and hysterectomy who presents to the emergency department via EMS with complaints of abdominal pain since yesterday. Patient states that the pain is to the generalized abdomen, constant, no alleviating/aggravating factors with associated too numerous to count episodes of emesis/diarrhea. She is unable to keep anything down. She states her chest feels tight and her throat is burning as well and she has had a cough. She denies fever, hematemesis, melena, hematochezia, or dysuria. Denies recent foreign travel or abx use. Was on steroids recently for an allergic reaction.    HPI     Past Medical History:  Diagnosis Date  . Allergy    seasonal allergies  . Anxiety    on meds  . Arthritis    hands  . Asthma    childhood-current  . Carpal tunnel syndrome    had bilateral sx to tx  . Cataract    bilateral--no sx yet  . COPD (chronic obstructive pulmonary disease) ([place])   . Depression    on meds  . Diverticulitis   . Emphysema of lung ([place])    uses MDI  . Essential hypertension, benign 07/14/2019   on meds  . Hyperlipidemia    on meds  . Oxygen deficiency    home O2 at 2.5L/[place] nightly  . Oxygen dependent    uses O2 at 2.5L/[place] nightly    Patient Active Problem List   Diagnosis Date Noted  . Chronic urticaria 10/07/2020  . Multiple drug allergies 10/07/2020  . Anterior cruciate ligament complete tear 08/31/2020  . Preop pulmonary/respiratory exam 07/28/2020  . Polyp of transverse colon   . Colon cancer screening   . Generalized  abdominal pain   . Nausea and vomiting   . Mucosal abnormality of rectum   . Elevated triglycerides with high cholesterol 07/14/2019  . RLS (restless legs syndrome) 07/14/2019  . Tobacco use disorder 07/14/2019  . Essential hypertension, benign 07/14/2019  . Generalized anxiety disorder 07/14/2019  . CAP (community acquired pneumonia) 04/07/2019  . UTI (urinary tract infection) 04/07/2019  . SIRS (systemic inflammatory response syndrome) ([place]) 04/07/2019  . Suspected COVID-19 virus infection 04/07/2019  . Viral gastroenteritis 04/07/2019  . Fatigue 11/07/2018  . Depression 11/07/2018  . Insomnia 11/07/2018  . Polypharmacy 07/19/2017  . Asthma   . Carpal tunnel syndrome   . COPD (chronic obstructive pulmonary disease) ([place])   . DDD (degenerative disc disease), lumbar 10/22/2016    Past Surgical History:  Procedure Laterality Date  . ABDOMINAL HYSTERECTOMY  2009  . APPENDECTOMY    . ARTHROSCOPIC REPAIR ACL Left 2011/2014   x 2  . BIOPSY  01/21/2020   Procedure: BIOPSY;  Surgeon: Mauri Pole, MD;  Location: [facility];  Service: Endoscopy;;  EGD and COLON  . BUNIONECTOMY  2006   both  . CARPAL TUNNEL RELEASE Bilateral 10/28/2013   Procedure: BILATERAL CARPAL TUNNEL RELEASE;  Surgeon: Wynonia Sours, MD;  Location: [facility];  Service: Orthopedics;  Laterality: Bilateral;  . CHOLECYSTECTOMY  1994  . CHONDROPLASTY Right 08/31/2020   Procedure: CHONDROPLASTY;  Surgeon: Earlie Server, MD;  Location: [facility];  Service: Orthopedics;  Laterality: Right;  . COLONOSCOPY WITH PROPOFOL N/A 01/21/2020   Procedure: COLONOSCOPY WITH PROPOFOL;  Surgeon: Mauri Pole, MD;  Location: [facility];  Service: Endoscopy;  Laterality: N/A;  . DILATION AND CURETTAGE OF UTERUS    . ESOPHAGOGASTRODUODENOSCOPY (EGD) WITH PROPOFOL N/A 01/21/2020   Procedure: ESOPHAGOGASTRODUODENOSCOPY (EGD) WITH PROPOFOL;  Surgeon: Mauri Pole, MD;  Location: [facility];  Service: Endoscopy;  Laterality: N/A;  . KNEE ARTHROSCOPY WITH ANTERIOR CRUCIATE LIGAMENT (ACL) REPAIR Right 08/31/2020   Procedure: RIGHT KNEE ARTHROSCOPY WITH ANTERIOR CRUCIATE LIGAMENT (ACL) REPAIR MEDIAL AND LATERAL  MENISECTOMY;  Surgeon: Earlie Server, MD;  Location: [facility];  Service: Orthopedics;  Laterality: Right;  FEMORAL NERVE BLOCK  . POLYPECTOMY  01/21/2020   Procedure: POLYPECTOMY;  Surgeon: Mauri Pole, MD;  Location: [facility];  Service: Endoscopy;;  . TUBAL LIGATION  2004     OB History   No obstetric history on file.     Family History  Problem Relation Age of Onset  . Brain cancer Mother 71  . Lung cancer Mother 62  . Breast cancer Sister 69  . Ovarian cancer Sister 32  . Colon cancer Neg Hx   . Colon polyps Neg Hx   . Esophageal cancer Neg Hx   . Rectal cancer Neg Hx   . Stomach cancer Neg Hx     Social History   Tobacco Use  . Smoking status: Current Every Day Smoker    Packs/day: 1.00    Years: 34.00    Pack years: 34.00    Types: Cigarettes  . Smokeless tobacco: Never Used  Vaping Use  . Vaping Use: Never used  Substance Use Topics  . Alcohol use: Yes    Comment: everyday  . Drug use: No    Home Medications Prior to Admission medications   Medication Sig Start Date End Date Taking? Authorizing Provider  acetaminophen (TYLENOL) 500 MG tablet Take 1,000 mg by mouth every 6 (six) hours as needed for moderate pain.    [provider]  albuterol (VENTOLIN HFA) 108 (90 Base) MCG/ACT inhaler Inhale 2 puffs into the lungs 2 (two) times daily. 06/13/20   Maximiano Coss, NP  ALPRAZolam Duanne Moron) 1 MG tablet Take 1 tablet (1 mg total) by mouth 3 (three) times daily as needed for anxiety. 06/13/20   Maximiano Coss, NP  amLODipine (NORVASC) 5 MG tablet Take 1 tablet (5 mg total) by mouth daily. 06/13/20   Maximiano Coss, NP  aspirin EC 81 MG tablet Take 1 tablet (81 mg total) by mouth 2 (two) times daily.  09/14/20 03/13/21  Maximiano Coss, NP  cetirizine (ZYRTEC) 10 MG tablet Take 1 tablet (10 mg total) by mouth daily. 09/14/20   Maximiano Coss, NP  cetirizine (ZYRTEC) 10 MG tablet Take 2 tablets (20mg ) in the morning and 2 tablets (20mg ) at night. 10/07/20   Valentina Shaggy, MD  citalopram (CELEXA) 20 MG tablet Take 3 tablets (60 mg total) by mouth daily. 09/14/20   Maximiano Coss, NP  eletriptan (RELPAX) 40 MG tablet TAKE 1 TABLET AT ONSET OF MIGRAINE OR HEADACHE AS NEEDED. MAY REPEAT IN 2 HOURS IF SYMPTOMS PERSIST. 09/14/20   Maximiano Coss, NP  famotidine (PEPCID) 20 MG tablet Take 20 mg in the morning and at night. 10/07/20   Valentina Shaggy, MD  fexofenadine (ALLEGRA) 180 MG tablet Take 1 tablet midday daily. 10/07/20   Valentina Shaggy, MD  Fluticasone-Salmeterol (ADVAIR DISKUS) 500-50 MCG/DOSE AEPB Inhale 1 puff into the lungs in the morning and at bedtime. 06/13/20   Maximiano Coss, NP  gabapentin (NEURONTIN) 300 MG capsule Take 1 capsule (300 mg total) by mouth 2 (two) times daily. 06/13/20   Maximiano Coss, NP  HYDROcodone-acetaminophen (NORCO) 7.5-325 MG tablet Take 1 tablet by mouth every 4 (four) hours as needed for moderate pain. 08/31/20   [place], Vonna Kotyk, PA-C  hydrOXYzine (ATARAX/VISTARIL) 25 MG tablet Take 0.5-1 tablets (12.5-25 mg total) by mouth 3 (three) times daily as needed. 09/14/20   Maximiano Coss, NP  INCRUSE ELLIPTA 62.5 MCG/INH AEPB TAKE 1 PUFF BY MOUTH EVERY DAY 09/24/19   Jacelyn Pi, Lilia Argue, MD  Menthol, Topical Analgesic, (BIOFREEZE EX) Apply 1 application topically daily as needed (back pain).    [provider]  montelukast (SINGULAIR) 10 MG tablet Take 1 tablet (10 mg total) by mouth at bedtime. 10/04/20   Maximiano Coss, NP  omeprazole (PRILOSEC) 20 MG capsule TAKE 1 CAPSULE BY MOUTH TWICE A DAY BEFORE A MEAL Patient taking differently: Take 20 mg by mouth 2 (two) times daily. 11/09/19   Daleen Squibb, MD  ondansetron (ZOFRAN) 8 MG tablet  Take 8 mg by mouth every 8 (eight) hours as needed for nausea or vomiting.    [provider]  pantoprazole (PROTONIX) 40 MG tablet Take 1 tablet (40 mg total) by mouth daily. 06/13/20   Maximiano Coss, NP  prazosin (MINIPRESS) 1 MG capsule TAKE 1 TO 2 CAPSULES (1 TO 2 MG TOTAL) BY MOUTH AT BEDTIME. 06/13/20   Maximiano Coss, NP  promethazine (PHENERGAN) 25 MG tablet Take 1 tablet (25 mg total) by mouth daily. 06/13/20   Maximiano Coss, NP  rOPINIRole (REQUIP) 2 MG tablet Take 0.5 tablets (1 mg total) by mouth at bedtime. 06/13/20   Maximiano Coss, NP  STIOLTO RESPIMAT 2.5-2.5 MCG/ACT AERS  10/06/20   [provider]  Tetrahydrozoline HCl (VISINE OP) Place 1 drop into both eyes daily as needed (dry eyes).    [provider]  traZODone (DESYREL) 50 MG tablet TAKE 1 TABLET BY MOUTH EVERYDAY AT BEDTIME Patient taking differently: Take 50 mg by mouth at bedtime. 06/02/19   Daleen Squibb, MD  triamcinolone cream (KENALOG) 0.1 % Apply 1 application topically 2 (two) times daily. 09/14/20   Maximiano Coss, NP    Allergies    Jasmine oil, Levaquin [levofloxacin], Penicillins, Aleve [naproxen sodium], Ceftin [cefuroxime axetil], Depo-provera [medroxyprogesterone acetate], Doxycycline, Oxycodone, and Tramadol  Review of Systems   Review of Systems  Constitutional: Negative for fever.  Respiratory: Positive for cough.   Cardiovascular: Positive for chest pain.  Gastrointestinal: Positive for abdominal pain, diarrhea, nausea and vomiting. Negative for blood in stool.  Genitourinary: Negative for dysuria.  Neurological: Negative for syncope.  All other systems reviewed and are negative.   Physical Exam Updated Vital Signs BP (!) 129/100   Pulse (!) 131   Temp 98.1 F (36.7 C) (Oral)   Resp 20   SpO2 96%   Physical Exam Vitals and nursing note reviewed.  Constitutional:      Appearance: She is well-developed. She is not toxic-appearing.     Comments: Actively  vomiting.   HENT:     Head: Normocephalic and atraumatic.     Mouth/Throat:     Comments: Dry mm Eyes:     General:        Right eye: No discharge.  Left eye: No discharge.     Conjunctiva/sclera: Conjunctivae normal.  Cardiovascular:     Rate and Rhythm: Regular rhythm. Tachycardia present.  Pulmonary:     Effort: Pulmonary effort is normal. No respiratory distress.     Breath sounds: Normal breath sounds. No wheezing, rhonchi or rales.  Abdominal:     General: There is no distension.     Palpations: Abdomen is soft.     Tenderness: There is generalized abdominal tenderness.  Musculoskeletal:     Cervical back: Neck supple.  Skin:    General: Skin is warm and dry.     Findings: No rash.  Neurological:     Mental Status: She is alert.     Comments: Clear speech.   Psychiatric:        Behavior: Behavior normal.     ED Results / Procedures / Treatments   Labs (all labs ordered are listed, but only abnormal results are displayed) Labs Reviewed  COMPREHENSIVE METABOLIC PANEL - Abnormal; Notable for the following components:      Result Value   Sodium 134 (*)    Glucose, Bld 120 (*)    Calcium 8.3 (*)    All other components within normal limits  CBC WITH DIFFERENTIAL/PLATELET - Abnormal; Notable for the following components:   WBC 18.4 (*)    Hemoglobin 16.0 (*)    HCT 46.6 (*)    Neutro Abs 13.2 (*)    Monocytes Absolute 1.6 (*)    Abs Immature Granulocytes 0.10 (*)    All other components within normal limits  LIPASE, BLOOD  URINALYSIS, ROUTINE W REFLEX MICROSCOPIC  TROPONIN I (HIGH SENSITIVITY)  TROPONIN I (HIGH SENSITIVITY)    EKG EKG Interpretation  Date/Time:  Tuesday October 18 2020 04:38:39 EDT Ventricular Rate:  95 PR Interval:  197 QRS Duration: 79 QT Interval:  345 QTC Calculation: 434 R Axis:   76 Text Interpretation: Sinus rhythm Borderline prolonged PR interval Probable anteroseptal infarct, old No acute changes Confirmed by Addison Lank [phone]) on 10/18/2020 4:41:06 AM   Radiology DG Chest 2 View  Result Date: 10/18/2020 CLINICAL DATA:  Chest pain. EXAM: CHEST - 2 VIEW COMPARISON:  07/28/2020.  04/07/2019.  CT 04/08/2019. FINDINGS: Mediastinum and hilar structures normal. Heart size normal. Mild left base and right mid lung subsegmental atelectasis/infiltrate. No pleural effusion or pneumothorax. IMPRESSION: Mild left base and right mid lung subsegmental atelectasis/infiltrate. Exam otherwise unremarkable. Electronically Signed   By: Marcello Moores  Register   On: 10/18/2020 05:33   CT Abdomen Pelvis W Contrast  Result Date: 10/18/2020 CLINICAL DATA:  Acute, nonlocalized abdominal pain EXAM: CT ABDOMEN AND PELVIS WITH CONTRAST TECHNIQUE: Multidetector CT imaging of the abdomen and pelvis was performed using the standard protocol following bolus administration of intravenous contrast. CONTRAST:  132mL OMNIPAQUE IOHEXOL 300 MG/ML  SOLN COMPARISON:  12/06/2015 FINDINGS: Lower chest:  No contributory findings. Hepatobiliary: Hepatic steatosis.Cholecystectomy. Pancreas: Unremarkable. Spleen: Unremarkable. Adrenals/Urinary Tract: Negative adrenals. No hydronephrosis or stone. Unremarkable bladder. Stomach/Bowel: Mild peritoneal thickening and slight fat stranding around 1 of multiple sigmoid diverticula. No bowel obstruction. No appendicitis. Vascular/Lymphatic: No acute vascular abnormality. Atheromatous calcification of the aorta. No mass or adenopathy. Reproductive:Hysterectomy. Other: No ascites or pneumoperitoneum. Musculoskeletal: No acute abnormalities. Lower lumbar disc degeneration and epidural lipomatosis. IMPRESSION: 1. Possible early sigmoid diverticulitis. 2. Hepatic steatosis. 3. Aortic atherosclerosis. Electronically Signed   By: Monte Fantasia M.D.   On: 10/18/2020 05:43    Procedures Procedures   Medications Ordered in ED Medications  sodium chloride (PF) 0.9 % injection (has no administration in time range)  promethazine  (PHENERGAN) 12.5 mg in sodium chloride 0.9 % 50 mL IVPB (0 mg Intravenous Stopped 10/18/20 0646)  cefTRIAXone (ROCEPHIN) 2 g in sodium chloride 0.9 % 100 mL IVPB (has no administration in time range)    And  metroNIDAZOLE (FLAGYL) IVPB 500 mg (has no administration in time range)  sodium chloride 0.9 % bolus 1,000 mL (0 mLs Intravenous Stopped 10/18/20 0601)  HYDROmorphone (DILAUDID) injection 0.5 mg (0.5 mg Intravenous Given 10/18/20 0422)  prochlorperazine (COMPAZINE) injection 10 mg (10 mg Intravenous Given 10/18/20 0421)  iohexol (OMNIPAQUE) 300 MG/ML solution 100 mL (100 mLs Intravenous Contrast Given 10/18/20 0505)  pantoprazole (PROTONIX) injection 40 mg (40 mg Intravenous Given 10/18/20 9233)  sodium chloride 0.9 % bolus 1,000 mL (1,000 mLs Intravenous New Bag/Given 10/18/20 0631)  fentaNYL (SUBLIMAZE) injection 50 mcg (50 mcg Intravenous Given 10/18/20 0630)    ED Course  I have reviewed the triage vital signs and the nursing notes.  Pertinent labs & imaging results that were available during my care of the patient were reviewed by me and considered in my medical decision making (see chart for details).    MDM Rules/Calculators/A&P                          Patient presents to the ED with complaints of abdominal pain w/ N/V/D abdominal pain and chest discomfort. Tachycardic on arrival.   Additional history obtained:  Additional history obtained from chart review & nursing note review.   EKG: Sinus tachycardia, no STEMI. Lab Tests:  I Ordered, reviewed, and interpreted labs, which included:  CBC: Leukocytosis at 18.4 with left shift. CMP: LFTs/ renal function WNL.  Lipase: WNL Troponin: WNL  Imaging Studies ordered:  I ordered imaging studies which included CXR & CT A/p , I independently reviewed, formal radiology impression shows:   CXR: Mild left base and right mid lung subsegmental atelectasis/infiltrate. Exam otherwise unremarkable  CT A/P: 1. Possible early sigmoid diverticulitis.  2. Hepatic steatosis. 3. Aortic atherosclerosis.  ED Course:  06:10: RE-EVAL: Patient states she remains nauseated, has had small amount of emesis S/p compazine, continues to have abdominal discomfort. Phenergan & protonix ordered.   CT findings with early diverticulitis.  Discussed antibiotic session with pharmacist, will proceed with IV Rocephin and IV Flagyl, patient has tolerated each of these previously.  Chest x-ray with findings of atelectasis versus infiltrate, COVID test is pending, may need additional antibiotic respiratory coverage if her COVID test is negative.  Patient continues to feel nauseated, she does not feel comfortable going home, given her diverticulitis, she meets her criteria, she is persistently tachycardic and symptomatic will discuss with hospitalist service for admission.  Findings and plan of care discussed with supervising physician Dr. Waldemar Dickens who has evaluated the patient & is in agreement.   07:15: CONSULT: Discussed with hospitalist Dr. Neysa Bonito- accepts admission.   Portions of this note were generated with [employer]. Dictation errors may occur despite best attempts at proofreading.  Final Clinical Impression(s) / ED Diagnoses Final diagnoses:  Diverticulitis    Rx / DC Orders ED Discharge Orders    None       Amaryllis Dyke, PA-C 10/18/20 0076    Fatima Blank, MD 10/18/20 [phone]

## 2020-10-18 NOTE — ED Notes (Signed)
Lactic 2.8. Dr. Dr. Neysa Bonito notified and aware. No additional orders at this time. Will continue to monitor.

## 2020-10-19 ENCOUNTER — Inpatient Hospital Stay (HOSPITAL_COMMUNITY): Payer: 59

## 2020-10-19 ENCOUNTER — Other Ambulatory Visit: Payer: Self-pay

## 2020-10-19 DIAGNOSIS — Z880 Allergy status to penicillin: Secondary | ICD-10-CM | POA: Diagnosis not present

## 2020-10-19 DIAGNOSIS — J690 Pneumonitis due to inhalation of food and vomit: Secondary | ICD-10-CM | POA: Diagnosis present

## 2020-10-19 DIAGNOSIS — H269 Unspecified cataract: Secondary | ICD-10-CM | POA: Diagnosis present

## 2020-10-19 DIAGNOSIS — E872 Acidosis: Secondary | ICD-10-CM | POA: Diagnosis present

## 2020-10-19 DIAGNOSIS — F32A Depression, unspecified: Secondary | ICD-10-CM | POA: Diagnosis present

## 2020-10-19 DIAGNOSIS — A0472 Enterocolitis due to Clostridium difficile, not specified as recurrent: Secondary | ICD-10-CM | POA: Diagnosis present

## 2020-10-19 DIAGNOSIS — I10 Essential (primary) hypertension: Secondary | ICD-10-CM | POA: Diagnosis present

## 2020-10-19 DIAGNOSIS — J9611 Chronic respiratory failure with hypoxia: Secondary | ICD-10-CM | POA: Diagnosis present

## 2020-10-19 DIAGNOSIS — H9319 Tinnitus, unspecified ear: Secondary | ICD-10-CM | POA: Diagnosis present

## 2020-10-19 DIAGNOSIS — K5792 Diverticulitis of intestine, part unspecified, without perforation or abscess without bleeding: Secondary | ICD-10-CM | POA: Diagnosis present

## 2020-10-19 DIAGNOSIS — J439 Emphysema, unspecified: Secondary | ICD-10-CM | POA: Diagnosis present

## 2020-10-19 DIAGNOSIS — F411 Generalized anxiety disorder: Secondary | ICD-10-CM | POA: Diagnosis present

## 2020-10-19 DIAGNOSIS — R55 Syncope and collapse: Secondary | ICD-10-CM | POA: Diagnosis present

## 2020-10-19 DIAGNOSIS — A414 Sepsis due to anaerobes: Secondary | ICD-10-CM | POA: Diagnosis present

## 2020-10-19 DIAGNOSIS — E785 Hyperlipidemia, unspecified: Secondary | ICD-10-CM | POA: Diagnosis present

## 2020-10-19 DIAGNOSIS — Z9981 Dependence on supplemental oxygen: Secondary | ICD-10-CM | POA: Diagnosis not present

## 2020-10-19 DIAGNOSIS — E871 Hypo-osmolality and hyponatremia: Secondary | ICD-10-CM | POA: Diagnosis present

## 2020-10-19 DIAGNOSIS — Z885 Allergy status to narcotic agent status: Secondary | ICD-10-CM | POA: Diagnosis not present

## 2020-10-19 DIAGNOSIS — E876 Hypokalemia: Secondary | ICD-10-CM | POA: Diagnosis present

## 2020-10-19 DIAGNOSIS — Z888 Allergy status to other drugs, medicaments and biological substances status: Secondary | ICD-10-CM | POA: Diagnosis not present

## 2020-10-19 DIAGNOSIS — Z20822 Contact with and (suspected) exposure to covid-19: Secondary | ICD-10-CM | POA: Diagnosis present

## 2020-10-19 DIAGNOSIS — Z90710 Acquired absence of both cervix and uterus: Secondary | ICD-10-CM | POA: Diagnosis not present

## 2020-10-19 DIAGNOSIS — L509 Urticaria, unspecified: Secondary | ICD-10-CM | POA: Diagnosis present

## 2020-10-19 DIAGNOSIS — K5732 Diverticulitis of large intestine without perforation or abscess without bleeding: Secondary | ICD-10-CM | POA: Diagnosis present

## 2020-10-19 LAB — ALLERGENS W/COMP RFLX AREA 2
Alternaria Alternata IgE: <0.1 kU/L
Aspergillus Fumigatus IgE: <0.1 kU/L
Bermuda Grass IgE: <0.1 kU/L
Cedar, Mountain IgE: <0.1 kU/L
Cladosporium Herbarum IgE: <0.1 kU/L
Cockroach, German IgE: <0.1 kU/L
Common Silver Birch IgE: <0.1 kU/L
Cottonwood IgE: <0.1 kU/L
D Farinae IgE: <0.1 kU/L
D Pteronyssinus IgE: <0.1 kU/L
E001-IgE Cat Dander: <0.1 kU/L
E005-IgE Dog Dander: <0.1 kU/L
Elm, American IgE: <0.1 kU/L
IgE (Immunoglobulin E), Serum: 199 IU/mL (ref 6–495)
Johnson Grass IgE: <0.1 kU/L
Maple/Box Elder IgE: <0.1 kU/L
Mouse Urine IgE: <0.1 kU/L
Oak, White IgE: <0.1 kU/L
Pecan, Hickory IgE: <0.1 kU/L
Penicillium Chrysogen IgE: <0.1 kU/L
Pigweed, Rough IgE: <0.1 kU/L
Ragweed, Short IgE: <0.1 kU/L
Sheep Sorrel IgE Qn: <0.1 kU/L
Timothy Grass IgE: <0.1 kU/L
White Mulberry IgE: <0.1 kU/L

## 2020-10-19 LAB — ALLERGEN PROFILE, BASIC FOOD
Allergen Corn, IgE: <0.1 kU/L
Beef IgE: <0.1 kU/L
Chocolate/Cacao IgE: <0.1 kU/L
Egg, Whole IgE: <0.1 kU/L
Food Mix (Seafoods) IgE: NEGATIVE
Milk IgE: <0.1 kU/L
Peanut IgE: <0.1 kU/L
Pork IgE: <0.1 kU/L
Soybean IgE: <0.1 kU/L
Wheat IgE: <0.1 kU/L

## 2020-10-19 LAB — CBC
HCT: 38.7 % (ref 36.0–46.0)
Hemoglobin: 12.8 g/dL (ref 12.0–15.0)
MCH: 31.1 pg (ref 26.0–34.0)
MCHC: 33.1 g/dL (ref 30.0–36.0)
MCV: 93.9 fL (ref 80.0–100.0)
Platelets: 148 10*3/uL — ABNORMAL LOW (ref 150–400)
RBC: 4.12 MIL/uL (ref 3.87–5.11)
RDW: 12.4 % (ref 11.5–15.5)
WBC: 7.6 10*3/uL (ref 4.0–10.5)
nRBC: 0 % (ref 0.0–0.2)

## 2020-10-19 LAB — CMP14+EGFR
ALT: 13 IU/L (ref 0–32)
AST: 15 IU/L (ref 0–40)
Albumin/Globulin Ratio: 1.6 (ref 1.2–2.2)
Albumin: 4.3 g/dL (ref 3.8–4.9)
Alkaline Phosphatase: 133 IU/L — ABNORMAL HIGH (ref 44–121)
BUN/Creatinine Ratio: 14 (ref 9–23)
BUN: 12 mg/dL (ref 6–24)
Bilirubin Total: 0.2 mg/dL (ref 0.0–1.2)
CO2: 24 mmol/L (ref 20–29)
Calcium: 9.4 mg/dL (ref 8.7–10.2)
Chloride: 100 mmol/L (ref 96–106)
Creatinine, Ser: 0.86 mg/dL (ref 0.57–1.00)
Globulin, Total: 2.7 g/dL (ref 1.5–4.5)
Glucose: 85 mg/dL (ref 65–99)
Potassium: 3.9 mmol/L (ref 3.5–5.2)
Sodium: 140 mmol/L (ref 134–144)
Total Protein: 7 g/dL (ref 6.0–8.5)
eGFR: 81 mL/min/{1.73_m2} (ref >59)

## 2020-10-19 LAB — BASIC METABOLIC PANEL
Anion gap: 10 (ref 5–15)
BUN: 7 mg/dL (ref 6–20)
CO2: 22 mmol/L (ref 22–32)
Calcium: 7.8 mg/dL — ABNORMAL LOW (ref 8.9–10.3)
Chloride: 100 mmol/L (ref 98–111)
Creatinine, Ser: 0.61 mg/dL (ref 0.44–1.00)
GFR, Estimated: >60 mL/min (ref >60)
Glucose, Bld: 102 mg/dL — ABNORMAL HIGH (ref 70–99)
Potassium: 3.3 mmol/L — ABNORMAL LOW (ref 3.5–5.1)
Sodium: 132 mmol/L — ABNORMAL LOW (ref 135–145)

## 2020-10-19 LAB — CBC WITH DIFFERENTIAL
Basophils Absolute: 0 10*3/uL (ref 0.0–0.2)
Basos: 0 %
EOS (ABSOLUTE): 0.1 10*3/uL (ref 0.0–0.4)
Eos: 0 %
Hematocrit: 46.4 % (ref 34.0–46.6)
Hemoglobin: 15.4 g/dL (ref 11.1–15.9)
Immature Grans (Abs): 0.1 10*3/uL (ref 0.0–0.1)
Immature Granulocytes: 1 %
Lymphocytes Absolute: 5 10*3/uL — ABNORMAL HIGH (ref 0.7–3.1)
Lymphs: 36 %
MCH: 31.6 pg (ref 26.6–33.0)
MCHC: 33.2 g/dL (ref 31.5–35.7)
MCV: 95 fL (ref 79–97)
Monocytes Absolute: 1 10*3/uL — ABNORMAL HIGH (ref 0.1–0.9)
Monocytes: 7 %
Neutrophils Absolute: 7.7 10*3/uL — ABNORMAL HIGH (ref 1.4–7.0)
Neutrophils: 56 %
RBC: 4.88 x10E6/uL (ref 3.77–5.28)
RDW: 12.5 % (ref 11.7–15.4)
WBC: 13.9 10*3/uL — ABNORMAL HIGH (ref 3.4–10.8)

## 2020-10-19 LAB — C-REACTIVE PROTEIN: CRP: 4 mg/L (ref 0–10)

## 2020-10-19 LAB — STREP PNEUMONIAE URINARY ANTIGEN: Strep Pneumo Urinary Antigen: NEGATIVE

## 2020-10-19 LAB — C DIFFICILE QUICK SCREEN W PCR REFLEX
C Diff antigen: POSITIVE — AB
C Diff toxin: NEGATIVE

## 2020-10-19 LAB — SEDIMENTATION RATE: Sed Rate: 12 mm/hr (ref 0–40)

## 2020-10-19 LAB — ANTINUCLEAR ANTIBODIES, IFA: ANA Titer 1: NEGATIVE

## 2020-10-19 LAB — TRYPTASE: Tryptase: 7.9 ug/L (ref 2.2–13.2)

## 2020-10-19 LAB — CHRONIC URTICARIA: cu index: 2.5 (ref <10)

## 2020-10-19 LAB — CLOSTRIDIUM DIFFICILE BY PCR, REFLEXED: Toxigenic C. Difficile by PCR: POSITIVE — AB

## 2020-10-19 MED ORDER — METRONIDAZOLE 500 MG/100ML IV SOLN
500.0000 mg | Freq: Three times a day (TID) | INTRAVENOUS | Status: DC
  Administered 2020-10-19 – 2020-10-20 (×3): 500 mg via INTRAVENOUS
  Filled 2020-10-19 (×2): qty 100

## 2020-10-19 MED ORDER — SODIUM CHLORIDE 0.9 % IV SOLN
2.0000 g | INTRAVENOUS | Status: DC
  Administered 2020-10-19: 2 g via INTRAVENOUS
  Filled 2020-10-19: qty 20
  Filled 2020-10-19: qty 2

## 2020-10-19 MED ORDER — VANCOMYCIN HCL 125 MG PO CAPS
125.0000 mg | ORAL_CAPSULE | Freq: Four times a day (QID) | ORAL | Status: DC
  Administered 2020-10-19 – 2020-10-21 (×9): 125 mg via ORAL
  Filled 2020-10-19 (×10): qty 1

## 2020-10-19 MED ORDER — METRONIDAZOLE 500 MG/100ML IV SOLN
500.0000 mg | Freq: Three times a day (TID) | INTRAVENOUS | Status: DC
  Administered 2020-10-19: 500 mg via INTRAVENOUS
  Filled 2020-10-19 (×3): qty 100

## 2020-10-19 MED ORDER — DEXTROSE-NACL 5-0.9 % IV SOLN: INTRAVENOUS | Status: AC

## 2020-10-19 MED ORDER — POTASSIUM CHLORIDE 10 MEQ/100ML IV SOLN
10.0000 meq | INTRAVENOUS | Status: AC
  Administered 2020-10-19 (×4): 10 meq via INTRAVENOUS
  Filled 2020-10-19 (×4): qty 100

## 2020-10-19 MED ORDER — MECLIZINE HCL 25 MG PO TABS: 12.5000 mg | ORAL_TABLET | Freq: Three times a day (TID) | ORAL | Status: DC | PRN

## 2020-10-19 MED ORDER — SODIUM CHLORIDE 0.9 % IV SOLN
6.2500 mg | Freq: Four times a day (QID) | INTRAVENOUS | Status: DC | PRN
  Administered 2020-10-19 – 2020-10-20 (×2): 6.25 mg via INTRAVENOUS
  Filled 2020-10-19 (×3): qty 0.25

## 2020-10-19 NOTE — Progress Notes (Signed)
PROGRESS NOTE    Vicki [name]  JXB:[id number] DOB: 06-15-1965 DOA: 10/18/2020 PCP: Maximiano Coss, NP    Chief Complaint  Patient presents with  . Abdominal Pain    Brief Narrative:   History of chronic urticaria, asthma, COPD, chronic hypoxic respiratory failure on home oxygen 3 L, prior history of multiple abdominal surgeries and recent right ACL reconstruction, presented to the ED with abdominal pain, nausea and vomiting, also reports chest is tight throat is burning had a cough, also record syncope while taking a shower, States that this occurs occasionally while she is standing up and one time occurred while she was mowing the lawn  Subjective:  Continue to have n/v and diarrhea  left lower ab pain is intermittent, currently no pain Having cough, non production, this is new as well Reports feeling dizzy, + tinnitus , reports passed out several times in the last 6 months  Assessment & Plan:   Principal Problem:   Diverticulitis Active Problems:   COPD (chronic obstructive pulmonary disease) (HCC)   Depression   CAP (community acquired pneumonia)   Essential hypertension, benign   Generalized anxiety disorder   Nausea and vomiting   Chronic urticaria   C. difficile colitis   Sepsis presents on admission with sinus tachycardia, tachypnea, leukocytosis, lactic acidosis, source of infection including C. difficile colitis, sigmoid diverticulitis, pneumonia -Check urine Legionella and urine strep pneumo antigen, cannot rule out aspiration pneumonia since recurrent vomiting at home, cough with new -Was given Rocephin and Flagyl in the ED, currently on Rocephin only, will add back Flagyl, start oral vanc -Case discussed with infectious disease Dr. Baxter Flattery, will follow recommendation  N/v/d CT ab on presentation Persistent symptom, will get kub Keep npo, prn antiemetics, resume iv hydration  Hyponatremia -Resume IV hydration  Hypokalemia Replace K, check  mag  Syncope + tinnitus Start antivert Continue hydration Reports recurrent issues for the last 6 months, will send cards note for outpatient cardiac monitoring as well  COPD /chronic hypoxic respiratory failure on home O2 3 L  Asthma continue Singulair  Hypertension continue Norvasc, she is also on prazosin  Psych, stable On present, trazodone, hydroxyzine, Celexa  History of alcohol use Report has cut down significantly, only drink 2 drinks per month  Nutritional Assessment: The patient's BMI is: Body mass index is 30.11 kg/m.Marland Kitchen      Unresulted Labs (From admission, onward)          Start     Ordered   10/20/20 0500  CBC with Differential/Platelet  Tomorrow morning,   R       Question:  Specimen collection method  Answer:  Lab=Lab collect   10/19/20 2321   10/20/20 0500  Lactic acid, plasma  Tomorrow morning,   R       Question:  Specimen collection method  Answer:  Lab=Lab collect   10/19/20 2322   10/20/20 0500  Magnesium  Tomorrow morning,   R       Question:  Specimen collection method  Answer:  Lab=Lab collect   10/19/20 2322   10/19/20 0824  Legionella Pneumophila Serogp 1 Ur Ag  Once,   R        10/19/20 0824   10/19/20 1308  Basic metabolic panel  Daily,   R      10/18/20 0843            DVT prophylaxis: enoxaparin (LOVENOX) injection 40 mg Start: 10/18/20 1000   Code Status: Full Family Communication: Patient Disposition:  Status is: Inpatient  Dispo: The patient is from: Home              Anticipated d/c is to: Home              Anticipated d/c date is: To be determined, not medically ready                Consultants:   ID  Procedures:   None  Antimicrobials:   Anti-infectives (From admission, onward)   Start     Dose/Rate Route Frequency Ordered Stop   10/19/20 2000  metroNIDAZOLE (FLAGYL) IVPB 500 mg        500 mg 100 mL/hr over 60 Minutes Intravenous Every 8 hours 10/19/20 1853     10/19/20 2000  cefTRIAXone (ROCEPHIN) 2 g  in sodium chloride 0.9 % 100 mL IVPB        2 g 200 mL/hr over 30 Minutes Intravenous Every 24 hours 10/19/20 1854     10/19/20 1000  vancomycin (VANCOCIN) capsule 125 mg        125 mg Oral 4 times daily 10/19/20 0826 10/29/20 0959   10/19/20 0930  metroNIDAZOLE (FLAGYL) IVPB 500 mg  Status:  Discontinued        500 mg 100 mL/hr over 60 Minutes Intravenous Every 8 hours 10/19/20 0826 10/19/20 1852   10/19/20 0800  cefTRIAXone (ROCEPHIN) 2 g in sodium chloride 0.9 % 100 mL IVPB  Status:  Discontinued        2 g 200 mL/hr over 30 Minutes Intravenous Every 24 hours 10/18/20 0955 10/19/20 1852   10/18/20 1300  azithromycin (ZITHROMAX) tablet 500 mg        500 mg Oral Daily 10/18/20 1217 10/23/20 0959   10/18/20 1000  azithromycin (ZITHROMAX) tablet 500 mg  Status:  Discontinued        500 mg Oral Daily 10/18/20 0953 10/18/20 1217   10/18/20 0645  cefTRIAXone (ROCEPHIN) 2 g in sodium chloride 0.9 % 100 mL IVPB       "And" Linked Group Details   2 g 200 mL/hr over 30 Minutes Intravenous  Once 10/18/20 0641 10/18/20 0825   10/18/20 0645  metroNIDAZOLE (FLAGYL) IVPB 500 mg       "And" Linked Group Details   500 mg 100 mL/hr over 60 Minutes Intravenous  Once 10/18/20 0641 10/18/20 0952          Objective: Vitals:   10/19/20 0103 10/19/20 0503 10/19/20 1302 10/19/20 2241  BP: 105/75 127/87 (!) 144/105 110/72  Pulse: (!) 109 80 93 85  Resp: 20 15 20 18   Temp: 98 F (36.7 C) 98 F (36.7 C) 97.8 F (36.6 C) 99 F (37.2 C)  TempSrc: Oral Oral Oral Oral  SpO2: 92% 91% 95% 98%  Weight:      Height:        Intake/Output Summary (Last 24 hours) at 10/19/2020 2323 Last data filed at 10/19/2020 1800 Gross per 24 hour  Intake 229.06 ml  Output --  Net 229.06 ml   Filed Weights   10/18/20 0836  Weight: 77.1 kg    Examination:  General exam: calm, NAD Respiratory system: Clear to auscultation. Respiratory effort normal. Cardiovascular system: S1 & S2 heard, RRR. No JVD, no murmur,  No pedal edema. Gastrointestinal system: Abdomen is nondistended, soft and nontender. Normal bowel sounds heard. Central nervous system: Alert and oriented. No focal neurological deficits. Extremities: Symmetric 5 x 5 power. Skin: No rashes, lesions or ulcers Psychiatry:  Judgement and insight appear normal. Mood & affect appropriate.     Data Reviewed: I have personally reviewed following labs and imaging studies  CBC: Recent Labs  Lab 10/18/20 0356 10/19/20 0540  WBC 18.4* 7.6  NEUTROABS 13.2*  --   HGB 16.0* 12.8  HCT 46.6* 38.7  MCV 92.1 93.9  PLT 255 148*    Basic Metabolic Panel: Recent Labs  Lab 10/18/20 0356 10/19/20 0540  NA 134* 132*  K 3.6 3.3*  CL 98 100  CO2 23 22  GLUCOSE 120* 102*  BUN 18 7  CREATININE 0.91 0.61  CALCIUM 8.3* 7.8*    GFR: Estimated Creatinine Clearance: 80 mL/min (by C-G formula based on SCr of 0.61 mg/dL).  Liver Function Tests: Recent Labs  Lab 10/18/20 0356  AST 28  ALT 16  ALKPHOS 120  BILITOT 0.5  PROT 7.5  ALBUMIN 4.1    CBG: No results for input(s): GLUCAP in the last 168 hours.   Recent Results (from the past 240 hour(s))  Resp Panel by RT-PCR (Flu A&B, Covid) Nasopharyngeal Swab     Status: None   Collection Time: 10/18/20  8:32 AM   Specimen: Nasopharyngeal Swab; Nasopharyngeal(NP) swabs in vial transport medium  Result Value Ref Range Status   SARS Coronavirus 2 by RT PCR NEGATIVE NEGATIVE Final    Comment: (NOTE) SARS-CoV-2 target nucleic acids are NOT DETECTED.  The SARS-CoV-2 RNA is generally detectable in upper respiratory specimens during the acute phase of infection. The lowest concentration of SARS-CoV-2 viral copies this assay can detect is 138 copies/mL. A negative result does not preclude SARS-Cov-2 infection and should not be used as the sole basis for treatment or other patient management decisions. A negative result may occur with  improper specimen collection/handling, submission of  specimen other than nasopharyngeal swab, presence of viral mutation(s) within the areas targeted by this assay, and inadequate number of viral copies(<138 copies/mL). A negative result must be combined with clinical observations, patient history, and epidemiological information. The expected result is Negative.  Fact Sheet for Patients:  EntrepreneurPulse.com.au  Fact Sheet for Healthcare Providers:  IncredibleEmployment.be  This test is no t yet approved or cleared by the [place] FDA and  has been authorized for detection and/or diagnosis of SARS-CoV-2 by FDA under an Emergency Use Authorization (EUA). This EUA will remain  in effect (meaning this test can be used) for the duration of the COVID-19 declaration under Section 564(b)(1) of the Act, 21 U.S.C.section 360bbb-3(b)(1), unless the authorization is terminated  or revoked sooner.       Influenza A by PCR NEGATIVE NEGATIVE Final   Influenza B by PCR NEGATIVE NEGATIVE Final    Comment: (NOTE) The Xpert Xpress SARS-CoV-2/FLU/RSV plus assay is intended as an aid in the diagnosis of influenza from Nasopharyngeal swab specimens and should not be used as a sole basis for treatment. Nasal washings and aspirates are unacceptable for Xpert Xpress SARS-CoV-2/FLU/RSV testing.  Fact Sheet for Patients: EntrepreneurPulse.com.au  Fact Sheet for Healthcare Providers: IncredibleEmployment.be  This test is not yet approved or cleared by the [place] FDA and has been authorized for detection and/or diagnosis of SARS-CoV-2 by FDA under an Emergency Use Authorization (EUA). This EUA will remain in effect (meaning this test can be used) for the duration of the COVID-19 declaration under Section 564(b)(1) of the Act, 21 U.S.C. section 360bbb-3(b)(1), unless the authorization is terminated or revoked.  Performed at [facility], [address] [place], [place]  [address]   C Difficile Quick Screen w PCR reflex     Status: Abnormal   Collection Time: 10/18/20  1:44 PM   Specimen: STOOL  Result Value Ref Range Status   C Diff antigen POSITIVE (A) NEGATIVE Final   C Diff toxin NEGATIVE NEGATIVE Final   C Diff interpretation Results are indeterminate. See PCR results.  Final    Comment: Performed at [facility], [place] [address], [place], [place] [address]  C. Diff by PCR, Reflexed     Status: Abnormal   Collection Time: 10/18/20  1:44 PM  Result Value Ref Range Status   Toxigenic C. Difficile by PCR POSITIVE (A) NEGATIVE Final    Comment: Positive for toxigenic C. difficile with little to no toxin production. Only treat if clinical presentation suggests symptomatic illness. Performed at [facility], [place] [address] [place], [place] [address]          Radiology Studies: DG Chest 2 View  Result Date: 10/18/2020 CLINICAL DATA:  Chest pain. EXAM: CHEST - 2 VIEW COMPARISON:  07/28/2020.  04/07/2019.  CT 04/08/2019. FINDINGS: Mediastinum and hilar structures normal. Heart size normal. Mild left base and right mid lung subsegmental atelectasis/infiltrate. No pleural effusion or pneumothorax. IMPRESSION: Mild left base and right mid lung subsegmental atelectasis/infiltrate. Exam otherwise unremarkable. Electronically Signed   By: Marcello Moores  Register   On: 10/18/2020 05:33   DG Abd 1 View  Result Date: 10/19/2020 CLINICAL DATA:  Nausea and vomiting. EXAM: ABDOMEN - 1 VIEW COMPARISON:  None. FINDINGS: The bowel gas pattern is normal. No radio-opaque calculi or other significant radiographic abnormality are seen. Radiopaque surgical clips are seen within the right upper quadrant. IMPRESSION: Negative. Electronically Signed   By: Virgina Norfolk M.D.   On: 10/19/2020 18:51   CT Abdomen Pelvis W Contrast  Result Date: 10/18/2020 CLINICAL DATA:  Acute, nonlocalized abdominal pain EXAM: CT ABDOMEN AND PELVIS WITH CONTRAST  TECHNIQUE: Multidetector CT imaging of the abdomen and pelvis was performed using the standard protocol following bolus administration of intravenous contrast. CONTRAST:  184mL OMNIPAQUE IOHEXOL 300 MG/ML  SOLN COMPARISON:  12/06/2015 FINDINGS: Lower chest:  No contributory findings. Hepatobiliary: Hepatic steatosis.Cholecystectomy. Pancreas: Unremarkable. Spleen: Unremarkable. Adrenals/Urinary Tract: Negative adrenals. No hydronephrosis or stone. Unremarkable bladder. Stomach/Bowel: Mild peritoneal thickening and slight fat stranding around 1 of multiple sigmoid diverticula. No bowel obstruction. No appendicitis. Vascular/Lymphatic: No acute vascular abnormality. Atheromatous calcification of the aorta. No mass or adenopathy. Reproductive:Hysterectomy. Other: No ascites or pneumoperitoneum. Musculoskeletal: No acute abnormalities. Lower lumbar disc degeneration and epidural lipomatosis. IMPRESSION: 1. Possible early sigmoid diverticulitis. 2. Hepatic steatosis. 3. Aortic atherosclerosis. Electronically Signed   By: Monte Fantasia M.D.   On: 10/18/2020 05:43        Scheduled Meds: . amLODipine  5 mg Oral Daily  . aspirin EC  81 mg Oral Daily  . azithromycin  500 mg Oral Daily  . citalopram  20 mg Oral Daily  . enoxaparin (LOVENOX) injection  40 mg Subcutaneous Q24H  . famotidine  20 mg Oral BID  . fluticasone furoate-vilanterol  1 puff Inhalation Daily  . gabapentin  300 mg Oral BID  . loratadine  10 mg Oral Daily  . prazosin  1 mg Oral QHS  . rOPINIRole  1 mg Oral QHS  . sodium chloride flush  3 mL Intravenous Q12H  . traZODone  50 mg Oral QHS  . umeclidinium bromide  1 puff Inhalation Daily  . vancomycin  125 mg Oral  QID   Continuous Infusions: . cefTRIAXone (ROCEPHIN)  IV 2 g (10/19/20 2136)  . dextrose 5 % and 0.9% NaCl 75 mL/hr at 10/19/20 1908  . metronidazole 500 mg (10/19/20 2014)  . potassium chloride 10 mEq (10/19/20 2233)  . promethazine (PHENERGAN) injection (IM or IVPB)  6.25 mg (10/19/20 1751)     LOS: 0 days   Time spent: 110mins Greater than 50% of this time was spent in counseling, explanation of diagnosis, planning of further management, and coordination of care.   Voice Recognition Viviann Spare dictation system was used to create this note, attempts have been made to correct errors. Please contact the author with questions and/or clarifications.   Florencia Reasons, MD PhD FACP [facility]  Available via Epic secure chat 7am-7pm for nonurgent issues Please page for urgent issues To page the attending provider between 7A-7P or the covering provider during after hours 7P-7A, please log into the web site [email or web address] and access using universal [place] password for that web site. If you do not have the password, please call the hospital operator.    10/19/2020, 11:23 PM

## 2020-10-19 NOTE — Consult Note (Addendum)
[facility]  Total days of antibiotics 3               Reason for Consult: cdifficile and pneumonia   Referring Physician: xu  Principal Problem:   Diverticulitis Active Problems:   COPD (chronic obstructive pulmonary disease) ([place])   Depression   CAP (community acquired pneumonia)   Essential hypertension, benign   Generalized anxiety disorder   Nausea and vomiting   Chronic urticaria   C. difficile colitis    HPI: Vicki [name] is a 55 y.o. female with history of COPD, oxygen dependent, hx of chronic intermittent diarrhea, HLD, diverticulosis HTN, who was admitted for acute onset of diarrhea, N/v feeling poorly. She last received abtx when she underwent right ACL reconstruction in April. In addition to GI complaints ,she also reported chest tightness with wheezing and new cough. On admit, she was found to have leukocytosis of WBC 18.4K, CXR concerning for patchy infiltrate and CT showed possibly early sigmoid diverticulitis. She was started on ceftriaxone plus metronidazole and azithromycin to cover pneumonia plus diverticulitis. Her diarrhea work up revealed she had c.difficile thus oral vanco was also started. She is still having perfuse diarrhea. Cough is improved. Still having diffuse abdominal pain. Her leukocytosis is improved.  Past Medical History:  Diagnosis Date  . Allergy    seasonal allergies  . Anxiety    on meds  . Arthritis    hands  . Asthma    childhood-current  . Carpal tunnel syndrome    had bilateral sx to tx  . Cataract    bilateral--no sx yet  . COPD (chronic obstructive pulmonary disease) ([place])   . Depression    on meds  . Diverticulitis   . Emphysema of lung ([place])    uses MDI  . Essential hypertension, benign 07/14/2019   on meds  . Hyperlipidemia    on meds  . Oxygen deficiency    home O2 at 2.5L/[place] nightly  . Oxygen dependent    uses O2 at 2.5L/[place] nightly    Allergies:  Allergies  Allergen Reactions   . Jasmine Oil Shortness Of Breath and Swelling  . Levaquin [Levofloxacin] Shortness Of Breath, Itching, Swelling and Rash  . Penicillins Anaphylaxis and Hives    Tolerated ceftriaxone  . Aleve [Naproxen Sodium] Itching  . Ceftin [Cefuroxime Axetil] Other (See Comments)    unsure  . Depo-Provera [Medroxyprogesterone Acetate] Other (See Comments)    Severe acne  . Doxycycline Other (See Comments)    Raises blood pressure   . Oxycodone Nausea And Vomiting  . Tramadol Other (See Comments)    Kept her up all night    MEDICATIONS: . amLODipine  5 mg Oral Daily  . aspirin EC  81 mg Oral Daily  . azithromycin  500 mg Oral Daily  . citalopram  20 mg Oral Daily  . enoxaparin (LOVENOX) injection  40 mg Subcutaneous Q24H  . famotidine  20 mg Oral BID  . fluticasone furoate-vilanterol  1 puff Inhalation Daily  . gabapentin  300 mg Oral BID  . loratadine  10 mg Oral Daily  . prazosin  1 mg Oral QHS  . rOPINIRole  1 mg Oral QHS  . sodium chloride flush  3 mL Intravenous Q12H  . traZODone  50 mg Oral QHS  . umeclidinium bromide  1 puff Inhalation Daily  . vancomycin  125 mg Oral QID    Social History   Tobacco Use  . Smoking  status: Current Every Day Smoker    Packs/day: 1.00    Years: 34.00    Pack years: 34.00    Types: Cigarettes  . Smokeless tobacco: Never Used  Vaping Use  . Vaping Use: Never used  Substance Use Topics  . Alcohol use: Yes    Comment: everyday  . Drug use: No    Family History  Problem Relation Age of Onset  . Brain cancer Mother 30  . Lung cancer Mother 55  . Breast cancer Sister 61  . Ovarian cancer Sister 39  . Colon cancer Neg Hx   . Colon polyps Neg Hx   . Esophageal cancer Neg Hx   . Rectal cancer Neg Hx   . Stomach cancer Neg Hx      Review of Systems  Constitutional: Negative for fever, chills, diaphoresis, activity change, appetite change, fatigue and unexpected weight change.  HENT: Negative for congestion, sore throat, rhinorrhea,  sneezing, trouble swallowing and sinus pressure.  Eyes: Negative for photophobia and visual disturbance.  Respiratory: + cough, chest tightness, shortness of breath, wheezing and stridor.  Cardiovascular: Negative for chest pain, palpitations and leg swelling.  Gastrointestinal: + nausea, vomiting, abdominal pain, diarrhea, constipation, blood in stool, abdominal distention and anal bleeding.  Genitourinary: Negative for dysuria, hematuria, flank pain and difficulty urinating.  Musculoskeletal: Negative for myalgias, back pain, joint swelling, arthralgias and gait problem.  Skin: Negative for color change, pallor, rash and wound.  Neurological: Negative for dizziness, tremors, weakness and light-headedness.  Hematological: Negative for adenopathy. Does not bruise/bleed easily.  Psychiatric/Behavioral: Negative for behavioral problems, confusion, sleep disturbance, dysphoric mood, decreased concentration and agitation.     OBJECTIVE: Temp:  [97.8 F (36.6 C)-98.8 F (37.1 C)] 97.8 F (36.6 C) (06/08 1302) Pulse Rate:  [80-109] 93 (06/08 1302) Resp:  [15-22] 20 (06/08 1302) BP: (105-144)/(75-105) 144/105 (06/08 1302) SpO2:  [91 %-95 %] 95 % (06/08 1302) Physical Exam  Constitutional:  oriented to person, place, and time. appears well-developed, older than stated age with chronic illness and well-nourished. No distress.  HENT: [place]/AT, PERRLA, no scleral icterus Mouth/Throat: Oropharynx is clear and moist. No oropharyngeal exudate.  Cardiovascular: Normal rate, regular rhythm and normal heart sounds. Exam reveals no gallop and no friction rub.  No murmur heard.  Pulmonary/Chest: Effort normal and breath sounds normal. No respiratory distress. +wheezing Neck = supple, no nuchal rigidity Abdominal: Soft. Bowel sounds are normal.  exhibits no distension. Lower quadrant tenderness.  Lymphadenopathy: no cervical adenopathy. No axillary adenopathy Neurological: alert and oriented to person,  place, and time.  Skin: Skin is warm and dry. No rash noted. No erythema.  Psychiatric: a normal mood and affect.  behavior is normal.    LABS: Results for orders placed or performed during the hospital encounter of 10/18/20 (from the past 48 hour(s))  Comprehensive metabolic panel     Status: Abnormal   Collection Time: 10/18/20  3:56 AM  Result Value Ref Range   Sodium 134 (L) 135 - 145 mmol/L   Potassium 3.6 3.5 - 5.1 mmol/L   Chloride 98 98 - 111 mmol/L   CO2 23 22 - 32 mmol/L   Glucose, Bld 120 (H) 70 - 99 mg/dL    Comment: Glucose reference range applies only to samples taken after fasting for at least 8 hours.   BUN 18 6 - 20 mg/dL   Creatinine, Ser 0.91 0.44 - 1.00 mg/dL   Calcium 8.3 (L) 8.9 - 10.3 mg/dL   Total Protein 7.5  6.5 - 8.1 g/dL   Albumin 4.1 3.5 - 5.0 g/dL   AST 28 15 - 41 U/L   ALT 16 0 - 44 U/L   Alkaline Phosphatase 120 38 - 126 U/L   Total Bilirubin 0.5 0.3 - 1.2 mg/dL   GFR, Estimated >60 >60 mL/min    Comment: (NOTE) Calculated using the CKD-EPI Creatinine Equation (2021)    Anion gap 13 5 - 15    Comment: Performed at [facility], [place] [address], [place], [place] [address]  CBC with Differential     Status: Abnormal   Collection Time: 10/18/20  3:56 AM  Result Value Ref Range   WBC 18.4 (H) 4.0 - 10.5 K/uL   RBC 5.06 3.87 - 5.11 MIL/uL   Hemoglobin 16.0 (H) 12.0 - 15.0 g/dL   HCT 46.6 (H) 36.0 - 46.0 %   MCV 92.1 80.0 - 100.0 fL   MCH 31.6 26.0 - 34.0 pg   MCHC 34.3 30.0 - 36.0 g/dL   RDW 12.8 11.5 - 15.5 %   Platelets 255 150 - 400 K/uL   nRBC 0.0 0.0 - 0.2 %   Neutrophils Relative % 71 %   Neutro Abs 13.2 (H) 1.7 - 7.7 K/uL   Lymphocytes Relative 17 %   Lymphs Abs 3.2 0.7 - 4.0 K/uL   Monocytes Relative 9 %   Monocytes Absolute 1.6 (H) 0.1 - 1.0 K/uL   Eosinophils Relative 2 %   Eosinophils Absolute 0.3 0.0 - 0.5 K/uL   Basophils Relative 0 %   Basophils Absolute 0.1 0.0 - 0.1 K/uL   Immature Granulocytes 1 %    Abs Immature Granulocytes 0.10 (H) 0.00 - 0.07 K/uL    Comment: Performed at [facility], [place] [address] [place], [place] [address]  Lipase, blood     Status: None   Collection Time: 10/18/20  3:56 AM  Result Value Ref Range   Lipase 33 11 - 51 U/L    Comment: Performed at [facility], [place] [address] [place], [place] [address]  Troponin I (High Sensitivity)     Status: None   Collection Time: 10/18/20  3:56 AM  Result Value Ref Range   Troponin I (High Sensitivity) 4 <18 ng/L    Comment: (NOTE) Elevated high sensitivity troponin I (hsTnI) values and significant  changes across serial measurements may suggest ACS but many other  chronic and acute conditions are known to elevate hsTnI results.  Refer to the "Links" section for chest pain algorithms and additional  guidance. Performed at [facility], [place] [address], [place], [place] [address]   Troponin I (High Sensitivity)     Status: None   Collection Time: 10/18/20  8:32 AM  Result Value Ref Range   Troponin I (High Sensitivity) 4 <18 ng/L    Comment: (NOTE) Elevated high sensitivity troponin I (hsTnI) values and significant  changes across serial measurements may suggest ACS but many other  chronic and acute conditions are known to elevate hsTnI results.  Refer to the "Links" section for chest pain algorithms and additional  guidance. Performed at [facility], [place] [address], [place], [place] [address]   Resp Panel by RT-PCR (Flu A&B, Covid) Nasopharyngeal Swab     Status: None   Collection Time: 10/18/20  8:32 AM   Specimen: Nasopharyngeal Swab; Nasopharyngeal(NP) swabs in vial transport medium  Result Value Ref Range   SARS Coronavirus 2 by RT PCR NEGATIVE  NEGATIVE    Comment: (NOTE) SARS-CoV-2 target nucleic acids are NOT DETECTED.  The SARS-CoV-2 RNA is generally detectable in upper respiratory specimens during the acute phase of  infection. The lowest concentration of SARS-CoV-2 viral copies this assay can detect is 138 copies/mL. A negative result does not preclude SARS-Cov-2 infection and should not be used as the sole basis for treatment or other patient management decisions. A negative result may occur with  improper specimen collection/handling, submission of specimen other than nasopharyngeal swab, presence of viral mutation(s) within the areas targeted by this assay, and inadequate number of viral copies(<138 copies/mL). A negative result must be combined with clinical observations, patient history, and epidemiological information. The expected result is Negative.  Fact Sheet for Patients:  EntrepreneurPulse.com.au  Fact Sheet for Healthcare Providers:  IncredibleEmployment.be  This test is no t yet approved or cleared by the [place] FDA and  has been authorized for detection and/or diagnosis of SARS-CoV-2 by FDA under an Emergency Use Authorization (EUA). This EUA will remain  in effect (meaning this test can be used) for the duration of the COVID-19 declaration under Section 564(b)(1) of the Act, 21 U.S.C.section 360bbb-3(b)(1), unless the authorization is terminated  or revoked sooner.       Influenza A by PCR NEGATIVE NEGATIVE   Influenza B by PCR NEGATIVE NEGATIVE    Comment: (NOTE) The Xpert Xpress SARS-CoV-2/FLU/RSV plus assay is intended as an aid in the diagnosis of influenza from Nasopharyngeal swab specimens and should not be used as a sole basis for treatment. Nasal washings and aspirates are unacceptable for Xpert Xpress SARS-CoV-2/FLU/RSV testing.  Fact Sheet for Patients: EntrepreneurPulse.com.au  Fact Sheet for Healthcare Providers: IncredibleEmployment.be  This test is not yet approved or cleared by the [place] FDA and has been authorized for detection and/or diagnosis of SARS-CoV-2 by FDA  under an Emergency Use Authorization (EUA). This EUA will remain in effect (meaning this test can be used) for the duration of the COVID-19 declaration under Section 564(b)(1) of the Act, 21 U.S.C. section 360bbb-3(b)(1), unless the authorization is terminated or revoked.  Performed at [facility], [place] [address], [place], [place] [address]   Lactic acid, plasma     Status: Abnormal   Collection Time: 10/18/20 10:15 AM  Result Value Ref Range   Lactic Acid, Venous 2.8 (HH) 0.5 - 1.9 mmol/L    Comment: CRITICAL RESULT CALLED TO, READ BACK BY AND VERIFIED WITH: T,HOLT AT 1050 ON 10/19/19 BY A,MOHAMED Performed at [facility], [place] [address], [place], [place] [address]   Urinalysis, Routine w reflex microscopic Urine, Clean Catch     Status: Abnormal   Collection Time: 10/18/20 10:57 AM  Result Value Ref Range   Color, Urine YELLOW YELLOW   APPearance CLEAR CLEAR   Specific Gravity, Urine 1.045 (H) 1.005 - 1.030   pH 5.0 5.0 - 8.0   Glucose, UA NEGATIVE NEGATIVE mg/dL   Hgb urine dipstick NEGATIVE NEGATIVE   Bilirubin Urine NEGATIVE NEGATIVE   Ketones, ur NEGATIVE NEGATIVE mg/dL   Protein, ur NEGATIVE NEGATIVE mg/dL   Nitrite NEGATIVE NEGATIVE   Leukocytes,Ua NEGATIVE NEGATIVE    Comment: Performed at [place] [address], [place], [place] [address]  HIV Antibody (routine testing w rflx)     Status: None   Collection Time: 10/18/20  1:24 PM  Result Value Ref Range   HIV Screen 4th Generation wRfx Non Reactive Non Reactive    Comment: Performed  at [facility], [place] [address], [place], [place] [address]  Lactic acid, plasma     Status: Abnormal   Collection Time: 10/18/20  1:26 PM  Result Value Ref Range   Lactic Acid, Venous 2.6 (HH) 0.5 - 1.9 mmol/L    Comment: CRITICAL VALUE NOTED.  VALUE IS CONSISTENT WITH PREVIOUSLY REPORTED AND CALLED VALUE. Performed at [facility], [place] [address], [place], [place] [address]   C Difficile Quick Screen w PCR reflex     Status: Abnormal   Collection Time: 10/18/20  1:44 PM   Specimen: STOOL  Result Value Ref Range   C Diff antigen POSITIVE (A) NEGATIVE   C Diff toxin NEGATIVE NEGATIVE   C Diff interpretation Results are indeterminate. See PCR results.     Comment: Performed at [facility], [place] [address] [place], [place] [address]  C. Diff by PCR, Reflexed     Status: Abnormal   Collection Time: 10/18/20  1:44 PM  Result Value Ref Range   Toxigenic C. Difficile by PCR POSITIVE (A) NEGATIVE    Comment: Positive for toxigenic C. difficile with little to no toxin production. Only treat if clinical presentation suggests symptomatic illness. Performed at [facility], [place] [address] [place] [address], [place], [place] [address]   Basic metabolic panel     Status: Abnormal   Collection Time: 10/19/20  5:40 AM  Result Value Ref Range   Sodium 132 (L) 135 - 145 mmol/L   Potassium 3.3 (L) 3.5 - 5.1 mmol/L   Chloride 100 98 - 111 mmol/L   CO2 22 22 - 32 mmol/L   Glucose, Bld 102 (H) 70 - 99 mg/dL    Comment: Glucose reference range applies only to samples taken after fasting for at least 8 hours.   BUN 7 6 - 20 mg/dL   Creatinine, Ser 0.61 0.44 - 1.00 mg/dL   Calcium 7.8 (L) 8.9 - 10.3 mg/dL   GFR, Estimated >60 >60 mL/min    Comment: (NOTE) Calculated using the CKD-EPI Creatinine Equation (2021)    Anion gap 10 5 - 15    Comment: Performed at [facility], [place] [address] [place], [place] [address]  CBC     Status: Abnormal   Collection Time: 10/19/20  5:40 AM  Result Value Ref Range   WBC 7.6 4.0 - 10.5 K/uL   RBC 4.12 3.87 - 5.11 MIL/uL   Hemoglobin 12.8 12.0 - 15.0 g/dL   HCT 38.7 36.0 - 46.0 %   MCV 93.9 80.0 - 100.0 fL   MCH 31.1 26.0 - 34.0 pg   MCHC 33.1 30.0 - 36.0 g/dL   RDW 12.4 11.5 - 15.5 %   Platelets 148 (L) 150 - 400 K/uL   nRBC 0.0 0.0 - 0.2 %    Comment: Performed at [facility], [place] [address] [place], [place] [address]  Strep pneumoniae urinary antigen     Status: None   Collection Time: 10/19/20 10:43 AM  Result Value Ref Range   Strep Pneumo Urinary Antigen NEGATIVE NEGATIVE    Comment:        Infection due to S. pneumoniae cannot be absolutely ruled out since the antigen present may be below the detection limit of the test. Performed at [facility], [address] [place], [place] [address]     MICRO: Reviewed. cdiff + IMAGING: DG Chest 2 View  Result Date: 10/18/2020 CLINICAL DATA:  Chest pain. EXAM: CHEST - 2 VIEW COMPARISON:  07/28/2020.  04/07/2019.  CT 04/08/2019. FINDINGS: Mediastinum and hilar structures normal. Heart size normal. Mild left base and right mid lung subsegmental atelectasis/infiltrate. No pleural effusion or pneumothorax. IMPRESSION: Mild left base and right mid lung subsegmental atelectasis/infiltrate. Exam otherwise unremarkable. Electronically Signed   By: Marcello Moores  Register   On: 10/18/2020 05:33   CT Abdomen Pelvis W Contrast  Result Date: 10/18/2020 CLINICAL DATA:  Acute, nonlocalized abdominal pain EXAM: CT ABDOMEN AND PELVIS WITH CONTRAST TECHNIQUE: Multidetector CT imaging of the abdomen and pelvis was performed using the standard protocol following bolus administration of intravenous contrast. CONTRAST:  122mL OMNIPAQUE IOHEXOL 300 MG/ML  SOLN COMPARISON:  12/06/2015 FINDINGS: Lower chest:  No contributory findings. Hepatobiliary: Hepatic steatosis.Cholecystectomy. Pancreas: Unremarkable. Spleen: Unremarkable. Adrenals/Urinary Tract: Negative adrenals. No hydronephrosis or stone. Unremarkable bladder. Stomach/Bowel: Mild peritoneal thickening and slight fat stranding around 1 of multiple sigmoid diverticula. No bowel obstruction. No appendicitis. Vascular/Lymphatic: No acute vascular abnormality. Atheromatous calcification of the aorta. No mass or adenopathy. Reproductive:Hysterectomy. Other: No ascites or  pneumoperitoneum. Musculoskeletal: No acute abnormalities. Lower lumbar disc degeneration and epidural lipomatosis. IMPRESSION: 1. Possible early sigmoid diverticulitis. 2. Hepatic steatosis. 3. Aortic atherosclerosis. Electronically Signed   By: Monte Fantasia M.D.   On: 10/18/2020 05:43   Assessment/Plan:  55yo F with hx of diverticulitis, and COPD admitted for GI symptoms of N/V/diarrhea found to have early diverticulitis on CT but stool testing + for cdifficile in addition. She has patchy infiltrate in the setting of cough.  - since she is has PCN anaphylaxis, unable to do amp/sub to cover diverticulitis plus pneumonia. For now continue on ceftriaxone plus iv metronidazole. Continue on azithromycin until urine legionella assay returns - cdifficile diarrhea = had recent abtx exposure, would continue with oral vanco 125mg  QID.  Will continue to follow to help streamline abtx  Garet Hooton B. [facility] [phone]

## 2020-10-20 LAB — CBC WITH DIFFERENTIAL/PLATELET
Abs Immature Granulocytes: 0.02 10*3/uL (ref 0.00–0.07)
Basophils Absolute: 0 10*3/uL (ref 0.0–0.1)
Basophils Relative: 0 %
Eosinophils Absolute: 0.5 10*3/uL (ref 0.0–0.5)
Eosinophils Relative: 7 %
HCT: 36.8 % (ref 36.0–46.0)
Hemoglobin: 12.5 g/dL (ref 12.0–15.0)
Immature Granulocytes: 0 %
Lymphocytes Relative: 16 %
Lymphs Abs: 1.3 10*3/uL (ref 0.7–4.0)
MCH: 31.8 pg (ref 26.0–34.0)
MCHC: 34 g/dL (ref 30.0–36.0)
MCV: 93.6 fL (ref 80.0–100.0)
Monocytes Absolute: 0.7 10*3/uL (ref 0.1–1.0)
Monocytes Relative: 9 %
Neutro Abs: 5.1 10*3/uL (ref 1.7–7.7)
Neutrophils Relative %: 68 %
Platelets: 148 10*3/uL — ABNORMAL LOW (ref 150–400)
RBC: 3.93 MIL/uL (ref 3.87–5.11)
RDW: 12.2 % (ref 11.5–15.5)
WBC: 7.6 10*3/uL (ref 4.0–10.5)
nRBC: 0 % (ref 0.0–0.2)

## 2020-10-20 LAB — BASIC METABOLIC PANEL
Anion gap: 8 (ref 5–15)
BUN: <5 mg/dL — ABNORMAL LOW (ref 6–20)
CO2: 27 mmol/L (ref 22–32)
Calcium: 8.3 mg/dL — ABNORMAL LOW (ref 8.9–10.3)
Chloride: 99 mmol/L (ref 98–111)
Creatinine, Ser: 0.65 mg/dL (ref 0.44–1.00)
GFR, Estimated: >60 mL/min (ref >60)
Glucose, Bld: 108 mg/dL — ABNORMAL HIGH (ref 70–99)
Potassium: 3.8 mmol/L (ref 3.5–5.1)
Sodium: 134 mmol/L — ABNORMAL LOW (ref 135–145)

## 2020-10-20 LAB — LEGIONELLA PNEUMOPHILA SEROGP 1 UR AG: L. pneumophila Serogp 1 Ur Ag: NEGATIVE

## 2020-10-20 LAB — LACTIC ACID, PLASMA: Lactic Acid, Venous: 0.8 mmol/L (ref 0.5–1.9)

## 2020-10-20 LAB — MAGNESIUM: Magnesium: 1.3 mg/dL — ABNORMAL LOW (ref 1.7–2.4)

## 2020-10-20 MED ORDER — MAGNESIUM SULFATE 2 GM/50ML IV SOLN
2.0000 g | Freq: Once | INTRAVENOUS | Status: AC
  Administered 2020-10-20: 2 g via INTRAVENOUS
  Filled 2020-10-20: qty 50

## 2020-10-20 MED ORDER — ALBUTEROL SULFATE (2.5 MG/3ML) 0.083% IN NEBU
2.5000 mg | INHALATION_SOLUTION | Freq: Two times a day (BID) | RESPIRATORY_TRACT | Status: DC
  Administered 2020-10-20 – 2020-10-21 (×2): 2.5 mg via RESPIRATORY_TRACT
  Filled 2020-10-20 (×2): qty 3

## 2020-10-20 MED ORDER — ALBUTEROL SULFATE HFA 108 (90 BASE) MCG/ACT IN AERS: 2.0000 | INHALATION_SPRAY | Freq: Two times a day (BID) | RESPIRATORY_TRACT | Status: DC

## 2020-10-20 MED ORDER — ALBUTEROL SULFATE (2.5 MG/3ML) 0.083% IN NEBU: 2.5000 mg | INHALATION_SOLUTION | Freq: Two times a day (BID) | RESPIRATORY_TRACT | Status: DC

## 2020-10-20 NOTE — Plan of Care (Signed)

## 2020-10-20 NOTE — Progress Notes (Signed)
PROGRESS NOTE    Vicki [name]  QMG:[id number] DOB: 1965/06/23 DOA: 10/18/2020 PCP: Maximiano Coss, NP    Chief Complaint  Patient presents with   Abdominal Pain    Brief Narrative:   History of chronic urticaria, asthma, COPD, chronic hypoxic respiratory failure on home oxygen 3 L, prior history of multiple abdominal surgeries and recent right ACL reconstruction, presented to the ED with abdominal pain, nausea and vomiting, also reports chest is tight throat is burning had a cough, also record syncope while taking a shower, States that this occurs occasionally while she is standing up and one time occurred while she was mowing the lawn  Subjective:  Diarrhea has slowed down,  left lower ab pain is intermittent, less severe, currently no pain No vomiting , last dry heaved early this am when she felt dizzy Currently no dizziness Still having cough, non production, no hypoxia She wants to eat Husband at bedside   Assessment & Plan:   Principal Problem:   Diverticulitis Active Problems:   COPD (chronic obstructive pulmonary disease) ([place])   Depression   CAP (community acquired pneumonia)   Essential hypertension, benign   Generalized anxiety disorder   Nausea and vomiting   Chronic urticaria   C. difficile colitis   Sepsis presents on admission with sinus tachycardia, tachypnea, leukocytosis, lactic acidosis, source of infection including C. difficile colitis, sigmoid diverticulitis, pneumonia -urine Legionella and urine strep pneumo antigen, both negative -? aspiration pneumonia since recurrent vomiting at home, cough is new -Was on Rocephin Flagyl and oral Vanco --Case discussed with infectious disease Dr. Baxter Flattery, currently on oral vanc   N/v/d CT ab on presentation no obstruction does has signs off colitis on sigmoid colon Clinically improved, advance diet as tolerated DC IV fluids  Hyponatremia -Improved with IV hydration, encourage oral  intake  Hypokalemia/hypomagnesemia Replace K, check mag  Syncope + tinnitus Start antivert Received hydration Reports recurrent issues for the last 6 months, will send cards note for outpatient cardiac monitoring as well Also advised patient to follow-up with ENT outpatient for vertigo  COPD /chronic hypoxic respiratory failure on home O2 3 L  Asthma continue Singulair  Hypertension continue Norvasc, she is also on prazosin  Psych, stable On present, trazodone, hydroxyzine, Celexa  History of alcohol use Report has cut down significantly, only drink 2 drinks per month  Nutritional Assessment: The patient's BMI is: Body mass index is 30.11 kg/m.Marland Kitchen      Unresulted Labs (From admission, onward)     Start     Ordered   10/19/20 3267  Basic metabolic panel  Daily,   R      10/18/20 0843              DVT prophylaxis: enoxaparin (LOVENOX) injection 40 mg Start: 10/18/20 1000   Code Status: Full Family Communication: Husband at bedside Disposition:   Status is: Inpatient  Dispo: The patient is from: Home              Anticipated d/c is to: Home              Anticipated d/c date is: 24 to 48 hours                 Consultants:  ID  Procedures:  None  Antimicrobials:   Anti-infectives (From admission, onward)    Start     Dose/Rate Route Frequency Ordered Stop   10/19/20 2000  metroNIDAZOLE (FLAGYL) IVPB 500 mg  Status:  Discontinued  500 mg 100 mL/hr over 60 Minutes Intravenous Every 8 hours 10/19/20 1853 10/20/20 1517   10/19/20 2000  cefTRIAXone (ROCEPHIN) 2 g in sodium chloride 0.9 % 100 mL IVPB  Status:  Discontinued        2 g 200 mL/hr over 30 Minutes Intravenous Every 24 hours 10/19/20 1854 10/20/20 1410   10/19/20 1000  vancomycin (VANCOCIN) capsule 125 mg        125 mg Oral 4 times daily 10/19/20 0826 10/29/20 0959   10/19/20 0930  metroNIDAZOLE (FLAGYL) IVPB 500 mg  Status:  Discontinued        500 mg 100 mL/hr over 60 Minutes  Intravenous Every 8 hours 10/19/20 0826 10/19/20 1852   10/19/20 0800  cefTRIAXone (ROCEPHIN) 2 g in sodium chloride 0.9 % 100 mL IVPB  Status:  Discontinued        2 g 200 mL/hr over 30 Minutes Intravenous Every 24 hours 10/18/20 0955 10/19/20 1852   10/18/20 1300  azithromycin (ZITHROMAX) tablet 500 mg  Status:  Discontinued        500 mg Oral Daily 10/18/20 1217 10/20/20 1410   10/18/20 1000  azithromycin (ZITHROMAX) tablet 500 mg  Status:  Discontinued        500 mg Oral Daily 10/18/20 0953 10/18/20 1217   10/18/20 0645  cefTRIAXone (ROCEPHIN) 2 g in sodium chloride 0.9 % 100 mL IVPB       See Hyperspace for full Linked Orders Report.   2 g 200 mL/hr over 30 Minutes Intravenous  Once 10/18/20 0641 10/18/20 0825   10/18/20 0645  metroNIDAZOLE (FLAGYL) IVPB 500 mg       See Hyperspace for full Linked Orders Report.   500 mg 100 mL/hr over 60 Minutes Intravenous  Once 10/18/20 0641 10/18/20 0952           Objective: Vitals:   10/20/20 1304 10/20/20 1326 10/20/20 1327 10/20/20 1523  BP: (!) 139/98 (!) 134/94  (!) 145/93  Pulse: (!) 130 83  97  Resp:  20  20  Temp:  98.8 F (37.1 C)    TempSrc:  Oral    SpO2: 90% 97% 95% 90%  Weight:      Height:        Intake/Output Summary (Last 24 hours) at 10/20/2020 1745 Last data filed at 10/20/2020 1500 Gross per 24 hour  Intake 1626.38 ml  Output --  Net 1626.38 ml   Filed Weights   10/18/20 0836  Weight: 77.1 kg    Examination:  General exam: calm, NAD Respiratory system: Clear to auscultation. Respiratory effort normal. Cardiovascular system: S1 & S2 heard, RRR. No JVD, no murmur, No pedal edema. Gastrointestinal system: Abdomen is nondistended, soft and nontender. Normal bowel sounds heard. Central nervous system: Alert and oriented. No focal neurological deficits. Extremities: Symmetric 5 x 5 power. Skin: No rashes, lesions or ulcers Psychiatry: Judgement and insight appear normal. Mood & affect appropriate.      Data Reviewed: I have personally reviewed following labs and imaging studies  CBC: Recent Labs  Lab 10/18/20 0356 10/19/20 0540 10/20/20 0531  WBC 18.4* 7.6 7.6  NEUTROABS 13.2*  --  5.1  HGB 16.0* 12.8 12.5  HCT 46.6* 38.7 36.8  MCV 92.1 93.9 93.6  PLT 255 148* 148*    Basic Metabolic Panel: Recent Labs  Lab 10/18/20 0356 10/19/20 0540 10/20/20 0531  NA 134* 132* 134*  K 3.6 3.3* 3.8  CL 98 100 99  CO2 23 22 27  GLUCOSE 120* 102* 108*  BUN 18 7 <5*  CREATININE 0.91 0.61 0.65  CALCIUM 8.3* 7.8* 8.3*  MG  --   --  1.3*    GFR: Estimated Creatinine Clearance: 80 mL/min (by C-G formula based on SCr of 0.65 mg/dL).  Liver Function Tests: Recent Labs  Lab 10/18/20 0356  AST 28  ALT 16  ALKPHOS 120  BILITOT 0.5  PROT 7.5  ALBUMIN 4.1    CBG: No results for input(s): GLUCAP in the last 168 hours.   Recent Results (from the past 240 hour(s))  Resp Panel by RT-PCR (Flu A&B, Covid) Nasopharyngeal Swab     Status: None   Collection Time: 10/18/20  8:32 AM   Specimen: Nasopharyngeal Swab; Nasopharyngeal(NP) swabs in vial transport medium  Result Value Ref Range Status   SARS Coronavirus 2 by RT PCR NEGATIVE NEGATIVE Final    Comment: (NOTE) SARS-CoV-2 target nucleic acids are NOT DETECTED.  The SARS-CoV-2 RNA is generally detectable in upper respiratory specimens during the acute phase of infection. The lowest concentration of SARS-CoV-2 viral copies this assay can detect is 138 copies/mL. A negative result does not preclude SARS-Cov-2 infection and should not be used as the sole basis for treatment or other patient management decisions. A negative result may occur with  improper specimen collection/handling, submission of specimen other than nasopharyngeal swab, presence of viral mutation(s) within the areas targeted by this assay, and inadequate number of viral copies(<138 copies/mL). A negative result must be combined with clinical observations,  patient history, and epidemiological information. The expected result is Negative.  Fact Sheet for Patients:  EntrepreneurPulse.com.au  Fact Sheet for Healthcare Providers:  IncredibleEmployment.be  This test is no t yet approved or cleared by the [place] FDA and  has been authorized for detection and/or diagnosis of SARS-CoV-2 by FDA under an Emergency Use Authorization (EUA). This EUA will remain  in effect (meaning this test can be used) for the duration of the COVID-19 declaration under Section 564(b)(1) of the Act, 21 U.S.C.section 360bbb-3(b)(1), unless the authorization is terminated  or revoked sooner.       Influenza A by PCR NEGATIVE NEGATIVE Final   Influenza B by PCR NEGATIVE NEGATIVE Final    Comment: (NOTE) The Xpert Xpress SARS-CoV-2/FLU/RSV plus assay is intended as an aid in the diagnosis of influenza from Nasopharyngeal swab specimens and should not be used as a sole basis for treatment. Nasal washings and aspirates are unacceptable for Xpert Xpress SARS-CoV-2/FLU/RSV testing.  Fact Sheet for Patients: EntrepreneurPulse.com.au  Fact Sheet for Healthcare Providers: IncredibleEmployment.be  This test is not yet approved or cleared by the [place] FDA and has been authorized for detection and/or diagnosis of SARS-CoV-2 by FDA under an Emergency Use Authorization (EUA). This EUA will remain in effect (meaning this test can be used) for the duration of the COVID-19 declaration under Section 564(b)(1) of the Act, 21 U.S.C. section 360bbb-3(b)(1), unless the authorization is terminated or revoked.  Performed at [facility], [place] [address] [place], [place] [address]   C Difficile Quick Screen w PCR reflex     Status: Abnormal   Collection Time: 10/18/20  1:44 PM   Specimen: STOOL  Result Value Ref Range Status   C Diff antigen POSITIVE (A) NEGATIVE Final    C Diff toxin NEGATIVE NEGATIVE Final   C Diff interpretation Results are indeterminate. See PCR results.  Final    Comment: Performed at [facility], [place] [address],  [place], [place] [address]  C. Diff by PCR, Reflexed     Status: Abnormal   Collection Time: 10/18/20  1:44 PM  Result Value Ref Range Status   Toxigenic C. Difficile by PCR POSITIVE (A) NEGATIVE Final    Comment: Positive for toxigenic C. difficile with little to no toxin production. Only treat if clinical presentation suggests symptomatic illness. Performed at [facility], [place] [address], [place], [place] [address]          Radiology Studies: DG Abd 1 View  Result Date: 10/19/2020 CLINICAL DATA:  Nausea and vomiting. EXAM: ABDOMEN - 1 VIEW COMPARISON:  None. FINDINGS: The bowel gas pattern is normal. No radio-opaque calculi or other significant radiographic abnormality are seen. Radiopaque surgical clips are seen within the right upper quadrant. IMPRESSION: Negative. Electronically Signed   By: Virgina Norfolk M.D.   On: 10/19/2020 18:51         Scheduled Meds:  albuterol  2.5 mg Nebulization BID   amLODipine  5 mg Oral Daily   aspirin EC  81 mg Oral Daily   citalopram  20 mg Oral Daily   enoxaparin (LOVENOX) injection  40 mg Subcutaneous Q24H   famotidine  20 mg Oral BID   fluticasone furoate-vilanterol  1 puff Inhalation Daily   gabapentin  300 mg Oral BID   loratadine  10 mg Oral Daily   prazosin  1 mg Oral QHS   rOPINIRole  1 mg Oral QHS   sodium chloride flush  3 mL Intravenous Q12H   traZODone  50 mg Oral QHS   umeclidinium bromide  1 puff Inhalation Daily   vancomycin  125 mg Oral QID   Continuous Infusions:  dextrose 5 % and 0.9% NaCl 75 mL/hr at 10/19/20 1908   magnesium sulfate bolus IVPB     promethazine (PHENERGAN) injection (IM or IVPB) Stopped (10/20/20 1615)     LOS: 1 day   Time spent: 33mins Greater than 50% of this time was spent in counseling, explanation of  diagnosis, planning of further management, and coordination of care.   Voice Recognition Viviann Spare dictation system was used to create this note, attempts have been made to correct errors. Please contact the author with questions and/or clarifications.   Florencia Reasons, MD PhD FACP [facility]  Available via Epic secure chat 7am-7pm for nonurgent issues Please page for urgent issues To page the attending provider between 7A-7P or the covering provider during after hours 7P-7A, please log into the web site [email or web address] and access using universal [place] password for that web site. If you do not have the password, please call the hospital operator.    10/20/2020, 5:45 PM

## 2020-10-20 NOTE — Evaluation (Signed)
Physical Therapy Evaluation Patient Details Name: Vicki [name] MRN: [id number] DOB: 03/23/1966 Today's Date: 10/20/2020   History of Present Illness  55yo F  admitted on 10/19/20 for GI symptoms of N/V/diarrhea found to have early diverticulitis on CT but stool testing + for cdifficile in addition. She has patchy infiltrate in the setting of cough.  Pt with hx of diverticulitis, and COPD. She had a recent R ACL repair.  Clinical Impression  Pt admitted with above diagnosis. Pt ambulated 15'x 2 with RW, distance limited due to pt feel dizzy and overheated. BP 139/98 supine, HR 130 walking, SaO2 90% on room air with walking. Good progress expected as medical issues resolve. Pt currently with functional limitations due to the deficits listed below (see PT Problem List). Pt will benefit from skilled PT to increase their independence and safety with mobility to allow discharge to the venue listed below.       Follow Up Recommendations Outpatient PT (resume outpt PT for R ACL repair)    Equipment Recommendations  Rolling walker with 5" wheels    Recommendations for Other Services       Precautions / Restrictions Precautions Precautions: Fall Precaution Comments: 2-3 falls in the past year, most recently 2 weeks ago Restrictions Weight Bearing Restrictions: No      Mobility  Bed Mobility Overal bed mobility: Modified Independent             General bed mobility comments: HOB up, used rail    Transfers Overall transfer level: Needs assistance Equipment used: Rolling walker (2 wheeled) Transfers: Sit to/from Stand Sit to Stand: Min guard         General transfer comment: VCs hand placement, min/guard for safety  Ambulation/Gait Ambulation/Gait assistance: Min guard Gait Distance (Feet): 28 Feet Assistive device: Rolling walker (2 wheeled) Gait Pattern/deviations: Step-through pattern;Decreased stride length Gait velocity: decr   General Gait Details: 14' x 2 (bed  to bathroom to bed) with RW, pt stated she felt overheated while in bathroom, did not feel she could ambulate further. SaO2 90% on room air, HR 130, BP 139/98  Stairs            Wheelchair Mobility    Modified Rankin (Stroke Patients Only)       Balance Overall balance assessment: Needs assistance;History of Falls   Sitting balance-Leahy Scale: Good       Standing balance-Leahy Scale: Fair                               Pertinent Vitals/Pain Pain Assessment: No/denies pain    Home Living Family/patient expects to be discharged to:: Private residence Living Arrangements: Spouse/significant other Available Help at Discharge: Family Type of Home: House Home Access: Stairs to enter   Technical brewer of Steps: 1 Home Layout: One level Home Equipment: Cane - single point      Prior Function Level of Independence: Independent with assistive device(s)         Comments: walks with SPC 2* recent R ACL repair     Hand Dominance        Extremity/Trunk Assessment   Upper Extremity Assessment Upper Extremity Assessment: Overall WFL for tasks assessed    Lower Extremity Assessment Lower Extremity Assessment: Overall WFL for tasks assessed;RLE deficits/detail RLE Deficits / Details: recent R ACL repair, flexion AROM ~120*, SLR at least 3/5 RLE Sensation: WNL RLE Coordination: WNL    Cervical / Trunk Assessment  Cervical / Trunk Assessment: Normal  Communication   Communication: No difficulties  Cognition Arousal/Alertness: Awake/alert Behavior During Therapy: WFL for tasks assessed/performed Overall Cognitive Status: Within Functional Limits for tasks assessed                                        General Comments      Exercises     Assessment/Plan    PT Assessment Patient needs continued PT services  PT Problem List Decreased mobility;Decreased activity tolerance;Cardiopulmonary status limiting activity        PT Treatment Interventions Therapeutic activities;Therapeutic exercise;Gait training;Functional mobility training    PT Goals (Current goals can be found in the Care Plan section)  Acute Rehab PT Goals Patient Stated Goal: play with grandkids PT Goal Formulation: With patient/family Time For Goal Achievement: 11/03/20 Potential to Achieve Goals: Good    Frequency Min 3X/week   Barriers to discharge        Co-evaluation               AM-PAC PT "6 Clicks" Mobility  Outcome Measure Help needed turning from your back to your side while in a flat bed without using bedrails?: A Little Help needed moving from lying on your back to sitting on the side of a flat bed without using bedrails?: A Little Help needed moving to and from a bed to a chair (including a wheelchair)?: A Little Help needed standing up from a chair using your arms (e.g., wheelchair or bedside chair)?: A Little Help needed to walk in hospital room?: A Little Help needed climbing 3-5 steps with a railing? : A Little 6 Click Score: 18    End of Session Equipment Utilized During Treatment: Gait belt Activity Tolerance: Treatment limited secondary to medical complications (Comment) (HR 130 with walking, SaO2 90% on room air walking, pt felt overheated) Patient left: in bed;with call bell/phone within reach;with family/visitor present;with bed alarm set Nurse Communication: Mobility status PT Visit Diagnosis: Difficulty in walking, not elsewhere classified (R26.2)    Time: 1250-1309 PT Time Calculation (min) (ACUTE ONLY): 19 min   Charges:   PT Evaluation $PT Eval Moderate Complexity: 1 Mod         Philomena Doheny PT 10/20/2020  Acute Rehabilitation Services Pager [phone] Office [phone]

## 2020-10-20 NOTE — Progress Notes (Addendum)
[place] for Infectious Disease    Date of Admission:  10/18/2020   Total days of antibiotics 4          ID: Vicki [name] is a 55 y.o. female with   Principal Problem:   Diverticulitis Active Problems:   COPD (chronic obstructive pulmonary disease) ([place])   Depression   CAP (community acquired pneumonia)   Essential hypertension, benign   Generalized anxiety disorder   Nausea and vomiting   Chronic urticaria   C. difficile colitis    Subjective: Remains afebrile, significantly less diarrhea. Cough is at her baseline, she is reviewing her photos of spontaneous urticaria of unknown etiology with me this morning. ROS: no rash, no nausea or vomiting this morning  Medications:   albuterol  2.5 mg Nebulization BID   amLODipine  5 mg Oral Daily   aspirin EC  81 mg Oral Daily   citalopram  20 mg Oral Daily   enoxaparin (LOVENOX) injection  40 mg Subcutaneous Q24H   famotidine  20 mg Oral BID   fluticasone furoate-vilanterol  1 puff Inhalation Daily   gabapentin  300 mg Oral BID   loratadine  10 mg Oral Daily   prazosin  1 mg Oral QHS   rOPINIRole  1 mg Oral QHS   sodium chloride flush  3 mL Intravenous Q12H   traZODone  50 mg Oral QHS   umeclidinium bromide  1 puff Inhalation Daily   vancomycin  125 mg Oral QID    Objective: Vital signs in last 24 hours: Temp:  [98.3 F (36.8 C)-99 F (37.2 C)] 98.8 F (37.1 C) (06/09 1326) Pulse Rate:  [83-130] 83 (06/09 1326) Resp:  [18-20] 20 (06/09 1326) BP: (110-139)/(72-98) 134/94 (06/09 1326) SpO2:  [90 %-98 %] 95 % (06/09 1327)  Physical Exam  Constitutional:  oriented to person, place, and time. appears well-developed and well-nourished. No distress.  HENT: [place]/AT, PERRLA, no scleral icterus Mouth/Throat: Oropharynx is clear and moist. No oropharyngeal exudate.  Cardiovascular: Normal rate, regular rhythm and normal heart sounds. Exam reveals no gallop and no friction rub.  No murmur heard.   Pulmonary/Chest: Effort normal and breath sounds normal. No respiratory distress.  Mild rhonchi clears with cough Neck = supple, no nuchal rigidity Abdominal: Soft. Bowel sounds are soft. exhibits no distension. There is no tenderness.  Lymphadenopathy: no cervical adenopathy. No axillary adenopathy Neurological: alert and oriented to person, place, and time.  Skin: Skin is warm and dry. No rash noted. No erythema.  Psychiatric: a normal mood and affect.  behavior is normal.   Lab Results Recent Labs    10/19/20 0540 10/20/20 0531  WBC 7.6 7.6  HGB 12.8 12.5  HCT 38.7 36.8  NA 132* 134*  K 3.3* 3.8  CL 100 99  CO2 22 27  BUN 7 <5*  CREATININE 0.61 0.65   Liver Panel Recent Labs    10/18/20 0356  PROT 7.5  ALBUMIN 4.1  AST 28  ALT 16  ALKPHOS 120  BILITOT 0.5     Microbiology: Strep pneumo negative ur antigen Studies/Results: DG Abd 1 View  Result Date: 10/19/2020 CLINICAL DATA:  Nausea and vomiting. EXAM: ABDOMEN - 1 VIEW COMPARISON:  None. FINDINGS: The bowel gas pattern is normal. No radio-opaque calculi or other significant radiographic abnormality are seen. Radiopaque surgical clips are seen within the right upper quadrant. IMPRESSION: Negative. Electronically Signed   By: Virgina Norfolk M.D.   On: 10/19/2020 18:51  Assessment/Plan: C.difficile diarrhea = unclear if she had antibiotic exposure recently from ACL repair procedure. Appears much improved. She states that her initial presentation is different than what she has experienced with diverticulitis. Recommend to narrow abtx to oral vancomycin 125mg  QID plan for 10 day course of treatment  Cough = appears at her baseline. She had a few areas of infiltrate vs atelectesis. Suspect it was the latter. Will discontinue ceftriaxone plus azithromycin so that can target her treatment for c.difficile  Possible early diverticulitis on CT imaging = her presentation appears more consistent with c.difficile. will  discontinue ceftriaxone and iv metronidazole. If she notices it worsening, can restart these meds  Penicillin allergy = has upcoming appt with allergist to discuss urticaria. Recommended to also work up penicillin allergy.  Leukocytosis = improved  Hypomagnesiumia = will replete mag with 2gm IV; it may cause some diarrhea. Likely from GI loss  [facility] for Infectious Diseases Cell: [phone] Pager: [phone]  10/20/2020, 3:08 PM

## 2020-10-21 ENCOUNTER — Other Ambulatory Visit: Payer: Self-pay | Admitting: Home Health

## 2020-10-21 DIAGNOSIS — R55 Syncope and collapse: Secondary | ICD-10-CM

## 2020-10-21 LAB — BASIC METABOLIC PANEL
Anion gap: 7 (ref 5–15)
BUN: <5 mg/dL — ABNORMAL LOW (ref 6–20)
CO2: 31 mmol/L (ref 22–32)
Calcium: 8.7 mg/dL — ABNORMAL LOW (ref 8.9–10.3)
Chloride: 98 mmol/L (ref 98–111)
Creatinine, Ser: 0.63 mg/dL (ref 0.44–1.00)
GFR, Estimated: >60 mL/min (ref >60)
Glucose, Bld: 101 mg/dL — ABNORMAL HIGH (ref 70–99)
Potassium: 3.4 mmol/L — ABNORMAL LOW (ref 3.5–5.1)
Sodium: 136 mmol/L (ref 135–145)

## 2020-10-21 LAB — MAGNESIUM: Magnesium: 2 mg/dL (ref 1.7–2.4)

## 2020-10-21 MED ORDER — ASPIRIN EC 81 MG PO TBEC
81.0000 mg | DELAYED_RELEASE_TABLET | Freq: Every day | ORAL | Status: DC
Start: 2020-10-21 — End: 2021-03-21

## 2020-10-21 MED ORDER — INCRUSE ELLIPTA 62.5 MCG/INH IN AEPB: 1.0000 | INHALATION_SPRAY | Freq: Every day | RESPIRATORY_TRACT | Status: DC

## 2020-10-21 MED ORDER — POTASSIUM CHLORIDE CRYS ER 10 MEQ PO TBCR
40.0000 meq | EXTENDED_RELEASE_TABLET | Freq: Once | ORAL | Status: AC
  Administered 2020-10-21: 40 meq via ORAL
  Filled 2020-10-21: qty 4

## 2020-10-21 MED ORDER — FAMOTIDINE 20 MG PO TABS: 20.0000 mg | ORAL_TABLET | Freq: Every day | ORAL | Status: DC

## 2020-10-21 MED ORDER — VANCOMYCIN HCL 125 MG PO CAPS: 125.0000 mg | ORAL_CAPSULE | Freq: Four times a day (QID) | ORAL | 0 refills | Status: AC

## 2020-10-21 MED ORDER — MECLIZINE HCL 12.5 MG PO TABS: 12.5000 mg | ORAL_TABLET | Freq: Three times a day (TID) | ORAL | 0 refills | Status: DC | PRN

## 2020-10-21 MED ORDER — FEXOFENADINE HCL 180 MG PO TABS: 180.0000 mg | ORAL_TABLET | Freq: Every day | ORAL | Status: DC

## 2020-10-21 MED ORDER — OMEPRAZOLE 20 MG PO CPDR
20.0000 mg | DELAYED_RELEASE_CAPSULE | Freq: Two times a day (BID) | ORAL | Status: DC
Start: 2020-10-21 — End: 2020-10-27

## 2020-10-21 MED ORDER — CITALOPRAM HYDROBROMIDE 20 MG PO TABS: 60.0000 mg | ORAL_TABLET | Freq: Every day | ORAL | Status: DC

## 2020-10-21 MED ORDER — TRAZODONE HCL 50 MG PO TABS: 50.0000 mg | ORAL_TABLET | Freq: Every day | ORAL | Status: DC

## 2020-10-21 MED ORDER — CITALOPRAM HYDROBROMIDE 20 MG PO TABS: 60.0000 mg | ORAL_TABLET | Freq: Every day | ORAL | 2 refills | Status: DC

## 2020-10-21 NOTE — TOC Transition Note (Signed)
Transition of Care [facility]) - CM/SW Discharge Note   Patient Details  Name: Geeta Dworkin MRN: [id number] Date of Birth: 1965/10/05  [facility] of [facility]) CM/SW Contact:  Trish Mage, LCSW Phone Number: 10/21/2020, 12:42 PM   Clinical Narrative:   I was contacted by nurse who states patient was recommended rolling walker by PT and would like it delivered to home.  Orders seen and appreciated. [facility] will deliver to the home. TOC sign off.     Final next level of care: Home/Self Care Barriers to Discharge: No Barriers Identified   Patient Goals and CMS Choice        Discharge Placement                       Discharge Plan and Services                                     Social Determinants of Health (SDOH) Interventions     Readmission Risk Interventions No flowsheet data found.

## 2020-10-21 NOTE — Progress Notes (Signed)
30 days event monitor ordered for syncope per hospitalist request.

## 2020-10-21 NOTE — Discharge Summary (Signed)
Discharge Summary  Vicki [name] ZGY:[id number] DOB: 16-Jul-1965  PCP: Maximiano Coss, NP  Admit date: 10/18/2020 Discharge date: 10/21/2020  Time spent: 71mins  Recommendations for Outpatient Follow-up:  F/u with PCP within a week  for hospital discharge follow up, repeat cbc/bmp at follow up Follow-up with cardiology for outpatient event monitor for syncope work-up    Discharge Diagnoses:  Active Hospital Problems   Diagnosis Date Noted   Diverticulitis 10/18/2020   C. difficile colitis 10/19/2020   Chronic urticaria 10/07/2020   Nausea and vomiting    Essential hypertension, benign 07/14/2019   Generalized anxiety disorder 07/14/2019   CAP (community acquired pneumonia) 04/07/2019   Depression 11/07/2018   COPD (chronic obstructive pulmonary disease) [facility])     Resolved Hospital Problems  No resolved problems to display.    Discharge Condition: stable  Diet recommendation: heart healthy  Filed Weights   10/18/20 0836  Weight: 77.1 kg    History of present illness: (Per admitting MD Dr. Neysa Bonito) This is a 55 year old female with past medical history of asthma, COPD on 3 L/min O2, emphysema, chronic urticaria, hypertension, hyperlipidemia diverticulitis, multiple abdominal surgeries and recent right ACL reconstruction who presented to the ED with generalized constant abdominal pain since yesterday.  Associated with nausea, vomiting, non bloody diarrhea and unable to keep anything down.  Feels her chest is tight and throat is burning and has had a cough.  Denies known fever, hematemesis, bloody BMs or dysuria or any other complaints.  No recent foreign travel or antibiotic use.   Of note, patient reports that she recently syncopized while in the shower.  States that this occurs occasionally while she is standing up and one time occurred while she was mowing the lawn.  Did not occur when going from sitting to standing.  Denied any chest pain.  Has not had any work-up for  this.   ED Course: Afebrile, tachycardic, tachypneic,  hemodynamically stable, on room air. Notable Labs: Sodium 136, K3.6, BUN 18, creatinine 0.91, WBC 18.4, Hb 16.0, platelets 255. Notable Imaging: CXR with mid left base and right midlung subsegmental atelectasis/infiltrate.  CT abdomen pelvis with contrast- possible early sigmoid diverticulitis. Patient received fentanyl, Dilaudid, Protonix, Compazine, Phenergan, 2 L NS bolus, ceftriaxone and metronidazole.     Hospital Course:  Principal Problem:   Diverticulitis Active Problems:   COPD (chronic obstructive pulmonary disease) (HCC)   Depression   CAP (community acquired pneumonia)   Essential hypertension, benign   Generalized anxiety disorder   Nausea and vomiting   Chronic urticaria   C. difficile colitis   Sepsis presents on admission with sinus tachycardia, tachypnea, leukocytosis, lactic acidosis, source of infection including C. difficile colitis, ? sigmoid diverticulitis, ? aspiration pneumonia -urine Legionella and urine strep pneumo antigen, both negative, cough appear has resolved  --Was on Rocephin Flagyl and oral Vanco --Case discussed with infectious disease Dr. Baxter Flattery, currently on oral vanc    N/v/d CT ab on presentation no obstruction does has signs off colitis on sigmoid colon Clinically improved, able to tolerate diet advancement     Hyponatremia -Improved IV hydration, encourage oral intake   Hypokalemia/hypomagnesemia Replaced and normalized    Syncope + tinnitus Start antivert Received hydration Reports recurrent issues for the last 6 months, message sent to  cards note for outpatient cardiac monitoring as well Also advised patient to follow-up with ENT outpatient for vertigo   COPD /chronic hypoxic respiratory failure on home O2 3 L No wheezing   Asthma continue Singulair  Hypertension continue Norvasc, she is also on prazosin   Psych, stable On present, trazodone, hydroxyzine, Celexa    History of alcohol use Report has cut down significantly, only drink 2 drinks per month   Nutritional Assessment: The patient's BMI is: Body mass index is 30.11 kg/m.Marland Kitchen    Procedures: none  Consultations: ID  Discharge Exam: BP 113/66 (BP Location: Left Arm)   Pulse 96   Temp 97.6 F (36.4 C) (Oral)   Resp 15   Ht 5\' 3"  (1.6 m)   Wt 77.1 kg   SpO2 94%   BMI 30.11 kg/m   General: NAD Cardiovascular: RRR Respiratory: Normal respiratory effort  Discharge Instructions You were cared for by a hospitalist during your hospital stay. If you have any questions about your discharge medications or the care you received while you were in the hospital after you are discharged, you can call the unit and asked to speak with the hospitalist on call if the hospitalist that took care of you is not available. Once you are discharged, your primary care physician will handle any further medical issues. Please note that NO REFILLS for any discharge medications will be authorized once you are discharged, as it is imperative that you return to your primary care physician (or establish a relationship with a primary care physician if you do not have one) for your aftercare needs so that they can reassess your need for medications and monitor your lab values.  Discharge Instructions     Diet - low sodium heart healthy   Complete by: As directed    Increase activity slowly   Complete by: As directed       Allergies as of 10/21/2020       Reactions   Jasmine Oil Shortness Of Breath, Swelling   Levaquin [levofloxacin] Shortness Of Breath, Itching, Swelling, Rash   Penicillins Anaphylaxis, Hives   Tolerated ceftriaxone   Aleve [naproxen Sodium] Itching   Ceftin [cefuroxime Axetil] Other (See Comments)   unsure   Depo-provera [medroxyprogesterone Acetate] Other (See Comments)   Severe acne   Doxycycline Other (See Comments)   Raises blood pressure    Oxycodone Nausea And Vomiting   Tramadol  Other (See Comments)   Kept her up all night        Medication List     STOP taking these medications    celecoxib 200 MG capsule Commonly known as: CELEBREX   pantoprazole 40 MG tablet Commonly known as: PROTONIX       TAKE these medications    acetaminophen 500 MG tablet Commonly known as: TYLENOL Take 1,000 mg by mouth every 6 (six) hours as needed for moderate pain.   albuterol 108 (90 Base) MCG/ACT inhaler Commonly known as: VENTOLIN HFA Inhale 2 puffs into the lungs 2 (two) times daily.   ALPRAZolam 1 MG tablet Commonly known as: XANAX Take 1 tablet (1 mg total) by mouth 3 (three) times daily as needed for anxiety. What changed: when to take this   amLODipine 5 MG tablet Commonly known as: NORVASC Take 1 tablet (5 mg total) by mouth daily.   aspirin EC 81 MG tablet Take 1 tablet (81 mg total) by mouth daily.   BIOFREEZE EX Apply 1 application topically daily as needed (back pain).   cetirizine 10 MG tablet Commonly known as: ZYRTEC Take 1 tablet (10 mg total) by mouth daily. What changed: Another medication with the same name was removed. Continue taking this medication, and follow the directions you  see here.   citalopram 20 MG tablet Commonly known as: CELEXA Take 3 tablets (60 mg total) by mouth daily. What changed: how much to take   famotidine 20 MG tablet Commonly known as: Pepcid Take 1 tablet (20 mg total) by mouth daily.   fexofenadine 180 MG tablet Commonly known as: ALLEGRA Take 1 tablet (180 mg total) by mouth daily.   Fluticasone-Salmeterol 500-50 MCG/DOSE Aepb Commonly known as: Advair Diskus Inhale 1 puff into the lungs in the morning and at bedtime.   gabapentin 300 MG capsule Commonly known as: NEURONTIN Take 1 capsule (300 mg total) by mouth 2 (two) times daily.   HYDROcodone-acetaminophen 7.5-325 MG tablet Commonly known as: NORCO Take 1 tablet by mouth every 4 (four) hours as needed for moderate pain.   hydrOXYzine  25 MG tablet Commonly known as: ATARAX/VISTARIL Take 0.5-1 tablets (12.5-25 mg total) by mouth 3 (three) times daily as needed.   Incruse Ellipta 62.5 MCG/INH Aepb Generic drug: umeclidinium bromide Inhale 1 puff into the lungs daily.   meclizine 12.5 MG tablet Commonly known as: ANTIVERT Take 1 tablet (12.5 mg total) by mouth 3 (three) times daily as needed for dizziness or nausea.   montelukast 10 MG tablet Commonly known as: SINGULAIR Take 1 tablet (10 mg total) by mouth at bedtime.   omeprazole 20 MG capsule Commonly known as: PRILOSEC Take 1 capsule (20 mg total) by mouth 2 (two) times daily.   ondansetron 8 MG tablet Commonly known as: ZOFRAN Take 8 mg by mouth every 8 (eight) hours as needed for nausea or vomiting.   prazosin 1 MG capsule Commonly known as: MINIPRESS TAKE 1 TO 2 CAPSULES (1 TO 2 MG TOTAL) BY MOUTH AT BEDTIME.   promethazine 25 MG tablet Commonly known as: PHENERGAN Take 1 tablet (25 mg total) by mouth daily.   rOPINIRole 2 MG tablet Commonly known as: REQUIP Take 0.5 tablets (1 mg total) by mouth at bedtime.   traZODone 50 MG tablet Commonly known as: DESYREL Take 1 tablet (50 mg total) by mouth at bedtime.   triamcinolone cream 0.1 % Commonly known as: KENALOG Apply 1 application topically 2 (two) times daily.   vancomycin 125 MG capsule Commonly known as: VANCOCIN Take 1 capsule (125 mg total) by mouth 4 (four) times daily for 8 days.       ASK your doctor about these medications    eletriptan 40 MG tablet Commonly known as: RELPAX TAKE 1 TABLET AT ONSET OF MIGRAINE OR HEADACHE AS NEEDED. MAY REPEAT IN 2 HOURS IF SYMPTOMS PERSIST.       Allergies  Allergen Reactions   Jasmine Oil Shortness Of Breath and Swelling   Levaquin [Levofloxacin] Shortness Of Breath, Itching, Swelling and Rash   Penicillins Anaphylaxis and Hives    Tolerated ceftriaxone   Aleve [Naproxen Sodium] Itching   Ceftin [Cefuroxime Axetil] Other (See  Comments)    unsure   Depo-Provera [Medroxyprogesterone Acetate] Other (See Comments)    Severe acne   Doxycycline Other (See Comments)    Raises blood pressure    Oxycodone Nausea And Vomiting   Tramadol Other (See Comments)    Kept her up all night    Follow-up Information     Maximiano Coss, NP Follow up in 1 week(s).   Specialty: Adult Health Nurse Practitioner Why: hospital discharge follow up, repeat cbc/bmp at follow up Contact information: [address] [place] [address] [phone]         [employer] [employer] Follow up.   Why: This is the supplier of your walker that will be delivered to the home Contact information: [place] [address] [phone]                  The results of significant diagnostics from this hospitalization (including imaging, microbiology, ancillary and laboratory) are listed below for reference.    Significant Diagnostic Studies: DG Chest 2 View  Result Date: 10/18/2020 CLINICAL DATA:  Chest pain. EXAM: CHEST - 2 VIEW COMPARISON:  07/28/2020.  04/07/2019.  CT 04/08/2019. FINDINGS: Mediastinum and hilar structures normal. Heart size normal. Mild left base and right mid lung subsegmental atelectasis/infiltrate. No pleural effusion or pneumothorax. IMPRESSION: Mild left base and right mid lung subsegmental atelectasis/infiltrate. Exam otherwise unremarkable. Electronically Signed   By: Marcello Moores  Register   On: 10/18/2020 05:33   DG Abd 1 View  Result Date: 10/19/2020 CLINICAL DATA:  Nausea and vomiting. EXAM: ABDOMEN - 1 VIEW COMPARISON:  None. FINDINGS: The bowel gas pattern is normal. No radio-opaque calculi or other significant radiographic abnormality are seen. Radiopaque surgical clips are seen within the right upper quadrant. IMPRESSION: Negative. Electronically Signed   By: Virgina Norfolk M.D.   On: 10/19/2020 18:51   CT Abdomen Pelvis W Contrast  Result Date: 10/18/2020 CLINICAL DATA:   Acute, nonlocalized abdominal pain EXAM: CT ABDOMEN AND PELVIS WITH CONTRAST TECHNIQUE: Multidetector CT imaging of the abdomen and pelvis was performed using the standard protocol following bolus administration of intravenous contrast. CONTRAST:  180mL OMNIPAQUE IOHEXOL 300 MG/ML  SOLN COMPARISON:  12/06/2015 FINDINGS: Lower chest:  No contributory findings. Hepatobiliary: Hepatic steatosis.Cholecystectomy. Pancreas: Unremarkable. Spleen: Unremarkable. Adrenals/Urinary Tract: Negative adrenals. No hydronephrosis or stone. Unremarkable bladder. Stomach/Bowel: Mild peritoneal thickening and slight fat stranding around 1 of multiple sigmoid diverticula. No bowel obstruction. No appendicitis. Vascular/Lymphatic: No acute vascular abnormality. Atheromatous calcification of the aorta. No mass or adenopathy. Reproductive:Hysterectomy. Other: No ascites or pneumoperitoneum. Musculoskeletal: No acute abnormalities. Lower lumbar disc degeneration and epidural lipomatosis. IMPRESSION: 1. Possible early sigmoid diverticulitis. 2. Hepatic steatosis. 3. Aortic atherosclerosis. Electronically Signed   By: Monte Fantasia M.D.   On: 10/18/2020 05:43   VAS [place] LOWER EXTREMITY VENOUS (DVT)  Result Date: 09/23/2020  Lower Venous DVT Study Patient Name:  BANI GIANFRANCESCO  Date of Exam:   09/23/2020 Medical Rec #: [id number]              Accession #:    [id number] Date of Birth: 04-Oct-1965               Patient Gender: F Patient Age:   54Y Exam Location:  [facility] Procedure:      VAS [place] LOWER EXTREMITY VENOUS (DVT) Referring Phys: [id number] DANIEL CAFFREY --------------------------------------------------------------------------------  Indications: Pain, and Swelling.  Comparison Study: 03/16/19 previous Performing Technologist: Abram Sander RVS  Examination Guidelines: A complete evaluation includes B-mode imaging, spectral Doppler, color Doppler, and power Doppler as needed of all accessible portions of each vessel.  Bilateral testing is considered an integral part of a complete examination. Limited examinations for reoccurring indications may be performed as noted. The reflux portion of the exam is performed with the patient in reverse Trendelenburg.  +---------+---------------+---------+-----------+----------+--------------+ RIGHT    CompressibilityPhasicitySpontaneityPropertiesThrombus Aging +---------+---------------+---------+-----------+----------+--------------+ CFV      Full           Yes      Yes                                 +---------+---------------+---------+-----------+----------+--------------+  SFJ      Full                                                        +---------+---------------+---------+-----------+----------+--------------+ FV Prox  Full                                                        +---------+---------------+---------+-----------+----------+--------------+ FV Mid   Full                                                        +---------+---------------+---------+-----------+----------+--------------+ FV DistalFull                                                        +---------+---------------+---------+-----------+----------+--------------+ PFV      Full                                                        +---------+---------------+---------+-----------+----------+--------------+ POP      Full           Yes      Yes                                 +---------+---------------+---------+-----------+----------+--------------+ PTV      Full                                                        +---------+---------------+---------+-----------+----------+--------------+ PERO     Full                                                        +---------+---------------+---------+-----------+----------+--------------+   +----+---------------+---------+-----------+----------+--------------+  LEFTCompressibilityPhasicitySpontaneityPropertiesThrombus Aging +----+---------------+---------+-----------+----------+--------------+ CFV Full           Yes      Yes                                 +----+---------------+---------+-----------+----------+--------------+     Summary: RIGHT: - There is no evidence of deep vein thrombosis in the lower extremity.  - No cystic structure found in the popliteal fossa.  LEFT: - No evidence of common femoral vein obstruction.  *See table(s) above for measurements and observations. Electronically signed by Servando Snare MD on  09/23/2020 at 4:58:12 PM.    Final     Microbiology: Recent Results (from the past 240 hour(s))  Resp Panel by RT-PCR (Flu A&B, Covid) Nasopharyngeal Swab     Status: None   Collection Time: 10/18/20  8:32 AM   Specimen: Nasopharyngeal Swab; Nasopharyngeal(NP) swabs in vial transport medium  Result Value Ref Range Status   SARS Coronavirus 2 by RT PCR NEGATIVE NEGATIVE Final    Comment: (NOTE) SARS-CoV-2 target nucleic acids are NOT DETECTED.  The SARS-CoV-2 RNA is generally detectable in upper respiratory specimens during the acute phase of infection. The lowest concentration of SARS-CoV-2 viral copies this assay can detect is 138 copies/mL. A negative result does not preclude SARS-Cov-2 infection and should not be used as the sole basis for treatment or other patient management decisions. A negative result may occur with  improper specimen collection/handling, submission of specimen other than nasopharyngeal swab, presence of viral mutation(s) within the areas targeted by this assay, and inadequate number of viral copies(<138 copies/mL). A negative result must be combined with clinical observations, patient history, and epidemiological information. The expected result is Negative.  Fact Sheet for Patients:  EntrepreneurPulse.com.au  Fact Sheet for Healthcare Providers:   IncredibleEmployment.be  This test is no t yet approved or cleared by the [place] FDA and  has been authorized for detection and/or diagnosis of SARS-CoV-2 by FDA under an Emergency Use Authorization (EUA). This EUA will remain  in effect (meaning this test can be used) for the duration of the COVID-19 declaration under Section 564(b)(1) of the Act, 21 U.S.C.section 360bbb-3(b)(1), unless the authorization is terminated  or revoked sooner.       Influenza A by PCR NEGATIVE NEGATIVE Final   Influenza B by PCR NEGATIVE NEGATIVE Final    Comment: (NOTE) The Xpert Xpress SARS-CoV-2/FLU/RSV plus assay is intended as an aid in the diagnosis of influenza from Nasopharyngeal swab specimens and should not be used as a sole basis for treatment. Nasal washings and aspirates are unacceptable for Xpert Xpress SARS-CoV-2/FLU/RSV testing.  Fact Sheet for Patients: EntrepreneurPulse.com.au  Fact Sheet for Healthcare Providers: IncredibleEmployment.be  This test is not yet approved or cleared by the [place] FDA and has been authorized for detection and/or diagnosis of SARS-CoV-2 by FDA under an Emergency Use Authorization (EUA). This EUA will remain in effect (meaning this test can be used) for the duration of the COVID-19 declaration under Section 564(b)(1) of the Act, 21 U.S.C. section 360bbb-3(b)(1), unless the authorization is terminated or revoked.  Performed at [facility], [place] [address] [place], [place] [address]   C Difficile Quick Screen w PCR reflex     Status: Abnormal   Collection Time: 10/18/20  1:44 PM   Specimen: STOOL  Result Value Ref Range Status   C Diff antigen POSITIVE (A) NEGATIVE Final   C Diff toxin NEGATIVE NEGATIVE Final   C Diff interpretation Results are indeterminate. See PCR results.  Final    Comment: Performed at [facility], [place] [address] [place], [place]  [address]  C. Diff by PCR, Reflexed     Status: Abnormal   Collection Time: 10/18/20  1:44 PM  Result Value Ref Range Status   Toxigenic C. Difficile by PCR POSITIVE (A) NEGATIVE Final    Comment: Positive for toxigenic C. difficile with little to no toxin production. Only treat if clinical presentation suggests symptomatic illness. Performed at [facility], [place] [address], [place],  [address]  Labs: Basic Metabolic Panel: Recent Labs  Lab 10/18/20 0356 10/19/20 0540 10/20/20 0531 10/21/20 0522  NA 134* 132* 134* 136  K 3.6 3.3* 3.8 3.4*  CL 98 100 99 98  CO2 23 22 27 31   GLUCOSE 120* 102* 108* 101*  BUN 18 7 <5* <5*  CREATININE 0.91 0.61 0.65 0.63  CALCIUM 8.3* 7.8* 8.3* 8.7*  MG  --   --  1.3* 2.0   Liver Function Tests: Recent Labs  Lab 10/18/20 0356  AST 28  ALT 16  ALKPHOS 120  BILITOT 0.5  PROT 7.5  ALBUMIN 4.1   Recent Labs  Lab 10/18/20 0356  LIPASE 33   No results for input(s): AMMONIA in the last 168 hours. CBC: Recent Labs  Lab 10/18/20 0356 10/19/20 0540 10/20/20 0531  WBC 18.4* 7.6 7.6  NEUTROABS 13.2*  --  5.1  HGB 16.0* 12.8 12.5  HCT 46.6* 38.7 36.8  MCV 92.1 93.9 93.6  PLT 255 148* 148*   Cardiac Enzymes: No results for input(s): CKTOTAL, CKMB, CKMBINDEX, TROPONINI in the last 168 hours. BNP: BNP (last 3 results) No results for input(s): BNP in the last 8760 hours.  ProBNP (last 3 results) No results for input(s): PROBNP in the last 8760 hours.  CBG: No results for input(s): GLUCAP in the last 168 hours.     Signed:  Florencia Reasons MD, PhD, FACP  [facility] 10/21/2020, 10:35 PM

## 2020-10-21 NOTE — Progress Notes (Signed)
Patient received discharge instructions and verbalized understanding.

## 2020-10-24 ENCOUNTER — Telehealth: Payer: Self-pay | Admitting: *Deleted

## 2020-10-24 NOTE — Telephone Encounter (Signed)
Transition Care Management Unsuccessful Follow-up Telephone Call  Date of discharge and from where:  [facility] 10-21-2020  Attempts:  1st Attempt  Reason for unsuccessful TCM follow-up call:  Left voice message

## 2020-10-25 ENCOUNTER — Inpatient Hospital Stay: Payer: 59 | Admitting: Registered Nurse

## 2020-10-27 ENCOUNTER — Ambulatory Visit (INDEPENDENT_AMBULATORY_CARE_PROVIDER_SITE_OTHER): Payer: 59

## 2020-10-27 ENCOUNTER — Ambulatory Visit: Payer: 59 | Admitting: Registered Nurse

## 2020-10-27 ENCOUNTER — Encounter (HOSPITAL_COMMUNITY): Payer: 59

## 2020-10-27 ENCOUNTER — Encounter: Payer: Self-pay | Admitting: Registered Nurse

## 2020-10-27 ENCOUNTER — Other Ambulatory Visit: Payer: Self-pay

## 2020-10-27 VITALS — BP 95/68 | HR 80 | Temp 98.3°F | Resp 18 | Ht 63.0 in | Wt 166.6 lb

## 2020-10-27 DIAGNOSIS — F32A Depression, unspecified: Secondary | ICD-10-CM

## 2020-10-27 DIAGNOSIS — R55 Syncope and collapse: Secondary | ICD-10-CM | POA: Diagnosis not present

## 2020-10-27 DIAGNOSIS — J302 Other seasonal allergic rhinitis: Secondary | ICD-10-CM

## 2020-10-27 DIAGNOSIS — Z09 Encounter for follow-up examination after completed treatment for conditions other than malignant neoplasm: Secondary | ICD-10-CM

## 2020-10-27 DIAGNOSIS — K219 Gastro-esophageal reflux disease without esophagitis: Secondary | ICD-10-CM | POA: Diagnosis not present

## 2020-10-27 LAB — CBC WITH DIFFERENTIAL/PLATELET
Basophils Absolute: 0 10*3/uL (ref 0.0–0.1)
Basophils Relative: 0.5 % (ref 0.0–3.0)
Eosinophils Absolute: 0.2 10*3/uL (ref 0.0–0.7)
Eosinophils Relative: 2.3 % (ref 0.0–5.0)
HCT: 38.4 % (ref 36.0–46.0)
Hemoglobin: 13.2 g/dL (ref 12.0–15.0)
Lymphocytes Relative: 22.6 % (ref 12.0–46.0)
Lymphs Abs: 1.7 10*3/uL (ref 0.7–4.0)
MCHC: 34.3 g/dL (ref 30.0–36.0)
MCV: 92.7 fl (ref 78.0–100.0)
Monocytes Absolute: 1.1 10*3/uL — ABNORMAL HIGH (ref 0.1–1.0)
Monocytes Relative: 14.1 % — ABNORMAL HIGH (ref 3.0–12.0)
Neutro Abs: 4.5 10*3/uL (ref 1.4–7.7)
Neutrophils Relative %: 60.5 % (ref 43.0–77.0)
Platelets: 408 10*3/uL — ABNORMAL HIGH (ref 150.0–400.0)
RBC: 4.15 Mil/uL (ref 3.87–5.11)
RDW: 13.6 % (ref 11.5–15.5)
WBC: 7.4 10*3/uL (ref 4.0–10.5)

## 2020-10-27 LAB — COMPREHENSIVE METABOLIC PANEL
ALT: 11 U/L (ref 0–35)
AST: 26 U/L (ref 0–37)
Albumin: 3.9 g/dL (ref 3.5–5.2)
Alkaline Phosphatase: 123 U/L — ABNORMAL HIGH (ref 39–117)
BUN: 9 mg/dL (ref 6–23)
CO2: 28 mEq/L (ref 19–32)
Calcium: 9.1 mg/dL (ref 8.4–10.5)
Chloride: 100 mEq/L (ref 96–112)
Creatinine, Ser: 0.65 mg/dL (ref 0.40–1.20)
GFR: 100.8 mL/min (ref >60.00)
Glucose, Bld: 87 mg/dL (ref 70–99)
Potassium: 3.4 mEq/L — ABNORMAL LOW (ref 3.5–5.1)
Sodium: 140 mEq/L (ref 135–145)
Total Bilirubin: 0.4 mg/dL (ref 0.2–1.2)
Total Protein: 6.8 g/dL (ref 6.0–8.3)

## 2020-10-27 MED ORDER — ALPRAZOLAM 1 MG PO TABS: 1.0000 mg | ORAL_TABLET | Freq: Three times a day (TID) | ORAL | 0 refills | Status: DC | PRN

## 2020-10-27 MED ORDER — AZELASTINE HCL 0.1 % NA SOLN: 1.0000 | Freq: Two times a day (BID) | NASAL | 12 refills | Status: DC

## 2020-10-27 MED ORDER — OMEPRAZOLE 20 MG PO CPDR
20.0000 mg | DELAYED_RELEASE_CAPSULE | Freq: Two times a day (BID) | ORAL | Status: DC
Start: 2020-10-27 — End: 2020-12-15

## 2020-10-27 NOTE — Patient Instructions (Addendum)
Mrs. Picardi -  Doristine Devoid to see you - so sorry it was so soon after last time!  Ok to increase gabapentin to 300 three times daily. Can do an additional one before bed if needed for further pain relief. Increasing dosage may give slightly more sedation, but being that you're not naive to this medication, I anticipate you'll be fine  Ears looked ok today - no infection, ear drums intact. Some mild bulging. Using the azelastine nasal spray should help. Ok to continue allergy meds and flonase as well.  I'll follow along with the doppler tomorrow and vascular should be calling soon.  Labs today should be back by tomorrow - I'll reach out with acute concerns.  Thank you  Rich    If you have lab work done today you will be contacted with your lab results within the next 2 weeks.  If you have not heard from [place] then please contact us. The fastest way to get your results is to register for My Chart.   IF you received an x-ray today, you will receive an invoice from [facility]. Please contact [facility] at [phone] with questions or concerns regarding your invoice.   IF you received labwork today, you will receive an invoice from [facility]. Please contact [facility] at [phone] with questions or concerns regarding your invoice.   Our billing staff will not be able to assist you with questions regarding bills from these companies.  You will be contacted with the lab results as soon as they are available. The fastest way to get your results is to activate your My Chart account. Instructions are located on the last page of this paperwork. If you have not heard from [place] regarding the results in 2 weeks, please contact this office.

## 2020-10-27 NOTE — Progress Notes (Signed)
 Established Patient Office Visit  Subjective:  Patient ID: Vicki [name], female    DOB: 06/11/1965  Age: 55 y.o. MRN: [id number]  CC:  Chief Complaint  Patient presents with  . Hospitalization Follow-up    Patient states she is here for a hospital follow up. Patient states she is also having some swelling in both feet.    HPI [name] presents for hfu  Feeling overall better. No ongoing episodes of syncope. Some mild dizziness and nausea. Dry heaving. Has been using meclizine with limited effect.  Has noted new swelling ble. It is painful and reddish. Not too warm. Has doppler scheduled for tomorrow. Has cardiology scheduled in mid July.   Does have a compression stocking she has used in the past - OTC. Would be interested in consult with vascular.  We do need to repeat labs cbc and cmp per hospitalist request.  Some popping in ears bilaterally. Long hx of seasonal allergies. Has used flonase, had worked better in the past. Does use otc antihistamine.  Does need refill on omeprazole and alprazolam. No Aes, tolerates well.  Notes that positively, hives have not recurred. Unfortunately was not able to get answers on this from derm or allergy. Will put this on back burner for now.    Past Medical History:  Diagnosis Date  . Allergy    seasonal allergies  . Anxiety    on meds  . Arthritis    hands  . Asthma    childhood-current  . Carpal tunnel syndrome    had bilateral sx to tx  . Cataract    bilateral--no sx yet  . COPD (chronic obstructive pulmonary disease) (HCC)   . Depression    on meds  . Diverticulitis   . Emphysema of lung (HCC)    uses MDI  . Essential hypertension, benign 07/14/2019   on meds  . Hyperlipidemia    on meds  . Oxygen deficiency    home O2 at 2.5L/[place] nightly  . Oxygen dependent    uses O2 at 2.5L/[place] nightly    Past Surgical History:  Procedure Laterality Date  . ABDOMINAL HYSTERECTOMY  2009  . APPENDECTOMY    .  ARTHROSCOPIC REPAIR ACL Left 2011/2014   x 2  . BIOPSY  01/21/2020   Procedure: BIOPSY;  Surgeon: Nandigam, Kavitha V, MD;  [facility];  Service: Endoscopy;;  EGD and COLON  . BUNIONECTOMY  2006   both  . CARPAL TUNNEL RELEASE Bilateral 10/28/2013   Procedure: BILATERAL CARPAL TUNNEL RELEASE;  Surgeon: Gary R Kuzma, MD;  Location: [facility];  Service: Orthopedics;  Laterality: Bilateral;  . CHOLECYSTECTOMY  1994  . CHONDROPLASTY Right 08/31/2020   Procedure: CHONDROPLASTY;  Surgeon: Caffrey, Daniel, MD;  Location: [facility];  Service: Orthopedics;  Laterality: Right;  . COLONOSCOPY WITH PROPOFOL N/A 01/21/2020   Procedure: COLONOSCOPY WITH PROPOFOL;  Surgeon: Nandigam, Kavitha V, MD;  [facility];  Service: Endoscopy;  Laterality: N/A;  . DILATION AND CURETTAGE OF UTERUS    . ESOPHAGOGASTRODUODENOSCOPY (EGD) WITH PROPOFOL N/A 01/21/2020   Procedure: ESOPHAGOGASTRODUODENOSCOPY (EGD) WITH PROPOFOL;  Surgeon: Nandigam, Kavitha V, MD;  [facility];  Service: Endoscopy;  Laterality: N/A;  . KNEE ARTHROSCOPY WITH ANTERIOR CRUCIATE LIGAMENT (ACL) REPAIR Right 08/31/2020   Procedure: RIGHT KNEE ARTHROSCOPY WITH ANTERIOR CRUCIATE LIGAMENT (ACL) REPAIR MEDIAL AND LATERAL  MENISECTOMY;  Surgeon: Caffrey, Daniel, MD;  Location: [facility];  Service: Orthopedics;  Laterality: Right;    FEMORAL NERVE BLOCK  . POLYPECTOMY  01/21/2020   Procedure: POLYPECTOMY;  Surgeon: Nandigam, Kavitha V, MD;  [facility];  Service: Endoscopy;;  . TUBAL LIGATION  2004    Family History  Problem Relation Age of Onset  . Brain cancer Mother 73  . Lung cancer Mother 72  . Breast cancer Sister 45  . Ovarian cancer Sister 53  . Colon cancer Neg Hx   . Colon polyps Neg Hx   . Esophageal cancer Neg Hx   . Rectal cancer Neg Hx   . Stomach cancer Neg Hx     Social History   Socioeconomic History  . Marital status: Married    Spouse name:  Wayne  . Number of children: 3  . Years of education: 12th grade  . Highest education level: Not on file  Occupational History  . Occupation: Cook    Employer: [employer]  Tobacco Use  . Smoking status: Every Day    Packs/day: 1.00    Years: 34.00    Pack years: 34.00    Types: Cigarettes  . Smokeless tobacco: Never  Vaping Use  . Vaping Use: Never used  Substance and Sexual Activity  . Alcohol use: Yes    Comment: everyday  . Drug use: No  . Sexual activity: Yes    Birth control/protection: Surgical  Other Topics Concern  . Not on file  Social History Narrative   Lives with her husband.   Their 3 children live independently.   Social Determinants of Health   Financial Resource Strain: Not on file  Food Insecurity: Not on file  Transportation Needs: Not on file  Physical Activity: Not on file  Stress: Not on file  Social Connections: Not on file  Intimate Partner Violence: Not on file    Outpatient Medications Prior to Visit  Medication Sig Dispense Refill  . acetaminophen (TYLENOL) 500 MG tablet Take 1,000 mg by mouth every 6 (six) hours as needed for moderate pain.    . albuterol (VENTOLIN HFA) 108 (90 Base) MCG/ACT inhaler Inhale 2 puffs into the lungs 2 (two) times daily. 18 g 11  . amLODipine (NORVASC) 5 MG tablet Take 1 tablet (5 mg total) by mouth daily. 200 tablet 0  . aspirin EC 81 MG tablet Take 1 tablet (81 mg total) by mouth daily.    . cetirizine (ZYRTEC) 10 MG tablet Take 1 tablet (10 mg total) by mouth daily. 30 tablet 11  . citalopram (CELEXA) 20 MG tablet Take 3 tablets (60 mg total) by mouth daily. 270 tablet 2  . eletriptan (RELPAX) 40 MG tablet TAKE 1 TABLET AT ONSET OF MIGRAINE OR HEADACHE AS NEEDED. MAY REPEAT IN 2 HOURS IF SYMPTOMS PERSIST. (Patient taking differently: Take 40 mg by mouth every 2 (two) hours as needed for migraine or headache.) 9 tablet 0  . famotidine (PEPCID) 20 MG tablet Take 1 tablet (20 mg total) by mouth daily.     . fexofenadine (ALLEGRA) 180 MG tablet Take 1 tablet (180 mg total) by mouth daily.    . Fluticasone-Salmeterol (ADVAIR DISKUS) 500-50 MCG/DOSE AEPB Inhale 1 puff into the lungs in the morning and at bedtime. 60 each 11  . gabapentin (NEURONTIN) 300 MG capsule Take 1 capsule (300 mg total) by mouth 2 (two) times daily. 180 capsule 3  . HYDROcodone-acetaminophen (NORCO) 7.5-325 MG tablet Take 1 tablet by mouth every 4 (four) hours as needed for moderate pain. 40 tablet 0  . hydrOXYzine (ATARAX/VISTARIL) 25   MG tablet Take 0.5-1 tablets (12.5-25 mg total) by mouth 3 (three) times daily as needed. 30 tablet 0  . meclizine (ANTIVERT) 12.5 MG tablet Take 1 tablet (12.5 mg total) by mouth 3 (three) times daily as needed for dizziness or nausea. 30 tablet 0  . Menthol, Topical Analgesic, (BIOFREEZE EX) Apply 1 application topically daily as needed (back pain).    . montelukast (SINGULAIR) 10 MG tablet Take 1 tablet (10 mg total) by mouth at bedtime. 30 tablet 3  . ondansetron (ZOFRAN) 8 MG tablet Take 8 mg by mouth every 8 (eight) hours as needed for nausea or vomiting.    . prazosin (MINIPRESS) 1 MG capsule TAKE 1 TO 2 CAPSULES (1 TO 2 MG TOTAL) BY MOUTH AT BEDTIME. 180 capsule 2  . promethazine (PHENERGAN) 25 MG tablet Take 1 tablet (25 mg total) by mouth daily. 60 tablet 3  . rOPINIRole (REQUIP) 2 MG tablet Take 0.5 tablets (1 mg total) by mouth at bedtime. 270 tablet 0  . traZODone (DESYREL) 50 MG tablet Take 1 tablet (50 mg total) by mouth at bedtime.    . triamcinolone cream (KENALOG) 0.1 % Apply 1 application topically 2 (two) times daily. 30 g 0  . umeclidinium bromide (INCRUSE ELLIPTA) 62.5 MCG/INH AEPB Inhale 1 puff into the lungs daily.    . vancomycin (VANCOCIN) 125 MG capsule Take 1 capsule (125 mg total) by mouth 4 (four) times daily for 8 days. 32 capsule 0  . ALPRAZolam (XANAX) 1 MG tablet Take 1 tablet (1 mg total) by mouth 3 (three) times daily as needed for anxiety. 120 tablet 0  .  omeprazole (PRILOSEC) 20 MG capsule Take 1 capsule (20 mg total) by mouth 2 (two) times daily.     No facility-administered medications prior to visit.    Allergies  Allergen Reactions  . Jasmine Oil Shortness Of Breath and Swelling  . Levaquin [Levofloxacin] Shortness Of Breath, Itching, Swelling and Rash  . Penicillins Anaphylaxis and Hives    Tolerated ceftriaxone  . Aleve [Naproxen Sodium] Itching  . Ceftin [Cefuroxime Axetil] Other (See Comments)    unsure  . Depo-Provera [Medroxyprogesterone Acetate] Other (See Comments)    Severe acne  . Doxycycline Other (See Comments)    Raises blood pressure   . Oxycodone Nausea And Vomiting  . Tramadol Other (See Comments)    Kept her up all night    ROS Review of Systems Per hpi, otherwise negative.    Objective:    Physical Exam Vitals and nursing note reviewed.  Constitutional:      General: She is not in acute distress.    Appearance: Normal appearance. She is normal weight. She is not ill-appearing, toxic-appearing or diaphoretic.  HENT:     Right Ear: Tympanic membrane, ear canal and external ear normal.     Left Ear: Tympanic membrane, ear canal and external ear normal.  Cardiovascular:     Rate and Rhythm: Normal rate and regular rhythm.     Heart sounds: Normal heart sounds. No murmur heard.   No friction rub. No gallop.  Pulmonary:     Effort: Pulmonary effort is normal. No respiratory distress.     Breath sounds: Normal breath sounds. No stridor. No wheezing, rhonchi or rales.  Chest:     Chest wall: No tenderness.  Skin:    General: Skin is warm and dry.  Neurological:     General: No focal deficit present.     Mental Status: She is alert  and oriented to person, place, and time. Mental status is at baseline.  Psychiatric:        Mood and Affect: Mood normal.        Behavior: Behavior normal.        Thought Content: Thought content normal.        Judgment: Judgment normal.    BP 95/68   Pulse 80    Temp 98.3 F (36.8 C) (Temporal)   Resp 18   Ht 5' 3" (1.6 m)   Wt 166 lb 9.6 oz (75.6 kg)   SpO2 99%   BMI 29.51 kg/m  Wt Readings from Last 3 Encounters:  10/27/20 166 lb 9.6 oz (75.6 kg)  10/18/20 170 lb (77.1 kg)  10/07/20 170 lb 12.8 oz (77.5 kg)     There are no preventive care reminders to display for this patient.  There are no preventive care reminders to display for this patient.  Lab Results  Component Value Date   TSH 2.72 10/04/2020   Lab Results  Component Value Date   WBC 7.6 10/20/2020   HGB 12.5 10/20/2020   HCT 36.8 10/20/2020   MCV 93.6 10/20/2020   PLT 148 (L) 10/20/2020   Lab Results  Component Value Date   NA 136 10/21/2020   K 3.4 (L) 10/21/2020   CO2 31 10/21/2020   GLUCOSE 101 (H) 10/21/2020   BUN <5 (L) 10/21/2020   CREATININE 0.63 10/21/2020   BILITOT 0.5 10/18/2020   ALKPHOS 120 10/18/2020   AST 28 10/18/2020   ALT 16 10/18/2020   PROT 7.5 10/18/2020   ALBUMIN 4.1 10/18/2020   CALCIUM 8.7 (L) 10/21/2020   ANIONGAP 7 10/21/2020   EGFR 81 10/07/2020   GFR 92.67 10/04/2020   Lab Results  Component Value Date   CHOL 207 (H) 07/14/2019   Lab Results  Component Value Date   HDL 66 07/14/2019   Lab Results  Component Value Date   LDLCALC 96 07/14/2019   Lab Results  Component Value Date   TRIG 274 (H) 07/14/2019   Lab Results  Component Value Date   CHOLHDL 3.1 07/14/2019   Lab Results  Component Value Date   HGBA1C 5.4 08/18/2020      Assessment & Plan:   Problem List Items Addressed This Visit       Other   Depression   Relevant Medications   ALPRAZolam (XANAX) 1 MG tablet   Other Visit Diagnoses     Gastroesophageal reflux disease without esophagitis    -  Primary   Relevant Medications   omeprazole (PRILOSEC) 20 MG capsule   Hospital discharge follow-up       Relevant Orders   CBC with Differential/Platelet   Comprehensive metabolic panel   Seasonal allergies       Relevant Medications    azelastine (ASTELIN) 0.1 % nasal spray       Meds ordered this encounter  Medications  . ALPRAZolam (XANAX) 1 MG tablet    Sig: Take 1 tablet (1 mg total) by mouth 3 (three) times daily as needed for anxiety.    Dispense:  120 tablet    Refill:  0  . omeprazole (PRILOSEC) 20 MG capsule    Sig: Take 1 capsule (20 mg total) by mouth 2 (two) times daily.  Marland Kitchen azelastine (ASTELIN) 0.1 % nasal spray    Sig: Place 1 spray into both nostrils 2 (two) times daily. Use in each nostril as directed    Dispense:  30  mL    Refill:  12    Order Specific Question:   Supervising Provider    Answer:   Carlota Raspberry, JEFFREY R [[id number]]    Follow-up: Return in about 3 months (around 01/27/2021) for chronic conditions.   PLAN Exam reassuring.  Doppler tomorrow for ruling out dvt. Cbc and cmp sent today Refill alprazolam and omeprazole Try azelastine for ear popping. Patient encouraged to call clinic with any questions, comments, or concerns. Maximiano Coss, NP

## 2020-10-28 ENCOUNTER — Encounter: Payer: Self-pay | Admitting: Registered Nurse

## 2020-10-28 ENCOUNTER — Ambulatory Visit (HOSPITAL_COMMUNITY)
Admission: RE | Admit: 2020-10-28 | Discharge: 2020-10-28 | Disposition: A | Discharge status: Home / Self Care | Payer: 59 | Source: Ambulatory Visit | Attending: Orthopedic Surgery | Admitting: Orthopedic Surgery

## 2020-10-28 ENCOUNTER — Other Ambulatory Visit (HOSPITAL_COMMUNITY): Payer: Self-pay | Admitting: Orthopedic Surgery

## 2020-10-28 DIAGNOSIS — M79661 Pain in right lower leg: Secondary | ICD-10-CM | POA: Diagnosis not present

## 2020-10-28 DIAGNOSIS — M7989 Other specified soft tissue disorders: Secondary | ICD-10-CM | POA: Diagnosis not present

## 2020-10-28 DIAGNOSIS — I700 Atherosclerosis of aorta: Secondary | ICD-10-CM | POA: Insufficient documentation

## 2020-11-01 ENCOUNTER — Other Ambulatory Visit: Payer: Self-pay | Admitting: Registered Nurse

## 2020-11-01 DIAGNOSIS — D75839 Thrombocytosis, unspecified: Secondary | ICD-10-CM

## 2020-11-07 ENCOUNTER — Other Ambulatory Visit: Payer: Self-pay | Admitting: Allergy & Immunology

## 2020-11-10 ENCOUNTER — Other Ambulatory Visit: Payer: 59

## 2020-11-11 ENCOUNTER — Other Ambulatory Visit: Payer: Self-pay

## 2020-11-11 ENCOUNTER — Other Ambulatory Visit (INDEPENDENT_AMBULATORY_CARE_PROVIDER_SITE_OTHER): Payer: 59

## 2020-11-11 DIAGNOSIS — D75839 Thrombocytosis, unspecified: Secondary | ICD-10-CM | POA: Diagnosis not present

## 2020-11-11 LAB — CBC WITH DIFFERENTIAL/PLATELET
Basophils Absolute: 0 10*3/uL (ref 0.0–0.1)
Basophils Relative: 0.4 % (ref 0.0–3.0)
Eosinophils Absolute: 0.2 10*3/uL (ref 0.0–0.7)
Eosinophils Relative: 1.9 % (ref 0.0–5.0)
HCT: 39.8 % (ref 36.0–46.0)
Hemoglobin: 13.4 g/dL (ref 12.0–15.0)
Lymphocytes Relative: 20.9 % (ref 12.0–46.0)
Lymphs Abs: 1.8 10*3/uL (ref 0.7–4.0)
MCHC: 33.6 g/dL (ref 30.0–36.0)
MCV: 94.6 fl (ref 78.0–100.0)
Monocytes Absolute: 0.9 10*3/uL (ref 0.1–1.0)
Monocytes Relative: 10.5 % (ref 3.0–12.0)
Neutro Abs: 5.8 10*3/uL (ref 1.4–7.7)
Neutrophils Relative %: 66.3 % (ref 43.0–77.0)
Platelets: 352 10*3/uL (ref 150.0–400.0)
RBC: 4.2 Mil/uL (ref 3.87–5.11)
RDW: 14.1 % (ref 11.5–15.5)
WBC: 8.7 10*3/uL (ref 4.0–10.5)

## 2020-11-15 LAB — PATHOLOGIST SMEAR REVIEW

## 2020-11-21 ENCOUNTER — Other Ambulatory Visit: Payer: Self-pay | Admitting: Allergy & Immunology

## 2020-11-29 NOTE — Progress Notes (Deleted)
Cardiology Office Note:    Date:  11/29/2020   ID:  Vicki [name], DOB 19-Jul-1965, MRN [id number]  PCP:  Maximiano Coss, NP  Cardiologist:  None  Electrophysiologist:  None   Referring MD: Maximiano Coss, NP   Chief Complaint: follow-up to discuss monitor results  History of Present Illness:    Vicki [name] is a 55 y.o. female with a history of COPD with emphysema, hypertension, hyperlipidemia, anxiety/depression, and tobacco abuse who is followed by Dr. Debara Pickett and presents today for follow-up of Event Monitor and syncope.   Patient was referred to Dr. Debara Pickett in 08/2019 for further evaluation of dyspnea on exertion and chest pain while walking only short distances. Given strong family history of early onset heart disease, Myoview was ordered for further evaluation. Myoview was low risk with no evidence of ischemia. Lower extremity dopplers/ABIs were also ordered given reports of leg heaviness and were normal with no evidence of PAD. Echo in 09/2019 showed LVEF of 60-65% with normal wall motion. RV was mildly enlarged with normal systolic function. Patient has not been seen by Cardiology since that time.   She was admitted in 10/2020 with sepsis secondary to C. diff colitis and possible diverticulitis and aspiration pneumonia. She was treated with antibiotics. Patient reported an episode of syncope while in the shower prior to admission and stated this occurs occasional while standing up. She also reported tinnitus. Cardiology was asked to arrange outpatient monitor at discharge but we were not formally consulted during admission. She was also started on meclizine and instructed to follow-up with ENT for vertigo and tinnitus.   Patient presents today for follow-up of monitor. ***  Syncope  - *** - Patient just finished wearing Event Monitor. Formal read by MD pending but I was able to pull up the report and it does not look like there were any significant arrhythmias to explain  syncope. Underlying sinus rhythm with average heart rate of 95 bpm. No atrial fibrillation. No bradycardia or pauses. Short runs of NSVT noted of 4-5 beats.   Chronic Dyspnea on Exertion  History of Chest Pressure  - *** - Myoview in 08/2019 was low risk with no evidence of ischemia.  - Echo in 09/2019 showed LVEF of 60-65%.   Hypertension  - Well controlled.  - Continue Amlodipine 5mg  daily.   Hyperlipidemia  - History of hyperlipidemia listed in chart but not on any medications.  - ***  Past Medical History:  Diagnosis Date   Allergy    seasonal allergies   Anxiety    on meds   Arthritis    hands   Asthma    childhood-current   Carpal tunnel syndrome    had bilateral sx to tx   Cataract    bilateral--no sx yet   COPD (chronic obstructive pulmonary disease) ([place])    Depression    on meds   Diverticulitis    Emphysema of lung ([place])    uses MDI   Essential hypertension, benign 07/14/2019   on meds   Hyperlipidemia    on meds   Oxygen deficiency    home O2 at 2.5L/[place] nightly   Oxygen dependent    uses O2 at 2.5L/[place] nightly    Past Surgical History:  Procedure Laterality Date   ABDOMINAL HYSTERECTOMY  2009   APPENDECTOMY     ARTHROSCOPIC REPAIR ACL Left 2011/2014   x 2   BIOPSY  01/21/2020   Procedure: BIOPSY;  Surgeon: Mauri Pole, MD;  [facility];  Service: Endoscopy;;  EGD and COLON   BUNIONECTOMY  2006   both   CARPAL TUNNEL RELEASE Bilateral 10/28/2013   Procedure: BILATERAL CARPAL TUNNEL RELEASE;  Surgeon: Wynonia Sours, MD;  Location: [place];  Service: Orthopedics;  Laterality: Bilateral;   CHOLECYSTECTOMY  1994   CHONDROPLASTY Right 08/31/2020   Procedure: CHONDROPLASTY;  Surgeon: Earlie Server, MD;  Location: [place];  Service: Orthopedics;  Laterality: Right;   COLONOSCOPY WITH PROPOFOL N/A 01/21/2020   Procedure: COLONOSCOPY WITH PROPOFOL;  Surgeon: Mauri Pole, MD;  [facility];   Service: Endoscopy;  Laterality: N/A;   DILATION AND CURETTAGE OF UTERUS     ESOPHAGOGASTRODUODENOSCOPY (EGD) WITH PROPOFOL N/A 01/21/2020   Procedure: ESOPHAGOGASTRODUODENOSCOPY (EGD) WITH PROPOFOL;  Surgeon: Mauri Pole, MD;  [facility];  Service: Endoscopy;  Laterality: N/A;   KNEE ARTHROSCOPY WITH ANTERIOR CRUCIATE LIGAMENT (ACL) REPAIR Right 08/31/2020   Procedure: RIGHT KNEE ARTHROSCOPY WITH ANTERIOR CRUCIATE LIGAMENT (ACL) REPAIR MEDIAL AND LATERAL  MENISECTOMY;  Surgeon: Earlie Server, MD;  Location: [place];  Service: Orthopedics;  Laterality: Right;  FEMORAL NERVE BLOCK   POLYPECTOMY  01/21/2020   Procedure: POLYPECTOMY;  Surgeon: Mauri Pole, MD;  [facility];  Service: Endoscopy;;   TUBAL LIGATION  2004    Current Medications: No outpatient medications have been marked as taking for the 12/01/20 encounter (Appointment) with Darreld Mclean, PA-C.     Allergies:   Jasmine oil, Levaquin [levofloxacin], Penicillins, Aleve [naproxen sodium], Ceftin [cefuroxime axetil], Depo-provera [medroxyprogesterone acetate], Doxycycline, Oxycodone, and Tramadol   Social History   Socioeconomic History   Marital status: Married    Spouse name: [name]   Number of children: 3   Years of education: 12th grade   Highest education level: Not on file  Occupational History   Occupation: Airline pilot: [employer]  Tobacco Use   Smoking status: Every Day    Packs/day: 1.00    Years: 34.00    Pack years: 34.00    Types: Cigarettes   Smokeless tobacco: Never  Vaping Use   Vaping Use: Never used  Substance and Sexual Activity   Alcohol use: Yes    Comment: everyday   Drug use: No   Sexual activity: Yes    Birth control/protection: Surgical  Other Topics Concern   Not on file  Social History Narrative   Lives with her husband.   Their 3 children live independently.   Social Determinants of Health   Financial  Resource Strain: Not on file  Food Insecurity: Not on file  Transportation Needs: Not on file  Physical Activity: Not on file  Stress: Not on file  Social Connections: Not on file     Family History: The patient's family history includes Brain cancer (age of onset: 21) in her mother; Breast cancer (age of onset: 48) in her sister; Lung cancer (age of onset: 22) in her mother; Ovarian cancer (age of onset: 44) in her sister. There is no history of Colon cancer, Colon polyps, Esophageal cancer, Rectal cancer, or Stomach cancer.  ROS:   Please see the history of present illness.     EKGs/Labs/Other Studies Reviewed:    The following studies were reviewed today:  Lexiscan Myoview 09/09/2019:  - The left ventricular ejection fraction is hyperdynamic (>65%).  - Nuclear stress EF: 66%.  - No T wave inversion was noted during stress.  - There was no  ST segment deviation noted during stress.  - This is a low risk study.     No ischemia. LVEF 66% with normal wall motion. This is a low risk study.  _______________   Lower Extremity Arterial Ultrasound/ABI 09/11/2019:  Summary: - Right: Normal examination. No evidence of arterial occlusive disease.  - Left: Normal examination. No evidence of arterial occlusive disease.   - Right: Resting right ankle-brachial index is within normal range. No  evidence of significant right lower extremity arterial disease. The right  toe-brachial index is normal.  - Left: Resting left ankle-brachial index is within normal range. No  evidence of significant left lower extremity arterial disease. The left  toe-brachial index is normal.  _______________   Echocardiogram 10/07/2019:  Impressions:  1. Left ventricular ejection fraction, by estimation, is 60 to 65%. The  left ventricle has normal function. The left ventricle has no regional  wall motion abnormalities. Left ventricular diastolic parameters were  normal.   2. Right ventricular systolic function  is normal. The right ventricular  size is mildly enlarged. Tricuspid regurgitation signal is inadequate for  assessing PA pressure.   3. The mitral valve is normal in structure. No evidence of mitral valve  regurgitation.   4. The aortic valve was not well visualized. Aortic valve regurgitation  is not visualized. No aortic stenosis is present.   5. The inferior vena cava is normal in size with greater than 50%  respiratory variability, suggesting right atrial pressure of 3 mmHg.   EKG:  EKG not ordered today.   Recent Labs: 10/04/2020: TSH 2.72 10/21/2020: Magnesium 2.0 10/27/2020: ALT 11; BUN 9; Creatinine, Ser 0.65; Potassium 3.4; Sodium 140 11/11/2020: Hemoglobin 13.4; Platelets 352.0  Recent Lipid Panel    Component Value Date/Time   CHOL 207 (H) 07/14/2019 1458   TRIG 274 (H) 07/14/2019 1458   HDL 66 07/14/2019 1458   CHOLHDL 3.1 07/14/2019 1458   LDLCALC 96 07/14/2019 1458    Physical Exam:    Vital Signs: There were no vitals taken for this visit.    Wt Readings from Last 3 Encounters:  10/27/20 166 lb 9.6 oz (75.6 kg)  10/18/20 170 lb (77.1 kg)  10/07/20 170 lb 12.8 oz (77.5 kg)     General: 55 y.o. female in no acute distress. HEENT: Normocephalic and atraumatic. Sclera clear. EOMs intact. Neck: Supple. No carotid bruits. No JVD. Heart: *** RRR. Distinct S1 and S2. No murmurs, gallops, or rubs. Radial and distal pedal pulses 2+ and equal bilaterally. Lungs: No increased work of breathing. Clear to ausculation bilaterally. No wheezes, rhonchi, or rales.  Abdomen: Soft, non-distended, and non-tender to palpation. Bowel sounds present in all 4 quadrants.  MSK: Normal strength and tone for age. *** Extremities: No lower extremity edema.    Skin: Warm and dry. Neuro: Alert and oriented x3. No focal deficits. Psych: Normal affect. Responds appropriately.   Assessment:    No diagnosis found.  Plan:     Disposition: Follow up in ***   Medication  Adjustments/Labs and Tests Ordered: Current medicines are reviewed at length with the patient today.  Concerns regarding medicines are outlined above.  No orders of the defined types were placed in this encounter.  No orders of the defined types were placed in this encounter.   There are no Patient Instructions on file for this visit.   Signed, Darreld Mclean, PA-C  11/29/2020 8:44 AM    [facility]

## 2020-12-01 ENCOUNTER — Ambulatory Visit: Payer: 59 | Admitting: Student

## 2020-12-09 ENCOUNTER — Other Ambulatory Visit: Payer: Self-pay | Admitting: Registered Nurse

## 2020-12-09 DIAGNOSIS — I10 Essential (primary) hypertension: Secondary | ICD-10-CM

## 2020-12-15 ENCOUNTER — Telehealth: Payer: Self-pay

## 2020-12-15 ENCOUNTER — Other Ambulatory Visit: Payer: Self-pay

## 2020-12-15 DIAGNOSIS — R112 Nausea with vomiting, unspecified: Secondary | ICD-10-CM

## 2020-12-15 DIAGNOSIS — G43909 Migraine, unspecified, not intractable, without status migrainosus: Secondary | ICD-10-CM

## 2020-12-15 DIAGNOSIS — I10 Essential (primary) hypertension: Secondary | ICD-10-CM

## 2020-12-15 DIAGNOSIS — K219 Gastro-esophageal reflux disease without esophagitis: Secondary | ICD-10-CM

## 2020-12-15 MED ORDER — OMEPRAZOLE 20 MG PO CPDR: 20.0000 mg | DELAYED_RELEASE_CAPSULE | Freq: Two times a day (BID) | ORAL | 1 refills | Status: DC

## 2020-12-15 MED ORDER — PRAZOSIN HCL 1 MG PO CAPS: ORAL_CAPSULE | ORAL | 1 refills | Status: DC

## 2020-12-15 NOTE — Telephone Encounter (Signed)
Request sent to Dr. Carlota Raspberry for approval.

## 2020-12-15 NOTE — Telephone Encounter (Signed)
Pt needs refill on omeprazole (PRILOSEC) 20 MG capsule , meclizine (ANTIVERT) 12.5 MG tablet , prazosin (MINIPRESS) 1 MG capsule , eletriptan (RELPAX) 40 MG tablet , promethazine (PHENERGAN) 25 MG tablet   [id number]- [place],  - [address]   Meclizine LFD 10/21/20 #30 with no refills Eletriptan LFD 09/14/20 #9 with no refills Promethazine LFD 06/13/20 #60 with 3 refills, patient should not be out at this time  LOV 10/27/20 NOV none

## 2020-12-15 NOTE — Telephone Encounter (Signed)
Pt needs refill on omeprazole (PRILOSEC) 20 MG capsule , meclizine (ANTIVERT) 12.5 MG tablet , prazosin (MINIPRESS) 1 MG capsule , eletriptan (RELPAX) 40 MG tablet , promethazine (PHENERGAN) 25 MG tablet   [id number] - [address] [place]   Pt call back [phone]

## 2020-12-16 ENCOUNTER — Other Ambulatory Visit: Payer: Self-pay | Admitting: Family Medicine

## 2020-12-16 DIAGNOSIS — G43909 Migraine, unspecified, not intractable, without status migrainosus: Secondary | ICD-10-CM

## 2020-12-16 MED ORDER — PROMETHAZINE HCL 25 MG PO TABS: 25.0000 mg | ORAL_TABLET | Freq: Every day | ORAL | 0 refills | Status: DC

## 2020-12-16 MED ORDER — MECLIZINE HCL 12.5 MG PO TABS: 12.5000 mg | ORAL_TABLET | Freq: Three times a day (TID) | ORAL | 0 refills | Status: DC | PRN

## 2020-12-16 MED ORDER — ELETRIPTAN HYDROBROMIDE 40 MG PO TABS: ORAL_TABLET | ORAL | 0 refills | Status: DC

## 2020-12-16 NOTE — Telephone Encounter (Signed)
Pt is breaking out in hives still

## 2020-12-16 NOTE — Telephone Encounter (Signed)
Pt is calling back on the meclizine (antivert) 12.5 mg, promethazine (phenergan) '25mg'$ , eletriptan (relpax) 40 mg tablet will she need a follow up with Richard in order to get these refills  Pt call back [phone]

## 2020-12-16 NOTE — Telephone Encounter (Signed)
Temporary refills provided, will forward to PCP to decide on further refills

## 2020-12-27 ENCOUNTER — Telehealth (INDEPENDENT_AMBULATORY_CARE_PROVIDER_SITE_OTHER): Payer: 59 | Admitting: Registered Nurse

## 2020-12-27 ENCOUNTER — Other Ambulatory Visit: Payer: Self-pay

## 2020-12-27 ENCOUNTER — Encounter: Payer: Self-pay | Admitting: Registered Nurse

## 2020-12-27 DIAGNOSIS — G2581 Restless legs syndrome: Secondary | ICD-10-CM

## 2020-12-27 DIAGNOSIS — R112 Nausea with vomiting, unspecified: Secondary | ICD-10-CM

## 2020-12-27 DIAGNOSIS — L508 Other urticaria: Secondary | ICD-10-CM

## 2020-12-27 DIAGNOSIS — I10 Essential (primary) hypertension: Secondary | ICD-10-CM

## 2020-12-27 DIAGNOSIS — Z87892 Personal history of anaphylaxis: Secondary | ICD-10-CM | POA: Diagnosis not present

## 2020-12-27 DIAGNOSIS — R42 Dizziness and giddiness: Secondary | ICD-10-CM

## 2020-12-27 MED ORDER — DAPSONE 25 MG PO TABS: ORAL_TABLET | ORAL | 0 refills | Status: DC

## 2020-12-27 MED ORDER — EPINEPHRINE 0.3 MG/0.3ML IJ SOAJ: 0.3000 mg | INTRAMUSCULAR | 3 refills | Status: AC | PRN

## 2020-12-27 MED ORDER — PRAZOSIN HCL 1 MG PO CAPS: ORAL_CAPSULE | ORAL | 1 refills | Status: DC

## 2020-12-27 MED ORDER — PROMETHAZINE HCL 25 MG PO TABS: 25.0000 mg | ORAL_TABLET | Freq: Three times a day (TID) | ORAL | 1 refills | Status: DC | PRN

## 2020-12-27 MED ORDER — ROPINIROLE HCL 2 MG PO TABS: 1.0000 mg | ORAL_TABLET | Freq: Every day | ORAL | 1 refills | Status: DC

## 2020-12-27 MED ORDER — MECLIZINE HCL 25 MG PO TABS: 25.0000 mg | ORAL_TABLET | Freq: Three times a day (TID) | ORAL | 1 refills | Status: DC | PRN

## 2020-12-27 NOTE — Progress Notes (Signed)
Telemedicine Encounter- SOAP NOTE Established Patient  This telephone encounter was conducted with the patient's (or proxy's) verbal consent via audio telecommunications: yes/no: Yes Patient was instructed to have this encounter in a suitably private space; and to only have persons present to whom they give permission to participate. In addition, patient identity was confirmed by use of name plus two identifiers (DOB and address).  I discussed the limitations, risks, security and privacy concerns of performing an evaluation and management service by telephone and the availability of in person appointments. I also discussed with the patient that there may be a patient responsible charge related to this service. The patient expressed understanding and agreed to proceed.  I spent a total of 22 minutes talking with the patient or their proxy.  Patient at home Provider in office  Participants: Kathrin Ruddy, NP and Angelina Pih  Chief Complaint  Patient presents with   Follow-up    Patient states she needs a medication refill and discuss still having some hives.    Subjective   Vicki [name] is a 55 y.o. established patient. Telephone visit today for med refill, hives  HPI Med Refills Needs refills on requip, meclizine, prazosin, phenergan, and requip Good effect form each. Taking prazosin and requip daily, others prn No AE. Good therapeutic effect.   Hives Partially resolved, but never entirely Still intermittent without clear trigger. Have been fairly severe in past.  No word from allergy doc or derm as to what could be causing this per pt  [place] no concerns at this time.  Patient Active Problem List   Diagnosis Date Noted   Aortic atherosclerosis ([place]) 10/28/2020   C. difficile colitis 10/19/2020   Diverticulitis 10/18/2020   Chronic urticaria 10/07/2020   Multiple drug allergies 10/07/2020   Anterior cruciate ligament complete tear 08/31/2020    Preop pulmonary/respiratory exam 07/28/2020   Polyp of transverse colon    Colon cancer screening    Generalized abdominal pain    Nausea and vomiting    Mucosal abnormality of rectum    Elevated triglycerides with high cholesterol 07/14/2019   RLS (restless legs syndrome) 07/14/2019   Tobacco use disorder 07/14/2019   Essential hypertension, benign 07/14/2019   Generalized anxiety disorder 07/14/2019   CAP (community acquired pneumonia) 04/07/2019   UTI (urinary tract infection) 04/07/2019   SIRS (systemic inflammatory response syndrome) ([place]) 04/07/2019   Suspected COVID-19 virus infection 04/07/2019   Viral gastroenteritis 04/07/2019   Fatigue 11/07/2018   Depression 11/07/2018   Insomnia 11/07/2018   Polypharmacy 07/19/2017   Asthma    Carpal tunnel syndrome    COPD (chronic obstructive pulmonary disease) ([place])    DDD (degenerative disc disease), lumbar 10/22/2016    Past Medical History:  Diagnosis Date   Allergy    seasonal allergies   Anxiety    on meds   Arthritis    hands   Asthma    childhood-current   Carpal tunnel syndrome    had bilateral sx to tx   Cataract    bilateral--no sx yet   COPD (chronic obstructive pulmonary disease) ([place])    Depression    on meds   Diverticulitis    Emphysema of lung ([place])    uses MDI   Essential hypertension, benign 07/14/2019   on meds   Hyperlipidemia    on meds   Oxygen deficiency    home O2 at 2.5L/[place] nightly   Oxygen dependent    uses O2 at 2.5L/[place] nightly  Current Outpatient Medications  Medication Sig Dispense Refill   acetaminophen (TYLENOL) 500 MG tablet Take 1,000 mg by mouth every 6 (six) hours as needed for moderate pain.     albuterol (VENTOLIN HFA) 108 (90 Base) MCG/ACT inhaler Inhale 2 puffs into the lungs 2 (two) times daily. 18 g 11   ALPRAZolam (XANAX) 1 MG tablet Take 1 tablet (1 mg total) by mouth 3 (three) times daily as needed for anxiety. 120 tablet 0   amLODipine (NORVASC) 5 MG tablet TAKE  1 TABLET (5 MG TOTAL) BY MOUTH DAILY. 90 tablet 1   aspirin EC 81 MG tablet Take 1 tablet (81 mg total) by mouth daily.     azelastine (ASTELIN) 0.1 % nasal spray Place 1 spray into both nostrils 2 (two) times daily. Use in each nostril as directed 30 mL 12   cetirizine (ZYRTEC) 10 MG tablet Take 1 tablet (10 mg total) by mouth daily. 30 tablet 11   citalopram (CELEXA) 20 MG tablet Take 3 tablets (60 mg total) by mouth daily. 270 tablet 2   eletriptan (RELPAX) 40 MG tablet TAKE 1 TABLET AT ONSET OF MIGRAINE OR HEADACHE AS NEEDED. MAY REPEAT IN 2 HOURS IF SYMPTOMS PERSIST. 9 tablet 0   famotidine (PEPCID) 20 MG tablet TAKE 1 TABLET BY MOUTH TWICE A DAY (MORNING AND NIGHT) 60 tablet 1   fexofenadine (ALLEGRA) 180 MG tablet Take 1 tablet (180 mg total) by mouth daily.     Fluticasone-Salmeterol (ADVAIR DISKUS) 500-50 MCG/DOSE AEPB Inhale 1 puff into the lungs in the morning and at bedtime. 60 each 11   gabapentin (NEURONTIN) 300 MG capsule Take 1 capsule (300 mg total) by mouth 2 (two) times daily. 180 capsule 3   HYDROcodone-acetaminophen (NORCO) 7.5-325 MG tablet Take 1 tablet by mouth every 4 (four) hours as needed for moderate pain. 40 tablet 0   hydrOXYzine (ATARAX/VISTARIL) 25 MG tablet Take 0.5-1 tablets (12.5-25 mg total) by mouth 3 (three) times daily as needed. 30 tablet 0   meclizine (ANTIVERT) 12.5 MG tablet Take 1 tablet (12.5 mg total) by mouth 3 (three) times daily as needed for dizziness or nausea. 30 tablet 0   Menthol, Topical Analgesic, (BIOFREEZE EX) Apply 1 application topically daily as needed (back pain).     montelukast (SINGULAIR) 10 MG tablet Take 1 tablet (10 mg total) by mouth at bedtime. 30 tablet 3   omeprazole (PRILOSEC) 20 MG capsule Take 1 capsule (20 mg total) by mouth 2 (two) times daily. Take 1 capsule (20 mg total) by mouth 2 (two) times daily. 180 capsule 1   ondansetron (ZOFRAN) 8 MG tablet Take 8 mg by mouth every 8 (eight) hours as needed for nausea or vomiting.      prazosin (MINIPRESS) 1 MG capsule TAKE 1 TO 2 CAPSULES (1 TO 2 MG TOTAL) BY MOUTH AT BEDTIME. 180 capsule 1   promethazine (PHENERGAN) 25 MG tablet Take 1 tablet (25 mg total) by mouth daily. 60 tablet 0   rOPINIRole (REQUIP) 2 MG tablet Take 0.5 tablets (1 mg total) by mouth at bedtime. 270 tablet 0   traZODone (DESYREL) 50 MG tablet Take 1 tablet (50 mg total) by mouth at bedtime.     triamcinolone cream (KENALOG) 0.1 % Apply 1 application topically 2 (two) times daily. 30 g 0   umeclidinium bromide (INCRUSE ELLIPTA) 62.5 MCG/INH AEPB Inhale 1 puff into the lungs daily.     No current facility-administered medications for this visit.    Allergies  Allergen Reactions   Jasmine Oil Shortness Of Breath and Swelling   Levaquin [Levofloxacin] Shortness Of Breath, Itching, Swelling and Rash   Penicillins Anaphylaxis and Hives    Tolerated ceftriaxone   Aleve [Naproxen Sodium] Itching   Ceftin [Cefuroxime Axetil] Other (See Comments)    unsure   Depo-Provera [Medroxyprogesterone Acetate] Other (See Comments)    Severe acne   Doxycycline Other (See Comments)    Raises blood pressure    Oxycodone Nausea And Vomiting   Tramadol Other (See Comments)    Kept her up all night    Social History   Socioeconomic History   Marital status: Married    Spouse name: [name]   Number of children: 3   Years of education: 12th grade   Highest education level: Not on file  Occupational History   Occupation: Airline pilot: [employer]  Tobacco Use   Smoking status: Every Day    Packs/day: 1.00    Years: 34.00    Pack years: 34.00    Types: Cigarettes   Smokeless tobacco: Never  Vaping Use   Vaping Use: Never used  Substance and Sexual Activity   Alcohol use: Yes    Comment: everyday   Drug use: No   Sexual activity: Yes    Birth control/protection: Surgical  Other Topics Concern   Not on file  Social History Narrative   Lives with her husband.   Their 3 children  live independently.   Social Determinants of Health   Financial Resource Strain: Not on file  Food Insecurity: Not on file  Transportation Needs: Not on file  Physical Activity: Not on file  Stress: Not on file  Social Connections: Not on file  Intimate Partner Violence: Not on file    Review of Systems  Constitutional: Negative.   HENT: Negative.    Eyes: Negative.   Respiratory: Negative.    Cardiovascular: Negative.   Gastrointestinal: Negative.   Genitourinary: Negative.   Musculoskeletal: Negative.   Skin:  Positive for itching and rash.  Neurological: Negative.   Endo/Heme/Allergies: Negative.   Psychiatric/Behavioral: Negative.    All other systems reviewed and are negative.  Objective   Vitals as reported by the patient: There were no vitals filed for this visit.  There are no diagnoses linked to this encounter.  PLAN Refills as above Continue to follow with allergy and derm. Continue to assess anxiety for better control and management. Can consider psychiatry referral if warranted Patient encouraged to call clinic with any questions, comments, or concerns.  I discussed the assessment and treatment plan with the patient. The patient was provided an opportunity to ask questions and all were answered. The patient agreed with the plan and demonstrated an understanding of the instructions.   The patient was advised to call back or seek an in-person evaluation if the symptoms worsen or if the condition fails to improve as anticipated.  I provided 22 minutes of non-face-to-face time during this encounter.  Maximiano Coss, NP

## 2020-12-27 NOTE — Patient Instructions (Signed)
° ° ° °  If you have lab work done today you will be contacted with your lab results within the next 2 weeks.  If you have not heard from us then please contact us. The fastest way to get your results is to register for My Chart. ° ° °IF you received an x-ray today, you will receive an invoice from [facility]. Please contact [facility] at [phone] with questions or concerns regarding your invoice.  ° °IF you received labwork today, you will receive an invoice from [facility]. Please contact [facility] at [phone] with questions or concerns regarding your invoice.  ° °Our billing staff will not be able to assist you with questions regarding bills from these companies. ° °You will be contacted with the lab results as soon as they are available. The fastest way to get your results is to activate your My Chart account. Instructions are located on the last page of this paperwork. If you have not heard from us regarding the results in 2 weeks, please contact this office. °  ° ° ° °

## 2020-12-31 ENCOUNTER — Other Ambulatory Visit: Payer: Self-pay | Admitting: Allergy & Immunology

## 2021-01-11 ENCOUNTER — Other Ambulatory Visit: Payer: Self-pay

## 2021-01-11 ENCOUNTER — Telehealth: Payer: Self-pay

## 2021-01-11 DIAGNOSIS — G43909 Migraine, unspecified, not intractable, without status migrainosus: Secondary | ICD-10-CM

## 2021-01-11 DIAGNOSIS — L270 Generalized skin eruption due to drugs and medicaments taken internally: Secondary | ICD-10-CM

## 2021-01-11 DIAGNOSIS — K219 Gastro-esophageal reflux disease without esophagitis: Secondary | ICD-10-CM

## 2021-01-11 MED ORDER — OMEPRAZOLE 20 MG PO CPDR: 20.0000 mg | DELAYED_RELEASE_CAPSULE | Freq: Two times a day (BID) | ORAL | 1 refills | Status: DC

## 2021-01-11 NOTE — Telephone Encounter (Signed)
Pt needs refill ondansetron (ZOFRAN) 8 MG tablet pt is wanting to know if she can get refills added when sent so she does not have to call every month. omeprazole (PRILOSEC) 20 MG capsule  hydrOXYzine (ATARAX/VISTARIL) 25 MG tablet  eletriptan (RELPAX) 40 MG tablet    [id number] - [address]  Pt call back [phone]

## 2021-01-11 NOTE — Telephone Encounter (Signed)
Pt needs refill ondansetron (ZOFRAN) 8 MG tablet pt is wanting to know if she can get refills added when sent so she does not have to call every month. omeprazole (PRILOSEC) 20 MG capsule  hydrOXYzine (ATARAX/VISTARIL) 25 MG tablet  eletriptan (RELPAX) 40 MG tablet      [id number] [place] [place] - [address] [place] historical provider Hydroxyzine LFD 09/14/20 #30 with no refills Relpax LFD 12/16/20 #9 with no refills

## 2021-01-11 NOTE — Telephone Encounter (Signed)
Request sent to provider for approval.  

## 2021-01-12 ENCOUNTER — Other Ambulatory Visit: Payer: Self-pay | Admitting: Family Medicine

## 2021-01-12 DIAGNOSIS — G43909 Migraine, unspecified, not intractable, without status migrainosus: Secondary | ICD-10-CM

## 2021-01-12 MED ORDER — ONDANSETRON HCL 8 MG PO TABS: 8.0000 mg | ORAL_TABLET | Freq: Three times a day (TID) | ORAL | 5 refills | Status: DC | PRN

## 2021-01-12 MED ORDER — ELETRIPTAN HYDROBROMIDE 40 MG PO TABS: ORAL_TABLET | ORAL | 0 refills | Status: DC

## 2021-01-12 MED ORDER — HYDROXYZINE HCL 25 MG PO TABS: 12.5000 mg | ORAL_TABLET | Freq: Three times a day (TID) | ORAL | 0 refills | Status: DC | PRN

## 2021-01-19 ENCOUNTER — Other Ambulatory Visit: Payer: Self-pay | Admitting: Registered Nurse

## 2021-01-19 DIAGNOSIS — L299 Pruritus, unspecified: Secondary | ICD-10-CM

## 2021-01-24 ENCOUNTER — Ambulatory Visit: Payer: Self-pay | Admitting: Allergy & Immunology

## 2021-01-24 DIAGNOSIS — J309 Allergic rhinitis, unspecified: Secondary | ICD-10-CM

## 2021-01-31 ENCOUNTER — Other Ambulatory Visit: Payer: Self-pay

## 2021-01-31 DIAGNOSIS — F32A Depression, unspecified: Secondary | ICD-10-CM

## 2021-01-31 MED ORDER — ALPRAZOLAM 1 MG PO TABS: 1.0000 mg | ORAL_TABLET | Freq: Three times a day (TID) | ORAL | 0 refills | Status: DC | PRN

## 2021-01-31 NOTE — Telephone Encounter (Signed)
Requesting: Contract: UDS: Last Visit:12/27/20 Next Visit:n/a Last Refill:10/27/20 120 tabs 0 refills  Please Advise

## 2021-01-31 NOTE — Telephone Encounter (Signed)
Pt needs refill on ALPRAZolam (XANAX) 1 MG tablet  eletriptan (RELPAX) 40 MG tablet   [id number] [place] [place] - [address] AT [place]   Pt contact number [phone]

## 2021-02-15 ENCOUNTER — Other Ambulatory Visit: Payer: Self-pay | Admitting: Registered Nurse

## 2021-02-15 DIAGNOSIS — L508 Other urticaria: Secondary | ICD-10-CM

## 2021-02-20 ENCOUNTER — Other Ambulatory Visit: Payer: Self-pay | Admitting: Registered Nurse

## 2021-02-20 DIAGNOSIS — Z1231 Encounter for screening mammogram for malignant neoplasm of breast: Secondary | ICD-10-CM

## 2021-02-21 ENCOUNTER — Ambulatory Visit: Payer: 59

## 2021-03-02 ENCOUNTER — Other Ambulatory Visit: Payer: Self-pay | Admitting: Registered Nurse

## 2021-03-02 DIAGNOSIS — L508 Other urticaria: Secondary | ICD-10-CM

## 2021-03-17 ENCOUNTER — Ambulatory Visit: Payer: 59

## 2021-03-21 ENCOUNTER — Other Ambulatory Visit: Payer: Self-pay | Admitting: Registered Nurse

## 2021-03-21 DIAGNOSIS — I10 Essential (primary) hypertension: Secondary | ICD-10-CM

## 2021-03-27 ENCOUNTER — Other Ambulatory Visit: Payer: Self-pay | Admitting: Registered Nurse

## 2021-03-27 DIAGNOSIS — I10 Essential (primary) hypertension: Secondary | ICD-10-CM

## 2021-04-07 ENCOUNTER — Other Ambulatory Visit: Payer: Self-pay | Admitting: Registered Nurse

## 2021-04-07 DIAGNOSIS — F32A Depression, unspecified: Secondary | ICD-10-CM

## 2021-04-07 NOTE — Telephone Encounter (Signed)
Patient is requesting a refill of the following medications: Requested Prescriptions   Pending Prescriptions Disp Refills   ALPRAZolam (XANAX) 1 MG tablet [Pharmacy Med Name: ALPRAZOLAM 1 MG TABLET] 120 tablet 0    Sig: TAKE 1 TABLET BY MOUTH 3 TIMES DAILY AS NEEDED FOR ANXIETY.    Date of patient request: 04/07/21 Last office visit: 12/27/20 Date of last refill: 01/31/21 Last refill amount: 120 tab

## 2021-04-25 ENCOUNTER — Encounter (HOSPITAL_COMMUNITY): Payer: Self-pay | Admitting: Emergency Medicine

## 2021-04-25 ENCOUNTER — Encounter: Payer: Self-pay | Admitting: Family

## 2021-04-25 ENCOUNTER — Inpatient Hospital Stay (HOSPITAL_COMMUNITY)
Admission: EM | Admit: 2021-04-25 | Discharge: 2021-05-01 | DRG: 190 | Disposition: A | Discharge status: Home / Self Care | Payer: 59 | Source: Ambulatory Visit | Attending: Internal Medicine | Admitting: Internal Medicine

## 2021-04-25 ENCOUNTER — Telehealth (INDEPENDENT_AMBULATORY_CARE_PROVIDER_SITE_OTHER): Payer: 59 | Admitting: Family

## 2021-04-25 ENCOUNTER — Other Ambulatory Visit: Payer: Self-pay

## 2021-04-25 ENCOUNTER — Telehealth: Payer: Self-pay | Admitting: Registered Nurse

## 2021-04-25 ENCOUNTER — Emergency Department (HOSPITAL_COMMUNITY): Payer: 59

## 2021-04-25 VITALS — Ht 63.0 in | Wt 166.0 lb

## 2021-04-25 DIAGNOSIS — J9622 Acute and chronic respiratory failure with hypercapnia: Secondary | ICD-10-CM | POA: Diagnosis present

## 2021-04-25 DIAGNOSIS — R112 Nausea with vomiting, unspecified: Secondary | ICD-10-CM | POA: Diagnosis present

## 2021-04-25 DIAGNOSIS — G2581 Restless legs syndrome: Secondary | ICD-10-CM | POA: Diagnosis present

## 2021-04-25 DIAGNOSIS — Z880 Allergy status to penicillin: Secondary | ICD-10-CM

## 2021-04-25 DIAGNOSIS — F32A Depression, unspecified: Secondary | ICD-10-CM | POA: Diagnosis present

## 2021-04-25 DIAGNOSIS — E785 Hyperlipidemia, unspecified: Secondary | ICD-10-CM | POA: Diagnosis present

## 2021-04-25 DIAGNOSIS — Z9981 Dependence on supplemental oxygen: Secondary | ICD-10-CM

## 2021-04-25 DIAGNOSIS — J440 Chronic obstructive pulmonary disease with acute lower respiratory infection: Secondary | ICD-10-CM | POA: Diagnosis present

## 2021-04-25 DIAGNOSIS — J9621 Acute and chronic respiratory failure with hypoxia: Secondary | ICD-10-CM | POA: Diagnosis present

## 2021-04-25 DIAGNOSIS — Z881 Allergy status to other antibiotic agents status: Secondary | ICD-10-CM

## 2021-04-25 DIAGNOSIS — J302 Other seasonal allergic rhinitis: Secondary | ICD-10-CM | POA: Diagnosis present

## 2021-04-25 DIAGNOSIS — Z9049 Acquired absence of other specified parts of digestive tract: Secondary | ICD-10-CM

## 2021-04-25 DIAGNOSIS — R197 Diarrhea, unspecified: Secondary | ICD-10-CM

## 2021-04-25 DIAGNOSIS — I700 Atherosclerosis of aorta: Secondary | ICD-10-CM | POA: Diagnosis present

## 2021-04-25 DIAGNOSIS — Z720 Tobacco use: Secondary | ICD-10-CM | POA: Diagnosis present

## 2021-04-25 DIAGNOSIS — F17210 Nicotine dependence, cigarettes, uncomplicated: Secondary | ICD-10-CM | POA: Diagnosis present

## 2021-04-25 DIAGNOSIS — I10 Essential (primary) hypertension: Secondary | ICD-10-CM | POA: Diagnosis present

## 2021-04-25 DIAGNOSIS — J189 Pneumonia, unspecified organism: Secondary | ICD-10-CM | POA: Diagnosis present

## 2021-04-25 DIAGNOSIS — F411 Generalized anxiety disorder: Secondary | ICD-10-CM | POA: Diagnosis present

## 2021-04-25 DIAGNOSIS — R0602 Shortness of breath: Secondary | ICD-10-CM

## 2021-04-25 DIAGNOSIS — F419 Anxiety disorder, unspecified: Secondary | ICD-10-CM | POA: Diagnosis present

## 2021-04-25 DIAGNOSIS — R6510 Systemic inflammatory response syndrome (SIRS) of non-infectious origin without acute organ dysfunction: Secondary | ICD-10-CM | POA: Diagnosis present

## 2021-04-25 DIAGNOSIS — Z20822 Contact with and (suspected) exposure to covid-19: Secondary | ICD-10-CM | POA: Diagnosis present

## 2021-04-25 DIAGNOSIS — R1030 Lower abdominal pain, unspecified: Secondary | ICD-10-CM | POA: Diagnosis present

## 2021-04-25 DIAGNOSIS — Z8616 Personal history of COVID-19: Secondary | ICD-10-CM

## 2021-04-25 DIAGNOSIS — Z8619 Personal history of other infectious and parasitic diseases: Secondary | ICD-10-CM

## 2021-04-25 DIAGNOSIS — E860 Dehydration: Secondary | ICD-10-CM | POA: Diagnosis present

## 2021-04-25 DIAGNOSIS — R338 Other retention of urine: Secondary | ICD-10-CM | POA: Diagnosis present

## 2021-04-25 DIAGNOSIS — Z6829 Body mass index (BMI) 29.0-29.9, adult: Secondary | ICD-10-CM

## 2021-04-25 DIAGNOSIS — Z885 Allergy status to narcotic agent status: Secondary | ICD-10-CM

## 2021-04-25 DIAGNOSIS — J441 Chronic obstructive pulmonary disease with (acute) exacerbation: Secondary | ICD-10-CM | POA: Diagnosis present

## 2021-04-25 DIAGNOSIS — M1990 Unspecified osteoarthritis, unspecified site: Secondary | ICD-10-CM | POA: Diagnosis present

## 2021-04-25 DIAGNOSIS — Z90710 Acquired absence of both cervix and uterus: Secondary | ICD-10-CM

## 2021-04-25 DIAGNOSIS — Z716 Tobacco abuse counseling: Secondary | ICD-10-CM

## 2021-04-25 DIAGNOSIS — Z7951 Long term (current) use of inhaled steroids: Secondary | ICD-10-CM

## 2021-04-25 DIAGNOSIS — J449 Chronic obstructive pulmonary disease, unspecified: Secondary | ICD-10-CM

## 2021-04-25 DIAGNOSIS — Z79899 Other long term (current) drug therapy: Secondary | ICD-10-CM

## 2021-04-25 DIAGNOSIS — Z801 Family history of malignant neoplasm of trachea, bronchus and lung: Secondary | ICD-10-CM

## 2021-04-25 DIAGNOSIS — Z79891 Long term (current) use of opiate analgesic: Secondary | ICD-10-CM

## 2021-04-25 DIAGNOSIS — G4700 Insomnia, unspecified: Secondary | ICD-10-CM | POA: Diagnosis present

## 2021-04-25 DIAGNOSIS — N179 Acute kidney failure, unspecified: Secondary | ICD-10-CM | POA: Diagnosis present

## 2021-04-25 DIAGNOSIS — Z888 Allergy status to other drugs, medicaments and biological substances status: Secondary | ICD-10-CM

## 2021-04-25 DIAGNOSIS — E669 Obesity, unspecified: Secondary | ICD-10-CM | POA: Diagnosis present

## 2021-04-25 DIAGNOSIS — J9611 Chronic respiratory failure with hypoxia: Secondary | ICD-10-CM | POA: Diagnosis present

## 2021-04-25 DIAGNOSIS — E872 Acidosis, unspecified: Secondary | ICD-10-CM | POA: Diagnosis present

## 2021-04-25 DIAGNOSIS — E876 Hypokalemia: Secondary | ICD-10-CM | POA: Diagnosis present

## 2021-04-25 DIAGNOSIS — F17200 Nicotine dependence, unspecified, uncomplicated: Secondary | ICD-10-CM

## 2021-04-25 DIAGNOSIS — J9610 Chronic respiratory failure, unspecified whether with hypoxia or hypercapnia: Secondary | ICD-10-CM | POA: Diagnosis present

## 2021-04-25 DIAGNOSIS — Z7982 Long term (current) use of aspirin: Secondary | ICD-10-CM

## 2021-04-25 LAB — COMPREHENSIVE METABOLIC PANEL
ALT: 32 U/L (ref 0–44)
AST: 36 U/L (ref 15–41)
Albumin: 3.3 g/dL — ABNORMAL LOW (ref 3.5–5.0)
Alkaline Phosphatase: 153 U/L — ABNORMAL HIGH (ref 38–126)
Anion gap: 13 (ref 5–15)
BUN: 18 mg/dL (ref 6–20)
CO2: 30 mmol/L (ref 22–32)
Calcium: 8.6 mg/dL — ABNORMAL LOW (ref 8.9–10.3)
Chloride: 93 mmol/L — ABNORMAL LOW (ref 98–111)
Creatinine, Ser: 1.1 mg/dL — ABNORMAL HIGH (ref 0.44–1.00)
GFR, Estimated: >60 mL/min (ref >60)
Glucose, Bld: 96 mg/dL (ref 70–99)
Potassium: 3.5 mmol/L (ref 3.5–5.1)
Sodium: 136 mmol/L (ref 135–145)
Total Bilirubin: 0.9 mg/dL (ref 0.3–1.2)
Total Protein: 7.2 g/dL (ref 6.5–8.1)

## 2021-04-25 LAB — CBC
HCT: 40.8 % (ref 36.0–46.0)
Hemoglobin: 13.3 g/dL (ref 12.0–15.0)
MCH: 31.8 pg (ref 26.0–34.0)
MCHC: 32.6 g/dL (ref 30.0–36.0)
MCV: 97.6 fL (ref 80.0–100.0)
Platelets: 296 10*3/uL (ref 150–400)
RBC: 4.18 MIL/uL (ref 3.87–5.11)
RDW: 14.6 % (ref 11.5–15.5)
WBC: 10.7 10*3/uL — ABNORMAL HIGH (ref 4.0–10.5)
nRBC: 0 % (ref 0.0–0.2)

## 2021-04-25 LAB — BRAIN NATRIURETIC PEPTIDE: B Natriuretic Peptide: 34.3 pg/mL (ref 0.0–100.0)

## 2021-04-25 LAB — RESP PANEL BY RT-PCR (FLU A&B, COVID) ARPGX2
Influenza A by PCR: NEGATIVE
Influenza B by PCR: NEGATIVE
SARS Coronavirus 2 by RT PCR: NEGATIVE

## 2021-04-25 MED ORDER — SODIUM CHLORIDE 0.9 % IV SOLN
75.0000 mL/h | INTRAVENOUS | Status: DC
  Administered 2021-04-25: 75 mL/h via INTRAVENOUS

## 2021-04-25 MED ORDER — POTASSIUM CHLORIDE CRYS ER 20 MEQ PO TBCR
20.0000 meq | EXTENDED_RELEASE_TABLET | Freq: Once | ORAL | Status: AC
  Administered 2021-04-25: 20 meq via ORAL
  Filled 2021-04-25: qty 1

## 2021-04-25 MED ORDER — HYDROCODONE-ACETAMINOPHEN 5-325 MG PO TABS
1.0000 | ORAL_TABLET | ORAL | Status: DC | PRN
  Administered 2021-04-26 (×2): 1 via ORAL
  Administered 2021-04-27 – 2021-04-30 (×9): 2 via ORAL
  Filled 2021-04-25 (×2): qty 2
  Filled 2021-04-25 (×2): qty 1
  Filled 2021-04-25 (×7): qty 2

## 2021-04-25 MED ORDER — KETOROLAC TROMETHAMINE 15 MG/ML IJ SOLN
15.0000 mg | Freq: Once | INTRAMUSCULAR | Status: AC
  Administered 2021-04-25: 15 mg via INTRAVENOUS
  Filled 2021-04-25: qty 1

## 2021-04-25 MED ORDER — ALBUTEROL SULFATE (2.5 MG/3ML) 0.083% IN NEBU
5.0000 mg | INHALATION_SOLUTION | Freq: Once | RESPIRATORY_TRACT | Status: AC
  Administered 2021-04-25: 5 mg via RESPIRATORY_TRACT
  Filled 2021-04-25: qty 6

## 2021-04-25 MED ORDER — ONDANSETRON HCL 4 MG/2ML IJ SOLN: 4.0000 mg | Freq: Four times a day (QID) | INTRAMUSCULAR | Status: DC | PRN

## 2021-04-25 MED ORDER — MAGNESIUM SULFATE 2 GM/50ML IV SOLN
2.0000 g | Freq: Once | INTRAVENOUS | Status: AC
  Administered 2021-04-25: 2 g via INTRAVENOUS
  Filled 2021-04-25: qty 50

## 2021-04-25 MED ORDER — METHYLPREDNISOLONE SODIUM SUCC 125 MG IJ SOLR
80.0000 mg | INTRAMUSCULAR | Status: AC
  Administered 2021-04-26: 06:00:00 80 mg via INTRAVENOUS
  Filled 2021-04-25: qty 2

## 2021-04-25 MED ORDER — PREDNISONE 20 MG PO TABS: 40.0000 mg | ORAL_TABLET | Freq: Every day | ORAL | Status: DC

## 2021-04-25 MED ORDER — IPRATROPIUM-ALBUTEROL 0.5-2.5 (3) MG/3ML IN SOLN
3.0000 mL | Freq: Four times a day (QID) | RESPIRATORY_TRACT | Status: DC
  Administered 2021-04-26: 03:00:00 3 mL via RESPIRATORY_TRACT
  Filled 2021-04-25: qty 3

## 2021-04-25 MED ORDER — ACETAMINOPHEN 650 MG RE SUPP: 650.0000 mg | Freq: Four times a day (QID) | RECTAL | Status: DC | PRN

## 2021-04-25 MED ORDER — ONDANSETRON HCL 4 MG PO TABS: 4.0000 mg | ORAL_TABLET | Freq: Four times a day (QID) | ORAL | Status: DC | PRN

## 2021-04-25 MED ORDER — ACETAMINOPHEN 325 MG PO TABS
650.0000 mg | ORAL_TABLET | Freq: Four times a day (QID) | ORAL | Status: DC | PRN
  Administered 2021-05-01: 08:00:00 650 mg via ORAL
  Filled 2021-04-25: qty 2

## 2021-04-25 MED ORDER — ALBUTEROL SULFATE (2.5 MG/3ML) 0.083% IN NEBU: 2.5000 mg | INHALATION_SOLUTION | RESPIRATORY_TRACT | Status: DC | PRN

## 2021-04-25 MED ORDER — ENOXAPARIN SODIUM 30 MG/0.3ML IJ SOSY: 30.0000 mg | PREFILLED_SYRINGE | INTRAMUSCULAR | Status: DC

## 2021-04-25 MED ORDER — ALBUTEROL SULFATE (2.5 MG/3ML) 0.083% IN NEBU
12.0000 mL | INHALATION_SOLUTION | Freq: Once | RESPIRATORY_TRACT | Status: AC
  Administered 2021-04-25: 12 mL via RESPIRATORY_TRACT
  Filled 2021-04-25: qty 12

## 2021-04-25 MED ORDER — AMLODIPINE BESYLATE 5 MG PO TABS: 5.0000 mg | ORAL_TABLET | Freq: Every day | ORAL | Status: DC

## 2021-04-25 MED ORDER — IPRATROPIUM-ALBUTEROL 0.5-2.5 (3) MG/3ML IN SOLN
3.0000 mL | Freq: Once | RESPIRATORY_TRACT | Status: AC
  Administered 2021-04-25: 3 mL via RESPIRATORY_TRACT
  Filled 2021-04-25: qty 3

## 2021-04-25 MED ORDER — METHYLPREDNISOLONE SODIUM SUCC 40 MG IJ SOLR: 40.0000 mg | Freq: Two times a day (BID) | INTRAMUSCULAR | Status: DC

## 2021-04-25 MED ORDER — CITALOPRAM HYDROBROMIDE 40 MG PO TABS
60.0000 mg | ORAL_TABLET | Freq: Every day | ORAL | Status: DC
  Administered 2021-04-26 – 2021-04-30 (×5): 60 mg via ORAL
  Filled 2021-04-25: qty 6
  Filled 2021-04-25 (×4): qty 1

## 2021-04-25 MED ORDER — ALPRAZOLAM 0.25 MG PO TABS: 1.0000 mg | ORAL_TABLET | Freq: Every evening | ORAL | Status: DC | PRN

## 2021-04-25 MED ORDER — ROPINIROLE HCL 1 MG PO TABS
1.0000 mg | ORAL_TABLET | Freq: Every day | ORAL | Status: DC
Start: 2021-04-25 — End: 2021-05-01
  Administered 2021-04-26 – 2021-04-30 (×6): 2 mg via ORAL
  Filled 2021-04-25 (×6): qty 2

## 2021-04-25 MED ORDER — METHYLPREDNISOLONE SODIUM SUCC 125 MG IJ SOLR
125.0000 mg | Freq: Once | INTRAMUSCULAR | Status: AC
  Administered 2021-04-25: 125 mg via INTRAVENOUS
  Filled 2021-04-25: qty 2

## 2021-04-25 MED ORDER — GABAPENTIN 300 MG PO CAPS
300.0000 mg | ORAL_CAPSULE | Freq: Two times a day (BID) | ORAL | Status: DC
  Administered 2021-04-26 – 2021-04-30 (×11): 300 mg via ORAL
  Filled 2021-04-25 (×11): qty 1

## 2021-04-25 MED ORDER — MONTELUKAST SODIUM 10 MG PO TABS
10.0000 mg | ORAL_TABLET | Freq: Every day | ORAL | Status: DC
  Administered 2021-04-26 – 2021-04-30 (×6): 10 mg via ORAL
  Filled 2021-04-25 (×8): qty 1

## 2021-04-25 MED ORDER — PRAZOSIN HCL 1 MG PO CAPS
1.0000 mg | ORAL_CAPSULE | Freq: Every day | ORAL | Status: DC
Start: 2021-04-26 — End: 2021-04-26
  Filled 2021-04-25: qty 2

## 2021-04-25 NOTE — Assessment & Plan Note (Signed)
Cr at baseline 0.6 up to 1.1 today , -  evidence of acute renal failure due to presence of following: Cr increased >0.3 from baseline  likely secondary to dehydration,       check FeNA       Rehydrate with IV fluids   History does not suggest urinary retention or obstruction

## 2021-04-25 NOTE — Assessment & Plan Note (Addendum)
-  -   Will initiate: Steroid taper  - tolerated rocephin in the past will order - Albuterol  PRN, and cont nebs - scheduled duoneb,    -  Mucinex. Desym Titrate O2 to saturation >90%. Follow patients respiratory status.   nfluenza PCR negative   VBG initially WNL but on repeat showing worsening hypercarbia, pt reports feeling sleepy Ordered BIPAP RT aware  Treat cough to improve toleration of biPAP Discussed with PCCM will see in am re consult if need to see sooner

## 2021-04-25 NOTE — ED Provider Notes (Signed)
Emergency Medicine Provider Triage Evaluation Note  Vicki [name] , a 55 y.o. female  was evaluated in triage.  Pt complains of shortness of breath, hypoxia x3 days.  Endorses cough, denies fever abdominal pain, vomiting.  Wears home O2 at night.  Reports the past 3 days she has been wearing her oxygen constantly.   Review of Systems  Positive: As above Negative: As above  Physical Exam  BP 126/77 (BP Location: Right Arm)    Pulse (!) 110    Temp 98.7 F (37.1 C) (Oral)    Resp (!) 24    SpO2 94%  Gen:   Awake, no distress   Resp:  Wheezing and coarse lung sounds throughout MSK:   Moves extremities without difficulty  Other:    Medical Decision Making  Medically screening exam initiated at 7:10 PM.  Appropriate orders placed.  Dajon Rowe was informed that the remainder of the evaluation will be completed by another provider, this initial triage assessment does not replace that evaluation, and the importance of remaining in the ED until their evaluation is complete.     Evlyn Courier, PA-C 04/25/21 1911    Lacretia Leigh, MD 04/28/21 [phone]

## 2021-04-25 NOTE — ED Triage Notes (Signed)
Patient c/o headache,cough, diarrhea and shortness of breath. Wears 2.5 L [place] at night. States over the past few days turning up oxygen to 3.5-4L [place] and wearing during the day. C/o lower abdominal pain since Saturday.

## 2021-04-25 NOTE — Telephone Encounter (Signed)
Refill eletriptan, short script last fill please advise if refill appropriate

## 2021-04-25 NOTE — Progress Notes (Signed)
Virtual Visit via Telephone Note  I connected with Vicki [name] on 04/25/21 at  3:30 PM EST by telephone and verified that I am speaking with the correct person using two identifiers.  Location: Patient: Home Provider: [place]   I discussed the limitations, risks, security and privacy concerns of performing an evaluation and management service by telephone and the availability of in person appointments. I also discussed with the patient that there may be a patient responsible charge related to this service. The patient expressed understanding and agreed to proceed.   History of Present Illness: 55 year old female presents via telephone visit with c/o fever, cough, congestion, headaches, wheezing, diarrhea, x 4 days and worsening. She is oxygen dependent on 2 liters normally but is on 4.5 liter recently due to SOB. She has a h/o pneumonia requiring hospitalization. Not eating or drinking well.     Observations/Objective: A&O, Wheezing heard through the phone, obviously winded.    Assessment and Plan: [name] was seen today for acute home visit.  Diagnoses and all orders for this visit:  Shortness of breath  Oxygen dependent  Diarrhea, unspecified type     Follow Up Instructions: Patient advised to go to the ED. Husband will transport her to [facility] ED. Report called to the charge nurse.     I discussed the assessment and treatment plan with the patient. The patient was provided an opportunity to ask questions and all were answered. The patient agreed with the plan and demonstrated an understanding of the instructions.   The patient was advised to call back or seek an in-person evaluation if the symptoms worsen or if the condition fails to improve as anticipated.  I provided 25 minutes of non-face-to-face time during this encounter.   Kennyth Arnold, FNP

## 2021-04-25 NOTE — Assessment & Plan Note (Signed)
Continue Norvasc

## 2021-04-25 NOTE — H&P (Signed)
Vicki [name] AGT:[id number] DOB: 04/29/1966 DOA: 04/25/2021   PCP: Maximiano Coss, NP   Outpatient Specialists:     Pulmonary  does not recall the name    Patient arrived to ER on 04/25/21 at 1632 Referred by Attending Truddie Hidden, MD   Patient coming from: home Lives With family    Chief Complaint:   Chief Complaint  Patient presents with   Shortness of Breath   Cough    HPI: Vicki [name] is a 55 y.o. female with medical history significant of COPD on chronic O2 2L, HTN,HLD    Presented with  fever, cough, congestion, headaches, wheezing, diarrhea, x 4 days     Had to increased her O2 to 4.5 L has been very SOB Wheezing, cough fever 100.4  Reports abd pain in the lower abd Reports intermittent nausea and vomiting and diarrhea similar to prior diverticulitis This started 4 days ago  Has been using her inhalers more  No sick contacts Still continues to smoke Denies any CP Increased cough productive of green sputum Does not drink  Has  been vaccinated against COVID  and boosted had  flu shot   Initial COVID TEST  NEGATIVE   Lab Results  Component Value Date   SARSCOV2NAA NEGATIVE 04/25/2021   [place] NEGATIVE 10/18/2020   [place] NEGATIVE 08/29/2020   [place] NEGATIVE 08/23/2020     Regarding pertinent Chronic problems:     Hyperlipidemia - not on statins Lipid Panel     Component Value Date/Time   CHOL 207 (H) 07/14/2019 1458   TRIG 274 (H) 07/14/2019 1458   HDL 66 07/14/2019 1458   CHOLHDL 3.1 07/14/2019 1458   LDLCALC 96 07/14/2019 1458   LABVLDL 45 (H) 07/14/2019 1458          COPD -  followed by pulmonology    on baseline oxygen  *L,      While in ER: CXR neg  On cont nebs  Mag and solumedrol Vitals  labs and radiology finding personally reviewed    ED Triage Vitals  Enc Vitals Group     BP 04/25/21 1901 126/77     Pulse Rate 04/25/21 1901 (!) 110     Resp 04/25/21 1901 (!) 24     Temp  04/25/21 1901 98.7 F (37.1 C)     Temp Source 04/25/21 1901 Oral     SpO2 04/25/21 1901 94 %     Weight --      Height --      Head Circumference --      Peak Flow --      Pain Score 04/25/21 1856 7     Pain Loc --      Pain Edu? --      Excl. in [place]? --   TMAX(24)@     _________________________________________ Significant initial  Findings: Abnormal Labs Reviewed  CBC - Abnormal; Notable for the following components:      Result Value   WBC 10.7 (*)    All other components within normal limits  COMPREHENSIVE METABOLIC PANEL - Abnormal; Notable for the following components:   Chloride 93 (*)    Creatinine, Ser 1.10 (*)    Calcium 8.6 (*)    Albumin 3.3 (*)    Alkaline Phosphatase 153 (*)    All other components within normal limits   ____________________________________________   CXR -  NON acute  CTabd/pelvis -  Ordered     __  ECG: Ordered  Personally reviewed by me showing: HR : 103 Rhythm:   Sinus tachycardia    no evidence of ischemic changes QTC 432  The recent clinical data is shown below. Vitals:   04/25/21 2005 04/25/21 2030 04/25/21 2100 04/25/21 2126  BP: 109/84 (!) 134/91 95/69   Pulse: (!) 118 (!) 136 (!) 110   Resp: (!) 21 (!) 24 18   Temp:      TempSrc:      SpO2: 93% (!) 89% 90% 94%    WBC     Component Value Date/Time   WBC 10.7 (H) 04/25/2021 1946   LYMPHSABS 1.8 11/11/2020 1326   LYMPHSABS 5.0 (H) 10/07/2020 0925   MONOABS 0.9 11/11/2020 1326   EOSABS 0.2 11/11/2020 1326   EOSABS 0.1 10/07/2020 0925   BASOSABS 0.0 11/11/2020 1326   BASOSABS 0.0 10/07/2020 0925       UA  ordered    Results for orders placed or performed during the hospital encounter of 04/25/21  Resp Panel by RT-PCR (Flu A&B, Covid) Nasopharyngeal Swab     Status: None   Collection Time: 04/25/21  7:10 PM   Specimen: Nasopharyngeal Swab; Nasopharyngeal(NP) swabs in vial transport medium  Result Value Ref Range Status   SARS Coronavirus 2 by RT PCR NEGATIVE  NEGATIVE Final         Influenza A by PCR NEGATIVE NEGATIVE Final   Influenza B by PCR NEGATIVE NEGATIVE Final           _______________________________________________ Hospitalist was called for admission for  COPD exacebation  The following Work up has been ordered so far:  Orders Placed This Encounter  Procedures   Resp Panel by RT-PCR (Flu A&B, Covid) Nasopharyngeal Swab   DG Chest Portable 1 View   CBC   Comprehensive metabolic panel   Brain natriuretic peptide   Patient may eat/drink   Consult to hospitalist   EKG 12-Lead     Following Medications were ordered in ER: Medications  ipratropium-albuterol (DUONEB) 0.5-2.5 (3) MG/3ML nebulizer solution 3 mL (3 mLs Nebulization Given 04/25/21 1935)  methylPREDNISolone sodium succinate (SOLU-MEDROL) 125 mg/2 mL injection 125 mg (125 mg Intravenous Given 04/25/21 2008)  magnesium sulfate IVPB 2 g 50 mL (0 g Intravenous Stopped 04/25/21 2119)  albuterol (PROVENTIL) (2.5 MG/3ML) 0.083% nebulizer solution 5 mg (5 mg Nebulization Given 04/25/21 2006)  ketorolac (TORADOL) 15 MG/ML injection 15 mg (15 mg Intravenous Given 04/25/21 2118)  albuterol (PROVENTIL) (2.5 MG/3ML) 0.083% nebulizer solution 12 mL (12 mLs Nebulization Given 04/25/21 2125)        Consult Orders  (From admission, onward)           Start     Ordered   04/25/21 2142  Consult to hospitalist  Paged by Nelva Bush  Once       Provider:  (Not yet assigned)  Question Answer Comment  Place call to: [facility]   Reason for Consult Admit      04/25/21 2141              OTHER Significant initial  Findings:  labs showing:    Recent Labs  Lab 04/25/21 1946  NA 136  K 3.5  CO2 30  GLUCOSE 96  BUN 18  CREATININE 1.10*  CALCIUM 8.6*    Cr Up from baseline see below Lab Results  Component Value Date   CREATININE 1.02 (H) 04/26/2021   CREATININE 1.10 (H) 04/25/2021   CREATININE 0.65 10/27/2020    Recent Labs  Lab  04/25/21 1946  AST  36  ALT 32  ALKPHOS 153*  BILITOT 0.9  PROT 7.2  ALBUMIN 3.3*   Lab Results  Component Value Date   CALCIUM 8.6 (L) 04/25/2021   PHOS 2.5 04/08/2019    Plt: Lab Results  Component Value Date   PLT 296 04/25/2021    Venous  Blood Gas result:  pH 7.398  pCO2 44  Repeat VBG 7.324/56.8/216   ( Possibly ABG given elevated pO2)  Recent Labs  Lab 04/25/21 1946  WBC 10.7*  HGB 13.3  HCT 40.8  MCV 97.6  PLT 296    HG/HCT  stable,       Component Value Date/Time   HGB 13.3 04/25/2021 1946   HGB 15.4 10/07/2020 0925   HCT 40.8 04/25/2021 1946   HCT 46.4 10/07/2020 0925   MCV 97.6 04/25/2021 1946   MCV 95 10/07/2020 0925            Cultures:    Component Value Date/Time   SDES URINE, RANDOM 04/07/2019 1743   SPECREQUEST  04/07/2019 1743    ADDED 0057 04/08/2019 Performed at [facility], [place] [address], [place], [place] [address]    CULT (A) 04/07/2019 1743    >=100,000 COLONIES/mL ESCHERICHIA COLI Confirmed Extended Spectrum Beta-Lactamase Producer (ESBL).  In bloodstream infections from ESBL organisms, carbapenems are preferred over piperacillin/tazobactam. They are shown to have a lower risk of mortality.    REPTSTATUS 04/09/2019 FINAL 04/07/2019 1743     Radiological Exams on Admission: DG Chest Portable 1 View  Result Date: 04/25/2021 CLINICAL DATA:  Short of breath, headache EXAM: PORTABLE CHEST 1 VIEW COMPARISON:  10/18/2020 FINDINGS: Single frontal view of the chest demonstrates an unremarkable cardiac silhouette. Chronic background interstitial scarring without acute airspace disease, effusion, or pneumothorax. No acute bony abnormalities. IMPRESSION: 1. No acute intrathoracic process. Electronically Signed   By: Randa Ngo M.D.   On: 04/25/2021 20:26   _______________________________________________________________________________________________________ Latest  Blood pressure 95/69, pulse (!) 110, temperature 98.7 F (37.1 C), temperature  source Oral, resp. rate 18, SpO2 94 %.   Review of Systems:    Pertinent positives include:  Fevers, chills, fatigue, shortness of breath at rest.   dyspnea on exertion,  excess mucus,   productive cough, abdominal pain, nausea, vomiting, diarrhea,   Constitutional:  No weight loss, night sweats,  weight loss  HEENT:  No headaches, Difficulty swallowing,Tooth/dental problems,Sore throat,  No sneezing, itching, ear ache, nasal congestion, post nasal drip,  Cardio-vascular:  No chest pain, Orthopnea, PND, anasarca, dizziness, palpitations.no Bilateral lower extremity swelling  GI:  No heartburn, indigestion, change in bowel habits, loss of appetite, melena, blood in stool, hematemesis Resp:  no No non-productive cough, No coughing up of blood.No change in color of mucus.No wheezing. Skin:  no rash or lesions. No jaundice GU:  no dysuria, change in color of urine, no urgency or frequency. No straining to urinate.  No flank pain.  Musculoskeletal:  No joint pain or no joint swelling. No decreased range of motion. No back pain.  Psych:  No change in mood or affect. No depression or anxiety. No memory loss.  Neuro: no localizing neurological complaints, no tingling, no weakness, no double vision, no gait abnormality, no slurred speech, no confusion  All systems reviewed and apart from [place] all are negative _______________________________________________________________________________________________ Past Medical History:   Past Medical History:  Diagnosis Date   Allergy    seasonal allergies   Anxiety    on  meds   Arthritis    hands   Asthma    childhood-current   Carpal tunnel syndrome    had bilateral sx to tx   Cataract    bilateral--no sx yet   COPD (chronic obstructive pulmonary disease) (HCC)    Depression    on meds   Diverticulitis    Emphysema of lung ([place])    uses MDI   Essential hypertension, benign 07/14/2019   on meds   Hyperlipidemia    on meds   Oxygen  deficiency    home O2 at 2.5L/[place] nightly   Oxygen dependent    uses O2 at 2.5L/[place] nightly      Past Surgical History:  Procedure Laterality Date   ABDOMINAL HYSTERECTOMY  2009   APPENDECTOMY     ARTHROSCOPIC REPAIR ACL Left 2011/2014   x 2   BIOPSY  01/21/2020   Procedure: BIOPSY;  Surgeon: Mauri Pole, MD;  Location: [facility];  Service: Endoscopy;;  EGD and COLON   BUNIONECTOMY  2006   both   CARPAL TUNNEL RELEASE Bilateral 10/28/2013   Procedure: BILATERAL CARPAL TUNNEL RELEASE;  Surgeon: Wynonia Sours, MD;  Location: [facility];  Service: Orthopedics;  Laterality: Bilateral;   CHOLECYSTECTOMY  1994   CHONDROPLASTY Right 08/31/2020   Procedure: CHONDROPLASTY;  Surgeon: Earlie Server, MD;  Location: [facility];  Service: Orthopedics;  Laterality: Right;   COLONOSCOPY WITH PROPOFOL N/A 01/21/2020   Procedure: COLONOSCOPY WITH PROPOFOL;  Surgeon: Mauri Pole, MD;  Location: [facility];  Service: Endoscopy;  Laterality: N/A;   DILATION AND CURETTAGE OF UTERUS     ESOPHAGOGASTRODUODENOSCOPY (EGD) WITH PROPOFOL N/A 01/21/2020   Procedure: ESOPHAGOGASTRODUODENOSCOPY (EGD) WITH PROPOFOL;  Surgeon: Mauri Pole, MD;  Location: [facility];  Service: Endoscopy;  Laterality: N/A;   KNEE ARTHROSCOPY WITH ANTERIOR CRUCIATE LIGAMENT (ACL) REPAIR Right 08/31/2020   Procedure: RIGHT KNEE ARTHROSCOPY WITH ANTERIOR CRUCIATE LIGAMENT (ACL) REPAIR MEDIAL AND LATERAL  MENISECTOMY;  Surgeon: Earlie Server, MD;  Location: [facility];  Service: Orthopedics;  Laterality: Right;  FEMORAL NERVE BLOCK   POLYPECTOMY  01/21/2020   Procedure: POLYPECTOMY;  Surgeon: Mauri Pole, MD;  Location: [facility];  Service: Endoscopy;;   TUBAL LIGATION  2004    Social History:  Ambulatory   independently     reports that she has been smoking cigarettes. She has a 34.00 pack-year smoking history. She has never used smokeless tobacco. She  reports current alcohol use. She reports that she does not use drugs.   Family History:    Family History  Problem Relation Age of Onset   Brain cancer Mother 36   Lung cancer Mother 48   Breast cancer Sister 20   Ovarian cancer Sister 66   Colon cancer Neg Hx    Colon polyps Neg Hx    Esophageal cancer Neg Hx    Rectal cancer Neg Hx    Stomach cancer Neg Hx    ______________________________________________________________________________________________ Allergies: Allergies  Allergen Reactions   Jasmine Oil Shortness Of Breath and Swelling   Levaquin [Levofloxacin] Shortness Of Breath, Itching, Swelling and Rash   Penicillins Anaphylaxis and Hives    Tolerated ceftriaxone   Aleve [Naproxen Sodium] Itching   Ceftin [Cefuroxime Axetil] Other (See Comments)    unsure   Depo-Provera [Medroxyprogesterone Acetate] Other (See Comments)    Severe acne   Doxycycline Other (See Comments)    Raises blood pressure    Oxycodone Nausea And  Vomiting   Tramadol Other (See Comments)    Kept her up all night     Prior to Admission medications   Medication Sig Start Date End Date Taking? Authorizing Provider  acetaminophen (TYLENOL) 500 MG tablet Take 1,000 mg by mouth every 6 (six) hours as needed for moderate pain.   Yes [provider]  albuterol (VENTOLIN HFA) 108 (90 Base) MCG/ACT inhaler Inhale 2 puffs into the lungs 2 (two) times daily. 06/13/20  Yes Maximiano Coss, NP  ALPRAZolam (XANAX) 1 MG tablet TAKE 1 TABLET BY MOUTH 3 TIMES DAILY AS NEEDED FOR ANXIETY. Patient taking differently: Take 1 mg by mouth 3 (three) times daily as needed for anxiety. 04/10/21  Yes Maximiano Coss, NP  amLODipine (NORVASC) 5 MG tablet TAKE 1 TABLET (5 MG TOTAL) BY MOUTH DAILY. 03/28/21  Yes Maximiano Coss, NP  ASPIRIN LOW DOSE 81 MG EC tablet TAKE 1 TABLET BY MOUTH 2 TIMES DAILY. Patient taking differently: Take 81 mg by mouth in the morning and at bedtime. 03/21/21  Yes Maximiano Coss, NP   azelastine (ASTELIN) 0.1 % nasal spray Place 1 spray into both nostrils 2 (two) times daily. Use in each nostril as directed 10/27/20  Yes Maximiano Coss, NP  cetirizine (ZYRTEC) 10 MG tablet Take 1 tablet (10 mg total) by mouth daily. 09/14/20  Yes Maximiano Coss, NP  citalopram (CELEXA) 20 MG tablet Take 3 tablets (60 mg total) by mouth daily. 10/21/20  Yes Florencia Reasons, MD  eletriptan (RELPAX) 40 MG tablet TAKE 1 TABLET AT ONSET OF MIGRAINE OR HEADACHE AS NEEDED. MAY REPEAT IN 2 HOURS IF SYMPTOMS PERSIST. Patient taking differently: Take 40 mg by mouth as directed. Take 1 tablet at onset of Migraine or Headache as needed. May repeat in 2 hours if symptoms persist. 01/12/21  Yes Maximiano Coss, NP  EPINEPHrine (EPIPEN 2-PAK) 0.3 mg/0.3 mL IJ SOAJ injection Inject 0.3 mg into the muscle as needed for anaphylaxis. 12/27/20  Yes Maximiano Coss, NP  famotidine (PEPCID) 20 MG tablet TAKE 1 TABLET BY MOUTH TWICE A DAY (MORNING AND NIGHT) Patient taking differently: Take 20 mg by mouth 2 (two) times daily at 8 am and 10 pm. 11/07/20  Yes Valentina Shaggy, MD  fexofenadine (ALLEGRA) 180 MG tablet Take 1 tablet (180 mg total) by mouth daily. 10/21/20  Yes Florencia Reasons, MD  gabapentin (NEURONTIN) 300 MG capsule Take 1 capsule (300 mg total) by mouth 2 (two) times daily. 06/13/20  Yes Maximiano Coss, NP  hydrOXYzine (ATARAX/VISTARIL) 25 MG tablet Take 0.5-1 tablets (12.5-25 mg total) by mouth 3 (three) times daily as needed. Patient taking differently: Take 12.5-25 mg by mouth 3 (three) times daily as needed for anxiety. 01/12/21  Yes Maximiano Coss, NP  meclizine (ANTIVERT) 25 MG tablet Take 1 tablet (25 mg total) by mouth 3 (three) times daily as needed for dizziness. 12/27/20  Yes Maximiano Coss, NP  Menthol, Topical Analgesic, (BIOFREEZE EX) Apply 1 application topically daily as needed (back pain).   Yes [provider]  montelukast (SINGULAIR) 10 MG tablet TAKE 1 TABLET BY MOUTH EVERYDAY AT  BEDTIME Patient taking differently: Take 10 mg by mouth at bedtime. 01/20/21  Yes Maximiano Coss, NP  omeprazole (PRILOSEC) 20 MG capsule Take 1 capsule (20 mg total) by mouth 2 (two) times daily. Take 1 capsule (20 mg total) by mouth 2 (two) times daily. 01/11/21 04/25/21 Yes Maximiano Coss, NP  ondansetron (ZOFRAN) 8 MG tablet Take 1 tablet (8 mg total) by mouth every  8 (eight) hours as needed for nausea or vomiting. 01/12/21  Yes Maximiano Coss, NP  prazosin (MINIPRESS) 1 MG capsule TAKE 1 TO 2 CAPSULES (1 TO 2 MG TOTAL) BY MOUTH AT BEDTIME. Patient taking differently: Take 1-2 mg by mouth at bedtime. 12/27/20  Yes Maximiano Coss, NP  promethazine (PHENERGAN) 25 MG tablet Take 1 tablet (25 mg total) by mouth every 8 (eight) hours as needed for nausea or vomiting. 12/27/20  Yes Maximiano Coss, NP  rOPINIRole (REQUIP) 2 MG tablet Take 0.5-1 tablets (1-2 mg total) by mouth at bedtime. 12/27/20  Yes Maximiano Coss, NP  STIOLTO RESPIMAT 2.5-2.5 MCG/ACT AERS Inhale 1 puff into the lungs daily. 04/21/21  Yes [provider]  traZODone (DESYREL) 50 MG tablet Take 1 tablet (50 mg total) by mouth at bedtime. 10/21/20  Yes Florencia Reasons, MD  umeclidinium bromide (INCRUSE ELLIPTA) 62.5 MCG/INH AEPB Inhale 1 puff into the lungs daily. 10/21/20  Yes Florencia Reasons, MD  dapsone 25 MG tablet TAKE 1 TABLET BY MOUTH DAILY FOR 14 DAYS, THEN 2 TABLETS DAILY. Patient not taking: Reported on 04/25/2021 03/02/21   Maximiano Coss, NP  Fluticasone-Salmeterol (ADVAIR DISKUS) 500-50 MCG/DOSE AEPB Inhale 1 puff into the lungs in the morning and at bedtime. Patient not taking: Reported on 04/25/2021 06/13/20   Maximiano Coss, NP  HYDROcodone-acetaminophen (NORCO) 7.5-325 MG tablet Take 1 tablet by mouth every 4 (four) hours as needed for moderate pain. Patient not taking: Reported on 04/25/2021 08/31/20   Chriss Czar, PA-C  triamcinolone cream (KENALOG) 0.1 % Apply 1 application topically 2 (two) times daily. Patient not  taking: Reported on 04/25/2021 09/14/20   Maximiano Coss, NP    ___________________________________________________________________________________________________ Physical Exam: Vitals with BMI 04/25/2021 04/25/2021 04/25/2021  Height - - -  Weight - - -  BMI - - -  Systolic 95 239 532  Diastolic 69 91 84  Pulse 023 136 118     1. General:  in  Acute distress increased work of breathing    Chronically ill -appearing 2. Psychological: Alert and  Oriented 3. Head/ENT:    Dry Mucous Membranes                          Head Non traumatic, neck supple                           Poor Dentition 4. SKIN decreased Skin turgor,  Skin clean Dry and intact no rash 5. Heart: Regular rate and rhythm no  Murmur, no Rub or gallop 6. Lungs:extensive wheezes decreased air movement 7. Abdomen: Soft,   suprapubic left lower quadrant tender, Non distended   obese  bowel sounds present 8. Lower extremities: no clubbing, cyanosis, no  edema 9. Neurologically Grossly intact, moving all 4 extremities equally  10. MSK: Normal range of motion    Chart has been reviewed  ______________________________________________________________________________________________  Assessment/Plan 55 y.o. female with medical history significant of COPD on chronic O2 2L, HTN,HLD  Admitted for COPD exacerbation  Present on Admission:  COPD exacerbation ([place])  Tobacco use disorder  Acute on chronic respiratory failure with hypoxia (HCC)  Depression  Essential hypertension  AKI (acute kidney injury) ([place])  Anxiety  RLS (restless legs syndrome)  SIRS (systemic inflammatory response syndrome) (HCC)  Essential hypertension, benign  Hyperlipidemia  Generalized anxiety disorder  Lower abdominal pain  Nausea vomiting and diarrhea  '@PROGRESSNOTESWSENSITIVE' @ COPD exacerbation ([place])  -  -  Will initiate: Steroid taper  - tolerated rocephin in the past will order - Albuterol  PRN, and cont nebs - scheduled duoneb,     -  Mucinex. Desym Titrate O2 to saturation >90%. Follow patients respiratory status.   nfluenza PCR negative   VBG initially WNL but on repeat showing worsening hypercarbia, pt reports feeling sleepy Ordered BIPAP RT aware  Treat cough to improve toleration of biPAP Discussed with PCCM will see in am re consult if need to see sooner    Essential hypertension Continue Norvasc  AKI (acute kidney injury) ([place]) Cr at baseline 0.6 up to 1.1 today , -  evidence of acute renal failure due to presence of following: Cr increased >0.3 from baseline  likely secondary to dehydration,       check FeNA       Rehydrate with IV fluids   History does not suggest urinary retention or obstruction         Essential hypertension, benign Hold home meds given soft bp  Acute on chronic respiratory failure with hypoxia (HCC)  this patient has acute respiratory failure with Hypoxia and Hypercarbia as documented by the presence of following: O2 saturatio< 90% on RA pH <7.35 with pCO2 >50   Likely due to: COPD exacerbation  Provide O2 therapy and titrate as needed  Continuous pulse ox   check Pulse ox with ambulation prior to discharge  flutter valve ordered   Lower abdominal pain Obtain CT abd/pelvis given abd tenderness, and fever  Hyperlipidemia Check lipid panel  RLS (restless legs syndrome) Cont requip  Tobacco use disorder  - Spoke about importance of quitting spent 5 minutes discussing options for treatment, prior attempts at quitting, and dangers of smoking  -At this point patient is   interested in quitting  - order nicotine patch   - nursing tobacco cessation protocol     Anxiety Given some sedaion hold xanax for today may need to restart when no evidence of somnolence  SIRS (systemic inflammatory response syndrome) ([place])  -SIRS criteria met with  tachycardia   , fever at home  RR >20 Today's Vitals   04/26/21 0215 04/26/21 0230 04/26/21 0245 04/26/21 0300  BP: 106/70 102/77  110/76 110/66  Pulse: 87 98 (!) 110 (!) 111  Resp: 20 (!) '24 17 19  ' Temp:      TempSrc:      SpO2: 97% 95% 97% 96%  PainSc:        -Most likely source being  pulmonary, intra-abdominal, Source of sepsis is unclear but given clinical picture will continue to treat  Patient meeting criteria for Severe sepsis with    evidence of end organ damage/organ dysfunction such as    elevated lactic acid >2     Component Value Date/Time   LATICACIDVEN 2.3 (HH) 04/26/2021 0220   acute hypoxia requiring new supplemental oxygen, SpO2: 96 % O2 Flow Rate (L/min): 4 L/min     - Obtain serial lactic acid and procalcitonin level.  - Initiated IV antibiotics in ER: Antibiotics Given (last 72 hours)     Date/Time Action Medication Dose Rate   04/26/21 0144 New Bag/Given   cefTRIAXone (ROCEPHIN) 2 g in sodium chloride 0.9 % 100 mL IVPB 2 g 200 mL/hr   04/26/21 0237 New Bag/Given   metroNIDAZOLE (FLAGYL) IVPB 500 mg 500 mg 100 mL/hr      Will continue     - await results of blood and urine culture  - Rehydrate y  Intravenous  fluids were administered,    4:09 AM   Nausea vomiting and diarrhea - Order gastric panel and C. difficile testing CT abdomen to evaluate for any evidence of colitis.  Given SIRS empirically covered Rocephin and metronidazole for tonight   Other plan as per orders.  DVT prophylaxis:  lovenox     Code Status:    Code Status: Prior FULL CODE care as per patient   I had personally discussed CODE STATUS with patient    Family Communication:   Family not at  Bedside    Disposition Plan:   To home once workup is complete and patient is stable   Following barriers for discharge:                            Electrolytes corrected                                                               able to transition to PO antibiotics                             Will need to be able to tolerate PO                            Will likely need home health, home O2, set up                            Will need consultants to evaluate patient prior to discharge                       Would benefit from PT/OT eval prior to DC  Ordered                                        Nutrition    consulted                                   Consults called:    PCCM is aware  Admission status:  ED Disposition     ED Disposition  [place]: [place] [[id number]]  Level of Care: Progressive [102]  Admit to Progressive based on following criteria: RESPIRATORY PROBLEMS hypoxemic/hypercapnic respiratory failure that is responsive to NIPPV (BiPAP) or High Flow Nasal Cannula (6-80 lpm). Frequent assessment/intervention, no > Q2 hrs < Q4 hrs, to maintain oxygenation and pulmonary hygiene.  May admit patient to [facility] or [facility] if equivalent level of care is available:: No  Covid Evaluation: Confirmed COVID Negative  Diagnosis: COPD exacerbation [facility]) [[id number]]  Admitting Physician: Toy Baker [[id number]]  Attending Physician: Toy Baker [[id number]]  Estimated length of stay: past midnight tomorrow  Certification:: I certify this patient will need inpatient services for at least 2 midnights            inpatient     I Expect 2  midnight stay secondary to severity of patient's current illness need for inpatient interventions justified by the following:  hemodynamic instability despite optimal treatment ( tachypnea  hypoxia,  ) * Severe lab/radiological/exam abnormalities including:     and extensive comorbidities including: COPD/asthma    That are currently affecting medical management.   I expect  patient to be hospitalized for 2 midnights requiring inpatient medical care.  Patient is at high risk for adverse outcome (such as loss of life or disability) if not treated.  Indication for inpatient stay as follows:   Hemodynamic instability despite maximal medical therapy,     New or worsening hypoxia    Need for IV antibiotics, IV fluids     Level of care        progressive tele indefinitely please discontinue once patient no longer qualifies COVID-19 Labs    Lab Results  Component Value Date   [place] 04/25/2021     Precautions: admitted as   Covid Negative    Cybil Sen[place] 04/26/2021, 4:32 AM    [facility]     after 2 AM please page floor coverage PA If 7AM-7PM, please contact the day team taking care of the patient using [email or web address]   Patient was evaluated in the context of the global COVID-19 pandemic, which necessitated consideration that the patient might be at risk for infection with the SARS-CoV-2 virus that causes COVID-19. Institutional protocols and algorithms that pertain to the evaluation of patients at risk for COVID-19 are in a state of rapid change based on information released by regulatory bodies including the CDC and federal and state organizations. These policies and algorithms were followed during the patient's care.

## 2021-04-25 NOTE — ED Provider Notes (Signed)
[facility] EMERGENCY DEPARTMENT Provider Note   CSN: [id number] Arrival date & time: 04/25/21  6283     History Chief Complaint  Patient presents with   Shortness of Breath   Cough    Vicki [name] is a 55 y.o. female presenting for evaluation of shortness of breath, cough, fever.  History divided by patient and husband.  They state for the past 2 days, patient has had intermittent fevers, increased shortness of breath and cough.  She becomes extremely winded, even with very minimal exertion.  Cough is nonproductive.  She has been using her inhalers, doubling the amount, with mild, short lived relief (~15 min).  She wears 2.5 L at night as needed, but recently has been wearing 4 L all the time to keep her oxygen over 90.  She does not feel improved on the higher amounts of oxygen.  She also reports nausea and diarrhea.  She is vaccinated for COVID and flu, denies sick contacts.  No chest pain.  She has lower abdominal pain, but no urinary symptoms. She is on O2 for her COPD. No other lung problems. No h/o heart problems or DM.   Additional history taken chart reviewed.  History of COPD on oxygen, depression, hypertension, hyperlipidemia  HPI     Past Medical History:  Diagnosis Date   Allergy    seasonal allergies   Anxiety    on meds   Arthritis    hands   Asthma    childhood-current   Carpal tunnel syndrome    had bilateral sx to tx   Cataract    bilateral--no sx yet   COPD (chronic obstructive pulmonary disease) ([place])    Depression    on meds   Diverticulitis    Emphysema of lung ([place])    uses MDI   Essential hypertension, benign 07/14/2019   on meds   Hyperlipidemia    on meds   Oxygen deficiency    home O2 at 2.5L/[place] nightly   Oxygen dependent    uses O2 at 2.5L/[place] nightly    Patient Active Problem List   Diagnosis Date Noted   COPD exacerbation ([place]) 04/25/2021   Aortic atherosclerosis ([place]) 10/28/2020   C. difficile colitis  10/19/2020   Diverticulitis 10/18/2020   Chronic urticaria 10/07/2020   Multiple drug allergies 10/07/2020   Anterior cruciate ligament complete tear 08/31/2020   Preop pulmonary/respiratory exam 07/28/2020   Polyp of transverse colon    Colon cancer screening    Generalized abdominal pain    Nausea and vomiting    Mucosal abnormality of rectum    Elevated triglycerides with high cholesterol 07/14/2019   RLS (restless legs syndrome) 07/14/2019   Tobacco use disorder 07/14/2019   Essential hypertension, benign 07/14/2019   Generalized anxiety disorder 07/14/2019   CAP (community acquired pneumonia) 04/07/2019   UTI (urinary tract infection) 04/07/2019   SIRS (systemic inflammatory response syndrome) ([place]) 04/07/2019   Suspected COVID-19 virus infection 04/07/2019   Viral gastroenteritis 04/07/2019   Fatigue 11/07/2018   Depression 11/07/2018   Insomnia 11/07/2018   Polypharmacy 07/19/2017   Asthma    Carpal tunnel syndrome    COPD (chronic obstructive pulmonary disease) ([place])    DDD (degenerative disc disease), lumbar 10/22/2016    Past Surgical History:  Procedure Laterality Date   ABDOMINAL HYSTERECTOMY  2009   APPENDECTOMY     ARTHROSCOPIC REPAIR ACL Left 2011/2014   x 2   BIOPSY  01/21/2020   Procedure: BIOPSY;  Surgeon: Mauri Pole, MD;  Location: [facility];  Service: Endoscopy;;  EGD and COLON   BUNIONECTOMY  2006   both   CARPAL TUNNEL RELEASE Bilateral 10/28/2013   Procedure: BILATERAL CARPAL TUNNEL RELEASE;  Surgeon: Wynonia Sours, MD;  Location: [place];  Service: Orthopedics;  Laterality: Bilateral;   CHOLECYSTECTOMY  1994   CHONDROPLASTY Right 08/31/2020   Procedure: CHONDROPLASTY;  Surgeon: Earlie Server, MD;  Location: [place];  Service: Orthopedics;  Laterality: Right;   COLONOSCOPY WITH PROPOFOL N/A 01/21/2020   Procedure: COLONOSCOPY WITH PROPOFOL;  Surgeon: Mauri Pole, MD;  Location: [facility];   Service: Endoscopy;  Laterality: N/A;   DILATION AND CURETTAGE OF UTERUS     ESOPHAGOGASTRODUODENOSCOPY (EGD) WITH PROPOFOL N/A 01/21/2020   Procedure: ESOPHAGOGASTRODUODENOSCOPY (EGD) WITH PROPOFOL;  Surgeon: Mauri Pole, MD;  Location: [facility];  Service: Endoscopy;  Laterality: N/A;   KNEE ARTHROSCOPY WITH ANTERIOR CRUCIATE LIGAMENT (ACL) REPAIR Right 08/31/2020   Procedure: RIGHT KNEE ARTHROSCOPY WITH ANTERIOR CRUCIATE LIGAMENT (ACL) REPAIR MEDIAL AND LATERAL  MENISECTOMY;  Surgeon: Earlie Server, MD;  Location: [place];  Service: Orthopedics;  Laterality: Right;  FEMORAL NERVE BLOCK   POLYPECTOMY  01/21/2020   Procedure: POLYPECTOMY;  Surgeon: Mauri Pole, MD;  Location: [facility];  Service: Endoscopy;;   TUBAL LIGATION  2004     OB History   No obstetric history on file.     Family History  Problem Relation Age of Onset   Brain cancer Mother 27   Lung cancer Mother 33   Breast cancer Sister 77   Ovarian cancer Sister 60   Colon cancer Neg Hx    Colon polyps Neg Hx    Esophageal cancer Neg Hx    Rectal cancer Neg Hx    Stomach cancer Neg Hx     Social History   Tobacco Use   Smoking status: Every Day    Packs/day: 1.00    Years: 34.00    Pack years: 34.00    Types: Cigarettes   Smokeless tobacco: Never  Vaping Use   Vaping Use: Never used  Substance Use Topics   Alcohol use: Yes    Comment: everyday   Drug use: No    Home Medications Prior to Admission medications   Medication Sig Start Date End Date Taking? Authorizing Provider  acetaminophen (TYLENOL) 500 MG tablet Take 1,000 mg by mouth every 6 (six) hours as needed for moderate pain.   Yes [provider]  albuterol (VENTOLIN HFA) 108 (90 Base) MCG/ACT inhaler Inhale 2 puffs into the lungs 2 (two) times daily. 06/13/20  Yes Maximiano Coss, NP  ALPRAZolam (XANAX) 1 MG tablet TAKE 1 TABLET BY MOUTH 3 TIMES DAILY AS NEEDED FOR ANXIETY. Patient taking differently:  Take 1 mg by mouth 3 (three) times daily as needed for anxiety. 04/10/21  Yes Maximiano Coss, NP  amLODipine (NORVASC) 5 MG tablet TAKE 1 TABLET (5 MG TOTAL) BY MOUTH DAILY. 03/28/21  Yes Maximiano Coss, NP  ASPIRIN LOW DOSE 81 MG EC tablet TAKE 1 TABLET BY MOUTH 2 TIMES DAILY. Patient taking differently: Take 81 mg by mouth in the morning and at bedtime. 03/21/21  Yes Maximiano Coss, NP  azelastine (ASTELIN) 0.1 % nasal spray Place 1 spray into both nostrils 2 (two) times daily. Use in each nostril as directed 10/27/20  Yes Maximiano Coss, NP  cetirizine (ZYRTEC) 10 MG tablet Take 1 tablet (10 mg total) by mouth  daily. 09/14/20  Yes Maximiano Coss, NP  citalopram (CELEXA) 20 MG tablet Take 3 tablets (60 mg total) by mouth daily. 10/21/20  Yes Florencia Reasons, MD  eletriptan (RELPAX) 40 MG tablet TAKE 1 TABLET AT ONSET OF MIGRAINE OR HEADACHE AS NEEDED. MAY REPEAT IN 2 HOURS IF SYMPTOMS PERSIST. Patient taking differently: Take 40 mg by mouth as directed. Take 1 tablet at onset of Migraine or Headache as needed. May repeat in 2 hours if symptoms persist. 01/12/21  Yes Maximiano Coss, NP  EPINEPHrine (EPIPEN 2-PAK) 0.3 mg/0.3 mL IJ SOAJ injection Inject 0.3 mg into the muscle as needed for anaphylaxis. 12/27/20  Yes Maximiano Coss, NP  famotidine (PEPCID) 20 MG tablet TAKE 1 TABLET BY MOUTH TWICE A DAY (MORNING AND NIGHT) Patient taking differently: Take 20 mg by mouth 2 (two) times daily at 8 am and 10 pm. 11/07/20  Yes Valentina Shaggy, MD  fexofenadine (ALLEGRA) 180 MG tablet Take 1 tablet (180 mg total) by mouth daily. 10/21/20  Yes Florencia Reasons, MD  gabapentin (NEURONTIN) 300 MG capsule Take 1 capsule (300 mg total) by mouth 2 (two) times daily. 06/13/20  Yes Maximiano Coss, NP  hydrOXYzine (ATARAX/VISTARIL) 25 MG tablet Take 0.5-1 tablets (12.5-25 mg total) by mouth 3 (three) times daily as needed. Patient taking differently: Take 12.5-25 mg by mouth 3 (three) times daily as needed for anxiety. 01/12/21   Yes Maximiano Coss, NP  meclizine (ANTIVERT) 25 MG tablet Take 1 tablet (25 mg total) by mouth 3 (three) times daily as needed for dizziness. 12/27/20  Yes Maximiano Coss, NP  Menthol, Topical Analgesic, (BIOFREEZE EX) Apply 1 application topically daily as needed (back pain).   Yes [provider]  montelukast (SINGULAIR) 10 MG tablet TAKE 1 TABLET BY MOUTH EVERYDAY AT BEDTIME Patient taking differently: Take 10 mg by mouth at bedtime. 01/20/21  Yes Maximiano Coss, NP  omeprazole (PRILOSEC) 20 MG capsule Take 1 capsule (20 mg total) by mouth 2 (two) times daily. Take 1 capsule (20 mg total) by mouth 2 (two) times daily. 01/11/21 04/25/21 Yes Maximiano Coss, NP  ondansetron (ZOFRAN) 8 MG tablet Take 1 tablet (8 mg total) by mouth every 8 (eight) hours as needed for nausea or vomiting. 01/12/21  Yes Maximiano Coss, NP  prazosin (MINIPRESS) 1 MG capsule TAKE 1 TO 2 CAPSULES (1 TO 2 MG TOTAL) BY MOUTH AT BEDTIME. Patient taking differently: Take 1-2 mg by mouth at bedtime. 12/27/20  Yes Maximiano Coss, NP  promethazine (PHENERGAN) 25 MG tablet Take 1 tablet (25 mg total) by mouth every 8 (eight) hours as needed for nausea or vomiting. 12/27/20  Yes Maximiano Coss, NP  rOPINIRole (REQUIP) 2 MG tablet Take 0.5-1 tablets (1-2 mg total) by mouth at bedtime. 12/27/20  Yes Maximiano Coss, NP  STIOLTO RESPIMAT 2.5-2.5 MCG/ACT AERS Inhale 1 puff into the lungs daily. 04/21/21  Yes [provider]  traZODone (DESYREL) 50 MG tablet Take 1 tablet (50 mg total) by mouth at bedtime. 10/21/20  Yes Florencia Reasons, MD  umeclidinium bromide (INCRUSE ELLIPTA) 62.5 MCG/INH AEPB Inhale 1 puff into the lungs daily. 10/21/20  Yes Florencia Reasons, MD  dapsone 25 MG tablet TAKE 1 TABLET BY MOUTH DAILY FOR 14 DAYS, THEN 2 TABLETS DAILY. Patient not taking: Reported on 04/25/2021 03/02/21   Maximiano Coss, NP  Fluticasone-Salmeterol (ADVAIR DISKUS) 500-50 MCG/DOSE AEPB Inhale 1 puff into the lungs in the morning and at  bedtime. Patient not taking: Reported on 04/25/2021 06/13/20   Maximiano Coss,  NP  HYDROcodone-acetaminophen (NORCO) 7.5-325 MG tablet Take 1 tablet by mouth every 4 (four) hours as needed for moderate pain. Patient not taking: Reported on 04/25/2021 08/31/20   Chriss Czar, PA-C  triamcinolone cream (KENALOG) 0.1 % Apply 1 application topically 2 (two) times daily. Patient not taking: Reported on 04/25/2021 09/14/20   Maximiano Coss, NP    Allergies    Jasmine oil, Levaquin [levofloxacin], Penicillins, Aleve [naproxen sodium], Ceftin [cefuroxime axetil], Depo-provera [medroxyprogesterone acetate], Doxycycline, Oxycodone, and Tramadol  Review of Systems   Review of Systems  Constitutional:  Positive for fever.  Respiratory:  Positive for cough and shortness of breath.   Gastrointestinal:  Positive for diarrhea and nausea.  Neurological:  Positive for headaches.  All other systems reviewed and are negative.  Physical Exam Updated Vital Signs BP 95/69    Pulse (!) 110    Temp 98.7 F (37.1 C) (Oral)    Resp 18    SpO2 94%   Physical Exam Vitals and nursing note reviewed.  Constitutional:      Appearance: Normal appearance. She is obese. She is ill-appearing.     Comments: Appears short of breath, ill appearing  HENT:     Head: Normocephalic and atraumatic.  Eyes:     Conjunctiva/sclera: Conjunctivae normal.     Pupils: Pupils are equal, round, and reactive to light.  Cardiovascular:     Rate and Rhythm: Regular rhythm. Tachycardia present.     Pulses: Normal pulses.     Comments: Tachycardic around 120 Pulmonary:     Effort: Tachypnea, accessory muscle usage and respiratory distress present.     Breath sounds: Rhonchi present. No wheezing.     Comments: Pulmonary exam limited due to pt's coughing. Frequent, nonproductive cough noted. Speaking in short, 3-4 word sentences. SpO2 in the mid 90's on 4L via [place]. Coarse lung sounds, no obvious wheezing Abdominal:     General:  There is no distension.     Palpations: Abdomen is soft. There is no mass.     Tenderness: There is no abdominal tenderness. There is no guarding or rebound.  Musculoskeletal:        General: Normal range of motion.     Cervical back: Normal range of motion and neck supple.     Right lower leg: No edema.     Left lower leg: No edema.  Skin:    General: Skin is warm and dry.     Capillary Refill: Capillary refill takes less than 2 seconds.  Neurological:     Mental Status: She is alert and oriented to person, place, and time.  Psychiatric:        Mood and Affect: Mood and affect normal.        Speech: Speech normal.        Behavior: Behavior normal.    ED Results / Procedures / Treatments   Labs (all labs ordered are listed, but only abnormal results are displayed) Labs Reviewed  CBC - Abnormal; Notable for the following components:      Result Value   WBC 10.7 (*)    All other components within normal limits  COMPREHENSIVE METABOLIC PANEL - Abnormal; Notable for the following components:   Chloride 93 (*)    Creatinine, Ser 1.10 (*)    Calcium 8.6 (*)    Albumin 3.3 (*)    Alkaline Phosphatase 153 (*)    All other components within normal limits  RESP PANEL BY RT-PCR (FLU A&B, COVID) ARPGX2  BRAIN NATRIURETIC PEPTIDE  BLOOD GAS, VENOUS    EKG None  Radiology DG Chest Portable 1 View  Result Date: 04/25/2021 CLINICAL DATA:  Short of breath, headache EXAM: PORTABLE CHEST 1 VIEW COMPARISON:  10/18/2020 FINDINGS: Single frontal view of the chest demonstrates an unremarkable cardiac silhouette. Chronic background interstitial scarring without acute airspace disease, effusion, or pneumothorax. No acute bony abnormalities. IMPRESSION: 1. No acute intrathoracic process. Electronically Signed   By: Randa Ngo M.D.   On: 04/25/2021 20:26    Procedures Procedures   Medications Ordered in ED Medications  ipratropium-albuterol (DUONEB) 0.5-2.5 (3) MG/3ML nebulizer solution  3 mL (3 mLs Nebulization Given 04/25/21 1935)  methylPREDNISolone sodium succinate (SOLU-MEDROL) 125 mg/2 mL injection 125 mg (125 mg Intravenous Given 04/25/21 2008)  magnesium sulfate IVPB 2 g 50 mL (0 g Intravenous Stopped 04/25/21 2119)  albuterol (PROVENTIL) (2.5 MG/3ML) 0.083% nebulizer solution 5 mg (5 mg Nebulization Given 04/25/21 2006)  ketorolac (TORADOL) 15 MG/ML injection 15 mg (15 mg Intravenous Given 04/25/21 2118)  albuterol (PROVENTIL) (2.5 MG/3ML) 0.083% nebulizer solution 12 mL (12 mLs Nebulization Given 04/25/21 2125)    ED Course  I have reviewed the triage vital signs and the nursing notes.  Pertinent labs & imaging results that were available during my care of the patient were reviewed by me and considered in my medical decision making (see chart for details).    MDM Rules/Calculators/A&P                           Pt presenting for evaluation of SOB, cough, fever. On exam, pt appears ill. She is SOB, speaking in short sentences. Difficult to appreciate lung sound based on the amt of coughing. Labs, resp panel, and cxr ordered from traige. Will give solumedrol, mag, and another breathing tx. Concern for COPD exacerbation, PNA, viral illness. Less likely acs, no CP. Less like PE.   Cxr viewed and independently interpreted by me, no PNA, PNX, effusion. Will hold on abx  On reevaluation, pt is coughing less. She has inspiratory and expiratory wheezing in lower lungs bilaterally.  Placed on continuous neb.  Will call for admission.   Discussed with Dr. Roel Cluck from [facility], patient to be admitted.  Final Clinical Impression(s) / ED Diagnoses Final diagnoses:  COPD exacerbation [facility])    Rx / DC Orders ED Discharge Orders     None        Franchot Heidelberg, PA-C 04/25/21 2230    Truddie Hidden, MD 04/25/21 2238

## 2021-04-25 NOTE — Telephone Encounter (Signed)
Pt called in asking for a refill on the Eletriptan, pt uses [facility]. Please advise

## 2021-04-26 ENCOUNTER — Inpatient Hospital Stay (HOSPITAL_COMMUNITY): Payer: 59

## 2021-04-26 ENCOUNTER — Other Ambulatory Visit: Payer: Self-pay | Admitting: Registered Nurse

## 2021-04-26 ENCOUNTER — Encounter (HOSPITAL_COMMUNITY): Payer: Self-pay | Admitting: Internal Medicine

## 2021-04-26 DIAGNOSIS — R1030 Lower abdominal pain, unspecified: Secondary | ICD-10-CM | POA: Diagnosis present

## 2021-04-26 DIAGNOSIS — G43909 Migraine, unspecified, not intractable, without status migrainosus: Secondary | ICD-10-CM

## 2021-04-26 DIAGNOSIS — R197 Diarrhea, unspecified: Secondary | ICD-10-CM | POA: Diagnosis present

## 2021-04-26 DIAGNOSIS — R112 Nausea with vomiting, unspecified: Secondary | ICD-10-CM | POA: Diagnosis present

## 2021-04-26 LAB — I-STAT VENOUS BLOOD GAS, ED
Acid-Base Excess: 2 mmol/L (ref 0.0–2.0)
Acid-Base Excess: 2 mmol/L (ref 0.0–2.0)
Bicarbonate: 27.1 mmol/L (ref 20.0–28.0)
Bicarbonate: 29.5 mmol/L — ABNORMAL HIGH (ref 20.0–28.0)
Calcium, Ion: 1 mmol/L — ABNORMAL LOW (ref 1.15–1.40)
Calcium, Ion: 1.01 mmol/L — ABNORMAL LOW (ref 1.15–1.40)
HCT: 39 % (ref 36.0–46.0)
HCT: 40 % (ref 36.0–46.0)
Hemoglobin: 13.3 g/dL (ref 12.0–15.0)
Hemoglobin: 13.6 g/dL (ref 12.0–15.0)
O2 Saturation: 100 %
O2 Saturation: 98 %
Potassium: 2.8 mmol/L — ABNORMAL LOW (ref 3.5–5.1)
Potassium: 3.2 mmol/L — ABNORMAL LOW (ref 3.5–5.1)
Sodium: 134 mmol/L — ABNORMAL LOW (ref 135–145)
Sodium: 134 mmol/L — ABNORMAL LOW (ref 135–145)
TCO2: 28 mmol/L (ref 22–32)
TCO2: 31 mmol/L (ref 22–32)
pCO2, Ven: 44 mmHg (ref 44.0–60.0)
pCO2, Ven: 56.8 mmHg (ref 44.0–60.0)
pH, Ven: 7.324 (ref 7.250–7.430)
pH, Ven: 7.398 (ref 7.250–7.430)
pO2, Ven: 112 mmHg — ABNORMAL HIGH (ref 32.0–45.0)
pO2, Ven: 216 mmHg — ABNORMAL HIGH (ref 32.0–45.0)

## 2021-04-26 LAB — CBC WITH DIFFERENTIAL/PLATELET
Abs Immature Granulocytes: 0.02 10*3/uL (ref 0.00–0.07)
Basophils Absolute: 0 10*3/uL (ref 0.0–0.1)
Basophils Relative: 0 %
Eosinophils Absolute: 0 10*3/uL (ref 0.0–0.5)
Eosinophils Relative: 0 %
HCT: 38.9 % (ref 36.0–46.0)
Hemoglobin: 12.7 g/dL (ref 12.0–15.0)
Immature Granulocytes: 0 %
Lymphocytes Relative: 7 %
Lymphs Abs: 0.5 10*3/uL — ABNORMAL LOW (ref 0.7–4.0)
MCH: 32.1 pg (ref 26.0–34.0)
MCHC: 32.6 g/dL (ref 30.0–36.0)
MCV: 98.2 fL (ref 80.0–100.0)
Monocytes Absolute: 0.2 10*3/uL (ref 0.1–1.0)
Monocytes Relative: 3 %
Neutro Abs: 6 10*3/uL (ref 1.7–7.7)
Neutrophils Relative %: 90 %
Platelets: 255 10*3/uL (ref 150–400)
RBC: 3.96 MIL/uL (ref 3.87–5.11)
RDW: 14.5 % (ref 11.5–15.5)
WBC: 6.7 10*3/uL (ref 4.0–10.5)
nRBC: 0 % (ref 0.0–0.2)

## 2021-04-26 LAB — URINALYSIS, ROUTINE W REFLEX MICROSCOPIC
Glucose, UA: NEGATIVE mg/dL
Hgb urine dipstick: NEGATIVE
Ketones, ur: 15 mg/dL — AB
Leukocytes,Ua: NEGATIVE
Nitrite: NEGATIVE
Protein, ur: NEGATIVE mg/dL
Specific Gravity, Urine: >1.03 — ABNORMAL HIGH (ref 1.005–1.030)
pH: 5.5 (ref 5.0–8.0)

## 2021-04-26 LAB — C DIFFICILE QUICK SCREEN W PCR REFLEX
C Diff antigen: POSITIVE — AB
C Diff toxin: NEGATIVE

## 2021-04-26 LAB — COMPREHENSIVE METABOLIC PANEL
ALT: 31 U/L (ref 0–44)
AST: 35 U/L (ref 15–41)
Albumin: 3 g/dL — ABNORMAL LOW (ref 3.5–5.0)
Alkaline Phosphatase: 158 U/L — ABNORMAL HIGH (ref 38–126)
Anion gap: 12 (ref 5–15)
BUN: 19 mg/dL (ref 6–20)
CO2: 27 mmol/L (ref 22–32)
Calcium: 8.4 mg/dL — ABNORMAL LOW (ref 8.9–10.3)
Chloride: 94 mmol/L — ABNORMAL LOW (ref 98–111)
Creatinine, Ser: 1.02 mg/dL — ABNORMAL HIGH (ref 0.44–1.00)
GFR, Estimated: >60 mL/min (ref >60)
Glucose, Bld: 211 mg/dL — ABNORMAL HIGH (ref 70–99)
Potassium: 3.2 mmol/L — ABNORMAL LOW (ref 3.5–5.1)
Sodium: 133 mmol/L — ABNORMAL LOW (ref 135–145)
Total Bilirubin: 0.5 mg/dL (ref 0.3–1.2)
Total Protein: 7 g/dL (ref 6.5–8.1)

## 2021-04-26 LAB — RESPIRATORY PANEL BY PCR

## 2021-04-26 LAB — EXPECTORATED SPUTUM ASSESSMENT W GRAM STAIN, RFLX TO RESP C

## 2021-04-26 LAB — LACTIC ACID, PLASMA
Lactic Acid, Venous: 2.3 mmol/L (ref 0.5–1.9)
Lactic Acid, Venous: 2.5 mmol/L (ref 0.5–1.9)

## 2021-04-26 LAB — URINE CULTURE: Culture: NO GROWTH

## 2021-04-26 LAB — PROCALCITONIN: Procalcitonin: 0.33 ng/mL

## 2021-04-26 LAB — MAGNESIUM: Magnesium: 2.6 mg/dL — ABNORMAL HIGH (ref 1.7–2.4)

## 2021-04-26 LAB — SODIUM, URINE, RANDOM: Sodium, Ur: 29 mmol/L

## 2021-04-26 LAB — CLOSTRIDIUM DIFFICILE BY PCR, REFLEXED: Toxigenic C. Difficile by PCR: POSITIVE — AB

## 2021-04-26 LAB — PHOSPHORUS: Phosphorus: 4.5 mg/dL (ref 2.5–4.6)

## 2021-04-26 LAB — MRSA NEXT GEN BY PCR, NASAL: MRSA by PCR Next Gen: NOT DETECTED

## 2021-04-26 LAB — TSH: TSH: 0.698 u[IU]/mL (ref 0.350–4.500)

## 2021-04-26 LAB — OSMOLALITY, URINE: Osmolality, Ur: 532 mOsm/kg (ref 300–900)

## 2021-04-26 MED ORDER — IOHEXOL 300 MG/ML  SOLN
80.0000 mL | Freq: Once | INTRAMUSCULAR | Status: AC | PRN
  Administered 2021-04-26: 07:00:00 80 mL via INTRAVENOUS

## 2021-04-26 MED ORDER — SODIUM CHLORIDE 0.9 % IV SOLN
2.0000 g | Freq: Once | INTRAVENOUS | Status: AC
  Administered 2021-04-26: 02:00:00 2 g via INTRAVENOUS
  Filled 2021-04-26: qty 20

## 2021-04-26 MED ORDER — POTASSIUM CHLORIDE CRYS ER 20 MEQ PO TBCR
40.0000 meq | EXTENDED_RELEASE_TABLET | Freq: Once | ORAL | Status: AC
  Administered 2021-04-26: 15:00:00 40 meq via ORAL
  Filled 2021-04-26: qty 2

## 2021-04-26 MED ORDER — GUAIFENESIN ER 600 MG PO TB12
1200.0000 mg | ORAL_TABLET | Freq: Two times a day (BID) | ORAL | Status: DC
  Administered 2021-04-26 – 2021-04-30 (×10): 1200 mg via ORAL
  Filled 2021-04-26 (×10): qty 2

## 2021-04-26 MED ORDER — SODIUM CHLORIDE 0.9 % IV BOLUS
500.0000 mL | Freq: Once | INTRAVENOUS | Status: AC
  Administered 2021-04-26: 04:00:00 500 mL via INTRAVENOUS

## 2021-04-26 MED ORDER — OXYMETAZOLINE HCL 0.05 % NA SOLN
1.0000 | Freq: Two times a day (BID) | NASAL | Status: AC
  Administered 2021-04-26 – 2021-04-28 (×6): 1 via NASAL
  Filled 2021-04-26 (×2): qty 30

## 2021-04-26 MED ORDER — LORATADINE 10 MG PO TABS
10.0000 mg | ORAL_TABLET | Freq: Every day | ORAL | Status: DC
  Administered 2021-04-26 – 2021-04-30 (×5): 10 mg via ORAL
  Filled 2021-04-26 (×5): qty 1

## 2021-04-26 MED ORDER — PANTOPRAZOLE SODIUM 40 MG PO TBEC: 40.0000 mg | DELAYED_RELEASE_TABLET | Freq: Two times a day (BID) | ORAL | Status: DC

## 2021-04-26 MED ORDER — ALPRAZOLAM 0.5 MG PO TABS
0.5000 mg | ORAL_TABLET | Freq: Three times a day (TID) | ORAL | Status: DC | PRN
  Administered 2021-04-26 – 2021-04-30 (×5): 0.5 mg via ORAL
  Filled 2021-04-26 (×5): qty 1

## 2021-04-26 MED ORDER — HYDROCOD POLST-CPM POLST ER 10-8 MG/5ML PO SUER
5.0000 mL | Freq: Two times a day (BID) | ORAL | Status: DC | PRN
  Administered 2021-04-26 – 2021-05-01 (×3): 5 mL via ORAL
  Filled 2021-04-26 (×3): qty 5

## 2021-04-26 MED ORDER — DEXTROMETHORPHAN POLISTIREX ER 30 MG/5ML PO SUER
30.0000 mg | Freq: Every evening | ORAL | Status: DC | PRN
  Administered 2021-04-26: 30 mg via ORAL
  Filled 2021-04-26: qty 5

## 2021-04-26 MED ORDER — METHYLPREDNISOLONE SODIUM SUCC 125 MG IJ SOLR
60.0000 mg | Freq: Two times a day (BID) | INTRAMUSCULAR | Status: DC
  Administered 2021-04-26 – 2021-04-28 (×5): 60 mg via INTRAVENOUS
  Filled 2021-04-26 (×5): qty 2

## 2021-04-26 MED ORDER — MENTHOL 3 MG MT LOZG
1.0000 | LOZENGE | OROMUCOSAL | Status: DC | PRN
  Filled 2021-04-26: qty 9

## 2021-04-26 MED ORDER — ALBUTEROL SULFATE (2.5 MG/3ML) 0.083% IN NEBU
5.0000 mg/h | INHALATION_SOLUTION | RESPIRATORY_TRACT | Status: DC
  Administered 2021-04-26: 02:00:00 5 mg/h via RESPIRATORY_TRACT
  Filled 2021-04-26: qty 9

## 2021-04-26 MED ORDER — ENOXAPARIN SODIUM 40 MG/0.4ML IJ SOSY
40.0000 mg | PREFILLED_SYRINGE | INTRAMUSCULAR | Status: DC
  Administered 2021-04-26 – 2021-04-30 (×5): 40 mg via SUBCUTANEOUS
  Filled 2021-04-26 (×5): qty 0.4

## 2021-04-26 MED ORDER — METRONIDAZOLE 500 MG/100ML IV SOLN
500.0000 mg | Freq: Three times a day (TID) | INTRAVENOUS | Status: DC
  Administered 2021-04-26: 03:00:00 500 mg via INTRAVENOUS
  Filled 2021-04-26: qty 100

## 2021-04-26 MED ORDER — RIZATRIPTAN BENZOATE 10 MG PO TABS: 10.0000 mg | ORAL_TABLET | ORAL | 0 refills | Status: DC | PRN

## 2021-04-26 MED ORDER — PANTOPRAZOLE SODIUM 40 MG IV SOLR
40.0000 mg | Freq: Two times a day (BID) | INTRAVENOUS | Status: DC
  Administered 2021-04-26 – 2021-04-27 (×3): 40 mg via INTRAVENOUS
  Filled 2021-04-26 (×3): qty 40

## 2021-04-26 MED ORDER — BENZONATATE 100 MG PO CAPS
200.0000 mg | ORAL_CAPSULE | Freq: Three times a day (TID) | ORAL | Status: DC
  Administered 2021-04-26 – 2021-04-30 (×14): 200 mg via ORAL
  Filled 2021-04-26 (×14): qty 2

## 2021-04-26 MED ORDER — AZITHROMYCIN 500 MG PO TABS
500.0000 mg | ORAL_TABLET | Freq: Every day | ORAL | Status: AC
  Administered 2021-04-26 – 2021-04-28 (×3): 500 mg via ORAL
  Filled 2021-04-26 (×2): qty 1
  Filled 2021-04-26: qty 2

## 2021-04-26 MED ORDER — FLUTICASONE PROPIONATE 50 MCG/ACT NA SUSP
2.0000 | Freq: Every day | NASAL | Status: DC
  Administered 2021-04-27 – 2021-04-30 (×4): 2 via NASAL
  Filled 2021-04-26: qty 16

## 2021-04-26 MED ORDER — IPRATROPIUM-ALBUTEROL 0.5-2.5 (3) MG/3ML IN SOLN
3.0000 mL | RESPIRATORY_TRACT | Status: DC
  Administered 2021-04-26 – 2021-04-28 (×10): 3 mL via RESPIRATORY_TRACT
  Filled 2021-04-26 (×11): qty 3

## 2021-04-26 MED ORDER — ELETRIPTAN HYDROBROMIDE 40 MG PO TABS: ORAL_TABLET | ORAL | 5 refills | Status: DC

## 2021-04-26 MED ORDER — SODIUM CHLORIDE 0.9 % IV SOLN
2.0000 g | INTRAVENOUS | Status: AC
  Administered 2021-04-26 – 2021-05-01 (×5): 2 g via INTRAVENOUS
  Filled 2021-04-26 (×5): qty 20

## 2021-04-26 NOTE — Progress Notes (Signed)
RT called to bedside. Patient not tolerating bipap. Patient placed on 5L by RN. Vitals are stable at this time.

## 2021-04-26 NOTE — Assessment & Plan Note (Signed)
Given some sedaion hold xanax for today may need to restart when no evidence of somnolence

## 2021-04-26 NOTE — Assessment & Plan Note (Addendum)
-  SIRS criteria met with  tachycardia   , fever at home  RR >20 Today's Vitals   04/26/21 0215 04/26/21 0230 04/26/21 0245 04/26/21 0300  BP: 106/70 102/77 110/76 110/66  Pulse: 87 98 (!) 110 (!) 111  Resp: 20 (!) _0 Temp:      TempSrc:      SpO2: 97% 95% 97% 96%  PainSc:        -Most likely source being  pulmonary, intra-abdominal, Source of sepsis is unclear but given clinical picture will continue to treat  Patient meeting criteria for Severe sepsis with    evidence of end organ damage/organ dysfunction such as    elevated lactic acid >2     Component Value Date/Time   LATICACIDVEN 2.3 (HH) 04/26/2021 0220   acute hypoxia requiring new supplemental oxygen, SpO2: 96 % O2 Flow Rate (L/min): 4 L/min     - Obtain serial lactic acid and procalcitonin level.  - Initiated IV antibiotics in ER: Antibiotics Given (last 72 hours)    Date/Time Action Medication Dose Rate   04/26/21 0144 New Bag/Given   cefTRIAXone (ROCEPHIN) 2 g in sodium chloride 0.9 % 100 mL IVPB 2 g 200 mL/hr   04/26/21 0237 New Bag/Given   metroNIDAZOLE (FLAGYL) IVPB 500 mg 500 mg 100 mL/hr     Will continue     - await results of blood and urine culture  - Rehydrate y  Intravenous fluids were administered,    4:09 AM

## 2021-04-26 NOTE — ED Notes (Signed)
Ct updated on pt status and notified that pt has drank approximately 1.5 bottles of oral contrast.

## 2021-04-26 NOTE — Assessment & Plan Note (Signed)
Check lipid panel  

## 2021-04-26 NOTE — ED Notes (Addendum)
Provider notified this RN of desire to place pt on BiPAP. RT called to bedside to perform task, however pt was noted to cough inconsolably, and RT expressed concerns about aspiration risk. This RN notified provider of the same. New orders given and RN will assess pt for effectiveness or other changes.

## 2021-04-26 NOTE — Telephone Encounter (Signed)
Sent Thanks Rich

## 2021-04-26 NOTE — Assessment & Plan Note (Addendum)
this patient has acute respiratory failure with Hypoxia and Hypercarbia as documented by the presence of following: O2 saturatio< 90% on RA pH <7.35 with pCO2 >50   Likely due to: COPD exacerbation  Provide O2 therapy and titrate as needed  Continuous pulse ox   check Pulse ox with ambulation prior to discharge  flutter valve ordered

## 2021-04-26 NOTE — Assessment & Plan Note (Signed)
-   Spoke about importance of quitting spent 5 minutes discussing options for treatment, prior attempts at quitting, and dangers of smoking ? -At this point patient is    interested in quitting ? - order nicotine patch  ? - nursing tobacco cessation protocol ? ?

## 2021-04-26 NOTE — ED Notes (Signed)
RN at bedside to re-assess pt status. Pt noted to be connected to purewick with no urine output. RN  assessed pt need to urinate, pt reports feeling bladder fullness but inability to pee. Admitting provider notified of same and intermittent catheterization verbally ordered. Task completed by this RN and pt tolerated well. Approximately 29ml returned.

## 2021-04-26 NOTE — ED Notes (Signed)
Barrier cream applied to pts bottom

## 2021-04-26 NOTE — Assessment & Plan Note (Signed)
Obtain CT abd/pelvis given abd tenderness, and fever

## 2021-04-26 NOTE — Assessment & Plan Note (Signed)
Con't requip  

## 2021-04-26 NOTE — ED Notes (Addendum)
Admitting provider at bedside.

## 2021-04-26 NOTE — Progress Notes (Signed)
PROGRESS NOTE        PATIENT DETAILS Name: Vicki [name] Age: 55 y.o. Sex: female Date of Birth: 11/22/1965 Admit Date: 04/25/2021 Admitting Physician Toy Baker, MD DHR:CBULAG, Richard, NP  Brief Narrative: Patient is a 55 y.o. female with history of COPD on home O2 who presented with cough, worsening shortness of breath for the past 3-4 days-she was found to have COPD exacerbation and community-acquired pneumonia and subsequently admitted to the hospitalist service.  See below for further details.  Subjective: Anxious-continues to have coughing spells-still wheezing  Objective: Vitals: Blood pressure 118/82, pulse 95, temperature 98.7 F (37.1 C), temperature source Oral, resp. rate (!) 21, SpO2 92 %.   Exam: Gen Exam:Alert awake-not in any distress HEENT:atraumatic, normocephalic Chest: Diminished air entry at bases-coarse rhonchi all over CVS:S1S2 regular Abdomen:soft non tender, non distended Extremities:no edema Neurology: Non focal Skin: no rash  Pertinent Labs/Radiology: Recent Labs  Lab 04/25/21 1946 04/26/21 0006 04/26/21 0129 04/26/21 0337  WBC 10.7*  --  6.7  --   HGB 13.3   < > 12.7 13.3  PLT 296  --  255  --   NA 136   < > 133* 134*  K 3.5   < > 3.2* 3.2*  CREATININE 1.10*  --  1.02*  --   AST 36  --  35  --   ALT 32  --  31  --   ALKPHOS 153*  --  158*  --   BILITOT 0.9  --  0.5  --    < > = values in this interval not displayed.     Assessment/Plan: Acute on chronic hypoxic respiratory failure due to COPD exacerbation and community-acquired pneumonia: Continues to have coughing spells-stable on 4-5 L of oxygen-continue Rocephin-add Zithromax-stop Flagyl.  Influenza/COVID/respiratory virus panel all negative.  Follow blood/sputum cultures.Change prednisone to IV Solu-Medrol-continue bronchodilators-encourage mobility/incentive spirometry.    SIRS due to community-acquired pneumonia: SIRS physiology  improving-on empiric IV antibiotics-follow cultures.  Acute urinary retention: Complaining of inability urinate-and lower abdominal pain-Place Foley catheter-voiding trial in the next 24 to 48 hours.  Diarrhea: Seems to have resolved.  Hypokalemia: Replete and recheck  HTN: BP stable-continue to hold all antihypertensives-resume when able.  AKI: Mild-likely hemodynamically mediated-follow.  Restless leg syndrome: Continue Requip  Anxiety: Continue as needed Xanax/Celexa  BMI Estimated body mass index is 29.41 kg/m as calculated from the following:   Height as of an earlier encounter on 04/25/21: 5\' 3"  (1.6 m).   Weight as of an earlier encounter on 04/25/21: 75.3 kg.    Procedures: None Consults: None DVT Prophylaxis: Lovenox Code Status:Full code Family Communication: None at bedside  Time spent: 35 minutes-Greater than 50% of this time was spent in counseling, explanation of diagnosis, planning of further management, and coordination of care.   Disposition Plan: Status is: Inpatient  Remains inpatient appropriate because: Acute on chronic hypoxic respiratory failure due to COPD exacerbation provoked by PNA-on 5-6 L of oxygen (more than baseline) on IV antibiotics-not yet stable for discharge.   Diet: Diet Order             Diet Heart Room service appropriate? Yes; Fluid consistency: Thin  Diet effective now                     Antimicrobial agents: Anti-infectives (From admission, onward)  Start     Dose/Rate Route Frequency Ordered Stop   04/27/21 0000  cefTRIAXone (ROCEPHIN) 2 g in sodium chloride 0.9 % 100 mL IVPB        2 g 200 mL/hr over 30 Minutes Intravenous Every 24 hours 04/26/21 0125     04/26/21 1000  azithromycin (ZITHROMAX) tablet 500 mg        500 mg Oral Daily 04/26/21 0832 04/29/21 0959   04/26/21 0200  metroNIDAZOLE (FLAGYL) IVPB 500 mg  Status:  Discontinued        500 mg 100 mL/hr over 60 Minutes Intravenous Every 8 hours 04/26/21  0140 04/26/21 0834   04/26/21 0130  cefTRIAXone (ROCEPHIN) 2 g in sodium chloride 0.9 % 100 mL IVPB        2 g 200 mL/hr over 30 Minutes Intravenous  Once 04/26/21 0129 04/26/21 0237        MEDICATIONS: Scheduled Meds:  azithromycin  500 mg Oral Daily   benzonatate  200 mg Oral TID   citalopram  60 mg Oral Daily   enoxaparin (LOVENOX) injection  40 mg Subcutaneous Q24H   fluticasone  2 spray Each Nare Daily   gabapentin  300 mg Oral BID   guaiFENesin  1,200 mg Oral BID   ipratropium-albuterol  3 mL Nebulization Q4H   loratadine  10 mg Oral Daily   methylPREDNISolone (SOLU-MEDROL) injection  60 mg Intravenous Q12H   montelukast  10 mg Oral QHS   oxymetazoline  1 spray Each Nare BID   pantoprazole (PROTONIX) IV  40 mg Intravenous Q12H   rOPINIRole  1-2 mg Oral QHS   Continuous Infusions:  [START ON 04/27/2021] cefTRIAXone (ROCEPHIN)  IV     PRN Meds:.acetaminophen **OR** acetaminophen, albuterol, ALPRAZolam, chlorpheniramine-HYDROcodone, HYDROcodone-acetaminophen, menthol-cetylpyridinium, ondansetron **OR** ondansetron (ZOFRAN) IV   I have personally reviewed following labs and imaging studies  LABORATORY DATA: CBC: Recent Labs  Lab 04/25/21 1946 04/26/21 0006 04/26/21 0129 04/26/21 0337  WBC 10.7*  --  6.7  --   NEUTROABS  --   --  6.0  --   HGB 13.3 13.6 12.7 13.3  HCT 40.8 40.0 38.9 39.0  MCV 97.6  --  98.2  --   PLT 296  --  255  --     Basic Metabolic Panel: Recent Labs  Lab 04/25/21 1946 04/26/21 0006 04/26/21 0129 04/26/21 0337  NA 136 134* 133* 134*  K 3.5 2.8* 3.2* 3.2*  CL 93*  --  94*  --   CO2 30  --  27  --   GLUCOSE 96  --  211*  --   BUN 18  --  19  --   CREATININE 1.10*  --  1.02*  --   CALCIUM 8.6*  --  8.4*  --   MG  --   --  2.6*  --   PHOS  --   --  4.5  --     GFR: Estimated Creatinine Clearance: 62 mL/min (A) (by C-G formula based on SCr of 1.02 mg/dL (H)).  Liver Function Tests: Recent Labs  Lab 04/25/21 1946  04/26/21 0129  AST 36 35  ALT 32 31  ALKPHOS 153* 158*  BILITOT 0.9 0.5  PROT 7.2 7.0  ALBUMIN 3.3* 3.0*   No results for input(s): LIPASE, AMYLASE in the last 168 hours. No results for input(s): AMMONIA in the last 168 hours.  Coagulation Profile: No results for input(s): INR, PROTIME in the last 168 hours.  Cardiac Enzymes:  No results for input(s): CKTOTAL, CKMB, CKMBINDEX, TROPONINI in the last 168 hours.  BNP (last 3 results) No results for input(s): PROBNP in the last 8760 hours.  Lipid Profile: No results for input(s): CHOL, HDL, LDLCALC, TRIG, CHOLHDL, LDLDIRECT in the last 72 hours.  Thyroid Function Tests: Recent Labs    04/26/21 0220  TSH 0.698    Anemia Panel: No results for input(s): VITAMINB12, FOLATE, FERRITIN, TIBC, IRON, RETICCTPCT in the last 72 hours.  Urine analysis:    Component Value Date/Time   COLORURINE YELLOW 04/26/2021 0240   APPEARANCEUR CLEAR 04/26/2021 0240   APPEARANCEUR Cloudy (A) 04/23/2019 1141   LABSPEC >1.030 (H) 04/26/2021 0240   PHURINE 5.5 04/26/2021 0240   GLUCOSEU NEGATIVE 04/26/2021 0240   GLUCOSEU NEGATIVE 08/18/2020 1346   HGBUR NEGATIVE 04/26/2021 0240   BILIRUBINUR MODERATE (A) 04/26/2021 0240   BILIRUBINUR negative 07/14/2019 1342   BILIRUBINUR Negative 04/23/2019 1141   KETONESUR 15 (A) 04/26/2021 0240   PROTEINUR NEGATIVE 04/26/2021 0240   UROBILINOGEN 0.2 08/18/2020 1346   NITRITE NEGATIVE 04/26/2021 0240   LEUKOCYTESUR NEGATIVE 04/26/2021 0240    Sepsis Labs: Lactic Acid, Venous    Component Value Date/Time   LATICACIDVEN 2.5 (HH) 04/26/2021 0700    MICROBIOLOGY: Recent Results (from the past 240 hour(s))  Resp Panel by RT-PCR (Flu A&B, Covid) Nasopharyngeal Swab     Status: None   Collection Time: 04/25/21  7:10 PM   Specimen: Nasopharyngeal Swab; Nasopharyngeal(NP) swabs in vial transport medium  Result Value Ref Range Status   SARS Coronavirus 2 by RT PCR NEGATIVE NEGATIVE Final    Comment:  (NOTE) SARS-CoV-2 target nucleic acids are NOT DETECTED.  The SARS-CoV-2 RNA is generally detectable in upper respiratory specimens during the acute phase of infection. The lowest concentration of SARS-CoV-2 viral copies this assay can detect is 138 copies/mL. A negative result does not preclude SARS-Cov-2 infection and should not be used as the sole basis for treatment or other patient management decisions. A negative result may occur with  improper specimen collection/handling, submission of specimen other than nasopharyngeal swab, presence of viral mutation(s) within the areas targeted by this assay, and inadequate number of viral copies(<138 copies/mL). A negative result must be combined with clinical observations, patient history, and epidemiological information. The expected result is Negative.  Fact Sheet for Patients:  EntrepreneurPulse.com.au  Fact Sheet for Healthcare Providers:  IncredibleEmployment.be  This test is no t yet approved or cleared by the [place] FDA and  has been authorized for detection and/or diagnosis of SARS-CoV-2 by FDA under an Emergency Use Authorization (EUA). This EUA will remain  in effect (meaning this test can be used) for the duration of the COVID-19 declaration under Section 564(b)(1) of the Act, 21 U.S.C.section 360bbb-3(b)(1), unless the authorization is terminated  or revoked sooner.       Influenza A by PCR NEGATIVE NEGATIVE Final   Influenza B by PCR NEGATIVE NEGATIVE Final    Comment: (NOTE) The Xpert Xpress SARS-CoV-2/FLU/RSV plus assay is intended as an aid in the diagnosis of influenza from Nasopharyngeal swab specimens and should not be used as a sole basis for treatment. Nasal washings and aspirates are unacceptable for Xpert Xpress SARS-CoV-2/FLU/RSV testing.  Fact Sheet for Patients: EntrepreneurPulse.com.au  Fact Sheet for Healthcare  Providers: IncredibleEmployment.be  This test is not yet approved or cleared by the [place] FDA and has been authorized for detection and/or diagnosis of SARS-CoV-2 by FDA under an Emergency Use Authorization (EUA). This EUA will  remain in effect (meaning this test can be used) for the duration of the COVID-19 declaration under Section 564(b)(1) of the Act, 21 U.S.C. section 360bbb-3(b)(1), unless the authorization is terminated or revoked.  Performed at [facility], [place] [address], [place], [place] [address]   Respiratory (~20 pathogens) panel by PCR     Status: None   Collection Time: 04/25/21 11:27 PM   Specimen: Nasopharyngeal Swab; Respiratory  Result Value Ref Range Status   Adenovirus NOT DETECTED NOT DETECTED Final   Coronavirus 229E NOT DETECTED NOT DETECTED Final    Comment: (NOTE) The Coronavirus on the Respiratory Panel, DOES NOT test for the novel  Coronavirus (2019 nCoV)    Coronavirus HKU1 NOT DETECTED NOT DETECTED Final   Coronavirus NL63 NOT DETECTED NOT DETECTED Final   Coronavirus OC43 NOT DETECTED NOT DETECTED Final   Metapneumovirus NOT DETECTED NOT DETECTED Final   Rhinovirus / Enterovirus NOT DETECTED NOT DETECTED Final   Influenza A NOT DETECTED NOT DETECTED Final   Influenza B NOT DETECTED NOT DETECTED Final   Parainfluenza Virus 1 NOT DETECTED NOT DETECTED Final   Parainfluenza Virus 2 NOT DETECTED NOT DETECTED Final   Parainfluenza Virus 3 NOT DETECTED NOT DETECTED Final   Parainfluenza Virus 4 NOT DETECTED NOT DETECTED Final   Respiratory Syncytial Virus NOT DETECTED NOT DETECTED Final   Bordetella pertussis NOT DETECTED NOT DETECTED Final   Bordetella Parapertussis NOT DETECTED NOT DETECTED Final   Chlamydophila pneumoniae NOT DETECTED NOT DETECTED Final   Mycoplasma pneumoniae NOT DETECTED NOT DETECTED Final    Comment: Performed at [facility], [place]. [address], [place], [place] [address]  MRSA Next Gen by  PCR, Nasal     Status: None   Collection Time: 04/26/21 12:08 AM  Result Value Ref Range Status   MRSA by PCR Next Gen NOT DETECTED NOT DETECTED Final    Comment: (NOTE) The GeneXpert MRSA Assay (FDA approved for NASAL specimens only), is one component of a comprehensive MRSA colonization surveillance program. It is not intended to diagnose MRSA infection nor to guide or monitor treatment for MRSA infections. Test performance is not FDA approved in patients less than 51 years old. Performed at [facility], [place] [address], [place], [place] [address]   Culture, blood (x 2)     Status: None (Preliminary result)   Collection Time: 04/26/21  2:20 AM   Specimen: BLOOD  Result Value Ref Range Status   Specimen Description BLOOD BLOOD RIGHT WRIST  Final   Special Requests   Final    BOTTLES DRAWN AEROBIC AND ANAEROBIC Blood Culture adequate volume   Culture   Final    NO GROWTH < 12 HOURS Performed at [facility], [place] [address], [place], [place] [address]    Report Status PENDING  Incomplete  Culture, blood (x 2)     Status: None (Preliminary result)   Collection Time: 04/26/21  2:20 AM   Specimen: BLOOD RIGHT HAND  Result Value Ref Range Status   Specimen Description BLOOD RIGHT HAND  Final   Special Requests   Final    BOTTLES DRAWN AEROBIC AND ANAEROBIC Blood Culture adequate volume   Culture   Final    NO GROWTH < 12 HOURS Performed at [facility], [place] [address], [place],  [address]    Report Status PENDING  Incomplete  Expectorated Sputum Assessment w Gram Stain, Rflx to Resp Cult     Status:  None   Collection Time: 04/26/21  2:35 AM   Specimen: Sputum  Result Value Ref Range Status   Specimen Description SPUTUM  Final   Special Requests NONE  Final   Sputum evaluation   Final    THIS SPECIMEN IS ACCEPTABLE FOR SPUTUM CULTURE Performed at [facility], [address], [place], [place] [address]    Report Status 04/26/2021 FINAL  Final   Culture, Respiratory w Gram Stain     Status: None (Preliminary result)   Collection Time: 04/26/21  2:35 AM   Specimen: SPU  Result Value Ref Range Status   Specimen Description SPUTUM  Final   Special Requests NONE Reflexed from [id number]  Final   Gram Stain   Final    ABUNDANT WBC PRESENT, PREDOMINANTLY PMN MODERATE GRAM NEGATIVE COCCI FEW GRAM POSITIVE COCCI IN PAIRS Performed at [facility], [place] [address], [place], [place] [address]    Culture PENDING  Incomplete   Report Status PENDING  Incomplete    RADIOLOGY STUDIES/RESULTS: CT ABDOMEN PELVIS W CONTRAST  Result Date: 04/26/2021 CLINICAL DATA:  55 year old female with history of left lower quadrant abdominal pain. EXAM: CT ABDOMEN AND PELVIS WITH CONTRAST TECHNIQUE: Multidetector CT imaging of the abdomen and pelvis was performed using the standard protocol following bolus administration of intravenous contrast. CONTRAST:  66mL OMNIPAQUE IOHEXOL 300 MG/ML  SOLN COMPARISON:  CT the abdomen and pelvis 10/18/2020. FINDINGS: Lower chest: Patchy peribronchovascular ground-glass attenuation and nodular airspace disease noted in the periphery of the lung bases bilaterally. Hepatobiliary: Diffuse low attenuation throughout the hepatic parenchyma, suggestive of a background of hepatic steatosis. No suspicious cystic or solid hepatic lesions. No intra or extrahepatic biliary ductal dilatation. Status post cholecystectomy. Pancreas: No pancreatic mass. No pancreatic ductal dilatation. No pancreatic or peripancreatic fluid collections or inflammatory changes. Spleen: Unremarkable. Adrenals/Urinary Tract: Bilateral kidneys and adrenal glands are normal in appearance. No hydroureteronephrosis. Urinary bladder is normal in appearance. Stomach/Bowel: The appearance of the stomach is normal. No pathologic dilatation of small bowel or colon. Numerous colonic diverticulae are noted, particularly in the sigmoid colon, without surrounding inflammatory  changes to suggest an acute diverticulitis at this time. Normal appendix. Vascular/Lymphatic: Aortic atherosclerosis. No aneurysm or dissection noted in the abdominal or pelvic vasculature. No lymphadenopathy noted in the abdomen or pelvis. Reproductive: Status post hysterectomy. Ovaries are not confidently identified may be surgically absent or atrophic. Other: No significant volume of ascites.  No pneumoperitoneum. Musculoskeletal: There are no aggressive appearing lytic or blastic lesions noted in the visualized portions of the skeleton. IMPRESSION: 1. No acute findings are noted in the abdomen or pelvis to account for the patient's symptoms. 2. There is extensive colonic diverticulosis, particularly in the sigmoid colon, but no definitive imaging findings to clearly indicate an acute diverticulitis at this time. 3. Probable hepatic steatosis. 4. Aortic atherosclerosis. 5. Findings in the lung bases which may suggest multilobar bilateral bronchopneumonia. Correlation with chest x-ray is recommended. Electronically Signed   By: Vinnie Langton M.D.   On: 04/26/2021 06:53   DG Chest Portable 1 View  Result Date: 04/25/2021 CLINICAL DATA:  Short of breath, headache EXAM: PORTABLE CHEST 1 VIEW COMPARISON:  10/18/2020 FINDINGS: Single frontal view of the chest demonstrates an unremarkable cardiac silhouette. Chronic background interstitial scarring without acute airspace disease, effusion, or pneumothorax. No acute bony abnormalities. IMPRESSION: 1. No acute intrathoracic process. Electronically Signed   By: Randa Ngo M.D.   On: 04/25/2021 20:26  LOS: 1 day   Oren Binet, MD  [facility]    To contact the attending provider between 7A-7P or the covering provider during after hours 7P-7A, please log into the web site [email or web address] and access using universal [place] password for that web site. If you do not have the password, please call the hospital operator.  04/26/2021, 12:55  PM

## 2021-04-26 NOTE — Progress Notes (Signed)
Pt. Unable to wear the bipap at this time due to excessive coughing. Bipap will remain on standby until pt. Coughing subsides. RN aware.pt. sats are stable in the 90's. Pt. Does not appear to be in distress at this time.

## 2021-04-26 NOTE — Consult Note (Signed)
NAME:  Vicki [name], MRN:  [id number], DOB:  12-Dec-1965, LOS: 1 ADMISSION DATE:  04/25/2021, CONSULTATION DATE:  12/14 REFERRING MD:  Sloan Leiter, CHIEF COMPLAINT:  acute on chronic hypoxic respiratory failure    History of Present Illness:  55 year old smoker w/ chronic hypoxic respiratory failure (2.5l at HS) 2/2 COPD. Was in [place] until around 12/12 when started to note new onset intermittent fevers, NP cough and progressive SOB. The SOB progressed to point of significant exertional dyspnea, almost to point of resting dyspnea. Increased oxygen up to 4 lpm wearing essentially 24/7 to keep sats > 90, taking SABA at escalated dosing w/ minimal relief. Additional associated sxs including Nausea and diarrhea.   Presented to ER w/ above complaints.  Initial ER findings:  (+) fever, tachycardic, increased accessory use w/ inc'd WOB. O2 sats 90% on 4lpm. Speaking 3-4 word phrases.  -minimal wbc elevation 10.7,RVP negative, MRSA PCR, neg, PCT 0.33, lactate 2.3, PCXR w/ mild acute on chronic interstitial changes.  CT abd obtained to eval Nausea/diarrhea was negative for GI pathology BUT did show multilobar bibasilar airspace disease R>L  Pertinent  Medical History  Anxiety, Environmental allergies, childhood asthma, Depression COPD/emphysema, HTN, HL, chronic respiratory failure on 2-2.5 l at home. DDD, Cdiff, aortic atherosclerosis, RLS, tobacco abuse, Multiple drug allergies  Significant Hospital Events: Including procedures, antibiotic start and stop dates in addition to other pertinent events   12/14 admitted w/ working dx of CAP and sepsis. Cultures sent. Started on ceftriaxone and flagyl. Placed on O2, intolerant of BIPAP 2/2 cough, O2 req increased to 6 lpm. PCCM asked to eval   Interim History / Subjective:  "Feels terrible"  Objective   Blood pressure 110/74, pulse 88, temperature 98.7 F (37.1 C), temperature source Oral, resp. rate 18, SpO2 97 %.        Intake/Output Summary  (Last 24 hours) at 04/26/2021 0717 Last data filed at 04/26/2021 0500 Gross per 24 hour  Intake 747.21 ml  Output 200 ml  Net 547.21 ml   There were no vitals filed for this visit.  Examination: General: Acutely ill-appearing 55 year old white female lying in bed currently fairly uncomfortable with persistent cough HENT: Normocephalic atraumatic mucous membranes moist prominent upper airway wheeze Lungs: Diffuse wheezing and rhonchi currently 6 L/min with accessory use noted Cardiovascular: Tachycardic regular rhythm Abdomen: Soft not tender Extremities: Warm dry Neuro: Awake oriented GU: Due to void  Resolved Hospital Problem list    Assessment & Plan:  CAP AECOPD Acute on Chronic hypoxic respiratory failure Tobacco abuse  Sepsis  Nausea and diarrhea  Mild lactic acidosis  HTN   Pulm/CCM problem list   Acute on chronic Hypoxic respiratory failure 2/2 CAP complicated by AECOPD in active smoker.  Current O2 needs: 6 lpm Plan Cont ceftriaxone  Add azithro Send U strep and legionella studies Cont supplemental oxygen  Cont systemic steroids Repeat am CXR  Increase PPI  Best Practice (right click and "Reselect all SmartList Selections" daily)  Per primary  Labs   CBC: Recent Labs  Lab 04/25/21 1946 04/26/21 0006 04/26/21 0129 04/26/21 0337  WBC 10.7*  --  6.7  --   NEUTROABS  --   --  6.0  --   HGB 13.3 13.6 12.7 13.3  HCT 40.8 40.0 38.9 39.0  MCV 97.6  --  98.2  --   PLT 296  --  255  --     Basic Metabolic Panel: Recent Labs  Lab 04/25/21 1946 04/26/21 0006  04/26/21 0129 04/26/21 0337  NA 136 134* 133* 134*  K 3.5 2.8* 3.2* 3.2*  CL 93*  --  94*  --   CO2 30  --  27  --   GLUCOSE 96  --  211*  --   BUN 18  --  19  --   CREATININE 1.10*  --  1.02*  --   CALCIUM 8.6*  --  8.4*  --   MG  --   --  2.6*  --   PHOS  --   --  4.5  --    GFR: Estimated Creatinine Clearance: 62 mL/min (A) (by C-G formula based on SCr of 1.02 mg/dL (H)). Recent  Labs  Lab 04/25/21 1946 04/26/21 0129 04/26/21 0220  PROCALCITON  --  0.33  --   WBC 10.7* 6.7  --   LATICACIDVEN  --   --  2.3*    Liver Function Tests: Recent Labs  Lab 04/25/21 1946 04/26/21 0129  AST 36 35  ALT 32 31  ALKPHOS 153* 158*  BILITOT 0.9 0.5  PROT 7.2 7.0  ALBUMIN 3.3* 3.0*   No results for input(s): LIPASE, AMYLASE in the last 168 hours. No results for input(s): AMMONIA in the last 168 hours.  ABG    Component Value Date/Time   HCO3 29.5 (H) 04/26/2021 0337   TCO2 31 04/26/2021 0337   O2SAT 100.0 04/26/2021 0337     Coagulation Profile: No results for input(s): INR, PROTIME in the last 168 hours.  Cardiac Enzymes: No results for input(s): CKTOTAL, CKMB, CKMBINDEX, TROPONINI in the last 168 hours.  HbA1C: Hgb A1c MFr Bld  Date/Time Value Ref Range Status  08/18/2020 01:46 PM 5.4 4.6 - 6.5 % Final    Comment:    Glycemic Control Guidelines for People with Diabetes:Non Diabetic:  <6%Goal of Therapy: <7%Additional Action Suggested:  >8%   01/17/2018 10:27 AM 5.5 4.8 - 5.6 % Final    Comment:             Prediabetes: 5.7 - 6.4          Diabetes: >6.4          Glycemic control for adults with diabetes: <7.0     CBG: No results for input(s): GLUCAP in the last 168 hours.  Review of Systems:   Review of Systems  Constitutional:  Positive for fever and malaise/fatigue.  HENT:  Positive for congestion and sore throat.   Eyes: Negative.   Respiratory:  Positive for cough, sputum production, shortness of breath and wheezing.   Cardiovascular: Negative.   Gastrointestinal:  Positive for diarrhea, heartburn and nausea.  Genitourinary: Negative.   Musculoskeletal: Negative.   Skin: Negative.   Neurological: Negative.   Endo/Heme/Allergies: Negative.   Psychiatric/Behavioral: Negative.      Past Medical History:  She,  has a past medical history of Allergy, Anxiety, Arthritis, Asthma, CAP (community acquired pneumonia) (04/07/2019), Carpal  tunnel syndrome, Cataract, COPD (chronic obstructive pulmonary disease) ([place]), Depression, Diverticulitis, Emphysema of lung ([place]), Essential hypertension, benign (07/14/2019), Hyperlipidemia, Oxygen deficiency, and Oxygen dependent.   Surgical History:   Past Surgical History:  Procedure Laterality Date   ABDOMINAL HYSTERECTOMY  2009   APPENDECTOMY     ARTHROSCOPIC REPAIR ACL Left 2011/2014   x 2   BIOPSY  01/21/2020   Procedure: BIOPSY;  Surgeon: Mauri Pole, MD;  Location: [facility];  Service: Endoscopy;;  EGD and COLON   BUNIONECTOMY  2006   both  CARPAL TUNNEL RELEASE Bilateral 10/28/2013   Procedure: BILATERAL CARPAL TUNNEL RELEASE;  Surgeon: Wynonia Sours, MD;  Location: [place] [facility];  Service: Orthopedics;  Laterality: Bilateral;   CHOLECYSTECTOMY  1994   CHONDROPLASTY Right 08/31/2020   Procedure: CHONDROPLASTY;  Surgeon: Earlie Server, MD;  Location: [place];  Service: Orthopedics;  Laterality: Right;   COLONOSCOPY WITH PROPOFOL N/A 01/21/2020   Procedure: COLONOSCOPY WITH PROPOFOL;  Surgeon: Mauri Pole, MD;  Location: [facility];  Service: Endoscopy;  Laterality: N/A;   DILATION AND CURETTAGE OF UTERUS     ESOPHAGOGASTRODUODENOSCOPY (EGD) WITH PROPOFOL N/A 01/21/2020   Procedure: ESOPHAGOGASTRODUODENOSCOPY (EGD) WITH PROPOFOL;  Surgeon: Mauri Pole, MD;  Location: [facility];  Service: Endoscopy;  Laterality: N/A;   KNEE ARTHROSCOPY WITH ANTERIOR CRUCIATE LIGAMENT (ACL) REPAIR Right 08/31/2020   Procedure: RIGHT KNEE ARTHROSCOPY WITH ANTERIOR CRUCIATE LIGAMENT (ACL) REPAIR MEDIAL AND LATERAL  MENISECTOMY;  Surgeon: Earlie Server, MD;  Location: [place];  Service: Orthopedics;  Laterality: Right;  FEMORAL NERVE BLOCK   POLYPECTOMY  01/21/2020   Procedure: POLYPECTOMY;  Surgeon: Mauri Pole, MD;  Location: [facility];  Service: Endoscopy;;   TUBAL LIGATION  2004     Social History:   reports  that she has been smoking cigarettes. She has a 34.00 pack-year smoking history. She has never used smokeless tobacco. She reports current alcohol use. She reports that she does not use drugs.   Family History:  Her family history includes Brain cancer (age of onset: 35) in her mother; Breast cancer (age of onset: 77) in her sister; Lung cancer (age of onset: 11) in her mother; Ovarian cancer (age of onset: 62) in her sister. There is no history of Colon cancer, Colon polyps, Esophageal cancer, Rectal cancer, or Stomach cancer.   Allergies Allergies  Allergen Reactions   Jasmine Oil Shortness Of Breath and Swelling   Levaquin [Levofloxacin] Shortness Of Breath, Itching, Swelling and Rash   Penicillins Anaphylaxis and Hives    Tolerated ceftriaxone   Aleve [Naproxen Sodium] Itching   Ceftin [Cefuroxime Axetil] Other (See Comments)    unsure   Depo-Provera [Medroxyprogesterone Acetate] Other (See Comments)    Severe acne   Doxycycline Other (See Comments)    Raises blood pressure    Oxycodone Nausea And Vomiting   Tramadol Other (See Comments)    Kept her up all night     Home Medications  Prior to Admission medications   Medication Sig Start Date End Date Taking? Authorizing Provider  acetaminophen (TYLENOL) 500 MG tablet Take 1,000 mg by mouth every 6 (six) hours as needed for moderate pain.   Yes [provider]  albuterol (VENTOLIN HFA) 108 (90 Base) MCG/ACT inhaler Inhale 2 puffs into the lungs 2 (two) times daily. 06/13/20  Yes Maximiano Coss, NP  ALPRAZolam (XANAX) 1 MG tablet TAKE 1 TABLET BY MOUTH 3 TIMES DAILY AS NEEDED FOR ANXIETY. Patient taking differently: Take 1 mg by mouth 3 (three) times daily as needed for anxiety. 04/10/21  Yes Maximiano Coss, NP  amLODipine (NORVASC) 5 MG tablet TAKE 1 TABLET (5 MG TOTAL) BY MOUTH DAILY. 03/28/21  Yes Maximiano Coss, NP  ASPIRIN LOW DOSE 81 MG EC tablet TAKE 1 TABLET BY MOUTH 2 TIMES DAILY. Patient taking differently:  Take 81 mg by mouth in the morning and at bedtime. 03/21/21  Yes Maximiano Coss, NP  azelastine (ASTELIN) 0.1 % nasal spray Place 1 spray into both nostrils 2 (two) times  daily. Use in each nostril as directed 10/27/20  Yes Maximiano Coss, NP  cetirizine (ZYRTEC) 10 MG tablet Take 1 tablet (10 mg total) by mouth daily. 09/14/20  Yes Maximiano Coss, NP  citalopram (CELEXA) 20 MG tablet Take 3 tablets (60 mg total) by mouth daily. 10/21/20  Yes Florencia Reasons, MD  eletriptan (RELPAX) 40 MG tablet TAKE 1 TABLET AT ONSET OF MIGRAINE OR HEADACHE AS NEEDED. MAY REPEAT IN 2 HOURS IF SYMPTOMS PERSIST. Patient taking differently: Take 40 mg by mouth as directed. Take 1 tablet at onset of Migraine or Headache as needed. May repeat in 2 hours if symptoms persist. 01/12/21  Yes Maximiano Coss, NP  EPINEPHrine (EPIPEN 2-PAK) 0.3 mg/0.3 mL IJ SOAJ injection Inject 0.3 mg into the muscle as needed for anaphylaxis. 12/27/20  Yes Maximiano Coss, NP  famotidine (PEPCID) 20 MG tablet TAKE 1 TABLET BY MOUTH TWICE A DAY (MORNING AND NIGHT) Patient taking differently: Take 20 mg by mouth 2 (two) times daily at 8 am and 10 pm. 11/07/20  Yes Valentina Shaggy, MD  fexofenadine (ALLEGRA) 180 MG tablet Take 1 tablet (180 mg total) by mouth daily. 10/21/20  Yes Florencia Reasons, MD  gabapentin (NEURONTIN) 300 MG capsule Take 1 capsule (300 mg total) by mouth 2 (two) times daily. 06/13/20  Yes Maximiano Coss, NP  hydrOXYzine (ATARAX/VISTARIL) 25 MG tablet Take 0.5-1 tablets (12.5-25 mg total) by mouth 3 (three) times daily as needed. Patient taking differently: Take 12.5-25 mg by mouth 3 (three) times daily as needed for anxiety. 01/12/21  Yes Maximiano Coss, NP  meclizine (ANTIVERT) 25 MG tablet Take 1 tablet (25 mg total) by mouth 3 (three) times daily as needed for dizziness. 12/27/20  Yes Maximiano Coss, NP  Menthol, Topical Analgesic, (BIOFREEZE EX) Apply 1 application topically daily as needed (back pain).   Yes [provider]   montelukast (SINGULAIR) 10 MG tablet TAKE 1 TABLET BY MOUTH EVERYDAY AT BEDTIME Patient taking differently: Take 10 mg by mouth at bedtime. 01/20/21  Yes Maximiano Coss, NP  omeprazole (PRILOSEC) 20 MG capsule Take 1 capsule (20 mg total) by mouth 2 (two) times daily. Take 1 capsule (20 mg total) by mouth 2 (two) times daily. 01/11/21 04/25/21 Yes Maximiano Coss, NP  ondansetron (ZOFRAN) 8 MG tablet Take 1 tablet (8 mg total) by mouth every 8 (eight) hours as needed for nausea or vomiting. 01/12/21  Yes Maximiano Coss, NP  prazosin (MINIPRESS) 1 MG capsule TAKE 1 TO 2 CAPSULES (1 TO 2 MG TOTAL) BY MOUTH AT BEDTIME. Patient taking differently: Take 1-2 mg by mouth at bedtime. 12/27/20  Yes Maximiano Coss, NP  promethazine (PHENERGAN) 25 MG tablet Take 1 tablet (25 mg total) by mouth every 8 (eight) hours as needed for nausea or vomiting. 12/27/20  Yes Maximiano Coss, NP  rOPINIRole (REQUIP) 2 MG tablet Take 0.5-1 tablets (1-2 mg total) by mouth at bedtime. 12/27/20  Yes Maximiano Coss, NP  STIOLTO RESPIMAT 2.5-2.5 MCG/ACT AERS Inhale 1 puff into the lungs daily. 04/21/21  Yes [provider]  traZODone (DESYREL) 50 MG tablet Take 1 tablet (50 mg total) by mouth at bedtime. 10/21/20  Yes Florencia Reasons, MD  umeclidinium bromide (INCRUSE ELLIPTA) 62.5 MCG/INH AEPB Inhale 1 puff into the lungs daily. 10/21/20  Yes Florencia Reasons, MD  dapsone 25 MG tablet TAKE 1 TABLET BY MOUTH DAILY FOR 14 DAYS, THEN 2 TABLETS DAILY. Patient not taking: Reported on 04/25/2021 03/02/21   Maximiano Coss, NP  Fluticasone-Salmeterol (ADVAIR DISKUS)  500-50 MCG/DOSE AEPB Inhale 1 puff into the lungs in the morning and at bedtime. Patient not taking: Reported on 04/25/2021 06/13/20   Maximiano Coss, NP  HYDROcodone-acetaminophen (NORCO) 7.5-325 MG tablet Take 1 tablet by mouth every 4 (four) hours as needed for moderate pain. Patient not taking: Reported on 04/25/2021 08/31/20   Chriss Czar, PA-C  triamcinolone cream (KENALOG)  0.1 % Apply 1 application topically 2 (two) times daily. Patient not taking: Reported on 04/25/2021 09/14/20   Maximiano Coss, NP     Critical care time: NA   Erick Colace ACNP-BC [place] Pager # [phone] OR # [phone] if no answer

## 2021-04-26 NOTE — ED Notes (Signed)
Pt noted to have decreased O2 saturation on BiPAP. RN at beside and pt noted to be coughing and unable to tolerate mask. BiPAP removed and pt placed on 15L O2 via non-rebreather and weaned to 6L O2 via nasal cannula. Ghimire, MD notified of the same.

## 2021-04-26 NOTE — Assessment & Plan Note (Signed)
Hold home meds given soft bp

## 2021-04-26 NOTE — Assessment & Plan Note (Signed)
-   Order gastric panel and C. difficile testing CT abdomen to evaluate for any evidence of colitis.  Given SIRS empirically covered Rocephin and metronidazole for tonight

## 2021-04-27 ENCOUNTER — Other Ambulatory Visit (HOSPITAL_COMMUNITY): Payer: Self-pay

## 2021-04-27 LAB — COMPREHENSIVE METABOLIC PANEL
ALT: 20 U/L (ref 0–44)
AST: 19 U/L (ref 15–41)
Albumin: 2.5 g/dL — ABNORMAL LOW (ref 3.5–5.0)
Alkaline Phosphatase: 133 U/L — ABNORMAL HIGH (ref 38–126)
Anion gap: 8 (ref 5–15)
BUN: 16 mg/dL (ref 6–20)
CO2: 26 mmol/L (ref 22–32)
Calcium: 8.4 mg/dL — ABNORMAL LOW (ref 8.9–10.3)
Chloride: 100 mmol/L (ref 98–111)
Creatinine, Ser: 0.95 mg/dL (ref 0.44–1.00)
GFR, Estimated: >60 mL/min (ref >60)
Glucose, Bld: 194 mg/dL — ABNORMAL HIGH (ref 70–99)
Potassium: 4.2 mmol/L (ref 3.5–5.1)
Sodium: 134 mmol/L — ABNORMAL LOW (ref 135–145)
Total Bilirubin: 0.5 mg/dL (ref 0.3–1.2)
Total Protein: 5.9 g/dL — ABNORMAL LOW (ref 6.5–8.1)

## 2021-04-27 LAB — GASTROINTESTINAL PANEL BY PCR, STOOL (REPLACES STOOL CULTURE)

## 2021-04-27 LAB — CBC
HCT: 36 % (ref 36.0–46.0)
Hemoglobin: 11.5 g/dL — ABNORMAL LOW (ref 12.0–15.0)
MCH: 31.1 pg (ref 26.0–34.0)
MCHC: 31.9 g/dL (ref 30.0–36.0)
MCV: 97.3 fL (ref 80.0–100.0)
Platelets: 254 10*3/uL (ref 150–400)
RBC: 3.7 MIL/uL — ABNORMAL LOW (ref 3.87–5.11)
RDW: 14.7 % (ref 11.5–15.5)
WBC: 11.3 10*3/uL — ABNORMAL HIGH (ref 4.0–10.5)
nRBC: 0 % (ref 0.0–0.2)

## 2021-04-27 LAB — MAGNESIUM: Magnesium: 2.2 mg/dL (ref 1.7–2.4)

## 2021-04-27 MED ORDER — PANTOPRAZOLE SODIUM 40 MG PO TBEC
40.0000 mg | DELAYED_RELEASE_TABLET | Freq: Two times a day (BID) | ORAL | Status: DC
  Administered 2021-04-27 – 2021-05-01 (×8): 40 mg via ORAL
  Filled 2021-04-27 (×8): qty 1

## 2021-04-27 MED ORDER — ADULT MULTIVITAMIN W/MINERALS CH
1.0000 | ORAL_TABLET | Freq: Every day | ORAL | Status: DC
  Administered 2021-04-27 – 2021-04-30 (×4): 1 via ORAL
  Filled 2021-04-27 (×4): qty 1

## 2021-04-27 MED ORDER — VANCOMYCIN HCL 125 MG PO CAPS
125.0000 mg | ORAL_CAPSULE | Freq: Four times a day (QID) | ORAL | Status: DC
  Administered 2021-04-27 – 2021-04-30 (×16): 125 mg via ORAL
  Filled 2021-04-27 (×20): qty 1

## 2021-04-27 MED ORDER — ENSURE ENLIVE PO LIQD
237.0000 mL | Freq: Two times a day (BID) | ORAL | Status: DC
  Administered 2021-04-28 – 2021-04-30 (×3): 237 mL via ORAL

## 2021-04-27 NOTE — Telephone Encounter (Signed)
Attempted to call patient

## 2021-04-27 NOTE — Plan of Care (Signed)
°  Problem: Education: Goal: Knowledge of General Education information will improve Description: Including pain rating scale, medication(s)/side effects and non-pharmacologic comfort measures Outcome: Progressing   Problem: Health Behavior/Discharge Planning: Goal: Ability to manage health-related needs will improve Outcome: Progressing   Problem: Coping: Goal: Level of anxiety will decrease Outcome: Progressing   Problem: Safety: Goal: Ability to remain free from injury will improve Outcome: Progressing   Problem: Skin Integrity: Goal: Risk for impaired skin integrity will decrease Outcome: Progressing   

## 2021-04-27 NOTE — Progress Notes (Signed)
PHARMACIST - PHYSICIAN COMMUNICATION  DR:   Sloan Leiter  CONCERNING: IV to Oral Route Change Policy  RECOMMENDATION: This patient is receiving protonix by the intravenous route.  Based on criteria approved by the Pharmacy and Therapeutics Committee, the intravenous medication(s) is/are being converted to the equivalent oral dose form(s).   DESCRIPTION: These criteria include: The patient is eating (either orally or via tube) and/or has been taking other orally administered medications for a least 24 hours The patient has no evidence of active gastrointestinal bleeding or impaired GI absorption (gastrectomy, short bowel, patient on TNA or NPO).  If you have questions about this conversion, please contact the Pharmacy Department  []   [phone] )  [facility] []   [phone] )  [facility] [x]   [phone] )  [facility] []   [phone] )  [facility] []   [phone] )  [place], PharmD, BCPS Clinical Pharmacist 04/27/2021 9:08 AM

## 2021-04-27 NOTE — Progress Notes (Signed)
PROGRESS NOTE        PATIENT DETAILS Name: Vicki [name] Age: 55 y.o. Sex: female Date of Birth: [birth year] Admit Date: 04/25/2021 Admitting Physician Toy Baker, MD HQI:ONGEXB, Richard, NP  Brief Narrative: Patient is a 55 y.o. female with history of COPD on home O2 who presented with cough, worsening shortness of breath for the past 3-4 days-she was found to have COPD exacerbation and community-acquired pneumonia and subsequently admitted to the hospitalist service.  See below for further details.  Subjective: Breathing is better still wheezing-diarrhea continues but much better-claims still having loose stools-on 3-4 times yesterday-twice already this morning when I saw her.  Objective: Vitals: Blood pressure 96/68, pulse 81, temperature 98 F (36.7 C), temperature source Oral, resp. rate 17, weight 76.3 kg, SpO2 92 %.   Exam: Gen Exam:Alert awake-not in any distress HEENT:atraumatic, normocephalic Chest: Moving air-still wheezing. CVS:S1S2 regular Abdomen:soft non tender, non distended Extremities:no edema Neurology: Non focal Skin: no rash   Pertinent Labs/Radiology: Recent Labs  Lab 04/25/21 1946 04/26/21 0006 04/26/21 0129 04/26/21 0337 04/27/21 0152  WBC 10.7*  --  6.7  --  11.3*  HGB 13.3   < > 12.7   < > 11.5*  PLT 296  --  255  --  254  NA 136   < > 133*   < > 134*  K 3.5   < > 3.2*   < > 4.2  CREATININE 1.10*  --  1.02*  --  0.95  AST 36  --  35  --  19  ALT 32  --  31  --  20  ALKPHOS 153*  --  158*  --  133*  BILITOT 0.9  --  0.5  --  0.5   < > = values in this interval not displayed.      Assessment/Plan: Acute on chronic hypoxic respiratory failure due to COPD exacerbation and community-acquired pneumonia: Overall better-coughing has improved-still wheezing-remains on 4-5 L of oxygen.  Continue Rocephin/Zithromax/bronchodilators and IV steroids.  Blood/sputum cultures remain negative so far-continue to  follow until final.  y.    SIRS due to community-acquired pneumonia: SIRS physiology improving-on empiric IV antibiotics  Acute urinary retention: Due to acute illness-we will attempt voiding trial today.    Diarrhea: Had improved-but still continues-seems to be slowing down-has a history of C. difficile earlier this year.  Since C. difficile studies a equivocal-we will go ahead and treat as C. difficile colitis-start oral vancomycin.  Hypokalemia: Repleted  HTN: BP stable-continue to hold all antihypertensives-resume when able.  AKI: Mild-likely hemodynamically mediated-creatinine has improved.  Restless leg syndrome: Continue Requip  Anxiety: Continue as needed Xanax/Celexa  BMI Estimated body mass index is 29.8 kg/m as calculated from the following:   Height as of an earlier encounter on 04/25/21: 5\' 3"  (1.6 m).   Weight as of this encounter: 76.3 kg.    Procedures: None Consults: PCCM. DVT Prophylaxis: Lovenox Code Status:Full code Family Communication: None at bedside  Time spent: 35 minutes-Greater than 50% of this time was spent in counseling, explanation of diagnosis, planning of further management, and coordination of care.   Disposition Plan: Status is: Inpatient  Remains inpatient appropriate because: Acute on chronic hypoxic respiratory failure due to COPD exacerbation provoked by PNA-on 5-6 L of oxygen (more than baseline) on IV antibiotics-not yet stable for discharge.  Diet: Diet Order             Diet Heart Room service appropriate? Yes; Fluid consistency: Thin  Diet effective now                     Antimicrobial agents: Anti-infectives (From admission, onward)    Start     Dose/Rate Route Frequency Ordered Stop   04/27/21 1130  vancomycin (VANCOCIN) capsule 125 mg        125 mg Oral 4 times daily 04/27/21 1038 05/07/21 0959   04/27/21 0000  cefTRIAXone (ROCEPHIN) 2 g in sodium chloride 0.9 % 100 mL IVPB        2 g 200 mL/hr over 30  Minutes Intravenous Every 24 hours 04/26/21 0125 05/01/21 2359   04/26/21 1000  azithromycin (ZITHROMAX) tablet 500 mg        500 mg Oral Daily 04/26/21 0832 04/29/21 0959   04/26/21 0200  metroNIDAZOLE (FLAGYL) IVPB 500 mg  Status:  Discontinued        500 mg 100 mL/hr over 60 Minutes Intravenous Every 8 hours 04/26/21 0140 04/26/21 0834   04/26/21 0130  cefTRIAXone (ROCEPHIN) 2 g in sodium chloride 0.9 % 100 mL IVPB        2 g 200 mL/hr over 30 Minutes Intravenous  Once 04/26/21 0129 04/26/21 0237        MEDICATIONS: Scheduled Meds:  azithromycin  500 mg Oral Daily   benzonatate  200 mg Oral TID   citalopram  60 mg Oral Daily   enoxaparin (LOVENOX) injection  40 mg Subcutaneous Q24H   fluticasone  2 spray Each Nare Daily   gabapentin  300 mg Oral BID   guaiFENesin  1,200 mg Oral BID   ipratropium-albuterol  3 mL Nebulization Q4H   loratadine  10 mg Oral Daily   methylPREDNISolone (SOLU-MEDROL) injection  60 mg Intravenous Q12H   montelukast  10 mg Oral QHS   oxymetazoline  1 spray Each Nare BID   pantoprazole  40 mg Oral BID AC   rOPINIRole  1-2 mg Oral QHS   vancomycin  125 mg Oral QID   Continuous Infusions:  cefTRIAXone (ROCEPHIN)  IV 2 g (04/26/21 2343)   PRN Meds:.acetaminophen **OR** acetaminophen, albuterol, ALPRAZolam, chlorpheniramine-HYDROcodone, HYDROcodone-acetaminophen, menthol-cetylpyridinium, ondansetron **OR** ondansetron (ZOFRAN) IV   I have personally reviewed following labs and imaging studies  LABORATORY DATA: CBC: Recent Labs  Lab 04/25/21 1946 04/26/21 0006 04/26/21 0129 04/26/21 0337 04/27/21 0152  WBC 10.7*  --  6.7  --  11.3*  NEUTROABS  --   --  6.0  --   --   HGB 13.3 13.6 12.7 13.3 11.5*  HCT 40.8 40.0 38.9 39.0 36.0  MCV 97.6  --  98.2  --  97.3  PLT 296  --  255  --  254     Basic Metabolic Panel: Recent Labs  Lab 04/25/21 1946 04/26/21 0006 04/26/21 0129 04/26/21 0337 04/27/21 0152  NA 136 134* 133* 134* 134*  K 3.5  2.8* 3.2* 3.2* 4.2  CL 93*  --  94*  --  100  CO2 30  --  27  --  26  GLUCOSE 96  --  211*  --  194*  BUN 18  --  19  --  16  CREATININE 1.10*  --  1.02*  --  0.95  CALCIUM 8.6*  --  8.4*  --  8.4*  MG  --   --  2.6*  --  2.2  PHOS  --   --  4.5  --   --      GFR: Estimated Creatinine Clearance: 67 mL/min (by C-G formula based on SCr of 0.95 mg/dL).  Liver Function Tests: Recent Labs  Lab 04/25/21 1946 04/26/21 0129 04/27/21 0152  AST 36 35 19  ALT 32 31 20  ALKPHOS 153* 158* 133*  BILITOT 0.9 0.5 0.5  PROT 7.2 7.0 5.9*  ALBUMIN 3.3* 3.0* 2.5*    No results for input(s): LIPASE, AMYLASE in the last 168 hours. No results for input(s): AMMONIA in the last 168 hours.  Coagulation Profile: No results for input(s): INR, PROTIME in the last 168 hours.  Cardiac Enzymes: No results for input(s): CKTOTAL, CKMB, CKMBINDEX, TROPONINI in the last 168 hours.  BNP (last 3 results) No results for input(s): PROBNP in the last 8760 hours.  Lipid Profile: No results for input(s): CHOL, HDL, LDLCALC, TRIG, CHOLHDL, LDLDIRECT in the last 72 hours.  Thyroid Function Tests: Recent Labs    04/26/21 0220  TSH 0.698     Anemia Panel: No results for input(s): VITAMINB12, FOLATE, FERRITIN, TIBC, IRON, RETICCTPCT in the last 72 hours.  Urine analysis:    Component Value Date/Time   COLORURINE YELLOW 04/26/2021 0240   APPEARANCEUR CLEAR 04/26/2021 0240   APPEARANCEUR Cloudy (A) 04/23/2019 1141   LABSPEC >1.030 (H) 04/26/2021 0240   PHURINE 5.5 04/26/2021 0240   GLUCOSEU NEGATIVE 04/26/2021 0240   GLUCOSEU NEGATIVE 08/18/2020 1346   HGBUR NEGATIVE 04/26/2021 0240   BILIRUBINUR MODERATE (A) 04/26/2021 0240   BILIRUBINUR negative 07/14/2019 1342   BILIRUBINUR Negative 04/23/2019 1141   KETONESUR 15 (A) 04/26/2021 0240   PROTEINUR NEGATIVE 04/26/2021 0240   UROBILINOGEN 0.2 08/18/2020 1346   NITRITE NEGATIVE 04/26/2021 0240   LEUKOCYTESUR NEGATIVE 04/26/2021 0240    Sepsis  Labs: Lactic Acid, Venous    Component Value Date/Time   LATICACIDVEN 2.5 (HH) 04/26/2021 0700    MICROBIOLOGY: Recent Results (from the past 240 hour(s))  Resp Panel by RT-PCR (Flu A&B, Covid) Nasopharyngeal Swab     Status: None   Collection Time: 04/25/21  7:10 PM   Specimen: Nasopharyngeal Swab; Nasopharyngeal(NP) swabs in vial transport medium  Result Value Ref Range Status   SARS Coronavirus 2 by RT PCR NEGATIVE NEGATIVE Final    Comment: (NOTE) SARS-CoV-2 target nucleic acids are NOT DETECTED.  The SARS-CoV-2 RNA is generally detectable in upper respiratory specimens during the acute phase of infection. The lowest concentration of SARS-CoV-2 viral copies this assay can detect is 138 copies/mL. A negative result does not preclude SARS-Cov-2 infection and should not be used as the sole basis for treatment or other patient management decisions. A negative result may occur with  improper specimen collection/handling, submission of specimen other than nasopharyngeal swab, presence of viral mutation(s) within the areas targeted by this assay, and inadequate number of viral copies(<138 copies/mL). A negative result must be combined with clinical observations, patient history, and epidemiological information. The expected result is Negative.  Fact Sheet for Patients:  EntrepreneurPulse.com.au  Fact Sheet for Healthcare Providers:  IncredibleEmployment.be  This test is no t yet approved or cleared by the [place] FDA and  has been authorized for detection and/or diagnosis of SARS-CoV-2 by FDA under an Emergency Use Authorization (EUA). This EUA will remain  in effect (meaning this test can be used) for the duration of the COVID-19 declaration under Section 564(b)(1) of the Act, 21 U.S.C.section 360bbb-3(b)(1), unless the authorization is terminated  or revoked sooner.       Influenza A by PCR NEGATIVE NEGATIVE Final   Influenza B by  PCR NEGATIVE NEGATIVE Final    Comment: (NOTE) The Xpert Xpress SARS-CoV-2/FLU/RSV plus assay is intended as an aid in the diagnosis of influenza from Nasopharyngeal swab specimens and should not be used as a sole basis for treatment. Nasal washings and aspirates are unacceptable for Xpert Xpress SARS-CoV-2/FLU/RSV testing.  Fact Sheet for Patients: EntrepreneurPulse.com.au  Fact Sheet for Healthcare Providers: IncredibleEmployment.be  This test is not yet approved or cleared by the [place] FDA and has been authorized for detection and/or diagnosis of SARS-CoV-2 by FDA under an Emergency Use Authorization (EUA). This EUA will remain in effect (meaning this test can be used) for the duration of the COVID-19 declaration under Section 564(b)(1) of the Act, 21 U.S.C. section 360bbb-3(b)(1), unless the authorization is terminated or revoked.  Performed at [facility], [place] [address], [place], [place] [address]   Respiratory (~20 pathogens) panel by PCR     Status: None   Collection Time: 04/25/21 11:27 PM   Specimen: Nasopharyngeal Swab; Respiratory  Result Value Ref Range Status   Adenovirus NOT DETECTED NOT DETECTED Final   Coronavirus 229E NOT DETECTED NOT DETECTED Final    Comment: (NOTE) The Coronavirus on the Respiratory Panel, DOES NOT test for the novel  Coronavirus (2019 nCoV)    Coronavirus HKU1 NOT DETECTED NOT DETECTED Final   Coronavirus NL63 NOT DETECTED NOT DETECTED Final   Coronavirus OC43 NOT DETECTED NOT DETECTED Final   Metapneumovirus NOT DETECTED NOT DETECTED Final   Rhinovirus / Enterovirus NOT DETECTED NOT DETECTED Final   Influenza A NOT DETECTED NOT DETECTED Final   Influenza B NOT DETECTED NOT DETECTED Final   Parainfluenza Virus 1 NOT DETECTED NOT DETECTED Final   Parainfluenza Virus 2 NOT DETECTED NOT DETECTED Final   Parainfluenza Virus 3 NOT DETECTED NOT DETECTED Final   Parainfluenza Virus 4 NOT  DETECTED NOT DETECTED Final   Respiratory Syncytial Virus NOT DETECTED NOT DETECTED Final   Bordetella pertussis NOT DETECTED NOT DETECTED Final   Bordetella Parapertussis NOT DETECTED NOT DETECTED Final   Chlamydophila pneumoniae NOT DETECTED NOT DETECTED Final   Mycoplasma pneumoniae NOT DETECTED NOT DETECTED Final    Comment: Performed at [facility], [place]. [address] [name], [place] [address]  MRSA Next Gen by PCR, Nasal     Status: None   Collection Time: 04/26/21 12:08 AM  Result Value Ref Range Status   MRSA by PCR Next Gen NOT DETECTED NOT DETECTED Final    Comment: (NOTE) The GeneXpert MRSA Assay (FDA approved for NASAL specimens only), is one component of a comprehensive MRSA colonization surveillance program. It is not intended to diagnose MRSA infection nor to guide or monitor treatment for MRSA infections. Test performance is not FDA approved in patients less than 64 years old. Performed at [facility], [place] [address] [place], [place] [address]   Culture, blood (x 2)     Status: None (Preliminary result)   Collection Time: 04/26/21  2:20 AM   Specimen: BLOOD  Result Value Ref Range Status   Specimen Description BLOOD BLOOD RIGHT WRIST  Final   Special Requests   Final    BOTTLES DRAWN AEROBIC AND ANAEROBIC Blood Culture adequate volume   Culture   Final    NO GROWTH 1 DAY Performed at [facility], [place] [address] [place], [place] [address]  Report Status PENDING  Incomplete  Culture, blood (x 2)     Status: None (Preliminary result)   Collection Time: 04/26/21  2:20 AM   Specimen: BLOOD RIGHT HAND  Result Value Ref Range Status   Specimen Description BLOOD RIGHT HAND  Final   Special Requests   Final    BOTTLES DRAWN AEROBIC AND ANAEROBIC Blood Culture adequate volume   Culture   Final    NO GROWTH 1 DAY Performed at [facility], [address], [place], [place] [address]    Report Status PENDING  Incomplete  Expectorated Sputum  Assessment w Gram Stain, Rflx to Resp Cult     Status: None   Collection Time: 04/26/21  2:35 AM   Specimen: Sputum  Result Value Ref Range Status   Specimen Description SPUTUM  Final   Special Requests NONE  Final   Sputum evaluation   Final    THIS SPECIMEN IS ACCEPTABLE FOR SPUTUM CULTURE Performed at [facility], [place] [address], [place], [place] [address]    Report Status 04/26/2021 FINAL  Final  Culture, Respiratory w Gram Stain     Status: None (Preliminary result)   Collection Time: 04/26/21  2:35 AM   Specimen: SPU  Result Value Ref Range Status   Specimen Description SPUTUM  Final   Special Requests NONE Reflexed from [id number]  Final   Gram Stain   Final    ABUNDANT WBC PRESENT, PREDOMINANTLY PMN MODERATE GRAM NEGATIVE COCCI FEW GRAM POSITIVE COCCI IN PAIRS    Culture   Final    CULTURE REINCUBATED FOR BETTER GROWTH Performed at [facility], [place] [address], [place], [place] [address]    Report Status PENDING  Incomplete  Urine Culture     Status: None   Collection Time: 04/26/21  2:40 AM   Specimen: Urine, Clean Catch  Result Value Ref Range Status   Specimen Description URINE, CLEAN CATCH  Final   Special Requests NONE  Final   Culture   Final    NO GROWTH Performed at [facility], [place] [address] [place], [place] [address]    Report Status 04/26/2021 FINAL  Final  C Difficile Quick Screen w PCR reflex     Status: Abnormal   Collection Time: 04/26/21  8:44 PM   Specimen: Stool  Result Value Ref Range Status   C Diff antigen POSITIVE (A) NEGATIVE Final   C Diff toxin NEGATIVE NEGATIVE Final   C Diff interpretation Results are indeterminate. See PCR results.  Final    Comment: Performed at [facility], [place] [address], [place], [place] [address]  C. Diff by PCR, Reflexed     Status: Abnormal   Collection Time: 04/26/21  8:44 PM  Result Value Ref Range Status   Toxigenic C. Difficile by PCR POSITIVE (A) NEGATIVE Final    Comment:  Positive for toxigenic C. difficile with little to no toxin production. Only treat if clinical presentation suggests symptomatic illness. Performed at [facility], [place] [address] [place], [place] [address]     RADIOLOGY STUDIES/RESULTS: CT ABDOMEN PELVIS W CONTRAST  Result Date: 04/26/2021 CLINICAL DATA:  55 year old female with history of left lower quadrant abdominal pain. EXAM: CT ABDOMEN AND PELVIS WITH CONTRAST TECHNIQUE: Multidetector CT imaging of the abdomen and pelvis was performed using the standard protocol following bolus administration of intravenous contrast. CONTRAST:  91mL OMNIPAQUE IOHEXOL 300 MG/ML  SOLN COMPARISON:  CT the abdomen  and pelvis 10/18/2020. FINDINGS: Lower chest: Patchy peribronchovascular ground-glass attenuation and nodular airspace disease noted in the periphery of the lung bases bilaterally. Hepatobiliary: Diffuse low attenuation throughout the hepatic parenchyma, suggestive of a background of hepatic steatosis. No suspicious cystic or solid hepatic lesions. No intra or extrahepatic biliary ductal dilatation. Status post cholecystectomy. Pancreas: No pancreatic mass. No pancreatic ductal dilatation. No pancreatic or peripancreatic fluid collections or inflammatory changes. Spleen: Unremarkable. Adrenals/Urinary Tract: Bilateral kidneys and adrenal glands are normal in appearance. No hydroureteronephrosis. Urinary bladder is normal in appearance. Stomach/Bowel: The appearance of the stomach is normal. No pathologic dilatation of small bowel or colon. Numerous colonic diverticulae are noted, particularly in the sigmoid colon, without surrounding inflammatory changes to suggest an acute diverticulitis at this time. Normal appendix. Vascular/Lymphatic: Aortic atherosclerosis. No aneurysm or dissection noted in the abdominal or pelvic vasculature. No lymphadenopathy noted in the abdomen or pelvis. Reproductive: Status post hysterectomy. Ovaries are not confidently  identified may be surgically absent or atrophic. Other: No significant volume of ascites.  No pneumoperitoneum. Musculoskeletal: There are no aggressive appearing lytic or blastic lesions noted in the visualized portions of the skeleton. IMPRESSION: 1. No acute findings are noted in the abdomen or pelvis to account for the patient's symptoms. 2. There is extensive colonic diverticulosis, particularly in the sigmoid colon, but no definitive imaging findings to clearly indicate an acute diverticulitis at this time. 3. Probable hepatic steatosis. 4. Aortic atherosclerosis. 5. Findings in the lung bases which may suggest multilobar bilateral bronchopneumonia. Correlation with chest x-ray is recommended. Electronically Signed   By: Vinnie Langton M.D.   On: 04/26/2021 06:53   DG Chest Portable 1 View  Result Date: 04/25/2021 CLINICAL DATA:  Short of breath, headache EXAM: PORTABLE CHEST 1 VIEW COMPARISON:  10/18/2020 FINDINGS: Single frontal view of the chest demonstrates an unremarkable cardiac silhouette. Chronic background interstitial scarring without acute airspace disease, effusion, or pneumothorax. No acute bony abnormalities. IMPRESSION: 1. No acute intrathoracic process. Electronically Signed   By: Randa Ngo M.D.   On: 04/25/2021 20:26     LOS: 2 days   Oren Binet, MD  [facility]    To contact the attending provider between 7A-7P or the covering provider during after hours 7P-7A, please log into the web site [email or web address] and access using universal [place] password for that web site. If you do not have the password, please call the hospital operator.  04/27/2021, 1:18 PM

## 2021-04-27 NOTE — Progress Notes (Signed)
Initial Nutrition Assessment  DOCUMENTATION CODES:   Not applicable  INTERVENTION:   Encourage good PO intake  Multivitamin w/ minerals daily Ensure Enlive po BID, each supplement provides 350 kcal and 20 grams of protein  NUTRITION DIAGNOSIS:   Increased nutrient needs related to chronic illness (COPD) as evidenced by estimated needs.  GOAL:   Patient will meet greater than or equal to 90% of their needs  MONITOR:   PO intake, Labs, Weight trends, Supplement acceptance  REASON FOR ASSESSMENT:   Malnutrition Screening Tool    ASSESSMENT:   55 y.o. female that presented to the ED with fever, cough, congestion, headaches, and diarrhea x 4 days. PMH includes COPD, HTN, and diverticulitis. Pt admitted with COPD exacerbation and AKI.    Pt reports that her appetite was ok and normal up until late last week; that is when is begin to decrease. Pt states that she is now really hungry. Pt endorses fatigue and shortness of breathe when eating, knows that she has to take smaller bites and chew well.  Pt reports that she didn't eat Saturday, Sunday ate 1/2 sausage mcmuffin, and Monday ate 1/2 egg w/ tomato slice.  Pt reports a typical intake of: Breakfast: cereal, sometimes nothing Lunch: usually skips Supper: spaghetti, chicken w/ seasoned vegs   Pt reports that she has had issues throwing up in the morning, but now regulated once on proper medications. States that she had often flare ups with diverticulitis also slowed down once medicines were regulated.  Pt reports a UBW of 155# and then gained to 169# about 6 months ago; reports now weighing 157# last week. Pt reports that her weight often fluctuates. Per EMR, pt has not had any significant weight loss within the past year. Pt reports that she was using a walker after knee surgery, but no longer needs it.  No ONS at home. Discussed ONS with pt; pt agreeable. Discussed continuing at home if she was not going to eat to ensure  proper nutrients.   Pt with no other questions or concerns at this time.  Medications reviewed and include: Zithromax, Solu-Medrol, Protonix, IV antibiotics Labs reviewed: Sodium 134  NUTRITION - FOCUSED PHYSICAL EXAM:  Flowsheet Row Most Recent Value  Orbital Region No depletion  Upper Arm Region No depletion  Thoracic and Lumbar Region No depletion  Buccal Region No depletion  Temple Region No depletion  Clavicle Bone Region No depletion  Clavicle and Acromion Bone Region No depletion  Scapular Bone Region No depletion  Dorsal Hand No depletion  Patellar Region Mild depletion  Anterior Thigh Region Mild depletion  Posterior Calf Region Mild depletion  Edema (RD Assessment) None  Hair Reviewed  Eyes Reviewed  Mouth Reviewed  Skin Reviewed  Nails Reviewed       Diet Order:   Diet Order             Diet Heart Room service appropriate? Yes; Fluid consistency: Thin  Diet effective now                   EDUCATION NEEDS:   No education needs have been identified at this time  Skin:  Skin Assessment: Reviewed RN Assessment  Last BM:  04/26/2021  Height:   Ht Readings from Last 1 Encounters:  04/25/21 5\' 3"  (1.6 m)    Weight:   Wt Readings from Last 1 Encounters:  04/27/21 76.3 kg    Ideal Body Weight:  52.3 kg  BMI:  Body mass index  is 29.8 kg/m.  Estimated Nutritional Needs:   Kcal:  2000-2200  Protein:  100-115 grams  Fluid:  >/= 2 L    Opha Mcghee Louie Casa, RD, LDN Clinical Dietitian See [facility] for contact information.

## 2021-04-27 NOTE — Progress Notes (Signed)
°  Transition of Care [facility]) Screening Note   Patient Details  Name: Vicki [name] Date of Birth: 1965-08-10   Transition of Care [facility]) CM/SW Contact:    Benard Halsted, LCSW Phone Number: 04/27/2021, 9:50 AM    Transition of Care Department [facility]) has reviewed patient and no TOC needs have been identified at this time. We will continue to monitor patient advancement through interdisciplinary progression rounds. If new patient transition needs arise, please place a TOC consult.

## 2021-04-28 ENCOUNTER — Inpatient Hospital Stay (HOSPITAL_COMMUNITY): Payer: 59

## 2021-04-28 LAB — BASIC METABOLIC PANEL
Anion gap: 8 (ref 5–15)
BUN: 24 mg/dL — ABNORMAL HIGH (ref 6–20)
CO2: 28 mmol/L (ref 22–32)
Calcium: 8.9 mg/dL (ref 8.9–10.3)
Chloride: 99 mmol/L (ref 98–111)
Creatinine, Ser: 1.12 mg/dL — ABNORMAL HIGH (ref 0.44–1.00)
GFR, Estimated: 59 mL/min — ABNORMAL LOW (ref >60)
Glucose, Bld: 152 mg/dL — ABNORMAL HIGH (ref 70–99)
Potassium: 4.9 mmol/L (ref 3.5–5.1)
Sodium: 135 mmol/L (ref 135–145)

## 2021-04-28 LAB — CULTURE, RESPIRATORY W GRAM STAIN

## 2021-04-28 MED ORDER — IPRATROPIUM-ALBUTEROL 0.5-2.5 (3) MG/3ML IN SOLN
3.0000 mL | Freq: Three times a day (TID) | RESPIRATORY_TRACT | Status: DC
  Administered 2021-04-28 – 2021-05-01 (×8): 3 mL via RESPIRATORY_TRACT
  Filled 2021-04-28 (×8): qty 3

## 2021-04-28 MED ORDER — METHYLPREDNISOLONE SODIUM SUCC 40 MG IJ SOLR
40.0000 mg | Freq: Two times a day (BID) | INTRAMUSCULAR | Status: DC
  Administered 2021-04-28 – 2021-04-29 (×2): 40 mg via INTRAVENOUS
  Filled 2021-04-28 (×2): qty 1

## 2021-04-28 NOTE — Progress Notes (Signed)
PROGRESS NOTE        PATIENT DETAILS Name: Vicki [name] Age: 55 y.o. Sex: female Date of Birth: 1965/12/23 Admit Date: 04/25/2021 Admitting Physician Toy Baker, MD TZG:YFVCBS, Richard, NP  Brief Narrative: Patient is a 55 y.o. female with history of COPD on home O2 who presented with cough, worsening shortness of breath for the past 3-4 days-she was found to have COPD exacerbation and community-acquired pneumonia and subsequently admitted to the hospitalist service.  See below for further details.  Subjective: Much better and diarrhea has improved having stools getting more formed-less frequent.  Still coughing but wheezing improved.  Objective: Vitals: Blood pressure 116/80, pulse 90, temperature 98.1 F (36.7 C), temperature source Oral, resp. rate 16, weight 76.3 kg, SpO2 92 %.   Exam: Gen Exam:Alert awake-not in any distress HEENT:atraumatic, normocephalic Chest: Moving air-but still with some rhonchi. CVS:S1S2 regular Abdomen:soft non tender, non distended Extremities:no edema Neurology: Non focal Skin: no rash   Pertinent Labs/Radiology: Recent Labs  Lab 04/25/21 1946 04/26/21 0006 04/26/21 0129 04/26/21 0337 04/27/21 0152 04/28/21 0117  WBC 10.7*  --  6.7  --  11.3*  --   HGB 13.3   < > 12.7   < > 11.5*  --   PLT 296  --  255  --  254  --   NA 136   < > 133*   < > 134* 135  K 3.5   < > 3.2*   < > 4.2 4.9  CREATININE 1.10*  --  1.02*  --  0.95 1.12*  AST 36  --  35  --  19  --   ALT 32  --  31  --  20  --   ALKPHOS 153*  --  158*  --  133*  --   BILITOT 0.9  --  0.5  --  0.5  --    < > = values in this interval not displayed.      Assessment/Plan: Acute on chronic hypoxic respiratory failure due to COPD exacerbation and community-acquired pneumonia: Slowly improving-still wheezing-coughing but moving air significantly more better today.  Remains on 4-5 L of oxygen.  Start tapering steroids-continue  Rocephin/Zithromax/bronchodilators.  Blood/sputum cultures remain negative so far-follow until final.  SIRS due to community-acquired pneumonia: SIRS physiology improving-on empiric IV antibiotics  Acute urinary retention: Due to acute illness-Foley removed-voiding spontaneously.  Diarrhea: Had diarrhea-stool studies equivocal-given prior history of C. difficile colitis-we will treat with oral vancomycin.  Diarrhea improving-Per patient-stools getting more formed-continue oral vancomycin.  Stop Rocephin after patient completes 5 days of treatment.  Hypokalemia: Repleted  HTN: BP stable-continue to hold all antihypertensives-resume when able.  AKI: Mild-likely hemodynamically mediated-creatinine has improved.  Restless leg syndrome: Continue Requip  Anxiety: Continue as needed Xanax/Celexa  BMI Estimated body mass index is 29.8 kg/m as calculated from the following:   Height as of an earlier encounter on 04/25/21: 5\' 3"  (1.6 m).   Weight as of this encounter: 76.3 kg.    Procedures: None Consults: PCCM. DVT Prophylaxis: Lovenox Code Status:Full code Family Communication: None at bedside  Time spent: 35 minutes-Greater than 50% of this time was spent in counseling, explanation of diagnosis, planning of further management, and coordination of care.   Disposition Plan: Status is: Inpatient  Remains inpatient appropriate because: Acute on chronic hypoxic respiratory failure due to COPD  exacerbation provoked by PNA-on 5-6 L of oxygen (more than baseline) on IV antibiotics-not yet stable for discharge.   Diet: Diet Order             Diet Heart Room service appropriate? Yes; Fluid consistency: Thin  Diet effective now                     Antimicrobial agents: Anti-infectives (From admission, onward)    Start     Dose/Rate Route Frequency Ordered Stop   04/27/21 1130  vancomycin (VANCOCIN) capsule 125 mg        125 mg Oral 4 times daily 04/27/21 1038 05/07/21 0959    04/27/21 0000  cefTRIAXone (ROCEPHIN) 2 g in sodium chloride 0.9 % 100 mL IVPB        2 g 200 mL/hr over 30 Minutes Intravenous Every 24 hours 04/26/21 0125 05/01/21 2359   04/26/21 1000  azithromycin (ZITHROMAX) tablet 500 mg        500 mg Oral Daily 04/26/21 0832 04/28/21 0802   04/26/21 0200  metroNIDAZOLE (FLAGYL) IVPB 500 mg  Status:  Discontinued        500 mg 100 mL/hr over 60 Minutes Intravenous Every 8 hours 04/26/21 0140 04/26/21 0834   04/26/21 0130  cefTRIAXone (ROCEPHIN) 2 g in sodium chloride 0.9 % 100 mL IVPB        2 g 200 mL/hr over 30 Minutes Intravenous  Once 04/26/21 0129 04/26/21 0237        MEDICATIONS: Scheduled Meds:  benzonatate  200 mg Oral TID   citalopram  60 mg Oral Daily   enoxaparin (LOVENOX) injection  40 mg Subcutaneous Q24H   feeding supplement  237 mL Oral BID BM   fluticasone  2 spray Each Nare Daily   gabapentin  300 mg Oral BID   guaiFENesin  1,200 mg Oral BID   ipratropium-albuterol  3 mL Nebulization TID   loratadine  10 mg Oral Daily   methylPREDNISolone (SOLU-MEDROL) injection  60 mg Intravenous Q12H   montelukast  10 mg Oral QHS   multivitamin with minerals  1 tablet Oral Daily   oxymetazoline  1 spray Each Nare BID   pantoprazole  40 mg Oral BID AC   rOPINIRole  1-2 mg Oral QHS   vancomycin  125 mg Oral QID   Continuous Infusions:  cefTRIAXone (ROCEPHIN)  IV 2 g (04/27/21 2359)   PRN Meds:.acetaminophen **OR** acetaminophen, albuterol, ALPRAZolam, chlorpheniramine-HYDROcodone, HYDROcodone-acetaminophen, menthol-cetylpyridinium, ondansetron **OR** ondansetron (ZOFRAN) IV   I have personally reviewed following labs and imaging studies  LABORATORY DATA: CBC: Recent Labs  Lab 04/25/21 1946 04/26/21 0006 04/26/21 0129 04/26/21 0337 04/27/21 0152  WBC 10.7*  --  6.7  --  11.3*  NEUTROABS  --   --  6.0  --   --   HGB 13.3 13.6 12.7 13.3 11.5*  HCT 40.8 40.0 38.9 39.0 36.0  MCV 97.6  --  98.2  --  97.3  PLT 296  --  255   --  254     Basic Metabolic Panel: Recent Labs  Lab 04/25/21 1946 04/26/21 0006 04/26/21 0129 04/26/21 0337 04/27/21 0152 04/28/21 0117  NA 136 134* 133* 134* 134* 135  K 3.5 2.8* 3.2* 3.2* 4.2 4.9  CL 93*  --  94*  --  100 99  CO2 30  --  27  --  26 28  GLUCOSE 96  --  211*  --  194* 152*  BUN 18  --  19  --  16 24*  CREATININE 1.10*  --  1.02*  --  0.95 1.12*  CALCIUM 8.6*  --  8.4*  --  8.4* 8.9  MG  --   --  2.6*  --  2.2  --   PHOS  --   --  4.5  --   --   --      GFR: Estimated Creatinine Clearance: 56.9 mL/min (A) (by C-G formula based on SCr of 1.12 mg/dL (H)).  Liver Function Tests: Recent Labs  Lab 04/25/21 1946 04/26/21 0129 04/27/21 0152  AST 36 35 19  ALT 32 31 20  ALKPHOS 153* 158* 133*  BILITOT 0.9 0.5 0.5  PROT 7.2 7.0 5.9*  ALBUMIN 3.3* 3.0* 2.5*    No results for input(s): LIPASE, AMYLASE in the last 168 hours. No results for input(s): AMMONIA in the last 168 hours.  Coagulation Profile: No results for input(s): INR, PROTIME in the last 168 hours.  Cardiac Enzymes: No results for input(s): CKTOTAL, CKMB, CKMBINDEX, TROPONINI in the last 168 hours.  BNP (last 3 results) No results for input(s): PROBNP in the last 8760 hours.  Lipid Profile: No results for input(s): CHOL, HDL, LDLCALC, TRIG, CHOLHDL, LDLDIRECT in the last 72 hours.  Thyroid Function Tests: Recent Labs    04/26/21 0220  TSH 0.698     Anemia Panel: No results for input(s): VITAMINB12, FOLATE, FERRITIN, TIBC, IRON, RETICCTPCT in the last 72 hours.  Urine analysis:    Component Value Date/Time   COLORURINE YELLOW 04/26/2021 0240   APPEARANCEUR CLEAR 04/26/2021 0240   APPEARANCEUR Cloudy (A) 04/23/2019 1141   LABSPEC >1.030 (H) 04/26/2021 0240   PHURINE 5.5 04/26/2021 0240   GLUCOSEU NEGATIVE 04/26/2021 0240   GLUCOSEU NEGATIVE 08/18/2020 1346   HGBUR NEGATIVE 04/26/2021 0240   BILIRUBINUR MODERATE (A) 04/26/2021 0240   BILIRUBINUR negative 07/14/2019 1342    BILIRUBINUR Negative 04/23/2019 1141   KETONESUR 15 (A) 04/26/2021 0240   PROTEINUR NEGATIVE 04/26/2021 0240   UROBILINOGEN 0.2 08/18/2020 1346   NITRITE NEGATIVE 04/26/2021 0240   LEUKOCYTESUR NEGATIVE 04/26/2021 0240    Sepsis Labs: Lactic Acid, Venous    Component Value Date/Time   LATICACIDVEN 2.5 (HH) 04/26/2021 0700    MICROBIOLOGY: Recent Results (from the past 240 hour(s))  Resp Panel by RT-PCR (Flu A&B, Covid) Nasopharyngeal Swab     Status: None   Collection Time: 04/25/21  7:10 PM   Specimen: Nasopharyngeal Swab; Nasopharyngeal(NP) swabs in vial transport medium  Result Value Ref Range Status   SARS Coronavirus 2 by RT PCR NEGATIVE NEGATIVE Final    Comment: (NOTE) SARS-CoV-2 target nucleic acids are NOT DETECTED.  The SARS-CoV-2 RNA is generally detectable in upper respiratory specimens during the acute phase of infection. The lowest concentration of SARS-CoV-2 viral copies this assay can detect is 138 copies/mL. A negative result does not preclude SARS-Cov-2 infection and should not be used as the sole basis for treatment or other patient management decisions. A negative result may occur with  improper specimen collection/handling, submission of specimen other than nasopharyngeal swab, presence of viral mutation(s) within the areas targeted by this assay, and inadequate number of viral copies(<138 copies/mL). A negative result must be combined with clinical observations, patient history, and epidemiological information. The expected result is Negative.  Fact Sheet for Patients:  EntrepreneurPulse.com.au  Fact Sheet for Healthcare Providers:  IncredibleEmployment.be  This test is no t yet approved or cleared by the [place] FDA and  has been  authorized for detection and/or diagnosis of SARS-CoV-2 by FDA under an Emergency Use Authorization (EUA). This EUA will remain  in effect (meaning this test can be used) for the  duration of the COVID-19 declaration under Section 564(b)(1) of the Act, 21 U.S.C.section 360bbb-3(b)(1), unless the authorization is terminated  or revoked sooner.       Influenza A by PCR NEGATIVE NEGATIVE Final   Influenza B by PCR NEGATIVE NEGATIVE Final    Comment: (NOTE) The Xpert Xpress SARS-CoV-2/FLU/RSV plus assay is intended as an aid in the diagnosis of influenza from Nasopharyngeal swab specimens and should not be used as a sole basis for treatment. Nasal washings and aspirates are unacceptable for Xpert Xpress SARS-CoV-2/FLU/RSV testing.  Fact Sheet for Patients: EntrepreneurPulse.com.au  Fact Sheet for Healthcare Providers: IncredibleEmployment.be  This test is not yet approved or cleared by the [place] FDA and has been authorized for detection and/or diagnosis of SARS-CoV-2 by FDA under an Emergency Use Authorization (EUA). This EUA will remain in effect (meaning this test can be used) for the duration of the COVID-19 declaration under Section 564(b)(1) of the Act, 21 U.S.C. section 360bbb-3(b)(1), unless the authorization is terminated or revoked.  Performed at [facility], [place] [address], [place], [place] [address]   Respiratory (~20 pathogens) panel by PCR     Status: None   Collection Time: 04/25/21 11:27 PM   Specimen: Nasopharyngeal Swab; Respiratory  Result Value Ref Range Status   Adenovirus NOT DETECTED NOT DETECTED Final   Coronavirus 229E NOT DETECTED NOT DETECTED Final    Comment: (NOTE) The Coronavirus on the Respiratory Panel, DOES NOT test for the novel  Coronavirus (2019 nCoV)    Coronavirus HKU1 NOT DETECTED NOT DETECTED Final   Coronavirus NL63 NOT DETECTED NOT DETECTED Final   Coronavirus OC43 NOT DETECTED NOT DETECTED Final   Metapneumovirus NOT DETECTED NOT DETECTED Final   Rhinovirus / Enterovirus NOT DETECTED NOT DETECTED Final   Influenza A NOT DETECTED NOT DETECTED Final    Influenza B NOT DETECTED NOT DETECTED Final   Parainfluenza Virus 1 NOT DETECTED NOT DETECTED Final   Parainfluenza Virus 2 NOT DETECTED NOT DETECTED Final   Parainfluenza Virus 3 NOT DETECTED NOT DETECTED Final   Parainfluenza Virus 4 NOT DETECTED NOT DETECTED Final   Respiratory Syncytial Virus NOT DETECTED NOT DETECTED Final   Bordetella pertussis NOT DETECTED NOT DETECTED Final   Bordetella Parapertussis NOT DETECTED NOT DETECTED Final   Chlamydophila pneumoniae NOT DETECTED NOT DETECTED Final   Mycoplasma pneumoniae NOT DETECTED NOT DETECTED Final    Comment: Performed at [facility], [place] [address] [place], [place] [address]  MRSA Next Gen by PCR, Nasal     Status: None   Collection Time: 04/26/21 12:08 AM  Result Value Ref Range Status   MRSA by PCR Next Gen NOT DETECTED NOT DETECTED Final    Comment: (NOTE) The GeneXpert MRSA Assay (FDA approved for NASAL specimens only), is one component of a comprehensive MRSA colonization surveillance program. It is not intended to diagnose MRSA infection nor to guide or monitor treatment for MRSA infections. Test performance is not FDA approved in patients less than 41 years old. Performed at [facility], [place] [address] [place], [place] [address]   Culture, blood (x 2)     Status: None (Preliminary result)   Collection Time: 04/26/21  2:20 AM   Specimen: BLOOD  Result Value Ref Range Status   Specimen Description BLOOD  BLOOD RIGHT WRIST  Final   Special Requests   Final    BOTTLES DRAWN AEROBIC AND ANAEROBIC Blood Culture adequate volume   Culture   Final    NO GROWTH 2 DAYS Performed at [facility], [address], [place], [place] [address]    Report Status PENDING  Incomplete  Culture, blood (x 2)     Status: None (Preliminary result)   Collection Time: 04/26/21  2:20 AM   Specimen: BLOOD RIGHT HAND  Result Value Ref Range Status   Specimen Description BLOOD RIGHT HAND  Final   Special Requests   Final     BOTTLES DRAWN AEROBIC AND ANAEROBIC Blood Culture adequate volume   Culture   Final    NO GROWTH 2 DAYS Performed at [facility], [place] [address], [place], [place] [address]    Report Status PENDING  Incomplete  Expectorated Sputum Assessment w Gram Stain, Rflx to Resp Cult     Status: None   Collection Time: 04/26/21  2:35 AM   Specimen: Sputum  Result Value Ref Range Status   Specimen Description SPUTUM  Final   Special Requests NONE  Final   Sputum evaluation   Final    THIS SPECIMEN IS ACCEPTABLE FOR SPUTUM CULTURE Performed at [facility], [place] [address], [place], [place] [address]    Report Status 04/26/2021 FINAL  Final  Culture, Respiratory w Gram Stain     Status: None   Collection Time: 04/26/21  2:35 AM   Specimen: SPU  Result Value Ref Range Status   Specimen Description SPUTUM  Final   Special Requests NONE Reflexed from [id number]  Final   Gram Stain   Final    ABUNDANT WBC PRESENT, PREDOMINANTLY PMN MODERATE GRAM NEGATIVE COCCI FEW GRAM POSITIVE COCCI IN PAIRS    Culture   Final    NO GROUP A STREP (S.PYOGENES) ISOLATED Normal respiratory flora-no Staph aureus or Pseudomonas seen Performed at [facility], [address], [place], [place] [address]    Report Status 04/28/2021 FINAL  Final  Urine Culture     Status: None   Collection Time: 04/26/21  2:40 AM   Specimen: Urine, Clean Catch  Result Value Ref Range Status   Specimen Description URINE, CLEAN CATCH  Final   Special Requests NONE  Final   Culture   Final    NO GROWTH Performed at [facility], [place] [address] [place], [place] [address]    Report Status 04/26/2021 FINAL  Final  Gastrointestinal Panel by PCR , Stool     Status: None   Collection Time: 04/26/21  8:44 PM   Specimen: Stool  Result Value Ref Range Status   Campylobacter species NOT DETECTED NOT DETECTED Final   Plesimonas shigelloides NOT DETECTED NOT DETECTED Final   Salmonella species NOT DETECTED NOT  DETECTED Final   Yersinia enterocolitica NOT DETECTED NOT DETECTED Final   Vibrio species NOT DETECTED NOT DETECTED Final   Vibrio cholerae NOT DETECTED NOT DETECTED Final   Enteroaggregative E coli (EAEC) NOT DETECTED NOT DETECTED Final   Enteropathogenic E coli (EPEC) NOT DETECTED NOT DETECTED Final   Enterotoxigenic E coli (ETEC) NOT DETECTED NOT DETECTED Final   Shiga like toxin producing E coli (STEC) NOT DETECTED NOT DETECTED Final   Shigella/Enteroinvasive E coli (EIEC) NOT DETECTED NOT DETECTED Final   Cryptosporidium NOT DETECTED NOT DETECTED Final   Cyclospora cayetanensis NOT DETECTED NOT DETECTED Final  Entamoeba histolytica NOT DETECTED NOT DETECTED Final   Giardia lamblia NOT DETECTED NOT DETECTED Final   Adenovirus F40/41 NOT DETECTED NOT DETECTED Final   Astrovirus NOT DETECTED NOT DETECTED Final   Norovirus GI/GII NOT DETECTED NOT DETECTED Final   Rotavirus A NOT DETECTED NOT DETECTED Final   Sapovirus (I, II, IV, and V) NOT DETECTED NOT DETECTED Final    Comment: Performed at [facility], [address] [place], [place] [address]  C Difficile Quick Screen w PCR reflex     Status: Abnormal   Collection Time: 04/26/21  8:44 PM   Specimen: Stool  Result Value Ref Range Status   C Diff antigen POSITIVE (A) NEGATIVE Final   C Diff toxin NEGATIVE NEGATIVE Final   C Diff interpretation Results are indeterminate. See PCR results.  Final    Comment: Performed at [facility], [place] [address], [place],  [address]  C. Diff by PCR, Reflexed     Status: Abnormal   Collection Time: 04/26/21  8:44 PM  Result Value Ref Range Status   Toxigenic C. Difficile by PCR POSITIVE (A) NEGATIVE Final    Comment: Positive for toxigenic C. difficile with little to no toxin production. Only treat if clinical presentation suggests symptomatic illness. Performed at [facility], [place] [address] [place],  [address]     RADIOLOGY STUDIES/RESULTS: DG Chest  Port 1 View  Result Date: 04/28/2021 CLINICAL DATA:  Shortness of breath. EXAM: PORTABLE CHEST 1 VIEW COMPARISON:  Chest x-ray dated April 25, 2021. FINDINGS: The heart size and mediastinal contours are within normal limits. Both lungs are clear. The visualized skeletal structures are unremarkable. IMPRESSION: No active disease. Electronically Signed   By: Titus Dubin M.D.   On: 04/28/2021 09:02     LOS: 3 days   Oren Binet, MD  [facility]    To contact the attending provider between 7A-7P or the covering provider during after hours 7P-7A, please log into the web site [email or web address] and access using universal [place] password for that web site. If you do not have the password, please call the hospital operator.  04/28/2021, 1:58 PM

## 2021-04-29 MED ORDER — AMLODIPINE BESYLATE 5 MG PO TABS
5.0000 mg | ORAL_TABLET | Freq: Every day | ORAL | Status: DC
  Administered 2021-04-29 – 2021-04-30 (×2): 5 mg via ORAL
  Filled 2021-04-29 (×2): qty 1

## 2021-04-29 MED ORDER — PREDNISONE 20 MG PO TABS
40.0000 mg | ORAL_TABLET | Freq: Every day | ORAL | Status: DC
  Administered 2021-04-30 – 2021-05-01 (×2): 40 mg via ORAL
  Filled 2021-04-29 (×2): qty 2

## 2021-04-29 NOTE — Progress Notes (Signed)
PROGRESS NOTE        PATIENT DETAILS Name: Vicki [name] Age: 55 y.o. Sex: female Date of Birth: 1965-05-30 Admit Date: 04/25/2021 Admitting Physician Toy Baker, MD JJH:ERDEYC, Richard, NP  Brief Narrative: Patient is a 55 y.o. female with history of COPD on home O2 who presented with cough, worsening shortness of breath for the past 3-4 days-she was found to have COPD exacerbation and community-acquired pneumonia and subsequently admitted to the hospitalist service.  See below for further details.  Subjective: Slowly improving-still having coughing spells-finally down to 3 L of oxygen (was on 4-5 L yesterday)  Objective: Vitals: Blood pressure (!) 151/94, pulse 89, temperature 97.9 F (36.6 C), temperature source Oral, resp. rate 20, weight 76.3 kg, SpO2 96 %.   Exam: Gen Exam:Alert awake-not in any distress HEENT:atraumatic, normocephalic Chest: Moving air well-still with scattered rhonchi all over but clearly better than the past few days. CVS:S1S2 regular Abdomen:soft non tender, non distended Extremities:no edema Neurology: Non focal Skin: no rash   Pertinent Labs/Radiology: Recent Labs  Lab 04/25/21 1946 04/26/21 0006 04/26/21 0129 04/26/21 0337 04/27/21 0152 04/28/21 0117  WBC 10.7*  --  6.7  --  11.3*  --   HGB 13.3   < > 12.7   < > 11.5*  --   PLT 296  --  255  --  254  --   NA 136   < > 133*   < > 134* 135  K 3.5   < > 3.2*   < > 4.2 4.9  CREATININE 1.10*  --  1.02*  --  0.95 1.12*  AST 36  --  35  --  19  --   ALT 32  --  31  --  20  --   ALKPHOS 153*  --  158*  --  133*  --   BILITOT 0.9  --  0.5  --  0.5  --    < > = values in this interval not displayed.     Assessment/Plan: Acute on chronic hypoxic respiratory failure due to COPD exacerbation and community-acquired pneumonia (infiltrates seen on lung cuts on CT abdomen): Improving-continue to have coughing spells-less wheezing today-moving air  better-continue tapering steroids/antibiotics/bronchodilators.  Blood/sputum cultures remain negative so far.  Continue use of incentive spirometry/flutter valve-I have asked nursing staff to see if he can ambulate patient and get her out of bed to chair this morning.  SIRS due to community-acquired pneumonia: SIRS physiology improving-on empiric IV antibiotics.  Cultures are negative so far.  Acute urinary retention: Due to acute illness-Foley now removed-voiding spontaneously.  Diarrhea: Had diarrhea-stool studies equivocal-given prior history of C. difficile colitis-being treated with oral vancomycin.  Diarrhea almost essentially resolved-we will empirically plan on at least 10 days of treatment with oral vancomycin.  Plan is to stop Rocephin after 5 days of treatment.  No longer on Zithromax.  Will need to continue to minimize antimicrobial therapy use in the future.   Hypokalemia: Repleted  HTN: BP now creeping up-resume amlodipine and follow.   AKI: Mild-likely hemodynamically mediated-creatinine has improved.  Restless leg syndrome: Continue Requip  Anxiety: Continue as needed Xanax/Celexa  BMI Estimated body mass index is 29.8 kg/m as calculated from the following:   Height as of an earlier encounter on 04/25/21: 5\' 3"  (1.6 m).   Weight as of this encounter:  76.3 kg.    Procedures: None Consults: PCCM. DVT Prophylaxis: Lovenox Code Status:Full code Family Communication: None at bedside  Time spent: 25 minutes-Greater than 50% of this time was spent in counseling, explanation of diagnosis, planning of further management, and coordination of care.   Disposition Plan: Status is: Inpatient  Remains inpatient appropriate because: Acute on chronic hypoxic respiratory failure due to COPD exacerbation provoked by PNA-improving hypoxemia-although improved-not yet at baseline-not yet stable for discharge.   Diet: Diet Order             Diet Heart Room service appropriate?  Yes; Fluid consistency: Thin  Diet effective now                     Antimicrobial agents: Anti-infectives (From admission, onward)    Start     Dose/Rate Route Frequency Ordered Stop   04/27/21 1130  vancomycin (VANCOCIN) capsule 125 mg        125 mg Oral 4 times daily 04/27/21 1038 05/07/21 0959   04/27/21 0000  cefTRIAXone (ROCEPHIN) 2 g in sodium chloride 0.9 % 100 mL IVPB        2 g 200 mL/hr over 30 Minutes Intravenous Every 24 hours 04/26/21 0125 05/01/21 2359   04/26/21 1000  azithromycin (ZITHROMAX) tablet 500 mg        500 mg Oral Daily 04/26/21 0832 04/28/21 0802   04/26/21 0200  metroNIDAZOLE (FLAGYL) IVPB 500 mg  Status:  Discontinued        500 mg 100 mL/hr over 60 Minutes Intravenous Every 8 hours 04/26/21 0140 04/26/21 0834   04/26/21 0130  cefTRIAXone (ROCEPHIN) 2 g in sodium chloride 0.9 % 100 mL IVPB        2 g 200 mL/hr over 30 Minutes Intravenous  Once 04/26/21 0129 04/26/21 0237        MEDICATIONS: Scheduled Meds:  benzonatate  200 mg Oral TID   citalopram  60 mg Oral Daily   enoxaparin (LOVENOX) injection  40 mg Subcutaneous Q24H   feeding supplement  237 mL Oral BID BM   fluticasone  2 spray Each Nare Daily   gabapentin  300 mg Oral BID   guaiFENesin  1,200 mg Oral BID   ipratropium-albuterol  3 mL Nebulization TID   loratadine  10 mg Oral Daily   methylPREDNISolone (SOLU-MEDROL) injection  40 mg Intravenous Q12H   montelukast  10 mg Oral QHS   multivitamin with minerals  1 tablet Oral Daily   pantoprazole  40 mg Oral BID AC   rOPINIRole  1-2 mg Oral QHS   vancomycin  125 mg Oral QID   Continuous Infusions:  cefTRIAXone (ROCEPHIN)  IV Stopped (04/29/21 0106)   PRN Meds:.acetaminophen **OR** acetaminophen, albuterol, ALPRAZolam, chlorpheniramine-HYDROcodone, HYDROcodone-acetaminophen, menthol-cetylpyridinium, ondansetron **OR** ondansetron (ZOFRAN) IV   I have personally reviewed following labs and imaging studies  LABORATORY  DATA: CBC: Recent Labs  Lab 04/25/21 1946 04/26/21 0006 04/26/21 0129 04/26/21 0337 04/27/21 0152  WBC 10.7*  --  6.7  --  11.3*  NEUTROABS  --   --  6.0  --   --   HGB 13.3 13.6 12.7 13.3 11.5*  HCT 40.8 40.0 38.9 39.0 36.0  MCV 97.6  --  98.2  --  97.3  PLT 296  --  255  --  254     Basic Metabolic Panel: Recent Labs  Lab 04/25/21 1946 04/26/21 0006 04/26/21 0129 04/26/21 0337 04/27/21 0152 04/28/21 0117  NA 136 134*  133* 134* 134* 135  K 3.5 2.8* 3.2* 3.2* 4.2 4.9  CL 93*  --  94*  --  100 99  CO2 30  --  27  --  26 28  GLUCOSE 96  --  211*  --  194* 152*  BUN 18  --  19  --  16 24*  CREATININE 1.10*  --  1.02*  --  0.95 1.12*  CALCIUM 8.6*  --  8.4*  --  8.4* 8.9  MG  --   --  2.6*  --  2.2  --   PHOS  --   --  4.5  --   --   --      GFR: Estimated Creatinine Clearance: 56.9 mL/min (A) (by C-G formula based on SCr of 1.12 mg/dL (H)).  Liver Function Tests: Recent Labs  Lab 04/25/21 1946 04/26/21 0129 04/27/21 0152  AST 36 35 19  ALT 32 31 20  ALKPHOS 153* 158* 133*  BILITOT 0.9 0.5 0.5  PROT 7.2 7.0 5.9*  ALBUMIN 3.3* 3.0* 2.5*    No results for input(s): LIPASE, AMYLASE in the last 168 hours. No results for input(s): AMMONIA in the last 168 hours.  Coagulation Profile: No results for input(s): INR, PROTIME in the last 168 hours.  Cardiac Enzymes: No results for input(s): CKTOTAL, CKMB, CKMBINDEX, TROPONINI in the last 168 hours.  BNP (last 3 results) No results for input(s): PROBNP in the last 8760 hours.  Lipid Profile: No results for input(s): CHOL, HDL, LDLCALC, TRIG, CHOLHDL, LDLDIRECT in the last 72 hours.  Thyroid Function Tests: No results for input(s): TSH, T4TOTAL, FREET4, T3FREE, THYROIDAB in the last 72 hours.   Anemia Panel: No results for input(s): VITAMINB12, FOLATE, FERRITIN, TIBC, IRON, RETICCTPCT in the last 72 hours.  Urine analysis:    Component Value Date/Time   COLORURINE YELLOW 04/26/2021 0240   APPEARANCEUR  CLEAR 04/26/2021 0240   APPEARANCEUR Cloudy (A) 04/23/2019 1141   LABSPEC >1.030 (H) 04/26/2021 0240   PHURINE 5.5 04/26/2021 0240   GLUCOSEU NEGATIVE 04/26/2021 0240   GLUCOSEU NEGATIVE 08/18/2020 1346   HGBUR NEGATIVE 04/26/2021 0240   BILIRUBINUR MODERATE (A) 04/26/2021 0240   BILIRUBINUR negative 07/14/2019 1342   BILIRUBINUR Negative 04/23/2019 1141   KETONESUR 15 (A) 04/26/2021 0240   PROTEINUR NEGATIVE 04/26/2021 0240   UROBILINOGEN 0.2 08/18/2020 1346   NITRITE NEGATIVE 04/26/2021 0240   LEUKOCYTESUR NEGATIVE 04/26/2021 0240    Sepsis Labs: Lactic Acid, Venous    Component Value Date/Time   LATICACIDVEN 2.5 (HH) 04/26/2021 0700    MICROBIOLOGY: Recent Results (from the past 240 hour(s))  Resp Panel by RT-PCR (Flu A&B, Covid) Nasopharyngeal Swab     Status: None   Collection Time: 04/25/21  7:10 PM   Specimen: Nasopharyngeal Swab; Nasopharyngeal(NP) swabs in vial transport medium  Result Value Ref Range Status   SARS Coronavirus 2 by RT PCR NEGATIVE NEGATIVE Final    Comment: (NOTE) SARS-CoV-2 target nucleic acids are NOT DETECTED.  The SARS-CoV-2 RNA is generally detectable in upper respiratory specimens during the acute phase of infection. The lowest concentration of SARS-CoV-2 viral copies this assay can detect is 138 copies/mL. A negative result does not preclude SARS-Cov-2 infection and should not be used as the sole basis for treatment or other patient management decisions. A negative result may occur with  improper specimen collection/handling, submission of specimen other than nasopharyngeal swab, presence of viral mutation(s) within the areas targeted by this assay, and inadequate number of  viral copies(<138 copies/mL). A negative result must be combined with clinical observations, patient history, and epidemiological information. The expected result is Negative.  Fact Sheet for Patients:  EntrepreneurPulse.com.au  Fact Sheet for  Healthcare Providers:  IncredibleEmployment.be  This test is no t yet approved or cleared by the [place] FDA and  has been authorized for detection and/or diagnosis of SARS-CoV-2 by FDA under an Emergency Use Authorization (EUA). This EUA will remain  in effect (meaning this test can be used) for the duration of the COVID-19 declaration under Section 564(b)(1) of the Act, 21 U.S.C.section 360bbb-3(b)(1), unless the authorization is terminated  or revoked sooner.       Influenza A by PCR NEGATIVE NEGATIVE Final   Influenza B by PCR NEGATIVE NEGATIVE Final    Comment: (NOTE) The Xpert Xpress SARS-CoV-2/FLU/RSV plus assay is intended as an aid in the diagnosis of influenza from Nasopharyngeal swab specimens and should not be used as a sole basis for treatment. Nasal washings and aspirates are unacceptable for Xpert Xpress SARS-CoV-2/FLU/RSV testing.  Fact Sheet for Patients: EntrepreneurPulse.com.au  Fact Sheet for Healthcare Providers: IncredibleEmployment.be  This test is not yet approved or cleared by the [place] FDA and has been authorized for detection and/or diagnosis of SARS-CoV-2 by FDA under an Emergency Use Authorization (EUA). This EUA will remain in effect (meaning this test can be used) for the duration of the COVID-19 declaration under Section 564(b)(1) of the Act, 21 U.S.C. section 360bbb-3(b)(1), unless the authorization is terminated or revoked.  Performed at [facility], [place] [address], [place], [place] [address]   Respiratory (~20 pathogens) panel by PCR     Status: None   Collection Time: 04/25/21 11:27 PM   Specimen: Nasopharyngeal Swab; Respiratory  Result Value Ref Range Status   Adenovirus NOT DETECTED NOT DETECTED Final   Coronavirus 229E NOT DETECTED NOT DETECTED Final    Comment: (NOTE) The Coronavirus on the Respiratory Panel, DOES NOT test for the novel  Coronavirus (2019  nCoV)    Coronavirus HKU1 NOT DETECTED NOT DETECTED Final   Coronavirus NL63 NOT DETECTED NOT DETECTED Final   Coronavirus OC43 NOT DETECTED NOT DETECTED Final   Metapneumovirus NOT DETECTED NOT DETECTED Final   Rhinovirus / Enterovirus NOT DETECTED NOT DETECTED Final   Influenza A NOT DETECTED NOT DETECTED Final   Influenza B NOT DETECTED NOT DETECTED Final   Parainfluenza Virus 1 NOT DETECTED NOT DETECTED Final   Parainfluenza Virus 2 NOT DETECTED NOT DETECTED Final   Parainfluenza Virus 3 NOT DETECTED NOT DETECTED Final   Parainfluenza Virus 4 NOT DETECTED NOT DETECTED Final   Respiratory Syncytial Virus NOT DETECTED NOT DETECTED Final   Bordetella pertussis NOT DETECTED NOT DETECTED Final   Bordetella Parapertussis NOT DETECTED NOT DETECTED Final   Chlamydophila pneumoniae NOT DETECTED NOT DETECTED Final   Mycoplasma pneumoniae NOT DETECTED NOT DETECTED Final    Comment: Performed at [facility], [place]. [address], [place], [place] [address]  MRSA Next Gen by PCR, Nasal     Status: None   Collection Time: 04/26/21 12:08 AM  Result Value Ref Range Status   MRSA by PCR Next Gen NOT DETECTED NOT DETECTED Final    Comment: (NOTE) The GeneXpert MRSA Assay (FDA approved for NASAL specimens only), is one component of a comprehensive MRSA colonization surveillance program. It is not intended to diagnose MRSA infection nor to guide or monitor treatment for MRSA infections. Test performance is not FDA approved in  patients less than 43 years old. Performed at [facility], [place] [address], [place], [place] [address]   Culture, blood (x 2)     Status: None (Preliminary result)   Collection Time: 04/26/21  2:20 AM   Specimen: BLOOD  Result Value Ref Range Status   Specimen Description BLOOD BLOOD RIGHT WRIST  Final   Special Requests   Final    BOTTLES DRAWN AEROBIC AND ANAEROBIC Blood Culture adequate volume   Culture   Final    NO GROWTH 2 DAYS Performed at [facility], [place] [address], [place], [place] [address]    Report Status PENDING  Incomplete  Culture, blood (x 2)     Status: None (Preliminary result)   Collection Time: 04/26/21  2:20 AM   Specimen: BLOOD RIGHT HAND  Result Value Ref Range Status   Specimen Description BLOOD RIGHT HAND  Final   Special Requests   Final    BOTTLES DRAWN AEROBIC AND ANAEROBIC Blood Culture adequate volume   Culture   Final    NO GROWTH 2 DAYS Performed at [facility], [place] [address], [place], [place] [address]    Report Status PENDING  Incomplete  Expectorated Sputum Assessment w Gram Stain, Rflx to Resp Cult     Status: None   Collection Time: 04/26/21  2:35 AM   Specimen: Sputum  Result Value Ref Range Status   Specimen Description SPUTUM  Final   Special Requests NONE  Final   Sputum evaluation   Final    THIS SPECIMEN IS ACCEPTABLE FOR SPUTUM CULTURE Performed at [facility], [place] [address], [place], [place] [address]    Report Status 04/26/2021 FINAL  Final  Culture, Respiratory w Gram Stain     Status: None   Collection Time: 04/26/21  2:35 AM   Specimen: SPU  Result Value Ref Range Status   Specimen Description SPUTUM  Final   Special Requests NONE Reflexed from [id number]  Final   Gram Stain   Final    ABUNDANT WBC PRESENT, PREDOMINANTLY PMN MODERATE GRAM NEGATIVE COCCI FEW GRAM POSITIVE COCCI IN PAIRS    Culture   Final    NO GROUP A STREP (S.PYOGENES) ISOLATED Normal respiratory flora-no Staph aureus or Pseudomonas seen Performed at [facility], [address], [place], [place] [address]    Report Status 04/28/2021 FINAL  Final  Urine Culture     Status: None   Collection Time: 04/26/21  2:40 AM   Specimen: Urine, Clean Catch  Result Value Ref Range Status   Specimen Description URINE, CLEAN CATCH  Final   Special Requests NONE  Final   Culture   Final    NO GROWTH Performed at [facility], [place] [address], [place], [place] [address]    Report Status  04/26/2021 FINAL  Final  Gastrointestinal Panel by PCR , Stool     Status: None   Collection Time: 04/26/21  8:44 PM   Specimen: Stool  Result Value Ref Range Status   Campylobacter species NOT DETECTED NOT DETECTED Final   Plesimonas shigelloides NOT DETECTED NOT DETECTED Final   Salmonella species NOT DETECTED NOT DETECTED Final   Yersinia enterocolitica NOT DETECTED NOT DETECTED Final   Vibrio species NOT DETECTED NOT DETECTED Final   Vibrio cholerae NOT DETECTED NOT DETECTED Final   Enteroaggregative E coli (EAEC) NOT DETECTED NOT DETECTED Final   Enteropathogenic E coli (EPEC) NOT DETECTED NOT  DETECTED Final   Enterotoxigenic E coli (ETEC) NOT DETECTED NOT DETECTED Final   Shiga like toxin producing E coli (STEC) NOT DETECTED NOT DETECTED Final   Shigella/Enteroinvasive E coli (EIEC) NOT DETECTED NOT DETECTED Final   Cryptosporidium NOT DETECTED NOT DETECTED Final   Cyclospora cayetanensis NOT DETECTED NOT DETECTED Final   Entamoeba histolytica NOT DETECTED NOT DETECTED Final   Giardia lamblia NOT DETECTED NOT DETECTED Final   Adenovirus F40/41 NOT DETECTED NOT DETECTED Final   Astrovirus NOT DETECTED NOT DETECTED Final   Norovirus GI/GII NOT DETECTED NOT DETECTED Final   Rotavirus A NOT DETECTED NOT DETECTED Final   Sapovirus (I, II, IV, and V) NOT DETECTED NOT DETECTED Final    Comment: Performed at [facility], [place] [address] [place], [place] [address]  C Difficile Quick Screen w PCR reflex     Status: Abnormal   Collection Time: 04/26/21  8:44 PM   Specimen: Stool  Result Value Ref Range Status   C Diff antigen POSITIVE (A) NEGATIVE Final   C Diff toxin NEGATIVE NEGATIVE Final   C Diff interpretation Results are indeterminate. See PCR results.  Final    Comment: Performed at [facility], [place] [address] [place], [place] [address]  C. Diff by PCR, Reflexed     Status: Abnormal   Collection Time: 04/26/21  8:44 PM  Result Value Ref Range Status    Toxigenic C. Difficile by PCR POSITIVE (A) NEGATIVE Final    Comment: Positive for toxigenic C. difficile with little to no toxin production. Only treat if clinical presentation suggests symptomatic illness. Performed at [facility], [place] [address] [place], [place] [address]     RADIOLOGY STUDIES/RESULTS: DG Chest Port 1 View  Result Date: 04/28/2021 CLINICAL DATA:  Shortness of breath. EXAM: PORTABLE CHEST 1 VIEW COMPARISON:  Chest x-ray dated April 25, 2021. FINDINGS: The heart size and mediastinal contours are within normal limits. Both lungs are clear. The visualized skeletal structures are unremarkable. IMPRESSION: No active disease. Electronically Signed   By: Titus Dubin M.D.   On: 04/28/2021 09:02     LOS: 4 days   Oren Binet, MD  [facility]    To contact the attending provider between 7A-7P or the covering provider during after hours 7P-7A, please log into the web site [email or web address] and access using universal [place] password for that web site. If you do not have the password, please call the hospital operator.  04/29/2021, 10:23 AM

## 2021-04-30 LAB — BASIC METABOLIC PANEL
Anion gap: 7 (ref 5–15)
BUN: 23 mg/dL — ABNORMAL HIGH (ref 6–20)
CO2: 31 mmol/L (ref 22–32)
Calcium: 8.8 mg/dL — ABNORMAL LOW (ref 8.9–10.3)
Chloride: 98 mmol/L (ref 98–111)
Creatinine, Ser: 0.89 mg/dL (ref 0.44–1.00)
GFR, Estimated: >60 mL/min (ref >60)
Glucose, Bld: 104 mg/dL — ABNORMAL HIGH (ref 70–99)
Potassium: 4 mmol/L (ref 3.5–5.1)
Sodium: 136 mmol/L (ref 135–145)

## 2021-04-30 LAB — CBC
HCT: 40.9 % (ref 36.0–46.0)
Hemoglobin: 13.6 g/dL (ref 12.0–15.0)
MCH: 31.7 pg (ref 26.0–34.0)
MCHC: 33.3 g/dL (ref 30.0–36.0)
MCV: 95.3 fL (ref 80.0–100.0)
Platelets: 410 10*3/uL — ABNORMAL HIGH (ref 150–400)
RBC: 4.29 MIL/uL (ref 3.87–5.11)
RDW: 14.9 % (ref 11.5–15.5)
WBC: 14.9 10*3/uL — ABNORMAL HIGH (ref 4.0–10.5)
nRBC: 0 % (ref 0.0–0.2)

## 2021-04-30 LAB — PROCALCITONIN: Procalcitonin: <0.1 ng/mL

## 2021-04-30 MED ORDER — HYDROCODONE-ACETAMINOPHEN 5-325 MG PO TABS
1.0000 | ORAL_TABLET | Freq: Four times a day (QID) | ORAL | Status: DC | PRN
  Administered 2021-04-30 (×2): 1 via ORAL
  Filled 2021-04-30 (×2): qty 1

## 2021-04-30 NOTE — Progress Notes (Signed)
SATURATION QUALIFICATIONS: (This note is used to comply with regulatory documentation for home oxygen)  Patient Saturations on Room Air at Rest = 90%  Patient Saturations on Room Air while Ambulating = 80%  Patient Saturations on 3 Liters of oxygen while Ambulating = 92%  Please briefly explain why patient needs home oxygen:

## 2021-04-30 NOTE — Progress Notes (Signed)
PROGRESS NOTE        PATIENT DETAILS Name: Vicki [name] Age: 55 y.o. Sex: female Date of Birth: 04/15/1966 Admit Date: 04/25/2021 Admitting Physician Toy Baker, MD MHD:[id number], Richard, NP  Brief Narrative: Patient is a 55 y.o. female with history of COPD on home O2 who presented with cough, worsening shortness of breath for the past 3-4 days-she was found to have COPD exacerbation and community-acquired pneumonia and subsequently admitted to the hospitalist service.  See below for further details.  Subjective:  Patient in bed, appears comfortable, denies any headache, no fever, no chest pain or pressure, improved shortness of breath , no abdominal pain. No new focal weakness.   Objective: Vitals: Blood pressure 112/82, pulse 78, temperature 97.8 F (36.6 C), temperature source Oral, resp. rate (!) 21, weight 76.3 kg, SpO2 93 %.   Exam:  Awake Alert, No new F.N deficits, Normal affect [place].AT,PERRAL Supple Neck, No JVD,   Symmetrical Chest wall movement, Good air movement bilaterally, CTAB RRR,No Gallops, Rubs or new Murmurs,  +ve B.Sounds, Abd Soft, No tenderness,   No Cyanosis, Clubbing or edema    Pertinent Labs/Radiology: Recent Labs  Lab 04/25/21 1946 04/26/21 0006 04/26/21 0129 04/26/21 0337 04/27/21 0152 04/28/21 0117 04/30/21 0028  WBC 10.7*  --  6.7  --  11.3*  --  14.9*  HGB 13.3   < > 12.7   < > 11.5*  --  13.6  PLT 296  --  255  --  254  --  410*  NA 136   < > 133*   < > 134*   < > 136  K 3.5   < > 3.2*   < > 4.2   < > 4.0  CREATININE 1.10*  --  1.02*  --  0.95   < > 0.89  AST 36  --  35  --  19  --   --   ALT 32  --  31  --  20  --   --   ALKPHOS 153*  --  158*  --  133*  --   --   BILITOT 0.9  --  0.5  --  0.5  --   --    < > = values in this interval not displayed.    Assessment/Plan:  Acute on chronic hypoxic respiratory failure due to COPD exacerbation and community-acquired pneumonia (infiltrates  seen on lung cuts on CT abdomen): Improving-continue to have coughing spells-less wheezing today-moving air better-continue tapering steroids/antibiotics/bronchodilators.  Blood/sputum cultures remain negative so far.  Continue use of incentive spirometry/flutter valve-I have asked nursing staff to see if he can ambulate patient and get her out of bed to chair this morning.  SIRS due to community-acquired pneumonia: SIRS physiology improving-on empiric IV antibiotics.  Cultures are negative so far.  Acute urinary retention: Due to acute illness-Foley now removed-voiding spontaneously.  Diarrhea: Had diarrhea-stool studies equivocal-given prior history of C. difficile colitis-being treated with oral vancomycin.  Diarrhea almost essentially resolved-we will empirically plan on at least 10 days of treatment with oral vancomycin.  Plan is to stop Rocephin after 5 days of treatment.   Hypokalemia: Repleted  HTN: BP now creeping up-resume amlodipine and follow.   AKI: Mild-likely hemodynamically mediated-creatinine has improved.  Restless leg syndrome: Continue Requip  Anxiety: Continue as needed Xanax/Celexa  BMI Estimated body mass  index is 29.8 kg/m as calculated from the following:   Height as of an earlier encounter on 04/25/21: 5\' 3"  (1.6 m).   Weight as of this encounter: 76.3 kg.    Procedures: None Consults: PCCM. DVT Prophylaxis: Lovenox Code Status:Full code Family Communication: None at bedside  Time spent: 25 minutes-Greater than 50% of this time was spent in counseling, explanation of diagnosis, planning of further management, and coordination of care.   Disposition Plan: Status is: Inpatient  Remains inpatient appropriate because: Acute on chronic hypoxic respiratory failure due to COPD exacerbation provoked by PNA-improving hypoxemia-although improved-not yet at baseline-not yet stable for discharge.   Diet: Diet Order             Diet Heart Room service  appropriate? Yes; Fluid consistency: Thin  Diet effective now                     Antimicrobial agents: Anti-infectives (From admission, onward)    Start     Dose/Rate Route Frequency Ordered Stop   04/27/21 1130  vancomycin (VANCOCIN) capsule 125 mg        125 mg Oral 4 times daily 04/27/21 1038 05/07/21 0959   04/27/21 0000  cefTRIAXone (ROCEPHIN) 2 g in sodium chloride 0.9 % 100 mL IVPB        2 g 200 mL/hr over 30 Minutes Intravenous Every 24 hours 04/26/21 0125 05/01/21 2359   04/26/21 1000  azithromycin (ZITHROMAX) tablet 500 mg        500 mg Oral Daily 04/26/21 0832 04/28/21 0802   04/26/21 0200  metroNIDAZOLE (FLAGYL) IVPB 500 mg  Status:  Discontinued        500 mg 100 mL/hr over 60 Minutes Intravenous Every 8 hours 04/26/21 0140 04/26/21 0834   04/26/21 0130  cefTRIAXone (ROCEPHIN) 2 g in sodium chloride 0.9 % 100 mL IVPB        2 g 200 mL/hr over 30 Minutes Intravenous  Once 04/26/21 0129 04/26/21 0237        MEDICATIONS: Scheduled Meds:  amLODipine  5 mg Oral Daily   benzonatate  200 mg Oral TID   citalopram  60 mg Oral Daily   enoxaparin (LOVENOX) injection  40 mg Subcutaneous Q24H   feeding supplement  237 mL Oral BID BM   fluticasone  2 spray Each Nare Daily   gabapentin  300 mg Oral BID   guaiFENesin  1,200 mg Oral BID   ipratropium-albuterol  3 mL Nebulization TID   loratadine  10 mg Oral Daily   montelukast  10 mg Oral QHS   multivitamin with minerals  1 tablet Oral Daily   pantoprazole  40 mg Oral BID AC   predniSONE  40 mg Oral Q breakfast   rOPINIRole  1-2 mg Oral QHS   vancomycin  125 mg Oral QID   Continuous Infusions:  cefTRIAXone (ROCEPHIN)  IV 2 g (04/30/21 0044)   PRN Meds:.acetaminophen **OR** acetaminophen, albuterol, ALPRAZolam, chlorpheniramine-HYDROcodone, HYDROcodone-acetaminophen, menthol-cetylpyridinium, ondansetron **OR** ondansetron (ZOFRAN) IV   I have personally reviewed following labs and imaging studies  LABORATORY  DATA:  Recent Labs  Lab 04/25/21 1946 04/26/21 0006 04/26/21 0129 04/26/21 0337 04/27/21 0152 04/30/21 0028  WBC 10.7*  --  6.7  --  11.3* 14.9*  HGB 13.3 13.6 12.7 13.3 11.5* 13.6  HCT 40.8 40.0 38.9 39.0 36.0 40.9  PLT 296  --  255  --  254 410*  MCV 97.6  --  98.2  --  97.3 95.3  MCH 31.8  --  32.1  --  31.1 31.7  MCHC 32.6  --  32.6  --  31.9 33.3  RDW 14.6  --  14.5  --  14.7 14.9  LYMPHSABS  --   --  0.5*  --   --   --   MONOABS  --   --  0.2  --   --   --   EOSABS  --   --  0.0  --   --   --   BASOSABS  --   --  0.0  --   --   --     Recent Labs  Lab 04/25/21 1946 04/26/21 0006 04/26/21 0129 04/26/21 0220 04/26/21 0337 04/26/21 0700 04/27/21 0152 04/28/21 0117 04/30/21 0028  NA 136   < > 133*  --  134*  --  134* 135 136  K 3.5   < > 3.2*  --  3.2*  --  4.2 4.9 4.0  CL 93*  --  94*  --   --   --  100 99 98  CO2 30  --  27  --   --   --  26 28 31   GLUCOSE 96  --  211*  --   --   --  194* 152* 104*  BUN 18  --  19  --   --   --  16 24* 23*  CREATININE 1.10*  --  1.02*  --   --   --  0.95 1.12* 0.89  CALCIUM 8.6*  --  8.4*  --   --   --  8.4* 8.9 8.8*  AST 36  --  35  --   --   --  19  --   --   ALT 32  --  31  --   --   --  20  --   --   ALKPHOS 153*  --  158*  --   --   --  133*  --   --   BILITOT 0.9  --  0.5  --   --   --  0.5  --   --   ALBUMIN 3.3*  --  3.0*  --   --   --  2.5*  --   --   MG  --   --  2.6*  --   --   --  2.2  --   --   PROCALCITON  --   --  0.33  --   --   --   --   --   --   LATICACIDVEN  --   --   --  2.3*  --  2.5*  --   --   --   TSH  --   --   --  0.698  --   --   --   --   --   BNP 34.3  --   --   --   --   --   --   --   --    < > = values in this interval not displayed.          RADIOLOGY STUDIES/RESULTS: No results found.   LOS: 5 days   Signature  Lala Lund M.D on 04/30/2021 at 11:06 AM   -  To page go to [email or web address]

## 2021-04-30 NOTE — Evaluation (Signed)
Occupational Therapy Evaluation Patient Details Name: Vicki [name] MRN: [id number] DOB: 05/27/1965 Today's Date: 04/30/2021   History of Present Illness 55 y.o. female that presented to the ED with fever, cough, congestion, headaches, and diarrhea x 4 days. Pt admitted with COPD exacerbation and AKI.  PMH includes COPD, HTN, C-diff and diverticulitis.   Clinical Impression   PTA, pt was living at home with her spouse, pt reports she was independent with ADL/IADL and functional mobility. Pt reports limitations with showering due to cardiopulmonary function. Pt currently requires supervision to minguard assistance for ADL completion and functional mobility in the room. Pt on 3lnc throughout session, SpO2 93% at rest and 88% with ambulation (~66feet) in the room. Pt educated on pursed lip breathing, activity progression, energy conservation strategies and importance of monitoring O2 during activities. Due to decline in current level of function, pt would benefit from acute OT to address established goals to facilitate safe D/C to venue listed below. Pt may benefit from pulmonary rehab after d/c. No follow-up OT recommended at this time. Will continue to follow acutely.    Pt reports she was ambulating to the bathroom independently. Pt reports she was removing O2 and disconnecting from monitors. Pt reports she was afraid to trip over the lines.  Pt reports when she would return from the bathroom she would feel dizzy, sob, and O2 would be in the low 80s. Educated pt on importance of maintaining O2 with exertion and calling for staff when needing to ambulate in the room. Communicated this with RN.      Recommendations for follow up therapy are one component of a multi-disciplinary discharge planning process, led by the attending physician.  Recommendations may be updated based on patient status, additional functional criteria and insurance authorization.   Follow Up Recommendations  No OT follow  up  Pulmonary Rehab    Assistance Recommended at Discharge Intermittent Supervision/Assistance  Functional Status Assessment  Patient has had a recent decline in their functional status and demonstrates the ability to make significant improvements in function in a reasonable and predictable amount of time.  Equipment Recommendations       Recommendations for Other Services PT consult     Precautions / Restrictions Precautions Precautions: Fall Precaution Comments: watch O2, enteric prec Restrictions Weight Bearing Restrictions: No      Mobility Bed Mobility Overal bed mobility: Modified Independent             General bed mobility comments: HOB elevated    Transfers Overall transfer level: Modified independent Equipment used: None                      Balance Overall balance assessment: Mild deficits observed, not formally tested                                         ADL either performed or assessed with clinical judgement   ADL Overall ADL's : Needs assistance/impaired Eating/Feeding: Independent   Grooming: Supervision/safety;Standing Grooming Details (indicate cue type and reason): at sink level Upper Body Bathing: Independent   Lower Body Bathing: Supervison/ safety;Sit to/from stand   Upper Body Dressing : Independent   Lower Body Dressing: Supervision/safety;Sit to/from stand   Toilet Transfer: Min guard;Ambulation Toilet Transfer Details (indicate cue type and reason): assistance with line management Toileting- Clothing Manipulation and Hygiene: Modified independent;Sit to/from stand  Functional mobility during ADLs: Min guard General ADL Comments: pt limited by cardiopulmonary status, decreased activity tolerance, and risk for falling     Vision Baseline Vision/History: 0 No visual deficits Ability to See in Adequate Light: 0 Adequate Patient Visual Report: No change from baseline Vision Assessment?: No  apparent visual deficits     Perception     Praxis      Pertinent Vitals/Pain Pain Assessment: No/denies pain     Hand Dominance Left   Extremity/Trunk Assessment Upper Extremity Assessment Upper Extremity Assessment: Overall WFL for tasks assessed   Lower Extremity Assessment Lower Extremity Assessment: Overall WFL for tasks assessed   Cervical / Trunk Assessment Cervical / Trunk Assessment: Normal   Communication Communication Communication: No difficulties   Cognition Arousal/Alertness: Awake/alert Behavior During Therapy: WFL for tasks assessed/performed Overall Cognitive Status: Within Functional Limits for tasks assessed                                       General Comments  pt with decreased activity tolerance. Pt on 3lnc upon arrival SpO2 92% at rest. SpO2 89% on 3lnc with ambulation in room from bed to bathroom. Pt required seated rest break to return SpO2 >92%.    Exercises Exercises: Other exercises Other Exercises Other Exercises: began education on ECS to implement during ADL/IADL   Shoulder Instructions      Home Living Family/patient expects to be discharged to:: Private residence Living Arrangements: Spouse/significant other;Children Available Help at Discharge: Family Type of Home: House Home Access: Stairs to enter Technical brewer of Steps: 2 Entrance Stairs-Rails: None Home Layout: One level     Bathroom Shower/Tub: Teacher, early years/pre: Standard Bathroom Accessibility: Yes How Accessible: Accessible via walker Home Equipment: [place] - single point;Rolling Walker (2 wheels)          Prior Functioning/Environment Prior Level of Function : Independent/Modified Independent;Driving;History of Falls (last six months)             Mobility Comments: independent without device. pt fell about 2 months ago and reports she tripped over the O2 line and hit her head, reports hitting shoulder on the ground  first. ADLs Comments: modified independent, increased time. Pt reports showers took a long time and left her feeling tired/winded. pt drives short distances. son and pt do grocery shopping pt was able to walk around at a slow pace. independent with medication. Pt is on 2.5L O2 at night only.        OT Problem List: Decreased activity tolerance;Decreased safety awareness;Cardiopulmonary status limiting activity      OT Treatment/Interventions: Self-care/ADL training;Therapeutic exercise;Energy conservation;DME and/or AE instruction;Patient/family education;Balance training    OT Goals(Current goals can be found in the care plan section) Acute Rehab OT Goals Patient Stated Goal: to go home OT Goal Formulation: With patient Time For Goal Achievement: 05/14/21 Potential to Achieve Goals: Good ADL Goals Pt Will Perform Lower Body Dressing: Independently;sit to/from stand Pt Will Transfer to Toilet: Independently;ambulating Additional ADL Goal #1: Pt will demonstrate independence with monitoring O2 during ADL/IADL and functional mobility. Additional ADL Goal #2: Pt will demonstrate 3 fall prevention strategies for safe engagement in the home environment.  OT Frequency: Min 2X/week   Barriers to D/C:            Co-evaluation              AM-PAC OT "6  Clicks" Daily Activity     Outcome Measure Help from another person eating meals?: None Help from another person taking care of personal grooming?: A Little Help from another person toileting, which includes using toliet, bedpan, or urinal?: A Little Help from another person bathing (including washing, rinsing, drying)?: None Help from another person to put on and taking off regular upper body clothing?: None Help from another person to put on and taking off regular lower body clothing?: None 6 Click Score: 22   End of Session Equipment Utilized During Treatment: Oxygen Nurse Communication: Mobility status  Activity Tolerance:  Patient tolerated treatment well Patient left: in bed;with call bell/phone within reach;with bed alarm set  OT Visit Diagnosis: Other abnormalities of gait and mobility (R26.89);Muscle weakness (generalized) (M62.81);History of falling (Z91.81)                Time: 5852-7782 OT Time Calculation (min): 35 min Charges:  OT General Charges $OT Visit: 1 Visit OT Evaluation $OT Eval Moderate Complexity: 1 Mod OT Treatments $Self Care/Home Management : 8-22 mins  Helene Kelp OTR/L Acute Rehabilitation Services Office: [place] 04/30/2021, 11:18 AM

## 2021-04-30 NOTE — Progress Notes (Signed)
Pt in no distress at this time reqquiring bipap.  RT will monitor.

## 2021-05-01 ENCOUNTER — Inpatient Hospital Stay (HOSPITAL_COMMUNITY): Payer: 59

## 2021-05-01 ENCOUNTER — Other Ambulatory Visit (HOSPITAL_COMMUNITY): Payer: Self-pay

## 2021-05-01 LAB — COMPREHENSIVE METABOLIC PANEL
ALT: 68 U/L — ABNORMAL HIGH (ref 0–44)
AST: 38 U/L (ref 15–41)
Albumin: 2.5 g/dL — ABNORMAL LOW (ref 3.5–5.0)
Alkaline Phosphatase: 93 U/L (ref 38–126)
Anion gap: 10 (ref 5–15)
BUN: 22 mg/dL — ABNORMAL HIGH (ref 6–20)
CO2: 25 mmol/L (ref 22–32)
Calcium: 8 mg/dL — ABNORMAL LOW (ref 8.9–10.3)
Chloride: 97 mmol/L — ABNORMAL LOW (ref 98–111)
Creatinine, Ser: 0.83 mg/dL (ref 0.44–1.00)
GFR, Estimated: >60 mL/min (ref >60)
Glucose, Bld: 127 mg/dL — ABNORMAL HIGH (ref 70–99)
Potassium: 3.4 mmol/L — ABNORMAL LOW (ref 3.5–5.1)
Sodium: 132 mmol/L — ABNORMAL LOW (ref 135–145)
Total Bilirubin: 0.4 mg/dL (ref 0.3–1.2)
Total Protein: 5.2 g/dL — ABNORMAL LOW (ref 6.5–8.1)

## 2021-05-01 LAB — CBC WITH DIFFERENTIAL/PLATELET
Abs Immature Granulocytes: 0.62 10*3/uL — ABNORMAL HIGH (ref 0.00–0.07)
Basophils Absolute: 0 10*3/uL (ref 0.0–0.1)
Basophils Relative: 0 %
Eosinophils Absolute: 0 10*3/uL (ref 0.0–0.5)
Eosinophils Relative: 0 %
HCT: 37.7 % (ref 36.0–46.0)
Hemoglobin: 12.3 g/dL (ref 12.0–15.0)
Immature Granulocytes: 5 %
Lymphocytes Relative: 24 %
Lymphs Abs: 3 10*3/uL (ref 0.7–4.0)
MCH: 31.2 pg (ref 26.0–34.0)
MCHC: 32.6 g/dL (ref 30.0–36.0)
MCV: 95.7 fL (ref 80.0–100.0)
Monocytes Absolute: 1.3 10*3/uL — ABNORMAL HIGH (ref 0.1–1.0)
Monocytes Relative: 11 %
Neutro Abs: 7.4 10*3/uL (ref 1.7–7.7)
Neutrophils Relative %: 60 %
Platelets: 343 10*3/uL (ref 150–400)
RBC: 3.94 MIL/uL (ref 3.87–5.11)
RDW: 14.6 % (ref 11.5–15.5)
WBC: 12.5 10*3/uL — ABNORMAL HIGH (ref 4.0–10.5)
nRBC: 0.2 % (ref 0.0–0.2)

## 2021-05-01 LAB — CULTURE, BLOOD (ROUTINE X 2)
Culture: NO GROWTH
Culture: NO GROWTH
Special Requests: ADEQUATE
Special Requests: ADEQUATE

## 2021-05-01 LAB — BRAIN NATRIURETIC PEPTIDE: B Natriuretic Peptide: 69.9 pg/mL (ref 0.0–100.0)

## 2021-05-01 LAB — PROCALCITONIN: Procalcitonin: <0.1 ng/mL

## 2021-05-01 LAB — MAGNESIUM: Magnesium: 1.7 mg/dL (ref 1.7–2.4)

## 2021-05-01 MED ORDER — IPRATROPIUM-ALBUTEROL 0.5-2.5 (3) MG/3ML IN SOLN
3.0000 mL | Freq: Two times a day (BID) | RESPIRATORY_TRACT | 0 refills | Status: DC
  Filled 2021-05-01: qty 180, 30d supply, fill #0

## 2021-05-01 MED ORDER — POTASSIUM CHLORIDE CRYS ER 20 MEQ PO TBCR
40.0000 meq | EXTENDED_RELEASE_TABLET | Freq: Once | ORAL | Status: AC
  Administered 2021-05-01: 08:00:00 40 meq via ORAL
  Filled 2021-05-01: qty 2

## 2021-05-01 MED ORDER — FLUTICASONE-SALMETEROL 500-50 MCG/ACT IN AEPB
1.0000 | INHALATION_SPRAY | Freq: Two times a day (BID) | RESPIRATORY_TRACT | 0 refills | Status: DC
  Filled 2021-05-01: qty 60, 30d supply, fill #0

## 2021-05-01 MED ORDER — ALBUTEROL SULFATE HFA 108 (90 BASE) MCG/ACT IN AERS: 2.0000 | INHALATION_SPRAY | Freq: Four times a day (QID) | RESPIRATORY_TRACT | 0 refills | Status: DC | PRN

## 2021-05-01 MED ORDER — PREDNISONE 5 MG PO TABS
ORAL_TABLET | ORAL | 0 refills | Status: DC
Start: 2021-05-01 — End: 2022-04-05
  Filled 2021-05-01: qty 65, 18d supply, fill #0

## 2021-05-01 MED ORDER — VANCOMYCIN HCL 125 MG PO CAPS
125.0000 mg | ORAL_CAPSULE | Freq: Four times a day (QID) | ORAL | 0 refills | Status: AC
  Filled 2021-05-01: qty 20, 5d supply, fill #0

## 2021-05-01 NOTE — Discharge Summary (Signed)
Vicki [name] QQP:[id number] DOB: 02/27/1966 DOA: 04/25/2021  PCP: Maximiano Coss, NP  Admit date: 04/25/2021  Discharge date: 05/01/2021  Admitted From: Home   Disposition:  Home   Recommendations for Outpatient Follow-up:   Follow up with PCP in 1-2 weeks  PCP Please obtain BMP/CBC, 2 view CXR in 1week,  (see Discharge instructions)   PCP Please follow up on the following pending results:    Home Health: None   Equipment/Devices: as below  Consultations: None  Discharge Condition: Stable    CODE STATUS: Full    Diet Recommendation: Heart Healthy   Diet Order             Diet - low sodium heart healthy           Diet Heart Room service appropriate? Yes; Fluid consistency: Thin  Diet effective now                    Chief Complaint  Patient presents with   Shortness of Breath   Cough     Brief history of present illness from the day of admission and additional interim summary    Patient is a 55 y.o. female with history of COPD on home O2 who presented with cough, worsening shortness of breath for the past 3-4 days-she was found to have COPD exacerbation and community-acquired pneumonia and subsequently admitted to the hospitalist service.  See below for further details.                                                                 Hospital Course     Acute on chronic hypoxic respiratory failure due to COPD exacerbation and community-acquired pneumonia (infiltrates seen on lung cuts on CT abdomen): now much better and currently on 1 L nasal cannula oxygen at rest, upon ambulation requires between 2 and 3 L, she was treated with steroids and short course of antibiotics, now minimal wheezing and close to baseline, counseled to quit smoking.  Will be discharged home on steroid taper along  with nebulizer treatments as below.  PCP to monitor will benefit from outpatient pulmonary follow-up in 1 to 2 weeks will defer that to PCP.   SIRS due to community-acquired pneumonia: SIRS physiology improving-on empiric IV antibiotics.  Cultures are negative so far. Resolved.   Acute urinary retention: Due to acute illness-Foley now removed-voiding spontaneously.Resolved.   Diarrhea: Had diarrhea-stool studies equivocal-given prior history of C. difficile colitis-being treated with oral vancomycin.  Diarrhea almost essentially resolved-we will empirically plan 5 more days of treatment with oral vancomycin upon DC.     Hypokalemia: Repleted today.   HTN: on Home Rx.  AKI: Mild-likely hemodynamically mediated-creatinine has improved.PCP to monitor.  Restless leg syndrome: Continue Requip   Anxiety: Continue as needed Xanax/Celexa  BMI - Estimated body mass index is 29.8, follow with PCP.   Discharge diagnosis     Principal Problem:   COPD exacerbation ([place]) Active Problems:   Depression   SIRS (systemic inflammatory response syndrome) (HCC)   Hyperlipidemia   RLS (restless legs syndrome)   Tobacco use disorder   Essential hypertension, benign   Generalized anxiety disorder   Acute on chronic respiratory failure with hypoxia (HCC)   Essential hypertension   AKI (acute kidney injury) ([place])   Anxiety   Lower abdominal pain   Nausea vomiting and diarrhea    Discharge instructions    Discharge Instructions     Diet - low sodium heart healthy   Complete by: As directed    Discharge instructions   Complete by: As directed    Follow with Primary MD Maximiano Coss, NP in 7 days   Get CBC, CMP, 2 view Chest X ray -  checked next visit within 1 week by Primary MD   Activity: As tolerated with Full fall precautions use walker/cane & assistance as needed  Disposition Home    Diet: Heart Healthy   Special Instructions: If you have smoked or chewed Tobacco  in the last  2 yrs please stop smoking, stop any regular Alcohol  and or any Recreational drug use.  On your next visit with your primary care physician please Get Medicines reviewed and adjusted.  Please request your Prim.MD to go over all Hospital Tests and Procedure/Radiological results at the follow up, please get all Hospital records sent to your Prim MD by signing hospital release before you go home.  If you experience worsening of your admission symptoms, develop shortness of breath, life threatening emergency, suicidal or homicidal thoughts you must seek medical attention immediately by calling 911 or calling your MD immediately  if symptoms less severe.  You Must read complete instructions/literature along with all the possible adverse reactions/side effects for all the Medicines you take and that have been prescribed to you. Take any new Medicines after you have completely understood and accpet all the possible adverse reactions/side effects.   Increase activity slowly   Complete by: As directed        Discharge Medications   Allergies as of 05/01/2021       Reactions   Jasmine Oil Shortness Of Breath, Swelling   Levaquin [levofloxacin] Shortness Of Breath, Itching, Swelling, Rash   Penicillins Anaphylaxis, Hives   Tolerated ceftriaxone   Aleve [naproxen Sodium] Itching   Ceftin [cefuroxime Axetil] Other (See Comments)   unsure   Chlorhexidine    Depo-provera [medroxyprogesterone Acetate] Other (See Comments)   Severe acne   Doxycycline Other (See Comments)   Raises blood pressure    Oxycodone Nausea And Vomiting   Tramadol Other (See Comments)   Kept her up all night        Medication List     STOP taking these medications    eletriptan 40 MG tablet Commonly known as: RELPAX   HYDROcodone-acetaminophen 7.5-325 MG tablet Commonly known as: NORCO   Incruse Ellipta 62.5 MCG/ACT Aepb Generic drug: umeclidinium bromide   triamcinolone cream 0.1 % Commonly known as:  KENALOG       TAKE these medications    acetaminophen 500 MG tablet Commonly known as: TYLENOL Take 1,000 mg by mouth every 6 (six) hours as needed for moderate pain.   albuterol 108 (90 Base) MCG/ACT inhaler Commonly known as: VENTOLIN HFA Inhale 2 puffs into the lungs every 6 (six)  hours as needed for wheezing or shortness of breath. What changed:  when to take this reasons to take this   ALPRAZolam 1 MG tablet Commonly known as: XANAX TAKE 1 TABLET BY MOUTH 3 TIMES DAILY AS NEEDED FOR ANXIETY.   amLODipine 5 MG tablet Commonly known as: NORVASC TAKE 1 TABLET (5 MG TOTAL) BY MOUTH DAILY.   Aspirin Low Dose 81 MG EC tablet Generic drug: aspirin TAKE 1 TABLET BY MOUTH 2 TIMES DAILY. What changed:  how much to take when to take this   azelastine 0.1 % nasal spray Commonly known as: ASTELIN Place 1 spray into both nostrils 2 (two) times daily. Use in each nostril as directed   BIOFREEZE EX Apply 1 application topically daily as needed (back pain).   cetirizine 10 MG tablet Commonly known as: ZYRTEC Take 1 tablet (10 mg total) by mouth daily.   citalopram 20 MG tablet Commonly known as: CELEXA Take 3 tablets (60 mg total) by mouth daily.   dapsone 25 MG tablet TAKE 1 TABLET BY MOUTH DAILY FOR 14 DAYS, THEN 2 TABLETS DAILY.   EPINEPHrine 0.3 mg/0.3 mL Soaj injection Commonly known as: EpiPen 2-Pak Inject 0.3 mg into the muscle as needed for anaphylaxis.   famotidine 20 MG tablet Commonly known as: PEPCID TAKE 1 TABLET BY MOUTH TWICE A DAY (MORNING AND NIGHT) What changed: See the new instructions.   fexofenadine 180 MG tablet Commonly known as: ALLEGRA Take 1 tablet (180 mg total) by mouth daily.   Fluticasone-Salmeterol 500-50 MCG/DOSE Aepb Commonly known as: ADVAIR Inhale 1 puff into the lungs in the morning and at bedtime.   gabapentin 300 MG capsule Commonly known as: NEURONTIN Take 1 capsule (300 mg total) by mouth 2 (two) times daily.    hydrOXYzine 25 MG tablet Commonly known as: ATARAX Take 0.5-1 tablets (12.5-25 mg total) by mouth 3 (three) times daily as needed. What changed: reasons to take this   ipratropium-albuterol 0.5-2.5 (3) MG/3ML Soln Commonly known as: DUONEB Take 3 mLs by nebulization in the morning and at bedtime.   meclizine 25 MG tablet Commonly known as: ANTIVERT Take 1 tablet (25 mg total) by mouth 3 (three) times daily as needed for dizziness.   montelukast 10 MG tablet Commonly known as: SINGULAIR TAKE 1 TABLET BY MOUTH EVERYDAY AT BEDTIME What changed: See the new instructions.   omeprazole 20 MG capsule Commonly known as: PRILOSEC Take 1 capsule (20 mg total) by mouth 2 (two) times daily. Take 1 capsule (20 mg total) by mouth 2 (two) times daily.   ondansetron 8 MG tablet Commonly known as: ZOFRAN Take 1 tablet (8 mg total) by mouth every 8 (eight) hours as needed for nausea or vomiting.   prazosin 1 MG capsule Commonly known as: MINIPRESS TAKE 1 TO 2 CAPSULES (1 TO 2 MG TOTAL) BY MOUTH AT BEDTIME. What changed:  how much to take how to take this when to take this additional instructions   predniSONE 5 MG tablet Commonly known as: DELTASONE Label  & dispense according to the schedule below. take 8 Pills PO for 3 days, 6 Pills PO for 3 days, 4 Pills PO for 3 days, 2 Pills PO for 3 days, 1 Pills PO for 3 days, 1/2 Pill  PO for 3 days then STOP. Total 65 pills.   promethazine 25 MG tablet Commonly known as: PHENERGAN Take 1 tablet (25 mg total) by mouth every 8 (eight) hours as needed for nausea or vomiting.  rizatriptan 10 MG tablet Commonly known as: Maxalt Take 1 tablet (10 mg total) by mouth as needed for migraine. May repeat in 2 hours if needed   rOPINIRole 2 MG tablet Commonly known as: REQUIP Take 0.5-1 tablets (1-2 mg total) by mouth at bedtime.   Stiolto Respimat 2.5-2.5 MCG/ACT Aers Generic drug: Tiotropium Bromide-Olodaterol Inhale 1 puff into the lungs daily.    traZODone 50 MG tablet Commonly known as: DESYREL Take 1 tablet (50 mg total) by mouth at bedtime.   vancomycin 125 MG capsule Commonly known as: VANCOCIN Take 1 capsule (125 mg total) by mouth 4 (four) times daily for 5 days.               Durable Medical Equipment  (From admission, onward)           Start     Ordered   05/01/21 0802  For home use only DME oxygen  Once       Question Answer Comment  Length of Need 6 Months   Mode or (Route) Nasal cannula   Liters per Minute 2   Frequency Continuous (stationary and portable oxygen unit needed)   Oxygen conserving device Yes   Oxygen delivery system Gas      05/01/21 0801   04/30/21 1103  For home use only DME Nebulizer/meds  Once       Question Answer Comment  Patient needs a nebulizer to treat with the following condition COPD (chronic obstructive pulmonary disease) ([place])   Length of Need 6 Months      04/30/21 1102             Follow-up Information     Maximiano Coss, NP. Schedule an appointment as soon as possible for a visit in 1 week(s).   Specialty: Adult Health Nurse Practitioner Contact information: [place] [address] [phone]                 Major procedures and Radiology Reports - PLEASE review detailed and final reports thoroughly  -       CT ABDOMEN PELVIS W CONTRAST  Result Date: 04/26/2021 CLINICAL DATA:  55 year old female with history of left lower quadrant abdominal pain. EXAM: CT ABDOMEN AND PELVIS WITH CONTRAST TECHNIQUE: Multidetector CT imaging of the abdomen and pelvis was performed using the standard protocol following bolus administration of intravenous contrast. CONTRAST:  70mL OMNIPAQUE IOHEXOL 300 MG/ML  SOLN COMPARISON:  CT the abdomen and pelvis 10/18/2020. FINDINGS: Lower chest: Patchy peribronchovascular ground-glass attenuation and nodular airspace disease noted in the periphery of the lung bases bilaterally. Hepatobiliary: Diffuse low  attenuation throughout the hepatic parenchyma, suggestive of a background of hepatic steatosis. No suspicious cystic or solid hepatic lesions. No intra or extrahepatic biliary ductal dilatation. Status post cholecystectomy. Pancreas: No pancreatic mass. No pancreatic ductal dilatation. No pancreatic or peripancreatic fluid collections or inflammatory changes. Spleen: Unremarkable. Adrenals/Urinary Tract: Bilateral kidneys and adrenal glands are normal in appearance. No hydroureteronephrosis. Urinary bladder is normal in appearance. Stomach/Bowel: The appearance of the stomach is normal. No pathologic dilatation of small bowel or colon. Numerous colonic diverticulae are noted, particularly in the sigmoid colon, without surrounding inflammatory changes to suggest an acute diverticulitis at this time. Normal appendix. Vascular/Lymphatic: Aortic atherosclerosis. No aneurysm or dissection noted in the abdominal or pelvic vasculature. No lymphadenopathy noted in the abdomen or pelvis. Reproductive: Status post hysterectomy. Ovaries are not confidently identified may be surgically absent or atrophic. Other: No significant volume of ascites.  No pneumoperitoneum. Musculoskeletal: There are no aggressive appearing lytic or blastic lesions noted in the visualized portions of the skeleton. IMPRESSION: 1. No acute findings are noted in the abdomen or pelvis to account for the patient's symptoms. 2. There is extensive colonic diverticulosis, particularly in the sigmoid colon, but no definitive imaging findings to clearly indicate an acute diverticulitis at this time. 3. Probable hepatic steatosis. 4. Aortic atherosclerosis. 5. Findings in the lung bases which may suggest multilobar bilateral bronchopneumonia. Correlation with chest x-ray is recommended. Electronically Signed   By: Vinnie Langton M.D.   On: 04/26/2021 06:53   DG Chest Port 1 View  Result Date: 05/01/2021 CLINICAL DATA:  Shortness of breath. EXAM: PORTABLE  CHEST 1 VIEW COMPARISON:  Portable chest 04/28/2021. FINDINGS: The heart size and mediastinal contours are within normal limits apart from slight aortic uncoiling. Both lungs are clear. The visualized skeletal structures are unremarkable. IMPRESSION: No active disease or interval changes. Electronically Signed   By: Telford Nab M.D.   On: 05/01/2021 05:55   DG Chest Port 1 View  Result Date: 04/28/2021 CLINICAL DATA:  Shortness of breath. EXAM: PORTABLE CHEST 1 VIEW COMPARISON:  Chest x-ray dated April 25, 2021. FINDINGS: The heart size and mediastinal contours are within normal limits. Both lungs are clear. The visualized skeletal structures are unremarkable. IMPRESSION: No active disease. Electronically Signed   By: Titus Dubin M.D.   On: 04/28/2021 09:02   DG Chest Portable 1 View  Result Date: 04/25/2021 CLINICAL DATA:  Short of breath, headache EXAM: PORTABLE CHEST 1 VIEW COMPARISON:  10/18/2020 FINDINGS: Single frontal view of the chest demonstrates an unremarkable cardiac silhouette. Chronic background interstitial scarring without acute airspace disease, effusion, or pneumothorax. No acute bony abnormalities. IMPRESSION: 1. No acute intrathoracic process. Electronically Signed   By: Randa Ngo M.D.   On: 04/25/2021 20:26     Today   Subjective    Vicki [name] today has no headache,no chest abdominal pain,no new weakness tingling or numbness, feels much better wants to go home today.     Objective   Blood pressure 114/85, pulse 85, temperature 98 F (36.7 C), temperature source Oral, resp. rate 14, weight 76.3 kg, SpO2 95 %.   Intake/Output Summary (Last 24 hours) at 05/01/2021 0923 Last data filed at 05/01/2021 0820 Gross per 24 hour  Intake 480 ml  Output 1025 ml  Net -545 ml    Exam  Awake Alert, No new F.N deficits, Normal affect [place].AT,PERRAL Supple Neck,No JVD, No cervical lymphadenopathy appriciated.  Symmetrical Chest wall movement, Good air  movement bilaterally, mild wheezing RRR,No Gallops,Rubs or new Murmurs, No Parasternal Heave +ve B.Sounds, Abd Soft, Non tender, No organomegaly appriciated, No rebound -guarding or rigidity. No Cyanosis, Clubbing or edema, No new Rash or bruise   Data Review   CBC w Diff:  Lab Results  Component Value Date   WBC 12.5 (H) 05/01/2021   HGB 12.3 05/01/2021   HGB 15.4 10/07/2020   HCT 37.7 05/01/2021   HCT 46.4 10/07/2020   PLT 343 05/01/2021   PLT 149 (L) 08/17/2019   LYMPHOPCT 24 05/01/2021   MONOPCT 11 05/01/2021   EOSPCT 0 05/01/2021   BASOPCT 0 05/01/2021    CMP:  Lab Results  Component Value Date   NA 132 (L) 05/01/2021   NA 140 10/07/2020   K 3.4 (L) 05/01/2021   CL 97 (L) 05/01/2021   CO2 25 05/01/2021   BUN 22 (H) 05/01/2021   BUN 12  10/07/2020   CREATININE 0.83 05/01/2021   PROT 5.2 (L) 05/01/2021   PROT 7.0 10/07/2020   ALBUMIN 2.5 (L) 05/01/2021   ALBUMIN 4.3 10/07/2020   BILITOT 0.4 05/01/2021   BILITOT 0.2 10/07/2020   ALKPHOS 93 05/01/2021   AST 38 05/01/2021   ALT 68 (H) 05/01/2021  .   Total Time in preparing paper work, data evaluation and todays exam - 60 minutes  Lala Lund M.D on 05/01/2021 at 9:23 AM  [facility]

## 2021-05-01 NOTE — TOC Transition Note (Signed)
Transition of Care [facility]) - CM/SW Discharge Note   Patient Details  Name: Vicki [name] MRN: [id number] Date of Birth: 1965-09-04  Transition of Care [facility] CM/SW Contact:  Ninfa Meeker, RN Phone Number: 05/01/2021, 9:18 AM   Clinical Narrative:   Case manager spoke with patient concerning oxygen needs. She states she has oxygen at home but needs a tank for travel. Her oxygen is provided through [place]. CM contacted Caryl Pina with [employer] and a portable tank will be delivered to patient's room this morning. No further needs identified.   Final next level of care: Home/Self Care Barriers to Discharge: No Barriers Identified   Patient Goals and CMS Choice        Discharge Placement                       Discharge Plan and Services   Discharge Planning Services: CM Consult Post Acute Care Choice: Durable Medical Equipment          DME Arranged: Oxygen DME Agency: [facility] Date DME Agency Contacted: 05/01/21 Time DME Agency Contacted: [phone] Representative spoke with at DME Agency: Caryl Pina            Social Determinants of Health ([place]) Interventions     Readmission Risk Interventions No flowsheet data found.

## 2021-05-01 NOTE — Discharge Instructions (Signed)
Follow with Primary MD Maximiano Coss, NP in 7 days   Get CBC, CMP, 2 view Chest X ray -  checked next visit within 1 week by Primary MD   Activity: As tolerated with Full fall precautions use walker/cane & assistance as needed  Disposition Home    Diet: Heart Healthy   Special Instructions: If you have smoked or chewed Tobacco  in the last 2 yrs please stop smoking, stop any regular Alcohol  and or any Recreational drug use.  On your next visit with your primary care physician please Get Medicines reviewed and adjusted.  Please request your Prim.MD to go over all Hospital Tests and Procedure/Radiological results at the follow up, please get all Hospital records sent to your Prim MD by signing hospital release before you go home.  If you experience worsening of your admission symptoms, develop shortness of breath, life threatening emergency, suicidal or homicidal thoughts you must seek medical attention immediately by calling 911 or calling your MD immediately  if symptoms less severe.  You Must read complete instructions/literature along with all the possible adverse reactions/side effects for all the Medicines you take and that have been prescribed to you. Take any new Medicines after you have completely understood and accpet all the possible adverse reactions/side effects.

## 2021-05-04 ENCOUNTER — Telehealth: Payer: Self-pay | Admitting: Registered Nurse

## 2021-05-04 ENCOUNTER — Telehealth: Payer: Self-pay

## 2021-05-04 NOTE — Telephone Encounter (Signed)
Pt called in asking if some pain medication could be sent in for her? She states that she was just discharged from the hosp and is in pain. She has been taking hydrocodone but it keeps her up at night wanted to know if anything else can be sent in. She states that she can't go over the weekend without anything.   Please advise in [place] absence   She has a hosp f/up on Tuesday

## 2021-05-04 NOTE — Telephone Encounter (Signed)
Transition Care Management Follow-up Telephone Call Date of discharge and from where: 05/01/2021  Vicki [name]  How have you been since you were released from the hospital? better Any questions or concerns? No  Items Reviewed: Did the pt receive and understand the discharge instructions provided? Yes  Medications obtained and verified? Yes Patient upset over discharge  Other? No  Any new allergies since your discharge? No  Dietary orders reviewed? No Do you have support at home? Yes   Home Care and Equipment/Supplies: Were home health services ordered? yes If so, what is the name of the agency? Unknown patient has not had any contact   Has the agency set up a time to come to the patient's home? yes Were any new equipment or medical supplies ordered?  Yes: home   What is the name of the medical supply agency? [facility]  Were you able to get the supplies/equipment? yes Do you have any questions related to the use of the equipment or supplies? No   Functional Questionnaire: (I = Independent and D = Dependent) ADLs: I  Bathing/Dressing- I  Meal Prep- I  Eating- I  Maintaining continence- I  Transferring/Ambulation- I  Managing Meds- I  Follow up appointments reviewed:  PCP Hospital f/u appt confirmed? Yes  Scheduled to see 05/09/2021 on Maximiano Coss ,NP @ 110pm [place] [facility] f/u appt confirmed? No   Are transportation arrangements needed? No  If their condition worsens, is the pt aware to call PCP or go to the Emergency Dept.? Yes Was the patient provided with contact information for the PCP's office or ED? Yes Was to pt encouraged to call back with questions or concerns? Yes

## 2021-05-04 NOTE — Telephone Encounter (Signed)
Transition Care Management Unsuccessful Follow-up Telephone Call  Date of discharge and from where:  [facility] 05/01/[facility]21  Attempts:  1st Attempt  Reason for unsuccessful TCM follow-up call:  Left voice message

## 2021-05-05 NOTE — Telephone Encounter (Signed)
Contacted patient via my chart message in regards to pain medication

## 2021-05-05 NOTE — Telephone Encounter (Signed)
She was hospitalized for a COPD exacerbation and pneumonia.  And on her discharge instructions it actually says she should stop her hydrocodone.  The note doesn't mention anything about pain.  At this time, I don't feel sending in pain medications is appropriate

## 2021-05-09 ENCOUNTER — Ambulatory Visit (INDEPENDENT_AMBULATORY_CARE_PROVIDER_SITE_OTHER): Payer: 59 | Admitting: Registered Nurse

## 2021-05-09 ENCOUNTER — Encounter: Payer: Self-pay | Admitting: Registered Nurse

## 2021-05-09 ENCOUNTER — Other Ambulatory Visit: Payer: Self-pay

## 2021-05-09 VITALS — BP 120/78 | HR 100 | Temp 98.6°F | Resp 18 | Ht 63.0 in | Wt 166.0 lb

## 2021-05-09 DIAGNOSIS — F32A Depression, unspecified: Secondary | ICD-10-CM

## 2021-05-09 DIAGNOSIS — Z9981 Dependence on supplemental oxygen: Secondary | ICD-10-CM | POA: Diagnosis not present

## 2021-05-09 DIAGNOSIS — F17209 Nicotine dependence, unspecified, with unspecified nicotine-induced disorders: Secondary | ICD-10-CM

## 2021-05-09 DIAGNOSIS — F321 Major depressive disorder, single episode, moderate: Secondary | ICD-10-CM | POA: Insufficient documentation

## 2021-05-09 DIAGNOSIS — R0602 Shortness of breath: Secondary | ICD-10-CM | POA: Diagnosis not present

## 2021-05-09 DIAGNOSIS — I700 Atherosclerosis of aorta: Secondary | ICD-10-CM

## 2021-05-09 DIAGNOSIS — Z09 Encounter for follow-up examination after completed treatment for conditions other than malignant neoplasm: Secondary | ICD-10-CM | POA: Diagnosis not present

## 2021-05-09 LAB — COMPREHENSIVE METABOLIC PANEL
ALT: 20 U/L (ref 0–35)
AST: 16 U/L (ref 0–37)
Albumin: 3.7 g/dL (ref 3.5–5.2)
Alkaline Phosphatase: 91 U/L (ref 39–117)
BUN: 16 mg/dL (ref 6–23)
CO2: 29 mEq/L (ref 19–32)
Calcium: 9.1 mg/dL (ref 8.4–10.5)
Chloride: 98 mEq/L (ref 96–112)
Creatinine, Ser: 0.7 mg/dL (ref 0.40–1.20)
GFR: 98.65 mL/min (ref >60.00)
Glucose, Bld: 87 mg/dL (ref 70–99)
Potassium: 4.2 mEq/L (ref 3.5–5.1)
Sodium: 136 mEq/L (ref 135–145)
Total Bilirubin: 0.7 mg/dL (ref 0.2–1.2)
Total Protein: 6.3 g/dL (ref 6.0–8.3)

## 2021-05-09 LAB — CBC WITH DIFFERENTIAL/PLATELET
Basophils Absolute: 0 10*3/uL (ref 0.0–0.1)
Basophils Relative: 0.2 % (ref 0.0–3.0)
Eosinophils Absolute: 0.1 10*3/uL (ref 0.0–0.7)
Eosinophils Relative: 0.6 % (ref 0.0–5.0)
HCT: 39 % (ref 36.0–46.0)
Hemoglobin: 12.9 g/dL (ref 12.0–15.0)
Lymphocytes Relative: 5.9 % — ABNORMAL LOW (ref 12.0–46.0)
Lymphs Abs: 1 10*3/uL (ref 0.7–4.0)
MCHC: 33.1 g/dL (ref 30.0–36.0)
MCV: 94.9 fl (ref 78.0–100.0)
Monocytes Absolute: 0.7 10*3/uL (ref 0.1–1.0)
Monocytes Relative: 4.2 % (ref 3.0–12.0)
Neutro Abs: 14.4 10*3/uL — ABNORMAL HIGH (ref 1.4–7.7)
Neutrophils Relative %: 89.1 % — ABNORMAL HIGH (ref 43.0–77.0)
Platelets: 365 10*3/uL (ref 150.0–400.0)
RBC: 4.12 Mil/uL (ref 3.87–5.11)
RDW: 15.1 % (ref 11.5–15.5)
WBC: 16.1 10*3/uL — ABNORMAL HIGH (ref 4.0–10.5)

## 2021-05-09 NOTE — Progress Notes (Signed)
Established Patient Office Visit  Subjective:  Patient ID: Vicki [name], female    DOB: January 16, 1966  Age: 55 y.o. MRN: [id number]  CC:  Chief Complaint  Patient presents with   Hospitalization Follow-up    Patient states she was in the hospital for 7 days for c diff.    HPI Vicki [name] presents for hospital follow up   Presented ton 04/25/21 with fever, cough, congestion, shob Had increased home O2 to 4.5 L/min Had developed lower abdominal pain with nvd, similar to previous diverticulitis x 4 days.  On presentation was covid negative, tachycardic to 110, tachypnea to 24 rr Temp wnl. SpO2 to 94%   Found to be significantly dehydrated with AKI Acute on Chronic respiratory failure with hypoxia  Dvt prophylaxis with lovenox  Initially expected a two night stay. Unfortunately noted to have C Diff on stool culture. Had previously had this around 6 months ago. Comment on culture notes little toxin production but clinical presentation warranted treatment.   Stable enough to Dc after 6 days. Continued on 5 days of oral vanco after dc for c. Diff. Recommended to recheck CBC, cmp, cxr one week after dc. Renal function appears to have recovered somewhat with Cr down to 0.83 and GFR >60.  Since hospitalization, pt reports: Feeling better, O2 levels stable.  O2 back down to 2-3 L /min at home when needs   Notes her nebulizer machine had been recalled - may need order for new machine Notes her home oxygen is not lasting as long as she had anticipated.  Plans to call [facility] to have this addressed.   Past Medical History:  Diagnosis Date   Allergy    seasonal allergies   Anxiety    on meds   Arthritis    hands   Asthma    childhood-current   CAP (community acquired pneumonia) 04/07/2019   Carpal tunnel syndrome    had bilateral sx to tx   Cataract    bilateral--no sx yet   COPD (chronic obstructive pulmonary disease) ([place])    Depression    on meds    Diverticulitis    Emphysema of lung ([place])    uses MDI   Essential hypertension, benign 07/14/2019   on meds   Hyperlipidemia    on meds   Oxygen deficiency    home O2 at 2.5L/[place] nightly   Oxygen dependent    uses O2 at 2.5L/[place] nightly    Past Surgical History:  Procedure Laterality Date   ABDOMINAL HYSTERECTOMY  2009   APPENDECTOMY     ARTHROSCOPIC REPAIR ACL Left 2011/2014   x 2   BIOPSY  01/21/2020   Procedure: BIOPSY;  Surgeon: Mauri Pole, MD;  Location: [facility];  Service: Endoscopy;;  EGD and COLON   BUNIONECTOMY  2006   both   CARPAL TUNNEL RELEASE Bilateral 10/28/2013   Procedure: BILATERAL CARPAL TUNNEL RELEASE;  Surgeon: Wynonia Sours, MD;  Location: [facility];  Service: Orthopedics;  Laterality: Bilateral;   CHOLECYSTECTOMY  1994   CHONDROPLASTY Right 08/31/2020   Procedure: CHONDROPLASTY;  Surgeon: Earlie Server, MD;  Location: [facility];  Service: Orthopedics;  Laterality: Right;   COLONOSCOPY WITH PROPOFOL N/A 01/21/2020   Procedure: COLONOSCOPY WITH PROPOFOL;  Surgeon: Mauri Pole, MD;  Location: [facility];  Service: Endoscopy;  Laterality: N/A;   DILATION AND CURETTAGE OF UTERUS     ESOPHAGOGASTRODUODENOSCOPY (EGD) WITH PROPOFOL N/A 01/21/2020   Procedure: ESOPHAGOGASTRODUODENOSCOPY (EGD)  WITH PROPOFOL;  Surgeon: Mauri Pole, MD;  Location: [facility];  Service: Endoscopy;  Laterality: N/A;   KNEE ARTHROSCOPY WITH ANTERIOR CRUCIATE LIGAMENT (ACL) REPAIR Right 08/31/2020   Procedure: RIGHT KNEE ARTHROSCOPY WITH ANTERIOR CRUCIATE LIGAMENT (ACL) REPAIR MEDIAL AND LATERAL  MENISECTOMY;  Surgeon: Earlie Server, MD;  Location: [facility];  Service: Orthopedics;  Laterality: Right;  FEMORAL NERVE BLOCK   POLYPECTOMY  01/21/2020   Procedure: POLYPECTOMY;  Surgeon: Mauri Pole, MD;  Location: [facility];  Service: Endoscopy;;   TUBAL LIGATION  2004    Family History  Problem Relation Age of  Onset   Brain cancer Mother 65   Lung cancer Mother 24   Breast cancer Sister 73   Ovarian cancer Sister 71   Colon cancer Neg Hx    Colon polyps Neg Hx    Esophageal cancer Neg Hx    Rectal cancer Neg Hx    Stomach cancer Neg Hx     Social History   Socioeconomic History   Marital status: Married    Spouse name: [name]   Number of children: 3   Years of education: 12th grade   Highest education level: Not on file  Occupational History   Occupation: Airline pilot: [employer]  Tobacco Use   Smoking status: Every Day    Packs/day: 1.00    Years: 34.00    Pack years: 34.00    Types: Cigarettes   Smokeless tobacco: Never  Vaping Use   Vaping Use: Never used  Substance and Sexual Activity   Alcohol use: Yes    Comment: everyday   Drug use: No   Sexual activity: Yes    Birth control/protection: Surgical  Other Topics Concern   Not on file  Social History Narrative   Lives with her husband.   Their 3 children live independently.   Social Determinants of Health   Financial Resource Strain: Not on file  Food Insecurity: Not on file  Transportation Needs: Not on file  Physical Activity: Not on file  Stress: Not on file  Social Connections: Not on file  Intimate Partner Violence: Not on file    Outpatient Medications Prior to Visit  Medication Sig Dispense Refill   acetaminophen (TYLENOL) 500 MG tablet Take 1,000 mg by mouth every 6 (six) hours as needed for moderate pain.     albuterol (VENTOLIN HFA) 108 (90 Base) MCG/ACT inhaler Inhale 2 puffs into the lungs every 6 (six) hours as needed for wheezing or shortness of breath. 18 g 0   ALPRAZolam (XANAX) 1 MG tablet TAKE 1 TABLET BY MOUTH 3 TIMES DAILY AS NEEDED FOR ANXIETY. (Patient taking differently: Take 1 mg by mouth 3 (three) times daily as needed for anxiety.) 120 tablet 0   amLODipine (NORVASC) 5 MG tablet TAKE 1 TABLET (5 MG TOTAL) BY MOUTH DAILY. 90 tablet 1   ASPIRIN LOW DOSE 81 MG EC tablet  TAKE 1 TABLET BY MOUTH 2 TIMES DAILY. (Patient taking differently: Take 81 mg by mouth in the morning and at bedtime.) 180 tablet 1   azelastine (ASTELIN) 0.1 % nasal spray Place 1 spray into both nostrils 2 (two) times daily. Use in each nostril as directed 30 mL 12   cetirizine (ZYRTEC) 10 MG tablet Take 1 tablet (10 mg total) by mouth daily. 30 tablet 11   citalopram (CELEXA) 20 MG tablet Take 3 tablets (60 mg total) by mouth daily. 270 tablet 2   dapsone  25 MG tablet TAKE 1 TABLET BY MOUTH DAILY FOR 14 DAYS, THEN 2 TABLETS DAILY. 180 tablet 1   EPINEPHrine (EPIPEN 2-PAK) 0.3 mg/0.3 mL IJ SOAJ injection Inject 0.3 mg into the muscle as needed for anaphylaxis. 1 each 3   famotidine (PEPCID) 20 MG tablet TAKE 1 TABLET BY MOUTH TWICE A DAY (MORNING AND NIGHT) (Patient taking differently: Take 20 mg by mouth 2 (two) times daily at 8 am and 10 pm.) 60 tablet 1   fexofenadine (ALLEGRA) 180 MG tablet Take 1 tablet (180 mg total) by mouth daily.     fluticasone-salmeterol (ADVAIR) 500-50 MCG/ACT AEPB Inhale 1 puff into the lungs in the morning and at bedtime. 60 each 0   gabapentin (NEURONTIN) 300 MG capsule Take 1 capsule (300 mg total) by mouth 2 (two) times daily. 180 capsule 3   hydrOXYzine (ATARAX/VISTARIL) 25 MG tablet Take 0.5-1 tablets (12.5-25 mg total) by mouth 3 (three) times daily as needed. (Patient taking differently: Take 12.5-25 mg by mouth 3 (three) times daily as needed for anxiety.) 30 tablet 0   ipratropium-albuterol (DUONEB) 0.5-2.5 (3) MG/3ML SOLN Use 1 vial by nebulization in the morning and at bedtime. 360 mL 0   meclizine (ANTIVERT) 25 MG tablet Take 1 tablet (25 mg total) by mouth 3 (three) times daily as needed for dizziness. 180 tablet 1   Menthol, Topical Analgesic, (BIOFREEZE EX) Apply 1 application topically daily as needed (back pain).     montelukast (SINGULAIR) 10 MG tablet TAKE 1 TABLET BY MOUTH EVERYDAY AT BEDTIME (Patient taking differently: Take 10 mg by mouth at  bedtime.) 90 tablet 1   ondansetron (ZOFRAN) 8 MG tablet Take 1 tablet (8 mg total) by mouth every 8 (eight) hours as needed for nausea or vomiting. 60 tablet 5   prazosin (MINIPRESS) 1 MG capsule TAKE 1 TO 2 CAPSULES (1 TO 2 MG TOTAL) BY MOUTH AT BEDTIME. (Patient taking differently: Take 1-2 mg by mouth at bedtime.) 180 capsule 1   promethazine (PHENERGAN) 25 MG tablet Take 1 tablet (25 mg total) by mouth every 8 (eight) hours as needed for nausea or vomiting. 180 tablet 1   rizatriptan (MAXALT) 10 MG tablet Take 1 tablet (10 mg total) by mouth as needed for migraine. May repeat in 2 hours if needed 10 tablet 0   rOPINIRole (REQUIP) 2 MG tablet Take 0.5-1 tablets (1-2 mg total) by mouth at bedtime. 180 tablet 1   STIOLTO RESPIMAT 2.5-2.5 MCG/ACT AERS Inhale 1 puff into the lungs daily.     traZODone (DESYREL) 50 MG tablet Take 1 tablet (50 mg total) by mouth at bedtime.     omeprazole (PRILOSEC) 20 MG capsule Take 1 capsule (20 mg total) by mouth 2 (two) times daily. Take 1 capsule (20 mg total) by mouth 2 (two) times daily. 180 capsule 1   predniSONE (DELTASONE) 5 MG tablet Take 8 tablets daily for 3 days, 6 tabs daily for 3 days, 4 tabs daily for 3 days, 2 tabs daily  for 3 days, 1 tab daily for 3 days, 1/2 tab daily for 3 days then STOP 65 tablet 0   No facility-administered medications prior to visit.    Allergies  Allergen Reactions   Jasmine Oil Shortness Of Breath and Swelling   Levaquin [Levofloxacin] Shortness Of Breath, Itching, Swelling and Rash   Penicillins Anaphylaxis and Hives    Tolerated ceftriaxone   Aleve [Naproxen Sodium] Itching   Ceftin [Cefuroxime Axetil] Other (See Comments)  unsure   Chlorhexidine    Depo-Provera [Medroxyprogesterone Acetate] Other (See Comments)    Severe acne   Doxycycline Other (See Comments)    Raises blood pressure    Oxycodone Nausea And Vomiting   Tramadol Other (See Comments)    Kept her up all night    ROS Review of Systems   Constitutional: Negative.   HENT: Negative.    Eyes: Negative.   Respiratory: Negative.    Cardiovascular: Negative.   Gastrointestinal: Negative.   Genitourinary: Negative.   Musculoskeletal: Negative.   Skin: Negative.   Neurological: Negative.   Psychiatric/Behavioral: Negative.    All other systems reviewed and are negative.    Objective:    Physical Exam Vitals and nursing note reviewed.  Constitutional:      General: She is not in acute distress.    Appearance: Normal appearance. She is normal weight. She is not ill-appearing, toxic-appearing or diaphoretic.  Cardiovascular:     Rate and Rhythm: Normal rate and regular rhythm.     Heart sounds: Normal heart sounds. No murmur heard.   No friction rub. No gallop.  Pulmonary:     Effort: Pulmonary effort is normal. No respiratory distress.     Breath sounds: Normal breath sounds. No stridor. No wheezing, rhonchi or rales.  Chest:     Chest wall: No tenderness.  Skin:    General: Skin is warm and dry.  Neurological:     General: No focal deficit present.     Mental Status: She is alert and oriented to person, place, and time. Mental status is at baseline.  Psychiatric:        Mood and Affect: Mood normal.        Behavior: Behavior normal.        Thought Content: Thought content normal.        Judgment: Judgment normal.    BP 120/78    Pulse 100    Temp 98.6 F (37 C) (Temporal)    Resp 18    Ht '5\' 3"'  (1.6 m)    Wt 166 lb (75.3 kg)    SpO2 99%    BMI 29.41 kg/m  Wt Readings from Last 3 Encounters:  05/09/21 166 lb (75.3 kg)  04/27/21 168 lb 3.4 oz (76.3 kg)  04/25/21 166 lb (75.3 kg)     Health Maintenance Due  Topic Date Due   Zoster Vaccines- Shingrix (2 of 2) 05/07/2019   COVID-19 Vaccine (4 - Booster for Pfizer series) 05/17/2020   MAMMOGRAM  04/22/2021    There are no preventive care reminders to display for this patient.  Lab Results  Component Value Date   TSH 0.698 04/26/2021   Lab Results   Component Value Date   WBC 12.5 (H) 05/01/2021   HGB 12.3 05/01/2021   HCT 37.7 05/01/2021   MCV 95.7 05/01/2021   PLT 343 05/01/2021   Lab Results  Component Value Date   NA 132 (L) 05/01/2021   K 3.4 (L) 05/01/2021   CO2 25 05/01/2021   GLUCOSE 127 (H) 05/01/2021   BUN 22 (H) 05/01/2021   CREATININE 0.83 05/01/2021   BILITOT 0.4 05/01/2021   ALKPHOS 93 05/01/2021   AST 38 05/01/2021   ALT 68 (H) 05/01/2021   PROT 5.2 (L) 05/01/2021   ALBUMIN 2.5 (L) 05/01/2021   CALCIUM 8.0 (L) 05/01/2021   ANIONGAP 10 05/01/2021   EGFR 81 10/07/2020   GFR 100.80 10/27/2020   Lab Results  Component Value Date  CHOL 207 (H) 07/14/2019   Lab Results  Component Value Date   HDL 66 07/14/2019   Lab Results  Component Value Date   LDLCALC 96 07/14/2019   Lab Results  Component Value Date   TRIG 274 (H) 07/14/2019   Lab Results  Component Value Date   CHOLHDL 3.1 07/14/2019   Lab Results  Component Value Date   HGBA1C 5.4 08/18/2020      Assessment & Plan:   Problem List Items Addressed This Visit       Cardiovascular and Mediastinum   Aortic atherosclerosis ([place])     Other   Depression   Current moderate episode of major depressive disorder, unspecified whether recurrent ([place])   Other Visit Diagnoses     Hospital discharge follow-up    -  Primary   Relevant Orders   DG Chest 2 View   CBC with Differential/Platelet   Comprehensive metabolic panel   For home use only DME Nebulizer machine   Shortness of breath       Relevant Orders   For home use only DME Nebulizer machine   Oxygen dependent       Relevant Orders   For home use only DME Nebulizer machine   Tobacco use disorder, continuous           No orders of the defined types were placed in this encounter.   Follow-up: Return in about 2 years (around 05/10/2023) for follow up  - also vaccines.   PLAN Repeat CBC and cmp Order cxr Reorder nebulizer machine. She will contact [facility],  will provide support if needed. Return in 2 mo for follow up visit. Patient encouraged to call clinic with any questions, comments, or concerns.  Maximiano Coss, NP

## 2021-05-09 NOTE — Patient Instructions (Signed)
Vicki [name] -  Always nice to see you  Let's repeat CBC, CMP, and chest xray  Chest xray can be done at [id number] - they take walk ins.  I will call with any concerns on labs  Please let me know if you need anything  Thanks,  Denice Paradise

## 2021-05-10 ENCOUNTER — Other Ambulatory Visit: Payer: Self-pay | Admitting: Registered Nurse

## 2021-05-10 DIAGNOSIS — D72829 Elevated white blood cell count, unspecified: Secondary | ICD-10-CM

## 2021-05-12 ENCOUNTER — Other Ambulatory Visit: Payer: Self-pay | Admitting: Registered Nurse

## 2021-05-12 DIAGNOSIS — R112 Nausea with vomiting, unspecified: Secondary | ICD-10-CM

## 2021-05-12 DIAGNOSIS — G43909 Migraine, unspecified, not intractable, without status migrainosus: Secondary | ICD-10-CM

## 2021-05-12 DIAGNOSIS — L299 Pruritus, unspecified: Secondary | ICD-10-CM

## 2021-07-06 ENCOUNTER — Telehealth: Payer: Self-pay | Admitting: Registered Nurse

## 2021-07-06 NOTE — Telephone Encounter (Signed)
Pt called in asking for refills on Zofran, Prilosec, requip, xanax, stiolto respimat, albuterol, duoneb, and trazodone   Pt uses [facility] on [address] [employer]

## 2021-07-07 ENCOUNTER — Other Ambulatory Visit: Payer: Self-pay | Admitting: Registered Nurse

## 2021-07-07 DIAGNOSIS — F32A Depression, unspecified: Secondary | ICD-10-CM

## 2021-07-07 DIAGNOSIS — L299 Pruritus, unspecified: Secondary | ICD-10-CM

## 2021-07-07 DIAGNOSIS — G2581 Restless legs syndrome: Secondary | ICD-10-CM

## 2021-07-07 DIAGNOSIS — J302 Other seasonal allergic rhinitis: Secondary | ICD-10-CM

## 2021-07-07 DIAGNOSIS — G4700 Insomnia, unspecified: Secondary | ICD-10-CM

## 2021-07-07 DIAGNOSIS — I10 Essential (primary) hypertension: Secondary | ICD-10-CM

## 2021-07-07 DIAGNOSIS — J449 Chronic obstructive pulmonary disease, unspecified: Secondary | ICD-10-CM

## 2021-07-07 DIAGNOSIS — L270 Generalized skin eruption due to drugs and medicaments taken internally: Secondary | ICD-10-CM

## 2021-07-07 DIAGNOSIS — K219 Gastro-esophageal reflux disease without esophagitis: Secondary | ICD-10-CM

## 2021-07-07 MED ORDER — GABAPENTIN 300 MG PO CAPS: 300.0000 mg | ORAL_CAPSULE | Freq: Two times a day (BID) | ORAL | 3 refills | Status: DC

## 2021-07-07 MED ORDER — AMLODIPINE BESYLATE 5 MG PO TABS: 5.0000 mg | ORAL_TABLET | Freq: Every day | ORAL | 1 refills | Status: DC

## 2021-07-07 MED ORDER — CITALOPRAM HYDROBROMIDE 20 MG PO TABS: 60.0000 mg | ORAL_TABLET | Freq: Every day | ORAL | 2 refills | Status: DC

## 2021-07-07 MED ORDER — IPRATROPIUM-ALBUTEROL 0.5-2.5 (3) MG/3ML IN SOLN: 3.0000 mL | Freq: Two times a day (BID) | RESPIRATORY_TRACT | 0 refills | Status: DC

## 2021-07-07 MED ORDER — STIOLTO RESPIMAT 2.5-2.5 MCG/ACT IN AERS: 1.0000 | INHALATION_SPRAY | Freq: Every day | RESPIRATORY_TRACT | 12 refills | Status: DC

## 2021-07-07 MED ORDER — ROPINIROLE HCL 2 MG PO TABS: 1.0000 mg | ORAL_TABLET | Freq: Every day | ORAL | 1 refills | Status: DC

## 2021-07-07 MED ORDER — OMEPRAZOLE 20 MG PO CPDR: 20.0000 mg | DELAYED_RELEASE_CAPSULE | Freq: Two times a day (BID) | ORAL | 1 refills | Status: DC

## 2021-07-07 MED ORDER — TRAZODONE HCL 50 MG PO TABS: 50.0000 mg | ORAL_TABLET | Freq: Every day | ORAL | 3 refills | Status: DC

## 2021-07-07 MED ORDER — FLUTICASONE-SALMETEROL 500-50 MCG/ACT IN AEPB: 1.0000 | INHALATION_SPRAY | Freq: Two times a day (BID) | RESPIRATORY_TRACT | 0 refills | Status: DC

## 2021-07-07 MED ORDER — HYDROXYZINE HCL 25 MG PO TABS: 12.5000 mg | ORAL_TABLET | Freq: Three times a day (TID) | ORAL | 4 refills | Status: DC | PRN

## 2021-07-07 MED ORDER — AZELASTINE HCL 0.1 % NA SOLN: 1.0000 | Freq: Two times a day (BID) | NASAL | 12 refills | Status: DC

## 2021-07-07 MED ORDER — CETIRIZINE HCL 10 MG PO TABS: 10.0000 mg | ORAL_TABLET | Freq: Every day | ORAL | 11 refills | Status: DC

## 2021-07-07 MED ORDER — MONTELUKAST SODIUM 10 MG PO TABS: 10.0000 mg | ORAL_TABLET | Freq: Every day | ORAL | 3 refills | Status: DC

## 2021-07-07 MED ORDER — ALPRAZOLAM 1 MG PO TABS: 1.0000 mg | ORAL_TABLET | Freq: Three times a day (TID) | ORAL | 0 refills | Status: DC | PRN

## 2021-07-07 MED ORDER — ONDANSETRON HCL 8 MG PO TABS: 8.0000 mg | ORAL_TABLET | Freq: Three times a day (TID) | ORAL | 5 refills | Status: DC | PRN

## 2021-07-07 NOTE — Telephone Encounter (Signed)
Sent ? ?Thanks, ? ?Rich

## 2021-07-07 NOTE — Telephone Encounter (Signed)
Pt called in asking for refills on Zofran, Prilosec, requip, xanax, stiolto respimat, albuterol, duoneb, and trazodone    Pt uses [facility] on [address] [employer]

## 2021-07-10 ENCOUNTER — Other Ambulatory Visit: Payer: Self-pay | Admitting: Registered Nurse

## 2021-07-10 ENCOUNTER — Ambulatory Visit (INDEPENDENT_AMBULATORY_CARE_PROVIDER_SITE_OTHER): Payer: 59 | Admitting: Registered Nurse

## 2021-07-10 ENCOUNTER — Other Ambulatory Visit: Payer: Self-pay

## 2021-07-10 ENCOUNTER — Encounter: Payer: Self-pay | Admitting: Registered Nurse

## 2021-07-10 VITALS — BP 133/88 | HR 90 | Temp 97.8°F | Resp 16 | Ht 63.0 in | Wt 166.4 lb

## 2021-07-10 DIAGNOSIS — R197 Diarrhea, unspecified: Secondary | ICD-10-CM | POA: Diagnosis not present

## 2021-07-10 DIAGNOSIS — Z716 Tobacco abuse counseling: Secondary | ICD-10-CM | POA: Diagnosis not present

## 2021-07-10 DIAGNOSIS — F32A Depression, unspecified: Secondary | ICD-10-CM

## 2021-07-10 DIAGNOSIS — R252 Cramp and spasm: Secondary | ICD-10-CM | POA: Diagnosis not present

## 2021-07-10 DIAGNOSIS — A09 Infectious gastroenteritis and colitis, unspecified: Secondary | ICD-10-CM

## 2021-07-10 DIAGNOSIS — K219 Gastro-esophageal reflux disease without esophagitis: Secondary | ICD-10-CM

## 2021-07-10 LAB — HEMOGLOBIN A1C: Hgb A1c MFr Bld: 5.4 % (ref 4.6–6.5)

## 2021-07-10 LAB — MAGNESIUM: Magnesium: 1.1 mg/dL — ABNORMAL LOW (ref 1.5–2.5)

## 2021-07-10 LAB — CBC WITH DIFFERENTIAL/PLATELET
Basophils Absolute: 0 10*3/uL (ref 0.0–0.1)
Basophils Relative: 0.5 % (ref 0.0–3.0)
Eosinophils Absolute: 0.2 10*3/uL (ref 0.0–0.7)
Eosinophils Relative: 2.7 % (ref 0.0–5.0)
HCT: 37.4 % (ref 36.0–46.0)
Hemoglobin: 12.4 g/dL (ref 12.0–15.0)
Lymphocytes Relative: 25.9 % (ref 12.0–46.0)
Lymphs Abs: 1.8 10*3/uL (ref 0.7–4.0)
MCHC: 33.1 g/dL (ref 30.0–36.0)
MCV: 96.6 fl (ref 78.0–100.0)
Monocytes Absolute: 0.8 10*3/uL (ref 0.1–1.0)
Monocytes Relative: 11.9 % (ref 3.0–12.0)
Neutro Abs: 4 10*3/uL (ref 1.4–7.7)
Neutrophils Relative %: 59 % (ref 43.0–77.0)
Platelets: 318 10*3/uL (ref 150.0–400.0)
RBC: 3.87 Mil/uL (ref 3.87–5.11)
RDW: 13.8 % (ref 11.5–15.5)
WBC: 6.8 10*3/uL (ref 4.0–10.5)

## 2021-07-10 LAB — COMPREHENSIVE METABOLIC PANEL
ALT: 11 U/L (ref 0–35)
AST: 32 U/L (ref 0–37)
Albumin: 3.6 g/dL (ref 3.5–5.2)
Alkaline Phosphatase: 105 U/L (ref 39–117)
BUN: 4 mg/dL — ABNORMAL LOW (ref 6–23)
CO2: 40 mEq/L — ABNORMAL HIGH (ref 19–32)
Calcium: 7.8 mg/dL — ABNORMAL LOW (ref 8.4–10.5)
Chloride: 94 mEq/L — ABNORMAL LOW (ref 96–112)
Creatinine, Ser: 0.71 mg/dL (ref 0.40–1.20)
GFR: 96.87 mL/min (ref >60.00)
Glucose, Bld: 106 mg/dL — ABNORMAL HIGH (ref 70–99)
Potassium: 3.1 mEq/L — ABNORMAL LOW (ref 3.5–5.1)
Sodium: 142 mEq/L (ref 135–145)
Total Bilirubin: 0.3 mg/dL (ref 0.2–1.2)
Total Protein: 6.2 g/dL (ref 6.0–8.3)

## 2021-07-10 MED ORDER — NICOTINE 7 MG/24HR TD PT24: 7.0000 mg | MEDICATED_PATCH | Freq: Every day | TRANSDERMAL | 0 refills | Status: DC

## 2021-07-10 MED ORDER — NICOTINE 21 MG/24HR TD PT24: 21.0000 mg | MEDICATED_PATCH | Freq: Every day | TRANSDERMAL | 0 refills | Status: DC

## 2021-07-10 MED ORDER — NICOTINE 14 MG/24HR TD PT24: 14.0000 mg | MEDICATED_PATCH | Freq: Every day | TRANSDERMAL | 0 refills | Status: DC

## 2021-07-10 NOTE — Progress Notes (Signed)
Established Patient Office Visit  Subjective:  Patient ID: Vicki [name], female    DOB: 1965-05-19  Age: 56 y.o. MRN: [id number]  CC:  Chief Complaint  Patient presents with   Follow-up    Patient states she is here to follow up on medications and discuss shingles vaccine. [place] having numbness in both hands and feet     HPI Vicki [name] presents for hand cramping and diarrhea  Hand cramping On and off x 1 week Hands, pain up arms No neck pain, headaches, visual changes Diarrhea, no other symptoms Has not happened before No dorsal wrist pain  Diarrhea 2 weeks No blood or mucus Feels and smells like c.diff Interested in culture and treatment if needed.     Would be due for shingles shot #2. Unfortunately did not tolerate first shot well - severe flu like symptoms.   Past Medical History:  Diagnosis Date   Allergy    seasonal allergies   Anxiety    on meds   Arthritis    hands   Asthma    childhood-current   CAP (community acquired pneumonia) 04/07/2019   Carpal tunnel syndrome    had bilateral sx to tx   Cataract    bilateral--no sx yet   COPD (chronic obstructive pulmonary disease) ([place])    Depression    on meds   Diverticulitis    Emphysema of lung ([place])    uses MDI   Essential hypertension, benign 07/14/2019   on meds   Hyperlipidemia    on meds   Oxygen deficiency    home O2 at 2.5L/[place] nightly   Oxygen dependent    uses O2 at 2.5L/[place] nightly    Past Surgical History:  Procedure Laterality Date   ABDOMINAL HYSTERECTOMY  2009   APPENDECTOMY     ARTHROSCOPIC REPAIR ACL Left 2011/2014   x 2   BIOPSY  01/21/2020   Procedure: BIOPSY;  Surgeon: Mauri Pole, MD;  Location: [facility];  Service: Endoscopy;;  EGD and COLON   BUNIONECTOMY  2006   both   CARPAL TUNNEL RELEASE Bilateral 10/28/2013   Procedure: BILATERAL CARPAL TUNNEL RELEASE;  Surgeon: Wynonia Sours, MD;  Location: [facility];  Service:  Orthopedics;  Laterality: Bilateral;   CHOLECYSTECTOMY  1994   CHONDROPLASTY Right 08/31/2020   Procedure: CHONDROPLASTY;  Surgeon: Earlie Server, MD;  Location: [place];  Service: Orthopedics;  Laterality: Right;   COLONOSCOPY WITH PROPOFOL N/A 01/21/2020   Procedure: COLONOSCOPY WITH PROPOFOL;  Surgeon: Mauri Pole, MD;  Location: [facility];  Service: Endoscopy;  Laterality: N/A;   DILATION AND CURETTAGE OF UTERUS     ESOPHAGOGASTRODUODENOSCOPY (EGD) WITH PROPOFOL N/A 01/21/2020   Procedure: ESOPHAGOGASTRODUODENOSCOPY (EGD) WITH PROPOFOL;  Surgeon: Mauri Pole, MD;  Location: [facility];  Service: Endoscopy;  Laterality: N/A;   KNEE ARTHROSCOPY WITH ANTERIOR CRUCIATE LIGAMENT (ACL) REPAIR Right 08/31/2020   Procedure: RIGHT KNEE ARTHROSCOPY WITH ANTERIOR CRUCIATE LIGAMENT (ACL) REPAIR MEDIAL AND LATERAL  MENISECTOMY;  Surgeon: Earlie Server, MD;  Location: [place];  Service: Orthopedics;  Laterality: Right;  FEMORAL NERVE BLOCK   POLYPECTOMY  01/21/2020   Procedure: POLYPECTOMY;  Surgeon: Mauri Pole, MD;  Location: [facility];  Service: Endoscopy;;   TUBAL LIGATION  2004    Family History  Problem Relation Age of Onset   Brain cancer Mother 77   Lung cancer Mother 68   Breast cancer Sister 66   Ovarian  cancer Sister 21   Colon cancer Neg Hx    Colon polyps Neg Hx    Esophageal cancer Neg Hx    Rectal cancer Neg Hx    Stomach cancer Neg Hx     Social History   Socioeconomic History   Marital status: Married    Spouse name: [name]   Number of children: 3   Years of education: 12th grade   Highest education level: Not on file  Occupational History   Occupation: Airline pilot: [employer]  Tobacco Use   Smoking status: Every Day    Packs/day: 1.00    Years: 34.00    Pack years: 34.00    Types: Cigarettes   Smokeless tobacco: Never  Vaping Use   Vaping Use: Never used  Substance and Sexual  Activity   Alcohol use: Yes    Comment: everyday   Drug use: No   Sexual activity: Yes    Birth control/protection: Surgical  Other Topics Concern   Not on file  Social History Narrative   Lives with her husband.   Their 3 children live independently.   Social Determinants of Health   Financial Resource Strain: Not on file  Food Insecurity: Not on file  Transportation Needs: Not on file  Physical Activity: Not on file  Stress: Not on file  Social Connections: Not on file  Intimate Partner Violence: Not on file    Outpatient Medications Prior to Visit  Medication Sig Dispense Refill   acetaminophen (TYLENOL) 500 MG tablet Take 1,000 mg by mouth every 6 (six) hours as needed for moderate pain.     albuterol (VENTOLIN HFA) 108 (90 Base) MCG/ACT inhaler Inhale 2 puffs into the lungs every 6 (six) hours as needed for wheezing or shortness of breath. 18 g 0   ALPRAZolam (XANAX) 1 MG tablet Take 1 tablet (1 mg total) by mouth 3 (three) times daily as needed for anxiety. 90 tablet 0   amLODipine (NORVASC) 5 MG tablet Take 1 tablet (5 mg total) by mouth daily. 90 tablet 1   ASPIRIN LOW DOSE 81 MG EC tablet TAKE 1 TABLET BY MOUTH 2 TIMES DAILY. (Patient taking differently: Take 81 mg by mouth in the morning and at bedtime.) 180 tablet 1   azelastine (ASTELIN) 0.1 % nasal spray Place 1 spray into both nostrils 2 (two) times daily. Use in each nostril as directed 30 mL 12   cetirizine (ZYRTEC) 10 MG tablet Take 1 tablet (10 mg total) by mouth daily. 30 tablet 11   citalopram (CELEXA) 20 MG tablet Take 3 tablets (60 mg total) by mouth daily. 270 tablet 2   dapsone 25 MG tablet TAKE 1 TABLET BY MOUTH DAILY FOR 14 DAYS, THEN 2 TABLETS DAILY. 180 tablet 1   EPINEPHrine (EPIPEN 2-PAK) 0.3 mg/0.3 mL IJ SOAJ injection Inject 0.3 mg into the muscle as needed for anaphylaxis. 1 each 3   famotidine (PEPCID) 20 MG tablet TAKE 1 TABLET BY MOUTH TWICE A DAY (MORNING AND NIGHT) (Patient taking differently:  Take 20 mg by mouth 2 (two) times daily at 8 am and 10 pm.) 60 tablet 1   fexofenadine (ALLEGRA) 180 MG tablet Take 1 tablet (180 mg total) by mouth daily.     fluticasone-salmeterol (ADVAIR) 500-50 MCG/ACT AEPB Inhale 1 puff into the lungs in the morning and at bedtime. 60 each 0   gabapentin (NEURONTIN) 300 MG capsule Take 1 capsule (300 mg total) by mouth 2 (two) times  daily. 180 capsule 3   hydrOXYzine (ATARAX) 25 MG tablet Take 0.5-1 tablets (12.5-25 mg total) by mouth 3 (three) times daily as needed for anxiety. 90 tablet 4   ipratropium-albuterol (DUONEB) 0.5-2.5 (3) MG/3ML SOLN Use 1 vial by nebulization in the morning and at bedtime. 360 mL 0   meclizine (ANTIVERT) 25 MG tablet Take 1 tablet (25 mg total) by mouth 3 (three) times daily as needed for dizziness. 180 tablet 1   Menthol, Topical Analgesic, (BIOFREEZE EX) Apply 1 application topically daily as needed (back pain).     montelukast (SINGULAIR) 10 MG tablet Take 1 tablet (10 mg total) by mouth at bedtime. 90 tablet 3   omeprazole (PRILOSEC) 20 MG capsule Take 1 capsule (20 mg total) by mouth 2 (two) times daily. Take 1 capsule (20 mg total) by mouth 2 (two) times daily. 180 capsule 1   ondansetron (ZOFRAN) 8 MG tablet Take 1 tablet (8 mg total) by mouth every 8 (eight) hours as needed for nausea or vomiting. 60 tablet 5   pantoprazole (PROTONIX) 40 MG tablet TAKE 1 TABLET BY MOUTH EVERY DAY 90 tablet 3   prazosin (MINIPRESS) 1 MG capsule TAKE 1 TO 2 CAPSULES (1 TO 2 MG TOTAL) BY MOUTH AT BEDTIME. (Patient taking differently: Take 1-2 mg by mouth at bedtime.) 180 capsule 1   predniSONE (DELTASONE) 5 MG tablet Take 8 tablets daily for 3 days, 6 tabs daily for 3 days, 4 tabs daily for 3 days, 2 tabs daily  for 3 days, 1 tab daily for 3 days, 1/2 tab daily for 3 days then STOP 65 tablet 0   promethazine (PHENERGAN) 25 MG tablet Take 1 tablet (25 mg total) by mouth every 8 (eight) hours as needed for nausea or vomiting. 180 tablet 1    rizatriptan (MAXALT) 10 MG tablet TAKE 1 TABLET BY MOUTH AS NEEDED FOR MIGRAINE. MAY REPEAT IN 2 HOURS IF NEEDED 10 tablet 0   rOPINIRole (REQUIP) 2 MG tablet Take 0.5-1 tablets (1-2 mg total) by mouth at bedtime. 180 tablet 1   STIOLTO RESPIMAT 2.5-2.5 MCG/ACT AERS Inhale 1 puff into the lungs daily. 12 g 12   traZODone (DESYREL) 50 MG tablet Take 1 tablet (50 mg total) by mouth at bedtime. 90 tablet 3   No facility-administered medications prior to visit.    Allergies  Allergen Reactions   Jasmine Oil Shortness Of Breath and Swelling   Levaquin [Levofloxacin] Shortness Of Breath, Itching, Swelling and Rash   Penicillins Anaphylaxis and Hives    Tolerated ceftriaxone   Aleve [Naproxen Sodium] Itching   Ceftin [Cefuroxime Axetil] Other (See Comments)    unsure   Chlorhexidine    Depo-Provera [Medroxyprogesterone Acetate] Other (See Comments)    Severe acne   Doxycycline Other (See Comments)    Raises blood pressure    Oxycodone Nausea And Vomiting   Tramadol Other (See Comments)    Kept her up all night    ROS Review of Systems  Constitutional: Negative.   HENT: Negative.    Eyes: Negative.   Respiratory: Negative.    Cardiovascular: Negative.   Gastrointestinal: Negative.   Genitourinary: Negative.   Musculoskeletal: Negative.   Skin: Negative.   Neurological: Negative.   Psychiatric/Behavioral: Negative.    All other systems reviewed and are negative.    Objective:    Physical Exam Vitals and nursing note reviewed.  Constitutional:      General: She is not in acute distress.    Appearance: Normal  appearance. She is normal weight. She is not ill-appearing, toxic-appearing or diaphoretic.  Cardiovascular:     Rate and Rhythm: Normal rate and regular rhythm.     Heart sounds: Normal heart sounds. No murmur heard.   No friction rub. No gallop.  Pulmonary:     Effort: Pulmonary effort is normal. No respiratory distress.     Breath sounds: Normal breath sounds. No  stridor. No wheezing, rhonchi or rales.  Chest:     Chest wall: No tenderness.  Skin:    General: Skin is warm and dry.  Neurological:     General: No focal deficit present.     Mental Status: She is alert and oriented to person, place, and time. Mental status is at baseline.  Psychiatric:        Mood and Affect: Mood normal.        Behavior: Behavior normal.        Thought Content: Thought content normal.        Judgment: Judgment normal.    BP 133/88    Pulse 90    Temp 97.8 F (36.6 C) (Temporal)    Resp 16    Ht '5\' 3"'  (1.6 m)    Wt 166 lb 6.4 oz (75.5 kg)    SpO2 97%    BMI 29.48 kg/m  Wt Readings from Last 3 Encounters:  07/10/21 166 lb 6.4 oz (75.5 kg)  05/09/21 166 lb (75.3 kg)  04/27/21 168 lb 3.4 oz (76.3 kg)     Health Maintenance Due  Topic Date Due   COVID-19 Vaccine (4 - Booster for [place] series) 05/17/2020   MAMMOGRAM  04/22/2021    There are no preventive care reminders to display for this patient.  Lab Results  Component Value Date   TSH 0.698 04/26/2021   Lab Results  Component Value Date   WBC 16.1 (H) 05/09/2021   HGB 12.9 05/09/2021   HCT 39.0 05/09/2021   MCV 94.9 05/09/2021   PLT 365.0 05/09/2021   Lab Results  Component Value Date   NA 136 05/09/2021   K 4.2 05/09/2021   CO2 29 05/09/2021   GLUCOSE 87 05/09/2021   BUN 16 05/09/2021   CREATININE 0.70 05/09/2021   BILITOT 0.7 05/09/2021   ALKPHOS 91 05/09/2021   AST 16 05/09/2021   ALT 20 05/09/2021   PROT 6.3 05/09/2021   ALBUMIN 3.7 05/09/2021   CALCIUM 9.1 05/09/2021   ANIONGAP 10 05/01/2021   EGFR 81 10/07/2020   GFR 98.65 05/09/2021   Lab Results  Component Value Date   CHOL 207 (H) 07/14/2019   Lab Results  Component Value Date   HDL 66 07/14/2019   Lab Results  Component Value Date   LDLCALC 96 07/14/2019   Lab Results  Component Value Date   TRIG 274 (H) 07/14/2019   Lab Results  Component Value Date   CHOLHDL 3.1 07/14/2019   Lab Results  Component  Value Date   HGBA1C 5.4 08/18/2020      Assessment & Plan:   Problem List Items Addressed This Visit   None Visit Diagnoses     Infectious diarrhea    -  Primary   Relevant Orders   Stool culture   CBC with Differential/Platelet   Comprehensive metabolic panel   Hemoglobin A1c   TSH   Magnesium   Encounter for smoking cessation counseling       Relevant Medications   nicotine (NICODERM CQ - DOSED IN MG/24 HOURS) 21  mg/24hr patch   nicotine (NICODERM CQ - DOSED IN MG/24 HOURS) 14 mg/24hr patch   nicotine (NICODERM CQ - DOSED IN MG/24 HR) 7 mg/24hr patch   Leg cramping       Relevant Orders   CBC with Differential/Platelet   Comprehensive metabolic panel   Hemoglobin A1c   TSH   Magnesium   Acute diarrhea       Relevant Orders   CBC with Differential/Platelet   Comprehensive metabolic panel   Hemoglobin A1c   TSH   Magnesium       Meds ordered this encounter  Medications   nicotine (NICODERM CQ - DOSED IN MG/24 HOURS) 21 mg/24hr patch    Sig: Place 1 patch (21 mg total) onto the skin daily.    Dispense:  28 patch    Refill:  0    Order Specific Question:   Supervising Provider    Answer:   Carlota Raspberry, JEFFREY R [[id number]]   nicotine (NICODERM CQ - DOSED IN MG/24 HOURS) 14 mg/24hr patch    Sig: Place 1 patch (14 mg total) onto the skin daily.    Dispense:  28 patch    Refill:  0    Order Specific Question:   Supervising Provider    Answer:   Carlota Raspberry, JEFFREY R [[id number]]   nicotine (NICODERM CQ - DOSED IN MG/24 HR) 7 mg/24hr patch    Sig: Place 1 patch (7 mg total) onto the skin daily.    Dispense:  28 patch    Refill:  0    Order Specific Question:   Supervising Provider    Answer:   Carlota Raspberry, JEFFREY R [[id number]]    Follow-up: Return in about 3 months (around 10/07/2021).   PLAN Labs as above, including stool culture. Treat as indicated Suspect hand cramping related to dehydration / electrolyte imbalance.  Suggest aggressive hydration, bland foods Patient encouraged  to call clinic with any questions, comments, or concerns.  Maximiano Coss, NP

## 2021-07-10 NOTE — Patient Instructions (Addendum)
Vicki [name] to see you  I will send azithromycin. Should help with any copd, and usually with infectious diarrhea.  I will let you know how labs, including stool culture, look  Take magnesium supplement before bed nightly.  Call if you need anything  Thanks,  [name]     If you have lab work done today you will be contacted with your lab results within the next 2 weeks.  If you have not heard from [place] then please contact us. The fastest way to get your results is to register for My Chart.   IF you received an x-ray today, you will receive an invoice from [facility]. Please contact [facility] at [phone] with questions or concerns regarding your invoice.   IF you received labwork today, you will receive an invoice from [facility]. Please contact [facility] at [phone] with questions or concerns regarding your invoice.   Our billing staff will not be able to assist you with questions regarding bills from these companies.  You will be contacted with the lab results as soon as they are available. The fastest way to get your results is to activate your My Chart account. Instructions are located on the last page of this paperwork. If you have not heard from [place] regarding the results in 2 weeks, please contact this office.

## 2021-07-11 LAB — TSH: TSH: 3.17 u[IU]/mL (ref 0.35–5.50)

## 2021-07-12 ENCOUNTER — Other Ambulatory Visit: Payer: Self-pay | Admitting: Registered Nurse

## 2021-07-12 NOTE — Progress Notes (Signed)
Pt with hypocalcemia, hypomagnesemia on labs. ?No tetany. Likely related to recent diarrhea.  ? ?Will repeat labs and obtain PTH ? ?Pt  notified via MyChart ? ?Kathrin Ruddy, NP ?

## 2021-07-14 LAB — STOOL CULTURE: E coli, Shiga toxin Assay: NEGATIVE

## 2021-08-03 ENCOUNTER — Other Ambulatory Visit: Payer: Self-pay | Admitting: Registered Nurse

## 2021-08-03 DIAGNOSIS — L270 Generalized skin eruption due to drugs and medicaments taken internally: Secondary | ICD-10-CM

## 2021-08-23 ENCOUNTER — Other Ambulatory Visit: Payer: Self-pay | Admitting: Registered Nurse

## 2021-08-23 DIAGNOSIS — R112 Nausea with vomiting, unspecified: Secondary | ICD-10-CM

## 2021-08-23 DIAGNOSIS — F32A Depression, unspecified: Secondary | ICD-10-CM

## 2021-08-23 DIAGNOSIS — Z716 Tobacco abuse counseling: Secondary | ICD-10-CM

## 2021-09-17 ENCOUNTER — Other Ambulatory Visit: Payer: Self-pay | Admitting: Registered Nurse

## 2021-09-17 DIAGNOSIS — L270 Generalized skin eruption due to drugs and medicaments taken internally: Secondary | ICD-10-CM

## 2021-09-30 ENCOUNTER — Other Ambulatory Visit: Payer: Self-pay | Admitting: Registered Nurse

## 2021-09-30 DIAGNOSIS — F32A Depression, unspecified: Secondary | ICD-10-CM

## 2021-10-02 NOTE — Telephone Encounter (Signed)
Patient is requesting a refill of the following medications: Requested Prescriptions   Pending Prescriptions Disp Refills   ALPRAZolam (XANAX) 1 MG tablet [Pharmacy Med Name: ALPRAZOLAM 1 MG TABLET] 90 tablet 0    Sig: TAKE 1 TABLET BY MOUTH THREE TIMES A DAY AS NEEDED FOR ANXIETY    Date of patient request: 09/30/2021 Last office visit: 07/10/2021 Date of last refill: 08/24/2021 Last refill amount: 90 tablets Follow up time period per chart: 10/10/2021

## 2021-10-10 ENCOUNTER — Other Ambulatory Visit: Payer: Self-pay

## 2021-10-10 ENCOUNTER — Telehealth (INDEPENDENT_AMBULATORY_CARE_PROVIDER_SITE_OTHER): Payer: 59 | Admitting: Registered Nurse

## 2021-10-10 DIAGNOSIS — E876 Hypokalemia: Secondary | ICD-10-CM

## 2021-10-10 DIAGNOSIS — G2581 Restless legs syndrome: Secondary | ICD-10-CM | POA: Diagnosis not present

## 2021-10-10 DIAGNOSIS — J441 Chronic obstructive pulmonary disease with (acute) exacerbation: Secondary | ICD-10-CM

## 2021-10-10 MED ORDER — MAGNESIUM CITRATE 100 MG PO TABS: 1.0000 | ORAL_TABLET | Freq: Every evening | ORAL | 1 refills | Status: DC

## 2021-10-10 MED ORDER — STIOLTO RESPIMAT 2.5-2.5 MCG/ACT IN AERS: 1.0000 | INHALATION_SPRAY | Freq: Every day | RESPIRATORY_TRACT | 12 refills | Status: DC

## 2021-10-10 MED ORDER — IPRATROPIUM-ALBUTEROL 0.5-2.5 (3) MG/3ML IN SOLN: 3.0000 mL | Freq: Two times a day (BID) | RESPIRATORY_TRACT | 0 refills | Status: DC

## 2021-10-10 MED ORDER — POTASSIUM CHLORIDE CRYS ER 20 MEQ PO TBCR: 20.0000 meq | EXTENDED_RELEASE_TABLET | Freq: Every day | ORAL | 3 refills | Status: AC

## 2021-10-10 MED ORDER — ROPINIROLE HCL 2 MG PO TABS: 1.0000 mg | ORAL_TABLET | Freq: Every day | ORAL | 1 refills | Status: DC

## 2021-10-10 NOTE — Progress Notes (Signed)
Telemedicine Encounter- SOAP NOTE Established Patient  This telephone encounter was conducted with the patient's (or proxy's) verbal consent via audio telecommunications: yes/no: Yes Patient was instructed to have this encounter in a suitably private space; and to only have persons present to whom they give permission to participate. In addition, patient identity was confirmed by use of name plus two identifiers (DOB and address).  I discussed the limitations, risks, security and privacy concerns of performing an evaluation and management service by telephone and the availability of in person appointments. I also discussed with the patient that there may be a patient responsible charge related to this service. The patient expressed understanding and agreed to proceed.  I spent a total of 15 minutes talking with the patient or their proxy.  Patient at home Provider in office  Participants: Kathrin Ruddy, NP and Angelina Pih  Chief Complaint  Patient presents with   Medication Management    Patient is just following up on medication.    Subjective   Vicki [name] is a 56 y.o. established patient. Telephone visit today for med management  HPI Hand Cramping Worsened Has started in on her face, hands, feet.  No clear pattern. Intermittent events.  Notes her diarrhea has persisted, even worsened in past few months Last labs showed hypokalemia to 3.1, she has been borderline before and low to this level within past few years. She does not have any CV symptoms at this time.  She was hypomagnesemic as well - had not started magnesium supplement as advised   Med refills Requip for RLS - no AE, good effect. Hopes to continue.  Stiolto respimat - good effect, better than advair. Hopes to continue.   Patient Active Problem List   Diagnosis Date Noted   Current moderate episode of major depressive disorder, unspecified whether recurrent ([place]) 05/09/2021   Lower abdominal  pain    Nausea vomiting and diarrhea    COPD exacerbation ([place]) 04/25/2021   Acute on chronic respiratory failure with hypoxia ([place]) 04/25/2021   Essential hypertension 04/25/2021   AKI (acute kidney injury) ([place]) 04/25/2021   Anxiety    Aortic atherosclerosis ([place]) 10/28/2020   C. difficile colitis 10/19/2020   Diverticulitis 10/18/2020   Chronic urticaria 10/07/2020   Multiple drug allergies 10/07/2020   Anterior cruciate ligament complete tear 08/31/2020   Preop pulmonary/respiratory exam 07/28/2020   Polyp of transverse colon    Colon cancer screening    Generalized abdominal pain    Nausea and vomiting    Mucosal abnormality of rectum    Hyperlipidemia 07/14/2019   RLS (restless legs syndrome) 07/14/2019   Tobacco use disorder 07/14/2019   Essential hypertension, benign 07/14/2019   Generalized anxiety disorder 07/14/2019   UTI (urinary tract infection) 04/07/2019   SIRS (systemic inflammatory response syndrome) ([place]) 04/07/2019   Viral gastroenteritis 04/07/2019   Fatigue 11/07/2018   Depression 11/07/2018   Insomnia 11/07/2018   Polypharmacy 07/19/2017   Asthma    Carpal tunnel syndrome    COPD (chronic obstructive pulmonary disease) ([place])    DDD (degenerative disc disease), lumbar 10/22/2016    Past Medical History:  Diagnosis Date   Allergy    seasonal allergies   Anxiety    on meds   Arthritis    hands   Asthma    childhood-current   CAP (community acquired pneumonia) 04/07/2019   Carpal tunnel syndrome    had bilateral sx to tx   Cataract    bilateral--no sx  yet   COPD (chronic obstructive pulmonary disease) ([facility])    Depression    on meds   Diverticulitis    Emphysema of lung ([place])    uses MDI   Essential hypertension, benign 07/14/2019   on meds   Hyperlipidemia    on meds   Oxygen deficiency    home O2 at 2.5L/[place] nightly   Oxygen dependent    uses O2 at 2.5L/[place] nightly    Current Outpatient Medications  Medication Sig Dispense Refill    acetaminophen (TYLENOL) 500 MG tablet Take 1,000 mg by mouth every 6 (six) hours as needed for moderate pain.     albuterol (VENTOLIN HFA) 108 (90 Base) MCG/ACT inhaler Inhale 2 puffs into the lungs every 6 (six) hours as needed for wheezing or shortness of breath. 18 g 0   ALPRAZolam (XANAX) 1 MG tablet TAKE 1 TABLET BY MOUTH THREE TIMES A DAY AS NEEDED FOR ANXIETY 90 tablet 0   amLODipine (NORVASC) 5 MG tablet Take 1 tablet (5 mg total) by mouth daily. 90 tablet 1   ASPIRIN LOW DOSE 81 MG EC tablet TAKE 1 TABLET BY MOUTH 2 TIMES DAILY. (Patient taking differently: Take 81 mg by mouth in the morning and at bedtime.) 180 tablet 1   azelastine (ASTELIN) 0.1 % nasal spray Place 1 spray into both nostrils 2 (two) times daily. Use in each nostril as directed 30 mL 12   cetirizine (ZYRTEC) 10 MG tablet Take 1 tablet (10 mg total) by mouth daily. 30 tablet 11   citalopram (CELEXA) 20 MG tablet TAKE 3 TABLETS BY MOUTH EVERY DAY 270 tablet 2   dapsone 25 MG tablet TAKE 1 TABLET BY MOUTH DAILY FOR 14 DAYS, THEN 2 TABLETS DAILY. 180 tablet 1   EPINEPHrine (EPIPEN 2-PAK) 0.3 mg/0.3 mL IJ SOAJ injection Inject 0.3 mg into the muscle as needed for anaphylaxis. 1 each 3   famotidine (PEPCID) 20 MG tablet TAKE 1 TABLET BY MOUTH TWICE A DAY (MORNING AND NIGHT) (Patient taking differently: Take 20 mg by mouth 2 (two) times daily at 8 am and 10 pm.) 60 tablet 1   fexofenadine (ALLEGRA) 180 MG tablet Take 1 tablet (180 mg total) by mouth daily.     gabapentin (NEURONTIN) 300 MG capsule Take 1 capsule (300 mg total) by mouth 2 (two) times daily. 180 capsule 3   hydrOXYzine (ATARAX) 25 MG tablet TAKE 0.5-1 TABLETS (12.5-25 MG TOTAL) BY MOUTH 3 (THREE) TIMES DAILY AS NEEDED FOR ANXIETY. 270 tablet 2   Magnesium Citrate 100 MG TABS Take 1 tablet by mouth at bedtime. 90 tablet 1   meclizine (ANTIVERT) 25 MG tablet Take 1 tablet (25 mg total) by mouth 3 (three) times daily as needed for dizziness. 180 tablet 1   Menthol,  Topical Analgesic, (BIOFREEZE EX) Apply 1 application topically daily as needed (back pain).     montelukast (SINGULAIR) 10 MG tablet Take 1 tablet (10 mg total) by mouth at bedtime. 90 tablet 3   nicotine (NICODERM CQ - DOSED IN MG/24 HOURS) 14 mg/24hr patch Place 1 patch (14 mg total) onto the skin daily. 28 patch 0   nicotine (NICODERM CQ - DOSED IN MG/24 HOURS) 21 mg/24hr patch PLACE 1 PATCH ONTO THE SKIN DAILY. 28 patch 0   nicotine (NICODERM CQ - DOSED IN MG/24 HR) 7 mg/24hr patch Place 1 patch (7 mg total) onto the skin daily. 28 patch 0   omeprazole (PRILOSEC) 20 MG capsule TAKE 1 CAPSULE BY MOUTH  TWICE A DAY 180 capsule 1   ondansetron (ZOFRAN) 8 MG tablet Take 1 tablet (8 mg total) by mouth every 8 (eight) hours as needed for nausea or vomiting. 60 tablet 5   pantoprazole (PROTONIX) 40 MG tablet TAKE 1 TABLET BY MOUTH EVERY DAY 90 tablet 3   potassium chloride SA (KLOR-CON M) 20 MEQ tablet Take 1 tablet (20 mEq total) by mouth daily. 90 tablet 3   prazosin (MINIPRESS) 1 MG capsule TAKE 1 TO 2 CAPSULES (1 TO 2 MG TOTAL) BY MOUTH AT BEDTIME. (Patient taking differently: Take 1-2 mg by mouth at bedtime.) 180 capsule 1   predniSONE (DELTASONE) 5 MG tablet Take 8 tablets daily for 3 days, 6 tabs daily for 3 days, 4 tabs daily for 3 days, 2 tabs daily  for 3 days, 1 tab daily for 3 days, 1/2 tab daily for 3 days then STOP 65 tablet 0   promethazine (PHENERGAN) 25 MG tablet TAKE 1 TABLET BY MOUTH EVERY 8 HOURS AS NEEDED FOR NAUSEA OR VOMITING. 180 tablet 1   rizatriptan (MAXALT) 10 MG tablet TAKE 1 TABLET BY MOUTH AS NEEDED FOR MIGRAINE. MAY REPEAT IN 2 HOURS IF NEEDED 10 tablet 0   traZODone (DESYREL) 50 MG tablet Take 1 tablet (50 mg total) by mouth at bedtime. 90 tablet 3   ipratropium-albuterol (DUONEB) 0.5-2.5 (3) MG/3ML SOLN Use 1 vial by nebulization in the morning and at bedtime. 360 mL 0   rOPINIRole (REQUIP) 2 MG tablet Take 0.5-1 tablets (1-2 mg total) by mouth at bedtime. 180 tablet 1    STIOLTO RESPIMAT 2.5-2.5 MCG/ACT AERS Inhale 1 puff into the lungs daily. 12 g 12   No current facility-administered medications for this visit.    Allergies  Allergen Reactions   Jasmine Oil Shortness Of Breath and Swelling   Levaquin [Levofloxacin] Shortness Of Breath, Itching, Swelling and Rash   Penicillins Anaphylaxis and Hives    Tolerated ceftriaxone   Aleve [Naproxen Sodium] Itching   Ceftin [Cefuroxime Axetil] Other (See Comments)    unsure   Chlorhexidine    Depo-Provera [Medroxyprogesterone Acetate] Other (See Comments)    Severe acne   Doxycycline Other (See Comments)    Raises blood pressure    Oxycodone Nausea And Vomiting   Tramadol Other (See Comments)    Kept her up all night    Social History   Socioeconomic History   Marital status: Married    Spouse name: [name]   Number of children: 3   Years of education: 12th grade   Highest education level: Not on file  Occupational History   Occupation: Airline pilot: [employer]  Tobacco Use   Smoking status: Every Day    Packs/day: 1.00    Years: 34.00    Pack years: 34.00    Types: Cigarettes   Smokeless tobacco: Never  Vaping Use   Vaping Use: Never used  Substance and Sexual Activity   Alcohol use: Yes    Comment: everyday   Drug use: No   Sexual activity: Yes    Birth control/protection: Surgical  Other Topics Concern   Not on file  Social History Narrative   Lives with her husband.   Their 3 children live independently.   Social Determinants of Health   Financial Resource Strain: Not on file  Food Insecurity: Not on file  Transportation Needs: Not on file  Physical Activity: Not on file  Stress: Not on file  Social Connections: Not on  file  Intimate Partner Violence: Not on file    ROS Per hpi   Objective   Vitals as reported by the patient: There were no vitals filed for this visit.  Tikesha was seen today for medication management.  Diagnoses and all  orders for this visit:  Chronic obstructive pulmonary disease with acute exacerbation ([place]) -     ipratropium-albuterol (DUONEB) 0.5-2.5 (3) MG/3ML SOLN; Use 1 vial by nebulization in the morning and at bedtime. -     STIOLTO RESPIMAT 2.5-2.5 MCG/ACT AERS; Inhale 1 puff into the lungs daily.  RLS (restless legs syndrome) -     rOPINIRole (REQUIP) 2 MG tablet; Take 0.5-1 tablets (1-2 mg total) by mouth at bedtime.  Hypomagnesemia -     potassium chloride SA (KLOR-CON M) 20 MEQ tablet; Take 1 tablet (20 mEq total) by mouth daily. -     Magnesium Citrate 100 MG TABS; Take 1 tablet by mouth at bedtime.  Hypokalemia -     potassium chloride SA (KLOR-CON M) 20 MEQ tablet; Take 1 tablet (20 mEq total) by mouth daily. -     Magnesium Citrate 100 MG TABS; Take 1 tablet by mouth at bedtime.    PLAN Aim to repeat labs within 1-2 weeks Advise her to follow up with her GI Dr. Silverio Decamp at [facility] her on kclor as above, magnesium citrate supplement for deficiencies Closely monitor and follow up as indicated Patient encouraged to call clinic with any questions, comments, or concerns.   I discussed the assessment and treatment plan with the patient. The patient was provided an opportunity to ask questions and all were answered. The patient agreed with the plan and demonstrated an understanding of the instructions.   The patient was advised to call back or seek an in-person evaluation if the symptoms worsen or if the condition fails to improve as anticipated.  I provided 15 minutes of non-face-to-face time during this encounter.  Maximiano Coss, NP

## 2021-10-10 NOTE — Patient Instructions (Signed)
° ° ° °  If you have lab work done today you will be contacted with your lab results within the next 2 weeks.  If you have not heard from us then please contact us. The fastest way to get your results is to register for My Chart. ° ° °IF you received an x-ray today, you will receive an invoice from [facility]. Please contact [facility] at [phone] with questions or concerns regarding your invoice.  ° °IF you received labwork today, you will receive an invoice from [facility]. Please contact [facility] at [phone] with questions or concerns regarding your invoice.  ° °Our billing staff will not be able to assist you with questions regarding bills from these companies. ° °You will be contacted with the lab results as soon as they are available. The fastest way to get your results is to activate your My Chart account. Instructions are located on the last page of this paperwork. If you have not heard from us regarding the results in 2 weeks, please contact this office. °  ° ° ° °

## 2021-10-16 ENCOUNTER — Telehealth: Payer: Self-pay | Admitting: Registered Nurse

## 2021-10-16 NOTE — Telephone Encounter (Signed)
I have LM making pt aware that her scripts [facility] [address]    Reason for Call Medication Question / Request Initial Comment Caller states is calling regarding prescriptions that were pending to be called in to pharmacy. Caller states medication should have been called in on Tuesday. Caller declined symptoms. Additional Comment Caller declied triage. Translation No Nurse Assessment Nurse: Files, RN, Dustin Date/Time (Eastern Time): 10/13/2021 6:13:54 PM Confirm and document reason for call. If symptomatic, describe symptoms. ---Caller states her MD was supposed to call in a prescription for her, its her everyday Stiolto and Potassium. Will have enough for Saturday and Sunday. No new or worsening symptoms. Does the patient have any new or worsening symptoms? ---No Please document clinical information provided and list any resource used. ---Instructed caller to call back on Monday to get medication sent in. Disp. Time Eilene Ghazi Time) Disposition Final User 10/13/2021 6:02:43 PM Send To Nurse Ferne Coe, RN, Maudry Mayhew 10/13/2021 6:21:52 PM Clinical Call Yes Files, RN, Rachel Bo

## 2021-10-17 ENCOUNTER — Encounter: Payer: Self-pay | Admitting: Registered Nurse

## 2021-10-17 ENCOUNTER — Other Ambulatory Visit: Payer: Self-pay | Admitting: Registered Nurse

## 2021-10-17 DIAGNOSIS — G43909 Migraine, unspecified, not intractable, without status migrainosus: Secondary | ICD-10-CM

## 2021-10-17 DIAGNOSIS — J441 Chronic obstructive pulmonary disease with (acute) exacerbation: Secondary | ICD-10-CM

## 2021-10-17 DIAGNOSIS — J449 Chronic obstructive pulmonary disease, unspecified: Secondary | ICD-10-CM

## 2021-10-17 MED ORDER — STIOLTO RESPIMAT 2.5-2.5 MCG/ACT IN AERS: 1.0000 | INHALATION_SPRAY | Freq: Every day | RESPIRATORY_TRACT | 12 refills | Status: DC

## 2021-10-17 NOTE — Telephone Encounter (Signed)
This has been sent  Thanks,  Denice Paradise

## 2021-11-02 ENCOUNTER — Other Ambulatory Visit: Payer: Self-pay | Admitting: Registered Nurse

## 2021-11-02 DIAGNOSIS — F32A Depression, unspecified: Secondary | ICD-10-CM

## 2021-11-02 MED ORDER — ALPRAZOLAM 1 MG PO TABS: ORAL_TABLET | ORAL | 0 refills | Status: DC

## 2021-11-02 NOTE — Telephone Encounter (Signed)
Does not appear that albuterol needs refill but alprazolam Patient is requesting a refill of the following medications: Requested Prescriptions   Pending Prescriptions Disp Refills   ALPRAZolam (XANAX) 1 MG tablet 90 tablet 0    Sig: TAKE 1 TABLET BY MOUTH THREE TIMES A DAY AS NEEDED FOR ANXIETY    Date of patient request: 11/02/21 Last office visit: 07/10/21 Date of last refill: 10/03/21 Last refill amount: 90

## 2021-11-02 NOTE — Telephone Encounter (Signed)
Refill request received in PCPs absence.  Chart reviewed, appears this medication was discussed in June 2022, controlled substance database reviewed.  Last prescription filled 10/03/2021 for #90.  Previous refills April 13, February 27, April 10, 2021, September 2022, June 2022.  Refill ordered, but will need office visit with PCP to discuss medication in the next 1 month if possible.

## 2021-11-06 ENCOUNTER — Ambulatory Visit: Payer: 59 | Admitting: Physician Assistant

## 2021-11-17 IMAGING — DX DG CHEST 2V
2 series · 2 of 2 positions shown · non-contrast
Comparison: Chest radiograph 04/07/2019 and CTA 04/08/2019

CLINICAL DATA: Dyspnea on exertion. History of smoking.

EXAM:
CHEST - 2 VIEW

[chest pa]
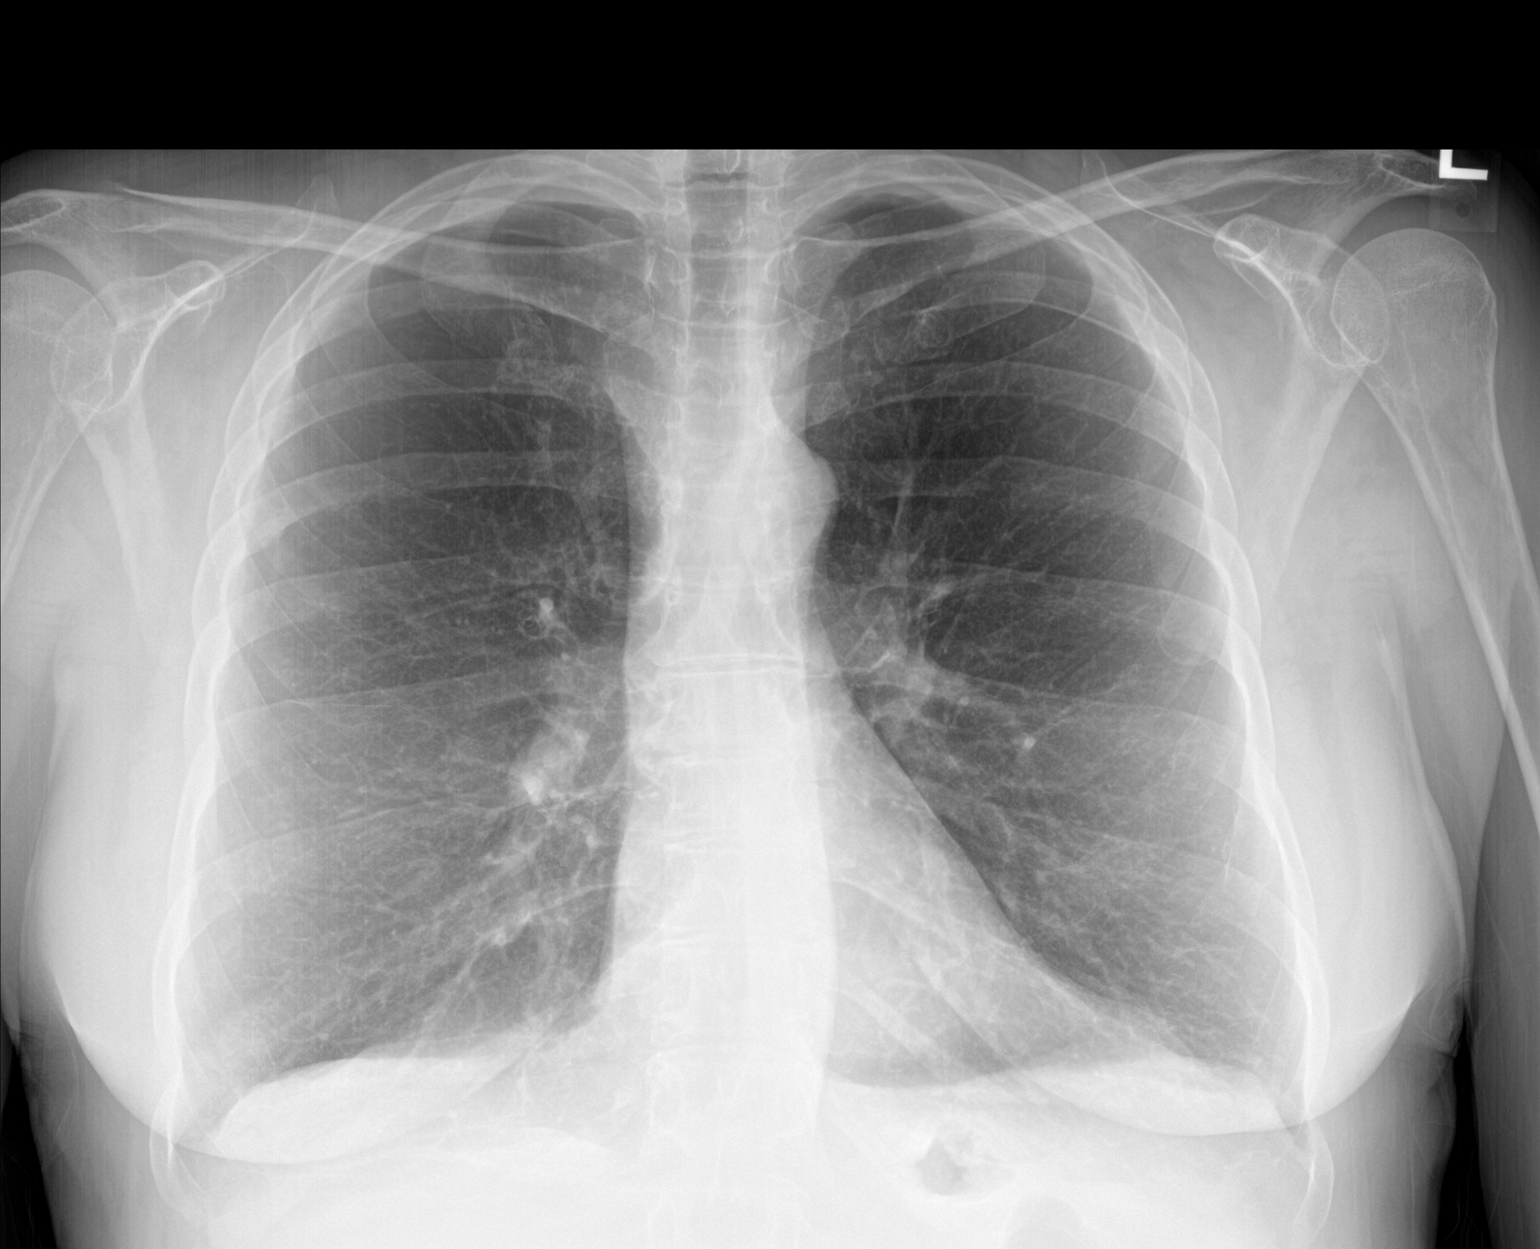

[chest lat]
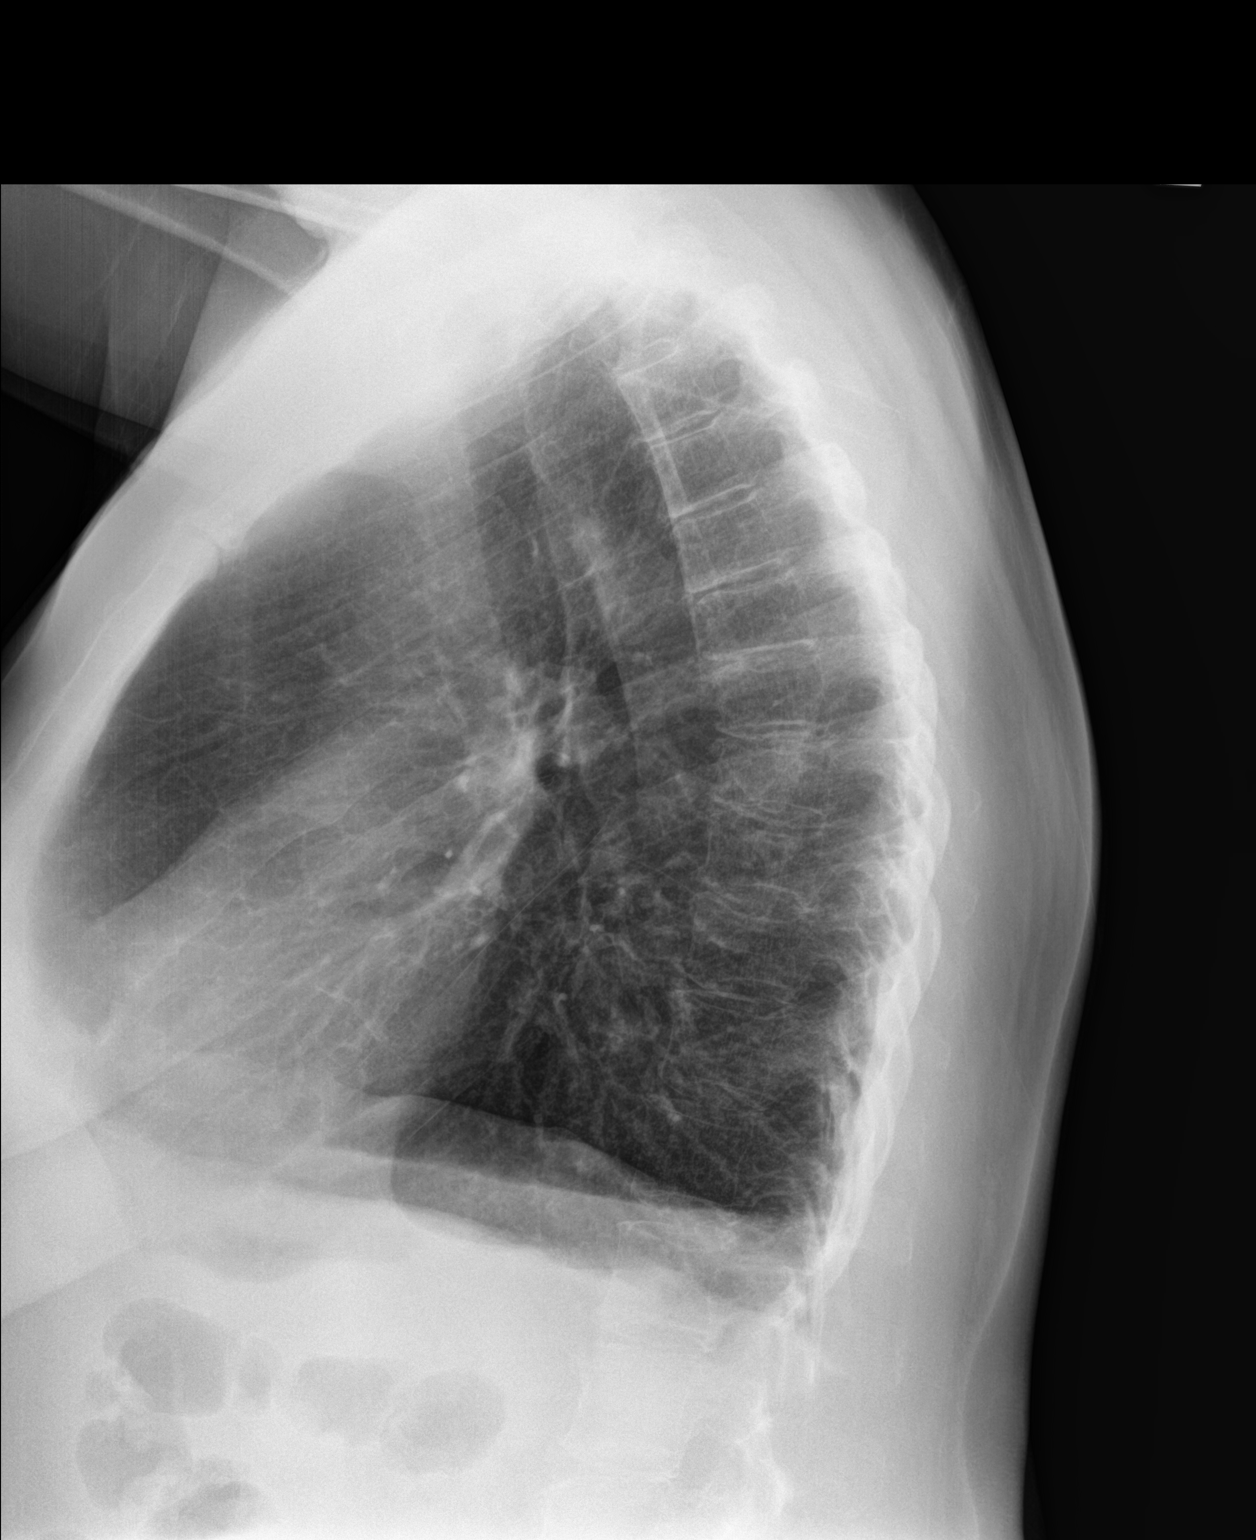

[2 of 2 positions shown; findings below may reference images not displayed]

FINDINGS: The cardiomediastinal silhouette is unchanged with normal heart
size. The lungs remain hyperinflated. No airspace consolidation,
edema, pleural effusion, pneumothorax is identified. No acute
osseous abnormality is seen.
IMPRESSION: COPD without evidence of active cardiopulmonary disease.

## 2021-11-20 ENCOUNTER — Other Ambulatory Visit: Payer: Self-pay | Admitting: Registered Nurse

## 2021-11-20 DIAGNOSIS — J441 Chronic obstructive pulmonary disease with (acute) exacerbation: Secondary | ICD-10-CM

## 2021-11-20 DIAGNOSIS — J449 Chronic obstructive pulmonary disease, unspecified: Secondary | ICD-10-CM

## 2021-11-20 MED ORDER — ALBUTEROL SULFATE HFA 108 (90 BASE) MCG/ACT IN AERS: INHALATION_SPRAY | RESPIRATORY_TRACT | 11 refills | Status: DC

## 2021-11-20 MED ORDER — STIOLTO RESPIMAT 2.5-2.5 MCG/ACT IN AERS: 1.0000 | INHALATION_SPRAY | Freq: Every day | RESPIRATORY_TRACT | 12 refills | Status: DC

## 2021-12-01 ENCOUNTER — Telehealth: Payer: Self-pay | Admitting: Registered Nurse

## 2021-12-01 NOTE — Telephone Encounter (Signed)
Encourage patient to contact the pharmacy for refills or they can request refills through [facility]  (Please schedule appointment if patient has not been seen in over a year)    WHAT PHARMACY WOULD THEY LIKE THIS SENT TO: [id number] [address]   MEDICATION NAME & DOSE: Citalopram 20 mg, promethazine 25 mg, xanax 1 mg, ondansetron 8 mg.   NOTES/COMMENTS FROM PATIENT: Pt called stating  that her pharmacy doesn't have any orders for these medications. PT had issues with this pharmacy in the past. Please advise.      [place] office please notify patient: It takes 48-72 hours to process rx refill requests Ask patient to call pharmacy to ensure rx is ready before heading there.

## 2021-12-06 ENCOUNTER — Other Ambulatory Visit: Payer: Self-pay | Admitting: Registered Nurse

## 2021-12-06 DIAGNOSIS — F32A Depression, unspecified: Secondary | ICD-10-CM

## 2021-12-06 DIAGNOSIS — R112 Nausea with vomiting, unspecified: Secondary | ICD-10-CM

## 2021-12-06 MED ORDER — PROMETHAZINE HCL 25 MG PO TABS: 25.0000 mg | ORAL_TABLET | Freq: Three times a day (TID) | ORAL | 1 refills | Status: DC | PRN

## 2021-12-06 MED ORDER — CITALOPRAM HYDROBROMIDE 20 MG PO TABS
60.0000 mg | ORAL_TABLET | Freq: Every day | ORAL | 3 refills | Status: DC
Start: 2021-12-06 — End: 2021-12-06

## 2021-12-06 MED ORDER — ALPRAZOLAM 1 MG PO TABS: ORAL_TABLET | ORAL | 1 refills | Status: DC

## 2021-12-06 MED ORDER — ONDANSETRON HCL 8 MG PO TABS: 8.0000 mg | ORAL_TABLET | Freq: Three times a day (TID) | ORAL | 5 refills | Status: DC | PRN

## 2021-12-06 NOTE — Telephone Encounter (Signed)
Sent ? ?Thanks, ? ?Rich

## 2022-01-04 ENCOUNTER — Ambulatory Visit: Payer: 59 | Admitting: Pulmonary Disease

## 2022-01-10 ENCOUNTER — Ambulatory Visit: Payer: 59 | Admitting: Primary Care

## 2022-01-29 ENCOUNTER — Telehealth: Payer: Self-pay | Admitting: Registered Nurse

## 2022-01-29 NOTE — Telephone Encounter (Signed)
Caller name: [name] (husband)  On DPR? :yes/no: No  Call back number: [phone]  Provider they see: Maximiano Coss  Reason for call: needs refill citalopram 20 mg Aware that pt needs to establish a new PCP.  [facility] [address] [facility] [phone]

## 2022-01-30 NOTE — Telephone Encounter (Signed)
Should have refills

## 2022-01-31 ENCOUNTER — Telehealth: Payer: Self-pay | Admitting: Registered Nurse

## 2022-01-31 NOTE — Telephone Encounter (Signed)
Encourage patient to contact the pharmacy for refills or they can request refills through [facility]  (Please schedule appointment if patient has not been seen in over a year)    WHAT PHARMACY WOULD THEY LIKE [facility] [phone]  MEDICATION NAME & DOSE: citalopram 60 mg   NOTES/COMMENTS FROM PATIENT: states that ins will not cover 20 mg TID       Front office please notify patient: It takes 48-72 hours to process rx refill requests Ask patient to call pharmacy to ensure rx is ready before heading there.

## 2022-01-31 NOTE — Telephone Encounter (Signed)
Yes- prescription can be changed to Citalopram '60mg'$  (3 tabs of '20mg'$ ) daily, #90, 3 refills

## 2022-01-31 NOTE — Telephone Encounter (Signed)
Was taking this Rx TID but insurance will no longer approve this dosing can we change the dose from 20 mg TID to 60 mg daily?

## 2022-01-31 NOTE — Telephone Encounter (Signed)
Appears they do not make a 60 mg should I rx one 40 mg and one 20 mg to equaly the 60 mg while staying under three pills?

## 2022-02-01 ENCOUNTER — Other Ambulatory Visit: Payer: Self-pay

## 2022-02-01 DIAGNOSIS — F32A Depression, unspecified: Secondary | ICD-10-CM

## 2022-02-01 MED ORDER — CITALOPRAM HYDROBROMIDE 20 MG PO TABS: ORAL_TABLET | ORAL | 2 refills | Status: DC

## 2022-02-01 NOTE — Telephone Encounter (Signed)
Correct-they don't make a '60mg'$  pill.  I find it easier to do 3 tabs of the '20mg'$  daily (#90, 3 refills) as noted below

## 2022-02-01 NOTE — Telephone Encounter (Signed)
Can look into doing a quantity exception for this patient to get TID dose approved

## 2022-02-01 NOTE — Telephone Encounter (Signed)
Per recommendation from Dr Birdie Riddle sent in as 60 mg daily rather than 20 mg TID in hopes this will be approved with clearer wording

## 2022-02-02 ENCOUNTER — Telehealth: Payer: Self-pay

## 2022-02-02 NOTE — Telephone Encounter (Signed)
Chart reviewed, prior patient of Maximiano Coss, NP, on home oxygen, COPD.  Order signed.

## 2022-02-02 NOTE — Telephone Encounter (Signed)
Vicki [name] pt, request received to sign off on home equipment for her COPD   Looks to be for portable O2

## 2022-02-23 ENCOUNTER — Telehealth: Payer: Self-pay | Admitting: Registered Nurse

## 2022-02-23 NOTE — Telephone Encounter (Signed)
Placed forms in Dr Tabori to be signed folder once signed I will fax back  

## 2022-02-23 NOTE — Telephone Encounter (Signed)
Received forms for Home Medical Equipment from [place]. Placed in [employer].

## 2022-02-26 NOTE — Telephone Encounter (Signed)
Form completed and returned to [facility] 

## 2022-02-26 NOTE — Telephone Encounter (Signed)
Forms faxed and placed in scan  

## 2022-03-08 ENCOUNTER — Ambulatory Visit: Payer: Commercial Managed Care - HMO | Admitting: Podiatry

## 2022-03-16 ENCOUNTER — Ambulatory Visit: Payer: Commercial Managed Care - HMO | Admitting: Podiatry

## 2022-04-02 ENCOUNTER — Other Ambulatory Visit: Payer: Self-pay | Admitting: Family Medicine

## 2022-04-02 DIAGNOSIS — F32A Depression, unspecified: Secondary | ICD-10-CM

## 2022-04-02 NOTE — Telephone Encounter (Signed)
Xanax 1 mg LOV: 10/10/21 Last Refill:12/06/21 Upcoming appt: none Pt states she is going to est with Di Kindle ,NP

## 2022-04-03 NOTE — Telephone Encounter (Signed)
Previous patient of Maximiano Coss, NP, plans to establish with Di Kindle in January.  Controlled substance database reviewed.  Alprazolam 1 mg #90 last filled on 01/08/2022 previously 12/06/2021, 11/02/2021.  Temporary refill granted.

## 2022-04-05 ENCOUNTER — Emergency Department (HOSPITAL_COMMUNITY): Payer: Commercial Managed Care - HMO

## 2022-04-05 ENCOUNTER — Inpatient Hospital Stay (HOSPITAL_COMMUNITY)
Admission: EM | Admit: 2022-04-05 | Discharge: 2022-04-09 | DRG: 190 | Disposition: A | Discharge status: Home / Self Care | Payer: Commercial Managed Care - HMO | Attending: Family Medicine | Admitting: Family Medicine

## 2022-04-05 ENCOUNTER — Encounter (HOSPITAL_COMMUNITY): Payer: Self-pay

## 2022-04-05 ENCOUNTER — Other Ambulatory Visit: Payer: Self-pay

## 2022-04-05 DIAGNOSIS — S2241XA Multiple fractures of ribs, right side, initial encounter for closed fracture: Secondary | ICD-10-CM | POA: Diagnosis present

## 2022-04-05 DIAGNOSIS — R0602 Shortness of breath: Principal | ICD-10-CM

## 2022-04-05 DIAGNOSIS — G2581 Restless legs syndrome: Secondary | ICD-10-CM | POA: Diagnosis present

## 2022-04-05 DIAGNOSIS — Y92010 Kitchen of single-family (private) house as the place of occurrence of the external cause: Secondary | ICD-10-CM

## 2022-04-05 DIAGNOSIS — J441 Chronic obstructive pulmonary disease with (acute) exacerbation: Principal | ICD-10-CM | POA: Diagnosis present

## 2022-04-05 DIAGNOSIS — F32A Depression, unspecified: Secondary | ICD-10-CM | POA: Diagnosis present

## 2022-04-05 DIAGNOSIS — J188 Other pneumonia, unspecified organism: Secondary | ICD-10-CM | POA: Diagnosis present

## 2022-04-05 DIAGNOSIS — F17210 Nicotine dependence, cigarettes, uncomplicated: Secondary | ICD-10-CM | POA: Diagnosis present

## 2022-04-05 DIAGNOSIS — W010XXA Fall on same level from slipping, tripping and stumbling without subsequent striking against object, initial encounter: Secondary | ICD-10-CM | POA: Diagnosis present

## 2022-04-05 DIAGNOSIS — J9621 Acute and chronic respiratory failure with hypoxia: Secondary | ICD-10-CM | POA: Diagnosis not present

## 2022-04-05 DIAGNOSIS — Z886 Allergy status to analgesic agent status: Secondary | ICD-10-CM

## 2022-04-05 DIAGNOSIS — E876 Hypokalemia: Secondary | ICD-10-CM | POA: Diagnosis present

## 2022-04-05 DIAGNOSIS — Z888 Allergy status to other drugs, medicaments and biological substances status: Secondary | ICD-10-CM

## 2022-04-05 DIAGNOSIS — J9611 Chronic respiratory failure with hypoxia: Secondary | ICD-10-CM | POA: Diagnosis not present

## 2022-04-05 DIAGNOSIS — I10 Essential (primary) hypertension: Secondary | ICD-10-CM | POA: Diagnosis present

## 2022-04-05 DIAGNOSIS — Z9981 Dependence on supplemental oxygen: Secondary | ICD-10-CM

## 2022-04-05 DIAGNOSIS — R079 Chest pain, unspecified: Secondary | ICD-10-CM

## 2022-04-05 DIAGNOSIS — Z7982 Long term (current) use of aspirin: Secondary | ICD-10-CM

## 2022-04-05 DIAGNOSIS — J440 Chronic obstructive pulmonary disease with acute lower respiratory infection: Secondary | ICD-10-CM | POA: Diagnosis present

## 2022-04-05 DIAGNOSIS — Z1152 Encounter for screening for COVID-19: Secondary | ICD-10-CM

## 2022-04-05 DIAGNOSIS — J189 Pneumonia, unspecified organism: Secondary | ICD-10-CM | POA: Diagnosis not present

## 2022-04-05 DIAGNOSIS — J9610 Chronic respiratory failure, unspecified whether with hypoxia or hypercapnia: Secondary | ICD-10-CM | POA: Diagnosis present

## 2022-04-05 DIAGNOSIS — Z79899 Other long term (current) drug therapy: Secondary | ICD-10-CM

## 2022-04-05 DIAGNOSIS — G4700 Insomnia, unspecified: Secondary | ICD-10-CM | POA: Diagnosis present

## 2022-04-05 DIAGNOSIS — Z885 Allergy status to narcotic agent status: Secondary | ICD-10-CM

## 2022-04-05 DIAGNOSIS — Z880 Allergy status to penicillin: Secondary | ICD-10-CM

## 2022-04-05 DIAGNOSIS — F411 Generalized anxiety disorder: Secondary | ICD-10-CM | POA: Diagnosis present

## 2022-04-05 DIAGNOSIS — Z881 Allergy status to other antibiotic agents status: Secondary | ICD-10-CM

## 2022-04-05 DIAGNOSIS — E785 Hyperlipidemia, unspecified: Secondary | ICD-10-CM | POA: Diagnosis present

## 2022-04-05 LAB — I-STAT VENOUS BLOOD GAS, ED
Acid-Base Excess: 7 mmol/L — ABNORMAL HIGH (ref 0.0–2.0)
Bicarbonate: 32.4 mmol/L — ABNORMAL HIGH (ref 20.0–28.0)
Calcium, Ion: 0.96 mmol/L — ABNORMAL LOW (ref 1.15–1.40)
HCT: 51 % — ABNORMAL HIGH (ref 36.0–46.0)
Hemoglobin: 17.3 g/dL — ABNORMAL HIGH (ref 12.0–15.0)
O2 Saturation: 90 %
Potassium: 3.1 mmol/L — ABNORMAL LOW (ref 3.5–5.1)
Sodium: 134 mmol/L — ABNORMAL LOW (ref 135–145)
TCO2: 34 mmol/L — ABNORMAL HIGH (ref 22–32)
pCO2, Ven: 46.5 mmHg (ref 44–60)
pH, Ven: 7.451 — ABNORMAL HIGH (ref 7.25–7.43)
pO2, Ven: 57 mmHg — ABNORMAL HIGH (ref 32–45)

## 2022-04-05 LAB — BASIC METABOLIC PANEL
Anion gap: 15 (ref 5–15)
BUN: <5 mg/dL — ABNORMAL LOW (ref 6–20)
CO2: 29 mmol/L (ref 22–32)
Calcium: 8.1 mg/dL — ABNORMAL LOW (ref 8.9–10.3)
Chloride: 92 mmol/L — ABNORMAL LOW (ref 98–111)
Creatinine, Ser: 0.74 mg/dL (ref 0.44–1.00)
GFR, Estimated: >60 mL/min (ref >60)
Glucose, Bld: 126 mg/dL — ABNORMAL HIGH (ref 70–99)
Potassium: 3.2 mmol/L — ABNORMAL LOW (ref 3.5–5.1)
Sodium: 136 mmol/L (ref 135–145)

## 2022-04-05 LAB — PROTIME-INR
INR: 1.1 (ref 0.8–1.2)
Prothrombin Time: 13.7 seconds (ref 11.4–15.2)

## 2022-04-05 LAB — CBC
HCT: 46.6 % — ABNORMAL HIGH (ref 36.0–46.0)
Hemoglobin: 16.2 g/dL — ABNORMAL HIGH (ref 12.0–15.0)
MCH: 34.2 pg — ABNORMAL HIGH (ref 26.0–34.0)
MCHC: 34.8 g/dL (ref 30.0–36.0)
MCV: 98.5 fL (ref 80.0–100.0)
Platelets: 261 10*3/uL (ref 150–400)
RBC: 4.73 MIL/uL (ref 3.87–5.11)
RDW: 13.6 % (ref 11.5–15.5)
WBC: 14.1 10*3/uL — ABNORMAL HIGH (ref 4.0–10.5)
nRBC: 0 % (ref 0.0–0.2)

## 2022-04-05 LAB — HEPATIC FUNCTION PANEL
ALT: 17 U/L (ref 0–44)
AST: 30 U/L (ref 15–41)
Albumin: 3 g/dL — ABNORMAL LOW (ref 3.5–5.0)
Alkaline Phosphatase: 146 U/L — ABNORMAL HIGH (ref 38–126)
Bilirubin, Direct: 0.3 mg/dL — ABNORMAL HIGH (ref 0.0–0.2)
Indirect Bilirubin: 0.6 mg/dL (ref 0.3–0.9)
Total Bilirubin: 0.9 mg/dL (ref 0.3–1.2)
Total Protein: 6.8 g/dL (ref 6.5–8.1)

## 2022-04-05 LAB — TROPONIN I (HIGH SENSITIVITY)
Troponin I (High Sensitivity): 8 ng/L (ref <18)
Troponin I (High Sensitivity): 9 ng/L (ref <18)

## 2022-04-05 LAB — LACTIC ACID, PLASMA
Lactic Acid, Venous: 2 mmol/L (ref 0.5–1.9)
Lactic Acid, Venous: 1.3 mmol/L (ref 0.5–1.9)

## 2022-04-05 LAB — I-STAT BETA HCG BLOOD, ED (MC, WL, AP ONLY): I-stat hCG, quantitative: <5 m[IU]/mL (ref <5)

## 2022-04-05 LAB — BRAIN NATRIURETIC PEPTIDE: B Natriuretic Peptide: 59.4 pg/mL (ref 0.0–100.0)

## 2022-04-05 LAB — HIV ANTIBODY (ROUTINE TESTING W REFLEX): HIV Screen 4th Generation wRfx: NONREACTIVE

## 2022-04-05 LAB — RESP PANEL BY RT-PCR (FLU A&B, COVID) ARPGX2
Influenza A by PCR: NEGATIVE
Influenza B by PCR: NEGATIVE
SARS Coronavirus 2 by RT PCR: NEGATIVE

## 2022-04-05 LAB — APTT: aPTT: 36 seconds (ref 24–36)

## 2022-04-05 LAB — D-DIMER, QUANTITATIVE: D-Dimer, Quant: 0.85 ug/mL-FEU — ABNORMAL HIGH (ref 0.00–0.50)

## 2022-04-05 MED ORDER — UMECLIDINIUM BROMIDE 62.5 MCG/ACT IN AEPB
1.0000 | INHALATION_SPRAY | Freq: Every day | RESPIRATORY_TRACT | Status: DC
  Administered 2022-04-06 – 2022-04-09 (×4): 1 via RESPIRATORY_TRACT
  Filled 2022-04-05: qty 7

## 2022-04-05 MED ORDER — SODIUM CHLORIDE 0.9 % IV SOLN
2.0000 g | INTRAVENOUS | Status: DC
  Administered 2022-04-05 – 2022-04-08 (×4): 2 g via INTRAVENOUS
  Filled 2022-04-05 (×6): qty 20

## 2022-04-05 MED ORDER — PREDNISONE 20 MG PO TABS
40.0000 mg | ORAL_TABLET | Freq: Every day | ORAL | Status: DC
  Administered 2022-04-06 – 2022-04-09 (×4): 40 mg via ORAL
  Filled 2022-04-05 (×4): qty 2

## 2022-04-05 MED ORDER — IPRATROPIUM BROMIDE 0.02 % IN SOLN
0.5000 mg | Freq: Four times a day (QID) | RESPIRATORY_TRACT | Status: DC
  Administered 2022-04-05 – 2022-04-06 (×4): 0.5 mg via RESPIRATORY_TRACT
  Filled 2022-04-05 (×4): qty 2.5

## 2022-04-05 MED ORDER — MAGNESIUM SULFATE 2 GM/50ML IV SOLN
2.0000 g | Freq: Once | INTRAVENOUS | Status: AC
  Administered 2022-04-05: 2 g via INTRAVENOUS
  Filled 2022-04-05: qty 50

## 2022-04-05 MED ORDER — ACETAMINOPHEN 325 MG PO TABS
650.0000 mg | ORAL_TABLET | Freq: Four times a day (QID) | ORAL | Status: DC | PRN
  Administered 2022-04-06 (×2): 650 mg via ORAL
  Filled 2022-04-05 (×2): qty 2

## 2022-04-05 MED ORDER — SODIUM CHLORIDE 0.9% FLUSH
3.0000 mL | Freq: Two times a day (BID) | INTRAVENOUS | Status: DC
  Administered 2022-04-05 – 2022-04-08 (×8): 3 mL via INTRAVENOUS

## 2022-04-05 MED ORDER — HYDROXYZINE HCL 25 MG PO TABS
12.5000 mg | ORAL_TABLET | Freq: Three times a day (TID) | ORAL | Status: DC | PRN
  Administered 2022-04-06 – 2022-04-07 (×2): 25 mg via ORAL
  Filled 2022-04-05 (×2): qty 1

## 2022-04-05 MED ORDER — ALPRAZOLAM 0.5 MG PO TABS
1.0000 mg | ORAL_TABLET | Freq: Three times a day (TID) | ORAL | Status: DC | PRN
  Administered 2022-04-05 – 2022-04-09 (×7): 1 mg via ORAL
  Filled 2022-04-05 (×7): qty 2

## 2022-04-05 MED ORDER — GABAPENTIN 300 MG PO CAPS
300.0000 mg | ORAL_CAPSULE | Freq: Two times a day (BID) | ORAL | Status: DC
  Administered 2022-04-05 – 2022-04-09 (×8): 300 mg via ORAL
  Filled 2022-04-05 (×8): qty 1

## 2022-04-05 MED ORDER — TRAZODONE HCL 50 MG PO TABS
50.0000 mg | ORAL_TABLET | Freq: Every evening | ORAL | Status: DC | PRN
  Administered 2022-04-05 – 2022-04-08 (×4): 50 mg via ORAL
  Filled 2022-04-05 (×4): qty 1

## 2022-04-05 MED ORDER — ARFORMOTEROL TARTRATE 15 MCG/2ML IN NEBU
15.0000 ug | INHALATION_SOLUTION | Freq: Two times a day (BID) | RESPIRATORY_TRACT | Status: DC
  Administered 2022-04-06 – 2022-04-09 (×7): 15 ug via RESPIRATORY_TRACT
  Filled 2022-04-05 (×7): qty 2

## 2022-04-05 MED ORDER — LACTATED RINGERS IV BOLUS
1000.0000 mL | Freq: Once | INTRAVENOUS | Status: AC
  Administered 2022-04-05: 1000 mL via INTRAVENOUS

## 2022-04-05 MED ORDER — METHYLPREDNISOLONE SODIUM SUCC 125 MG IJ SOLR
125.0000 mg | Freq: Once | INTRAMUSCULAR | Status: AC
  Administered 2022-04-05: 125 mg via INTRAVENOUS
  Filled 2022-04-05: qty 2

## 2022-04-05 MED ORDER — LACTATED RINGERS IV SOLN: INTRAVENOUS | Status: AC

## 2022-04-05 MED ORDER — PRAZOSIN HCL 1 MG PO CAPS
1.0000 mg | ORAL_CAPSULE | Freq: Every day | ORAL | Status: DC
  Administered 2022-04-05 – 2022-04-08 (×4): 1 mg via ORAL
  Filled 2022-04-05 (×6): qty 1

## 2022-04-05 MED ORDER — EPINEPHRINE 0.3 MG/0.3ML IJ SOAJ: 0.3000 mg | INTRAMUSCULAR | Status: DC | PRN

## 2022-04-05 MED ORDER — ENOXAPARIN SODIUM 40 MG/0.4ML IJ SOSY
40.0000 mg | PREFILLED_SYRINGE | INTRAMUSCULAR | Status: DC
  Administered 2022-04-05 – 2022-04-08 (×4): 40 mg via SUBCUTANEOUS
  Filled 2022-04-05 (×4): qty 0.4

## 2022-04-05 MED ORDER — POTASSIUM CHLORIDE CRYS ER 20 MEQ PO TBCR
40.0000 meq | EXTENDED_RELEASE_TABLET | Freq: Once | ORAL | Status: AC
  Administered 2022-04-05: 40 meq via ORAL
  Filled 2022-04-05: qty 2

## 2022-04-05 MED ORDER — IPRATROPIUM-ALBUTEROL 0.5-2.5 (3) MG/3ML IN SOLN
3.0000 mL | Freq: Once | RESPIRATORY_TRACT | Status: AC
  Filled 2022-04-05: qty 3

## 2022-04-05 MED ORDER — ROPINIROLE HCL 1 MG PO TABS
1.0000 mg | ORAL_TABLET | Freq: Every day | ORAL | Status: DC
  Administered 2022-04-05 – 2022-04-08 (×4): 1 mg via ORAL
  Filled 2022-04-05 (×6): qty 1

## 2022-04-05 MED ORDER — IPRATROPIUM-ALBUTEROL 0.5-2.5 (3) MG/3ML IN SOLN
RESPIRATORY_TRACT | Status: AC
  Administered 2022-04-05: 3 mL via RESPIRATORY_TRACT
  Filled 2022-04-05: qty 3

## 2022-04-05 MED ORDER — ACETAMINOPHEN 650 MG RE SUPP: 650.0000 mg | Freq: Four times a day (QID) | RECTAL | Status: DC | PRN

## 2022-04-05 MED ORDER — IPRATROPIUM-ALBUTEROL 0.5-2.5 (3) MG/3ML IN SOLN
3.0000 mL | Freq: Once | RESPIRATORY_TRACT | Status: AC
  Administered 2022-04-05: 3 mL via RESPIRATORY_TRACT

## 2022-04-05 MED ORDER — ALBUTEROL SULFATE (2.5 MG/3ML) 0.083% IN NEBU
2.5000 mg | INHALATION_SOLUTION | RESPIRATORY_TRACT | Status: DC | PRN
  Administered 2022-04-05: 2.5 mg via RESPIRATORY_TRACT
  Filled 2022-04-05: qty 3

## 2022-04-05 MED ORDER — SODIUM CHLORIDE 0.9 % IV SOLN
500.0000 mg | INTRAVENOUS | Status: DC
  Administered 2022-04-05 – 2022-04-06 (×2): 500 mg via INTRAVENOUS
  Filled 2022-04-05 (×3): qty 5

## 2022-04-05 MED ORDER — KETOROLAC TROMETHAMINE 30 MG/ML IJ SOLN
30.0000 mg | Freq: Four times a day (QID) | INTRAMUSCULAR | Status: DC | PRN
  Administered 2022-04-05: 30 mg via INTRAVENOUS
  Filled 2022-04-05 (×3): qty 1

## 2022-04-05 MED ORDER — POLYETHYLENE GLYCOL 3350 17 G PO PACK: 17.0000 g | PACK | Freq: Every day | ORAL | Status: DC | PRN

## 2022-04-05 NOTE — ED Provider Notes (Signed)
[facility] EMERGENCY DEPARTMENT Provider Note   CSN: [id number] Arrival date & time: 04/05/22  1322     History  Chief Complaint  Patient presents with   Shortness of Breath   Chest Pain   HPI Vicki [name] is a 56 y.o. female with COPD, hypertension, hyperlipidemia presenting for shortness of breath and chest pain.  Symptoms started on Saturday.  Patient stated that her O2 sats are normally in the high 80s to low 90s but she also noticed that her sats were dipping into the 70s.  Home O2 requirement of 5 L for COPD.  Shortness of breath is worse with exertion.  Also endorses associated cough.  Cough is productive with green-yellowish sputum.  Endorses subjective fever.  Chest pain located in the center of her chest and at times radiates to her face.  Worse with coughing.  Non-reproducible.   Shortness of Breath Associated symptoms: chest pain   Chest Pain Associated symptoms: shortness of breath        Home Medications Prior to Admission medications   Medication Sig Start Date End Date Taking? Authorizing Provider  acetaminophen (TYLENOL) 500 MG tablet Take 1,000 mg by mouth every 6 (six) hours as needed for moderate pain.   Yes [provider]  albuterol (VENTOLIN HFA) 108 (90 Base) MCG/ACT inhaler INHALE 2 PUFFS INTO THE LUNGS TWICE A DAY Patient taking differently: Inhale 2 puffs into the lungs every 4 (four) hours. 11/20/21  Yes Maximiano Coss, NP  ALPRAZolam Duanne Moron) 1 MG tablet TAKE 1 TABLET BY MOUTH THREE TIMES A DAY AS NEEDED FOR ANXIETY Patient taking differently: Take 1 mg by mouth 3 (three) times daily as needed for anxiety. 04/03/22  Yes Wendie Agreste, MD  amLODipine (NORVASC) 5 MG tablet Take 1 tablet (5 mg total) by mouth daily. 07/07/21  Yes Maximiano Coss, NP  ASPIRIN LOW DOSE 81 MG EC tablet TAKE 1 TABLET BY MOUTH 2 TIMES DAILY. Patient taking differently: Take 81 mg by mouth in the morning and at bedtime. 03/21/21  Yes  Maximiano Coss, NP  azelastine (ASTELIN) 0.1 % nasal spray Place 1 spray into both nostrils 2 (two) times daily. Use in each nostril as directed Patient taking differently: Place 1 spray into both nostrils daily. 07/07/21  Yes Maximiano Coss, NP  EPINEPHrine (EPIPEN 2-PAK) 0.3 mg/0.3 mL IJ SOAJ injection Inject 0.3 mg into the muscle as needed for anaphylaxis. 12/27/20  Yes Maximiano Coss, NP  famotidine (PEPCID) 20 MG tablet TAKE 1 TABLET BY MOUTH TWICE A DAY (MORNING AND NIGHT) Patient taking differently: Take 20 mg by mouth 2 (two) times daily at 8 am and 10 pm. 11/07/20  Yes Valentina Shaggy, MD  gabapentin (NEURONTIN) 300 MG capsule Take 1 capsule (300 mg total) by mouth 2 (two) times daily. 07/07/21  Yes Maximiano Coss, NP  hydrOXYzine (ATARAX) 25 MG tablet TAKE 0.5-1 TABLETS (12.5-25 MG TOTAL) BY MOUTH 3 (THREE) TIMES DAILY AS NEEDED FOR ANXIETY. 09/18/21  Yes Maximiano Coss, NP  ipratropium-albuterol (DUONEB) 0.5-2.5 (3) MG/3ML SOLN Use 1 vial by nebulization in the morning and at bedtime. 10/10/21  Yes Maximiano Coss, NP  Magnesium Citrate 100 MG TABS Take 1 tablet by mouth at bedtime. Patient taking differently: Take 100 mg by mouth at bedtime. 10/10/21  Yes Maximiano Coss, NP  meclizine (ANTIVERT) 25 MG tablet Take 1 tablet (25 mg total) by mouth 3 (three) times daily as needed for dizziness. 12/27/20  Yes Maximiano Coss, NP  omeprazole (Qulin)  20 MG capsule TAKE 1 CAPSULE BY MOUTH TWICE A DAY Patient taking differently: Take 20 mg by mouth daily. 07/10/21  Yes Maximiano Coss, NP  ondansetron (ZOFRAN) 8 MG tablet Take 1 tablet (8 mg total) by mouth every 8 (eight) hours as needed for nausea or vomiting. 12/06/21  Yes Maximiano Coss, NP  pantoprazole (PROTONIX) 40 MG tablet TAKE 1 TABLET BY MOUTH EVERY DAY Patient taking differently: Take 40 mg by mouth daily. 05/12/21  Yes Maximiano Coss, NP  potassium chloride SA (KLOR-CON M) 20 MEQ tablet Take 1 tablet (20 mEq total) by mouth  daily. 10/10/21  Yes Maximiano Coss, NP  prazosin (MINIPRESS) 1 MG capsule TAKE 1 TO 2 CAPSULES (1 TO 2 MG TOTAL) BY MOUTH AT BEDTIME. Patient taking differently: Take 1-2 mg by mouth at bedtime. 12/27/20  Yes Maximiano Coss, NP  promethazine (PHENERGAN) 25 MG tablet Take 1 tablet (25 mg total) by mouth every 8 (eight) hours as needed. Patient taking differently: Take 25 mg by mouth every 8 (eight) hours as needed for nausea or vomiting. 12/06/21  Yes Maximiano Coss, NP  rizatriptan (MAXALT) 10 MG tablet TAKE 1 TABLET BY MOUTH AS NEEDED FOR MIGRAINE. MAY REPEAT IN 2 HOURS IF NEEDED Patient taking differently: Take 5 mg by mouth 2 (two) times daily as needed for migraine. 10/17/21  Yes Maximiano Coss, NP  rOPINIRole (REQUIP) 2 MG tablet Take 0.5-1 tablets (1-2 mg total) by mouth at bedtime. 10/10/21  Yes Maximiano Coss, NP  STIOLTO RESPIMAT 2.5-2.5 MCG/ACT AERS Inhale 1 puff into the lungs daily. 11/20/21  Yes Maximiano Coss, NP  traZODone (DESYREL) 50 MG tablet Take 1 tablet (50 mg total) by mouth at bedtime. 07/07/21  Yes Maximiano Coss, NP  citalopram (CELEXA) 20 MG tablet Take 60 mg daily Patient not taking: Reported on 04/05/2022 02/01/22   Midge Minium, MD  dapsone 25 MG tablet TAKE 1 TABLET BY MOUTH DAILY FOR 14 DAYS, THEN 2 TABLETS DAILY. Patient not taking: Reported on 04/05/2022 03/02/21   Maximiano Coss, NP  montelukast (SINGULAIR) 10 MG tablet Take 1 tablet (10 mg total) by mouth at bedtime. Patient not taking: Reported on 04/05/2022 07/07/21   Maximiano Coss, NP  nicotine (NICODERM CQ - DOSED IN MG/24 HOURS) 14 mg/24hr patch Place 1 patch (14 mg total) onto the skin daily. Patient not taking: Reported on 04/05/2022 07/10/21   Maximiano Coss, NP  nicotine (NICODERM CQ - DOSED IN MG/24 HOURS) 21 mg/24hr patch PLACE 1 PATCH ONTO THE SKIN DAILY. Patient not taking: Reported on 04/05/2022 08/24/21   Maximiano Coss, NP  nicotine (NICODERM CQ - DOSED IN MG/24 HR) 7 mg/24hr patch Place  1 patch (7 mg total) onto the skin daily. Patient not taking: Reported on 04/05/2022 07/10/21   Maximiano Coss, NP      Allergies    Jasmine oil, Levaquin [levofloxacin], Penicillins, Aleve [naproxen sodium], Ceftin [cefuroxime axetil], Chlorhexidine, Depo-provera [medroxyprogesterone acetate], Doxycycline, Oxycodone, and Tramadol    Review of Systems   Review of Systems  Respiratory:  Positive for shortness of breath.   Cardiovascular:  Positive for chest pain.    Physical Exam Updated Vital Signs BP 109/79 (BP Location: Right Arm)   Pulse (!) 124   Temp 99.4 F (37.4 C) (Oral)   Resp (!) 24   Ht '5\' 3"'$  (1.6 m)   Wt 68 kg   SpO2 92%   BMI 26.57 kg/m  Physical Exam Vitals and nursing note reviewed.  Constitutional:  Appearance: She is ill-appearing.  HENT:     Head: Normocephalic and atraumatic.     Mouth/Throat:     Mouth: Mucous membranes are moist.  Eyes:     General:        Right eye: No discharge.        Left eye: No discharge.     Conjunctiva/sclera: Conjunctivae normal.  Cardiovascular:     Rate and Rhythm: Regular rhythm. Tachycardia present.     Pulses: Normal pulses.     Heart sounds: Normal heart sounds.  Pulmonary:     Effort: Pulmonary effort is normal.     Breath sounds: Decreased breath sounds and rhonchi present. No wheezing or rales.  Abdominal:     General: Abdomen is flat.     Palpations: Abdomen is soft.  Skin:    General: Skin is warm and dry.  Neurological:     General: No focal deficit present.  Psychiatric:        Mood and Affect: Mood normal.     ED Results / Procedures / Treatments   Labs (all labs ordered are listed, but only abnormal results are displayed) Labs Reviewed  BASIC METABOLIC PANEL - Abnormal; Notable for the following components:      Result Value   Potassium 3.2 (*)    Chloride 92 (*)    Glucose, Bld 126 (*)    BUN <5 (*)    Calcium 8.1 (*)    All other components within normal limits  CBC - Abnormal;  Notable for the following components:   WBC 14.1 (*)    Hemoglobin 16.2 (*)    HCT 46.6 (*)    MCH 34.2 (*)    All other components within normal limits  LACTIC ACID, PLASMA - Abnormal; Notable for the following components:   Lactic Acid, Venous 2.0 (*)    All other components within normal limits  HEPATIC FUNCTION PANEL - Abnormal; Notable for the following components:   Albumin 3.0 (*)    Alkaline Phosphatase 146 (*)    Bilirubin, Direct 0.3 (*)    All other components within normal limits  I-STAT VENOUS BLOOD GAS, ED - Abnormal; Notable for the following components:   pH, Ven 7.451 (*)    pO2, Ven 57 (*)    Bicarbonate 32.4 (*)    TCO2 34 (*)    Acid-Base Excess 7.0 (*)    Sodium 134 (*)    Potassium 3.1 (*)    Calcium, Ion 0.96 (*)    HCT 51.0 (*)    Hemoglobin 17.3 (*)    All other components within normal limits  RESP PANEL BY RT-PCR (FLU A&B, COVID) ARPGX2  CULTURE, BLOOD (ROUTINE X 2)  CULTURE, BLOOD (ROUTINE X 2)  URINE CULTURE  BRAIN NATRIURETIC PEPTIDE  LACTIC ACID, PLASMA  PROTIME-INR  APTT  URINALYSIS, ROUTINE W REFLEX MICROSCOPIC  D-DIMER, QUANTITATIVE  CBC WITH DIFFERENTIAL/PLATELET  HIV ANTIBODY (ROUTINE TESTING W REFLEX)  I-STAT BETA HCG BLOOD, ED ([facility], [facility], [facility] ONLY)  TROPONIN I (HIGH SENSITIVITY)  TROPONIN I (HIGH SENSITIVITY)    EKG EKG Interpretation  Date/Time:  Thursday April 05 2022 13:22:12 EST Ventricular Rate:  121 PR Interval:  138 QRS Duration: 60 QT Interval:  316 QTC Calculation: 448 R Axis:   70 Text Interpretation: Sinus tachycardia Otherwise normal ECG When compared with ECG of 25-Apr-2021 18:56, SINCE LAST TRACING HEART RATE HAS INCREASED Confirmed by Garnette Gunner [phone]) on 04/05/2022 1:38:57 PM  Radiology DG Chest 1 View  Result Date: 04/05/2022 CLINICAL DATA:  A [age]-year-old female presents for evaluation of cough and shortness of breath and nausea. EXAM: CHEST  1 VIEW COMPARISON:  May 01, 2021. FINDINGS: EKG  leads project over the chest. Trachea midline. Cardiomediastinal contours and hilar structures are normal. Patchy basilar opacities greatest on the LEFT in the mid and lower chest but also present on the RIGHT in the mid and lower chest. No lobar level consolidative changes. No pneumothorax. No gross effusion. Skeletal structures are unremarkable to the extent evaluated on limited evaluation. IMPRESSION: Patchy basilar opacities greatest on the LEFT in the mid and lower chest but also present on the RIGHT in the mid and lower chest. Findings are suspicious for multifocal infection. Would also include the possibility of COVID-19. Electronically Signed   By: Zetta Bills M.D.   On: 04/05/2022 14:33    Procedures Procedures    Medications Ordered in ED Medications  lactated ringers infusion ( Intravenous New Bag/Given 04/05/22 1409)  cefTRIAXone (ROCEPHIN) 2 g in sodium chloride 0.9 % 100 mL IVPB (0 g Intravenous Stopped 04/05/22 1525)  azithromycin (ZITHROMAX) 500 mg in sodium chloride 0.9 % 250 mL IVPB (0 mg Intravenous Stopped 04/05/22 1554)  EPINEPHrine (EPI-PEN) injection 0.3 mg (has no administration in time range)  rOPINIRole (REQUIP) tablet 1-2 mg (has no administration in time range)  arformoterol (BROVANA) nebulizer solution 15 mcg (has no administration in time range)    And  umeclidinium bromide (INCRUSE ELLIPTA) 62.5 MCG/ACT 1 puff (has no administration in time range)  predniSONE (DELTASONE) tablet 40 mg (has no administration in time range)  ipratropium (ATROVENT) nebulizer solution 0.5 mg (has no administration in time range)  albuterol (PROVENTIL) (2.5 MG/3ML) 0.083% nebulizer solution 2.5 mg (has no administration in time range)  enoxaparin (LOVENOX) injection 40 mg (has no administration in time range)  sodium chloride flush (NS) 0.9 % injection 3 mL (3 mLs Intravenous Given 04/05/22 1554)  acetaminophen (TYLENOL) tablet 650 mg (has no administration in time range)    Or   acetaminophen (TYLENOL) suppository 650 mg (has no administration in time range)  polyethylene glycol (MIRALAX / GLYCOLAX) packet 17 g (has no administration in time range)  potassium chloride SA (KLOR-CON M) CR tablet 40 mEq (has no administration in time range)  ipratropium-albuterol (DUONEB) 0.5-2.5 (3) MG/3ML nebulizer solution 3 mL (3 mLs Nebulization Given 04/05/22 1441)  methylPREDNISolone sodium succinate (SOLU-MEDROL) 125 mg/2 mL injection 125 mg (125 mg Intravenous Given 04/05/22 1428)  ipratropium-albuterol (DUONEB) 0.5-2.5 (3) MG/3ML nebulizer solution 3 mL (3 mLs Nebulization Given 04/05/22 1416)  magnesium sulfate IVPB 2 g 50 mL (0 g Intravenous Stopped 04/05/22 1545)    ED Course/ Medical Decision Making/ A&P                           Medical Decision Making Amount and/or Complexity of Data Reviewed Labs: ordered. Radiology: ordered.  Risk Prescription drug management.   This patient presents to the ED for concern of shortness of breath and chest pain, this involves a number of treatment options, and is a complaint that carries with it a high risk of complications and morbidity.  The differential diagnosis includes pneumonia, PE, ACS, COVID or flu.   Co morbidities: Discussed in HPI   EMR reviewed including pt PMHx, past surgical history and past visits to ER.   See HPI for more details   Lab Tests:   I independently viewed and interpreted labs.  Labs notable for leukocytosis   Imaging Studies:  Abnormal findings. I personally reviewed and interpreted all imaging studies. Imaging notable for bibasilar patch opacities.    Cardiac Monitoring:  The patient was maintained on a cardiac monitor.  I personally viewed and interpreted the cardiac monitored which showed an underlying rhythm of: sinus tachycardia EKG non-ischemic   Medicines ordered:  I ordered medication including DuoNeb, IV Solu-Medrol, IV mag for COPD exacerbation and azithromycin and  ceftriaxone for sepsis   sepsisReevaluation of the patient after these medicines showed that the patient improved I have reviewed the patients home medicines and have made adjustments as needed   Critical Interventions:  None   Consults/Attending Physician   I discussed this case with my attending physician who cosigned this note including patient's presenting symptoms, physical exam, and planned diagnostics and interventions. Attending physician stated agreement with plan or made changes to plan which were implemented.   Reevaluation:  After the interventions noted above I re-evaluated patient and found that they have :improved    Problem List / ED Course: Patient presented for shortness of breath and chest pain.  On exam patient was tachycardic, tachypneic with rhonchorous and diminished breath sounds.  Initial concern was sepsis secondary to pneumonia and COPD exacerbation.  Initiated code sepsis and treated COPD exacerbation.  Upon reevaluation patient stated that work of breathing was improved.  X-ray did reveal concern for multifocal pneumonia.  Symptoms are likely consistent with both sepsis secondary to pneumonia with superimposed COPD exacerbation.  Admitted to hospital service.  Also ordered pending D-dimer for possible concern of pulmonary embolism.  Advised hospitalist service to follow-up on result.  Further PE study may be warranted if positive.   Dispostion:  After consideration of the diagnostic results and the patients response to treatment, I feel that the patient would benefit from admission to the hospital         Final Clinical Impression(s) / ED Diagnoses Final diagnoses:  Shortness of breath  Chest pain, unspecified type    Rx / DC Orders ED Discharge Orders     None         Harriet Pho, PA-C 04/05/22 1555    Cristie Hem, MD 04/05/22 [phone]

## 2022-04-05 NOTE — Sepsis Progress Note (Signed)
eLink is following this Code Sepsis. °

## 2022-04-05 NOTE — ED Notes (Signed)
ED TO INPATIENT HANDOFF REPORT  ED Nurse Name and Phone #:Lilyonna Steidle  [phone]  S Name/Age/Gender Vicki [name] 56 y.o. female Room/Bed: 010C/010C  Code Status   Code Status: Full Code  Home/SNF/Other Home Patient oriented to: self, place, time, and situation Is this baseline? Yes   Triage Complete: Triage complete  Chief Complaint COPD exacerbation [facility]) [J44.1]  Triage Note Pt arrived POV from home c/o [facility], N/V, cough and congestion since Saturday. Pt wears 5L [place] at home and states she was sating 79% on her 5L. Pt states she just does not feel good. Pt is currently sating 91% on 5L.    Allergies Allergies  Allergen Reactions   Jasmine Oil Shortness Of Breath and Swelling   Levaquin [Levofloxacin] Shortness Of Breath, Itching, Swelling and Rash   Penicillins Anaphylaxis and Hives    Tolerated ceftriaxone   Aleve [Naproxen Sodium] Itching   Ceftin [Cefuroxime Axetil] Other (See Comments)    unsure   Chlorhexidine    Depo-Provera [Medroxyprogesterone Acetate] Other (See Comments)    Severe acne   Doxycycline Other (See Comments)    Raises blood pressure    Oxycodone Nausea And Vomiting   Tramadol Other (See Comments)    Kept her up all night    Level of Care/Admitting Diagnosis ED Disposition     ED Disposition  Admit   Condition  --   [place]: [place] [[id number]]  Level of Care: Telemetry Medical [104]  May place patient in observation at [facility] or [place] if equivalent level of care is available:: No  Covid Evaluation: Confirmed COVID Negative  Diagnosis: COPD exacerbation [facility]) [[id number]]  Admitting Physician: Marcelyn Bruins [[id number]]  Attending Physician: Marcelyn Bruins [[id number]]          B Medical/Surgery History Past Medical History:  Diagnosis Date   Allergy    seasonal allergies   Anxiety    on meds   Arthritis    hands   Asthma    childhood-current   CAP (community acquired  pneumonia) 04/07/2019   Carpal tunnel syndrome    had bilateral sx to tx   Cataract    bilateral--no sx yet   COPD (chronic obstructive pulmonary disease) ([place])    Depression    on meds   Diverticulitis    Emphysema of lung ([place])    uses MDI   Essential hypertension, benign 07/14/2019   on meds   Hyperlipidemia    on meds   Oxygen deficiency    home O2 at 2.5L/[place] nightly   Oxygen dependent    uses O2 at 2.5L/[place] nightly   Past Surgical History:  Procedure Laterality Date   ABDOMINAL HYSTERECTOMY  2009   APPENDECTOMY     ARTHROSCOPIC REPAIR ACL Left 2011/2014   x 2   BIOPSY  01/21/2020   Procedure: BIOPSY;  Surgeon: Mauri Pole, MD;  Location: [facility];  Service: Endoscopy;;  EGD and COLON   BUNIONECTOMY  2006   both   CARPAL TUNNEL RELEASE Bilateral 10/28/2013   Procedure: BILATERAL CARPAL TUNNEL RELEASE;  Surgeon: Wynonia Sours, MD;  Location: [place];  Service: Orthopedics;  Laterality: Bilateral;   CHOLECYSTECTOMY  1994   CHONDROPLASTY Right 08/31/2020   Procedure: CHONDROPLASTY;  Surgeon: Earlie Server, MD;  Location: [place];  Service: Orthopedics;  Laterality: Right;   COLONOSCOPY WITH PROPOFOL N/A 01/21/2020   Procedure: COLONOSCOPY WITH PROPOFOL;  Surgeon: Mauri Pole, MD;  Location: [facility];  Service: Endoscopy;  Laterality: N/A;   DILATION AND CURETTAGE OF UTERUS     ESOPHAGOGASTRODUODENOSCOPY (EGD) WITH PROPOFOL N/A 01/21/2020   Procedure: ESOPHAGOGASTRODUODENOSCOPY (EGD) WITH PROPOFOL;  Surgeon: Mauri Pole, MD;  Location: [facility];  Service: Endoscopy;  Laterality: N/A;   KNEE ARTHROSCOPY WITH ANTERIOR CRUCIATE LIGAMENT (ACL) REPAIR Right 08/31/2020   Procedure: RIGHT KNEE ARTHROSCOPY WITH ANTERIOR CRUCIATE LIGAMENT (ACL) REPAIR MEDIAL AND LATERAL  MENISECTOMY;  Surgeon: Earlie Server, MD;  Location: [place];  Service: Orthopedics;  Laterality: Right;  FEMORAL NERVE BLOCK    POLYPECTOMY  01/21/2020   Procedure: POLYPECTOMY;  Surgeon: Mauri Pole, MD;  Location: [facility];  Service: Endoscopy;;   TUBAL LIGATION  2004     A IV Location/Drains/Wounds Patient Lines/Drains/Airways Status     Active Line/Drains/Airways     Name Placement date Placement time Site Days   Peripheral IV 04/05/22 20 G Anterior;Distal;Right Forearm 04/05/22  1358  Forearm  less than 1   Peripheral IV 04/05/22 20 G Left Antecubital 04/05/22  1359  Antecubital  less than 1   External Urinary Catheter 04/27/21  2000  --  343   Incision (Closed) 08/31/20 Leg Right 08/31/20  1258  -- 582            Intake/Output Last 24 hours No intake or output data in the 24 hours ending 04/05/22 1702  Labs/Imaging Results for orders placed or performed during the hospital encounter of 04/05/22 (from the past 48 hour(s))  Basic metabolic panel     Status: Abnormal   Collection Time: 04/05/22  1:36 PM  Result Value Ref Range   Sodium 136 135 - 145 mmol/L   Potassium 3.2 (L) 3.5 - 5.1 mmol/L   Chloride 92 (L) 98 - 111 mmol/L   CO2 29 22 - 32 mmol/L   Glucose, Bld 126 (H) 70 - 99 mg/dL    Comment: Glucose reference range applies only to samples taken after fasting for at least 8 hours.   BUN <5 (L) 6 - 20 mg/dL   Creatinine, Ser 0.74 0.44 - 1.00 mg/dL   Calcium 8.1 (L) 8.9 - 10.3 mg/dL   GFR, Estimated >60 >60 mL/min    Comment: (NOTE) Calculated using the CKD-EPI Creatinine Equation (2021)    Anion gap 15 5 - 15    Comment: Performed at [facility] [address] [place], [place] [address]  CBC     Status: Abnormal   Collection Time: 04/05/22  1:36 PM  Result Value Ref Range   WBC 14.1 (H) 4.0 - 10.5 K/uL   RBC 4.73 3.87 - 5.11 MIL/uL   Hemoglobin 16.2 (H) 12.0 - 15.0 g/dL   HCT 46.6 (H) 36.0 - 46.0 %   MCV 98.5 80.0 - 100.0 fL   MCH 34.2 (H) 26.0 - 34.0 pg   MCHC 34.8 30.0 - 36.0 g/dL   RDW 13.6 11.5 - 15.5 %   Platelets 261 150 - 400 K/uL   nRBC 0.0 0.0 - 0.2  %    Comment: Performed at [facility], [place] [address], [place], [place] [address]  Troponin I (High Sensitivity)     Status: None   Collection Time: 04/05/22  1:36 PM  Result Value Ref Range   Troponin I (High Sensitivity) 8 <18 ng/L    Comment: (NOTE) Elevated high sensitivity troponin I (hsTnI) values and significant  changes across serial measurements may suggest ACS but many  other  chronic and acute conditions are known to elevate hsTnI results.  Refer to the "Links" section for chest pain algorithms and additional  guidance. Performed at [facility], [place] [address], [place], [place] [address]   Brain natriuretic peptide     Status: None   Collection Time: 04/05/22  1:36 PM  Result Value Ref Range   B Natriuretic Peptide 59.4 0.0 - 100.0 pg/mL    Comment: Performed at [facility] [address], [place], [place] [address]  Hepatic function panel     Status: Abnormal   Collection Time: 04/05/22  1:36 PM  Result Value Ref Range   Total Protein 6.8 6.5 - 8.1 g/dL   Albumin 3.0 (L) 3.5 - 5.0 g/dL   AST 30 15 - 41 U/L   ALT 17 0 - 44 U/L   Alkaline Phosphatase 146 (H) 38 - 126 U/L   Total Bilirubin 0.9 0.3 - 1.2 mg/dL   Bilirubin, Direct 0.3 (H) 0.0 - 0.2 mg/dL   Indirect Bilirubin 0.6 0.3 - 0.9 mg/dL    Comment: Performed at [facility] [address] [place],  [address]  I-Stat beta hCG blood, ED     Status: None   Collection Time: 04/05/22  1:51 PM  Result Value Ref Range   I-stat hCG, quantitative <5.0 <5 mIU/mL   Comment 3            Comment:   GEST. AGE      CONC.  (mIU/mL)   <=1 WEEK        5 - 50     2 WEEKS       50 - 500     3 WEEKS       100 - 10,000     4 WEEKS     1,000 - 30,000        FEMALE AND NON-PREGNANT FEMALE:     LESS THAN 5 mIU/mL   Resp Panel by RT-PCR (Flu A&B, Covid) Anterior Nasal Swab     Status: None   Collection Time: 04/05/22  1:51 PM   Specimen: Anterior Nasal Swab  Result Value Ref Range   SARS  Coronavirus 2 by RT PCR NEGATIVE NEGATIVE    Comment: (NOTE) SARS-CoV-2 target nucleic acids are NOT DETECTED.  The SARS-CoV-2 RNA is generally detectable in upper respiratory specimens during the acute phase of infection. The lowest concentration of SARS-CoV-2 viral copies this assay can detect is 138 copies/mL. A negative result does not preclude SARS-Cov-2 infection and should not be used as the sole basis for treatment or other patient management decisions. A negative result may occur with  improper specimen collection/handling, submission of specimen other than nasopharyngeal swab, presence of viral mutation(s) within the areas targeted by this assay, and inadequate number of viral copies(<138 copies/mL). A negative result must be combined with clinical observations, patient history, and epidemiological information. The expected result is Negative.  Fact Sheet for Patients:  EntrepreneurPulse.com.au  Fact Sheet for Healthcare Providers:  IncredibleEmployment.be  This test is no t yet approved or cleared by the [place] FDA and  has been authorized for detection and/or diagnosis of SARS-CoV-2 by FDA under an Emergency Use Authorization (EUA). This EUA will remain  in effect (meaning this test can be used) for the duration of the COVID-19 declaration under Section 564(b)(1) of the Act, 21 U.S.C.section 360bbb-3(b)(1), unless the authorization is terminated  or revoked sooner.  Influenza A by PCR NEGATIVE NEGATIVE   Influenza B by PCR NEGATIVE NEGATIVE    Comment: (NOTE) The Xpert Xpress SARS-CoV-2/FLU/RSV plus assay is intended as an aid in the diagnosis of influenza from Nasopharyngeal swab specimens and should not be used as a sole basis for treatment. Nasal washings and aspirates are unacceptable for Xpert Xpress SARS-CoV-2/FLU/RSV testing.  Fact Sheet for Patients: EntrepreneurPulse.com.au  Fact Sheet  for Healthcare Providers: IncredibleEmployment.be  This test is not yet approved or cleared by the [place] FDA and has been authorized for detection and/or diagnosis of SARS-CoV-2 by FDA under an Emergency Use Authorization (EUA). This EUA will remain in effect (meaning this test can be used) for the duration of the COVID-19 declaration under Section 564(b)(1) of the Act, 21 U.S.C. section 360bbb-3(b)(1), unless the authorization is terminated or revoked.  Performed at [facility], [place] [address], [place], [place] [address]   I-Stat venous blood gas, ED     Status: Abnormal   Collection Time: 04/05/22  1:53 PM  Result Value Ref Range   pH, Ven 7.451 (H) 7.25 - 7.43   pCO2, Ven 46.5 44 - 60 mmHg   pO2, Ven 57 (H) 32 - 45 mmHg   Bicarbonate 32.4 (H) 20.0 - 28.0 mmol/L   TCO2 34 (H) 22 - 32 mmol/L   O2 Saturation 90 %   Acid-Base Excess 7.0 (H) 0.0 - 2.0 mmol/L   Sodium 134 (L) 135 - 145 mmol/L   Potassium 3.1 (L) 3.5 - 5.1 mmol/L   Calcium, Ion 0.96 (L) 1.15 - 1.40 mmol/L   HCT 51.0 (H) 36.0 - 46.0 %   Hemoglobin 17.3 (H) 12.0 - 15.0 g/dL   Sample type VENOUS   Lactic acid, plasma     Status: Abnormal   Collection Time: 04/05/22  1:54 PM  Result Value Ref Range   Lactic Acid, Venous 2.0 (HH) 0.5 - 1.9 mmol/L    Comment: CRITICAL RESULT CALLED TO, READ BACK BY AND VERIFIED WITH Madolyn Ackroyd A,RN '@[id number]'$  04/05/22. [facility] Performed at [facility], [place] [address] [place], [place] [address]   Lactic acid, plasma     Status: None   Collection Time: 04/05/22  4:05 PM  Result Value Ref Range   Lactic Acid, Venous 1.3 0.5 - 1.9 mmol/L    Comment: Performed at [facility] [address], [place], [place] [address]  Troponin I (High Sensitivity)     Status: None   Collection Time: 04/05/22  4:05 PM  Result Value Ref Range   Troponin I (High Sensitivity) 9 <18 ng/L    Comment: (NOTE) Elevated high sensitivity troponin I (hsTnI) values and  significant  changes across serial measurements may suggest ACS but many other  chronic and acute conditions are known to elevate hsTnI results.  Refer to the "Links" section for chest pain algorithms and additional  guidance. Performed at [facility], [place] [address] [place], [place] [address]    DG Chest 1 View  Result Date: 04/05/2022 CLINICAL DATA:  A [age]-year-old female presents for evaluation of cough and shortness of breath and nausea. EXAM: CHEST  1 VIEW COMPARISON:  May 01, 2021. FINDINGS: EKG leads project over the chest. Trachea midline. Cardiomediastinal contours and hilar structures are normal. Patchy basilar opacities greatest on the LEFT in the mid and lower chest but also present on the RIGHT in the mid and lower chest. No lobar level consolidative changes. No pneumothorax. No gross  effusion. Skeletal structures are unremarkable to the extent evaluated on limited evaluation. IMPRESSION: Patchy basilar opacities greatest on the LEFT in the mid and lower chest but also present on the RIGHT in the mid and lower chest. Findings are suspicious for multifocal infection. Would also include the possibility of COVID-19. Electronically Signed   By: Zetta Bills M.D.   On: 04/05/2022 14:33    Pending Labs Unresulted Labs (From admission, onward)     Start     Ordered   04/12/22 0500  Creatinine, serum  (enoxaparin (LOVENOX)    CrCl >/= 30 ml/min)  Weekly,   R     Comments: while on enoxaparin therapy    04/05/22 1535   04/06/22 0500  CBC  Tomorrow morning,   R        04/05/22 1535   04/06/22 0500  Comprehensive metabolic panel  Tomorrow morning,   R        04/05/22 1535   04/05/22 1530  HIV Antibody (routine testing w rflx)  (HIV Antibody (Routine testing w reflex) panel)  Once,   R        04/05/22 1535   04/05/22 1430  CBC with Differential/Platelet  Once,   STAT        04/05/22 1430   04/05/22 1401  D-dimer, quantitative  Once,   STAT        04/05/22 1402    04/05/22 1351  Protime-INR  (Septic presentation on arrival (screening labs, nursing and treatment orders for obvious sepsis))  ONCE - STAT,   STAT        04/05/22 1353   04/05/22 1351  APTT  (Septic presentation on arrival (screening labs, nursing and treatment orders for obvious sepsis))  ONCE - STAT,   STAT        04/05/22 1353   04/05/22 1351  Blood Culture (routine x 2)  (Septic presentation on arrival (screening labs, nursing and treatment orders for obvious sepsis))  BLOOD CULTURE X 2,   STAT      04/05/22 1353   04/05/22 1351  Urinalysis, Routine w reflex microscopic  (Septic presentation on arrival (screening labs, nursing and treatment orders for obvious sepsis))  ONCE - URGENT,   URGENT        04/05/22 1353   04/05/22 1351  Urine Culture  (Septic presentation on arrival (screening labs, nursing and treatment orders for obvious sepsis))  ONCE - URGENT,   URGENT       Question:  Indication  Answer:  Sepsis   04/05/22 1353            Vitals/Pain Today's Vitals   04/05/22 1326 04/05/22 1327 04/05/22 1328  BP: 109/79    Pulse: (!) 124    Resp: (!) 24    Temp: 99.4 F (37.4 C)    TempSrc: Oral    SpO2: 92%    Weight:   68 kg  Height:   '5\' 3"'$  (1.6 m)  PainSc:  7      Isolation Precautions No active isolations  Medications Medications  lactated ringers infusion ( Intravenous New Bag/Given 04/05/22 1409)  cefTRIAXone (ROCEPHIN) 2 g in sodium chloride 0.9 % 100 mL IVPB (0 g Intravenous Stopped 04/05/22 1525)  azithromycin (ZITHROMAX) 500 mg in sodium chloride 0.9 % 250 mL IVPB (0 mg Intravenous Stopped 04/05/22 1554)  EPINEPHrine (EPI-PEN) injection 0.3 mg (has no administration in time range)  rOPINIRole (REQUIP) tablet 1-2 mg (has no administration in time range)  arformoterol (  BROVANA) nebulizer solution 15 mcg (has no administration in time range)    And  umeclidinium bromide (INCRUSE ELLIPTA) 62.5 MCG/ACT 1 puff (has no administration in time range)  predniSONE  (DELTASONE) tablet 40 mg (has no administration in time range)  ipratropium (ATROVENT) nebulizer solution 0.5 mg (0.5 mg Nebulization Given 04/05/22 1657)  albuterol (PROVENTIL) (2.5 MG/3ML) 0.083% nebulizer solution 2.5 mg (has no administration in time range)  enoxaparin (LOVENOX) injection 40 mg (40 mg Subcutaneous Given 04/05/22 1658)  sodium chloride flush (NS) 0.9 % injection 3 mL (3 mLs Intravenous Given 04/05/22 1554)  acetaminophen (TYLENOL) tablet 650 mg (has no administration in time range)    Or  acetaminophen (TYLENOL) suppository 650 mg (has no administration in time range)  polyethylene glycol (MIRALAX / GLYCOLAX) packet 17 g (has no administration in time range)  ketorolac (TORADOL) 30 MG/ML injection 30 mg (has no administration in time range)  ALPRAZolam (XANAX) tablet 1 mg (has no administration in time range)  gabapentin (NEURONTIN) capsule 300 mg (has no administration in time range)  hydrOXYzine (ATARAX) tablet 12.5-25 mg (has no administration in time range)  prazosin (MINIPRESS) capsule 1-2 mg (has no administration in time range)  traZODone (DESYREL) tablet 50 mg (has no administration in time range)  lactated ringers bolus 1,000 mL (has no administration in time range)  ipratropium-albuterol (DUONEB) 0.5-2.5 (3) MG/3ML nebulizer solution 3 mL (3 mLs Nebulization Given 04/05/22 1441)  methylPREDNISolone sodium succinate (SOLU-MEDROL) 125 mg/2 mL injection 125 mg (125 mg Intravenous Given 04/05/22 1428)  ipratropium-albuterol (DUONEB) 0.5-2.5 (3) MG/3ML nebulizer solution 3 mL (3 mLs Nebulization Given 04/05/22 1416)  magnesium sulfate IVPB 2 g 50 mL (0 g Intravenous Stopped 04/05/22 1545)  potassium chloride SA (KLOR-CON M) CR tablet 40 mEq (40 mEq Oral Given 04/05/22 1657)    Mobility walks High fall risk   Focused Assessments Pulmonary Assessment Handoff:  Lung sounds:   O2 Device: Nasal Cannula O2 Flow Rate (L/min): 5 L/min    R Recommendations: See  Admitting Provider Note  Report given to:   Additional Notes:

## 2022-04-05 NOTE — ED Triage Notes (Signed)
Pt arrived POV from home c/o [facility], N/V, cough and congestion since Saturday. Pt wears 5L  at home and states she was sating 79% on her 5L. Pt states she just does not feel good. Pt is currently sating 91% on 5L.

## 2022-04-05 NOTE — H&P (Signed)
History and Physical   Vicki [name] QSX:[id number] DOB: July 30, 1965 DOA: 04/05/2022  PCP: Pcp, No   Patient coming from: Home  Chief Complaint: Shortness of breath  HPI: Vicki [name] is a 56 y.o. female with medical history significant of chronic respiratory failure on 5 L of oxygen, COPD, asthma, depression, anxiety, insomnia, hyperlipidemia, RLS, hypertension, drug allergies presenting with cough and shortness of breath.  Patient has had 5 days of cough, congestion with increased shortness of breath.  Is on chronic 5 L at home and usually saturates in the high 80s to low 90s noted to be saturating in the upper 70s recently.  She tried her home inhalers without improvement.  Also reporting increased dyspnea on exertion, green-yellow sputum production, subjective fever, chest pain worse with cough.  Also reporting episodes of nausea and vomiting.  EMS was called due to worsening symptoms and transported patient to the ED.  She denies chills, abdominal pain, constipation, diarrhea, nausea, vomiting.  ED Course: Vital signs in the ED significant for initial tachycardia in the 120s improved to the 100s with initial treatments, respiratory rate in the teens to 20s, blood pressure in the 71L to 597 systolic, in the ED is saturating well on her home 5 L.  Lab workup included CMP with potassium 3.2, chloride 92, glucose 126, calcium 8.1, albumin 3.0, alk phos 146.  CBC with leukocytosis to 14.1, hemoglobin 616.2.  PT, PTT, INR pending.  Troponin normal with repeat pending.  Lactic acid borderline at 2 with repeat pending.  BNP normal.  Respiratory panel for flu and COVID-negative.  Urinalysis, urine culture, blood culture pending.  Chest x-ray showed patchy opacities left greater than right consistent with pneumonia.  Patient received ceftriaxone, azithromycin in the ED.  Also received Solu-Medrol, DuoNebs, magnesium, started on IV fluids.  Review of Systems: As per HPI otherwise all  other systems reviewed and are negative.  Past Medical History:  Diagnosis Date   Allergy    seasonal allergies   Anxiety    on meds   Arthritis    hands   Asthma    childhood-current   CAP (community acquired pneumonia) 04/07/2019   Carpal tunnel syndrome    had bilateral sx to tx   Cataract    bilateral--no sx yet   COPD (chronic obstructive pulmonary disease) ([place])    Depression    on meds   Diverticulitis    Emphysema of lung ([place])    uses MDI   Essential hypertension, benign 07/14/2019   on meds   Hyperlipidemia    on meds   Oxygen deficiency    home O2 at 2.5L/[place] nightly   Oxygen dependent    uses O2 at 2.5L/[place] nightly    Past Surgical History:  Procedure Laterality Date   ABDOMINAL HYSTERECTOMY  2009   APPENDECTOMY     ARTHROSCOPIC REPAIR ACL Left 2011/2014   x 2   BIOPSY  01/21/2020   Procedure: BIOPSY;  Surgeon: Mauri Pole, MD;  Location: [facility];  Service: Endoscopy;;  EGD and COLON   BUNIONECTOMY  2006   both   CARPAL TUNNEL RELEASE Bilateral 10/28/2013   Procedure: BILATERAL CARPAL TUNNEL RELEASE;  Surgeon: Wynonia Sours, MD;  Location: [facility];  Service: Orthopedics;  Laterality: Bilateral;   CHOLECYSTECTOMY  1994   CHONDROPLASTY Right 08/31/2020   Procedure: CHONDROPLASTY;  Surgeon: Earlie Server, MD;  Location: [facility];  Service: Orthopedics;  Laterality: Right;   COLONOSCOPY WITH PROPOFOL  N/A 01/21/2020   Procedure: COLONOSCOPY WITH PROPOFOL;  Surgeon: Mauri Pole, MD;  Location: [facility];  Service: Endoscopy;  Laterality: N/A;   DILATION AND CURETTAGE OF UTERUS     ESOPHAGOGASTRODUODENOSCOPY (EGD) WITH PROPOFOL N/A 01/21/2020   Procedure: ESOPHAGOGASTRODUODENOSCOPY (EGD) WITH PROPOFOL;  Surgeon: Mauri Pole, MD;  Location: [facility];  Service: Endoscopy;  Laterality: N/A;   KNEE ARTHROSCOPY WITH ANTERIOR CRUCIATE LIGAMENT (ACL) REPAIR Right 08/31/2020   Procedure: RIGHT KNEE  ARTHROSCOPY WITH ANTERIOR CRUCIATE LIGAMENT (ACL) REPAIR MEDIAL AND LATERAL  MENISECTOMY;  Surgeon: Earlie Server, MD;  Location: [place];  Service: Orthopedics;  Laterality: Right;  FEMORAL NERVE BLOCK   POLYPECTOMY  01/21/2020   Procedure: POLYPECTOMY;  Surgeon: Mauri Pole, MD;  Location: [facility];  Service: Endoscopy;;   TUBAL LIGATION  2004    Social History  reports that she has been smoking cigarettes. She has a 34.00 pack-year smoking history. She has never used smokeless tobacco. She reports current alcohol use. She reports that she does not use drugs.  Allergies  Allergen Reactions   Jasmine Oil Shortness Of Breath and Swelling   Levaquin [Levofloxacin] Shortness Of Breath, Itching, Swelling and Rash   Penicillins Anaphylaxis and Hives    Tolerated ceftriaxone   Aleve [Naproxen Sodium] Itching   Ceftin [Cefuroxime Axetil] Other (See Comments)    unsure   Chlorhexidine    Depo-Provera [Medroxyprogesterone Acetate] Other (See Comments)    Severe acne   Doxycycline Other (See Comments)    Raises blood pressure    Oxycodone Nausea And Vomiting   Tramadol Other (See Comments)    Kept her up all night    Family History  Problem Relation Age of Onset   Brain cancer Mother 76   Lung cancer Mother 22   Breast cancer Sister 28   Ovarian cancer Sister 38   Colon cancer Neg Hx    Colon polyps Neg Hx    Esophageal cancer Neg Hx    Rectal cancer Neg Hx    Stomach cancer Neg Hx   Reviewed on admission   Prior to Admission medications   Medication Sig Start Date End Date Taking? Authorizing Provider  acetaminophen (TYLENOL) 500 MG tablet Take 1,000 mg by mouth every 6 (six) hours as needed for moderate pain.    [provider]  albuterol (VENTOLIN HFA) 108 (90 Base) MCG/ACT inhaler INHALE 2 PUFFS INTO THE LUNGS TWICE A DAY 11/20/21   Maximiano Coss, NP  ALPRAZolam Duanne Moron) 1 MG tablet TAKE 1 TABLET BY MOUTH THREE TIMES A DAY AS NEEDED FOR  ANXIETY 04/03/22   Wendie Agreste, MD  amLODipine (NORVASC) 5 MG tablet Take 1 tablet (5 mg total) by mouth daily. 07/07/21   Maximiano Coss, NP  ASPIRIN LOW DOSE 81 MG EC tablet TAKE 1 TABLET BY MOUTH 2 TIMES DAILY. Patient taking differently: Take 81 mg by mouth in the morning and at bedtime. 03/21/21   Maximiano Coss, NP  azelastine (ASTELIN) 0.1 % nasal spray Place 1 spray into both nostrils 2 (two) times daily. Use in each nostril as directed 07/07/21   Maximiano Coss, NP  cetirizine (ZYRTEC) 10 MG tablet Take 1 tablet (10 mg total) by mouth daily. 07/07/21   Maximiano Coss, NP  citalopram (CELEXA) 20 MG tablet Take 60 mg daily 02/01/22   Midge Minium, MD  dapsone 25 MG tablet TAKE 1 TABLET BY MOUTH DAILY FOR 14 DAYS, THEN 2 TABLETS DAILY. 03/02/21   Orland Mustard,  Richard, NP  EPINEPHrine (EPIPEN 2-PAK) 0.3 mg/0.3 mL IJ SOAJ injection Inject 0.3 mg into the muscle as needed for anaphylaxis. 12/27/20   Maximiano Coss, NP  famotidine (PEPCID) 20 MG tablet TAKE 1 TABLET BY MOUTH TWICE A DAY (MORNING AND NIGHT) Patient taking differently: Take 20 mg by mouth 2 (two) times daily at 8 am and 10 pm. 11/07/20   Valentina Shaggy, MD  fexofenadine (ALLEGRA) 180 MG tablet Take 1 tablet (180 mg total) by mouth daily. 10/21/20   Florencia Reasons, MD  gabapentin (NEURONTIN) 300 MG capsule Take 1 capsule (300 mg total) by mouth 2 (two) times daily. 07/07/21   Maximiano Coss, NP  hydrOXYzine (ATARAX) 25 MG tablet TAKE 0.5-1 TABLETS (12.5-25 MG TOTAL) BY MOUTH 3 (THREE) TIMES DAILY AS NEEDED FOR ANXIETY. 09/18/21   Maximiano Coss, NP  ipratropium-albuterol (DUONEB) 0.5-2.5 (3) MG/3ML SOLN Use 1 vial by nebulization in the morning and at bedtime. 10/10/21   Maximiano Coss, NP  Magnesium Citrate 100 MG TABS Take 1 tablet by mouth at bedtime. 10/10/21   Maximiano Coss, NP  meclizine (ANTIVERT) 25 MG tablet Take 1 tablet (25 mg total) by mouth 3 (three) times daily as needed for dizziness. 12/27/20   Maximiano Coss, NP   Menthol, Topical Analgesic, (BIOFREEZE EX) Apply 1 application topically daily as needed (back pain).    [provider]  montelukast (SINGULAIR) 10 MG tablet Take 1 tablet (10 mg total) by mouth at bedtime. 07/07/21   Maximiano Coss, NP  nicotine (NICODERM CQ - DOSED IN MG/24 HOURS) 14 mg/24hr patch Place 1 patch (14 mg total) onto the skin daily. 07/10/21   Maximiano Coss, NP  nicotine (NICODERM CQ - DOSED IN MG/24 HOURS) 21 mg/24hr patch PLACE 1 PATCH ONTO THE SKIN DAILY. 08/24/21   Maximiano Coss, NP  nicotine (NICODERM CQ - DOSED IN MG/24 HR) 7 mg/24hr patch Place 1 patch (7 mg total) onto the skin daily. 07/10/21   Maximiano Coss, NP  omeprazole (PRILOSEC) 20 MG capsule TAKE 1 CAPSULE BY MOUTH TWICE A DAY 07/10/21   Maximiano Coss, NP  ondansetron (ZOFRAN) 8 MG tablet Take 1 tablet (8 mg total) by mouth every 8 (eight) hours as needed for nausea or vomiting. 12/06/21   Maximiano Coss, NP  pantoprazole (PROTONIX) 40 MG tablet TAKE 1 TABLET BY MOUTH EVERY DAY 05/12/21   Maximiano Coss, NP  potassium chloride SA (KLOR-CON M) 20 MEQ tablet Take 1 tablet (20 mEq total) by mouth daily. 10/10/21   Maximiano Coss, NP  prazosin (MINIPRESS) 1 MG capsule TAKE 1 TO 2 CAPSULES (1 TO 2 MG TOTAL) BY MOUTH AT BEDTIME. Patient taking differently: Take 1-2 mg by mouth at bedtime. 12/27/20   Maximiano Coss, NP  promethazine (PHENERGAN) 25 MG tablet Take 1 tablet (25 mg total) by mouth every 8 (eight) hours as needed. 12/06/21   Maximiano Coss, NP  rizatriptan (MAXALT) 10 MG tablet TAKE 1 TABLET BY MOUTH AS NEEDED FOR MIGRAINE. MAY REPEAT IN 2 HOURS IF NEEDED 10/17/21   Maximiano Coss, NP  rOPINIRole (REQUIP) 2 MG tablet Take 0.5-1 tablets (1-2 mg total) by mouth at bedtime. 10/10/21   Maximiano Coss, NP  STIOLTO RESPIMAT 2.5-2.5 MCG/ACT AERS Inhale 1 puff into the lungs daily. 11/20/21   Maximiano Coss, NP  traZODone (DESYREL) 50 MG tablet Take 1 tablet (50 mg total) by mouth at bedtime. 07/07/21    Maximiano Coss, NP   Physical Exam: Vitals:   04/05/22 1326 04/05/22 1328  BP: 109/79  Pulse: (!) 124   Resp: (!) 24   Temp: 99.4 F (37.4 C)   TempSrc: Oral   SpO2: 92%   Weight:  68 kg  Height:  _0  (1.6 m)   Physical Exam Constitutional:      General: She is not in acute distress.    Appearance: Normal appearance.  HENT:     Head: Normocephalic and atraumatic.     Mouth/Throat:     Mouth: Mucous membranes are moist.     Pharynx: Oropharynx is clear.  Eyes:     Extraocular Movements: Extraocular movements intact.     Pupils: Pupils are equal, round, and reactive to light.  Cardiovascular:     Rate and Rhythm: Normal rate and regular rhythm.     Pulses: Normal pulses.     Heart sounds: Normal heart sounds.  Pulmonary:     Effort: Pulmonary effort is normal. No respiratory distress.     Breath sounds: Wheezing and rhonchi present.  Abdominal:     General: Bowel sounds are normal. There is no distension.     Palpations: Abdomen is soft.     Tenderness: There is no abdominal tenderness.  Musculoskeletal:        General: No swelling or deformity.  Skin:    General: Skin is warm and dry.  Neurological:     General: No focal deficit present.     Mental Status: Mental status is at baseline.    Labs on Admission: I have personally reviewed following labs and imaging studies  CBC: Recent Labs  Lab 04/05/22 1336 04/05/22 1353  WBC 14.1*  --   HGB 16.2* 17.3*  HCT 46.6* 51.0*  MCV 98.5  --   PLT 261  --     Basic Metabolic Panel: Recent Labs  Lab 04/05/22 1336 04/05/22 1353  NA 136 134*  K 3.2* 3.1*  CL 92*  --   CO2 29  --   GLUCOSE 126*  --   BUN <5*  --   CREATININE 0.74  --   CALCIUM 8.1*  --     GFR: Estimated Creatinine Clearance: 74.4 mL/min (by C-G formula based on SCr of 0.74 mg/dL).  Liver Function Tests: Recent Labs  Lab 04/05/22 1336  AST 30  ALT 17  ALKPHOS 146*  BILITOT 0.9  PROT 6.8  ALBUMIN 3.0*    Urine analysis:     Component Value Date/Time   COLORURINE YELLOW 04/26/2021 0240   APPEARANCEUR CLEAR 04/26/2021 0240   APPEARANCEUR Cloudy (A) 04/23/2019 1141   LABSPEC >1.030 (H) 04/26/2021 0240   PHURINE 5.5 04/26/2021 0240   GLUCOSEU NEGATIVE 04/26/2021 0240   GLUCOSEU NEGATIVE 08/18/2020 1346   HGBUR NEGATIVE 04/26/2021 0240   BILIRUBINUR MODERATE (A) 04/26/2021 0240   BILIRUBINUR negative 07/14/2019 1342   BILIRUBINUR Negative 04/23/2019 1141   KETONESUR 15 (A) 04/26/2021 0240   PROTEINUR NEGATIVE 04/26/2021 0240   UROBILINOGEN 0.2 08/18/2020 1346   NITRITE NEGATIVE 04/26/2021 0240   LEUKOCYTESUR NEGATIVE 04/26/2021 0240    Radiological Exams on Admission: DG Chest 1 View  Result Date: 04/05/2022 CLINICAL DATA:  A [age]-year-old female presents for evaluation of cough and shortness of breath and nausea. EXAM: CHEST  1 VIEW COMPARISON:  May 01, 2021. FINDINGS: EKG leads project over the chest. Trachea midline. Cardiomediastinal contours and hilar structures are normal. Patchy basilar opacities greatest on the LEFT in the mid and lower chest but also present on the RIGHT in the mid and lower chest.  No lobar level consolidative changes. No pneumothorax. No gross effusion. Skeletal structures are unremarkable to the extent evaluated on limited evaluation. IMPRESSION: Patchy basilar opacities greatest on the LEFT in the mid and lower chest but also present on the RIGHT in the mid and lower chest. Findings are suspicious for multifocal infection. Would also include the possibility of COVID-19. Electronically Signed   By: Zetta Bills M.D.   On: 04/05/2022 14:33    EKG: Independently reviewed.  Sinus tachycardia 121 bpm.  Some baseline artifact.  Some low voltage in aVL.  Otherwise normal.  Assessment/Plan Principal Problem:   COPD exacerbation (HCC) Active Problems:   Depression   Insomnia   Hyperlipidemia   RLS (restless legs syndrome)   Essential hypertension, benign   Generalized anxiety  disorder   COPD exacerbation Chronic respiratory failure Pneumonia > Patient presenting with 5 days of cough and congestion with shortness of breath including dyspnea on exertion.  Had had lower saturations on her chronic 5 L at home: Typically in the high 80s to low 90s and recently saturating in the high 70s.  She tried her home inhalers without improvement. > Also reporting some chest wall pain associated with her coughing has become worse. > Workup in the ED included chest x-ray with bilateral patchy opacities, leukocytosis of 14.1, borderline lactic acid at 2.0, negative troponin and BNP. > Has received ceftriaxone, azithromycin, Solu-Medrol, DuoNebs, magnesium, IV fluids in the ED with some improvement. > Is now saturating well on her home 5 L. - Monitor on telemetry - Continue supplemental oxygen - Continue with ceftriaxone azithromycin - Continue with daily steroids - Continue scheduled Atrovent and as needed albuterol - As needed Toradol for chest wall pain  Hypokalemia > Did have potassium of 3.2 in ED.  Has received IV magnesium as above. - 40 mEq p.o. potassium - Trend renal function and electrolytes  Depression Anxiety Insomnia -Continue home trazodone, hydroxyzine, Xanax, prazosin  RLS - Continue home ropinirole and gabapentin  Hypertension > Blood pressure low normal in the ED.  States she has had decreased p.o. intake in the setting of her nausea vomiting cough. - Holding home amlodipine - Continue with IV fluids and give IV fluid bolus  DVT prophylaxis: Lovenox Code Status:   Full Family Communication:  Updated at bedside  Disposition Plan:   Patient is from:  Home  Anticipated DC to:  Home  Anticipated DC date:  1 to 3 days  Anticipated DC barriers: None  Consults called:  None Admission status:  The patient, telemetry  Severity of Illness: The appropriate patient status for this patient is OBSERVATION. Observation status is judged to be reasonable and  necessary in order to provide the required intensity of service to ensure the patient's safety. The patient's presenting symptoms, physical exam findings, and initial radiographic and laboratory data in the context of their medical condition is felt to place them at decreased risk for further clinical deterioration. Furthermore, it is anticipated that the patient will be medically stable for discharge from the hospital within 2 midnights of admission.    Marcelyn Bruins MD [facility]  How to contact the [facility] Attending or Consulting provider [place] or covering provider during after hours [place], for this patient?   Check the care team in [facility] and look for a) attending/consulting [facility] provider listed and b) the [facility] team listed Log into [email or web address] and use [place]'s universal password to access. If you do not have the password, please  contact the hospital operator. Locate the [facility] provider you are looking for under [facility] and page to a number that you can be directly reached. If you still have difficulty reaching the provider, please page the [facility] (Director on Call) for the Hospitalists listed on amion for assistance.  04/05/2022, 4:23 PM

## 2022-04-06 ENCOUNTER — Inpatient Hospital Stay (HOSPITAL_COMMUNITY): Payer: Commercial Managed Care - HMO

## 2022-04-06 DIAGNOSIS — Z9981 Dependence on supplemental oxygen: Secondary | ICD-10-CM | POA: Diagnosis not present

## 2022-04-06 DIAGNOSIS — J441 Chronic obstructive pulmonary disease with (acute) exacerbation: Secondary | ICD-10-CM | POA: Diagnosis present

## 2022-04-06 DIAGNOSIS — Z886 Allergy status to analgesic agent status: Secondary | ICD-10-CM | POA: Diagnosis not present

## 2022-04-06 DIAGNOSIS — Z881 Allergy status to other antibiotic agents status: Secondary | ICD-10-CM | POA: Diagnosis not present

## 2022-04-06 DIAGNOSIS — S2241XA Multiple fractures of ribs, right side, initial encounter for closed fracture: Secondary | ICD-10-CM | POA: Diagnosis present

## 2022-04-06 DIAGNOSIS — J188 Other pneumonia, unspecified organism: Secondary | ICD-10-CM | POA: Diagnosis present

## 2022-04-06 DIAGNOSIS — W010XXA Fall on same level from slipping, tripping and stumbling without subsequent striking against object, initial encounter: Secondary | ICD-10-CM | POA: Diagnosis present

## 2022-04-06 DIAGNOSIS — E876 Hypokalemia: Secondary | ICD-10-CM | POA: Diagnosis present

## 2022-04-06 DIAGNOSIS — J9611 Chronic respiratory failure with hypoxia: Secondary | ICD-10-CM | POA: Diagnosis not present

## 2022-04-06 DIAGNOSIS — E785 Hyperlipidemia, unspecified: Secondary | ICD-10-CM | POA: Diagnosis present

## 2022-04-06 DIAGNOSIS — F32A Depression, unspecified: Secondary | ICD-10-CM | POA: Diagnosis present

## 2022-04-06 DIAGNOSIS — F17210 Nicotine dependence, cigarettes, uncomplicated: Secondary | ICD-10-CM | POA: Diagnosis present

## 2022-04-06 DIAGNOSIS — Z7982 Long term (current) use of aspirin: Secondary | ICD-10-CM | POA: Diagnosis not present

## 2022-04-06 DIAGNOSIS — Z1152 Encounter for screening for COVID-19: Secondary | ICD-10-CM | POA: Diagnosis not present

## 2022-04-06 DIAGNOSIS — Z880 Allergy status to penicillin: Secondary | ICD-10-CM | POA: Diagnosis not present

## 2022-04-06 DIAGNOSIS — J189 Pneumonia, unspecified organism: Secondary | ICD-10-CM | POA: Diagnosis present

## 2022-04-06 DIAGNOSIS — F411 Generalized anxiety disorder: Secondary | ICD-10-CM | POA: Diagnosis present

## 2022-04-06 DIAGNOSIS — Y92010 Kitchen of single-family (private) house as the place of occurrence of the external cause: Secondary | ICD-10-CM | POA: Diagnosis not present

## 2022-04-06 DIAGNOSIS — Z79899 Other long term (current) drug therapy: Secondary | ICD-10-CM | POA: Diagnosis not present

## 2022-04-06 DIAGNOSIS — Z885 Allergy status to narcotic agent status: Secondary | ICD-10-CM | POA: Diagnosis not present

## 2022-04-06 DIAGNOSIS — G2581 Restless legs syndrome: Secondary | ICD-10-CM | POA: Diagnosis present

## 2022-04-06 DIAGNOSIS — J9621 Acute and chronic respiratory failure with hypoxia: Secondary | ICD-10-CM | POA: Diagnosis not present

## 2022-04-06 DIAGNOSIS — J440 Chronic obstructive pulmonary disease with acute lower respiratory infection: Secondary | ICD-10-CM | POA: Diagnosis present

## 2022-04-06 DIAGNOSIS — R0602 Shortness of breath: Secondary | ICD-10-CM | POA: Diagnosis present

## 2022-04-06 DIAGNOSIS — Z888 Allergy status to other drugs, medicaments and biological substances status: Secondary | ICD-10-CM | POA: Diagnosis not present

## 2022-04-06 DIAGNOSIS — G4700 Insomnia, unspecified: Secondary | ICD-10-CM | POA: Diagnosis present

## 2022-04-06 DIAGNOSIS — I10 Essential (primary) hypertension: Secondary | ICD-10-CM | POA: Diagnosis present

## 2022-04-06 LAB — COMPREHENSIVE METABOLIC PANEL
ALT: 14 U/L (ref 0–44)
AST: 21 U/L (ref 15–41)
Albumin: 2.4 g/dL — ABNORMAL LOW (ref 3.5–5.0)
Alkaline Phosphatase: 122 U/L (ref 38–126)
Anion gap: 15 (ref 5–15)
BUN: 15 mg/dL (ref 6–20)
CO2: 29 mmol/L (ref 22–32)
Calcium: 8.1 mg/dL — ABNORMAL LOW (ref 8.9–10.3)
Chloride: 93 mmol/L — ABNORMAL LOW (ref 98–111)
Creatinine, Ser: 1.23 mg/dL — ABNORMAL HIGH (ref 0.44–1.00)
GFR, Estimated: 52 mL/min — ABNORMAL LOW (ref >60)
Glucose, Bld: 210 mg/dL — ABNORMAL HIGH (ref 70–99)
Potassium: 3.6 mmol/L (ref 3.5–5.1)
Sodium: 137 mmol/L (ref 135–145)
Total Bilirubin: 0.2 mg/dL — ABNORMAL LOW (ref 0.3–1.2)
Total Protein: 5.7 g/dL — ABNORMAL LOW (ref 6.5–8.1)

## 2022-04-06 LAB — RESPIRATORY PANEL BY PCR

## 2022-04-06 LAB — URINALYSIS, ROUTINE W REFLEX MICROSCOPIC
Bilirubin Urine: NEGATIVE
Glucose, UA: NEGATIVE mg/dL
Hgb urine dipstick: NEGATIVE
Ketones, ur: NEGATIVE mg/dL
Leukocytes,Ua: NEGATIVE
Nitrite: NEGATIVE
Protein, ur: 30 mg/dL — AB
Specific Gravity, Urine: 1.021 (ref 1.005–1.030)
pH: 5 (ref 5.0–8.0)

## 2022-04-06 LAB — CBC
HCT: 41.5 % (ref 36.0–46.0)
Hemoglobin: 13.9 g/dL (ref 12.0–15.0)
MCH: 33.1 pg (ref 26.0–34.0)
MCHC: 33.5 g/dL (ref 30.0–36.0)
MCV: 98.8 fL (ref 80.0–100.0)
Platelets: 223 10*3/uL (ref 150–400)
RBC: 4.2 MIL/uL (ref 3.87–5.11)
RDW: 13.6 % (ref 11.5–15.5)
WBC: 9.3 10*3/uL (ref 4.0–10.5)
nRBC: 0 % (ref 0.0–0.2)

## 2022-04-06 LAB — STREP PNEUMONIAE URINARY ANTIGEN: Strep Pneumo Urinary Antigen: NEGATIVE

## 2022-04-06 LAB — MRSA NEXT GEN BY PCR, NASAL: MRSA by PCR Next Gen: NOT DETECTED

## 2022-04-06 MED ORDER — IPRATROPIUM-ALBUTEROL 0.5-2.5 (3) MG/3ML IN SOLN: 3.0000 mL | RESPIRATORY_TRACT | Status: DC

## 2022-04-06 MED ORDER — ALBUTEROL SULFATE (2.5 MG/3ML) 0.083% IN NEBU
2.5000 mg | INHALATION_SOLUTION | Freq: Four times a day (QID) | RESPIRATORY_TRACT | Status: DC
  Administered 2022-04-06 – 2022-04-08 (×8): 2.5 mg via RESPIRATORY_TRACT
  Filled 2022-04-06 (×8): qty 3

## 2022-04-06 MED ORDER — ORAL CARE MOUTH RINSE: 15.0000 mL | OROMUCOSAL | Status: DC | PRN

## 2022-04-06 MED ORDER — ONDANSETRON HCL 4 MG PO TABS
4.0000 mg | ORAL_TABLET | Freq: Three times a day (TID) | ORAL | Status: DC | PRN
  Administered 2022-04-06 – 2022-04-07 (×2): 4 mg via ORAL
  Filled 2022-04-06 (×3): qty 1

## 2022-04-06 MED ORDER — BENZONATATE 100 MG PO CAPS
100.0000 mg | ORAL_CAPSULE | Freq: Three times a day (TID) | ORAL | Status: DC | PRN
  Administered 2022-04-06 – 2022-04-09 (×7): 100 mg via ORAL
  Filled 2022-04-06 (×7): qty 1

## 2022-04-06 MED ORDER — SODIUM CHLORIDE 0.9 % IV SOLN: INTRAVENOUS | Status: DC | PRN

## 2022-04-06 NOTE — Plan of Care (Signed)
  Problem: Education: Goal: Knowledge of General Education information will improve Description: Including pain rating scale, medication(s)/side effects and non-pharmacologic comfort measures Outcome: Progressing   Problem: Health Behavior/Discharge Planning: Goal: Ability to manage health-related needs will improve Outcome: Progressing   Problem: Clinical Measurements: Goal: Respiratory complications will improve Outcome: Progressing   Problem: Clinical Measurements: Goal: Cardiovascular complication will be avoided Outcome: Progressing   Problem: Coping: Goal: Level of anxiety will decrease Outcome: Progressing   Problem: Pain Managment: Goal: General experience of comfort will improve Outcome: Progressing   Problem: Safety: Goal: Ability to remain free from injury will improve Outcome: Progressing

## 2022-04-06 NOTE — Progress Notes (Signed)
PROGRESS NOTE    Vicki [name]  TOI:[id number] DOB: October 24, 1965 DOA: 04/05/2022 PCP: Pcp, No  Chief Complaint  Patient presents with   Shortness of Breath   Chest Pain    Brief Narrative:  Vicki [name] is a 56 y.o. female with medical history significant of chronic respiratory failure on 5 L of oxygen, COPD, asthma, depression, anxiety, insomnia, hyperlipidemia, RLS, hypertension, drug allergies presenting with cough and shortness of breath.   Patient has had 5 days of cough, congestion with increased shortness of breath.  Is on chronic 5 L at home and usually saturates in the high 80s to low 90s noted to be saturating in the upper 70s recently.  She tried her home inhalers without improvement.  Also reporting increased dyspnea on exertion, green-yellow sputum production, subjective fever, chest pain worse with cough.  Also reporting episodes of nausea and vomiting.   EMS was called due to worsening symptoms and transported patient to the ED.   She denies chills, abdominal pain, constipation, diarrhea, nausea, vomiting.   ED Course: Vital signs in the ED significant for initial tachycardia in the 120s improved to the 100s with initial treatments, respiratory rate in the teens to 20s, blood pressure in the 83J to 825 systolic, in the ED is saturating well on her home 5 L.  Lab workup included CMP with potassium 3.2, chloride 92, glucose 126, calcium 8.1, albumin 3.0, alk phos 146.  CBC with leukocytosis to 14.1, hemoglobin 616.2.  PT, PTT, INR pending.  Troponin normal with repeat pending.  Lactic acid borderline at 2 with repeat pending.  BNP normal.  Respiratory panel for flu and COVID-negative.  Urinalysis, urine culture, blood culture pending.  Chest x-ray showed patchy opacities left greater than right consistent with pneumonia.  Patient received ceftriaxone, azithromycin in the ED.  Also received Solu-Medrol, DuoNebs, magnesium, started on IV fluids.  Assessment & Plan:    Principal Problem:   COPD exacerbation ([place]) Active Problems:   Depression   Insomnia   CAP (community acquired pneumonia)   Hyperlipidemia   RLS (restless legs syndrome)   Essential hypertension, benign   Generalized anxiety disorder   Chronic respiratory failure ([facility])   Multifocal pneumonia  COPD exacerbation Chronic respiratory failure Pneumonia > Patient presenting with 5 days of cough and congestion with shortness of breath including dyspnea on exertion.  Had had lower saturations on her chronic 5 L at home: Typically in the high 80s to low 90s and recently saturating in the high 70s.  She tried her home inhalers without improvement. CXR with patchy bilateral opacities.  Concerning for multifocal pneumonia. Negative covid, influenza, and RVP MRSA PCR pending, urine strep, urine legionella, sputum cx Continue ceftriaxone, azithromycin Schedule and prn nebs.  Continue prednisone for COPD exacerbation Remains on home 5 L O2, but has significant SOB   Hypokalemia resolved   Depression Anxiety Insomnia -Continue home trazodone, hydroxyzine, Xanax, prazosin   RLS - Continue home ropinirole and gabapentin   Hypertension - BP ok off her home meds - Holding home amlodipine      DVT prophylaxis: lovenox Code Status: full Family Communication: none at bedside Disposition:   Status is: Inpatient Remains inpatient appropriate because: continued SOB, needs additional treatment for her multifocal pneumonia and COPD exacerbation   Consultants:  none  Procedures:  none  Antimicrobials:  Anti-infectives (From admission, onward)    Start     Dose/Rate Route Frequency Ordered Stop   04/05/22 1400  cefTRIAXone (ROCEPHIN) 2 g  in sodium chloride 0.9 % 100 mL IVPB        2 g 200 mL/hr over 30 Minutes Intravenous Every 24 hours 04/05/22 1353 04/10/22 1359   04/05/22 1400  azithromycin (ZITHROMAX) 500 mg in sodium chloride 0.9 % 250 mL IVPB        500 mg 250 mL/hr over  60 Minutes Intravenous Every 24 hours 04/05/22 1353 04/10/22 1359       Subjective: Feels generally poorly  Objective: Vitals:   04/06/22 0917 04/06/22 1014 04/06/22 1209 04/06/22 1512  BP:  116/79 110/79   Pulse: (!) 112 100 84 81  Resp: (!) _0 (!) 22  Temp:  (!) 97.3 F (36.3 C) (!) 97.3 F (36.3 C)   TempSrc:  Oral Oral   SpO2: 93% 91% 90% 94%  Weight:      Height:        Intake/Output Summary (Last 24 hours) at 04/06/2022 1559 Last data filed at 04/06/2022 1200 Gross per 24 hour  Intake 4599.84 ml  Output 100 ml  Net 4499.84 ml   Filed Weights   04/05/22 1328  Weight: 68 kg    Examination:  General exam: Appears calm and comfortable  Respiratory system: frequent coarse cough, diminished Cardiovascular system: RRR Gastrointestinal system: Abdomen is nondistended, soft and nontender. Central nervous system: Alert and oriented. No focal neurological deficits. Extremities: no LEE   Data Reviewed: I have personally reviewed following labs and imaging studies  CBC: Recent Labs  Lab 04/05/22 1336 04/05/22 1353 04/06/22 0144  WBC 14.1*  --  9.3  HGB 16.2* 17.3* 13.9  HCT 46.6* 51.0* 41.5  MCV 98.5  --  98.8  PLT 261  --  366    Basic Metabolic Panel: Recent Labs  Lab 04/05/22 1336 04/05/22 1353 04/06/22 0144  NA 136 134* 137  K 3.2* 3.1* 3.6  CL 92*  --  93*  CO2 29  --  29  GLUCOSE 126*  --  210*  BUN <5*  --  15  CREATININE 0.74  --  1.23*  CALCIUM 8.1*  --  8.1*    GFR: Estimated Creatinine Clearance: 48.4 mL/min (Rayvon Dakin) (by C-G formula based on SCr of 1.23 mg/dL (H)).  Liver Function Tests: Recent Labs  Lab 04/05/22 1336 04/06/22 0144  AST 30 21  ALT 17 14  ALKPHOS 146* 122  BILITOT 0.9 0.2*  PROT 6.8 5.7*  ALBUMIN 3.0* 2.4*    CBG: No results for input(s): "GLUCAP" in the last 168 hours.   Recent Results (from the past 240 hour(s))  Resp Panel by RT-PCR (Flu Cj Edgell&B, Covid) Anterior Nasal Swab     Status: None    Collection Time: 04/05/22  1:51 PM   Specimen: Anterior Nasal Swab  Result Value Ref Range Status   SARS Coronavirus 2 by RT PCR NEGATIVE NEGATIVE Final    Comment: (NOTE) SARS-CoV-2 target nucleic acids are NOT DETECTED.  The SARS-CoV-2 RNA is generally detectable in upper respiratory specimens during the acute phase of infection. The lowest concentration of SARS-CoV-2 viral copies this assay can detect is 138 copies/mL. Wylan Gentzler negative result does not preclude SARS-Cov-2 infection and should not be used as the sole basis for treatment or other patient management decisions. Khylen Riolo negative result may occur with  improper specimen collection/handling, submission of specimen other than nasopharyngeal swab, presence of viral mutation(s) within the areas targeted by this assay, and inadequate number of viral copies(<138 copies/mL). Deaisha Welborn negative result must be combined with  clinical observations, patient history, and epidemiological information. The expected result is Negative.  Fact Sheet for Patients:  EntrepreneurPulse.com.au  Fact Sheet for Healthcare Providers:  IncredibleEmployment.be  This test is no t yet approved or cleared by the [place] FDA and  has been authorized for detection and/or diagnosis of SARS-CoV-2 by FDA under an Emergency Use Authorization (EUA). This EUA will remain  in effect (meaning this test can be used) for the duration of the COVID-19 declaration under Section 564(b)(1) of the Act, 21 U.S.C.section 360bbb-3(b)(1), unless the authorization is terminated  or revoked sooner.       Influenza Floretta Petro by PCR NEGATIVE NEGATIVE Final   Influenza B by PCR NEGATIVE NEGATIVE Final    Comment: (NOTE) The Xpert Xpress SARS-CoV-2/FLU/RSV plus assay is intended as an aid in the diagnosis of influenza from Nasopharyngeal swab specimens and should not be used as Sindi Beckworth sole basis for treatment. Nasal washings and aspirates are unacceptable for  Xpert Xpress SARS-CoV-2/FLU/RSV testing.  Fact Sheet for Patients: EntrepreneurPulse.com.au  Fact Sheet for Healthcare Providers: IncredibleEmployment.be  This test is not yet approved or cleared by the [place] FDA and has been authorized for detection and/or diagnosis of SARS-CoV-2 by FDA under an Emergency Use Authorization (EUA). This EUA will remain in effect (meaning this test can be used) for the duration of the COVID-19 declaration under Section 564(b)(1) of the Act, 21 U.S.C. section 360bbb-3(b)(1), unless the authorization is terminated or revoked.  Performed at [facility], [place] [address], [place], [place] [address]   Blood Culture (routine x 2)     Status: None (Preliminary result)   Collection Time: 04/05/22  1:51 PM   Specimen: BLOOD  Result Value Ref Range Status   Specimen Description BLOOD LEFT ANTECUBITAL  Final   Special Requests   Final    BOTTLES DRAWN AEROBIC AND ANAEROBIC Blood Culture results may not be optimal due to an excessive volume of blood received in culture bottles   Culture   Final    NO GROWTH < 24 HOURS Performed at [facility], [place] [address], [place], [place] [address]    Report Status PENDING  Incomplete  Blood Culture (routine x 2)     Status: None (Preliminary result)   Collection Time: 04/05/22  1:56 PM   Specimen: BLOOD RIGHT FOREARM  Result Value Ref Range Status   Specimen Description BLOOD RIGHT FOREARM  Final   Special Requests   Final    BOTTLES DRAWN AEROBIC AND ANAEROBIC Blood Culture results may not be optimal due to an excessive volume of blood received in culture bottles   Culture   Final    NO GROWTH < 24 HOURS Performed at [facility], [place] [address], [place], [place] [address]    Report Status PENDING  Incomplete  Respiratory (~20 pathogens) panel by PCR     Status: None   Collection Time: 04/06/22  8:30 AM   Specimen: Nasopharyngeal Swab; Respiratory   Result Value Ref Range Status   Adenovirus NOT DETECTED NOT DETECTED Final   Coronavirus 229E NOT DETECTED NOT DETECTED Final    Comment: (NOTE) The Coronavirus on the Respiratory Panel, DOES NOT test for the novel  Coronavirus (2019 nCoV)    Coronavirus HKU1 NOT DETECTED NOT DETECTED Final   Coronavirus NL63 NOT DETECTED NOT DETECTED Final   Coronavirus OC43 NOT DETECTED NOT DETECTED Final   Metapneumovirus NOT DETECTED NOT DETECTED Final   Rhinovirus / Enterovirus NOT DETECTED  NOT DETECTED Final   Influenza A NOT DETECTED NOT DETECTED Final   Influenza B NOT DETECTED NOT DETECTED Final   Parainfluenza Virus 1 NOT DETECTED NOT DETECTED Final   Parainfluenza Virus 2 NOT DETECTED NOT DETECTED Final   Parainfluenza Virus 3 NOT DETECTED NOT DETECTED Final   Parainfluenza Virus 4 NOT DETECTED NOT DETECTED Final   Respiratory Syncytial Virus NOT DETECTED NOT DETECTED Final   Bordetella pertussis NOT DETECTED NOT DETECTED Final   Bordetella Parapertussis NOT DETECTED NOT DETECTED Final   Chlamydophila pneumoniae NOT DETECTED NOT DETECTED Final   Mycoplasma pneumoniae NOT DETECTED NOT DETECTED Final    Comment: Performed at [facility], [place] [address] [place], [place] [address]         Radiology Studies: DG Chest 1 View  Result Date: 04/05/2022 CLINICAL DATA:  A [age]-year-old female presents for evaluation of cough and shortness of breath and nausea. EXAM: CHEST  1 VIEW COMPARISON:  May 01, 2021. FINDINGS: EKG leads project over the chest. Trachea midline. Cardiomediastinal contours and hilar structures are normal. Patchy basilar opacities greatest on the LEFT in the mid and lower chest but also present on the RIGHT in the mid and lower chest. No lobar level consolidative changes. No pneumothorax. No gross effusion. Skeletal structures are unremarkable to the extent evaluated on limited evaluation. IMPRESSION: Patchy basilar opacities greatest on the LEFT in the mid and  lower chest but also present on the RIGHT in the mid and lower chest. Findings are suspicious for multifocal infection. Would also include the possibility of COVID-19. Electronically Signed   By: Zetta Bills M.D.   On: 04/05/2022 14:33        Scheduled Meds:  albuterol  2.5 mg Nebulization Q6H WA   arformoterol  15 mcg Nebulization BID   And   umeclidinium bromide  1 puff Inhalation Daily   enoxaparin (LOVENOX) injection  40 mg Subcutaneous Q24H   gabapentin  300 mg Oral BID   prazosin  1 mg Oral QHS   predniSONE  40 mg Oral Q breakfast   rOPINIRole  1 mg Oral QHS   sodium chloride flush  3 mL Intravenous Q12H   Continuous Infusions:  sodium chloride 10 mL/hr at 04/06/22 1400   azithromycin 500 mg (04/06/22 1500)   cefTRIAXone (ROCEPHIN)  IV 2 g (04/06/22 1402)     LOS: 0 days    Time spent: over 30 min    Fayrene Helper, MD [facility]   To contact the attending provider between 7A-7P or the covering provider during after hours 7P-7A, please log into the web site [email or web address] and access using universal Junior password for that web site. If you do not have the password, please call the hospital operator.  04/06/2022, 3:59 PM

## 2022-04-07 ENCOUNTER — Inpatient Hospital Stay (HOSPITAL_COMMUNITY): Payer: Commercial Managed Care - HMO

## 2022-04-07 DIAGNOSIS — J441 Chronic obstructive pulmonary disease with (acute) exacerbation: Secondary | ICD-10-CM | POA: Diagnosis not present

## 2022-04-07 LAB — CBC WITH DIFFERENTIAL/PLATELET
Abs Immature Granulocytes: 0.11 10*3/uL — ABNORMAL HIGH (ref 0.00–0.07)
Basophils Absolute: 0 10*3/uL (ref 0.0–0.1)
Basophils Relative: 0 %
Eosinophils Absolute: 0 10*3/uL (ref 0.0–0.5)
Eosinophils Relative: 0 %
HCT: 40.6 % (ref 36.0–46.0)
Hemoglobin: 13.6 g/dL (ref 12.0–15.0)
Immature Granulocytes: 1 %
Lymphocytes Relative: 10 %
Lymphs Abs: 1.4 10*3/uL (ref 0.7–4.0)
MCH: 34 pg (ref 26.0–34.0)
MCHC: 33.5 g/dL (ref 30.0–36.0)
MCV: 101.5 fL — ABNORMAL HIGH (ref 80.0–100.0)
Monocytes Absolute: 1.3 10*3/uL — ABNORMAL HIGH (ref 0.1–1.0)
Monocytes Relative: 9 %
Neutro Abs: 11.9 10*3/uL — ABNORMAL HIGH (ref 1.7–7.7)
Neutrophils Relative %: 80 %
Platelets: 250 10*3/uL (ref 150–400)
RBC: 4 MIL/uL (ref 3.87–5.11)
RDW: 13.8 % (ref 11.5–15.5)
WBC: 14.7 10*3/uL — ABNORMAL HIGH (ref 4.0–10.5)
nRBC: 0 % (ref 0.0–0.2)

## 2022-04-07 LAB — MAGNESIUM: Magnesium: 1.7 mg/dL (ref 1.7–2.4)

## 2022-04-07 LAB — COMPREHENSIVE METABOLIC PANEL
ALT: 13 U/L (ref 0–44)
AST: 16 U/L (ref 15–41)
Albumin: 2.4 g/dL — ABNORMAL LOW (ref 3.5–5.0)
Alkaline Phosphatase: 113 U/L (ref 38–126)
Anion gap: 11 (ref 5–15)
BUN: 25 mg/dL — ABNORMAL HIGH (ref 6–20)
CO2: 29 mmol/L (ref 22–32)
Calcium: 8 mg/dL — ABNORMAL LOW (ref 8.9–10.3)
Chloride: 97 mmol/L — ABNORMAL LOW (ref 98–111)
Creatinine, Ser: 0.94 mg/dL (ref 0.44–1.00)
GFR, Estimated: >60 mL/min (ref >60)
Glucose, Bld: 139 mg/dL — ABNORMAL HIGH (ref 70–99)
Potassium: 3.8 mmol/L (ref 3.5–5.1)
Sodium: 137 mmol/L (ref 135–145)
Total Bilirubin: 0.3 mg/dL (ref 0.3–1.2)
Total Protein: 5.5 g/dL — ABNORMAL LOW (ref 6.5–8.1)

## 2022-04-07 LAB — EXPECTORATED SPUTUM ASSESSMENT W GRAM STAIN, RFLX TO RESP C

## 2022-04-07 LAB — PHOSPHORUS: Phosphorus: 3.1 mg/dL (ref 2.5–4.6)

## 2022-04-07 MED ORDER — LIDOCAINE 5 % EX PTCH
1.0000 | MEDICATED_PATCH | CUTANEOUS | Status: DC
  Administered 2022-04-07 – 2022-04-08 (×2): 1 via TRANSDERMAL
  Filled 2022-04-07 (×2): qty 1

## 2022-04-07 MED ORDER — HYDROCOD POLI-CHLORPHE POLI ER 10-8 MG/5ML PO SUER
5.0000 mL | Freq: Every evening | ORAL | Status: DC | PRN
  Administered 2022-04-07: 5 mL via ORAL
  Filled 2022-04-07: qty 5

## 2022-04-07 MED ORDER — KETOROLAC TROMETHAMINE 15 MG/ML IJ SOLN
15.0000 mg | Freq: Four times a day (QID) | INTRAMUSCULAR | Status: AC
  Administered 2022-04-07 – 2022-04-08 (×3): 15 mg via INTRAVENOUS
  Filled 2022-04-07 (×4): qty 1

## 2022-04-07 MED ORDER — AZITHROMYCIN 250 MG PO TABS
500.0000 mg | ORAL_TABLET | Freq: Every day | ORAL | Status: DC
  Administered 2022-04-07 – 2022-04-08 (×2): 500 mg via ORAL
  Filled 2022-04-07 (×2): qty 2

## 2022-04-07 MED ORDER — ACETAMINOPHEN 325 MG PO TABS: 650.0000 mg | ORAL_TABLET | Freq: Four times a day (QID) | ORAL | Status: DC | PRN

## 2022-04-07 MED ORDER — ACETAMINOPHEN 500 MG PO TABS
1000.0000 mg | ORAL_TABLET | Freq: Three times a day (TID) | ORAL | Status: DC
  Administered 2022-04-07 – 2022-04-08 (×4): 1000 mg via ORAL
  Filled 2022-04-07 (×5): qty 2

## 2022-04-07 MED ORDER — HYDROCODONE-ACETAMINOPHEN 5-325 MG PO TABS: 1.0000 | ORAL_TABLET | Freq: Three times a day (TID) | ORAL | Status: DC | PRN

## 2022-04-07 NOTE — Plan of Care (Signed)
  Problem: Education: Goal: Knowledge of General Education information will improve Description: Including pain rating scale, medication(s)/side effects and non-pharmacologic comfort measures Outcome: Progressing   Problem: Health Behavior/Discharge Planning: Goal: Ability to manage health-related needs will improve Outcome: Progressing   Problem: Clinical Measurements: Goal: Respiratory complications will improve Outcome: Progressing   Problem: Clinical Measurements: Goal: Cardiovascular complication will be avoided Outcome: Progressing   Problem: Coping: Goal: Level of anxiety will decrease Outcome: Progressing   Problem: Pain Managment: Goal: General experience of comfort will improve Outcome: Progressing   Problem: Safety: Goal: Ability to remain free from injury will improve Outcome: Progressing   Problem: Skin Integrity: Goal: Risk for impaired skin integrity will decrease Outcome: Progressing

## 2022-04-07 NOTE — Progress Notes (Addendum)
PROGRESS NOTE    Vicki [name]  KGU:[id number] DOB: [birth year] DOA: 04/05/2022 PCP: Pcp, No  Chief Complaint  Patient presents with   Shortness of Breath   Chest Pain    Brief Narrative:  Vicki [name] is Vicki [name] 56 y.o. female with medical history significant of chronic respiratory failure on 5 L of oxygen, COPD, asthma, depression, anxiety, insomnia, hyperlipidemia, RLS, hypertension, drug allergies presenting with cough and shortness of breath.   She was admitted for Mialani Reicks COPD exacerbation and community acquired pneumonia.    Assessment & Plan:   Principal Problem:   COPD exacerbation ([place]) Active Problems:   Depression   Insomnia   CAP (community acquired pneumonia)   Hyperlipidemia   RLS (restless legs syndrome)   Essential hypertension, benign   Generalized anxiety disorder   Chronic respiratory failure (HCC)   Multifocal pneumonia  COPD exacerbation Chronic respiratory failure Pneumonia Patient presenting with 5 days of cough and congestion with shortness of breath including dyspnea on exertion.  Had had lower saturations on her chronic 5 L at home: Typically in the high 80s to low 90s and recently saturating in the high 70s.  She tried her home inhalers without improvement. CXR with patchy bilateral opacities.  Concerning for multifocal pneumonia. Negative covid, influenza, and RVP MRSA PCR negative, urine strep negative, urine legionella pending, sputum cx (rare yeast, few gram positive cocci in pairs) Continue ceftriaxone, azithromycin Schedule and prn nebs.  Continue prednisone for COPD exacerbation Remains on home 5 L O2, but has significant SOB   Right Rib Fractures Nondisplaced fx of lateral R 4th and 5th ribs Related to fall Pain control, IS, pulmonary toilet  Fall Slipped on area rug in kitchen PT/OT  Hypokalemia resolved   Depression Anxiety Insomnia -Continue home trazodone, hydroxyzine, Xanax, prazosin   RLS - Continue home  ropinirole and gabapentin   Hypertension - BP ok off her home meds - Holding home amlodipine      DVT prophylaxis: lovenox Code Status: full Family Communication: none at bedside Disposition:   Status is: Inpatient Remains inpatient appropriate because: continued SOB, needs additional treatment for her multifocal pneumonia and COPD exacerbation   Consultants:  none  Procedures:  none  Antimicrobials:  Anti-infectives (From admission, onward)    Start     Dose/Rate Route Frequency Ordered Stop   04/07/22 1435  azithromycin (ZITHROMAX) tablet 500 mg        500 mg Oral Daily 04/07/22 1433 04/10/22 1359   04/05/22 1400  cefTRIAXone (ROCEPHIN) 2 g in sodium chloride 0.9 % 100 mL IVPB        2 g 200 mL/hr over 30 Minutes Intravenous Every 24 hours 04/05/22 1353 04/10/22 1359   04/05/22 1400  azithromycin (ZITHROMAX) 500 mg in sodium chloride 0.9 % 250 mL IVPB  Status:  Discontinued        500 mg 250 mL/hr over 60 Minutes Intravenous Every 24 hours 04/05/22 1353 04/07/22 1433       Subjective: Still c/o pain, discomfort at ribs  Objective: Vitals:   04/07/22 0828 04/07/22 1227 04/07/22 1235 04/07/22 1427  BP: (!) 123/100 100/79 100/79   Pulse: (!) 118 98 (!) 105 84  Resp: (!) 24 20 (!) 23 (!) 22  Temp: (!) 97.1 F (36.2 C) (!) 97.5 F (36.4 C)    TempSrc: Axillary Oral    SpO2: 90% 94% 92% 93%  Weight:      Height:        Intake/Output Summary (  Last 24 hours) at 04/07/2022 1556 Last data filed at 04/07/2022 0800 Gross per 24 hour  Intake 1227.82 ml  Output 450 ml  Net 777.82 ml   Filed Weights   04/05/22 1328  Weight: 68 kg    Examination:  General: No acute distress. Cardiovascular: RRR Lungs: diminished, frequent cough, unable to take deep breath due to pain Abdomen: Soft, nontender, nondistended  Neurological: Alert and oriented 3. Moves all extremities 4 with equal strength. Cranial nerves II through XII grossly intact. Extremities: No  clubbing or cyanosis. No edema.   Data Reviewed: I have personally reviewed following labs and imaging studies  CBC: Recent Labs  Lab 04/05/22 1336 04/05/22 1353 04/06/22 0144 04/07/22 0152  WBC 14.1*  --  9.3 14.7*  NEUTROABS  --   --   --  11.9*  HGB 16.2* 17.3* 13.9 13.6  HCT 46.6* 51.0* 41.5 40.6  MCV 98.5  --  98.8 101.5*  PLT 261  --  223 701    Basic Metabolic Panel: Recent Labs  Lab 04/05/22 1336 04/05/22 1353 04/06/22 0144 04/07/22 0152  NA 136 134* 137 137  K 3.2* 3.1* 3.6 3.8  CL 92*  --  93* 97*  CO2 29  --  29 29  GLUCOSE 126*  --  210* 139*  BUN <5*  --  15 25*  CREATININE 0.74  --  1.23* 0.94  CALCIUM 8.1*  --  8.1* 8.0*  MG  --   --   --  1.7  PHOS  --   --   --  3.1    GFR: Estimated Creatinine Clearance: 63.3 mL/min (by C-G formula based on SCr of 0.94 mg/dL).  Liver Function Tests: Recent Labs  Lab 04/05/22 1336 04/06/22 0144 04/07/22 0152  AST '30 21 16  '$ ALT '17 14 13  '$ ALKPHOS 146* 122 113  BILITOT 0.9 0.2* 0.3  PROT 6.8 5.7* 5.5*  ALBUMIN 3.0* 2.4* 2.4*    CBG: No results for input(s): "GLUCAP" in the last 168 hours.   Recent Results (from the past 240 hour(s))  Resp Panel by RT-PCR (Flu Ade Stmarie&B, Covid) Anterior Nasal Swab     Status: None   Collection Time: 04/05/22  1:51 PM   Specimen: Anterior Nasal Swab  Result Value Ref Range Status   SARS Coronavirus 2 by RT PCR NEGATIVE NEGATIVE Final    Comment: (NOTE) SARS-CoV-2 target nucleic acids are NOT DETECTED.  The SARS-CoV-2 RNA is generally detectable in upper respiratory specimens during the acute phase of infection. The lowest concentration of SARS-CoV-2 viral copies this assay can detect is 138 copies/mL. Maryah Marinaro negative result does not preclude SARS-Cov-2 infection and should not be used as the sole basis for treatment or other patient management decisions. Britaney Espaillat negative result may occur with  improper specimen collection/handling, submission of specimen other than nasopharyngeal  swab, presence of viral mutation(s) within the areas targeted by this assay, and inadequate number of viral copies(<138 copies/mL). Lenor Provencher negative result must be combined with clinical observations, patient history, and epidemiological information. The expected result is Negative.  Fact Sheet for Patients:  EntrepreneurPulse.com.au  Fact Sheet for Healthcare Providers:  IncredibleEmployment.be  This test is no t yet approved or cleared by the [place] FDA and  has been authorized for detection and/or diagnosis of SARS-CoV-2 by FDA under an Emergency Use Authorization (EUA). This EUA will remain  in effect (meaning this test can be used) for the duration of the COVID-19 declaration under Section 564(b)(1) of  the Act, 21 U.S.C.section 360bbb-3(b)(1), unless the authorization is terminated  or revoked sooner.       Influenza Lawana Hartzell by PCR NEGATIVE NEGATIVE Final   Influenza B by PCR NEGATIVE NEGATIVE Final    Comment: (NOTE) The Xpert Xpress SARS-CoV-2/FLU/RSV plus assay is intended as an aid in the diagnosis of influenza from Nasopharyngeal swab specimens and should not be used as Genise Strack sole basis for treatment. Nasal washings and aspirates are unacceptable for Xpert Xpress SARS-CoV-2/FLU/RSV testing.  Fact Sheet for Patients: EntrepreneurPulse.com.au  Fact Sheet for Healthcare Providers: IncredibleEmployment.be  This test is not yet approved or cleared by the [place] FDA and has been authorized for detection and/or diagnosis of SARS-CoV-2 by FDA under an Emergency Use Authorization (EUA). This EUA will remain in effect (meaning this test can be used) for the duration of the COVID-19 declaration under Section 564(b)(1) of the Act, 21 U.S.C. section 360bbb-3(b)(1), unless the authorization is terminated or revoked.  Performed at [facility], [place] [address] [place], [place] [address]   Blood  Culture (routine x 2)     Status: None (Preliminary result)   Collection Time: 04/05/22  1:51 PM   Specimen: BLOOD  Result Value Ref Range Status   Specimen Description BLOOD LEFT ANTECUBITAL  Final   Special Requests   Final    BOTTLES DRAWN AEROBIC AND ANAEROBIC Blood Culture results may not be optimal due to an excessive volume of blood received in culture bottles   Culture   Final    NO GROWTH 2 DAYS Performed at [facility], [place] [address] [place], [place] [address]    Report Status PENDING  Incomplete  Urine Culture     Status: Abnormal (Preliminary result)   Collection Time: 04/05/22  1:51 PM   Specimen: In/Out Cath Urine  Result Value Ref Range Status   Specimen Description IN/OUT CATH URINE  Final   Special Requests NONE  Final   Culture (Clayten Allcock)  Final    30,000 COLONIES/mL ESCHERICHIA COLI SUSCEPTIBILITIES TO FOLLOW Performed at [facility], [place] [address], [place], [place] [address]    Report Status PENDING  Incomplete  Blood Culture (routine x 2)     Status: None (Preliminary result)   Collection Time: 04/05/22  1:56 PM   Specimen: BLOOD RIGHT FOREARM  Result Value Ref Range Status   Specimen Description BLOOD RIGHT FOREARM  Final   Special Requests   Final    BOTTLES DRAWN AEROBIC AND ANAEROBIC Blood Culture results may not be optimal due to an excessive volume of blood received in culture bottles   Culture   Final    NO GROWTH 2 DAYS Performed at [facility], [place] [address] [place], [place] [address]    Report Status PENDING  Incomplete  Respiratory (~20 pathogens) panel by PCR     Status: None   Collection Time: 04/06/22  8:30 AM   Specimen: Nasopharyngeal Swab; Respiratory  Result Value Ref Range Status   Adenovirus NOT DETECTED NOT DETECTED Final   Coronavirus 229E NOT DETECTED NOT DETECTED Final    Comment: (NOTE) The Coronavirus on the Respiratory Panel, DOES NOT test for the novel  Coronavirus (2019 nCoV)    Coronavirus HKU1 NOT  DETECTED NOT DETECTED Final   Coronavirus NL63 NOT DETECTED NOT DETECTED Final   Coronavirus OC43 NOT DETECTED NOT DETECTED Final   Metapneumovirus NOT DETECTED NOT DETECTED Final   Rhinovirus / Enterovirus NOT DETECTED NOT DETECTED Final  Influenza Imane Burrough NOT DETECTED NOT DETECTED Final   Influenza B NOT DETECTED NOT DETECTED Final   Parainfluenza Virus 1 NOT DETECTED NOT DETECTED Final   Parainfluenza Virus 2 NOT DETECTED NOT DETECTED Final   Parainfluenza Virus 3 NOT DETECTED NOT DETECTED Final   Parainfluenza Virus 4 NOT DETECTED NOT DETECTED Final   Respiratory Syncytial Virus NOT DETECTED NOT DETECTED Final   Bordetella pertussis NOT DETECTED NOT DETECTED Final   Bordetella Parapertussis NOT DETECTED NOT DETECTED Final   Chlamydophila pneumoniae NOT DETECTED NOT DETECTED Final   Mycoplasma pneumoniae NOT DETECTED NOT DETECTED Final    Comment: Performed at [facility], [place] [address] [place], [place] [address]  Expectorated Sputum Assessment w Gram Stain, Rflx to Resp Cult     Status: None (Preliminary result)   Collection Time: 04/06/22  4:50 PM   Specimen: Expectorated Sputum  Result Value Ref Range Status   Specimen Description EXPECTORATED SPUTUM  Final   Special Requests NONE  Final   Sputum evaluation   Final    THIS SPECIMEN IS ACCEPTABLE FOR SPUTUM CULTURE Performed at [facility], [place] [address] [place], [place] [address]    Report Status PENDING  Incomplete  Culture, Respiratory w Gram Stain     Status: None (Preliminary result)   Collection Time: 04/06/22  4:50 PM  Result Value Ref Range Status   Specimen Description EXPECTORATED SPUTUM  Final   Special Requests NONE Reflexed from [id number]  Final   Gram Stain   Final    MODERATE WBC PRESENT, PREDOMINANTLY PMN FEW GRAM POSITIVE COCCI IN PAIRS RARE YEAST    Culture   Final    TOO YOUNG TO READ Performed at [facility], [place] [address], [place], [place] [address]    Report Status PENDING   Incomplete  MRSA Next Gen by PCR, Nasal     Status: None   Collection Time: 04/06/22  4:52 PM   Specimen: Nasal Mucosa; Nasal Swab  Result Value Ref Range Status   MRSA by PCR Next Gen NOT DETECTED NOT DETECTED Final    Comment: (NOTE) The GeneXpert MRSA Assay (FDA approved for NASAL specimens only), is one component of Faith Branan comprehensive MRSA colonization surveillance program. It is not intended to diagnose MRSA infection nor to guide or monitor treatment for MRSA infections. Test performance is not FDA approved in patients less than 46 years old. Performed at [facility], [place] [address] [place],  [address]          Radiology Studies: DG Ribs Unilateral Right  Result Date: 04/07/2022 CLINICAL DATA:  Right-sided chest pain EXAM: RIGHT RIBS - 2 VIEW COMPARISON:  04/06/2022 FINDINGS: Nondisplaced fractures of the lateral right fourth and fifth ribs. No acute abnormality of the included lungs. IMPRESSION: Nondisplaced fractures of the lateral right fourth and fifth ribs. Electronically Signed   By: Delanna Ahmadi M.D.   On: 04/07/2022 15:48   DG CHEST PORT 1 VIEW  Result Date: 04/06/2022 CLINICAL DATA:  Cough EXAM: PORTABLE CHEST 1 VIEW COMPARISON:  04/05/2022 FINDINGS: Equivocal patchy opacities within the RIGHT LOWER lung noted and may represent pneumonia. Cardiomediastinal silhouette is unremarkable. No other airspace disease, pneumothorax or pleural effusion noted. IMPRESSION: Equivocal patchy RIGHT LOWER lung opacities which may represent pneumonia. Electronically Signed   By: Margarette Canada M.D.   On: 04/06/2022 17:08        Scheduled Meds:  albuterol  2.5 mg Nebulization Q6H WA  arformoterol  15 mcg Nebulization BID   And   umeclidinium bromide  1 puff Inhalation Daily   azithromycin  500 mg Oral Daily   enoxaparin (LOVENOX) injection  40 mg Subcutaneous Q24H   gabapentin  300 mg Oral BID   lidocaine  1 patch Transdermal Q24H   prazosin  1 mg Oral QHS    predniSONE  40 mg Oral Q breakfast   rOPINIRole  1 mg Oral QHS   sodium chloride flush  3 mL Intravenous Q12H   Continuous Infusions:  sodium chloride 10 mL/hr at 04/06/22 1932   cefTRIAXone (ROCEPHIN)  IV Stopped (04/06/22 1432)     LOS: 1 day    Time spent: over 30 min    Fayrene Helper, MD [facility]   To contact the attending provider between 7A-7P or the covering provider during after hours 7P-7A, please log into the web site [email or web address] and access using universal [place] password for that web site. If you do not have the password, please call the hospital operator.  04/07/2022, 3:56 PM

## 2022-04-07 NOTE — Evaluation (Signed)
Physical Therapy Evaluation Patient Details Name: Vicki [name] MRN: [id number] DOB: 08/02/1965 Today's Date: 04/07/2022  History of Present Illness  Pt is a 56 y/o female presenting 11/23 wi worsening cough and SOB due to COPD exacerbation.  PMH:  COPD, with Emphysema, asthma, carpal tunnel with release  Clinical Impression  Pt admitted with/for SOB/COPD exac.  Pt presently needing min guard at the most during gait with 6L of O2 via [place] and still dyspneic.Marland Kitchen  Pt currently limited functionally due to the problems listed below.  (see problems list.)  Pt will benefit from PT to maximize function and safety to be able to get home safely with available assist .        Recommendations for follow up therapy are one component of a multi-disciplinary discharge planning process, led by the attending physician.  Recommendations may be updated based on patient status, additional functional criteria and insurance authorization.  Follow Up Recommendations No PT follow up      Assistance Recommended at Discharge Intermittent Supervision/Assistance  Patient can return home with the following  Assist for transportation;Help with stairs or ramp for entrance;Assistance with cooking/housework;A little help with walking and/or transfers    Equipment Recommendations None recommended by PT  Recommendations for Other Services       Functional Status Assessment       Precautions / Restrictions Precautions Precautions: Fall Precaution Comments: 5 + L O2 [place],  watch O2      Mobility  Bed Mobility Overal bed mobility: Modified Independent                  Transfers Overall transfer level: Needs assistance Equipment used: Rolling walker (2 wheels), None Transfers: Sit to/from Stand Sit to Stand: Supervision           General transfer comment: cues for safety and hand placement    Ambulation/Gait Ambulation/Gait assistance: Min guard Gait Distance (Feet): 80 Feet Assistive  device: Rolling walker (2 wheels) Gait Pattern/deviations: Step-through pattern   Gait velocity interpretation: <1.8 ft/sec, indicate of risk for recurrent falls   General Gait Details: generally steady, slow, minimal use of the RW.  SpO2 on 6 L was maintained at 93% with HR in the mid to upper 110's.  pt mildly dyspneic and stated becoming a little dizzy.  Stairs            Wheelchair Mobility    Modified Rankin (Stroke Patients Only)       Balance Overall balance assessment: No apparent balance deficits (not formally assessed)                                           Pertinent Vitals/Pain Pain Assessment Pain Assessment: Faces Faces Pain Scale: No hurt Pain Intervention(s): Monitored during session    Home Living Family/patient expects to be discharged to:: Private residence Living Arrangements: Spouse/significant other;Children Available Help at Discharge: Family Type of Home: House Home Access: Stairs to enter Entrance Stairs-Rails: None Entrance Stairs-Number of Steps: 2   Home Layout: One level Home Equipment: Cane - single Barista (2 wheels)      Prior Function Prior Level of Function : Independent/Modified Independent;History of Falls (last six months) (more confined to the home, able to drive)             Mobility Comments: mod I with RW in home. ADLs Comments: modified independent, increased  time. Pt reports showers took a long time and left her feeling tired/winded. pt drives short distances. pt getting out for errands on limited basis now.     Hand Dominance   Dominant Hand: Left    Extremity/Trunk Assessment   Upper Extremity Assessment Upper Extremity Assessment: Overall WFL for tasks assessed    Lower Extremity Assessment Lower Extremity Assessment: Overall WFL for tasks assessed    Cervical / Trunk Assessment Cervical / Trunk Assessment: Kyphotic  Communication   Communication: No difficulties   Cognition Arousal/Alertness: Awake/alert Behavior During Therapy: WFL for tasks assessed/performed Overall Cognitive Status: Within Functional Limits for tasks assessed                                          General Comments General comments (skin integrity, edema, etc.): VSS on 6L [place] with slow rise in HR to ~ mid 110's    Exercises     Assessment/Plan    PT Assessment Patient needs continued PT services  PT Problem List Decreased activity tolerance;Decreased mobility;Cardiopulmonary status limiting activity       PT Treatment Interventions Gait training;Functional mobility training;Stair training;DME instruction;Therapeutic activities;Patient/family education    PT Goals (Current goals can be found in the Care Plan section)  Acute Rehab PT Goals Patient Stated Goal: I want to be able to get out more.  It's hard with the heavy O2 tanks.  I'd like to get something smaller. PT Goal Formulation: With patient Time For Goal Achievement: 04/20/22 Potential to Achieve Goals: Good    Frequency Min 3X/week     Co-evaluation               AM-PAC PT "6 Clicks" Mobility  Outcome Measure Help needed turning from your back to your side while in a flat bed without using bedrails?: None Help needed moving from lying on your back to sitting on the side of a flat bed without using bedrails?: None Help needed moving to and from a bed to a chair (including a wheelchair)?: A Little Help needed standing up from a chair using your arms (e.g., wheelchair or bedside chair)?: A Little Help needed to walk in hospital room?: A Little Help needed climbing 3-5 steps with a railing? : A Little 6 Click Score: 20    End of Session Equipment Utilized During Treatment: Oxygen Activity Tolerance: Patient limited by fatigue;Patient tolerated treatment well Patient left: in bed;with call bell/phone within reach;with bed alarm set Nurse Communication: Mobility status PT Visit  Diagnosis: Other abnormalities of gait and mobility (R26.89);Difficulty in walking, not elsewhere classified (R26.2)    Time: 3976-7341 PT Time Calculation (min) (ACUTE ONLY): 24 min   Charges:   PT Evaluation $PT Eval Moderate Complexity: 1 Mod PT Treatments $Gait Training: 8-22 mins        04/07/2022  Ginger Carne., PT Acute Rehabilitation Services [phone]  (office)  Tessie Fass Emani Morad 04/07/2022, 5:37 PM

## 2022-04-08 DIAGNOSIS — F32A Depression, unspecified: Secondary | ICD-10-CM | POA: Diagnosis not present

## 2022-04-08 DIAGNOSIS — J9611 Chronic respiratory failure with hypoxia: Secondary | ICD-10-CM | POA: Diagnosis not present

## 2022-04-08 DIAGNOSIS — J441 Chronic obstructive pulmonary disease with (acute) exacerbation: Secondary | ICD-10-CM | POA: Diagnosis not present

## 2022-04-08 DIAGNOSIS — J189 Pneumonia, unspecified organism: Secondary | ICD-10-CM | POA: Diagnosis not present

## 2022-04-08 LAB — BASIC METABOLIC PANEL
Anion gap: 10 (ref 5–15)
BUN: 25 mg/dL — ABNORMAL HIGH (ref 6–20)
CO2: 28 mmol/L (ref 22–32)
Calcium: 8.2 mg/dL — ABNORMAL LOW (ref 8.9–10.3)
Chloride: 100 mmol/L (ref 98–111)
Creatinine, Ser: 1 mg/dL (ref 0.44–1.00)
GFR, Estimated: >60 mL/min (ref >60)
Glucose, Bld: 104 mg/dL — ABNORMAL HIGH (ref 70–99)
Potassium: 3.8 mmol/L (ref 3.5–5.1)
Sodium: 138 mmol/L (ref 135–145)

## 2022-04-08 LAB — CBC
HCT: 42.3 % (ref 36.0–46.0)
Hemoglobin: 13.9 g/dL (ref 12.0–15.0)
MCH: 34.2 pg — ABNORMAL HIGH (ref 26.0–34.0)
MCHC: 32.9 g/dL (ref 30.0–36.0)
MCV: 103.9 fL — ABNORMAL HIGH (ref 80.0–100.0)
Platelets: 283 10*3/uL (ref 150–400)
RBC: 4.07 MIL/uL (ref 3.87–5.11)
RDW: 13.8 % (ref 11.5–15.5)
WBC: 10.2 10*3/uL (ref 4.0–10.5)
nRBC: 0 % (ref 0.0–0.2)

## 2022-04-08 LAB — MAGNESIUM: Magnesium: 1.5 mg/dL — ABNORMAL LOW (ref 1.7–2.4)

## 2022-04-08 LAB — URINE CULTURE: Culture: 30000 — AB

## 2022-04-08 LAB — PHOSPHORUS: Phosphorus: 3.2 mg/dL (ref 2.5–4.6)

## 2022-04-08 MED ORDER — MAGNESIUM SULFATE 2 GM/50ML IV SOLN
2.0000 g | Freq: Once | INTRAVENOUS | Status: AC
  Administered 2022-04-08: 2 g via INTRAVENOUS
  Filled 2022-04-08: qty 50

## 2022-04-08 MED ORDER — HYDROCOD POLI-CHLORPHE POLI ER 10-8 MG/5ML PO SUER
5.0000 mL | Freq: Two times a day (BID) | ORAL | Status: DC | PRN
  Administered 2022-04-08 (×2): 5 mL via ORAL
  Filled 2022-04-08 (×2): qty 5

## 2022-04-08 MED ORDER — KETOROLAC TROMETHAMINE 15 MG/ML IJ SOLN
15.0000 mg | Freq: Four times a day (QID) | INTRAMUSCULAR | Status: AC
  Administered 2022-04-08 – 2022-04-09 (×3): 15 mg via INTRAVENOUS
  Filled 2022-04-08 (×3): qty 1

## 2022-04-08 MED ORDER — ALBUTEROL SULFATE (2.5 MG/3ML) 0.083% IN NEBU
2.5000 mg | INHALATION_SOLUTION | Freq: Two times a day (BID) | RESPIRATORY_TRACT | Status: DC
  Administered 2022-04-08 – 2022-04-09 (×2): 2.5 mg via RESPIRATORY_TRACT
  Filled 2022-04-08 (×2): qty 3

## 2022-04-08 NOTE — Progress Notes (Signed)
Mobility Specialist Progress Note    04/08/22 1610  Mobility  Activity Ambulated with assistance in hallway  Level of Assistance Contact guard assist, steadying assist  Assistive Device Front wheel walker  Distance Ambulated (ft) 200 ft  Activity Response Tolerated well  Mobility Referral Yes  $Mobility charge 1 Mobility   Pre-Mobility: 91 HR, 96% SpO2 During Mobility: 104 HR Post-Mobility: 101 HR  Pt received in bed and agreeable. No complaints on walk. On 6LO2. Returned to sitting EOB with call bell in reach.    Hildred Alamin Mobility Specialist  Please Psychologist, sport and exercise or Rehab Office at [phone]

## 2022-04-08 NOTE — Progress Notes (Addendum)
[facility] PROGRESS NOTE  Vicki [name] (DOB: [birth year]) NUU:[id number] PCP: Pcp, No Outpatient Specialists: Pulmonary, Dr. Ander Slade.   Brief Narrative: Vicki [name] is a 56 y.o. female with a history of chronic 5L O2-dependent respiratory failure, COPD, asthma, HTN, HLD, depression, anxiety, insomnia, RLS who presented to the ED on 04/05/2022 with cough and shortness of breath, admitted for CAP and COPD exacerbation.   Subjective: Shortness of breath remains worse than baseline, limiting movement. Coughing is persistent, severe and largest contributor to dyspnea. No chest pain or fevers.   Objective: BP (!) 173/106 (BP Location: Left Arm)   Pulse (!) 101   Temp 97.9 F (36.6 C) (Axillary)   Resp 20   Ht '5\' 3"'$  (1.6 m)   Wt 68 kg   SpO2 92%   BMI 26.57 kg/m   Gen: 56yo F in no acute distress Pulm: Tachypneic, shallow with expiratory rhonchi, coarse more at right base.   CV: Regular tachycardia without MRG, pitting edema GI: Soft, NT, ND, +BS  Neuro: Alert and oriented. No new focal deficits. Ext: Warm, no deformities. Skin: No new rashes, lesions or ulcers on visualized skin   Assessment & Plan: Acute on chronic hypoxic respiratory failure: Degree of hypoxia is improving, though respiratory effort remains elevated from baseline.  - Continue treating causes as below - Continue supplemental oxygen to maintain SpO2 >88% and normalize WOB.   AECOPD due to RLL CAP: MRSA PCR negative, urine strep negative, urine legionella pending - Follow sputum Cx (gram stain showed rare yeast, GPC in pairs)  - Continue ceftriaxone, azithromycin (11/23 - 11/27) - Continue prednisone. Wheezing improved.  - Incentive spirometry - Augment antitussive regimen.  - Continue scheduled and prn nebs.     Nondisplaced right 4th and 5th rib fractures: Due to fall.  - PT evaluated, no follow up needed.  - Pain control w/toradol (Cr stable), IS  Depression, anxiety, insomnia, RLS:   - Continue home meds trazodone, hydroxyzine, xanax, prazosin, ropinirole, gabapentin.   HTN:  - Holding norvasc  Hypomagnesemia:  - Supplement today.  Hypokalemia: Resolved.   Patrecia Pour, MD [facility] [email or web address] 04/08/2022, 2:23 PM

## 2022-04-09 ENCOUNTER — Other Ambulatory Visit (HOSPITAL_COMMUNITY): Payer: Self-pay

## 2022-04-09 DIAGNOSIS — J189 Pneumonia, unspecified organism: Secondary | ICD-10-CM | POA: Diagnosis not present

## 2022-04-09 DIAGNOSIS — J441 Chronic obstructive pulmonary disease with (acute) exacerbation: Secondary | ICD-10-CM | POA: Diagnosis not present

## 2022-04-09 DIAGNOSIS — J9611 Chronic respiratory failure with hypoxia: Secondary | ICD-10-CM | POA: Diagnosis not present

## 2022-04-09 DIAGNOSIS — I10 Essential (primary) hypertension: Secondary | ICD-10-CM | POA: Diagnosis not present

## 2022-04-09 LAB — LEGIONELLA PNEUMOPHILA SEROGP 1 UR AG: L. pneumophila Serogp 1 Ur Ag: NEGATIVE

## 2022-04-09 LAB — CULTURE, RESPIRATORY W GRAM STAIN: Culture: NORMAL

## 2022-04-09 MED ORDER — HYDROCOD POLI-CHLORPHE POLI ER 10-8 MG/5ML PO SUER
5.0000 mL | Freq: Two times a day (BID) | ORAL | 0 refills | Status: DC | PRN
  Filled 2022-04-09: qty 70, 7d supply, fill #0

## 2022-04-09 MED ORDER — PREDNISONE 10 MG PO TABS
ORAL_TABLET | ORAL | 0 refills | Status: DC
  Filled 2022-04-09: qty 21, 9d supply, fill #0

## 2022-04-09 MED ORDER — AZITHROMYCIN 500 MG PO TABS
500.0000 mg | ORAL_TABLET | Freq: Every day | ORAL | 0 refills | Status: DC
  Filled 2022-04-09: qty 1, 1d supply, fill #0

## 2022-04-09 MED ORDER — IBUPROFEN 600 MG PO TABS: 600.0000 mg | ORAL_TABLET | Freq: Four times a day (QID) | ORAL | Status: DC | PRN

## 2022-04-09 NOTE — Discharge Summary (Signed)
Physician Discharge Summary   Patient: Vicki [name] MRN: [id number] DOB: 1965-10-18  Admit date:     04/05/2022  Discharge date: 04/09/22  Discharge Physician: Patrecia Pour   PCP: Pcp, No   Recommendations at discharge:   Follow up with PCP in the next 1-2 weeks.  Follow up with pulmonary after discharge from COPD exacerbation admission. Completing prednisone taper after discharge.  Discharge Diagnoses: Principal Problem:   COPD exacerbation ([place]) Active Problems:   Depression   Insomnia   CAP (community acquired pneumonia)   Hyperlipidemia   RLS (restless legs syndrome)   Essential hypertension, benign   Generalized anxiety disorder   Chronic respiratory failure ([place])   Multifocal pneumonia  Hospital Course: Vicki [name] is a 56 y.o. female with a history of chronic 5L O2-dependent respiratory failure, COPD, asthma, HTN, HLD, depression, anxiety, insomnia, RLS who presented to the ED on 04/05/2022 with cough and shortness of breath, admitted for CAP and COPD exacerbation. With antibiotics, steroids, and breathing treatments, she has improved near her baseline. She will continue nebulized therapies at home as well as steroid taper and complete antibiotics orally.   Assessment and Plan: Acute on chronic hypoxic respiratory failure: Degree of hypoxia is improving, remains short of breath but improvement has been sustained and she is functionally mobile to return home, agrees to discharging today, has assistance with husband. - No hypoxia on home O2 with exertion.   AECOPD due to RLL CAP: MRSA PCR negative, urine strep negative, urine legionella pending - Follow sputum Cx (gram stain showed rare yeast, GPC in pairs)  - Complete antibiotics course with azithromycin (received ceftriaxone, azithromycin (11/23 - 11/27)) - Continue prednisone with plans for taper '40mg'$  > '20mg'$  > '10mg'$  down every 3 days. Will follow up with PCP and pulmonary. - Incentive spirometry -  Prescribed tussionex after PDMP review, warned about respiratory depression particularly with benzo which has not been noted during admission. Continue mucinex as well.  - Continue scheduled and prn nebs.      Nondisplaced right 4th and 5th rib fractures: Due to fall.  - PT evaluated, no follow up needed.  - Pain control w/tylenol and ibuprofen. Continue IS at home.   Depression, anxiety, insomnia, RLS:  - Continue home meds trazodone, hydroxyzine, xanax, prazosin, ropinirole, gabapentin.    HTN:  - Continue home norvasc   Hypomagnesemia:  - Supplemented   Hypokalemia: Resolved.   Consultants: None Procedures performed: None  Disposition: Home Diet recommendation:  Cardiac diet DISCHARGE MEDICATION: Allergies as of 04/09/2022       Reactions   Jasmine Oil Shortness Of Breath, Swelling   Levaquin [levofloxacin] Shortness Of Breath, Itching, Swelling, Rash   Penicillins Anaphylaxis, Hives   Tolerated ceftriaxone   Aleve [naproxen Sodium] Itching   Ceftin [cefuroxime Axetil] Other (See Comments)   unsure   Chlorhexidine    Depo-provera [medroxyprogesterone Acetate] Other (See Comments)   Severe acne   Doxycycline Other (See Comments)   Raises blood pressure    Oxycodone Nausea And Vomiting   Tramadol Other (See Comments)   Kept her up all night        Medication List     STOP taking these medications    dapsone 25 MG tablet   pantoprazole 40 MG tablet Commonly known as: PROTONIX       TAKE these medications    acetaminophen 500 MG tablet Commonly known as: TYLENOL Take 1,000 mg by mouth every 6 (six) hours as needed for moderate  pain.   albuterol 108 (90 Base) MCG/ACT inhaler Commonly known as: VENTOLIN HFA INHALE 2 PUFFS INTO THE LUNGS TWICE A DAY What changed:  how much to take how to take this when to take this additional instructions   ALPRAZolam 1 MG tablet Commonly known as: XANAX TAKE 1 TABLET BY MOUTH THREE TIMES A DAY AS NEEDED FOR  ANXIETY What changed: See the new instructions.   amLODipine 5 MG tablet Commonly known as: NORVASC Take 1 tablet (5 mg total) by mouth daily.   Aspirin Low Dose 81 MG tablet Generic drug: aspirin EC TAKE 1 TABLET BY MOUTH 2 TIMES DAILY. What changed:  how much to take when to take this   azelastine 0.1 % nasal spray Commonly known as: ASTELIN Place 1 spray into both nostrils 2 (two) times daily. Use in each nostril as directed What changed:  when to take this additional instructions   azithromycin 500 MG tablet Commonly known as: ZITHROMAX Take 1 tablet (500 mg total) by mouth daily.   chlorpheniramine-HYDROcodone 10-8 MG/5ML Commonly known as: TUSSIONEX Take 5 mLs by mouth every 12 (twelve) hours as needed for cough.   citalopram 20 MG tablet Commonly known as: CELEXA Take 60 mg daily   EPINEPHrine 0.3 mg/0.3 mL Soaj injection Commonly known as: EpiPen 2-Pak Inject 0.3 mg into the muscle as needed for anaphylaxis.   famotidine 20 MG tablet Commonly known as: PEPCID TAKE 1 TABLET BY MOUTH TWICE A DAY (MORNING AND NIGHT) What changed: See the new instructions.   gabapentin 300 MG capsule Commonly known as: NEURONTIN Take 1 capsule (300 mg total) by mouth 2 (two) times daily.   hydrOXYzine 25 MG tablet Commonly known as: ATARAX TAKE 0.5-1 TABLETS (12.5-25 MG TOTAL) BY MOUTH 3 (THREE) TIMES DAILY AS NEEDED FOR ANXIETY.   ibuprofen 600 MG tablet Commonly known as: ADVIL Take 1 tablet (600 mg total) by mouth every 6 (six) hours as needed (severe pain).   ipratropium-albuterol 0.5-2.5 (3) MG/3ML Soln Commonly known as: DUONEB Use 1 vial by nebulization in the morning and at bedtime.   Magnesium Citrate 100 MG Tabs Take 1 tablet by mouth at bedtime. What changed: how much to take   meclizine 25 MG tablet Commonly known as: ANTIVERT Take 1 tablet (25 mg total) by mouth 3 (three) times daily as needed for dizziness.   montelukast 10 MG tablet Commonly known  as: SINGULAIR Take 1 tablet (10 mg total) by mouth at bedtime.   omeprazole 20 MG capsule Commonly known as: PRILOSEC TAKE 1 CAPSULE BY MOUTH TWICE A DAY What changed: when to take this   ondansetron 8 MG tablet Commonly known as: ZOFRAN Take 1 tablet (8 mg total) by mouth every 8 (eight) hours as needed for nausea or vomiting.   potassium chloride SA 20 MEQ tablet Commonly known as: KLOR-CON M Take 1 tablet (20 mEq total) by mouth daily.   prazosin 1 MG capsule Commonly known as: MINIPRESS TAKE 1 TO 2 CAPSULES (1 TO 2 MG TOTAL) BY MOUTH AT BEDTIME. What changed:  how much to take how to take this when to take this additional instructions   predniSONE 10 MG tablet Commonly known as: DELTASONE Take 4 tablets (40 mg total) by mouth daily with breakfast for 3 days, THEN 2 tablets (20 mg total) daily with breakfast for 3 days, THEN 1 tablet (10 mg total) daily with breakfast for 3 days. Start taking on: April 10, 2022   promethazine 25 MG tablet Commonly  known as: PHENERGAN Take 1 tablet (25 mg total) by mouth every 8 (eight) hours as needed. What changed: reasons to take this   rizatriptan 10 MG tablet Commonly known as: MAXALT TAKE 1 TABLET BY MOUTH AS NEEDED FOR MIGRAINE. MAY REPEAT IN 2 HOURS IF NEEDED What changed: See the new instructions.   rOPINIRole 2 MG tablet Commonly known as: REQUIP Take 0.5-1 tablets (1-2 mg total) by mouth at bedtime.   Stiolto Respimat 2.5-2.5 MCG/ACT Aers Generic drug: Tiotropium Bromide-Olodaterol Inhale 1 puff into the lungs daily.   traZODone 50 MG tablet Commonly known as: DESYREL Take 1 tablet (50 mg total) by mouth at bedtime.        Follow-up Information     Maximiano Coss, NP Follow up.   Specialty: Nurse Practitioner Contact information: [place] [address] [phone]         Laurin Coder, MD Follow up.   Specialty: Pulmonary Disease Contact information: [place]  [place] [address] [phone]                Discharge Exam: Danley Danker Weights   04/05/22 1328  Weight: 68 kg  BP (!) 146/100 (BP Location: Left Arm)   Pulse (!) 102   Temp 97.6 F (36.4 C) (Oral)   Resp 18   Ht '5\' 3"'$  (1.6 m)   Wt 68 kg   SpO2 98%   BMI 26.57 kg/m   No distress, coughing a fair amount. No central chest pain or pressure or wheezing. Clear, improved air exchange, nonlabored slightly tachypneic with longer sentences and getting up for exam.  Regular borderline tachycardia, no MRG or pitting edema  Condition at discharge: stable  The results of significant diagnostics from this hospitalization (including imaging, microbiology, ancillary and laboratory) are listed below for reference.   Imaging Studies: DG Ribs Unilateral Right  Result Date: 04/07/2022 CLINICAL DATA:  Right-sided chest pain EXAM: RIGHT RIBS - 2 VIEW COMPARISON:  04/06/2022 FINDINGS: Nondisplaced fractures of the lateral right fourth and fifth ribs. No acute abnormality of the included lungs. IMPRESSION: Nondisplaced fractures of the lateral right fourth and fifth ribs. Electronically Signed   By: Delanna Ahmadi M.D.   On: 04/07/2022 15:48   DG CHEST PORT 1 VIEW  Result Date: 04/06/2022 CLINICAL DATA:  Cough EXAM: PORTABLE CHEST 1 VIEW COMPARISON:  04/05/2022 FINDINGS: Equivocal patchy opacities within the RIGHT LOWER lung noted and may represent pneumonia. Cardiomediastinal silhouette is unremarkable. No other airspace disease, pneumothorax or pleural effusion noted. IMPRESSION: Equivocal patchy RIGHT LOWER lung opacities which may represent pneumonia. Electronically Signed   By: Margarette Canada M.D.   On: 04/06/2022 17:08   DG Chest 1 View  Result Date: 04/05/2022 CLINICAL DATA:  A [age]-year-old female presents for evaluation of cough and shortness of breath and nausea. EXAM: CHEST  1 VIEW COMPARISON:  May 01, 2021. FINDINGS: EKG leads project over the chest. Trachea midline.  Cardiomediastinal contours and hilar structures are normal. Patchy basilar opacities greatest on the LEFT in the mid and lower chest but also present on the RIGHT in the mid and lower chest. No lobar level consolidative changes. No pneumothorax. No gross effusion. Skeletal structures are unremarkable to the extent evaluated on limited evaluation. IMPRESSION: Patchy basilar opacities greatest on the LEFT in the mid and lower chest but also present on the RIGHT in the mid and lower chest. Findings are suspicious for multifocal infection. Would also include the possibility of COVID-19.  Electronically Signed   By: Zetta Bills M.D.   On: 04/05/2022 14:33    Microbiology: Results for orders placed or performed during the hospital encounter of 04/05/22  Resp Panel by RT-PCR (Flu A&B, Covid) Anterior Nasal Swab     Status: None   Collection Time: 04/05/22  1:51 PM   Specimen: Anterior Nasal Swab  Result Value Ref Range Status   SARS Coronavirus 2 by RT PCR NEGATIVE NEGATIVE Final    Comment: (NOTE) SARS-CoV-2 target nucleic acids are NOT DETECTED.  The SARS-CoV-2 RNA is generally detectable in upper respiratory specimens during the acute phase of infection. The lowest concentration of SARS-CoV-2 viral copies this assay can detect is 138 copies/mL. A negative result does not preclude SARS-Cov-2 infection and should not be used as the sole basis for treatment or other patient management decisions. A negative result may occur with  improper specimen collection/handling, submission of specimen other than nasopharyngeal swab, presence of viral mutation(s) within the areas targeted by this assay, and inadequate number of viral copies(<138 copies/mL). A negative result must be combined with clinical observations, patient history, and epidemiological information. The expected result is Negative.  Fact Sheet for Patients:  EntrepreneurPulse.com.au  Fact Sheet for Healthcare Providers:   IncredibleEmployment.be  This test is no t yet approved or cleared by the [place] FDA and  has been authorized for detection and/or diagnosis of SARS-CoV-2 by FDA under an Emergency Use Authorization (EUA). This EUA will remain  in effect (meaning this test can be used) for the duration of the COVID-19 declaration under Section 564(b)(1) of the Act, 21 U.S.C.section 360bbb-3(b)(1), unless the authorization is terminated  or revoked sooner.       Influenza A by PCR NEGATIVE NEGATIVE Final   Influenza B by PCR NEGATIVE NEGATIVE Final    Comment: (NOTE) The Xpert Xpress SARS-CoV-2/FLU/RSV plus assay is intended as an aid in the diagnosis of influenza from Nasopharyngeal swab specimens and should not be used as a sole basis for treatment. Nasal washings and aspirates are unacceptable for Xpert Xpress SARS-CoV-2/FLU/RSV testing.  Fact Sheet for Patients: EntrepreneurPulse.com.au  Fact Sheet for Healthcare Providers: IncredibleEmployment.be  This test is not yet approved or cleared by the [place] FDA and has been authorized for detection and/or diagnosis of SARS-CoV-2 by FDA under an Emergency Use Authorization (EUA). This EUA will remain in effect (meaning this test can be used) for the duration of the COVID-19 declaration under Section 564(b)(1) of the Act, 21 U.S.C. section 360bbb-3(b)(1), unless the authorization is terminated or revoked.  Performed at [facility], [place] [address] [place], [place] [address]   Blood Culture (routine x 2)     Status: None (Preliminary result)   Collection Time: 04/05/22  1:51 PM   Specimen: BLOOD  Result Value Ref Range Status   Specimen Description BLOOD LEFT ANTECUBITAL  Final   Special Requests   Final    BOTTLES DRAWN AEROBIC AND ANAEROBIC Blood Culture results may not be optimal due to an excessive volume of blood received in culture bottles   Culture   Final     NO GROWTH 4 DAYS Performed at [facility], [place] [address], [place], [place] [address]    Report Status PENDING  Incomplete  Urine Culture     Status: Abnormal   Collection Time: 04/05/22  1:51 PM   Specimen: In/Out Cath Urine  Result Value Ref Range Status   Specimen Description IN/OUT CATH URINE  Final  Special Requests   Final    NONE Performed at [facility], [place] [address] [place], [place] [address]    Culture (A)  Final    30,000 COLONIES/mL ESCHERICHIA COLI Confirmed Extended Spectrum Beta-Lactamase Producer (ESBL).  In bloodstream infections from ESBL organisms, carbapenems are preferred over piperacillin/tazobactam. They are shown to have a lower risk of mortality.    Report Status 04/08/2022 FINAL  Final   Organism ID, Bacteria ESCHERICHIA COLI (A)  Final      Susceptibility   Escherichia coli - MIC*    AMPICILLIN >=32 RESISTANT Resistant     CEFAZOLIN >=64 RESISTANT Resistant     CEFEPIME 16 RESISTANT Resistant     CEFTRIAXONE >=64 RESISTANT Resistant     CIPROFLOXACIN 0.5 INTERMEDIATE Intermediate     GENTAMICIN <=1 SENSITIVE Sensitive     IMIPENEM <=0.25 SENSITIVE Sensitive     NITROFURANTOIN <=16 SENSITIVE Sensitive     TRIMETH/SULFA >=320 RESISTANT Resistant     AMPICILLIN/SULBACTAM 16 INTERMEDIATE Intermediate     PIP/TAZO <=4 SENSITIVE Sensitive     * 30,000 COLONIES/mL ESCHERICHIA COLI  Blood Culture (routine x 2)     Status: None (Preliminary result)   Collection Time: 04/05/22  1:56 PM   Specimen: BLOOD RIGHT FOREARM  Result Value Ref Range Status   Specimen Description BLOOD RIGHT FOREARM  Final   Special Requests   Final    BOTTLES DRAWN AEROBIC AND ANAEROBIC Blood Culture results may not be optimal due to an excessive volume of blood received in culture bottles   Culture   Final    NO GROWTH 4 DAYS Performed at [facility], [address] [place], [place] [address]    Report Status PENDING  Incomplete  Respiratory (~20  pathogens) panel by PCR     Status: None   Collection Time: 04/06/22  8:30 AM   Specimen: Nasopharyngeal Swab; Respiratory  Result Value Ref Range Status   Adenovirus NOT DETECTED NOT DETECTED Final   Coronavirus 229E NOT DETECTED NOT DETECTED Final    Comment: (NOTE) The Coronavirus on the Respiratory Panel, DOES NOT test for the novel  Coronavirus (2019 nCoV)    Coronavirus HKU1 NOT DETECTED NOT DETECTED Final   Coronavirus NL63 NOT DETECTED NOT DETECTED Final   Coronavirus OC43 NOT DETECTED NOT DETECTED Final   Metapneumovirus NOT DETECTED NOT DETECTED Final   Rhinovirus / Enterovirus NOT DETECTED NOT DETECTED Final   Influenza A NOT DETECTED NOT DETECTED Final   Influenza B NOT DETECTED NOT DETECTED Final   Parainfluenza Virus 1 NOT DETECTED NOT DETECTED Final   Parainfluenza Virus 2 NOT DETECTED NOT DETECTED Final   Parainfluenza Virus 3 NOT DETECTED NOT DETECTED Final   Parainfluenza Virus 4 NOT DETECTED NOT DETECTED Final   Respiratory Syncytial Virus NOT DETECTED NOT DETECTED Final   Bordetella pertussis NOT DETECTED NOT DETECTED Final   Bordetella Parapertussis NOT DETECTED NOT DETECTED Final   Chlamydophila pneumoniae NOT DETECTED NOT DETECTED Final   Mycoplasma pneumoniae NOT DETECTED NOT DETECTED Final    Comment: Performed at [facility], [place]. [address] [place], [place] [address]  Expectorated Sputum Assessment w Gram Stain, Rflx to Resp Cult     Status: None   Collection Time: 04/06/22  4:50 PM   Specimen: Expectorated Sputum  Result Value Ref Range Status   Specimen Description EXPECTORATED SPUTUM  Final   Special Requests NONE  Final   Sputum evaluation   Final  THIS SPECIMEN IS ACCEPTABLE FOR SPUTUM CULTURE Performed at [facility], [place] [address], [place], [place] [address]    Report Status 04/07/2022 FINAL  Final  Culture, Respiratory w Gram Stain     Status: None   Collection Time: 04/06/22  4:50 PM  Result Value Ref Range Status    Specimen Description EXPECTORATED SPUTUM  Final   Special Requests NONE Reflexed from [id number]  Final   Gram Stain   Final    MODERATE WBC PRESENT, PREDOMINANTLY PMN FEW GRAM POSITIVE COCCI IN PAIRS RARE YEAST    Culture   Final    RARE Normal respiratory flora-no Staph aureus or Pseudomonas seen Performed at [facility], [place] [address], [place], [place] [address]    Report Status 04/09/2022 FINAL  Final  MRSA Next Gen by PCR, Nasal     Status: None   Collection Time: 04/06/22  4:52 PM   Specimen: Nasal Mucosa; Nasal Swab  Result Value Ref Range Status   MRSA by PCR Next Gen NOT DETECTED NOT DETECTED Final    Comment: (NOTE) The GeneXpert MRSA Assay (FDA approved for NASAL specimens only), is one component of a comprehensive MRSA colonization surveillance program. It is not intended to diagnose MRSA infection nor to guide or monitor treatment for MRSA infections. Test performance is not FDA approved in patients less than 39 years old. Performed at [facility], [place] [address] [place], [place] [address]     Labs: CBC: Recent Labs  Lab 04/05/22 1336 04/05/22 1353 04/06/22 0144 04/07/22 0152 04/08/22 0622  WBC 14.1*  --  9.3 14.7* 10.2  NEUTROABS  --   --   --  11.9*  --   HGB 16.2* 17.3* 13.9 13.6 13.9  HCT 46.6* 51.0* 41.5 40.6 42.3  MCV 98.5  --  98.8 101.5* 103.9*  PLT 261  --  223 250 027   Basic Metabolic Panel: Recent Labs  Lab 04/05/22 1336 04/05/22 1353 04/06/22 0144 04/07/22 0152 04/08/22 0622  NA 136 134* 137 137 138  K 3.2* 3.1* 3.6 3.8 3.8  CL 92*  --  93* 97* 100  CO2 29  --  '29 29 28  '$ GLUCOSE 126*  --  210* 139* 104*  BUN <5*  --  15 25* 25*  CREATININE 0.74  --  1.23* 0.94 1.00  CALCIUM 8.1*  --  8.1* 8.0* 8.2*  MG  --   --   --  1.7 1.5*  PHOS  --   --   --  3.1 3.2   Liver Function Tests: Recent Labs  Lab 04/05/22 1336 04/06/22 0144 04/07/22 0152  AST '30 21 16  '$ ALT '17 14 13  '$ ALKPHOS 146* 122 113  BILITOT 0.9 0.2* 0.3   PROT 6.8 5.7* 5.5*  ALBUMIN 3.0* 2.4* 2.4*   CBG: No results for input(s): "GLUCAP" in the last 168 hours.  Discharge time spent: greater than 30 minutes.  Signed: Patrecia Pour, MD [facility] 04/09/2022

## 2022-04-09 NOTE — Plan of Care (Signed)
°  Problem: Education: °Goal: Knowledge of disease or condition will improve °Outcome: Adequate for Discharge °Goal: Knowledge of the prescribed therapeutic regimen will improve °Outcome: Adequate for Discharge °Goal: Individualized Educational Video(s) °Outcome: Adequate for Discharge °  °Problem: Activity: °Goal: Ability to tolerate increased activity will improve °Outcome: Adequate for Discharge °Goal: Will verbalize the importance of balancing activity with adequate rest periods °Outcome: Adequate for Discharge °  °Problem: Respiratory: °Goal: Ability to maintain a clear airway will improve °Outcome: Adequate for Discharge °Goal: Levels of oxygenation will improve °Outcome: Adequate for Discharge °Goal: Ability to maintain adequate ventilation will improve °Outcome: Adequate for Discharge °  °Problem: Activity: °Goal: Ability to tolerate increased activity will improve °Outcome: Adequate for Discharge °  °Problem: Clinical Measurements: °Goal: Ability to maintain a body temperature in the normal range will improve °Outcome: Adequate for Discharge °  °Problem: Respiratory: °Goal: Ability to maintain adequate ventilation will improve °Outcome: Adequate for Discharge °Goal: Ability to maintain a clear airway will improve °Outcome: Adequate for Discharge °  °Problem: Education: °Goal: Knowledge of General Education information will improve °Description: Including pain rating scale, medication(s)/side effects and non-pharmacologic comfort measures °Outcome: Adequate for Discharge °  °Problem: Health Behavior/Discharge Planning: °Goal: Ability to manage health-related needs will improve °Outcome: Adequate for Discharge °  °Problem: Clinical Measurements: °Goal: Ability to maintain clinical measurements within normal limits will improve °Outcome: Adequate for Discharge °Goal: Will remain free from infection °Outcome: Adequate for Discharge °Goal: Diagnostic test results will improve °Outcome: Adequate for  Discharge °Goal: Respiratory complications will improve °Outcome: Adequate for Discharge °Goal: Cardiovascular complication will be avoided °Outcome: Adequate for Discharge °  °Problem: Activity: °Goal: Risk for activity intolerance will decrease °Outcome: Adequate for Discharge °  °Problem: Nutrition: °Goal: Adequate nutrition will be maintained °Outcome: Adequate for Discharge °  °Problem: Coping: °Goal: Level of anxiety will decrease °Outcome: Adequate for Discharge °  °Problem: Elimination: °Goal: Will not experience complications related to bowel motility °Outcome: Adequate for Discharge °Goal: Will not experience complications related to urinary retention °Outcome: Adequate for Discharge °  °Problem: Pain Managment: °Goal: General experience of comfort will improve °Outcome: Adequate for Discharge °  °Problem: Safety: °Goal: Ability to remain free from injury will improve °Outcome: Adequate for Discharge °  °Problem: Skin Integrity: °Goal: Risk for impaired skin integrity will decrease °Outcome: Adequate for Discharge °  °

## 2022-04-09 NOTE — [place] Notes (Signed)
Pt continues to have very harsh cough-intermittently productive. She continues to have dyspnea with exertion to [facility]. O2 remains 5L/[place] with sats maintaining 92-94%. Jessie Foot, RN

## 2022-04-09 NOTE — Progress Notes (Signed)
Physical Therapy Treatment Patient Details Name: Vicki [name] MRN: [id number] DOB: 06/23/1965 Today's Date: 04/09/2022   History of Present Illness Pt is a 56 y/o female presenting 11/23 wi worsening cough and SOB due to COPD exacerbation.  PMH:  COPD, with Emphysema, asthma, carpal tunnel with release    PT Comments    The pt was agreeable to session with focus on progressing mobility and monitoring O2 with exertion. The pt was able to progress distance while maintaining SpO2 89-93% on 4L with gait. She was cued for a single standing rest break due to SpO2 of 89%, improved to 93% with standing rest. Pt educated on continued progression of activity following return home and was agreeable.   Recommendations for follow up therapy are one component of a multi-disciplinary discharge planning process, led by the attending physician.  Recommendations may be updated based on patient status, additional functional criteria and insurance authorization.  Follow Up Recommendations  No PT follow up     Assistance Recommended at Discharge Intermittent Supervision/Assistance  Patient can return home with the following Assist for transportation;Help with stairs or ramp for entrance;Assistance with cooking/housework;A little help with walking and/or transfers   Equipment Recommendations  None recommended by PT    Recommendations for Other Services       Precautions / Restrictions Precautions Precautions: Fall Precaution Comments: on 5L O2at rest, 4L O2 with mobility Restrictions Weight Bearing Restrictions: No     Mobility  Bed Mobility Overal bed mobility: Modified Independent             General bed mobility comments: pt leaving HOB elevated, able to manage lines and bed to get out of bed without asist    Transfers Overall transfer level: Needs assistance Equipment used: Rolling walker (2 wheels), None Transfers: Sit to/from Stand Sit to Stand: Supervision            General transfer comment: cues for safety and hand placement    Ambulation/Gait Ambulation/Gait assistance: Min guard Gait Distance (Feet): 150 Feet Assistive device: Rolling walker (2 wheels) Gait Pattern/deviations: Step-through pattern Gait velocity: decreased Gait velocity interpretation: 1.31 - 2.62 ft/sec, indicative of limited community ambulator   General Gait Details: single standing rest with cues to maintain SpO2 >88%. Pt able to recover from 89% to mid 90s with standing rest on 4L. no overt LOB       Balance Overall balance assessment: No apparent balance deficits (not formally assessed)                                          Cognition Arousal/Alertness: Awake/alert Behavior During Therapy: WFL for tasks assessed/performed Overall Cognitive Status: Within Functional Limits for tasks assessed                                          Exercises      General Comments General comments (skin integrity, edema, etc.): SpO2 89-93% on 4L with gait, 95% on 5L at rest      Pertinent Vitals/Pain Pain Assessment Pain Assessment: No/denies pain Faces Pain Scale: No hurt     PT Goals (current goals can now be found in the care plan section) Acute Rehab PT Goals Patient Stated Goal: I want to be able to get out more.  It's  hard with the heavy O2 tanks.  I'd like to get something smaller. PT Goal Formulation: With patient Time For Goal Achievement: 04/20/22 Potential to Achieve Goals: Good Progress towards PT goals: Progressing toward goals    Frequency    Min 3X/week      PT Plan Current plan remains appropriate       AM-PAC PT "6 Clicks" Mobility   Outcome Measure  Help needed turning from your back to your side while in a flat bed without using bedrails?: None Help needed moving from lying on your back to sitting on the side of a flat bed without using bedrails?: None Help needed moving to and from a bed to a chair  (including a wheelchair)?: A Little Help needed standing up from a chair using your arms (e.g., wheelchair or bedside chair)?: A Little Help needed to walk in hospital room?: A Little Help needed climbing 3-5 steps with a railing? : A Little 6 Click Score: 20    End of Session Equipment Utilized During Treatment: Oxygen Activity Tolerance: Patient limited by fatigue;Patient tolerated treatment well Patient left: in bed;with call bell/phone within reach Nurse Communication: Mobility status PT Visit Diagnosis: Other abnormalities of gait and mobility (R26.89);Difficulty in walking, not elsewhere classified (R26.2)     Time: 6415-8309 PT Time Calculation (min) (ACUTE ONLY): 18 min  Charges:  $Therapeutic Exercise: 8-22 mins                     West Carbo, PT, DPT   Acute Rehabilitation Department   Sandra Cockayne 04/09/2022, 12:28 PM

## 2022-04-10 LAB — CULTURE, BLOOD (ROUTINE X 2)
Culture: NO GROWTH
Culture: NO GROWTH

## 2022-04-11 ENCOUNTER — Other Ambulatory Visit: Payer: Self-pay

## 2022-04-11 ENCOUNTER — Emergency Department (HOSPITAL_COMMUNITY): Payer: Commercial Managed Care - HMO

## 2022-04-11 ENCOUNTER — Inpatient Hospital Stay (HOSPITAL_COMMUNITY)
Admission: EM | Admit: 2022-04-11 | Discharge: 2022-04-16 | DRG: 871 | Disposition: A | Discharge status: Home / Self Care | Payer: Commercial Managed Care - HMO | Attending: Internal Medicine | Admitting: Internal Medicine

## 2022-04-11 ENCOUNTER — Encounter (HOSPITAL_COMMUNITY): Payer: Self-pay | Admitting: Emergency Medicine

## 2022-04-11 DIAGNOSIS — F411 Generalized anxiety disorder: Secondary | ICD-10-CM | POA: Diagnosis present

## 2022-04-11 DIAGNOSIS — J449 Chronic obstructive pulmonary disease, unspecified: Secondary | ICD-10-CM | POA: Diagnosis not present

## 2022-04-11 DIAGNOSIS — K5792 Diverticulitis of intestine, part unspecified, without perforation or abscess without bleeding: Secondary | ICD-10-CM | POA: Diagnosis present

## 2022-04-11 DIAGNOSIS — E860 Dehydration: Secondary | ICD-10-CM | POA: Diagnosis present

## 2022-04-11 DIAGNOSIS — Z1152 Encounter for screening for COVID-19: Secondary | ICD-10-CM | POA: Diagnosis not present

## 2022-04-11 DIAGNOSIS — J9611 Chronic respiratory failure with hypoxia: Secondary | ICD-10-CM | POA: Diagnosis present

## 2022-04-11 DIAGNOSIS — A0472 Enterocolitis due to Clostridium difficile, not specified as recurrent: Secondary | ICD-10-CM | POA: Diagnosis present

## 2022-04-11 DIAGNOSIS — A419 Sepsis, unspecified organism: Principal | ICD-10-CM | POA: Diagnosis present

## 2022-04-11 DIAGNOSIS — J189 Pneumonia, unspecified organism: Secondary | ICD-10-CM | POA: Diagnosis present

## 2022-04-11 DIAGNOSIS — Z881 Allergy status to other antibiotic agents status: Secondary | ICD-10-CM | POA: Diagnosis not present

## 2022-04-11 DIAGNOSIS — Z801 Family history of malignant neoplasm of trachea, bronchus and lung: Secondary | ICD-10-CM

## 2022-04-11 DIAGNOSIS — K219 Gastro-esophageal reflux disease without esophagitis: Secondary | ICD-10-CM | POA: Diagnosis present

## 2022-04-11 DIAGNOSIS — Z9981 Dependence on supplemental oxygen: Secondary | ICD-10-CM | POA: Diagnosis not present

## 2022-04-11 DIAGNOSIS — I10 Essential (primary) hypertension: Secondary | ICD-10-CM | POA: Diagnosis present

## 2022-04-11 DIAGNOSIS — Z880 Allergy status to penicillin: Secondary | ICD-10-CM | POA: Diagnosis not present

## 2022-04-11 DIAGNOSIS — J440 Chronic obstructive pulmonary disease with acute lower respiratory infection: Secondary | ICD-10-CM | POA: Diagnosis present

## 2022-04-11 DIAGNOSIS — E871 Hypo-osmolality and hyponatremia: Secondary | ICD-10-CM | POA: Diagnosis not present

## 2022-04-11 DIAGNOSIS — Z803 Family history of malignant neoplasm of breast: Secondary | ICD-10-CM | POA: Diagnosis not present

## 2022-04-11 DIAGNOSIS — Z885 Allergy status to narcotic agent status: Secondary | ICD-10-CM

## 2022-04-11 DIAGNOSIS — Z720 Tobacco use: Secondary | ICD-10-CM | POA: Diagnosis present

## 2022-04-11 DIAGNOSIS — Z808 Family history of malignant neoplasm of other organs or systems: Secondary | ICD-10-CM | POA: Diagnosis not present

## 2022-04-11 DIAGNOSIS — D72829 Elevated white blood cell count, unspecified: Secondary | ICD-10-CM | POA: Diagnosis not present

## 2022-04-11 DIAGNOSIS — Z888 Allergy status to other drugs, medicaments and biological substances status: Secondary | ICD-10-CM

## 2022-04-11 DIAGNOSIS — G4700 Insomnia, unspecified: Secondary | ICD-10-CM | POA: Diagnosis present

## 2022-04-11 DIAGNOSIS — F32A Depression, unspecified: Secondary | ICD-10-CM | POA: Diagnosis present

## 2022-04-11 DIAGNOSIS — Z7982 Long term (current) use of aspirin: Secondary | ICD-10-CM

## 2022-04-11 DIAGNOSIS — K5732 Diverticulitis of large intestine without perforation or abscess without bleeding: Secondary | ICD-10-CM | POA: Diagnosis present

## 2022-04-11 DIAGNOSIS — F17210 Nicotine dependence, cigarettes, uncomplicated: Secondary | ICD-10-CM | POA: Diagnosis present

## 2022-04-11 DIAGNOSIS — Z23 Encounter for immunization: Secondary | ICD-10-CM | POA: Diagnosis not present

## 2022-04-11 DIAGNOSIS — K5720 Diverticulitis of large intestine with perforation and abscess without bleeding: Secondary | ICD-10-CM | POA: Diagnosis present

## 2022-04-11 DIAGNOSIS — Z79899 Other long term (current) drug therapy: Secondary | ICD-10-CM

## 2022-04-11 DIAGNOSIS — Z8041 Family history of malignant neoplasm of ovary: Secondary | ICD-10-CM | POA: Diagnosis not present

## 2022-04-11 LAB — I-STAT BETA HCG BLOOD, ED (MC, WL, AP ONLY): I-stat hCG, quantitative: <5 m[IU]/mL (ref <5)

## 2022-04-11 LAB — COMPREHENSIVE METABOLIC PANEL
ALT: 34 U/L (ref 0–44)
AST: 25 U/L (ref 15–41)
Albumin: 2.9 g/dL — ABNORMAL LOW (ref 3.5–5.0)
Alkaline Phosphatase: 92 U/L (ref 38–126)
Anion gap: 17 — ABNORMAL HIGH (ref 5–15)
BUN: 10 mg/dL (ref 6–20)
CO2: 27 mmol/L (ref 22–32)
Calcium: 8.5 mg/dL — ABNORMAL LOW (ref 8.9–10.3)
Chloride: 91 mmol/L — ABNORMAL LOW (ref 98–111)
Creatinine, Ser: 0.79 mg/dL (ref 0.44–1.00)
GFR, Estimated: >60 mL/min (ref >60)
Glucose, Bld: 99 mg/dL (ref 70–99)
Potassium: 3.8 mmol/L (ref 3.5–5.1)
Sodium: 135 mmol/L (ref 135–145)
Total Bilirubin: 0.9 mg/dL (ref 0.3–1.2)
Total Protein: 6.5 g/dL (ref 6.5–8.1)

## 2022-04-11 LAB — CBC
HCT: 43 % (ref 36.0–46.0)
Hemoglobin: 14.9 g/dL (ref 12.0–15.0)
MCH: 33.6 pg (ref 26.0–34.0)
MCHC: 34.7 g/dL (ref 30.0–36.0)
MCV: 97.1 fL (ref 80.0–100.0)
Platelets: 401 10*3/uL — ABNORMAL HIGH (ref 150–400)
RBC: 4.43 MIL/uL (ref 3.87–5.11)
RDW: 13.6 % (ref 11.5–15.5)
WBC: 30.7 10*3/uL — ABNORMAL HIGH (ref 4.0–10.5)
nRBC: 0 % (ref 0.0–0.2)

## 2022-04-11 LAB — LIPASE, BLOOD: Lipase: 30 U/L (ref 11–51)

## 2022-04-11 LAB — LACTIC ACID, PLASMA: Lactic Acid, Venous: 0.7 mmol/L (ref 0.5–1.9)

## 2022-04-11 MED ORDER — SODIUM CHLORIDE 0.9 % IV SOLN
2.0000 g | Freq: Three times a day (TID) | INTRAVENOUS | Status: DC
  Administered 2022-04-11 – 2022-04-12 (×2): 2 g via INTRAVENOUS
  Filled 2022-04-11 (×2): qty 12.5

## 2022-04-11 MED ORDER — METRONIDAZOLE 500 MG/100ML IV SOLN
500.0000 mg | Freq: Two times a day (BID) | INTRAVENOUS | Status: AC
  Administered 2022-04-11 – 2022-04-15 (×9): 500 mg via INTRAVENOUS
  Filled 2022-04-11 (×9): qty 100

## 2022-04-11 MED ORDER — LACTATED RINGERS IV BOLUS
1000.0000 mL | Freq: Once | INTRAVENOUS | Status: AC
  Administered 2022-04-11: 1000 mL via INTRAVENOUS

## 2022-04-11 MED ORDER — ONDANSETRON 4 MG PO TBDP
4.0000 mg | ORAL_TABLET | Freq: Once | ORAL | Status: AC
  Administered 2022-04-11: 4 mg via ORAL
  Filled 2022-04-11: qty 1

## 2022-04-11 MED ORDER — ACETAMINOPHEN 650 MG RE SUPP: 650.0000 mg | Freq: Four times a day (QID) | RECTAL | Status: DC | PRN

## 2022-04-11 MED ORDER — FENTANYL CITRATE PF 50 MCG/ML IJ SOSY
25.0000 ug | PREFILLED_SYRINGE | INTRAMUSCULAR | Status: DC | PRN
  Administered 2022-04-11: 25 ug via INTRAVENOUS
  Filled 2022-04-11: qty 1

## 2022-04-11 MED ORDER — ACETAMINOPHEN 500 MG PO TABS
1000.0000 mg | ORAL_TABLET | ORAL | Status: AC
  Administered 2022-04-11: 1000 mg via ORAL
  Filled 2022-04-11: qty 2

## 2022-04-11 MED ORDER — ONDANSETRON HCL 4 MG/2ML IJ SOLN
4.0000 mg | Freq: Four times a day (QID) | INTRAMUSCULAR | Status: DC | PRN
  Administered 2022-04-15 (×2): 4 mg via INTRAVENOUS
  Filled 2022-04-11 (×2): qty 2

## 2022-04-11 MED ORDER — ACETAMINOPHEN 325 MG PO TABS
650.0000 mg | ORAL_TABLET | Freq: Four times a day (QID) | ORAL | Status: DC | PRN
  Administered 2022-04-15 (×2): 650 mg via ORAL
  Filled 2022-04-11 (×2): qty 2

## 2022-04-11 MED ORDER — FENTANYL CITRATE PF 50 MCG/ML IJ SOSY
50.0000 ug | PREFILLED_SYRINGE | Freq: Once | INTRAMUSCULAR | Status: AC
  Administered 2022-04-11: 50 ug via INTRAVENOUS
  Filled 2022-04-11: qty 1

## 2022-04-11 MED ORDER — NALOXONE HCL 0.4 MG/ML IJ SOLN: 0.4000 mg | INTRAMUSCULAR | Status: DC | PRN

## 2022-04-11 MED ORDER — LACTATED RINGERS IV SOLN: INTRAVENOUS | Status: AC

## 2022-04-11 MED ORDER — IOHEXOL 350 MG/ML SOLN
75.0000 mL | Freq: Once | INTRAVENOUS | Status: AC | PRN
  Administered 2022-04-11: 75 mL via INTRAVENOUS

## 2022-04-11 MED ORDER — SODIUM CHLORIDE 0.9 % IV BOLUS
1000.0000 mL | Freq: Once | INTRAVENOUS | Status: AC
  Administered 2022-04-11: 1000 mL via INTRAVENOUS

## 2022-04-11 MED ORDER — MELATONIN 3 MG PO TABS: 3.0000 mg | ORAL_TABLET | Freq: Every evening | ORAL | Status: DC | PRN

## 2022-04-11 NOTE — Sepsis Progress Note (Signed)
Elink monitoring for the code sepsis protocol.  

## 2022-04-11 NOTE — ED Triage Notes (Signed)
Pt just completed hospital stay for pneumonia and given IV antibiotics. She now presents with intense pelvic pain and diarrhea. Started yesterday evening. She states she has been doubled over in pain.

## 2022-04-11 NOTE — ED Provider Triage Note (Signed)
Emergency Medicine Provider Triage Evaluation Note  Vicki [name] , a 56 y.o. female  was evaluated in triage.  Pt complains of abd pain, diarrhea and nausea since yesterday. She was recently in [facility] for pneumonia. States she has had numerous episodes of watery diarrhea today/last night. States she is hurting too badly to walk. No known fever at home.  No CP. Somewhat SOB but mostly states just tired.   Review of Systems  Positive: Diarrhea, abd pain Negative: Fever  Physical Exam  BP 124/72 (BP Location: Left Arm)   Pulse (!) 121   Temp 99.3 F (37.4 C)   Resp 19   SpO2 92%  Gen:   Awake,uncomfortable Resp:  Normal effort  MSK:   Moves extremities without difficulty  Other:  Abd diffusely tttp  Medical Decision Making  Medically screening exam initiated at 7:47 PM.  Appropriate orders placed.  Crystin Lechtenberg was informed that the remainder of the evaluation will be completed by another provider, this initial triage assessment does not replace that evaluation, and the importance of remaining in the ED until their evaluation is complete.  Labs, CT AP   Pati Gallo [place], [place] 04/11/22 1951

## 2022-04-11 NOTE — ED Provider Notes (Signed)
[facility] EMERGENCY DEPARTMENT Provider Note   CSN: [id number] Arrival date & time: 04/11/22  1907     History  Chief Complaint  Patient presents with   Abdominal Pain    Vicki [name] is a 56 y.o. female.  Patient here with lower abdominal pain with nausea and diarrhea.  She currently finished antibiotics for pneumonia yesterday.  She has been having severe lower cramping abdominal pain since last night with multiple loose stools.  She has a history of diverticulitis, COPD, hypertension, 2 and half to 3 L of oxygen at baseline.  She denies any black or bloody stools.  No other suspicious food intake.  Denies any fevers or chills.  No shortness of breath cough or chest pain.  The history is provided by the patient.       Home Medications Prior to Admission medications   Medication Sig Start Date End Date Taking? Authorizing Provider  acetaminophen (TYLENOL) 500 MG tablet Take 1,000 mg by mouth every 6 (six) hours as needed for moderate pain.    [provider]  albuterol (VENTOLIN HFA) 108 (90 Base) MCG/ACT inhaler INHALE 2 PUFFS INTO THE LUNGS TWICE A DAY Patient taking differently: Inhale 2 puffs into the lungs every 4 (four) hours. 11/20/21   Maximiano Coss, NP  ALPRAZolam Duanne Moron) 1 MG tablet TAKE 1 TABLET BY MOUTH THREE TIMES A DAY AS NEEDED FOR ANXIETY Patient taking differently: Take 1 mg by mouth 3 (three) times daily as needed for anxiety. 04/03/22   Wendie Agreste, MD  amLODipine (NORVASC) 5 MG tablet Take 1 tablet (5 mg total) by mouth daily. 07/07/21   Maximiano Coss, NP  ASPIRIN LOW DOSE 81 MG EC tablet TAKE 1 TABLET BY MOUTH 2 TIMES DAILY. Patient taking differently: Take 81 mg by mouth in the morning and at bedtime. 03/21/21   Maximiano Coss, NP  azelastine (ASTELIN) 0.1 % nasal spray Place 1 spray into both nostrils 2 (two) times daily. Use in each nostril as directed Patient taking differently: Place 1 spray into both  nostrils daily. 07/07/21   Maximiano Coss, NP  azithromycin (ZITHROMAX) 500 MG tablet Take 1 tablet (500 mg total) by mouth daily. 04/09/22   Patrecia Pour, MD  chlorpheniramine-HYDROcodone (TUSSIONEX) 10-8 MG/5ML Take 5 mLs by mouth every 12 (twelve) hours as needed for cough. 04/09/22   Patrecia Pour, MD  citalopram (CELEXA) 20 MG tablet Take 60 mg daily Patient not taking: Reported on 04/05/2022 02/01/22   Midge Minium, MD  EPINEPHrine (EPIPEN 2-PAK) 0.3 mg/0.3 mL IJ SOAJ injection Inject 0.3 mg into the muscle as needed for anaphylaxis. 12/27/20   Maximiano Coss, NP  famotidine (PEPCID) 20 MG tablet TAKE 1 TABLET BY MOUTH TWICE A DAY (MORNING AND NIGHT) Patient taking differently: Take 20 mg by mouth 2 (two) times daily at 8 am and 10 pm. 11/07/20   Valentina Shaggy, MD  gabapentin (NEURONTIN) 300 MG capsule Take 1 capsule (300 mg total) by mouth 2 (two) times daily. 07/07/21   Maximiano Coss, NP  hydrOXYzine (ATARAX) 25 MG tablet TAKE 0.5-1 TABLETS (12.5-25 MG TOTAL) BY MOUTH 3 (THREE) TIMES DAILY AS NEEDED FOR ANXIETY. 09/18/21   Maximiano Coss, NP  ibuprofen (ADVIL) 600 MG tablet Take 1 tablet (600 mg total) by mouth every 6 (six) hours as needed (severe pain). 04/09/22   Patrecia Pour, MD  ipratropium-albuterol (DUONEB) 0.5-2.5 (3) MG/3ML SOLN Use 1 vial by nebulization in the morning and at bedtime.  10/10/21   Maximiano Coss, NP  Magnesium Citrate 100 MG TABS Take 1 tablet by mouth at bedtime. Patient taking differently: Take 100 mg by mouth at bedtime. 10/10/21   Maximiano Coss, NP  meclizine (ANTIVERT) 25 MG tablet Take 1 tablet (25 mg total) by mouth 3 (three) times daily as needed for dizziness. 12/27/20   Maximiano Coss, NP  montelukast (SINGULAIR) 10 MG tablet Take 1 tablet (10 mg total) by mouth at bedtime. Patient not taking: Reported on 04/05/2022 07/07/21   Maximiano Coss, NP  omeprazole (PRILOSEC) 20 MG capsule TAKE 1 CAPSULE BY MOUTH TWICE A DAY Patient taking  differently: Take 20 mg by mouth daily. 07/10/21   Maximiano Coss, NP  ondansetron (ZOFRAN) 8 MG tablet Take 1 tablet (8 mg total) by mouth every 8 (eight) hours as needed for nausea or vomiting. 12/06/21   Maximiano Coss, NP  potassium chloride SA (KLOR-CON M) 20 MEQ tablet Take 1 tablet (20 mEq total) by mouth daily. 10/10/21   Maximiano Coss, NP  prazosin (MINIPRESS) 1 MG capsule TAKE 1 TO 2 CAPSULES (1 TO 2 MG TOTAL) BY MOUTH AT BEDTIME. Patient taking differently: Take 1-2 mg by mouth at bedtime. 12/27/20   Maximiano Coss, NP  predniSONE (DELTASONE) 10 MG tablet Take 4 tablets (40 mg total) by mouth daily with breakfast for 3 days, THEN 2 tablets (20 mg total) daily with breakfast for 3 days, THEN 1 tablet (10 mg total) daily with breakfast for 3 days. 04/10/22 04/19/22  Patrecia Pour, MD  promethazine (PHENERGAN) 25 MG tablet Take 1 tablet (25 mg total) by mouth every 8 (eight) hours as needed. Patient taking differently: Take 25 mg by mouth every 8 (eight) hours as needed for nausea or vomiting. 12/06/21   Maximiano Coss, NP  rizatriptan (MAXALT) 10 MG tablet TAKE 1 TABLET BY MOUTH AS NEEDED FOR MIGRAINE. MAY REPEAT IN 2 HOURS IF NEEDED Patient taking differently: Take 5 mg by mouth 2 (two) times daily as needed for migraine. 10/17/21   Maximiano Coss, NP  rOPINIRole (REQUIP) 2 MG tablet Take 0.5-1 tablets (1-2 mg total) by mouth at bedtime. 10/10/21   Maximiano Coss, NP  STIOLTO RESPIMAT 2.5-2.5 MCG/ACT AERS Inhale 1 puff into the lungs daily. 11/20/21   Maximiano Coss, NP  traZODone (DESYREL) 50 MG tablet Take 1 tablet (50 mg total) by mouth at bedtime. 07/07/21   Maximiano Coss, NP      Allergies    Jasmine oil, Levaquin [levofloxacin], Penicillins, Aleve [naproxen sodium], Ceftin [cefuroxime axetil], Chlorhexidine, Depo-provera [medroxyprogesterone acetate], Doxycycline, Oxycodone, and Tramadol    Review of Systems   Review of Systems  Physical Exam Updated Vital Signs BP 103/62    Pulse 86   Temp 98.4 F (36.9 C) (Oral)   Resp (!) 21   Ht '5\' 5"'$  (1.651 m)   Wt 68 kg   SpO2 92%   BMI 24.95 kg/m  Physical Exam Vitals and nursing note reviewed.  Constitutional:      General: She is not in acute distress.    Appearance: She is well-developed. She is not ill-appearing.  HENT:     Head: Normocephalic and atraumatic.     Mouth/Throat:     Mouth: Mucous membranes are moist.  Eyes:     Extraocular Movements: Extraocular movements intact.     Conjunctiva/sclera: Conjunctivae normal.     Pupils: Pupils are equal, round, and reactive to light.  Cardiovascular:     Rate and Rhythm: Normal rate and regular  rhythm.     Heart sounds: Normal heart sounds. No murmur heard. Pulmonary:     Effort: Pulmonary effort is normal. No respiratory distress.     Breath sounds: Normal breath sounds.  Abdominal:     General: Abdomen is flat.     Palpations: Abdomen is soft.     Tenderness: There is abdominal tenderness in the right lower quadrant, suprapubic area and left lower quadrant. There is guarding.  Musculoskeletal:        General: No swelling.     Cervical back: Neck supple.  Skin:    General: Skin is warm and dry.     Capillary Refill: Capillary refill takes less than 2 seconds.  Neurological:     Mental Status: She is alert.  Psychiatric:        Mood and Affect: Mood normal.     ED Results / Procedures / Treatments   Labs (all labs ordered are listed, but only abnormal results are displayed) Labs Reviewed  COMPREHENSIVE METABOLIC PANEL - Abnormal; Notable for the following components:      Result Value   Chloride 91 (*)    Calcium 8.5 (*)    Albumin 2.9 (*)    Anion gap 17 (*)    All other components within normal limits  CBC - Abnormal; Notable for the following components:   WBC 30.7 (*)    Platelets 401 (*)    All other components within normal limits  GASTROINTESTINAL PANEL BY PCR, STOOL (REPLACES STOOL CULTURE)  C DIFFICILE QUICK SCREEN W PCR  REFLEX    CULTURE, BLOOD (ROUTINE X 2)  CULTURE, BLOOD (ROUTINE X 2)  LIPASE, BLOOD  LACTIC ACID, PLASMA  URINALYSIS, ROUTINE W REFLEX MICROSCOPIC  CBC WITH DIFFERENTIAL/PLATELET  COMPREHENSIVE METABOLIC PANEL  MAGNESIUM  MAGNESIUM  I-STAT BETA HCG BLOOD, ED ([facility], [facility], [facility] ONLY)    EKG None  Radiology CT ABDOMEN PELVIS W CONTRAST  Result Date: 04/11/2022 CLINICAL DATA:  Acute abdominal pain and diarrhea, initial encounter EXAM: CT ABDOMEN AND PELVIS WITH CONTRAST TECHNIQUE: Multidetector CT imaging of the abdomen and pelvis was performed using the standard protocol following bolus administration of intravenous contrast. RADIATION DOSE REDUCTION: This exam was performed according to the departmental dose-optimization program which includes automated exposure control, adjustment of the mA and/or kV according to patient size and/or use of iterative reconstruction technique. CONTRAST:  18m OMNIPAQUE IOHEXOL 350 MG/ML SOLN COMPARISON:  04/26/2021 FINDINGS: Lower chest: Lung bases are well aerated. Lingular scarring is noted. Slightly more organized nodular density is noted in the lateral aspect of the left lung base measuring 12 x 7 mm. Mild airspace opacity is noted in the posterior aspect of the right lung base consistent with resolving pneumonia. Hepatobiliary: No focal liver abnormality is seen. Status post cholecystectomy. No biliary dilatation. Pancreas: Unremarkable. No pancreatic ductal dilatation or surrounding inflammatory changes. Spleen: Normal in size without focal abnormality. Adrenals/Urinary Tract: Adrenal glands are within normal limits. Kidneys demonstrate a normal enhancement pattern bilaterally. No renal calculi are seen. No obstructive changes are noted. The bladder is decompressed. Stomach/Bowel: Diverticular change of the colon is noted with pericolonic inflammatory change and mild free fluid in the pelvis consistent with diverticulitis. No focal abscess is seen. More proximal  colon is unremarkable. The appendix is within normal limits. Small bowel and within normal limits. Vascular/Lymphatic: Aortic atherosclerosis. No enlarged abdominal or pelvic lymph nodes. Reproductive: Status post hysterectomy. No adnexal masses. Other: Minimal free fluid is noted in the pelvis reactive to  the inflammatory changes from diverticulitis. Again no abscess is noted. Musculoskeletal: Degenerative changes of lumbar spine are noted. IMPRESSION: Diverticulitis of the sigmoid colon without evidence of abscess formation. No definitive perforation is seen. Resolving infiltrate in the right base. 10 mm left solid pulmonary nodule detected on incomplete chest CT. Per Fleischner Society Guidelines, recommend prompt non-contrast Chest CT for further evaluation. These guidelines do not apply to immunocompromised patients and patients with cancer. Follow up in patients with significant comorbidities as clinically warranted. For lung cancer screening, adhere to Lung-RADS guidelines. Reference: Radiology. 2017; 284(1):228-43. Electronically Signed   By: Inez Catalina M.D.   On: 04/11/2022 21:48    Procedures .Critical Care  Performed by: Lennice Sites, DO Authorized by: Lennice Sites, DO   Critical care provider statement:    Critical care time (minutes):  35   Critical care was necessary to treat or prevent imminent or life-threatening deterioration of the following conditions:  Sepsis   Critical care was time spent personally by me on the following activities:  Blood draw for specimens, development of treatment plan with patient or surrogate, evaluation of patient's response to treatment, discussions with primary provider, examination of patient, obtaining history from patient or surrogate, ordering and performing treatments and interventions, ordering and review of laboratory studies, ordering and review of radiographic studies, pulse oximetry, re-evaluation of patient's condition and review of old  charts   I assumed direction of critical care for this patient from another provider in my specialty: no       Medications Ordered in ED Medications  ceFEPIme (MAXIPIME) 2 g in sodium chloride 0.9 % 100 mL IVPB (0 g Intravenous Stopped 04/11/22 2255)  metroNIDAZOLE (FLAGYL) IVPB 500 mg (0 mg Intravenous Stopped 04/11/22 2327)  lactated ringers infusion ( Intravenous New Bag/Given 04/11/22 2227)  acetaminophen (TYLENOL) tablet 650 mg (has no administration in time range)    Or  acetaminophen (TYLENOL) suppository 650 mg (has no administration in time range)  melatonin tablet 3 mg (has no administration in time range)  naloxone (NARCAN) injection 0.4 mg (has no administration in time range)  fentaNYL (SUBLIMAZE) injection 25 mcg (has no administration in time range)  ondansetron (ZOFRAN) injection 4 mg (has no administration in time range)  ondansetron (ZOFRAN-ODT) disintegrating tablet 4 mg (4 mg Oral Given 04/11/22 2032)  acetaminophen (TYLENOL) tablet 1,000 mg (1,000 mg Oral Given 04/11/22 2033)  fentaNYL (SUBLIMAZE) injection 50 mcg (50 mcg Intravenous Given 04/11/22 2046)  sodium chloride 0.9 % bolus 1,000 mL (0 mLs Intravenous Stopped 04/11/22 2227)  iohexol (OMNIPAQUE) 350 MG/ML injection 75 mL (75 mLs Intravenous Contrast Given 04/11/22 2131)  lactated ringers bolus 1,000 mL (1,000 mLs Intravenous New Bag/Given 04/11/22 2342)    ED Course/ Medical Decision Making/ A&P                           Medical Decision Making Amount and/or Complexity of Data Reviewed Labs: ordered.  Risk Prescription drug management. Decision regarding hospitalization.   Vicki [name] is here with abdominal pain, nausea, diarrhea.  Just finished antibiotics for pneumonia.  Having significant lower abdominal pain and cramping since last night.  Patient with low-grade temp of 99.3.  Mild tachycardia.  She is on chronic oxygen for COPD history.  History of diverticulitis.  Differential  diagnosis is diverticulitis versus UTI versus viral process versus less likely bowel obstruction versus possibly C. difficile.  Will check CBC, CMP, lipase, urinalysis, CT scan abdomen pelvis.  White count per my review and interpretation significantly elevated at 30.  At this time workup expanded to include blood cultures and lactic acid as my concern is that there is an intra-abdominal infection.  IV antibiotics to be started.  She has been given IV fluids, IV fentanyl and Tylenol.  My suspicion is that there is possibly sepsis process.  CT scan does show diverticulitis of the sigmoid colon.  There is no abscess or perforation.  Pneumonia appears to be improving.  Lab work is otherwise unremarkable.  Blood cultures and lactic acid are pending at time of admission to medicine team.  This chart was dictated using voice recognition software.  Despite best efforts to proofread,  errors can occur which can change the documentation meaning.         Final Clinical Impression(s) / ED Diagnoses Final diagnoses:  Acute diverticulitis  Sepsis, due to unspecified organism, unspecified whether acute organ dysfunction present [facility])    Rx / DC Orders ED Discharge Orders     None         Lennice Sites, DO 04/11/22 2344

## 2022-04-11 NOTE — Progress Notes (Signed)
Pharmacy Antibiotic Note  Vicki [name] is a 56 y.o. female admitted on 04/11/2022 with  intra-abdominal infection .  Pharmacy has been consulted for cefepime and metronidazole dosing in the setting of penicillin allergy with reaction of anaphylaxis. Pt reprorts tolerating ceftriaxone in the past.   WBC 30.7, Tmax 99.3, HR 90-120s, RR 16-19, SBP 100-120s Scr 0.79  Plan: Initiate Cefepime 2g IV q8h Initiate Metronidazole '500mg'$  IV q12h per MD Monitor daily CBC, temp, SCr, and for clinical signs of improvement  F/u cultures and de-escalate antibiotics as able   Height: '5\' 5"'$  (165.1 cm) Weight: 68 kg (149 lb 14.6 oz) IBW/kg (Calculated) : 57  Temp (24hrs), Avg:99.3 F (37.4 C), Min:99.3 F (37.4 C), Max:99.3 F (37.4 C)  Recent Labs  Lab 04/05/22 1336 04/05/22 1354 04/05/22 1605 04/06/22 0144 04/07/22 0152 04/08/22 0622 04/11/22 1947  WBC 14.1*  --   --  9.3 14.7* 10.2 30.7*  CREATININE 0.74  --   --  1.23* 0.94 1.00 0.79  LATICACIDVEN  --  2.0* 1.3  --   --   --   --     Estimated Creatinine Clearance: 72.3 mL/min (by C-G formula based on SCr of 0.79 mg/dL).    Allergies  Allergen Reactions   Jasmine Oil Shortness Of Breath and Swelling   Levaquin [Levofloxacin] Shortness Of Breath, Itching, Swelling and Rash   Penicillins Anaphylaxis and Hives    Tolerated ceftriaxone   Aleve [Naproxen Sodium] Itching   Ceftin [Cefuroxime Axetil] Other (See Comments)    unsure   Chlorhexidine    Depo-Provera [Medroxyprogesterone Acetate] Other (See Comments)    Severe acne   Doxycycline Other (See Comments)    Raises blood pressure    Oxycodone Nausea And Vomiting   Tramadol Other (See Comments)    Kept her up all night    Antimicrobials this admission: Cefepime 11/29 >>  Metronidazole 11/29 >>   Dose adjustments this admission: N/A  Microbiology results: 11/29 BCx: pending 11/29 Stool Cx: pending 11/29 C diff PCR: pending  Thank you for allowing pharmacy to  be a part of this patient's care.  Luisa Hart, PharmD, BCPS Clinical Pharmacist 04/11/2022 10:06 PM   Please refer to [facility] for pharmacy phone number

## 2022-04-11 NOTE — H&P (Signed)
History and Physical      Vicki [name] CBU:[id number] DOB: 1965-06-26 DOA: 04/11/2022  PCP: Pcp, No *** Patient coming from: home ***  I have personally briefly reviewed patient's old medical records in [place]  Chief Complaint: ***  HPI: Vicki [name] is a 56 y.o. female with medical history significant for *** who is admitted to [facility] on 04/11/2022 with *** after presenting from home*** to [facility] ED complaining of ***.    ***       ***   ED Course:  Vital signs in the ED were notable for the following: ***  Labs were notable for the following: ***  Per my interpretation, EKG in ED demonstrated the following:  ***  Imaging and additional notable ED work-up: ***  While in the ED, the following were administered: ***  Subsequently, the patient was admitted  ***  ***red    Review of Systems: As per HPI otherwise 10 point review of systems negative.   Past Medical History:  Diagnosis Date   Allergy    seasonal allergies   Anxiety    on meds   Arthritis    hands   Asthma    childhood-current   CAP (community acquired pneumonia) 04/07/2019   Carpal tunnel syndrome    had bilateral sx to tx   Cataract    bilateral--no sx yet   COPD (chronic obstructive pulmonary disease) ([place])    Depression    on meds   Diverticulitis    Emphysema of lung ([place])    uses MDI   Essential hypertension, benign 07/14/2019   on meds   Hyperlipidemia    on meds   Oxygen deficiency    home O2 at 2.5L/[place] nightly   Oxygen dependent    uses O2 at 2.5L/[place] nightly    Past Surgical History:  Procedure Laterality Date   ABDOMINAL HYSTERECTOMY  2009   APPENDECTOMY     ARTHROSCOPIC REPAIR ACL Left 2011/2014   x 2   BIOPSY  01/21/2020   Procedure: BIOPSY;  Surgeon: Mauri Pole, MD;  Location: [facility];  Service: Endoscopy;;  EGD and COLON   BUNIONECTOMY  2006   both   CARPAL TUNNEL RELEASE Bilateral 10/28/2013   Procedure:  BILATERAL CARPAL TUNNEL RELEASE;  Surgeon: Wynonia Sours, MD;  Location: [place];  Service: Orthopedics;  Laterality: Bilateral;   CHOLECYSTECTOMY  1994   CHONDROPLASTY Right 08/31/2020   Procedure: CHONDROPLASTY;  Surgeon: Earlie Server, MD;  Location: [place];  Service: Orthopedics;  Laterality: Right;   COLONOSCOPY WITH PROPOFOL N/A 01/21/2020   Procedure: COLONOSCOPY WITH PROPOFOL;  Surgeon: Mauri Pole, MD;  Location: [facility];  Service: Endoscopy;  Laterality: N/A;   DILATION AND CURETTAGE OF UTERUS     ESOPHAGOGASTRODUODENOSCOPY (EGD) WITH PROPOFOL N/A 01/21/2020   Procedure: ESOPHAGOGASTRODUODENOSCOPY (EGD) WITH PROPOFOL;  Surgeon: Mauri Pole, MD;  Location: [facility];  Service: Endoscopy;  Laterality: N/A;   KNEE ARTHROSCOPY WITH ANTERIOR CRUCIATE LIGAMENT (ACL) REPAIR Right 08/31/2020   Procedure: RIGHT KNEE ARTHROSCOPY WITH ANTERIOR CRUCIATE LIGAMENT (ACL) REPAIR MEDIAL AND LATERAL  MENISECTOMY;  Surgeon: Earlie Server, MD;  Location: [place];  Service: Orthopedics;  Laterality: Right;  FEMORAL NERVE BLOCK   POLYPECTOMY  01/21/2020   Procedure: POLYPECTOMY;  Surgeon: Mauri Pole, MD;  Location: [facility];  Service: Endoscopy;;   TUBAL LIGATION  2004    Social History:  reports that she has  been smoking cigarettes. She has a 34.00 pack-year smoking history. She has never used smokeless tobacco. She reports current alcohol use. She reports that she does not use drugs.   Allergies  Allergen Reactions   Jasmine Oil Shortness Of Breath and Swelling   Levaquin [Levofloxacin] Shortness Of Breath, Itching, Swelling and Rash   Penicillins Anaphylaxis and Hives    Tolerated ceftriaxone   Aleve [Naproxen Sodium] Itching   Ceftin [Cefuroxime Axetil] Other (See Comments)    unsure   Chlorhexidine    Depo-Provera [Medroxyprogesterone Acetate] Other (See Comments)    Severe acne   Doxycycline Other (See  Comments)    Raises blood pressure    Oxycodone Nausea And Vomiting   Tramadol Other (See Comments)    Kept her up all night    Family History  Problem Relation Age of Onset   Brain cancer Mother 31   Lung cancer Mother 2   Breast cancer Sister 42   Ovarian cancer Sister 52   Colon cancer Neg Hx    Colon polyps Neg Hx    Esophageal cancer Neg Hx    Rectal cancer Neg Hx    Stomach cancer Neg Hx     Family history reviewed and not pertinent ***   Prior to Admission medications   Medication Sig Start Date End Date Taking? Authorizing Provider  acetaminophen (TYLENOL) 500 MG tablet Take 1,000 mg by mouth every 6 (six) hours as needed for moderate pain.    [provider]  albuterol (VENTOLIN HFA) 108 (90 Base) MCG/ACT inhaler INHALE 2 PUFFS INTO THE LUNGS TWICE A DAY Patient taking differently: Inhale 2 puffs into the lungs every 4 (four) hours. 11/20/21   Maximiano Coss, NP  ALPRAZolam Duanne [place]on) 1 MG tablet TAKE 1 TABLET BY MOUTH THREE TIMES A DAY AS NEEDED FOR ANXIETY Patient taking differently: Take 1 mg by mouth 3 (three) times daily as needed for anxiety. 04/03/22   Wendie Agreste, MD  amLODipine (NORVASC) 5 MG tablet Take 1 tablet (5 mg total) by mouth daily. 07/07/21   Maximiano Coss, NP  ASPIRIN LOW DOSE 81 MG EC tablet TAKE 1 TABLET BY MOUTH 2 TIMES DAILY. Patient taking differently: Take 81 mg by mouth in the morning and at bedtime. 03/21/21   Maximiano Coss, NP  azelastine (ASTELIN) 0.1 % nasal spray Place 1 spray into both nostrils 2 (two) times daily. Use in each nostril as directed Patient taking differently: Place 1 spray into both nostrils daily. 07/07/21   Maximiano Coss, NP  azithromycin (ZITHROMAX) 500 MG tablet Take 1 tablet (500 mg total) by mouth daily. 04/09/22   Patrecia Pour, MD  chlorpheniramine-HYDROcodone (TUSSIONEX) 10-8 MG/5ML Take 5 mLs by mouth every 12 (twelve) hours as needed for cough. 04/09/22   Patrecia Pour, MD  citalopram (CELEXA) 20  MG tablet Take 60 mg daily Patient not taking: Reported on 04/05/2022 02/01/22   Midge Minium, MD  EPINEPHrine (EPIPEN 2-PAK) 0.3 mg/0.3 mL IJ SOAJ injection Inject 0.3 mg into the muscle as needed for anaphylaxis. 12/27/20   Maximiano Coss, NP  famotidine (PEPCID) 20 MG tablet TAKE 1 TABLET BY MOUTH TWICE A DAY (MORNING AND NIGHT) Patient taking differently: Take 20 mg by mouth 2 (two) times daily at 8 am and 10 pm. 11/07/20   Valentina Shaggy, MD  gabapentin (NEURONTIN) 300 MG capsule Take 1 capsule (300 mg total) by mouth 2 (two) times daily. 07/07/21   Maximiano Coss, NP  hydrOXYzine (ATARAX) 25  MG tablet TAKE 0.5-1 TABLETS (12.5-25 MG TOTAL) BY MOUTH 3 (THREE) TIMES DAILY AS NEEDED FOR ANXIETY. 09/18/21   Maximiano Coss, NP  ibuprofen (ADVIL) 600 MG tablet Take 1 tablet (600 mg total) by mouth every 6 (six) hours as needed (severe pain). 04/09/22   Patrecia Pour, MD  ipratropium-albuterol (DUONEB) 0.5-2.5 (3) MG/3ML SOLN Use 1 vial by nebulization in the morning and at bedtime. 10/10/21   Maximiano Coss, NP  Magnesium Citrate 100 MG TABS Take 1 tablet by mouth at bedtime. Patient taking differently: Take 100 mg by mouth at bedtime. 10/10/21   Maximiano Coss, NP  meclizine (ANTIVERT) 25 MG tablet Take 1 tablet (25 mg total) by mouth 3 (three) times daily as needed for dizziness. 12/27/20   Maximiano Coss, NP  montelukast (SINGULAIR) 10 MG tablet Take 1 tablet (10 mg total) by mouth at bedtime. Patient not taking: Reported on 04/05/2022 07/07/21   Maximiano Coss, NP  omeprazole (PRILOSEC) 20 MG capsule TAKE 1 CAPSULE BY MOUTH TWICE A DAY Patient taking differently: Take 20 mg by mouth daily. 07/10/21   Maximiano Coss, NP  ondansetron (ZOFRAN) 8 MG tablet Take 1 tablet (8 mg total) by mouth every 8 (eight) hours as needed for nausea or vomiting. 12/06/21   Maximiano Coss, NP  potassium chloride SA (KLOR-CON M) 20 MEQ tablet Take 1 tablet (20 mEq total) by mouth daily. 10/10/21   Maximiano Coss, NP  prazosin (MINIPRESS) 1 MG capsule TAKE 1 TO 2 CAPSULES (1 TO 2 MG TOTAL) BY MOUTH AT BEDTIME. Patient taking differently: Take 1-2 mg by mouth at bedtime. 12/27/20   Maximiano Coss, NP  predniSONE (DELTASONE) 10 MG tablet Take 4 tablets (40 mg total) by mouth daily with breakfast for 3 days, THEN 2 tablets (20 mg total) daily with breakfast for 3 days, THEN 1 tablet (10 mg total) daily with breakfast for 3 days. 04/10/22 04/19/22  Patrecia Pour, MD  promethazine (PHENERGAN) 25 MG tablet Take 1 tablet (25 mg total) by mouth every 8 (eight) hours as needed. Patient taking differently: Take 25 mg by mouth every 8 (eight) hours as needed for nausea or vomiting. 12/06/21   Maximiano Coss, NP  rizatriptan (MAXALT) 10 MG tablet TAKE 1 TABLET BY MOUTH AS NEEDED FOR MIGRAINE. MAY REPEAT IN 2 HOURS IF NEEDED Patient taking differently: Take 5 mg by mouth 2 (two) times daily as needed for migraine. 10/17/21   Maximiano Coss, NP  rOPINIRole (REQUIP) 2 MG tablet Take 0.5-1 tablets (1-2 mg total) by mouth at bedtime. 10/10/21   Maximiano Coss, NP  STIOLTO RESPIMAT 2.5-2.5 MCG/ACT AERS Inhale 1 puff into the lungs daily. 11/20/21   Maximiano Coss, NP  traZODone (DESYREL) 50 MG tablet Take 1 tablet (50 mg total) by mouth at bedtime. 07/07/21   Maximiano Coss, NP     Objective    Physical Exam: Vitals:   04/11/22 2231 04/11/22 2245 04/11/22 2313 04/11/22 2314  BP: 1'05/62 97/62 97/64 '$   Pulse:  93 89 88  Resp:   (!) 22 13  Temp:      TempSrc:      SpO2:  92% 91% 93%  Weight:      Height:        General: appears to be stated age; alert, oriented Skin: warm, dry, no rash Head:  AT/[place] Mouth:  Oral mucosa membranes appear moist, normal dentition Neck: supple; trachea midline Heart:  RRR; did not appreciate any M/R/G Lungs: CTAB, did not appreciate any  wheezes, rales, or rhonchi Abdomen: + BS; soft, ND, NT Vascular: 2+ pedal pulses b/l; 2+ radial pulses b/l Extremities: no peripheral edema,  no muscle wasting Neuro: strength and sensation intact in upper and lower extremities b/l ***   *** Neuro: 5/5 strength of the proximal and distal flexors and extensors of the upper and lower extremities bilaterally; sensation intact in upper and lower extremities b/l; cranial nerves II through XII grossly intact; no pronator drift; no evidence suggestive of slurred speech, dysarthria, or facial droop; Normal muscle tone. No tremors.  *** Neuro: In the setting of the patient's current mental status and associated inability to follow instructions, unable to perform full neurologic exam at this time.  As such, assessment of strength, sensation, and cranial nerves is limited at this time. Patient noted to spontaneously move all 4 extremities. No tremors.  ***    Labs on Admission: I have personally reviewed following labs and imaging studies  CBC: Recent Labs  Lab 04/05/22 1336 04/05/22 1353 04/06/22 0144 04/07/22 0152 04/08/22 0622 04/11/22 1947  WBC 14.1*  --  9.3 14.7* 10.2 30.7*  NEUTROABS  --   --   --  11.9*  --   --   HGB 16.2* 17.3* 13.9 13.6 13.9 14.9  HCT 46.6* 51.0* 41.5 40.6 42.3 43.0  MCV 98.5  --  98.8 101.5* 103.9* 97.1  PLT 261  --  223 250 283 591*   Basic Metabolic Panel: Recent Labs  Lab 04/05/22 1336 04/05/22 1353 04/06/22 0144 04/07/22 0152 04/08/22 0622 04/11/22 1947  NA 136 134* 137 137 138 135  K 3.2* 3.1* 3.6 3.8 3.8 3.8  CL 92*  --  93* 97* 100 91*  CO2 29  --  '29 29 28 27  '$ GLUCOSE 126*  --  210* 139* 104* 99  BUN <5*  --  15 25* 25* 10  CREATININE 0.74  --  1.23* 0.94 1.00 0.79  CALCIUM 8.1*  --  8.1* 8.0* 8.2* 8.5*  MG  --   --   --  1.7 1.5*  --   PHOS  --   --   --  3.1 3.2  --    GFR: Estimated Creatinine Clearance: 72.3 mL/min (by C-G formula based on SCr of 0.79 mg/dL). Liver Function Tests: Recent Labs  Lab 04/05/22 1336 04/06/22 0144 04/07/22 0152 04/11/22 1947  AST '30 21 16 25  '$ ALT '17 14 13 '$ 34  ALKPHOS 146* 122 113 92   BILITOT 0.9 0.2* 0.3 0.9  PROT 6.8 5.7* 5.5* 6.5  ALBUMIN 3.0* 2.4* 2.4* 2.9*   Recent Labs  Lab 04/11/22 1947  LIPASE 30   No results for input(s): "AMMONIA" in the last 168 hours. Coagulation Profile: Recent Labs  Lab 04/05/22 1430  INR 1.1   Cardiac Enzymes: No results for input(s): "CKTOTAL", "CKMB", "CKMBINDEX", "TROPONINI" in the last 168 hours. BNP (last 3 results) No results for input(s): "PROBNP" in the last 8760 hours. HbA1C: No results for input(s): "HGBA1C" in the last 72 hours. CBG: No results for input(s): "GLUCAP" in the last 168 hours. Lipid Profile: No results for input(s): "CHOL", "HDL", "LDLCALC", "TRIG", "CHOLHDL", "LDLDIRECT" in the last 72 hours. Thyroid Function Tests: No results for input(s): "TSH", "T4TOTAL", "FREET4", "T3FREE", "THYROIDAB" in the last 72 hours. Anemia Panel: No results for input(s): "VITAMINB12", "FOLATE", "FERRITIN", "TIBC", "IRON", "RETICCTPCT" in the last 72 hours. Urine analysis:    Component Value Date/Time   COLORURINE YELLOW 04/06/2022 1213   APPEARANCEUR HAZY (A) 04/06/2022  [place] (A) 04/23/2019 1141   LABSPEC 1.021 04/06/2022 1213   PHURINE 5.0 04/06/2022 1213   GLUCOSEU NEGATIVE 04/06/2022 1213   GLUCOSEU NEGATIVE 08/18/2020 1346   HGBUR NEGATIVE 04/06/2022 [place] 04/06/2022 1213   BILIRUBINUR negative 07/14/2019 1342   BILIRUBINUR Negative 04/23/2019 1141   KETONESUR NEGATIVE 04/06/2022 1213   PROTEINUR 30 (A) 04/06/2022 1213   UROBILINOGEN 0.2 08/18/2020 1346   NITRITE NEGATIVE 04/06/2022 1213   LEUKOCYTESUR NEGATIVE 04/06/2022 1213    Radiological Exams on Admission: CT ABDOMEN PELVIS W CONTRAST  Result Date: 04/11/2022 CLINICAL DATA:  Acute abdominal pain and diarrhea, initial encounter EXAM: CT ABDOMEN AND PELVIS WITH CONTRAST TECHNIQUE: Multidetector CT imaging of the abdomen and pelvis was performed using the standard protocol following bolus administration of  intravenous contrast. RADIATION DOSE REDUCTION: This exam was performed according to the departmental dose-optimization program which includes automated exposure control, adjustment of the mA and/or kV according to patient size and/or use of iterative reconstruction technique. CONTRAST:  65m OMNIPAQUE IOHEXOL 350 MG/ML SOLN COMPARISON:  04/26/2021 FINDINGS: Lower chest: Lung bases are well aerated. Lingular scarring is noted. Slightly more organized nodular density is noted in the lateral aspect of the left lung base measuring 12 x 7 mm. Mild airspace opacity is noted in the posterior aspect of the right lung base consistent with resolving pneumonia. Hepatobiliary: No focal liver abnormality is seen. Status post cholecystectomy. No biliary dilatation. Pancreas: Unremarkable. No pancreatic ductal dilatation or surrounding inflammatory changes. Spleen: Normal in size without focal abnormality. Adrenals/Urinary Tract: Adrenal glands are within normal limits. Kidneys demonstrate a normal enhancement pattern bilaterally. No renal calculi are seen. No obstructive changes are noted. The bladder is decompressed. Stomach/Bowel: Diverticular change of the colon is noted with pericolonic inflammatory change and mild free fluid in the pelvis consistent with diverticulitis. No focal abscess is seen. More proximal colon is unremarkable. The appendix is within normal limits. Small bowel and within normal limits. Vascular/Lymphatic: Aortic atherosclerosis. No enlarged abdominal or pelvic lymph nodes. Reproductive: Status post hysterectomy. No adnexal masses. Other: Minimal free fluid is noted in the pelvis reactive to the inflammatory changes from diverticulitis. Again no abscess is noted. Musculoskeletal: Degenerative changes of lumbar spine are noted. IMPRESSION: Diverticulitis of the sigmoid colon without evidence of abscess formation. No definitive perforation is seen. Resolving infiltrate in the right base. 10 mm left solid  pulmonary nodule detected on incomplete chest CT. Per Fleischner Society Guidelines, recommend prompt non-contrast Chest CT for further evaluation. These guidelines do not apply to immunocompromised patients and patients with cancer. Follow up in patients with significant comorbidities as clinically warranted. For lung cancer screening, adhere to Lung-RADS guidelines. Reference: Radiology. 2017; 284(1):228-43. Electronically Signed   By: MInez CatalinaM.D.   On: 04/11/2022 21:48      Assessment/Plan   Principal Problem:   Acute diverticulitis   ***       ***            ***             ***            ***            ***            ***             ***           ***           ***   ***  DVT prophylaxis: SCD's ***  Code Status: Full code*** Family Communication: none*** Disposition Plan: Per Rounding Team Consults called: none***;  Admission status: ***     I SPENT GREATER THAN 75 *** MINUTES IN CLINICAL CARE TIME/MEDICAL DECISION-MAKING IN COMPLETING THIS ADMISSION.      [place] DO [facility]  From [place]   04/11/2022, 11:19 PM   ***

## 2022-04-12 ENCOUNTER — Encounter (HOSPITAL_COMMUNITY): Payer: Self-pay | Admitting: Internal Medicine

## 2022-04-12 DIAGNOSIS — E860 Dehydration: Secondary | ICD-10-CM | POA: Diagnosis present

## 2022-04-12 DIAGNOSIS — K5792 Diverticulitis of intestine, part unspecified, without perforation or abscess without bleeding: Secondary | ICD-10-CM | POA: Diagnosis not present

## 2022-04-12 DIAGNOSIS — J9611 Chronic respiratory failure with hypoxia: Secondary | ICD-10-CM | POA: Diagnosis not present

## 2022-04-12 DIAGNOSIS — J449 Chronic obstructive pulmonary disease, unspecified: Secondary | ICD-10-CM | POA: Diagnosis not present

## 2022-04-12 DIAGNOSIS — F411 Generalized anxiety disorder: Secondary | ICD-10-CM | POA: Diagnosis not present

## 2022-04-12 DIAGNOSIS — A419 Sepsis, unspecified organism: Secondary | ICD-10-CM | POA: Diagnosis present

## 2022-04-12 DIAGNOSIS — D72829 Elevated white blood cell count, unspecified: Secondary | ICD-10-CM | POA: Diagnosis present

## 2022-04-12 LAB — CBC WITH DIFFERENTIAL/PLATELET
Abs Immature Granulocytes: 0.21 10*3/uL — ABNORMAL HIGH (ref 0.00–0.07)
Basophils Absolute: 0.1 10*3/uL (ref 0.0–0.1)
Basophils Relative: 0 %
Eosinophils Absolute: 0 10*3/uL (ref 0.0–0.5)
Eosinophils Relative: 0 %
HCT: 41 % (ref 36.0–46.0)
Hemoglobin: 14.2 g/dL (ref 12.0–15.0)
Immature Granulocytes: 1 %
Lymphocytes Relative: 8 %
Lymphs Abs: 1.7 10*3/uL (ref 0.7–4.0)
MCH: 34 pg (ref 26.0–34.0)
MCHC: 34.6 g/dL (ref 30.0–36.0)
MCV: 98.1 fL (ref 80.0–100.0)
Monocytes Absolute: 1.4 10*3/uL — ABNORMAL HIGH (ref 0.1–1.0)
Monocytes Relative: 6 %
Neutro Abs: 17.8 10*3/uL — ABNORMAL HIGH (ref 1.7–7.7)
Neutrophils Relative %: 85 %
Platelets: 289 10*3/uL (ref 150–400)
RBC: 4.18 MIL/uL (ref 3.87–5.11)
RDW: 13.8 % (ref 11.5–15.5)
WBC: 21.2 10*3/uL — ABNORMAL HIGH (ref 4.0–10.5)
nRBC: 0 % (ref 0.0–0.2)

## 2022-04-12 LAB — URINALYSIS, ROUTINE W REFLEX MICROSCOPIC
Bilirubin Urine: NEGATIVE
Glucose, UA: NEGATIVE mg/dL
Hgb urine dipstick: NEGATIVE
Ketones, ur: 20 mg/dL — AB
Leukocytes,Ua: NEGATIVE
Nitrite: NEGATIVE
Protein, ur: NEGATIVE mg/dL
Specific Gravity, Urine: >1.046 — ABNORMAL HIGH (ref 1.005–1.030)
pH: 6 (ref 5.0–8.0)

## 2022-04-12 LAB — C DIFFICILE QUICK SCREEN W PCR REFLEX
C Diff antigen: POSITIVE — AB
C Diff interpretation: DETECTED
C Diff toxin: POSITIVE — AB

## 2022-04-12 LAB — GASTROINTESTINAL PANEL BY PCR, STOOL (REPLACES STOOL CULTURE)

## 2022-04-12 LAB — COMPREHENSIVE METABOLIC PANEL
ALT: 26 U/L (ref 0–44)
AST: 19 U/L (ref 15–41)
Albumin: 2.5 g/dL — ABNORMAL LOW (ref 3.5–5.0)
Alkaline Phosphatase: 88 U/L (ref 38–126)
Anion gap: 12 (ref 5–15)
BUN: 10 mg/dL (ref 6–20)
CO2: 31 mmol/L (ref 22–32)
Calcium: 8 mg/dL — ABNORMAL LOW (ref 8.9–10.3)
Chloride: 94 mmol/L — ABNORMAL LOW (ref 98–111)
Creatinine, Ser: 0.79 mg/dL (ref 0.44–1.00)
GFR, Estimated: >60 mL/min (ref >60)
Glucose, Bld: 82 mg/dL (ref 70–99)
Potassium: 3.8 mmol/L (ref 3.5–5.1)
Sodium: 137 mmol/L (ref 135–145)
Total Bilirubin: 1.1 mg/dL (ref 0.3–1.2)
Total Protein: 5.6 g/dL — ABNORMAL LOW (ref 6.5–8.1)

## 2022-04-12 LAB — MAGNESIUM: Magnesium: 1.2 mg/dL — ABNORMAL LOW (ref 1.7–2.4)

## 2022-04-12 MED ORDER — ALBUTEROL SULFATE (2.5 MG/3ML) 0.083% IN NEBU: 2.5000 mg | INHALATION_SOLUTION | RESPIRATORY_TRACT | Status: DC | PRN

## 2022-04-12 MED ORDER — FENTANYL CITRATE PF 50 MCG/ML IJ SOSY
50.0000 ug | PREFILLED_SYRINGE | INTRAMUSCULAR | Status: DC | PRN
  Administered 2022-04-12 – 2022-04-13 (×8): 50 ug via INTRAVENOUS
  Filled 2022-04-12 (×8): qty 1

## 2022-04-12 MED ORDER — ALPRAZOLAM 0.25 MG PO TABS: 1.0000 mg | ORAL_TABLET | Freq: Three times a day (TID) | ORAL | Status: DC | PRN

## 2022-04-12 MED ORDER — PREDNISONE 20 MG PO TABS
40.0000 mg | ORAL_TABLET | Freq: Every day | ORAL | Status: AC
  Administered 2022-04-12: 40 mg via ORAL
  Filled 2022-04-12: qty 2

## 2022-04-12 MED ORDER — SODIUM CHLORIDE 0.9 % IV SOLN
2.0000 g | INTRAVENOUS | Status: AC
  Administered 2022-04-12 – 2022-04-15 (×4): 2 g via INTRAVENOUS
  Filled 2022-04-12 (×4): qty 20

## 2022-04-12 MED ORDER — HYDROXYZINE HCL 25 MG PO TABS: 12.5000 mg | ORAL_TABLET | Freq: Three times a day (TID) | ORAL | Status: DC | PRN

## 2022-04-12 MED ORDER — PRAZOSIN HCL 1 MG PO CAPS
1.0000 mg | ORAL_CAPSULE | Freq: Every evening | ORAL | Status: DC | PRN
  Administered 2022-04-12: 1 mg via ORAL
  Filled 2022-04-12: qty 1

## 2022-04-12 MED ORDER — BENZONATATE 100 MG PO CAPS: 200.0000 mg | ORAL_CAPSULE | Freq: Three times a day (TID) | ORAL | Status: DC | PRN

## 2022-04-12 MED ORDER — FENTANYL CITRATE PF 50 MCG/ML IJ SOSY
50.0000 ug | PREFILLED_SYRINGE | Freq: Once | INTRAMUSCULAR | Status: AC
  Administered 2022-04-12: 50 ug via INTRAVENOUS
  Filled 2022-04-12: qty 1

## 2022-04-12 MED ORDER — INFLUENZA VAC SPLIT QUAD 0.5 ML IM SUSY
0.5000 mL | PREFILLED_SYRINGE | INTRAMUSCULAR | Status: DC
  Filled 2022-04-12: qty 0.5

## 2022-04-12 MED ORDER — MAGNESIUM SULFATE 4 GM/100ML IV SOLN
4.0000 g | Freq: Once | INTRAVENOUS | Status: AC
  Administered 2022-04-12: 4 g via INTRAVENOUS
  Filled 2022-04-12: qty 100

## 2022-04-12 MED ORDER — ROPINIROLE HCL 1 MG PO TABS
1.0000 mg | ORAL_TABLET | Freq: Every day | ORAL | Status: DC
  Administered 2022-04-12 – 2022-04-15 (×4): 1 mg via ORAL
  Filled 2022-04-12 (×5): qty 1

## 2022-04-12 MED ORDER — BUDESONIDE 0.25 MG/2ML IN SUSP
0.2500 mg | Freq: Two times a day (BID) | RESPIRATORY_TRACT | Status: DC
  Administered 2022-04-12 – 2022-04-16 (×9): 0.25 mg via RESPIRATORY_TRACT
  Filled 2022-04-12 (×11): qty 2

## 2022-04-12 MED ORDER — ARFORMOTEROL TARTRATE 15 MCG/2ML IN NEBU
15.0000 ug | INHALATION_SOLUTION | Freq: Two times a day (BID) | RESPIRATORY_TRACT | Status: DC
  Administered 2022-04-12 – 2022-04-16 (×9): 15 ug via RESPIRATORY_TRACT
  Filled 2022-04-12 (×11): qty 2

## 2022-04-12 MED ORDER — TRAZODONE HCL 50 MG PO TABS
50.0000 mg | ORAL_TABLET | Freq: Every day | ORAL | Status: DC
  Administered 2022-04-12 – 2022-04-15 (×4): 50 mg via ORAL
  Filled 2022-04-12 (×4): qty 1

## 2022-04-12 MED ORDER — PNEUMOCOCCAL VAC POLYVALENT 25 MCG/0.5ML IJ INJ
0.5000 mL | INJECTION | INTRAMUSCULAR | Status: DC
  Filled 2022-04-12 (×2): qty 0.5

## 2022-04-12 MED ORDER — OXYCODONE HCL 5 MG PO TABS
5.0000 mg | ORAL_TABLET | Freq: Four times a day (QID) | ORAL | Status: DC | PRN
  Administered 2022-04-12: 5 mg via ORAL
  Filled 2022-04-12: qty 1

## 2022-04-12 MED ORDER — PRAZOSIN HCL 1 MG PO CAPS
1.0000 mg | ORAL_CAPSULE | Freq: Every day | ORAL | Status: DC
  Administered 2022-04-12 – 2022-04-15 (×4): 1 mg via ORAL
  Filled 2022-04-12 (×5): qty 1

## 2022-04-12 MED ORDER — ENOXAPARIN SODIUM 40 MG/0.4ML IJ SOSY
40.0000 mg | PREFILLED_SYRINGE | INTRAMUSCULAR | Status: DC
  Administered 2022-04-12 – 2022-04-15 (×4): 40 mg via SUBCUTANEOUS
  Filled 2022-04-12 (×4): qty 0.4

## 2022-04-12 MED ORDER — VANCOMYCIN HCL 125 MG PO CAPS
125.0000 mg | ORAL_CAPSULE | Freq: Four times a day (QID) | ORAL | Status: DC
  Administered 2022-04-12 – 2022-04-16 (×17): 125 mg via ORAL
  Filled 2022-04-12 (×20): qty 1

## 2022-04-12 MED ORDER — GABAPENTIN 300 MG PO CAPS
300.0000 mg | ORAL_CAPSULE | Freq: Two times a day (BID) | ORAL | Status: DC
  Administered 2022-04-12 – 2022-04-16 (×9): 300 mg via ORAL
  Filled 2022-04-12 (×9): qty 1

## 2022-04-12 MED ORDER — PREDNISONE 20 MG PO TABS
20.0000 mg | ORAL_TABLET | Freq: Every day | ORAL | Status: AC
  Administered 2022-04-13 – 2022-04-15 (×3): 20 mg via ORAL
  Filled 2022-04-12 (×3): qty 1

## 2022-04-12 MED ORDER — REVEFENACIN 175 MCG/3ML IN SOLN
175.0000 ug | Freq: Every day | RESPIRATORY_TRACT | Status: DC
  Administered 2022-04-12 – 2022-04-16 (×5): 175 ug via RESPIRATORY_TRACT
  Filled 2022-04-12 (×6): qty 3

## 2022-04-12 MED ORDER — ALPRAZOLAM 0.5 MG PO TABS
0.5000 mg | ORAL_TABLET | Freq: Three times a day (TID) | ORAL | Status: DC | PRN
  Administered 2022-04-14 – 2022-04-15 (×3): 0.5 mg via ORAL
  Filled 2022-04-12 (×3): qty 1

## 2022-04-12 MED ORDER — IPRATROPIUM-ALBUTEROL 0.5-2.5 (3) MG/3ML IN SOLN
3.0000 mL | Freq: Three times a day (TID) | RESPIRATORY_TRACT | Status: DC
  Administered 2022-04-12 – 2022-04-16 (×8): 3 mL via RESPIRATORY_TRACT
  Filled 2022-04-12 (×8): qty 3

## 2022-04-12 MED ORDER — NICOTINE 21 MG/24HR TD PT24: 21.0000 mg | MEDICATED_PATCH | Freq: Every day | TRANSDERMAL | Status: DC | PRN

## 2022-04-12 MED ORDER — IPRATROPIUM-ALBUTEROL 0.5-2.5 (3) MG/3ML IN SOLN
3.0000 mL | Freq: Four times a day (QID) | RESPIRATORY_TRACT | Status: DC
  Administered 2022-04-12: 3 mL via RESPIRATORY_TRACT
  Filled 2022-04-12: qty 3

## 2022-04-12 MED ORDER — PREDNISONE 10 MG PO TABS
10.0000 mg | ORAL_TABLET | Freq: Every day | ORAL | Status: DC
  Administered 2022-04-16: 10 mg via ORAL
  Filled 2022-04-12: qty 1

## 2022-04-12 NOTE — ED Notes (Signed)
Pt sat staying at 88%, switched from 5L [place] to NRB. Will continue to monitor

## 2022-04-12 NOTE — ED Notes (Signed)
Pt removed from NRB and transitioned back to 5L [place], pt not tolerating well, sat of 85%. Respiratory consulted and at bedside to initiate high flow [place].

## 2022-04-12 NOTE — Progress Notes (Addendum)
PROGRESS NOTE        PATIENT DETAILS Name: Vicki [name] Age: 56 y.o. Sex: female Date of Birth: 22-Oct-1965 Admit Date: 04/11/2022 Admitting Physician Rhetta Mura, DO PCP:Pcp, No  Brief Summary: Patient is a 56 y.o.  female with history of COPD-on home O2-5 L/min-just discharged from this facility on 11/27-presenting with lower abdominal pain/diarrhea-found to have diverticulitis/C. difficile colitis.  Please see below for further details.  Significant events: 11/23-11/27>> hospitalization for AECOPD due to PNA. 11/29>> admit to [facility]-diverticulitis/possible C. difficile colitis.  Significant studies: 11/29>> CT abdomen/pelvis: Diverticulitis of sigmoid colon.  Resolving infiltrate right base.    Significant microbiology data: 11/29>> blood culture: No growth 11/30>> blood culture: Pending 11/29>> stool C. difficile antigen/toxin: Positive 11/29>> stool GI pathogen panel: Pending  Procedures: None  Consults: None  Subjective: Claims she is unchanged-continues to have significant pain in the lower abdomen.  Had 3 or 4 loose bowel movements overnight.  Objective: Vitals: Blood pressure 104/73, pulse 88, temperature 97.9 F (36.6 C), temperature source Oral, resp. rate (!) 22, height '5\' 5"'$  (1.651 m), weight 68 kg, SpO2 95 %.   Exam: Gen Exam:Alert awake-not in any distress HEENT:atraumatic, normocephalic Chest: B/L clear to auscultation anteriorly CVS:S1S2 regular Abdomen: Soft-moderate tenderness in the lower abdomen-some voluntary guarding. Extremities:no edema Neurology: Non focal Skin: no rash  Pertinent Labs/Radiology:    Latest Ref Rng & Units 04/12/2022    4:45 AM 04/11/2022    7:47 PM 04/08/2022    6:22 AM  CBC  WBC 4.0 - 10.5 K/uL 21.2  30.7  10.2   Hemoglobin 12.0 - 15.0 g/dL 14.2  14.9  13.9   Hematocrit 36.0 - 46.0 % 41.0  43.0  42.3   Platelets 150 - 400 K/uL 289  401  283     Lab Results  Component Value  Date   NA 137 04/12/2022   K 3.8 04/12/2022   CL 94 (L) 04/12/2022   CO2 31 04/12/2022      Assessment/Plan: Sepsis Acute sigmoid diverticulitis C. difficile colitis Continues to have significant lower abdominal pain/diarrhea Leukocytosis slowly downtrending Hemodynamically stable Likely has C. difficile colitis-involving some diverticula Continue n.p.o. status for today Start oral vanco to cover C. Difficile Narrow antibiotics to Rocephin/Flagyl to cover diverticulitis-we will plan a short course of treatment. Follow clinically-if deteriorates-needs repeat imaging to make sure she does not have collection/perforation.  COPD Chronic hypoxic respiratory failure on 5 L of oxygen at home No respiratory complaints this morning-feels that she is at baseline Continue bronchodilators  Hypomagnesemia Due to GI loss Replete/recheck  HTN Stable-without use of antihypertensives Resume amlodipine when able  Depression Anxiety Insomnia Continue trazodone/hydroxyzine/Xanax (half of home dose)/prazosin/Requip/Neurontin  Nondisplaced fractures of lateral right fourth/fifth ribs Seen on recent x-rays on 11/25 (prior hospitalization) Supportive care-encourage incentive spirometry   Incidental finding 10 mm solitary left pulmonary nodule Will need PCCM follow-up as outpatient  BMI: Estimated body mass index is 24.95 kg/m as calculated from the following:   Height as of this encounter: '5\' 5"'$  (1.651 m).   Weight as of this encounter: 68 kg.   Code status:   Code Status: Full Code   DVT Prophylaxis: enoxaparin (LOVENOX) injection 40 mg Start: 04/12/22 0945 SCDs Start: 04/11/22 2318    Family Communication: Lynford Humphrey- [phone] updated 11/30   Disposition Plan: Status is: Inpatient Remains inpatient appropriate  because: C. difficile colitis/diverticulitis-not yet stable for discharge.   Planned Discharge Destination:Home health   Diet: Diet Order              Diet NPO time specified Except for: Sips with Meds, Ice Chips  Diet effective now                     Antimicrobial agents: Anti-infectives (From admission, onward)    Start     Dose/Rate Route Frequency Ordered Stop   04/12/22 1000  vancomycin (VANCOCIN) capsule 125 mg        125 mg Oral 4 times daily 04/12/22 0713 04/22/22 0959   04/11/22 2230  ceFEPIme (MAXIPIME) 2 g in sodium chloride 0.9 % 100 mL IVPB        2 g 200 mL/hr over 30 Minutes Intravenous Every 8 hours 04/11/22 2202     04/11/22 2230  metroNIDAZOLE (FLAGYL) IVPB 500 mg        500 mg 100 mL/hr over 60 Minutes Intravenous Every 12 hours 04/11/22 2202          MEDICATIONS: Scheduled Meds:  arformoterol  15 mcg Nebulization BID   budesonide (PULMICORT) nebulizer solution  0.25 mg Nebulization BID   enoxaparin (LOVENOX) injection  40 mg Subcutaneous Q24H   gabapentin  300 mg Oral BID   ipratropium-albuterol  3 mL Nebulization TID   prazosin  1-2 mg Oral QHS   [START ON 04/13/2022] predniSONE  20 mg Oral Q breakfast   And   [START ON 04/16/2022] predniSONE  10 mg Oral Q breakfast   revefenacin  175 mcg Nebulization Daily   rOPINIRole  1 mg Oral QHS   traZODone  50 mg Oral QHS   vancomycin  125 mg Oral QID   Continuous Infusions:  ceFEPime (MAXIPIME) IV Stopped (04/12/22 0558)   lactated ringers 150 mL/hr at 04/11/22 2227   magnesium sulfate bolus IVPB 4 g (04/12/22 0756)   metronidazole Stopped (04/11/22 2327)   PRN Meds:.acetaminophen **OR** acetaminophen, albuterol, ALPRAZolam, benzonatate, fentaNYL (SUBLIMAZE) injection, hydrOXYzine, melatonin, naLOXone (NARCAN)  injection, nicotine, ondansetron (ZOFRAN) IV, oxyCODONE   I have personally reviewed following labs and imaging studies  LABORATORY DATA: CBC: Recent Labs  Lab 04/06/22 0144 04/07/22 0152 04/08/22 0622 04/11/22 1947 04/12/22 0445  WBC 9.3 14.7* 10.2 30.7* 21.2*  NEUTROABS  --  11.9*  --   --  17.8*  HGB 13.9 13.6 13.9 14.9 14.2   HCT 41.5 40.6 42.3 43.0 41.0  MCV 98.8 101.5* 103.9* 97.1 98.1  PLT 223 250 283 401* 127    Basic Metabolic Panel: Recent Labs  Lab 04/06/22 0144 04/07/22 0152 04/08/22 0622 04/11/22 1947 04/12/22 0445  NA 137 137 138 135 137  K 3.6 3.8 3.8 3.8 3.8  CL 93* 97* 100 91* 94*  CO2 '29 29 28 27 31  '$ GLUCOSE 210* 139* 104* 99 82  BUN 15 25* 25* 10 10  CREATININE 1.23* 0.94 1.00 0.79 0.79  CALCIUM 8.1* 8.0* 8.2* 8.5* 8.0*  MG  --  1.7 1.5*  --  1.2*  PHOS  --  3.1 3.2  --   --     GFR: Estimated Creatinine Clearance: 72.3 mL/min (by C-G formula based on SCr of 0.79 mg/dL).  Liver Function Tests: Recent Labs  Lab 04/05/22 1336 04/06/22 0144 04/07/22 0152 04/11/22 1947 04/12/22 0445  AST '30 21 16 25 19  '$ ALT '17 14 13 '$ 34 26  ALKPHOS 146* 122 113 92 88  BILITOT 0.9 0.2* 0.3 0.9 1.1  PROT 6.8 5.7* 5.5* 6.5 5.6*  ALBUMIN 3.0* 2.4* 2.4* 2.9* 2.5*   Recent Labs  Lab 04/11/22 1947  LIPASE 30   No results for input(s): "AMMONIA" in the last 168 hours.  Coagulation Profile: Recent Labs  Lab 04/05/22 1430  INR 1.1    Cardiac Enzymes: No results for input(s): "CKTOTAL", "CKMB", "CKMBINDEX", "TROPONINI" in the last 168 hours.  BNP (last 3 results) No results for input(s): "PROBNP" in the last 8760 hours.  Lipid Profile: No results for input(s): "CHOL", "HDL", "LDLCALC", "TRIG", "CHOLHDL", "LDLDIRECT" in the last 72 hours.  Thyroid Function Tests: No results for input(s): "TSH", "T4TOTAL", "FREET4", "T3FREE", "THYROIDAB" in the last 72 hours.  Anemia Panel: No results for input(s): "VITAMINB12", "FOLATE", "FERRITIN", "TIBC", "IRON", "RETICCTPCT" in the last 72 hours.  Urine analysis:    Component Value Date/Time   COLORURINE YELLOW 04/12/2022 0058   APPEARANCEUR CLEAR 04/12/2022 0058   APPEARANCEUR Cloudy (A) 04/23/2019 1141   LABSPEC >1.046 (H) 04/12/2022 0058   PHURINE 6.0 04/12/2022 0058   GLUCOSEU NEGATIVE 04/12/2022 0058   GLUCOSEU NEGATIVE 08/18/2020  1346   HGBUR NEGATIVE 04/12/2022 0058   BILIRUBINUR NEGATIVE 04/12/2022 0058   BILIRUBINUR negative 07/14/2019 1342   BILIRUBINUR Negative 04/23/2019 1141   KETONESUR 20 (A) 04/12/2022 0058   PROTEINUR NEGATIVE 04/12/2022 0058   UROBILINOGEN 0.2 08/18/2020 1346   NITRITE NEGATIVE 04/12/2022 0058   LEUKOCYTESUR NEGATIVE 04/12/2022 0058    Sepsis Labs: Lactic Acid, Venous    Component Value Date/Time   LATICACIDVEN 0.7 04/11/2022 2159    MICROBIOLOGY: Recent Results (from the past 240 hour(s))  Resp Panel by RT-PCR (Flu A&B, Covid) Anterior Nasal Swab     Status: None   Collection Time: 04/05/22  1:51 PM   Specimen: Anterior Nasal Swab  Result Value Ref Range Status   SARS Coronavirus 2 by RT PCR NEGATIVE NEGATIVE Final    Comment: (NOTE) SARS-CoV-2 target nucleic acids are NOT DETECTED.  The SARS-CoV-2 RNA is generally detectable in upper respiratory specimens during the acute phase of infection. The lowest concentration of SARS-CoV-2 viral copies this assay can detect is 138 copies/mL. A negative result does not preclude SARS-Cov-2 infection and should not be used as the sole basis for treatment or other patient management decisions. A negative result may occur with  improper specimen collection/handling, submission of specimen other than nasopharyngeal swab, presence of viral mutation(s) within the areas targeted by this assay, and inadequate number of viral copies(<138 copies/mL). A negative result must be combined with clinical observations, patient history, and epidemiological information. The expected result is Negative.  Fact Sheet for Patients:  EntrepreneurPulse.com.au  Fact Sheet for Healthcare Providers:  IncredibleEmployment.be  This test is no t yet approved or cleared by the [place] FDA and  has been authorized for detection and/or diagnosis of SARS-CoV-2 by FDA under an Emergency Use Authorization (EUA). This EUA  will remain  in effect (meaning this test can be used) for the duration of the COVID-19 declaration under Section 564(b)(1) of the Act, 21 U.S.C.section 360bbb-3(b)(1), unless the authorization is terminated  or revoked sooner.       Influenza A by PCR NEGATIVE NEGATIVE Final   Influenza B by PCR NEGATIVE NEGATIVE Final    Comment: (NOTE) The Xpert Xpress SARS-CoV-2/FLU/RSV plus assay is intended as an aid in the diagnosis of influenza from Nasopharyngeal swab specimens and should not be used as a sole basis for treatment. Nasal washings and  aspirates are unacceptable for Xpert Xpress SARS-CoV-2/FLU/RSV testing.  Fact Sheet for Patients: EntrepreneurPulse.com.au  Fact Sheet for Healthcare Providers: IncredibleEmployment.be  This test is not yet approved or cleared by the [place] FDA and has been authorized for detection and/or diagnosis of SARS-CoV-2 by FDA under an Emergency Use Authorization (EUA). This EUA will remain in effect (meaning this test can be used) for the duration of the COVID-19 declaration under Section 564(b)(1) of the Act, 21 U.S.C. section 360bbb-3(b)(1), unless the authorization is terminated or revoked.  Performed at [facility], [place] [address], [place], [place] [address]   Blood Culture (routine x 2)     Status: None   Collection Time: 04/05/22  1:51 PM   Specimen: BLOOD  Result Value Ref Range Status   Specimen Description BLOOD LEFT ANTECUBITAL  Final   Special Requests   Final    BOTTLES DRAWN AEROBIC AND ANAEROBIC Blood Culture results may not be optimal due to an excessive volume of blood received in culture bottles   Culture   Final    NO GROWTH 5 DAYS Performed at [facility], [place] [address] [place], [place] [address]    Report Status 04/10/2022 FINAL  Final  Urine Culture     Status: Abnormal   Collection Time: 04/05/22  1:51 PM   Specimen: In/Out Cath Urine  Result Value Ref Range  Status   Specimen Description IN/OUT CATH URINE  Final   Special Requests   Final    NONE Performed at [facility], [place] [address] [place], [place] [address]    Culture (A)  Final    30,000 COLONIES/mL ESCHERICHIA COLI Confirmed Extended Spectrum Beta-Lactamase Producer (ESBL).  In bloodstream infections from ESBL organisms, carbapenems are preferred over piperacillin/tazobactam. They are shown to have a lower risk of mortality.    Report Status 04/08/2022 FINAL  Final   Organism ID, Bacteria ESCHERICHIA COLI (A)  Final      Susceptibility   Escherichia coli - MIC*    AMPICILLIN >=32 RESISTANT Resistant     CEFAZOLIN >=64 RESISTANT Resistant     CEFEPIME 16 RESISTANT Resistant     CEFTRIAXONE >=64 RESISTANT Resistant     CIPROFLOXACIN 0.5 INTERMEDIATE Intermediate     GENTAMICIN <=1 SENSITIVE Sensitive     IMIPENEM <=0.25 SENSITIVE Sensitive     NITROFURANTOIN <=16 SENSITIVE Sensitive     TRIMETH/SULFA >=320 RESISTANT Resistant     AMPICILLIN/SULBACTAM 16 INTERMEDIATE Intermediate     PIP/TAZO <=4 SENSITIVE Sensitive     * 30,000 COLONIES/mL ESCHERICHIA COLI  Blood Culture (routine x 2)     Status: None   Collection Time: 04/05/22  1:56 PM   Specimen: BLOOD RIGHT FOREARM  Result Value Ref Range Status   Specimen Description BLOOD RIGHT FOREARM  Final   Special Requests   Final    BOTTLES DRAWN AEROBIC AND ANAEROBIC Blood Culture results may not be optimal due to an excessive volume of blood received in culture bottles   Culture   Final    NO GROWTH 5 DAYS Performed at [facility], [address], [place], [place] [address]    Report Status 04/10/2022 FINAL  Final  Respiratory (~20 pathogens) panel by PCR     Status: None   Collection Time: 04/06/22  8:30 AM   Specimen: Nasopharyngeal Swab; Respiratory  Result Value Ref Range Status   Adenovirus NOT DETECTED NOT DETECTED Final   Coronavirus 229E NOT DETECTED NOT  DETECTED Final    Comment: (NOTE) The  Coronavirus on the Respiratory Panel, DOES NOT test for the novel  Coronavirus (2019 nCoV)    Coronavirus HKU1 NOT DETECTED NOT DETECTED Final   Coronavirus NL63 NOT DETECTED NOT DETECTED Final   Coronavirus OC43 NOT DETECTED NOT DETECTED Final   Metapneumovirus NOT DETECTED NOT DETECTED Final   Rhinovirus / Enterovirus NOT DETECTED NOT DETECTED Final   Influenza A NOT DETECTED NOT DETECTED Final   Influenza B NOT DETECTED NOT DETECTED Final   Parainfluenza Virus 1 NOT DETECTED NOT DETECTED Final   Parainfluenza Virus 2 NOT DETECTED NOT DETECTED Final   Parainfluenza Virus 3 NOT DETECTED NOT DETECTED Final   Parainfluenza Virus 4 NOT DETECTED NOT DETECTED Final   Respiratory Syncytial Virus NOT DETECTED NOT DETECTED Final   Bordetella pertussis NOT DETECTED NOT DETECTED Final   Bordetella Parapertussis NOT DETECTED NOT DETECTED Final   Chlamydophila pneumoniae NOT DETECTED NOT DETECTED Final   Mycoplasma pneumoniae NOT DETECTED NOT DETECTED Final    Comment: Performed at [facility], [place] [address], [place], [place] [address]  Expectorated Sputum Assessment w Gram Stain, Rflx to Resp Cult     Status: None   Collection Time: 04/06/22  4:50 PM   Specimen: Expectorated Sputum  Result Value Ref Range Status   Specimen Description EXPECTORATED SPUTUM  Final   Special Requests NONE  Final   Sputum evaluation   Final    THIS SPECIMEN IS ACCEPTABLE FOR SPUTUM CULTURE Performed at [facility], [place] [address], [place], [place] [address]    Report Status 04/07/2022 FINAL  Final  Culture, Respiratory w Gram Stain     Status: None   Collection Time: 04/06/22  4:50 PM  Result Value Ref Range Status   Specimen Description EXPECTORATED SPUTUM  Final   Special Requests NONE Reflexed from [id number]  Final   Gram Stain   Final    MODERATE WBC PRESENT, PREDOMINANTLY PMN FEW GRAM POSITIVE COCCI IN PAIRS RARE YEAST    Culture   Final    RARE Normal respiratory flora-no Staph aureus or  Pseudomonas seen Performed at [facility], [place] [address] [place], [place] [address]    Report Status 04/09/2022 FINAL  Final  MRSA Next Gen by PCR, Nasal     Status: None   Collection Time: 04/06/22  4:52 PM   Specimen: Nasal Mucosa; Nasal Swab  Result Value Ref Range Status   MRSA by PCR Next Gen NOT DETECTED NOT DETECTED Final    Comment: (NOTE) The GeneXpert MRSA Assay (FDA approved for NASAL specimens only), is one component of a comprehensive MRSA colonization surveillance program. It is not intended to diagnose MRSA infection nor to guide or monitor treatment for MRSA infections. Test performance is not FDA approved in patients less than 3 years old. Performed at [facility], [place] [address] [place], [place] [address]   C Difficile Quick Screen w PCR reflex     Status: Abnormal   Collection Time: 04/11/22  7:47 PM   Specimen: Stool  Result Value Ref Range Status   C Diff antigen POSITIVE (A) NEGATIVE Final   C Diff toxin POSITIVE (A) NEGATIVE Final   C Diff interpretation Toxin producing C. difficile detected.  Final    Comment: RESULT CALLED TO, READ BACK BY AND VERIFIED WITH: J FERRAINOLA 04/12/22 0014 BY AB Performed at [facility], [place] [address], [place], [place] [address]  Blood culture (routine x 2)     Status: None (Preliminary result)   Collection Time: 04/11/22  9:59 PM   Specimen: BLOOD  Result Value Ref Range Status   Specimen Description BLOOD SITE NOT SPECIFIED  Final   Special Requests   Final    BOTTLES DRAWN AEROBIC AND ANAEROBIC Blood Culture results may not be optimal due to an inadequate volume of blood received in culture bottles   Culture   Final    NO GROWTH < 12 HOURS Performed at [facility], [address], [place],  [address]    Report Status PENDING  Incomplete    RADIOLOGY STUDIES/RESULTS: CT ABDOMEN PELVIS W CONTRAST  Result Date: 04/11/2022 CLINICAL DATA:  Acute abdominal pain and diarrhea,  initial encounter EXAM: CT ABDOMEN AND PELVIS WITH CONTRAST TECHNIQUE: Multidetector CT imaging of the abdomen and pelvis was performed using the standard protocol following bolus administration of intravenous contrast. RADIATION DOSE REDUCTION: This exam was performed according to the departmental dose-optimization program which includes automated exposure control, adjustment of the mA and/or kV according to patient size and/or use of iterative reconstruction technique. CONTRAST:  13m OMNIPAQUE IOHEXOL 350 MG/ML SOLN COMPARISON:  04/26/2021 FINDINGS: Lower chest: Lung bases are well aerated. Lingular scarring is noted. Slightly more organized nodular density is noted in the lateral aspect of the left lung base measuring 12 x 7 mm. Mild airspace opacity is noted in the posterior aspect of the right lung base consistent with resolving pneumonia. Hepatobiliary: No focal liver abnormality is seen. Status post cholecystectomy. No biliary dilatation. Pancreas: Unremarkable. No pancreatic ductal dilatation or surrounding inflammatory changes. Spleen: Normal in size without focal abnormality. Adrenals/Urinary Tract: Adrenal glands are within normal limits. Kidneys demonstrate a normal enhancement pattern bilaterally. No renal calculi are seen. No obstructive changes are noted. The bladder is decompressed. Stomach/Bowel: Diverticular change of the colon is noted with pericolonic inflammatory change and mild free fluid in the pelvis consistent with diverticulitis. No focal abscess is seen. More proximal colon is unremarkable. The appendix is within normal limits. Small bowel and within normal limits. Vascular/Lymphatic: Aortic atherosclerosis. No enlarged abdominal or pelvic lymph nodes. Reproductive: Status post hysterectomy. No adnexal masses. Other: Minimal free fluid is noted in the pelvis reactive to the inflammatory changes from diverticulitis. Again no abscess is noted. Musculoskeletal: Degenerative changes of lumbar  spine are noted. IMPRESSION: Diverticulitis of the sigmoid colon without evidence of abscess formation. No definitive perforation is seen. Resolving infiltrate in the right base. 10 mm left solid pulmonary nodule detected on incomplete chest CT. Per Fleischner Society Guidelines, recommend prompt non-contrast Chest CT for further evaluation. These guidelines do not apply to immunocompromised patients and patients with cancer. Follow up in patients with significant comorbidities as clinically warranted. For lung cancer screening, adhere to Lung-RADS guidelines. Reference: Radiology. 2017; 284(1):228-43. Electronically Signed   By: MInez CatalinaM.D.   On: 04/11/2022 21:48     LOS: 1 day   SOren Binet MD  [facility]    To contact the attending provider between 7A-7P or the covering provider during after hours 7P-7A, please log into the web site [email or web address] and access using universal [place] password for that web site. If you do not have the password, please call the hospital operator.  04/12/2022, 9:44 AM

## 2022-04-13 DIAGNOSIS — J9611 Chronic respiratory failure with hypoxia: Secondary | ICD-10-CM | POA: Diagnosis not present

## 2022-04-13 DIAGNOSIS — F411 Generalized anxiety disorder: Secondary | ICD-10-CM | POA: Diagnosis not present

## 2022-04-13 DIAGNOSIS — K5792 Diverticulitis of intestine, part unspecified, without perforation or abscess without bleeding: Secondary | ICD-10-CM | POA: Diagnosis not present

## 2022-04-13 DIAGNOSIS — J449 Chronic obstructive pulmonary disease, unspecified: Secondary | ICD-10-CM | POA: Diagnosis not present

## 2022-04-13 LAB — CBC WITH DIFFERENTIAL/PLATELET
Abs Immature Granulocytes: 0.13 10*3/uL — ABNORMAL HIGH (ref 0.00–0.07)
Basophils Absolute: 0 10*3/uL (ref 0.0–0.1)
Basophils Relative: 0 %
Eosinophils Absolute: 0 10*3/uL (ref 0.0–0.5)
Eosinophils Relative: 0 %
HCT: 35 % — ABNORMAL LOW (ref 36.0–46.0)
Hemoglobin: 12.1 g/dL (ref 12.0–15.0)
Immature Granulocytes: 1 %
Lymphocytes Relative: 9 %
Lymphs Abs: 1.4 10*3/uL (ref 0.7–4.0)
MCH: 33.4 pg (ref 26.0–34.0)
MCHC: 34.6 g/dL (ref 30.0–36.0)
MCV: 96.7 fL (ref 80.0–100.0)
Monocytes Absolute: 0.6 10*3/uL (ref 0.1–1.0)
Monocytes Relative: 4 %
Neutro Abs: 12.7 10*3/uL — ABNORMAL HIGH (ref 1.7–7.7)
Neutrophils Relative %: 86 %
Platelets: 282 10*3/uL (ref 150–400)
RBC: 3.62 MIL/uL — ABNORMAL LOW (ref 3.87–5.11)
RDW: 13.4 % (ref 11.5–15.5)
WBC: 14.9 10*3/uL — ABNORMAL HIGH (ref 4.0–10.5)
nRBC: 0 % (ref 0.0–0.2)

## 2022-04-13 LAB — BASIC METABOLIC PANEL
Anion gap: 11 (ref 5–15)
BUN: 10 mg/dL (ref 6–20)
CO2: 28 mmol/L (ref 22–32)
Calcium: 8.2 mg/dL — ABNORMAL LOW (ref 8.9–10.3)
Chloride: 97 mmol/L — ABNORMAL LOW (ref 98–111)
Creatinine, Ser: 0.67 mg/dL (ref 0.44–1.00)
GFR, Estimated: >60 mL/min (ref >60)
Glucose, Bld: 103 mg/dL — ABNORMAL HIGH (ref 70–99)
Potassium: 3.5 mmol/L (ref 3.5–5.1)
Sodium: 136 mmol/L (ref 135–145)

## 2022-04-13 LAB — MAGNESIUM: Magnesium: 1.9 mg/dL (ref 1.7–2.4)

## 2022-04-13 LAB — GLUCOSE, CAPILLARY: Glucose-Capillary: 207 mg/dL — ABNORMAL HIGH (ref 70–99)

## 2022-04-13 MED ORDER — OXYCODONE HCL 5 MG PO TABS
5.0000 mg | ORAL_TABLET | Freq: Four times a day (QID) | ORAL | Status: DC | PRN
  Administered 2022-04-13 (×2): 5 mg via ORAL
  Administered 2022-04-14 – 2022-04-15 (×3): 10 mg via ORAL
  Filled 2022-04-13: qty 2
  Filled 2022-04-13: qty 1
  Filled 2022-04-13: qty 2
  Filled 2022-04-13: qty 1
  Filled 2022-04-13 (×2): qty 2

## 2022-04-13 MED ORDER — MAGNESIUM SULFATE 2 GM/50ML IV SOLN
INTRAVENOUS | Status: AC
  Filled 2022-04-13: qty 50

## 2022-04-13 MED ORDER — MAGNESIUM SULFATE IN D5W 1-5 GM/100ML-% IV SOLN
1.0000 g | Freq: Once | INTRAVENOUS | Status: AC
  Administered 2022-04-13: 1 g via INTRAVENOUS
  Filled 2022-04-13: qty 100

## 2022-04-13 MED ORDER — SODIUM CHLORIDE 0.9 % IV SOLN: INTRAVENOUS | Status: DC | PRN

## 2022-04-13 MED ORDER — FENTANYL CITRATE PF 50 MCG/ML IJ SOSY
25.0000 ug | PREFILLED_SYRINGE | INTRAMUSCULAR | Status: DC | PRN
  Administered 2022-04-13 – 2022-04-14 (×3): 25 ug via INTRAVENOUS
  Filled 2022-04-13 (×3): qty 1

## 2022-04-13 MED ORDER — POTASSIUM CHLORIDE CRYS ER 20 MEQ PO TBCR
20.0000 meq | EXTENDED_RELEASE_TABLET | Freq: Once | ORAL | Status: AC
  Administered 2022-04-13: 20 meq via ORAL
  Filled 2022-04-13: qty 1

## 2022-04-13 NOTE — TOC Progression Note (Addendum)
Transition of Care [facility]) - Progression Note    Patient Details  Name: Vicki [name] MRN: [id number] Date of Birth: 10-Oct-1965  Transition of [facility]) CM/SW Contact  Zenon Mayo, RN Phone Number: 04/13/2022, 4:01 PM  Clinical Narrative:    From home, has 5 liters of oxygen at home, diverticulits, c diff, iv abx, po abx, copd, fentanyl q2, NCM offered choice for HHPT, she has no preference.  NCM made referral to [facility] with Alvis Lemmings, he is able to take referral for HHPT.  Soc will begin 24 to 48 hrs post dc.  Patient states she goes to [facility] in [place] and her MD is not there any more but she would like to see Dr. Nyoka Cowden. Secretary tried to get her a follow up there but they were not taking any more patients, so will get her a follow up with a [facility] clinic.        Expected Discharge Plan and Services                                                 Social Determinants of Health (SDOH) Interventions    Readmission Risk Interventions     No data to display

## 2022-04-13 NOTE — Progress Notes (Addendum)
PROGRESS NOTE        PATIENT DETAILS Name: Vicki [name] Age: 56 y.o. Sex: female Date of Birth: Jan 25, 1966 Admit Date: 04/11/2022 Admitting Physician Rhetta Mura, DO PCP:Pcp, No  Brief Summary: Patient is a 56 y.o.  female with history of COPD-on home O2-5 L/min-just discharged from this facility on 11/27-presenting with lower abdominal pain/diarrhea-found to have diverticulitis/C. difficile colitis.  Please see below for further details.  Significant events: 11/23-11/27>> hospitalization for AECOPD due to PNA. 11/29>> admit to [facility]-diverticulitis/C. difficile colitis.  Significant studies: 11/29>> CT abdomen/pelvis: Diverticulitis of sigmoid colon.  Resolving infiltrate right base.    Significant microbiology data: 11/29>> blood culture: No growth 11/30>> blood culture: No growth 11/29>> stool C. difficile antigen/toxin: Positive 11/29>> stool GI pathogen panel: Neg  Procedures: None  Consults: None  Subjective: Significantly better-more awake/alert-no diarrhea overnight.  Acknowledges significant improvement in diarrhea and lower abdominal pain.  Objective: Vitals: Blood pressure 91/61, pulse 74, temperature 97.6 F (36.4 C), temperature source Oral, resp. rate 18, height '5\' 3"'$  (1.6 m), weight 76.3 kg, SpO2 93 %.   Exam: Gen Exam:Alert awake-not in any distress HEENT:atraumatic, normocephalic Chest: B/L clear to auscultation anteriorly CVS:S1S2 regular Abdomen:soft-hardly any lower abdominal tenderness. Extremities:no edema Neurology: Non focal Skin: no rash  Pertinent Labs/Radiology:    Latest Ref Rng & Units 04/13/2022   12:51 AM 04/12/2022    4:45 AM 04/11/2022    7:47 PM  CBC  WBC 4.0 - 10.5 K/uL 14.9  21.2  30.7   Hemoglobin 12.0 - 15.0 g/dL 12.1  14.2  14.9   Hematocrit 36.0 - 46.0 % 35.0  41.0  43.0   Platelets 150 - 400 K/uL 282  289  401     Lab Results  Component Value Date   NA 136 04/13/2022   K 3.5  04/13/2022   CL 97 (L) 04/13/2022   CO2 28 04/13/2022      Assessment/Plan: Sepsis Acute sigmoid diverticulitis C. difficile colitis Significant improvement in sepsis physiology overnight  Diarrhea/lower abdominal pain markedly better  Leukocytosis downtrending  On Rocephin/Flagyl and oral vancomycin Plan is to complete at least 5-7 days of treatment for acute diverticulitis, and extend oral vancomycin for 7 days after completing treatment for acute diverticulitis. Starting clear liquids today-advance slowly over the next few days. Patient counseled that we will need to minimize antibiotics as much as possible in the future  COPD Chronic hypoxic respiratory failure on 5 L of oxygen at home No respiratory complaints this morning-feels that she is at baseline Continue bronchodilators  Hypomagnesemia Due to GI loss Repleted.  HTN Stable/soft-without use of antihypertensives Resume amlodipine when able  Depression Anxiety Insomnia Continue trazodone/hydroxyzine/Xanax (half of home dose)/prazosin/Requip/Neurontin  Nondisplaced fractures of lateral right fourth/fifth ribs Seen on recent x-rays on 11/25 (prior hospitalization) Supportive care-encourage incentive spirometry  Incidental finding 10 mm solitary left pulmonary nodule Will need PCCM follow-up as outpatient  BMI: Estimated body mass index is 29.8 kg/m as calculated from the following:   Height as of this encounter: '5\' 3"'$  (1.6 m).   Weight as of this encounter: 76.3 kg.   Code status:   Code Status: Full Code   DVT Prophylaxis: enoxaparin (LOVENOX) injection 40 mg Start: 04/12/22 1000 SCDs Start: 04/11/22 2318    Family Communication: Lynford Humphrey- [phone] updated 12/1   Disposition Plan: Status is: Inpatient Remains  inpatient appropriate because: C. difficile colitis/diverticulitis-not yet stable for discharge.   Planned Discharge Destination:Home health   Diet: Diet Order              Diet clear liquid Room service appropriate? Yes; Fluid consistency: Thin  Diet effective now                     Antimicrobial agents: Anti-infectives (From admission, onward)    Start     Dose/Rate Route Frequency Ordered Stop   04/12/22 1000  vancomycin (VANCOCIN) capsule 125 mg        125 mg Oral 4 times daily 04/12/22 0713 04/22/22 0959   04/12/22 1000  cefTRIAXone (ROCEPHIN) 2 g in sodium chloride 0.9 % 100 mL IVPB        2 g 200 mL/hr over 30 Minutes Intravenous Every 24 hours 04/12/22 0952 04/16/22 0959   04/11/22 2230  ceFEPIme (MAXIPIME) 2 g in sodium chloride 0.9 % 100 mL IVPB  Status:  Discontinued        2 g 200 mL/hr over 30 Minutes Intravenous Every 8 hours 04/11/22 2202 04/12/22 0952   04/11/22 2230  metroNIDAZOLE (FLAGYL) IVPB 500 mg        500 mg 100 mL/hr over 60 Minutes Intravenous Every 12 hours 04/11/22 2202 04/16/22 0950        MEDICATIONS: Scheduled Meds:  arformoterol  15 mcg Nebulization BID   budesonide (PULMICORT) nebulizer solution  0.25 mg Nebulization BID   enoxaparin (LOVENOX) injection  40 mg Subcutaneous Q24H   gabapentin  300 mg Oral BID   influenza vac split quadrivalent PF  0.5 mL Intramuscular Tomorrow-1000   ipratropium-albuterol  3 mL Nebulization TID   pneumococcal 23 valent vaccine  0.5 mL Intramuscular Tomorrow-1000   prazosin  1 mg Oral QHS   predniSONE  20 mg Oral Q breakfast   And   [START ON 04/16/2022] predniSONE  10 mg Oral Q breakfast   revefenacin  175 mcg Nebulization Daily   rOPINIRole  1 mg Oral QHS   traZODone  50 mg Oral QHS   vancomycin  125 mg Oral QID   Continuous Infusions:  cefTRIAXone (ROCEPHIN)  IV 2 g (04/13/22 1051)   magnesium sulfate bolus IVPB 1 g (04/13/22 1146)   metronidazole 500 mg (04/13/22 0902)   PRN Meds:.acetaminophen **OR** acetaminophen, albuterol, ALPRAZolam, benzonatate, fentaNYL (SUBLIMAZE) injection, hydrOXYzine, melatonin, naLOXone (NARCAN)  injection, nicotine, ondansetron (ZOFRAN)  IV, oxyCODONE, prazosin   I have personally reviewed following labs and imaging studies  LABORATORY DATA: CBC: Recent Labs  Lab 04/07/22 0152 04/08/22 0622 04/11/22 1947 04/12/22 0445 04/13/22 0051  WBC 14.7* 10.2 30.7* 21.2* 14.9*  NEUTROABS 11.9*  --   --  17.8* 12.7*  HGB 13.6 13.9 14.9 14.2 12.1  HCT 40.6 42.3 43.0 41.0 35.0*  MCV 101.5* 103.9* 97.1 98.1 96.7  PLT 250 283 401* 289 282     Basic Metabolic Panel: Recent Labs  Lab 04/07/22 0152 04/08/22 0622 04/11/22 1947 04/12/22 0445 04/13/22 0051  NA 137 138 135 137 136  K 3.8 3.8 3.8 3.8 3.5  CL 97* 100 91* 94* 97*  CO2 '29 28 27 31 28  '$ GLUCOSE 139* 104* 99 82 103*  BUN 25* 25* '10 10 10  '$ CREATININE 0.94 1.00 0.79 0.79 0.67  CALCIUM 8.0* 8.2* 8.5* 8.0* 8.2*  MG 1.7 1.5*  --  1.2* 1.9  PHOS 3.1 3.2  --   --   --  GFR: Estimated Creatinine Clearance: 78.7 mL/min (by C-G formula based on SCr of 0.67 mg/dL).  Liver Function Tests: Recent Labs  Lab 04/07/22 0152 04/11/22 1947 04/12/22 0445  AST '16 25 19  '$ ALT 13 34 26  ALKPHOS 113 92 88  BILITOT 0.3 0.9 1.1  PROT 5.5* 6.5 5.6*  ALBUMIN 2.4* 2.9* 2.5*    Recent Labs  Lab 04/11/22 1947  LIPASE 30    No results for input(s): "AMMONIA" in the last 168 hours.  Coagulation Profile: No results for input(s): "INR", "PROTIME" in the last 168 hours.   Cardiac Enzymes: No results for input(s): "CKTOTAL", "CKMB", "CKMBINDEX", "TROPONINI" in the last 168 hours.  BNP (last 3 results) No results for input(s): "PROBNP" in the last 8760 hours.  Lipid Profile: No results for input(s): "CHOL", "HDL", "LDLCALC", "TRIG", "CHOLHDL", "LDLDIRECT" in the last 72 hours.  Thyroid Function Tests: No results for input(s): "TSH", "T4TOTAL", "FREET4", "T3FREE", "THYROIDAB" in the last 72 hours.  Anemia Panel: No results for input(s): "VITAMINB12", "FOLATE", "FERRITIN", "TIBC", "IRON", "RETICCTPCT" in the last 72 hours.  Urine analysis:    Component Value  Date/Time   COLORURINE YELLOW 04/12/2022 0058   APPEARANCEUR CLEAR 04/12/2022 0058   APPEARANCEUR Cloudy (A) 04/23/2019 1141   LABSPEC >1.046 (H) 04/12/2022 0058   PHURINE 6.0 04/12/2022 0058   GLUCOSEU NEGATIVE 04/12/2022 0058   GLUCOSEU NEGATIVE 08/18/2020 1346   HGBUR NEGATIVE 04/12/2022 0058   BILIRUBINUR NEGATIVE 04/12/2022 0058   BILIRUBINUR negative 07/14/2019 1342   BILIRUBINUR Negative 04/23/2019 1141   KETONESUR 20 (A) 04/12/2022 0058   PROTEINUR NEGATIVE 04/12/2022 0058   UROBILINOGEN 0.2 08/18/2020 1346   NITRITE NEGATIVE 04/12/2022 0058   LEUKOCYTESUR NEGATIVE 04/12/2022 0058    Sepsis Labs: Lactic Acid, Venous    Component Value Date/Time   LATICACIDVEN 0.7 04/11/2022 2159    MICROBIOLOGY: Recent Results (from the past 240 hour(s))  Resp Panel by RT-PCR (Flu A&B, Covid) Anterior Nasal Swab     Status: None   Collection Time: 04/05/22  1:51 PM   Specimen: Anterior Nasal Swab  Result Value Ref Range Status   SARS Coronavirus 2 by RT PCR NEGATIVE NEGATIVE Final    Comment: (NOTE) SARS-CoV-2 target nucleic acids are NOT DETECTED.  The SARS-CoV-2 RNA is generally detectable in upper respiratory specimens during the acute phase of infection. The lowest concentration of SARS-CoV-2 viral copies this assay can detect is 138 copies/mL. A negative result does not preclude SARS-Cov-2 infection and should not be used as the sole basis for treatment or other patient management decisions. A negative result may occur with  improper specimen collection/handling, submission of specimen other than nasopharyngeal swab, presence of viral mutation(s) within the areas targeted by this assay, and inadequate number of viral copies(<138 copies/mL). A negative result must be combined with clinical observations, patient history, and epidemiological information. The expected result is Negative.  Fact Sheet for Patients:  EntrepreneurPulse.com.au  Fact Sheet for  Healthcare Providers:  IncredibleEmployment.be  This test is no t yet approved or cleared by the [place] FDA and  has been authorized for detection and/or diagnosis of SARS-CoV-2 by FDA under an Emergency Use Authorization (EUA). This EUA will remain  in effect (meaning this test can be used) for the duration of the COVID-19 declaration under Section 564(b)(1) of the Act, 21 U.S.C.section 360bbb-3(b)(1), unless the authorization is terminated  or revoked sooner.       Influenza A by PCR NEGATIVE NEGATIVE Final   Influenza B by PCR  NEGATIVE NEGATIVE Final    Comment: (NOTE) The Xpert Xpress SARS-CoV-2/FLU/RSV plus assay is intended as an aid in the diagnosis of influenza from Nasopharyngeal swab specimens and should not be used as a sole basis for treatment. Nasal washings and aspirates are unacceptable for Xpert Xpress SARS-CoV-2/FLU/RSV testing.  Fact Sheet for Patients: EntrepreneurPulse.com.au  Fact Sheet for Healthcare Providers: IncredibleEmployment.be  This test is not yet approved or cleared by the [place] FDA and has been authorized for detection and/or diagnosis of SARS-CoV-2 by FDA under an Emergency Use Authorization (EUA). This EUA will remain in effect (meaning this test can be used) for the duration of the COVID-19 declaration under Section 564(b)(1) of the Act, 21 U.S.C. section 360bbb-3(b)(1), unless the authorization is terminated or revoked.  Performed at [facility], [place] [address], [place], [place] [address]   Blood Culture (routine x 2)     Status: None   Collection Time: 04/05/22  1:51 PM   Specimen: BLOOD  Result Value Ref Range Status   Specimen Description BLOOD LEFT ANTECUBITAL  Final   Special Requests   Final    BOTTLES DRAWN AEROBIC AND ANAEROBIC Blood Culture results may not be optimal due to an excessive volume of blood received in culture bottles   Culture   Final     NO GROWTH 5 DAYS Performed at [facility], [place] [address], [place], [place] [address]    Report Status 04/10/2022 FINAL  Final  Urine Culture     Status: Abnormal   Collection Time: 04/05/22  1:51 PM   Specimen: In/Out Cath Urine  Result Value Ref Range Status   Specimen Description IN/OUT CATH URINE  Final   Special Requests   Final    NONE Performed at [facility], [place] [address] [place], [place] [address]    Culture (A)  Final    30,000 COLONIES/mL ESCHERICHIA COLI Confirmed Extended Spectrum Beta-Lactamase Producer (ESBL).  In bloodstream infections from ESBL organisms, carbapenems are preferred over piperacillin/tazobactam. They are shown to have a lower risk of mortality.    Report Status 04/08/2022 FINAL  Final   Organism ID, Bacteria ESCHERICHIA COLI (A)  Final      Susceptibility   Escherichia coli - MIC*    AMPICILLIN >=32 RESISTANT Resistant     CEFAZOLIN >=64 RESISTANT Resistant     CEFEPIME 16 RESISTANT Resistant     CEFTRIAXONE >=64 RESISTANT Resistant     CIPROFLOXACIN 0.5 INTERMEDIATE Intermediate     GENTAMICIN <=1 SENSITIVE Sensitive     IMIPENEM <=0.25 SENSITIVE Sensitive     NITROFURANTOIN <=16 SENSITIVE Sensitive     TRIMETH/SULFA >=320 RESISTANT Resistant     AMPICILLIN/SULBACTAM 16 INTERMEDIATE Intermediate     PIP/TAZO <=4 SENSITIVE Sensitive     * 30,000 COLONIES/mL ESCHERICHIA COLI  Blood Culture (routine x 2)     Status: None   Collection Time: 04/05/22  1:56 PM   Specimen: BLOOD RIGHT FOREARM  Result Value Ref Range Status   Specimen Description BLOOD RIGHT FOREARM  Final   Special Requests   Final    BOTTLES DRAWN AEROBIC AND ANAEROBIC Blood Culture results may not be optimal due to an excessive volume of blood received in culture bottles   Culture   Final    NO GROWTH 5 DAYS Performed at [facility], [address] [place], [place] [address]    Report Status 04/10/2022 FINAL  Final  Respiratory (~20 pathogens) panel by  PCR     Status: None   Collection Time: 04/06/22  8:30 AM   Specimen: Nasopharyngeal Swab; Respiratory  Result Value Ref Range Status   Adenovirus NOT DETECTED NOT DETECTED Final   Coronavirus 229E NOT DETECTED NOT DETECTED Final    Comment: (NOTE) The Coronavirus on the Respiratory Panel, DOES NOT test for the novel  Coronavirus (2019 nCoV)    Coronavirus HKU1 NOT DETECTED NOT DETECTED Final   Coronavirus NL63 NOT DETECTED NOT DETECTED Final   Coronavirus OC43 NOT DETECTED NOT DETECTED Final   Metapneumovirus NOT DETECTED NOT DETECTED Final   Rhinovirus / Enterovirus NOT DETECTED NOT DETECTED Final   Influenza A NOT DETECTED NOT DETECTED Final   Influenza B NOT DETECTED NOT DETECTED Final   Parainfluenza Virus 1 NOT DETECTED NOT DETECTED Final   Parainfluenza Virus 2 NOT DETECTED NOT DETECTED Final   Parainfluenza Virus 3 NOT DETECTED NOT DETECTED Final   Parainfluenza Virus 4 NOT DETECTED NOT DETECTED Final   Respiratory Syncytial Virus NOT DETECTED NOT DETECTED Final   Bordetella pertussis NOT DETECTED NOT DETECTED Final   Bordetella Parapertussis NOT DETECTED NOT DETECTED Final   Chlamydophila pneumoniae NOT DETECTED NOT DETECTED Final   Mycoplasma pneumoniae NOT DETECTED NOT DETECTED Final    Comment: Performed at [facility], [place] [address], [place], [place] [address]  Expectorated Sputum Assessment w Gram Stain, Rflx to Resp Cult     Status: None   Collection Time: 04/06/22  4:50 PM   Specimen: Expectorated Sputum  Result Value Ref Range Status   Specimen Description EXPECTORATED SPUTUM  Final   Special Requests NONE  Final   Sputum evaluation   Final    THIS SPECIMEN IS ACCEPTABLE FOR SPUTUM CULTURE Performed at [facility], [place] [address] [place], [place] [address]    Report Status 04/07/2022 FINAL  Final  Culture, Respiratory w Gram Stain     Status: None   Collection Time: 04/06/22  4:50 PM  Result Value Ref Range Status   Specimen Description  EXPECTORATED SPUTUM  Final   Special Requests NONE Reflexed from [id number]  Final   Gram Stain   Final    MODERATE WBC PRESENT, PREDOMINANTLY PMN FEW GRAM POSITIVE COCCI IN PAIRS RARE YEAST    Culture   Final    RARE Normal respiratory flora-no Staph aureus or Pseudomonas seen Performed at [facility], [place] [address], [place], [place] [address]    Report Status 04/09/2022 FINAL  Final  MRSA Next Gen by PCR, Nasal     Status: None   Collection Time: 04/06/22  4:52 PM   Specimen: Nasal Mucosa; Nasal Swab  Result Value Ref Range Status   MRSA by PCR Next Gen NOT DETECTED NOT DETECTED Final    Comment: (NOTE) The GeneXpert MRSA Assay (FDA approved for NASAL specimens only), is one component of a comprehensive MRSA colonization surveillance program. It is not intended to diagnose MRSA infection nor to guide or monitor treatment for MRSA infections. Test performance is not FDA approved in patients less than 24 years old. Performed at [facility], [place] [address], [place], [place] [address]   C Difficile Quick Screen w PCR reflex     Status: Abnormal   Collection Time: 04/11/22  7:47 PM   Specimen: Stool  Result Value Ref Range Status   C Diff antigen POSITIVE (A) NEGATIVE Final   C Diff toxin POSITIVE (A) NEGATIVE Final   C Diff  interpretation Toxin producing C. difficile detected.  Final    Comment: RESULT CALLED TO, READ BACK BY AND VERIFIED WITH: J FERRAINOLA 04/12/22 0014 BY AB Performed at [facility], [place] [address] [place], [place] [address]   Gastrointestinal Panel by PCR , Stool     Status: None   Collection Time: 04/11/22  8:31 PM   Specimen: Stool  Result Value Ref Range Status   Campylobacter species NOT DETECTED NOT DETECTED Final   Plesimonas shigelloides NOT DETECTED NOT DETECTED Final   Salmonella species NOT DETECTED NOT DETECTED Final   Yersinia enterocolitica NOT DETECTED NOT DETECTED Final   Vibrio species NOT DETECTED NOT DETECTED Final    Vibrio cholerae NOT DETECTED NOT DETECTED Final   Enteroaggregative E coli (EAEC) NOT DETECTED NOT DETECTED Final   Enteropathogenic E coli (EPEC) NOT DETECTED NOT DETECTED Final   Enterotoxigenic E coli (ETEC) NOT DETECTED NOT DETECTED Final   Shiga like toxin producing E coli (STEC) NOT DETECTED NOT DETECTED Final   Shigella/Enteroinvasive E coli (EIEC) NOT DETECTED NOT DETECTED Final   Cryptosporidium NOT DETECTED NOT DETECTED Final   Cyclospora cayetanensis NOT DETECTED NOT DETECTED Final   Entamoeba histolytica NOT DETECTED NOT DETECTED Final   Giardia lamblia NOT DETECTED NOT DETECTED Final   Adenovirus F40/41 NOT DETECTED NOT DETECTED Final   Astrovirus NOT DETECTED NOT DETECTED Final   Norovirus GI/GII NOT DETECTED NOT DETECTED Final   Rotavirus A NOT DETECTED NOT DETECTED Final   Sapovirus (I, II, IV, and V) NOT DETECTED NOT DETECTED Final    Comment: Performed at [facility], [place] [place], [place] [address]  Blood culture (routine x 2)     Status: None (Preliminary result)   Collection Time: 04/11/22  9:59 PM   Specimen: BLOOD  Result Value Ref Range Status   Specimen Description BLOOD SITE NOT SPECIFIED  Final   Special Requests   Final    BOTTLES DRAWN AEROBIC AND ANAEROBIC Blood Culture results may not be optimal due to an inadequate volume of blood received in culture bottles   Culture   Final    NO GROWTH 2 DAYS Performed at [facility], [address], [place], [place] [address]    Report Status PENDING  Incomplete  Blood culture (routine x 2)     Status: None (Preliminary result)   Collection Time: 04/12/22  4:45 AM   Specimen: BLOOD  Result Value Ref Range Status   Specimen Description BLOOD SITE NOT SPECIFIED  Final   Special Requests   Final    BOTTLES DRAWN AEROBIC AND ANAEROBIC Blood Culture adequate volume   Culture   Final    NO GROWTH 1 DAY Performed at [facility], [place] [address], [place], [place] [address]    Report  Status PENDING  Incomplete    RADIOLOGY STUDIES/RESULTS: CT ABDOMEN PELVIS W CONTRAST  Result Date: 04/11/2022 CLINICAL DATA:  Acute abdominal pain and diarrhea, initial encounter EXAM: CT ABDOMEN AND PELVIS WITH CONTRAST TECHNIQUE: Multidetector CT imaging of the abdomen and pelvis was performed using the standard protocol following bolus administration of intravenous contrast. RADIATION DOSE REDUCTION: This exam was performed according to the departmental dose-optimization program which includes automated exposure control, adjustment of the mA and/or kV according to patient size and/or use of iterative reconstruction technique. CONTRAST:  30m OMNIPAQUE IOHEXOL 350 MG/ML SOLN COMPARISON:  04/26/2021 FINDINGS: Lower chest: Lung bases are well aerated. Lingular scarring is noted. Slightly more  organized nodular density is noted in the lateral aspect of the left lung base measuring 12 x 7 mm. Mild airspace opacity is noted in the posterior aspect of the right lung base consistent with resolving pneumonia. Hepatobiliary: No focal liver abnormality is seen. Status post cholecystectomy. No biliary dilatation. Pancreas: Unremarkable. No pancreatic ductal dilatation or surrounding inflammatory changes. Spleen: Normal in size without focal abnormality. Adrenals/Urinary Tract: Adrenal glands are within normal limits. Kidneys demonstrate a normal enhancement pattern bilaterally. No renal calculi are seen. No obstructive changes are noted. The bladder is decompressed. Stomach/Bowel: Diverticular change of the colon is noted with pericolonic inflammatory change and mild free fluid in the pelvis consistent with diverticulitis. No focal abscess is seen. More proximal colon is unremarkable. The appendix is within normal limits. Small bowel and within normal limits. Vascular/Lymphatic: Aortic atherosclerosis. No enlarged abdominal or pelvic lymph nodes. Reproductive: Status post hysterectomy. No adnexal masses. Other:  Minimal free fluid is noted in the pelvis reactive to the inflammatory changes from diverticulitis. Again no abscess is noted. Musculoskeletal: Degenerative changes of lumbar spine are noted. IMPRESSION: Diverticulitis of the sigmoid colon without evidence of abscess formation. No definitive perforation is seen. Resolving infiltrate in the right base. 10 mm left solid pulmonary nodule detected on incomplete chest CT. Per Fleischner Society Guidelines, recommend prompt non-contrast Chest CT for further evaluation. These guidelines do not apply to immunocompromised patients and patients with cancer. Follow up in patients with significant comorbidities as clinically warranted. For lung cancer screening, adhere to Lung-RADS guidelines. Reference: Radiology. 2017; 284(1):228-43. Electronically Signed   By: Inez Catalina M.D.   On: 04/11/2022 21:48     LOS: 2 days   Oren Binet, MD  [facility]    To contact the attending provider between 7A-7P or the covering provider during after hours 7P-7A, please log into the web site [email or web address] and access using universal [place] password for that web site. If you do not have the password, please call the hospital operator.  04/13/2022, 12:34 PM

## 2022-04-13 NOTE — Evaluation (Signed)
Physical Therapy Evaluation Patient Details Name: Vicki [name] MRN: [id number] DOB: 1965/08/20 Today's Date: 04/13/2022  History of Present Illness  Pt is a 56 y/o female adm 11/29 with Cdiff. PMhx: admission 11/23-27/23 for COPD exacerbation. COPD on home 5L, Emphysema, asthma, carpal tunnel with release  Clinical Impression  Pt pleasant and frustrated with incontinent stool. Pt with excoriated peri area and encouraged to be up to [facility] to void rather than use of purewick for skin integrity. Pt with decreased strength and activity tolerance related to repeat admissions and Cdiff who will benefit from acute therapy to maximize mobility and safety. Pt educated for transfers, progressive mobility and plan.  Encouraged OOB for meals.  BP 101/57, HR 100 SpO2 95% on 5L     Recommendations for follow up therapy are one component of a multi-disciplinary discharge planning process, led by the attending physician.  Recommendations may be updated based on patient status, additional functional criteria and insurance authorization.  Follow Up Recommendations Home health PT      Assistance Recommended at Discharge Intermittent Supervision/Assistance  Patient can return home with the following  Assist for transportation;Help with stairs or ramp for entrance;Assistance with cooking/housework;A little help with walking and/or transfers    Equipment Recommendations None recommended by PT  Recommendations for Other Services       Functional Status Assessment Patient has had a recent decline in their functional status and demonstrates the ability to make significant improvements in function in a reasonable and predictable amount of time.     Precautions / Restrictions Precautions Precautions: Fall Precaution Comments: 5L baseline, incontinent of stool inconsistently, excoriated peri area      Mobility  Bed Mobility Overal bed mobility: Needs Assistance Bed Mobility: Supine to Sit      Supine to sit: Supervision     General bed mobility comments: HOB 10 degrees with supervision for lines and increased time to transition to EOB. Pt with incontinent stool on arrival with max assist for pericare and linen change with pt able to bridge to remove briefs    Transfers Overall transfer level: Needs assistance   Transfers: Sit to/from Stand, Bed to chair/wheelchair/BSC Sit to Stand: Min guard Stand pivot transfers: Min guard         General transfer comment: minguard to rise from EOB and pivot to [facility] with cues for hand placement and assist for line management    Ambulation/Gait Ambulation/Gait assistance: Min guard Gait Distance (Feet): 12 Feet Assistive device: Rolling walker (2 wheels) Gait Pattern/deviations: Step-through pattern, Decreased stride length, Trunk flexed   Gait velocity interpretation: 1.31 - 2.62 ft/sec, indicative of limited community ambulator   General Gait Details: pt with reliance on RW to walk in room to chair. SPO2 90-94% on 5L with activity. Limited by fatigue  Stairs            Wheelchair Mobility    Modified Rankin (Stroke Patients Only)       Balance Overall balance assessment: Mild deficits observed, not formally tested                                           Pertinent Vitals/Pain Pain Assessment Pain Assessment: 0-10 Pain Score: 4  Pain Location: peri area - excoriation with pericare after toileting Pain Descriptors / Indicators: Sore Pain Intervention(s): Limited activity within patient's tolerance, Repositioned    Home Living Family/patient expects  to be discharged to:: Private residence Living Arrangements: Spouse/significant other;Children Available Help at Discharge: Family;Available 24 hours/day Type of Home: House Home Access: Stairs to enter Entrance Stairs-Rails: None Entrance Stairs-Number of Steps: 2   Home Layout: One level Home Equipment: Cane - single Barista (2  wheels)      Prior Function Prior Level of Function : Independent/Modified Independent;History of Falls (last six months)               ADLs Comments: modified independent, increased time. Pt reports showers took a long time and left her feeling tired/winded. pt drives short distances. pt getting out for errands on limited basis now.     Hand Dominance        Extremity/Trunk Assessment   Upper Extremity Assessment Upper Extremity Assessment: Generalized weakness    Lower Extremity Assessment Lower Extremity Assessment: Generalized weakness    Cervical / Trunk Assessment Cervical / Trunk Assessment: Kyphotic  Communication   Communication: No difficulties  Cognition Arousal/Alertness: Awake/alert Behavior During Therapy: WFL for tasks assessed/performed Overall Cognitive Status: Within Functional Limits for tasks assessed                                          General Comments      Exercises     Assessment/Plan    PT Assessment Patient needs continued PT services  PT Problem List Decreased activity tolerance;Decreased mobility;Cardiopulmonary status limiting activity       PT Treatment Interventions Gait training;Functional mobility training;Stair training;DME instruction;Therapeutic activities;Patient/family education;Therapeutic exercise    PT Goals (Current goals can be found in the Care Plan section)  Acute Rehab PT Goals Patient Stated Goal: be able to return home and walk PT Goal Formulation: With patient Time For Goal Achievement: 04/27/22 Potential to Achieve Goals: Good    Frequency Min 3X/week     Co-evaluation               AM-PAC PT "6 Clicks" Mobility  Outcome Measure Help needed turning from your back to your side while in a flat bed without using bedrails?: None Help needed moving from lying on your back to sitting on the side of a flat bed without using bedrails?: A Little Help needed moving to and from a  bed to a chair (including a wheelchair)?: A Little Help needed standing up from a chair using your arms (e.g., wheelchair or bedside chair)?: A Little Help needed to walk in hospital room?: A Lot Help needed climbing 3-5 steps with a railing? : A Lot 6 Click Score: 17    End of Session Equipment Utilized During Treatment: Oxygen Activity Tolerance: Patient tolerated treatment well Patient left: in chair;with call bell/phone within reach Nurse Communication: Mobility status PT Visit Diagnosis: Other abnormalities of gait and mobility (R26.89);Difficulty in walking, not elsewhere classified (R26.2)    Time: 4008-6761 PT Time Calculation (min) (ACUTE ONLY): 27 min   Charges:   PT Evaluation $PT Eval Moderate Complexity: 1 Mod PT Treatments $Therapeutic Activity: 8-22 mins        Bayard Males, PT Acute Rehabilitation Services Office: [phone]   Sandy Salaam Zoanne Newill 04/13/2022, 1:54 PM

## 2022-04-14 DIAGNOSIS — K5792 Diverticulitis of intestine, part unspecified, without perforation or abscess without bleeding: Secondary | ICD-10-CM | POA: Diagnosis not present

## 2022-04-14 LAB — BASIC METABOLIC PANEL
Anion gap: 12 (ref 5–15)
BUN: 8 mg/dL (ref 6–20)
CO2: 26 mmol/L (ref 22–32)
Calcium: 8 mg/dL — ABNORMAL LOW (ref 8.9–10.3)
Chloride: 90 mmol/L — ABNORMAL LOW (ref 98–111)
Creatinine, Ser: 0.66 mg/dL (ref 0.44–1.00)
GFR, Estimated: >60 mL/min (ref >60)
Glucose, Bld: 132 mg/dL — ABNORMAL HIGH (ref 70–99)
Potassium: 4.1 mmol/L (ref 3.5–5.1)
Sodium: 128 mmol/L — ABNORMAL LOW (ref 135–145)

## 2022-04-14 LAB — OSMOLALITY: Osmolality: 283 mOsm/kg (ref 275–295)

## 2022-04-14 LAB — CBC
HCT: 38.2 % (ref 36.0–46.0)
Hemoglobin: 13.2 g/dL (ref 12.0–15.0)
MCH: 33.8 pg (ref 26.0–34.0)
MCHC: 34.6 g/dL (ref 30.0–36.0)
MCV: 97.7 fL (ref 80.0–100.0)
Platelets: 296 10*3/uL (ref 150–400)
RBC: 3.91 MIL/uL (ref 3.87–5.11)
RDW: 13.4 % (ref 11.5–15.5)
WBC: 12.9 10*3/uL — ABNORMAL HIGH (ref 4.0–10.5)
nRBC: 0 % (ref 0.0–0.2)

## 2022-04-14 LAB — CREATININE, URINE, RANDOM: Creatinine, Urine: 15 mg/dL

## 2022-04-14 LAB — SODIUM, URINE, RANDOM: Sodium, Ur: 14 mmol/L

## 2022-04-14 LAB — MAGNESIUM: Magnesium: 1.6 mg/dL — ABNORMAL LOW (ref 1.7–2.4)

## 2022-04-14 LAB — URIC ACID: Uric Acid, Serum: 4.8 mg/dL (ref 2.5–7.1)

## 2022-04-14 MED ORDER — CAMPHOR-MENTHOL 0.5-0.5 % EX LOTN
TOPICAL_LOTION | CUTANEOUS | Status: DC | PRN
  Filled 2022-04-14 (×2): qty 222

## 2022-04-14 MED ORDER — LACTATED RINGERS IV SOLN: INTRAVENOUS | Status: AC

## 2022-04-14 MED ORDER — MAGNESIUM SULFATE 4 GM/100ML IV SOLN
4.0000 g | Freq: Once | INTRAVENOUS | Status: AC
  Administered 2022-04-14: 4 g via INTRAVENOUS
  Filled 2022-04-14: qty 100

## 2022-04-14 NOTE — Progress Notes (Signed)
PROGRESS NOTE        PATIENT DETAILS Name: Vicki [name] Age: 56 y.o. Sex: female Date of Birth: [birth year] Admit Date: 04/11/2022 Admitting Physician Rhetta Mura, DO PCP:Pcp, No  Brief Summary: Patient is a 56 y.o.  female with history of COPD-on home O2-5 L/min-just discharged from this facility on 11/27-presenting with lower abdominal pain/diarrhea-found to have diverticulitis/C. difficile colitis.  Please see below for further details.  Significant events: 11/23-11/27>> hospitalization for AECOPD due to PNA. 11/29>> admit to [facility]-diverticulitis/C. difficile colitis.  Significant studies: 11/29>> CT abdomen/pelvis: Diverticulitis of sigmoid colon.  Resolving infiltrate right base.    Significant microbiology data: 11/29>> blood culture: No growth 11/30>> blood culture: No growth 11/29>> stool C. difficile antigen/toxin: Positive 11/29>> stool GI pathogen panel: Neg  Procedures: None  Consults: None  Subjective:  Patient in bed, appears comfortable, denies any headache, no fever, no chest pain or pressure, no shortness of breath , no abdominal pain, diarrhea much better. No new focal weakness.   Objective: Vitals: Blood pressure 112/72, pulse 90, temperature 97.6 F (36.4 C), temperature source Oral, resp. rate 19, height '5\' 3"'$  (1.6 m), weight 79 kg, SpO2 90 %.   Exam:  Awake Alert, No new F.N deficits, Normal affect .AT,PERRAL Supple Neck, No JVD,   Symmetrical Chest wall movement, Good air movement bilaterally, CTAB RRR,No Gallops, Rubs or new Murmurs,  +ve B.Sounds, Abd Soft, No tenderness,   No Cyanosis, Clubbing or edema    Assessment/Plan: Sepsis - Acute sigmoid diverticulitis due to Acute C. difficile colitis - On PO Vancomycin, improving, advance diet and activity. Sepsis resolved.   Advanced COPD - Chronic hypoxic respiratory failure on 5 L of oxygen at home, stable.  Hypomagnesemia - Hyponatremia - Due to GI  loss, IVF and IV Mag.  HTN - Stable/soft-without use of antihypertensives, resume amlodipine when able  Depression, Anxiety & Insomnia - Continue trazodone/hydroxyzine/Xanax (half of home dose)/prazosin/Requip/Neurontin  Nondisplaced fractures of lateral right fourth/fifth ribs - Seen on recent x-rays on 11/25 (prior hospitalization) Supportive care-encourage incentive spirometry, PCP to recheck two-view chest x-ray in 7 to 10 days.  Incidental finding 10 mm solitary left pulmonary nodule - Will need PCCM follow-up as outpatient.  Request PCP to arrange for outpatient PCP follow-up within 2 weeks of discharge.  BMI: Estimated body mass index is 30.86 kg/m as calculated from the following:   Height as of this encounter: '5\' 3"'$  (1.6 m).   Weight as of this encounter: 79 kg.   Code status:   Code Status: Full Code   DVT Prophylaxis: enoxaparin (LOVENOX) injection 40 mg Start: 04/12/22 1000 SCDs Start: 04/11/22 2318    Family Communication: Lynford Humphrey- [phone] updated 12/1   Disposition Plan: Status is: Inpatient Remains inpatient appropriate because: C. difficile colitis/diverticulitis-not yet stable for discharge.   Planned Discharge Destination:Home health   Diet: Diet Order             Diet Heart Room service appropriate? Yes; Fluid consistency: Thin  Diet effective now                    MEDICATIONS: Scheduled Meds:  arformoterol  15 mcg Nebulization BID   budesonide (PULMICORT) nebulizer solution  0.25 mg Nebulization BID   enoxaparin (LOVENOX) injection  40 mg Subcutaneous Q24H   gabapentin  300 mg Oral BID   influenza  vac split quadrivalent PF  0.5 mL Intramuscular Tomorrow-1000   ipratropium-albuterol  3 mL Nebulization TID   pneumococcal 23 valent vaccine  0.5 mL Intramuscular Tomorrow-1000   prazosin  1 mg Oral QHS   predniSONE  20 mg Oral Q breakfast   And   [START ON 04/16/2022] predniSONE  10 mg Oral Q breakfast   revefenacin  175 mcg  Nebulization Daily   rOPINIRole  1 mg Oral QHS   traZODone  50 mg Oral QHS   vancomycin  125 mg Oral QID   Continuous Infusions:  sodium chloride Stopped (04/13/22 2350)   cefTRIAXone (ROCEPHIN)  IV 2 g (04/13/22 1051)   lactated ringers     metronidazole Stopped (04/13/22 2300)   PRN Meds:.sodium chloride, acetaminophen **OR** acetaminophen, albuterol, ALPRAZolam, benzonatate, fentaNYL (SUBLIMAZE) injection, hydrOXYzine, melatonin, naLOXone (NARCAN)  injection, nicotine, ondansetron (ZOFRAN) IV, oxyCODONE, prazosin   I have personally reviewed following labs and imaging studies  LABORATORY DATA:  Recent Labs  Lab 04/08/22 0622 04/11/22 1947 04/12/22 0445 04/13/22 0051 04/14/22 0043  WBC 10.2 30.7* 21.2* 14.9* 12.9*  HGB 13.9 14.9 14.2 12.1 13.2  HCT 42.3 43.0 41.0 35.0* 38.2  PLT 283 401* 289 282 296  MCV 103.9* 97.1 98.1 96.7 97.7  MCH 34.2* 33.6 34.0 33.4 33.8  MCHC 32.9 34.7 34.6 34.6 34.6  RDW 13.8 13.6 13.8 13.4 13.4  LYMPHSABS  --   --  1.7 1.4  --   MONOABS  --   --  1.4* 0.6  --   EOSABS  --   --  0.0 0.0  --   BASOSABS  --   --  0.1 0.0  --     Recent Labs  Lab 04/08/22 0622 04/11/22 1947 04/11/22 2159 04/12/22 0445 04/13/22 0051 04/14/22 0043  NA 138 135  --  137 136 128*  K 3.8 3.8  --  3.8 3.5 4.1  CL 100 91*  --  94* 97* 90*  CO2 28 27  --  '31 28 26  '$ GLUCOSE 104* 99  --  82 103* 132*  BUN 25* 10  --  '10 10 8  '$ CREATININE 1.00 0.79  --  0.79 0.67 0.66  AST  --  25  --  19  --   --   ALT  --  34  --  26  --   --   ALKPHOS  --  92  --  88  --   --   BILITOT  --  0.9  --  1.1  --   --   ALBUMIN  --  2.9*  --  2.5*  --   --   LATICACIDVEN  --   --  0.7  --   --   --   MG 1.5*  --   --  1.2* 1.9 1.6*  CALCIUM 8.2* 8.5*  --  8.0* 8.2* 8.0*     RADIOLOGY STUDIES/RESULTS: No results found.   LOS: 3 days   Signature  -    Lala Lund M.D on 04/14/2022 at 8:33 AM   -  To page go to [email or web address]

## 2022-04-14 NOTE — Evaluation (Signed)
Occupational Therapy Evaluation Patient Details Name: Vicki [name] MRN: [id number] DOB: [birth year] Today's Date: 04/14/2022   History of Present Illness Pt is a 56 y/o female adm 11/29 with Cdiff. PMhx: admission 11/23-27/23 for COPD exacerbation. COPD on home 5L, Emphysema, asthma, carpal tunnel with release   Clinical Impression   Pt reports independence at baseline with ADLs although takes her increased time due to decreased activity tolerance, lives with spouse who can assist at d/c. Pt currently needing min-mod A for ADLs, supervision for bed mobility, and min guard for transfers with RW. VSS on 5-6L O2 during session. Pt presenting with impairments listed below, will follow acutely. Recommend HHOT at d/c.     Recommendations for follow up therapy are one component of a multi-disciplinary discharge planning process, led by the attending physician.  Recommendations may be updated based on patient status, additional functional criteria and insurance authorization.   Follow Up Recommendations  Home health OT     Assistance Recommended at Discharge Set up Supervision/Assistance  Patient can return home with the following A little help with walking and/or transfers;A little help with bathing/dressing/bathroom;Assistance with cooking/housework;Assist for transportation;Help with stairs or ramp for entrance    Functional Status Assessment  Patient has had a recent decline in their functional status and demonstrates the ability to make significant improvements in function in a reasonable and predictable amount of time.  Equipment Recommendations  BSC/3in1    Recommendations for Other Services PT consult     Precautions / Restrictions Precautions Precautions: Fall Precaution Comments: 5L baseline, incontinent of stool inconsistently, excoriated peri area      Mobility Bed Mobility Overal bed mobility: Needs Assistance Bed Mobility: Supine to Sit     Supine to sit:  Supervision     General bed mobility comments: with HOB elevated    Transfers Overall transfer level: Needs assistance Equipment used: Rolling walker (2 wheels), None Transfers: Sit to/from Stand, Bed to chair/wheelchair/BSC Sit to Stand: Min guard                  Balance Overall balance assessment: Mild deficits observed, not formally tested                                         ADL either performed or assessed with clinical judgement   ADL Overall ADL's : Needs assistance/impaired Eating/Feeding: Modified independent   Grooming: Applying deodorant;Wash/dry face;Oral care;Standing;Min guard   Upper Body Bathing: Min guard   Lower Body Bathing: Moderate assistance   Upper Body Dressing : Min guard   Lower Body Dressing: Moderate assistance   Toilet Transfer: Min guard;Rolling walker (2 wheels);Ambulation;Regular Museum/gallery exhibitions officer and Hygiene: Min guard Toileting - Clothing Manipulation Details (indicate cue type and reason): pericare and clothing mgmt     Functional mobility during ADLs: Min guard;Rolling walker (2 wheels)       Vision   Vision Assessment?: No apparent visual deficits     Agricultural engineer Tested?: No   Praxis Praxis Praxis tested?: Not tested    Pertinent Vitals/Pain Pain Assessment Pain Assessment: No/denies pain     Hand Dominance Left   Extremity/Trunk Assessment Upper Extremity Assessment Upper Extremity Assessment: Generalized weakness   Lower Extremity Assessment Lower Extremity Assessment: Defer to PT evaluation   Cervical / Trunk Assessment Cervical / Trunk Assessment: Kyphotic   Communication Communication Communication: No difficulties  Cognition Arousal/Alertness: Awake/alert Behavior During Therapy: WFL for tasks assessed/performed Overall Cognitive Status: Within Functional Limits for tasks assessed                                        General Comments  VSS on 5L O2 at rest, 6L O2 with mobility as O2 tank does not have 5L Option    Exercises     Shoulder Instructions      Home Living Family/patient expects to be discharged to:: Private residence Living Arrangements: Spouse/significant other;Children Available Help at Discharge: Family;Available 24 hours/day Type of Home: House Home Access: Stairs to enter [employer] of Steps: 2 Entrance Stairs-Rails: None Home Layout: One level     Bathroom Shower/Tub: Teacher, early years/pre: Standard     Home Equipment: Cane - single Barista (2 wheels)          Prior Functioning/Environment Prior Level of Function : Independent/Modified Independent;History of Falls (last six months)               ADLs Comments: modified independent, increased time. Pt reports showers took a long time and left her feeling tired/winded. pt drives short distances. pt getting out for errands on limited basis now.        OT Problem List: Decreased strength;Decreased range of motion;Decreased activity tolerance;Impaired balance (sitting and/or standing);Decreased safety awareness      OT Treatment/Interventions: Self-care/ADL training;Therapeutic exercise;Energy conservation;DME and/or AE instruction;Therapeutic activities;Balance training;Patient/family education    OT Goals(Current goals can be found in the care plan section) Acute Rehab OT Goals Patient Stated Goal: none stated OT Goal Formulation: With patient Time For Goal Achievement: 04/28/22 Potential to Achieve Goals: Good ADL Goals Pt Will Perform Lower Body Dressing: with modified independence;sit to/from stand Pt Will Perform Tub/Shower Transfer: Shower transfer;Tub transfer;with modified independence;ambulating;rolling walker Additional ADL Goal #1: pt will verbalize 3 energy conservation strategies in order to improve ADL independence at home  OT Frequency: Min 2X/week     Co-evaluation              AM-PAC OT "6 Clicks" Daily Activity     Outcome Measure Help from another person eating meals?: None Help from another person taking care of personal grooming?: None Help from another person toileting, which includes using toliet, bedpan, or urinal?: A Little Help from another person bathing (including washing, rinsing, drying)?: A Lot Help from another person to put on and taking off regular upper body clothing?: A Little Help from another person to put on and taking off regular lower body clothing?: A Lot 6 Click Score: 18   End of Session Equipment Utilized During Treatment: Gait belt;Rolling walker (2 wheels);Oxygen Nurse Communication: Mobility status  Activity Tolerance: Patient tolerated treatment well Patient left: in chair;with call bell/phone within reach;with chair alarm set  OT Visit Diagnosis: Unsteadiness on feet (R26.81);Other abnormalities of gait and mobility (R26.89);Muscle weakness (generalized) (M62.81)                Time: 4462-8638 OT Time Calculation (min): 39 min Charges:  OT General Charges $OT Visit: 1 Visit OT Evaluation $OT Eval Moderate Complexity: 1 Mod OT Treatments $Self Care/Home Management : 23-37 mins  Renaye Rakers, OTD, OTR/L [employer] Preferred Acute Rehab [phone]   Renaye Rakers Koonce 04/14/2022, 9:56 AM

## 2022-04-15 DIAGNOSIS — K5792 Diverticulitis of intestine, part unspecified, without perforation or abscess without bleeding: Secondary | ICD-10-CM | POA: Diagnosis not present

## 2022-04-15 LAB — CBC WITH DIFFERENTIAL/PLATELET
Abs Immature Granulocytes: 0.08 10*3/uL — ABNORMAL HIGH (ref 0.00–0.07)
Basophils Absolute: 0 10*3/uL (ref 0.0–0.1)
Basophils Relative: 0 %
Eosinophils Absolute: 0 10*3/uL (ref 0.0–0.5)
Eosinophils Relative: 0 %
HCT: 39.6 % (ref 36.0–46.0)
Hemoglobin: 13.5 g/dL (ref 12.0–15.0)
Immature Granulocytes: 1 %
Lymphocytes Relative: 13 %
Lymphs Abs: 1.3 10*3/uL (ref 0.7–4.0)
MCH: 33.3 pg (ref 26.0–34.0)
MCHC: 34.1 g/dL (ref 30.0–36.0)
MCV: 97.8 fL (ref 80.0–100.0)
Monocytes Absolute: 0.7 10*3/uL (ref 0.1–1.0)
Monocytes Relative: 7 %
Neutro Abs: 8.1 10*3/uL — ABNORMAL HIGH (ref 1.7–7.7)
Neutrophils Relative %: 79 %
Platelets: 359 10*3/uL (ref 150–400)
RBC: 4.05 MIL/uL (ref 3.87–5.11)
RDW: 13.3 % (ref 11.5–15.5)
WBC: 10.2 10*3/uL (ref 4.0–10.5)
nRBC: 0 % (ref 0.0–0.2)

## 2022-04-15 LAB — BASIC METABOLIC PANEL
Anion gap: 9 (ref 5–15)
BUN: 10 mg/dL (ref 6–20)
CO2: 30 mmol/L (ref 22–32)
Calcium: 8.2 mg/dL — ABNORMAL LOW (ref 8.9–10.3)
Chloride: 98 mmol/L (ref 98–111)
Creatinine, Ser: 1.04 mg/dL — ABNORMAL HIGH (ref 0.44–1.00)
GFR, Estimated: >60 mL/min (ref >60)
Glucose, Bld: 122 mg/dL — ABNORMAL HIGH (ref 70–99)
Potassium: 3.7 mmol/L (ref 3.5–5.1)
Sodium: 137 mmol/L (ref 135–145)

## 2022-04-15 LAB — MAGNESIUM: Magnesium: 1.9 mg/dL (ref 1.7–2.4)

## 2022-04-15 LAB — OSMOLALITY, URINE: Osmolality, Ur: 72 mOsm/kg — ABNORMAL LOW (ref 300–900)

## 2022-04-15 LAB — BRAIN NATRIURETIC PEPTIDE: B Natriuretic Peptide: 149.2 pg/mL — ABNORMAL HIGH (ref 0.0–100.0)

## 2022-04-15 LAB — UREA NITROGEN, URINE: Urea Nitrogen, Ur: 85 mg/dL

## 2022-04-15 MED ORDER — POTASSIUM CHLORIDE CRYS ER 20 MEQ PO TBCR
20.0000 meq | EXTENDED_RELEASE_TABLET | Freq: Once | ORAL | Status: AC
  Administered 2022-04-15: 20 meq via ORAL
  Filled 2022-04-15: qty 1

## 2022-04-15 MED ORDER — OXYCODONE HCL 5 MG PO TABS
5.0000 mg | ORAL_TABLET | Freq: Four times a day (QID) | ORAL | Status: DC | PRN
  Administered 2022-04-15 (×3): 5 mg via ORAL
  Filled 2022-04-15 (×3): qty 1

## 2022-04-15 MED ORDER — MAGNESIUM SULFATE IN D5W 1-5 GM/100ML-% IV SOLN
1.0000 g | Freq: Once | INTRAVENOUS | Status: AC
  Administered 2022-04-15: 1 g via INTRAVENOUS
  Filled 2022-04-15: qty 100

## 2022-04-15 MED ORDER — LACTATED RINGERS IV SOLN: INTRAVENOUS | Status: AC

## 2022-04-15 NOTE — Progress Notes (Signed)
PROGRESS NOTE        PATIENT DETAILS Name: Vicki [name] Age: 56 y.o. Sex: female Date of Birth: 22-Nov-1965 Admit Date: 04/11/2022 Admitting Physician Rhetta Mura, DO PCP:Pcp, No  Brief Summary: Patient is a 56 y.o.  female with history of COPD-on home O2-5 L/min-just discharged from this facility on 11/27-presenting with lower abdominal pain/diarrhea-found to have diverticulitis/C. difficile colitis.  Please see below for further details.  Significant events: 11/23-11/27>> hospitalization for AECOPD due to PNA. 11/29>> admit to [facility]-diverticulitis/C. difficile colitis.  Significant studies: 11/29>> CT abdomen/pelvis: Diverticulitis of sigmoid colon.  Resolving infiltrate right base.    Significant microbiology data: 11/29>> blood culture: No growth 11/30>> blood culture: No growth 11/29>> stool C. difficile antigen/toxin: Positive 11/29>> stool GI pathogen panel: Neg  Procedures: None  Consults: None  Subjective:  Patient in bed, appears comfortable, denies any headache, no fever, no chest pain or pressure, no shortness of breath , mild suprapubic abdominal pain. No new focal weakness.    Objective: Vitals: Blood pressure (!) 124/92, pulse 100, temperature 97.8 F (36.6 C), temperature source Oral, resp. rate 13, height '5\' 3"'$  (1.6 m), weight 80.1 kg, SpO2 92 %.   Exam:  Awake Alert, No new F.N deficits, Normal affect [place].AT,PERRAL Supple Neck, No JVD,   Symmetrical Chest wall movement, Good air movement bilaterally, CTAB RRR,No Gallops, Rubs or new Murmurs,  +ve B.Sounds, Abd Soft, No tenderness,   No Cyanosis, Clubbing or edema     Assessment/Plan: Sepsis - Acute sigmoid diverticulitis along with additional possible C. difficile colitis versus colonization- On PO Vancomycin, improving, advance diet and activity. Sepsis resolved.  Finished Rocephin after a total of 4 doses, continue IV Flagyl.   Advanced COPD - Chronic  hypoxic respiratory failure on 5 L of oxygen at home, stable.  Hypomagnesemia - Hyponatremia - Due to GI loss, IVF and IV Mag.  Improved continue to monitor.  HTN - Stable/soft-without use of antihypertensives, resume amlodipine when able  Depression, Anxiety & Insomnia - Continue trazodone/hydroxyzine/Xanax (half of home dose)/prazosin/Requip/Neurontin  Nondisplaced fractures of lateral right fourth/fifth ribs - Seen on recent x-rays on 11/25 (prior hospitalization) Supportive care-encourage incentive spirometry, PCP to recheck two-view chest x-ray in 7 to 10 days.  Incidental finding 10 mm solitary left pulmonary nodule - Will need PCCM follow-up as outpatient.  Request PCP to arrange for outpatient PCP follow-up within 2 weeks of discharge.  BMI: Estimated body mass index is 31.27 kg/m as calculated from the following:   Height as of this encounter: '5\' 3"'$  (1.6 m).   Weight as of this encounter: 80.1 kg.   Code status:   Code Status: Full Code   DVT Prophylaxis: enoxaparin (LOVENOX) injection 40 mg Start: 04/12/22 1000 SCDs Start: 04/11/22 2318    Family Communication: Lynford Humphrey- [phone] updated 12/1   Disposition Plan: Status is: Inpatient Remains inpatient appropriate because: C. difficile colitis/diverticulitis-not yet stable for discharge.   Planned Discharge Destination:Home health   Diet: Diet Order             Diet regular Room service appropriate? Yes; Fluid consistency: Thin  Diet effective now                    MEDICATIONS: Scheduled Meds:  arformoterol  15 mcg Nebulization BID   budesonide (PULMICORT) nebulizer solution  0.25 mg Nebulization BID  enoxaparin (LOVENOX) injection  40 mg Subcutaneous Q24H   gabapentin  300 mg Oral BID   influenza vac split quadrivalent PF  0.5 mL Intramuscular Tomorrow-1000   ipratropium-albuterol  3 mL Nebulization TID   pneumococcal 23 valent vaccine  0.5 mL Intramuscular Tomorrow-1000   potassium  chloride  20 mEq Oral Once   prazosin  1 mg Oral QHS   [START ON 04/16/2022] predniSONE  10 mg Oral Q breakfast   revefenacin  175 mcg Nebulization Daily   rOPINIRole  1 mg Oral QHS   traZODone  50 mg Oral QHS   vancomycin  125 mg Oral QID   Continuous Infusions:  sodium chloride Stopped (04/13/22 2350)   cefTRIAXone (ROCEPHIN)  IV Stopped (04/14/22 0945)   lactated ringers     magnesium sulfate bolus IVPB     metronidazole Stopped (04/14/22 2344)   PRN Meds:.sodium chloride, acetaminophen **OR** acetaminophen, albuterol, ALPRAZolam, benzonatate, camphor-menthol, hydrOXYzine, melatonin, naLOXone (NARCAN)  injection, nicotine, ondansetron (ZOFRAN) IV, oxyCODONE, prazosin   I have personally reviewed following labs and imaging studies  LABORATORY DATA:  Recent Labs  Lab 04/11/22 1947 04/12/22 0445 04/13/22 0051 04/14/22 0043 04/15/22 0038  WBC 30.7* 21.2* 14.9* 12.9* 10.2  HGB 14.9 14.2 12.1 13.2 13.5  HCT 43.0 41.0 35.0* 38.2 39.6  PLT 401* 289 282 296 359  MCV 97.1 98.1 96.7 97.7 97.8  MCH 33.6 34.0 33.4 33.8 33.3  MCHC 34.7 34.6 34.6 34.6 34.1  RDW 13.6 13.8 13.4 13.4 13.3  LYMPHSABS  --  1.7 1.4  --  1.3  MONOABS  --  1.4* 0.6  --  0.7  EOSABS  --  0.0 0.0  --  0.0  BASOSABS  --  0.1 0.0  --  0.0    Recent Labs  Lab 04/11/22 1947 04/11/22 2159 04/12/22 0445 04/13/22 0051 04/14/22 0043 04/15/22 0038  NA 135  --  137 136 128* 137  K 3.8  --  3.8 3.5 4.1 3.7  CL 91*  --  94* 97* 90* 98  CO2 27  --  '31 28 26 30  '$ GLUCOSE 99  --  82 103* 132* 122*  BUN 10  --  '10 10 8 10  '$ CREATININE 0.79  --  0.79 0.67 0.66 1.04*  AST 25  --  19  --   --   --   ALT 34  --  26  --   --   --   ALKPHOS 92  --  88  --   --   --   BILITOT 0.9  --  1.1  --   --   --   ALBUMIN 2.9*  --  2.5*  --   --   --   LATICACIDVEN  --  0.7  --   --   --   --   BNP  --   --   --   --   --  149.2*  MG  --   --  1.2* 1.9 1.6* 1.9  CALCIUM 8.5*  --  8.0* 8.2* 8.0* 8.2*   RADIOLOGY  STUDIES/RESULTS: No results found.   LOS: 4 days   Signature  -    Lala Lund M.D on 04/15/2022 at 8:56 AM   -  To page go to [email or web address]

## 2022-04-16 ENCOUNTER — Other Ambulatory Visit (HOSPITAL_COMMUNITY): Payer: Self-pay

## 2022-04-16 DIAGNOSIS — K5792 Diverticulitis of intestine, part unspecified, without perforation or abscess without bleeding: Secondary | ICD-10-CM | POA: Diagnosis not present

## 2022-04-16 LAB — BASIC METABOLIC PANEL
Anion gap: 9 (ref 5–15)
BUN: 16 mg/dL (ref 6–20)
CO2: 30 mmol/L (ref 22–32)
Calcium: 8.2 mg/dL — ABNORMAL LOW (ref 8.9–10.3)
Chloride: 97 mmol/L — ABNORMAL LOW (ref 98–111)
Creatinine, Ser: 1.02 mg/dL — ABNORMAL HIGH (ref 0.44–1.00)
GFR, Estimated: >60 mL/min (ref >60)
Glucose, Bld: 111 mg/dL — ABNORMAL HIGH (ref 70–99)
Potassium: 3.8 mmol/L (ref 3.5–5.1)
Sodium: 136 mmol/L (ref 135–145)

## 2022-04-16 LAB — CULTURE, BLOOD (ROUTINE X 2): Culture: NO GROWTH

## 2022-04-16 MED ORDER — ALUM & MAG HYDROXIDE-SIMETH 200-200-20 MG/5ML PO SUSP
30.0000 mL | Freq: Four times a day (QID) | ORAL | Status: DC | PRN
  Administered 2022-04-16 (×2): 30 mL via ORAL
  Filled 2022-04-16 (×2): qty 30

## 2022-04-16 MED ORDER — METRONIDAZOLE 500 MG PO TABS
500.0000 mg | ORAL_TABLET | Freq: Three times a day (TID) | ORAL | 0 refills | Status: AC
  Filled 2022-04-16: qty 21, 7d supply, fill #0

## 2022-04-16 MED ORDER — INFLUENZA VAC SPLIT QUAD 0.5 ML IM SUSY
0.5000 mL | PREFILLED_SYRINGE | Freq: Once | INTRAMUSCULAR | Status: AC
  Administered 2022-04-16: 0.5 mL via INTRAMUSCULAR
  Filled 2022-04-16 (×2): qty 0.5

## 2022-04-16 MED ORDER — PNEUMOCOCCAL VAC POLYVALENT 25 MCG/0.5ML IJ INJ
0.5000 mL | INJECTION | Freq: Once | INTRAMUSCULAR | Status: AC
  Administered 2022-04-16: 0.5 mL via INTRAMUSCULAR

## 2022-04-16 MED ORDER — VANCOMYCIN HCL 125 MG PO CAPS
125.0000 mg | ORAL_CAPSULE | Freq: Four times a day (QID) | ORAL | 0 refills | Status: AC
  Filled 2022-04-16: qty 40, 10d supply, fill #0

## 2022-04-16 NOTE — Discharge Summary (Signed)
Vicki [name] DVV:[id number] DOB: [birth year] DOA: 04/11/2022  PCP: Pcp, No  Admit date: 04/11/2022  Discharge date: 04/16/2022  Admitted From: Home   Disposition:  Home   Recommendations for Outpatient Follow-up:   Follow up with PCP in 1-2 weeks  PCP Please obtain BMP/CBC, 2 view CXR in 1week,  (see Discharge instructions)   PCP Please follow up on the following pending results:    Home Health: PT,OT   Equipment/Devices: as below  Consultations: None  Discharge Condition: Stable    CODE STATUS: Full    Diet Recommendation: Heart Healthy     Chief Complaint  Patient presents with   Abdominal Pain     Brief history of present illness from the day of admission and additional interim summary     56 y.o.  female with history of COPD-on home O2-5 L/min-just discharged from this facility on 11/27-presenting with lower abdominal pain/diarrhea-found to have diverticulitis/C. difficile colitis.  Please see below for further details.   Significant events: 11/23-11/27>> hospitalization for AECOPD due to PNA. 11/29>> admit to [facility]-diverticulitis/C. difficile colitis.   Significant studies: 11/29>> CT abdomen/pelvis: Diverticulitis of sigmoid colon.  Resolving infiltrate right base.     Significant microbiology data: 11/29>> blood culture: No growth 11/30>> blood culture: No growth 11/29>> stool C. difficile antigen/toxin: Positive 11/29>> stool GI pathogen panel: [facility]   Sepsis - Acute sigmoid diverticulitis along with acute C. difficile colitis - On PO Vancomycin and IV Flagyl, improved, advanced diet and activity. Sepsis resolved.  Diarrhea significantly improved with 1 bowel movement in the last 24 hours which is  semiformed, will be discharged on oral vancomycin and Flagyl combination.  She did receive 4 days of systemic IV Rocephin as well for diverticulitis.  PCP to monitor and arrange for outpatient GI follow-up in 1 to 2 weeks postdischarge.   Advanced COPD - Chronic hypoxic respiratory failure on 5 L of oxygen at home, stable.  Counseled again to quit smoking.   Hypomagnesemia - Hyponatremia - Due to GI loss, IVF and IV Mag. Stable now.   HTN - resume Home Rx   Depression, Anxiety & Insomnia - Continue trazodone/hydroxyzine/Xanax (half of home dose)/prazosin/Requip/Neurontin   Nondisplaced fractures of lateral right fourth/fifth ribs - Seen on recent x-rays on 11/25 (prior hospitalization) Supportive care-encourage incentive spirometry, PCP to recheck two-view chest x-ray in 7 to 10 days.   Incidental finding 10 mm solitary left pulmonary nodule - Will need PCCM follow-up as outpatient.  Request PCP to arrange for outpatient PCP follow-up within 2 weeks of discharge.   Discharge diagnosis     Principal Problem:   Acute diverticulitis Active Problems:  COPD, severe ([place])   Tobacco abuse   GAD (generalized anxiety disorder)   Chronic respiratory failure with hypoxia ([facility])   Essential hypertension   Dehydration   Sepsis ([place])   Leukocytosis    Discharge instructions    Discharge Instructions     AMB  Referral to Pulmonary Nodule Clinic   Complete by: As directed    Diet - low sodium heart healthy   Complete by: As directed    Discharge instructions   Complete by: As directed    Follow with Primary MD  in 7 days   Get CBC, CMP, 2 view Chest X ray -  checked next visit with your primary MD   Activity: As tolerated with Full fall precautions use walker/cane & assistance as needed  Disposition Home    Diet: Heart Healthy    Special Instructions: If you have smoked or chewed Tobacco  in the last 2 yrs please stop smoking, stop any regular Alcohol  and or any Recreational  drug use.  On your next visit with your primary care physician please Get Medicines reviewed and adjusted.  Please request your Prim.MD to go over all Hospital Tests and Procedure/Radiological results at the follow up, please get all Hospital records sent to your Prim MD by signing hospital release before you go home.  If you experience worsening of your admission symptoms, develop shortness of breath, life threatening emergency, suicidal or homicidal thoughts you must seek medical attention immediately by calling 911 or calling your MD immediately  if symptoms less severe.  You Must read complete instructions/literature along with all the possible adverse reactions/side effects for all the Medicines you take and that have been prescribed to you. Take any new Medicines after you have completely understood and accpet all the possible adverse reactions/side effects.   Increase activity slowly   Complete by: As directed        Discharge Medications   Allergies as of 04/16/2022       Reactions   Jasmine Oil Shortness Of Breath, Swelling   Levaquin [levofloxacin] Shortness Of Breath, Itching, Swelling, Rash   Penicillins Anaphylaxis, Hives   Tolerated ceftriaxone   Aleve [naproxen Sodium] Itching   Ceftin [cefuroxime Axetil] Other (See Comments)   unsure   Chlorhexidine    Depo-provera [medroxyprogesterone Acetate] Other (See Comments)   Severe acne   Doxycycline Other (See Comments)   Raises blood pressure    Oxycodone Nausea And Vomiting   Tramadol Other (See Comments)   Kept her up all night        Medication List     STOP taking these medications    azithromycin 500 MG tablet Commonly known as: ZITHROMAX   ibuprofen 600 MG tablet Commonly known as: ADVIL   montelukast 10 MG tablet Commonly known as: SINGULAIR   predniSONE 10 MG tablet Commonly known as: DELTASONE       TAKE these medications    acetaminophen 500 MG tablet Commonly known as: TYLENOL Take  1,000 mg by mouth every 6 (six) hours as needed for moderate pain.   albuterol 108 (90 Base) MCG/ACT inhaler Commonly known as: VENTOLIN HFA INHALE 2 PUFFS INTO THE LUNGS TWICE A DAY What changed:  how much to take how to take this when to take this additional instructions   ALPRAZolam 1 MG tablet Commonly known as: XANAX TAKE 1 TABLET BY MOUTH THREE TIMES A DAY AS NEEDED FOR ANXIETY What changed: See the new instructions.   amLODipine 5 MG  tablet Commonly known as: NORVASC Take 1 tablet (5 mg total) by mouth daily.   Aspirin Low Dose 81 MG tablet Generic drug: aspirin EC TAKE 1 TABLET BY MOUTH 2 TIMES DAILY. What changed:  how much to take when to take this   azelastine 0.1 % nasal spray Commonly known as: ASTELIN Place 1 spray into both nostrils 2 (two) times daily. Use in each nostril as directed What changed:  when to take this additional instructions   chlorpheniramine-HYDROcodone 10-8 MG/5ML Commonly known as: TUSSIONEX Take 5 mLs by mouth every 12 (twelve) hours as needed for cough.   EPINEPHrine 0.3 mg/0.3 mL Soaj injection Commonly known as: EpiPen 2-Pak Inject 0.3 mg into the muscle as needed for anaphylaxis.   famotidine 20 MG tablet Commonly known as: PEPCID TAKE 1 TABLET BY MOUTH TWICE A DAY (MORNING AND NIGHT) What changed: See the new instructions.   fluticasone-salmeterol 500-50 MCG/ACT Aepb Commonly known as: ADVAIR Inhale 1 puff into the lungs in the morning and at bedtime.   gabapentin 300 MG capsule Commonly known as: NEURONTIN Take 1 capsule (300 mg total) by mouth 2 (two) times daily. What changed: when to take this   hydrOXYzine 25 MG tablet Commonly known as: ATARAX TAKE 0.5-1 TABLETS (12.5-25 MG TOTAL) BY MOUTH 3 (THREE) TIMES DAILY AS NEEDED FOR ANXIETY.   ipratropium-albuterol 0.5-2.5 (3) MG/3ML Soln Commonly known as: DUONEB Use 1 vial by nebulization in the morning and at bedtime.   Magnesium Citrate 100 MG Tabs Take 1  tablet by mouth at bedtime. What changed: how much to take   meclizine 25 MG tablet Commonly known as: ANTIVERT Take 1 tablet (25 mg total) by mouth 3 (three) times daily as needed for dizziness.   metroNIDAZOLE 500 MG tablet Commonly known as: Flagyl Take 1 tablet (500 mg total) by mouth 3 (three) times daily for 7 days.   omeprazole 20 MG capsule Commonly known as: PRILOSEC TAKE 1 CAPSULE BY MOUTH TWICE A DAY What changed: when to take this   ondansetron 8 MG tablet Commonly known as: ZOFRAN Take 1 tablet (8 mg total) by mouth every 8 (eight) hours as needed for nausea or vomiting.   potassium chloride SA 20 MEQ tablet Commonly known as: KLOR-CON M Take 1 tablet (20 mEq total) by mouth daily.   prazosin 1 MG capsule Commonly known as: MINIPRESS TAKE 1 TO 2 CAPSULES (1 TO 2 MG TOTAL) BY MOUTH AT BEDTIME. What changed:  how much to take how to take this when to take this additional instructions   promethazine 25 MG tablet Commonly known as: PHENERGAN Take 1 tablet (25 mg total) by mouth every 8 (eight) hours as needed. What changed: reasons to take this   rizatriptan 10 MG tablet Commonly known as: MAXALT TAKE 1 TABLET BY MOUTH AS NEEDED FOR MIGRAINE. MAY REPEAT IN 2 HOURS IF NEEDED What changed: See the new instructions.   rOPINIRole 2 MG tablet Commonly known as: REQUIP Take 0.5-1 tablets (1-2 mg total) by mouth at bedtime.   Stiolto Respimat 2.5-2.5 MCG/ACT Aers Generic drug: Tiotropium Bromide-Olodaterol Inhale 1 puff into the lungs daily.   traZODone 50 MG tablet Commonly known as: DESYREL Take 1 tablet (50 mg total) by mouth at bedtime.   vancomycin 125 MG capsule Commonly known as: VANCOCIN Take 1 capsule (125 mg total) by mouth 4 (four) times daily for 10 days.               Durable Medical Equipment  (  From admission, onward)           Start     Ordered   04/14/22 0839  For home use only DME Walker rolling  Once       Comments: 5  wheel  Question Answer Comment  Walker: With [place] Wheels   Patient needs a walker to treat with the following condition Weakness      04/14/22 0838             Follow-up Information     [facility] on 04/18/2022.   Why: '@3'$ :30pm Contact information: [place]  [id number] [phone]                Major procedures and Radiology Reports - PLEASE review detailed and final reports thoroughly  -      CT ABDOMEN PELVIS W CONTRAST  Result Date: 04/11/2022 CLINICAL DATA:  Acute abdominal pain and diarrhea, initial encounter EXAM: CT ABDOMEN AND PELVIS WITH CONTRAST TECHNIQUE: Multidetector CT imaging of the abdomen and pelvis was performed using the standard protocol following bolus administration of intravenous contrast. RADIATION DOSE REDUCTION: This exam was performed according to the departmental dose-optimization program which includes automated exposure control, adjustment of the mA and/or kV according to patient size and/or use of iterative reconstruction technique. CONTRAST:  40m OMNIPAQUE IOHEXOL 350 MG/ML SOLN COMPARISON:  04/26/2021 FINDINGS: Lower chest: Lung bases are well aerated. Lingular scarring is noted. Slightly more organized nodular density is noted in the lateral aspect of the left lung base measuring 12 x 7 mm. Mild airspace opacity is noted in the posterior aspect of the right lung base consistent with resolving pneumonia. Hepatobiliary: No focal liver abnormality is seen. Status post cholecystectomy. No biliary dilatation. Pancreas: Unremarkable. No pancreatic ductal dilatation or surrounding inflammatory changes. Spleen: Normal in size without focal abnormality. Adrenals/Urinary Tract: Adrenal glands are within normal limits. Kidneys demonstrate a normal enhancement pattern bilaterally. No renal calculi are seen. No obstructive changes are noted. The bladder is decompressed.  Stomach/Bowel: Diverticular change of the colon is noted with pericolonic inflammatory change and mild free fluid in the pelvis consistent with diverticulitis. No focal abscess is seen. More proximal colon is unremarkable. The appendix is within normal limits. Small bowel and within normal limits. Vascular/Lymphatic: Aortic atherosclerosis. No enlarged abdominal or pelvic lymph nodes. Reproductive: Status post hysterectomy. No adnexal masses. Other: Minimal free fluid is noted in the pelvis reactive to the inflammatory changes from diverticulitis. Again no abscess is noted. Musculoskeletal: Degenerative changes of lumbar spine are noted. IMPRESSION: Diverticulitis of the sigmoid colon without evidence of abscess formation. No definitive perforation is seen. Resolving infiltrate in the right base. 10 mm left solid pulmonary nodule detected on incomplete chest CT. Per Fleischner Society Guidelines, recommend prompt non-contrast Chest CT for further evaluation. These guidelines do not apply to immunocompromised patients and patients with cancer. Follow up in patients with significant comorbidities as clinically warranted. For lung cancer screening, adhere to Lung-RADS guidelines. Reference: Radiology. 2017; 284(1):228-43. Electronically Signed   By: MInez CatalinaM.D.   On: 04/11/2022 21:48   DG Ribs Unilateral Right  Result Date: 04/07/2022 CLINICAL DATA:  Right-sided chest pain EXAM: RIGHT RIBS - 2 VIEW COMPARISON:  04/06/2022 FINDINGS: Nondisplaced fractures of the lateral right fourth and fifth ribs. No acute abnormality of the included lungs. IMPRESSION: Nondisplaced fractures of the lateral right fourth and fifth ribs. Electronically Signed   By: ADelanna Ahmadi  M.D.   On: 04/07/2022 15:48   DG CHEST PORT 1 VIEW  Result Date: 04/06/2022 CLINICAL DATA:  Cough EXAM: PORTABLE CHEST 1 VIEW COMPARISON:  04/05/2022 FINDINGS: Equivocal patchy opacities within the RIGHT LOWER lung noted and may represent  pneumonia. Cardiomediastinal silhouette is unremarkable. No other airspace disease, pneumothorax or pleural effusion noted. IMPRESSION: Equivocal patchy RIGHT LOWER lung opacities which may represent pneumonia. Electronically Signed   By: Margarette Canada M.D.   On: 04/06/2022 17:08   DG Chest 1 View  Result Date: 04/05/2022 CLINICAL DATA:  A [age]-year-old female presents for evaluation of cough and shortness of breath and nausea. EXAM: CHEST  1 VIEW COMPARISON:  May 01, 2021. FINDINGS: EKG leads project over the chest. Trachea midline. Cardiomediastinal contours and hilar structures are normal. Patchy basilar opacities greatest on the LEFT in the mid and lower chest but also present on the RIGHT in the mid and lower chest. No lobar level consolidative changes. No pneumothorax. No gross effusion. Skeletal structures are unremarkable to the extent evaluated on limited evaluation. IMPRESSION: Patchy basilar opacities greatest on the LEFT in the mid and lower chest but also present on the RIGHT in the mid and lower chest. Findings are suspicious for multifocal infection. Would also include the possibility of COVID-19. Electronically Signed   By: Zetta Bills M.D.   On: 04/05/2022 14:33    Micro Results    Recent Results (from the past 240 hour(s))  Respiratory (~20 pathogens) panel by PCR     Status: None   Collection Time: 04/06/22  8:30 AM   Specimen: Nasopharyngeal Swab; Respiratory  Result Value Ref Range Status   Adenovirus NOT DETECTED NOT DETECTED Final   Coronavirus 229E NOT DETECTED NOT DETECTED Final    Comment: (NOTE) The Coronavirus on the Respiratory Panel, DOES NOT test for the novel  Coronavirus (2019 nCoV)    Coronavirus HKU1 NOT DETECTED NOT DETECTED Final   Coronavirus NL63 NOT DETECTED NOT DETECTED Final   Coronavirus OC43 NOT DETECTED NOT DETECTED Final   Metapneumovirus NOT DETECTED NOT DETECTED Final   Rhinovirus / Enterovirus NOT DETECTED NOT DETECTED Final   Influenza  A NOT DETECTED NOT DETECTED Final   Influenza B NOT DETECTED NOT DETECTED Final   Parainfluenza Virus 1 NOT DETECTED NOT DETECTED Final   Parainfluenza Virus 2 NOT DETECTED NOT DETECTED Final   Parainfluenza Virus 3 NOT DETECTED NOT DETECTED Final   Parainfluenza Virus 4 NOT DETECTED NOT DETECTED Final   Respiratory Syncytial Virus NOT DETECTED NOT DETECTED Final   Bordetella pertussis NOT DETECTED NOT DETECTED Final   Bordetella Parapertussis NOT DETECTED NOT DETECTED Final   Chlamydophila pneumoniae NOT DETECTED NOT DETECTED Final   Mycoplasma pneumoniae NOT DETECTED NOT DETECTED Final    Comment: Performed at [facility], [place]. [address], [place], [place] [address]  Expectorated Sputum Assessment w Gram Stain, Rflx to Resp Cult     Status: None   Collection Time: 04/06/22  4:50 PM   Specimen: Expectorated Sputum  Result Value Ref Range Status   Specimen Description EXPECTORATED SPUTUM  Final   Special Requests NONE  Final   Sputum evaluation   Final    THIS SPECIMEN IS ACCEPTABLE FOR SPUTUM CULTURE Performed at [facility], [place] [address], [place], [place] [address]    Report Status 04/07/2022 FINAL  Final  Culture, Respiratory w Gram Stain     Status: None   Collection Time: 04/06/22  4:50 PM  Result Value Ref Range Status   Specimen Description EXPECTORATED SPUTUM  Final   Special Requests NONE Reflexed from [id number]  Final   Gram Stain   Final    MODERATE WBC PRESENT, PREDOMINANTLY PMN FEW GRAM POSITIVE COCCI IN PAIRS RARE YEAST    Culture   Final    RARE Normal respiratory flora-no Staph aureus or Pseudomonas seen Performed at [facility], [address] [place], [place] [address]    Report Status 04/09/2022 FINAL  Final  MRSA Next Gen by PCR, Nasal     Status: None   Collection Time: 04/06/22  4:52 PM   Specimen: Nasal Mucosa; Nasal Swab  Result Value Ref Range Status   MRSA by PCR Next Gen NOT DETECTED NOT DETECTED Final    Comment: (NOTE) The  GeneXpert MRSA Assay (FDA approved for NASAL specimens only), is one component of a comprehensive MRSA colonization surveillance program. It is not intended to diagnose MRSA infection nor to guide or monitor treatment for MRSA infections. Test performance is not FDA approved in patients less than 40 years old. Performed at [facility], [place] [address] [place], [place] [address]   C Difficile Quick Screen w PCR reflex     Status: Abnormal   Collection Time: 04/11/22  7:47 PM   Specimen: Stool  Result Value Ref Range Status   C Diff antigen POSITIVE (A) NEGATIVE Final   C Diff toxin POSITIVE (A) NEGATIVE Final   C Diff interpretation Toxin producing C. difficile detected.  Final    Comment: RESULT CALLED TO, READ BACK BY AND VERIFIED WITH: J FERRAINOLA 04/12/22 0014 BY AB Performed at [facility], [place] [address], [place], [place] [address]   Gastrointestinal Panel by PCR , Stool     Status: None   Collection Time: 04/11/22  8:31 PM   Specimen: Stool  Result Value Ref Range Status   Campylobacter species NOT DETECTED NOT DETECTED Final   Plesimonas shigelloides NOT DETECTED NOT DETECTED Final   Salmonella species NOT DETECTED NOT DETECTED Final   Yersinia enterocolitica NOT DETECTED NOT DETECTED Final   Vibrio species NOT DETECTED NOT DETECTED Final   Vibrio cholerae NOT DETECTED NOT DETECTED Final   Enteroaggregative E coli (EAEC) NOT DETECTED NOT DETECTED Final   Enteropathogenic E coli (EPEC) NOT DETECTED NOT DETECTED Final   Enterotoxigenic E coli (ETEC) NOT DETECTED NOT DETECTED Final   Shiga like toxin producing E coli (STEC) NOT DETECTED NOT DETECTED Final   Shigella/Enteroinvasive E coli (EIEC) NOT DETECTED NOT DETECTED Final   Cryptosporidium NOT DETECTED NOT DETECTED Final   Cyclospora cayetanensis NOT DETECTED NOT DETECTED Final   Entamoeba histolytica NOT DETECTED NOT DETECTED Final   Giardia lamblia NOT DETECTED NOT DETECTED Final   Adenovirus F40/41 NOT  DETECTED NOT DETECTED Final   Astrovirus NOT DETECTED NOT DETECTED Final   Norovirus GI/GII NOT DETECTED NOT DETECTED Final   Rotavirus A NOT DETECTED NOT DETECTED Final   Sapovirus (I, II, IV, and V) NOT DETECTED NOT DETECTED Final    Comment: Performed at [facility], [place] [place],  [address]  Blood culture (routine x 2)     Status: None (Preliminary result)   Collection Time: 04/11/22  9:59 PM   Specimen: BLOOD  Result Value Ref Range Status   Specimen Description BLOOD SITE NOT SPECIFIED  Final   Special Requests   Final    BOTTLES DRAWN AEROBIC AND ANAEROBIC Blood Culture  results may not be optimal due to an inadequate volume of blood received in culture bottles   Culture   Final    NO GROWTH 4 DAYS Performed at [facility], [place] [address] [place], [place] [address]    Report Status PENDING  Incomplete  Blood culture (routine x 2)     Status: None (Preliminary result)   Collection Time: 04/12/22  4:45 AM   Specimen: BLOOD  Result Value Ref Range Status   Specimen Description BLOOD SITE NOT SPECIFIED  Final   Special Requests   Final    BOTTLES DRAWN AEROBIC AND ANAEROBIC Blood Culture adequate volume   Culture   Final    NO GROWTH 3 DAYS Performed at [facility], [address] [place], [place] [address]    Report Status PENDING  Incomplete    Today   Subjective    Vicki [name] today has no headache,no chest abdominal pain,no new weakness tingling or numbness, feels much better wants to go home today.    Objective   Blood pressure 130/95, pulse 95, temperature 97.8 F (36.6 C), temperature source Oral, resp. rate 19, height '5\' 3"'$  (1.6 m), weight 83.5 kg, SpO2 96 %.   Intake/Output Summary (Last 24 hours) at 04/16/2022 0801 Last data filed at 04/16/2022 0536 Gross per 24 hour  Intake 1186.87 ml  Output 1300 ml  Net -113.13 ml    Exam  Awake Alert, No new F.N deficits,    .AT,PERRAL Supple Neck,    Symmetrical Chest wall movement, Good air movement bilaterally, CTAB RRR,No Gallops,   +ve B.Sounds, Abd Soft, Non tender,  No Cyanosis, Clubbing or edema    Data Review   Recent Labs  Lab 04/11/22 1947 04/12/22 0445 04/13/22 0051 04/14/22 0043 04/15/22 0038  WBC 30.7* 21.2* 14.9* 12.9* 10.2  HGB 14.9 14.2 12.1 13.2 13.5  HCT 43.0 41.0 35.0* 38.2 39.6  PLT 401* 289 282 296 359  MCV 97.1 98.1 96.7 97.7 97.8  MCH 33.6 34.0 33.4 33.8 33.3  MCHC 34.7 34.6 34.6 34.6 34.1  RDW 13.6 13.8 13.4 13.4 13.3  LYMPHSABS  --  1.7 1.4  --  1.3  MONOABS  --  1.4* 0.6  --  0.7  EOSABS  --  0.0 0.0  --  0.0  BASOSABS  --  0.1 0.0  --  0.0    Recent Labs  Lab 04/11/22 1947 04/11/22 2159 04/12/22 0445 04/13/22 0051 04/14/22 0043 04/15/22 0038 04/16/22 0100  NA 135  --  137 136 128* 137 136  K 3.8  --  3.8 3.5 4.1 3.7 3.8  CL 91*  --  94* 97* 90* 98 97*  CO2 27  --  '31 28 26 30 30  '$ GLUCOSE 99  --  82 103* 132* 122* 111*  BUN 10  --  '10 10 8 10 16  '$ CREATININE 0.79  --  0.79 0.67 0.66 1.04* 1.02*  AST 25  --  19  --   --   --   --   ALT 34  --  26  --   --   --   --   ALKPHOS 92  --  88  --   --   --   --   BILITOT 0.9  --  1.1  --   --   --   --   ALBUMIN 2.9*  --  2.5*  --   --   --   --  LATICACIDVEN  --  0.7  --   --   --   --   --   BNP  --   --   --   --   --  149.2*  --   MG  --   --  1.2* 1.9 1.6* 1.9  --   CALCIUM 8.5*  --  8.0* 8.2* 8.0* 8.2* 8.2*     Total Time in preparing paper work, data evaluation and todays exam - 35 minutes  Signature  -    Lala Lund M.D on 04/16/2022 at 8:01 AM   -  To page go to [email or web address]

## 2022-04-16 NOTE — Progress Notes (Signed)
Occupational Therapy Treatment Patient Details Name: Vicki [name] MRN: [id number] DOB: October 13, 1965 Today's Date: 04/16/2022   History of present illness Pt is a 56 y/o female adm 11/29 with Cdiff. PMhx: admission 11/23-27/23 for COPD exacerbation. COPD on home 5L, Emphysema, asthma, carpal tunnel with release   OT comments  Pt progressing towards goals this session, completing UB and LB dressing with supervision, min guard for toilet transfer. Pt reporting heartburn, RN giving meds during session. Pt educated on energy conservation strategies for home, along with use of 3in1 as shower seat, pt verbalized understanding. Pt presenting with impairments listed below, will follow acutely. Continue to recommend HHOT at d/c.   Recommendations for follow up therapy are one component of a multi-disciplinary discharge planning process, led by the attending physician.  Recommendations may be updated based on patient status, additional functional criteria and insurance authorization.    Follow Up Recommendations  Home health OT     Assistance Recommended at Discharge Set up Supervision/Assistance  Patient can return home with the following  A little help with walking and/or transfers;A little help with bathing/dressing/bathroom;Assistance with cooking/housework;Assist for transportation;Help with stairs or ramp for entrance   Equipment Recommendations  BSC/3in1    Recommendations for Other Services PT consult    Precautions / Restrictions Precautions Precautions: Fall Precaution Comments: 5L baseline, incontinent of stool inconsistently, excoriated peri area       Mobility Bed Mobility Overal bed mobility: Needs Assistance Bed Mobility: Supine to Sit     Supine to sit: Supervision     General bed mobility comments: with HOB elevated    Transfers Overall transfer level: Needs assistance Equipment used: Rolling walker (2 wheels), None Transfers: Sit to/from Stand, Bed to  chair/wheelchair/BSC Sit to Stand: Min guard                 Balance Overall balance assessment: Mild deficits observed, not formally tested                                         ADL either performed or assessed with clinical judgement   ADL Overall ADL's : Needs assistance/impaired     Grooming: Set up;Standing;Wash/dry hands           Upper Body Dressing : Supervision/safety;Sitting Upper Body Dressing Details (indicate cue type and reason): donning shirt Lower Body Dressing: Supervision/safety;Sitting/lateral leans Lower Body Dressing Details (indicate cue type and reason): donning pants, socks, and shoes using figue 4 Toilet Transfer: Min guard;Rolling walker (2 wheels);Ambulation;Regular Toilet   Toileting- Water quality scientist and Hygiene: Supervision/safety       Functional mobility during ADLs: Supervision/safety;Rolling walker (2 wheels)      Extremity/Trunk Assessment Upper Extremity Assessment Upper Extremity Assessment: Generalized weakness   Lower Extremity Assessment Lower Extremity Assessment: Defer to PT evaluation        Vision   Vision Assessment?: No apparent visual deficits   Perception Perception Perception: Not tested   Praxis Praxis Praxis: Not tested    Cognition Arousal/Alertness: Awake/alert Behavior During Therapy: WFL for tasks assessed/performed Overall Cognitive Status: Within Functional Limits for tasks assessed                                          Exercises      Shoulder Instructions  General Comments VSS on 5L O2, spouse present in room during session    Pertinent Vitals/ Pain       Pain Assessment Pain Assessment: Faces Pain Score: 4  Faces Pain Scale: Hurts little more Pain Location: pelvic area Pain Descriptors / Indicators: Sore Pain Intervention(s): Limited activity within patient's tolerance, Monitored during session, Repositioned  Home Living                                           Prior Functioning/Environment              Frequency  Min 2X/week        Progress Toward Goals  OT Goals(current goals can now be found in the care plan section)  Progress towards OT goals: Progressing toward goals  Acute Rehab OT Goals Patient Stated Goal: none stated OT Goal Formulation: With patient Time For Goal Achievement: 04/28/22 Potential to Achieve Goals: Good ADL Goals Pt Will Perform Lower Body Dressing: with modified independence;sit to/from stand Pt Will Perform Tub/Shower Transfer: Shower transfer;Tub transfer;with modified independence;ambulating;rolling walker Additional ADL Goal #1: pt will verbalize 3 energy conservation strategies in order to improve ADL independence at home  Plan Discharge plan remains appropriate;Frequency remains appropriate    Co-evaluation                 AM-PAC OT "6 Clicks" Daily Activity     Outcome Measure   Help from another person eating meals?: None Help from another person taking care of personal grooming?: None Help from another person toileting, which includes using toliet, bedpan, or urinal?: A Little Help from another person bathing (including washing, rinsing, drying)?: A Lot Help from another person to put on and taking off regular upper body clothing?: A Little Help from another person to put on and taking off regular lower body clothing?: A Lot 6 Click Score: 18    End of Session Equipment Utilized During Treatment: Gait belt;Rolling walker (2 wheels);Oxygen  OT Visit Diagnosis: Unsteadiness on feet (R26.81);Other abnormalities of gait and mobility (R26.89);Muscle weakness (generalized) (M62.81)   Activity Tolerance Patient tolerated treatment well   Patient Left in bed;with call bell/phone within reach;with family/visitor present (seated EOB)   Nurse Communication Mobility status        Time: 9449-6759 OT Time Calculation (min): 23  min  Charges: OT General Charges $OT Visit: 1 Visit OT Treatments $Self Care/Home Management : 23-37 mins  Renaye Rakers, OTD, OTR/L [facility] [facility] Acute Rehab [phone] - [place] 04/16/2022, 9:30 AM

## 2022-04-16 NOTE — Discharge Instructions (Signed)
Follow with Primary MD  in 7 days   Get CBC, CMP, 2 view Chest X ray -  checked next visit with your primary MD   Activity: As tolerated with Full fall precautions use walker/cane & assistance as needed  Disposition Home    Diet: Heart Healthy    Special Instructions: If you have smoked or chewed Tobacco  in the last 2 yrs please stop smoking, stop any regular Alcohol  and or any Recreational drug use.  On your next visit with your primary care physician please Get Medicines reviewed and adjusted.  Please request your Prim.MD to go over all Hospital Tests and Procedure/Radiological results at the follow up, please get all Hospital records sent to your Prim MD by signing hospital release before you go home.  If you experience worsening of your admission symptoms, develop shortness of breath, life threatening emergency, suicidal or homicidal thoughts you must seek medical attention immediately by calling 911 or calling your MD immediately  if symptoms less severe.  You Must read complete instructions/literature along with all the possible adverse reactions/side effects for all the Medicines you take and that have been prescribed to you. Take any new Medicines after you have completely understood and accpet all the possible adverse reactions/side effects.

## 2022-04-16 NOTE — Plan of Care (Signed)
Discharge instructions discussed with patient.  Patient instructed on home medications, restrictions, and follow up appointments. Belongings gathered and sent with patient.  Patients medications given to them from [facility] Patient discharged via wheelchair by this Probation officer.

## 2022-04-17 LAB — CULTURE, BLOOD (ROUTINE X 2)
Culture: NO GROWTH
Special Requests: ADEQUATE

## 2022-04-18 ENCOUNTER — Encounter: Payer: Self-pay | Admitting: Family Medicine

## 2022-04-18 ENCOUNTER — Ambulatory Visit: Payer: Commercial Managed Care - HMO | Attending: Family Medicine | Admitting: Family Medicine

## 2022-04-18 VITALS — BP 112/76 | HR 125 | Ht 63.0 in | Wt 168.0 lb

## 2022-04-18 DIAGNOSIS — R911 Solitary pulmonary nodule: Secondary | ICD-10-CM

## 2022-04-18 DIAGNOSIS — J449 Chronic obstructive pulmonary disease, unspecified: Secondary | ICD-10-CM

## 2022-04-18 DIAGNOSIS — K5792 Diverticulitis of intestine, part unspecified, without perforation or abscess without bleeding: Secondary | ICD-10-CM

## 2022-04-18 DIAGNOSIS — J9611 Chronic respiratory failure with hypoxia: Secondary | ICD-10-CM | POA: Diagnosis not present

## 2022-04-18 DIAGNOSIS — I10 Essential (primary) hypertension: Secondary | ICD-10-CM

## 2022-04-18 DIAGNOSIS — S2241XD Multiple fractures of ribs, right side, subsequent encounter for fracture with routine healing: Secondary | ICD-10-CM

## 2022-04-18 NOTE — Patient Instructions (Signed)
Diverticulitis  Diverticulitis is infection or inflammation of small pouches (diverticula) in the colon that form due to a condition called diverticulosis. Diverticula can trap stool (feces) and bacteria, causing infection and inflammation. Diverticulitis may cause severe stomach pain and diarrhea. It may lead to tissue damage in the colon that causes bleeding or blockage. The diverticula may also burst (rupture) and cause infected stool to enter other areas of the abdomen. What are the causes? This condition is caused by stool becoming trapped in the diverticula, which allows bacteria to grow in the diverticula. This leads to inflammation and infection. What increases the risk? You are more likely to develop this condition if you have diverticulosis. The risk increases if you: Are overweight or obese. Do not get enough exercise. Drink alcohol. Use tobacco products. Eat a diet that has a lot of red meat such as beef, pork, or lamb. Eat a diet that does not include enough fiber. High-fiber foods include fruits, vegetables, beans, nuts, and whole grains. Are over 40 years of age. What are the signs or symptoms? Symptoms of this condition may include: Pain and tenderness in the abdomen. The pain is normally located on the left side of the abdomen, but it may occur in other areas. Fever and chills. Nausea. Vomiting. Cramping. Bloating. Changes in bowel routines. Blood in your stool. How is this diagnosed? This condition is diagnosed based on: Your medical history. A physical exam. Tests to make sure there is nothing else causing your condition. These tests may include: Blood tests. Urine tests. CT scan of the abdomen. How is this treated? Most cases of this condition are mild and can be treated at home. Treatment may include: Taking over-the-counter pain medicines. Following a clear liquid diet. Taking antibiotic medicines by mouth. Resting. More severe cases may need to be treated  at a hospital. Treatment may include: Not eating or drinking. Taking prescription pain medicine. Receiving antibiotic medicines through an IV. Receiving fluids and nutrition through an IV. Surgery. When your condition is under control, your health care provider may recommend that you have a colonoscopy. This is an exam to look at the entire large intestine. During the exam, a lubricated, bendable tube is inserted into the anus and then passed into the rectum, colon, and other parts of the large intestine. A colonoscopy can show how severe your diverticula are and whether something else may be causing your symptoms. Follow these instructions at home: Medicines Take over-the-counter and prescription medicines only as told by your health care provider. These include fiber supplements, probiotics, and stool softeners. If you were prescribed an antibiotic medicine, take it as told by your health care provider. Do not stop taking the antibiotic even if you start to feel better. Ask your health care provider if the medicine prescribed to you requires you to avoid driving or using machinery. Eating and drinking  Follow a full liquid diet or another diet as directed by your health care provider. After your symptoms improve, your health care provider may tell you to change your diet. He or she may recommend that you eat a diet that contains at least 25 grams (25 g) of fiber daily. Fiber makes it easier to pass stool. Healthy sources of fiber include: Berries. One cup contains 4-8 grams of fiber. Beans or lentils. One-half cup contains 5-8 grams of fiber. Green vegetables. One cup contains 4 grams of fiber. Avoid eating red meat. General instructions Do not use any products that contain nicotine or tobacco, such as   cigarettes, e-cigarettes, and chewing tobacco. If you need help quitting, ask your health care provider. Exercise for at least 30 minutes, 3 times each week. You should exercise hard enough to  raise your heart rate and break a sweat. Keep all follow-up visits as told by your health care provider. This is important. You may need to have a colonoscopy. Contact a health care provider if: Your pain does not improve. Your bowel movements do not return to normal. Get help right away if: Your pain gets worse. Your symptoms do not get better with treatment. Your symptoms suddenly get worse. You have a fever. You vomit more than one time. You have stools that are bloody, black, or tarry. Summary Diverticulitis is infection or inflammation of small pouches (diverticula) in the colon that form due to a condition called diverticulosis. Diverticula can trap stool (feces) and bacteria, causing infection and inflammation. You are at higher risk for this condition if you have diverticulosis and you eat a diet that does not include enough fiber. Most cases of this condition are mild and can be treated at home. More severe cases may need to be treated at a hospital. When your condition is under control, your health care provider may recommend that you have an exam called a colonoscopy. This exam can show how severe your diverticula are and whether something else may be causing your symptoms. Keep all follow-up visits as told by your health care provider. This is important. This information is not intended to replace advice given to you by your health care provider. Make sure you discuss any questions you have with your health care provider. Document Revised: 02/09/2019 Document Reviewed: 02/09/2019 [employer] Patient Education  2023 [employer]  

## 2022-04-18 NOTE — Progress Notes (Unsigned)
Subjective:  Patient ID: Vicki [name], female    DOB: 03/25/1966  Age: 56 y.o. MRN: [id number]  CC: Hospitalization Follow-up   HPI [name] is a 56 y.o. year old female with a history of chronic respiratory failure (on 5 L of oxygen), COPD, nicotine dependence. She presents to establish care.  She was hospitalized 04/11/2022 through 04/13/2022 for COPD exacerbation, sepsis secondary to acute diverticulitis lightest.  Treated with IV antibiotics and subsequently discharged with metronidazole and Flagyl. Imaging revealed left pulmonary nodule, evidence of resolving pneumonia. Prior to this hospitalization she was admitted for COPD exacerbation and at that time, rib x-ray had revealed nondisplaced fractures of the lateral right fourth and fifth ribs.  Interval History:  She feels very tired and drained just like how how she felt at discharge.  She has pain in her lower abdomen and has been adherent with her antibiotics. She has a GI doctor and plans to make an appointment . Her diarrhea started back again and she has nausea but no vomiting and appetite is reduced. Diarrhea occurs 6 x /day which has worsened from discharge. Abdominal pain  has somewhat improved but diarrhea is pale   She has had recurrent C. difficile and states this is her fourth time Smokes half a ppd x40 years  Past Medical History:  Diagnosis Date   Allergy    seasonal allergies   Anxiety    on meds   Arthritis    hands   Asthma    childhood-current   CAP (community acquired pneumonia) 04/07/2019   Carpal tunnel syndrome    had bilateral sx to tx   Cataract    bilateral--no sx yet   COPD (chronic obstructive pulmonary disease) ([place])    Depression    on meds   Diverticulitis    Emphysema of lung ([place])    uses MDI   Essential hypertension, benign 07/14/2019   on meds   Hyperlipidemia    on meds   Oxygen deficiency    on 5 L cont [place]   Oxygen dependent    on 5 L cont [place]    Past  Surgical History:  Procedure Laterality Date   ABDOMINAL HYSTERECTOMY  2009   APPENDECTOMY     ARTHROSCOPIC REPAIR ACL Left 2011/2014   x 2   BIOPSY  01/21/2020   Procedure: BIOPSY;  Surgeon: Mauri Pole, MD;  Location: [facility];  Service: Endoscopy;;  EGD and COLON   BUNIONECTOMY  2006   both   CARPAL TUNNEL RELEASE Bilateral 10/28/2013   Procedure: BILATERAL CARPAL TUNNEL RELEASE;  Surgeon: Wynonia Sours, MD;  Location: [place];  Service: Orthopedics;  Laterality: Bilateral;   CHOLECYSTECTOMY  1994   CHONDROPLASTY Right 08/31/2020   Procedure: CHONDROPLASTY;  Surgeon: Earlie Server, MD;  Location: [place];  Service: Orthopedics;  Laterality: Right;   COLONOSCOPY WITH PROPOFOL N/A 01/21/2020   Procedure: COLONOSCOPY WITH PROPOFOL;  Surgeon: Mauri Pole, MD;  Location: [facility];  Service: Endoscopy;  Laterality: N/A;   DILATION AND CURETTAGE OF UTERUS     ESOPHAGOGASTRODUODENOSCOPY (EGD) WITH PROPOFOL N/A 01/21/2020   Procedure: ESOPHAGOGASTRODUODENOSCOPY (EGD) WITH PROPOFOL;  Surgeon: Mauri Pole, MD;  Location: [facility];  Service: Endoscopy;  Laterality: N/A;   KNEE ARTHROSCOPY WITH ANTERIOR CRUCIATE LIGAMENT (ACL) REPAIR Right 08/31/2020   Procedure: RIGHT KNEE ARTHROSCOPY WITH ANTERIOR CRUCIATE LIGAMENT (ACL) REPAIR MEDIAL AND LATERAL  MENISECTOMY;  Surgeon: Earlie Server, MD;  Location: [facility];  Service: Orthopedics;  Laterality: Right;  FEMORAL NERVE BLOCK   POLYPECTOMY  01/21/2020   Procedure: POLYPECTOMY;  Surgeon: Mauri Pole, MD;  Location: [facility];  Service: Endoscopy;;   TUBAL LIGATION  2004    Family History  Problem Relation Age of Onset   Brain cancer Mother 14   Lung cancer Mother 72   Breast cancer Sister 29   Ovarian cancer Sister 49   Colon cancer Neg Hx    Colon polyps Neg Hx    Esophageal cancer Neg Hx    Rectal cancer Neg Hx    Stomach cancer Neg Hx     Social  History   Socioeconomic History   Marital status: Married    Spouse name: [name]   Number of children: 3   Years of education: 12th grade   Highest education level: Not on file  Occupational History   Occupation: Airline pilot: [employer]  Tobacco Use   Smoking status: Every Day    Packs/day: 1.00    Years: 34.00    Total pack years: 34.00    Types: Cigarettes   Smokeless tobacco: Never  Vaping Use   Vaping Use: Never used  Substance and Sexual Activity   Alcohol use: Yes    Comment: everyday   Drug use: No   Sexual activity: Yes    Birth control/protection: Surgical  Other Topics Concern   Not on file  Social History Narrative   Lives with her husband.   Their 3 children live independently.   Social Determinants of Health   Financial Resource Strain: Not on file  Food Insecurity: No Food Insecurity (04/12/2022)   Hunger Vital Sign    Worried About Running Out of Food in the Last Year: Never true    Ran Out of Food in the Last Year: Never true  Transportation Needs: No Transportation Needs (04/12/2022)   PRAPARE - Hydrologist (Medical): No    Lack of Transportation (Non-Medical): No  Physical Activity: Not on file  Stress: Not on file  Social Connections: Not on file    Allergies  Allergen Reactions   Jasmine Oil Shortness Of Breath and Swelling   Levaquin [Levofloxacin] Shortness Of Breath, Itching, Swelling and Rash   Penicillins Anaphylaxis and Hives    Tolerated ceftriaxone   Aleve [Naproxen Sodium] Itching   Ceftin [Cefuroxime Axetil] Other (See Comments)    unsure   Chlorhexidine    Depo-Provera [Medroxyprogesterone Acetate] Other (See Comments)    Severe acne   Doxycycline Other (See Comments)    Raises blood pressure    Oxycodone Nausea And Vomiting   Tramadol Other (See Comments)    Kept her up all night    Outpatient Medications Prior to Visit  Medication Sig Dispense Refill   acetaminophen  (TYLENOL) 500 MG tablet Take 1,000 mg by mouth every 6 (six) hours as needed for moderate pain.     albuterol (VENTOLIN HFA) 108 (90 Base) MCG/ACT inhaler INHALE 2 PUFFS INTO THE LUNGS TWICE A DAY (Patient taking differently: Inhale 2 puffs into the lungs every 4 (four) hours.) 18 each 11   ALPRAZolam (XANAX) 1 MG tablet TAKE 1 TABLET BY MOUTH THREE TIMES A DAY AS NEEDED FOR ANXIETY (Patient taking differently: Take 1 mg by mouth 3 (three) times daily as needed for anxiety.) 90 tablet 0   amLODipine (NORVASC) 5 MG tablet Take 1 tablet (5 mg total) by mouth  daily. 90 tablet 1   ASPIRIN LOW DOSE 81 MG EC tablet TAKE 1 TABLET BY MOUTH 2 TIMES DAILY. (Patient taking differently: Take 81 mg by mouth in the morning and at bedtime.) 180 tablet 1   azelastine (ASTELIN) 0.1 % nasal spray Place 1 spray into both nostrils 2 (two) times daily. Use in each nostril as directed (Patient taking differently: Place 1 spray into both nostrils daily.) 30 mL 12   chlorpheniramine-HYDROcodone (TUSSIONEX) 10-8 MG/5ML Take 5 mLs by mouth every 12 (twelve) hours as needed for cough. 70 mL 0   EPINEPHrine (EPIPEN 2-PAK) 0.3 mg/0.3 mL IJ SOAJ injection Inject 0.3 mg into the muscle as needed for anaphylaxis. 1 each 3   famotidine (PEPCID) 20 MG tablet TAKE 1 TABLET BY MOUTH TWICE A DAY (MORNING AND NIGHT) (Patient taking differently: Take 20 mg by mouth 2 (two) times daily at 8 am and 10 pm.) 60 tablet 1   fluticasone-salmeterol (ADVAIR) 500-50 MCG/ACT AEPB Inhale 1 puff into the lungs in the morning and at bedtime.     gabapentin (NEURONTIN) 300 MG capsule Take 1 capsule (300 mg total) by mouth 2 (two) times daily. (Patient taking differently: Take 300 mg by mouth 3 (three) times daily.) 180 capsule 3   hydrOXYzine (ATARAX) 25 MG tablet TAKE 0.5-1 TABLETS (12.5-25 MG TOTAL) BY MOUTH 3 (THREE) TIMES DAILY AS NEEDED FOR ANXIETY. 270 tablet 2   ipratropium-albuterol (DUONEB) 0.5-2.5 (3) MG/3ML SOLN Use 1 vial by nebulization in the  morning and at bedtime. 360 mL 0   Magnesium Citrate 100 MG TABS Take 1 tablet by mouth at bedtime. (Patient taking differently: Take 100 mg by mouth at bedtime.) 90 tablet 1   meclizine (ANTIVERT) 25 MG tablet Take 1 tablet (25 mg total) by mouth 3 (three) times daily as needed for dizziness. 180 tablet 1   metroNIDAZOLE (FLAGYL) 500 MG tablet Take 1 tablet (500 mg total) by mouth 3 (three) times daily for 7 days. 21 tablet 0   omeprazole (PRILOSEC) 20 MG capsule TAKE 1 CAPSULE BY MOUTH TWICE A DAY (Patient taking differently: Take 20 mg by mouth daily.) 180 capsule 1   ondansetron (ZOFRAN) 8 MG tablet Take 1 tablet (8 mg total) by mouth every 8 (eight) hours as needed for nausea or vomiting. 60 tablet 5   potassium chloride SA (KLOR-CON M) 20 MEQ tablet Take 1 tablet (20 mEq total) by mouth daily. 90 tablet 3   prazosin (MINIPRESS) 1 MG capsule TAKE 1 TO 2 CAPSULES (1 TO 2 MG TOTAL) BY MOUTH AT BEDTIME. (Patient taking differently: Take 1-2 mg by mouth at bedtime.) 180 capsule 1   promethazine (PHENERGAN) 25 MG tablet Take 1 tablet (25 mg total) by mouth every 8 (eight) hours as needed. (Patient taking differently: Take 25 mg by mouth every 8 (eight) hours as needed for nausea or vomiting.) 180 tablet 1   rizatriptan (MAXALT) 10 MG tablet TAKE 1 TABLET BY MOUTH AS NEEDED FOR MIGRAINE. MAY REPEAT IN 2 HOURS IF NEEDED (Patient taking differently: Take 5 mg by mouth 2 (two) times daily as needed for migraine.) 10 tablet 0   rOPINIRole (REQUIP) 2 MG tablet Take 0.5-1 tablets (1-2 mg total) by mouth at bedtime. 180 tablet 1   STIOLTO RESPIMAT 2.5-2.5 MCG/ACT AERS Inhale 1 puff into the lungs daily. 12 g 12   traZODone (DESYREL) 50 MG tablet Take 1 tablet (50 mg total) by mouth at bedtime. 90 tablet 3   vancomycin (VANCOCIN) 125 MG  capsule Take 1 capsule (125 mg total) by mouth 4 (four) times daily for 10 days. 40 capsule 0   No facility-administered medications prior to visit.     ROS Review of  Systems  Constitutional:  Negative for activity change and appetite change.  HENT:  Negative for sinus pressure and sore throat.   Respiratory:  Negative for chest tightness, shortness of breath and wheezing.   Cardiovascular:  Negative for chest pain and palpitations.  Gastrointestinal:  Negative for abdominal distention, abdominal pain and constipation.  Genitourinary: Negative.   Musculoskeletal: Negative.   Psychiatric/Behavioral:  Negative for behavioral problems and dysphoric mood.    *** Objective:  BP 112/76   Pulse (!) 125   Ht '5\' 3"'$  (1.6 m)   Wt 168 lb (76.2 kg)   SpO2 97%   BMI 29.76 kg/m      04/18/2022    3:47 PM 04/16/2022    7:44 AM 04/16/2022    5:32 AM  BP/Weight  Systolic BP 300 923 300  Diastolic BP 76 762 83  Wt. (Lbs) 168    BMI 29.76 kg/m2        Physical Exam Constitutional:      Appearance: She is well-developed.  HENT:     Nose:     Comments: On 5 L oxygen via nasal cannula Cardiovascular:     Rate and Rhythm: Tachycardia present.     Heart sounds: Normal heart sounds. No murmur heard. Pulmonary:     Effort: Pulmonary effort is normal.     Breath sounds: Normal breath sounds. No wheezing or rales.  Chest:     Chest wall: No tenderness.  Abdominal:     General: Bowel sounds are normal. There is no distension.     Palpations: Abdomen is soft. There is no mass.     Tenderness: There is abdominal tenderness (suprapubic).  Musculoskeletal:        General: Normal range of motion.     Right lower leg: No edema.     Left lower leg: No edema.  Neurological:     Mental Status: She is alert and oriented to person, place, and time.  Psychiatric:        Mood and Affect: Mood normal.    ***    Latest Ref Rng & Units 04/16/2022    1:00 AM 04/15/2022   12:38 AM 04/14/2022   12:43 AM  CMP  Glucose 70 - 99 mg/dL 111  122  132   BUN 6 - 20 mg/dL '16  10  8   '$ Creatinine 0.44 - 1.00 mg/dL 1.02  1.04  0.66   Sodium 135 - 145 mmol/L 136  137  128    Potassium 3.5 - 5.1 mmol/L 3.8  3.7  4.1   Chloride 98 - 111 mmol/L 97  98  90   CO2 22 - 32 mmol/L '30  30  26   '$ Calcium 8.9 - 10.3 mg/dL 8.2  8.2  8.0     Lipid Panel     Component Value Date/Time   CHOL 207 (H) 07/14/2019 1458   TRIG 274 (H) 07/14/2019 1458   HDL 66 07/14/2019 1458   CHOLHDL 3.1 07/14/2019 1458   LDLCALC 96 07/14/2019 1458    CBC    Component Value Date/Time   WBC 10.2 04/15/2022 0038   RBC 4.05 04/15/2022 0038   HGB 13.5 04/15/2022 0038   HGB 15.4 10/07/2020 0925   HCT 39.6 04/15/2022 0038   HCT 46.4  10/07/2020 0925   PLT 359 04/15/2022 0038   PLT 149 (L) 08/17/2019 1238   MCV 97.8 04/15/2022 0038   MCV 95 10/07/2020 0925   MCH 33.3 04/15/2022 0038   MCHC 34.1 04/15/2022 0038   RDW 13.3 04/15/2022 0038   RDW 12.5 10/07/2020 0925   LYMPHSABS 1.3 04/15/2022 0038   LYMPHSABS 5.0 (H) 10/07/2020 0925   MONOABS 0.7 04/15/2022 0038   EOSABS 0.0 04/15/2022 0038   EOSABS 0.1 10/07/2020 0925   BASOSABS 0.0 04/15/2022 0038   BASOSABS 0.0 10/07/2020 0925    Lab Results  Component Value Date   HGBA1C 5.4 07/10/2021    Assessment & Plan:  There are no diagnoses linked to this encounter.  Health Care Maintenance: *** No orders of the defined types were placed in this encounter.   Follow-up: No follow-ups on file.       Charlott Rakes, MD, FAAFP. [facility] and [facility], [place]   04/18/2022, 3:58 PM

## 2022-04-19 ENCOUNTER — Encounter: Payer: Self-pay | Admitting: Family Medicine

## 2022-04-25 ENCOUNTER — Other Ambulatory Visit (HOSPITAL_COMMUNITY): Payer: Self-pay

## 2022-04-30 ENCOUNTER — Inpatient Hospital Stay: Payer: Commercial Managed Care - HMO | Admitting: Primary Care

## 2022-05-18 ENCOUNTER — Inpatient Hospital Stay: Payer: Commercial Managed Care - HMO | Admitting: Primary Care

## 2022-06-01 ENCOUNTER — Other Ambulatory Visit: Payer: Self-pay | Admitting: Family Medicine

## 2022-06-01 DIAGNOSIS — F32A Depression, unspecified: Secondary | ICD-10-CM

## 2022-06-04 MED ORDER — ALPRAZOLAM 1 MG PO TABS: ORAL_TABLET | ORAL | 0 refills | Status: DC

## 2022-06-04 NOTE — Telephone Encounter (Signed)
Patient is requesting a refill of the following medications: Requested Prescriptions   Pending Prescriptions Disp Refills   ALPRAZolam (XANAX) 1 MG tablet 90 tablet 0    Date of patient request: 06/04/22 Last office visit: 10/10/21 Date of last refill: 04/03/22 Last refill amount: 90 Follow up time period per chart: 6 month

## 2022-06-04 NOTE — Telephone Encounter (Signed)
Previous patient of Maximiano Coss, NP, plans to establish with [facility]  - appt scheduled 07/09/22.  Controlled substance database reviewed.  Alprazolam 1 mg #90 last filled 04/03/2022, previously 01/08/2022.  Will refill once, further refills from new PCP.

## 2022-06-14 ENCOUNTER — Encounter: Payer: Self-pay | Admitting: Pulmonary Disease

## 2022-06-14 ENCOUNTER — Ambulatory Visit: Payer: 59 | Admitting: Pulmonary Disease

## 2022-06-14 VITALS — BP 104/64 | HR 98 | Ht 63.0 in | Wt 161.6 lb

## 2022-06-14 DIAGNOSIS — R911 Solitary pulmonary nodule: Secondary | ICD-10-CM

## 2022-06-14 DIAGNOSIS — J449 Chronic obstructive pulmonary disease, unspecified: Secondary | ICD-10-CM

## 2022-06-14 MED ORDER — TRELEGY ELLIPTA 200-62.5-25 MCG/ACT IN AEPB: 1.0000 | INHALATION_SPRAY | Freq: Every day | RESPIRATORY_TRACT | 3 refills | Status: DC

## 2022-06-14 NOTE — Patient Instructions (Addendum)
CT scan of the chest in 3 to 4 weeks  PFT on day of next visit  Stiolto switched over to Trelegy 200  Continue rescue inhaler use  Regular exercises  Continue working on quitting smoking  Will see you back in about 4 to 6 weeks, this should be after you have had your CAT scan done  Call with significant concerns

## 2022-06-14 NOTE — Progress Notes (Signed)
Vicki [name]    [id number]    [birth year]  Primary Care Physician:Pcp, No  Referring Physician: No referring provider defined for this encounter.  Chief complaint:   Patient was recently hospitalized for abdominal discomfort Abnormal CT scan of the abdomen-lower lung nodule COPD  HPI:  Patient with obstructive lung disease Active smoker  On oxygen supplementation at 5 L  She does have a cough, sputum production, clear white sputum production Limited with activities of daily living, does get short of breath with chores  She continues with regular inhaler use on Stiolto  She states she does have history of diverticulitis, history of C. difficile colitis Does suffer from episodes of vomiting Last a while before settling  No pertinent occupational history   Outpatient Encounter Medications as of 06/14/2022  Medication Sig   acetaminophen (TYLENOL) 500 MG tablet Take 1,000 mg by mouth every 6 (six) hours as needed for moderate pain.   albuterol (VENTOLIN HFA) 108 (90 Base) MCG/ACT inhaler INHALE 2 PUFFS INTO THE LUNGS TWICE A DAY (Patient taking differently: Inhale 2 puffs into the lungs every 4 (four) hours.)   ALPRAZolam (XANAX) 1 MG tablet TAKE 1 TABLET BY MOUTH THREE TIMES A DAY AS NEEDED FOR ANXIETY Strength: 1 mg   amLODipine (NORVASC) 5 MG tablet Take 1 tablet (5 mg total) by mouth daily.   ASPIRIN LOW DOSE 81 MG EC tablet TAKE 1 TABLET BY MOUTH 2 TIMES DAILY. (Patient taking differently: Take 81 mg by mouth in the morning and at bedtime.)   azelastine (ASTELIN) 0.1 % nasal spray Place 1 spray into both nostrils 2 (two) times daily. Use in each nostril as directed (Patient taking differently: Place 1 spray into both nostrils daily.)   EPINEPHrine (EPIPEN 2-PAK) 0.3 mg/0.3 mL IJ SOAJ injection Inject 0.3 mg into the muscle as needed for anaphylaxis.   famotidine (PEPCID) 20 MG tablet TAKE 1 TABLET BY MOUTH TWICE A DAY (MORNING AND NIGHT) (Patient taking  differently: Take 20 mg by mouth 2 (two) times daily at 8 am and 10 pm.)   gabapentin (NEURONTIN) 300 MG capsule Take 1 capsule (300 mg total) by mouth 2 (two) times daily. (Patient taking differently: Take 300 mg by mouth 3 (three) times daily.)   hydrOXYzine (ATARAX) 25 MG tablet TAKE 0.5-1 TABLETS (12.5-25 MG TOTAL) BY MOUTH 3 (THREE) TIMES DAILY AS NEEDED FOR ANXIETY.   ipratropium-albuterol (DUONEB) 0.5-2.5 (3) MG/3ML SOLN Use 1 vial by nebulization in the morning and at bedtime.   Magnesium Citrate 100 MG TABS Take 1 tablet by mouth at bedtime. (Patient taking differently: Take 100 mg by mouth at bedtime.)   meclizine (ANTIVERT) 25 MG tablet Take 1 tablet (25 mg total) by mouth 3 (three) times daily as needed for dizziness.   omeprazole (PRILOSEC) 20 MG capsule TAKE 1 CAPSULE BY MOUTH TWICE A DAY (Patient taking differently: Take 20 mg by mouth daily.)   ondansetron (ZOFRAN) 8 MG tablet Take 1 tablet (8 mg total) by mouth every 8 (eight) hours as needed for nausea or vomiting.   potassium chloride SA (KLOR-CON M) 20 MEQ tablet Take 1 tablet (20 mEq total) by mouth daily.   prazosin (MINIPRESS) 1 MG capsule TAKE 1 TO 2 CAPSULES (1 TO 2 MG TOTAL) BY MOUTH AT BEDTIME. (Patient taking differently: Take 1-2 mg by mouth at bedtime.)   promethazine (PHENERGAN) 25 MG tablet Take 1 tablet (25 mg total) by mouth every 8 (eight) hours as  needed. (Patient taking differently: Take 25 mg by mouth every 8 (eight) hours as needed for nausea or vomiting.)   rizatriptan (MAXALT) 10 MG tablet TAKE 1 TABLET BY MOUTH AS NEEDED FOR MIGRAINE. MAY REPEAT IN 2 HOURS IF NEEDED (Patient taking differently: Take 5 mg by mouth 2 (two) times daily as needed for migraine.)   rOPINIRole (REQUIP) 2 MG tablet Take 0.5-1 tablets (1-2 mg total) by mouth at bedtime.   STIOLTO RESPIMAT 2.5-2.5 MCG/ACT AERS Inhale 1 puff into the lungs daily.   traZODone (DESYREL) 50 MG tablet Take 1 tablet (50 mg total) by mouth at bedtime.    fluticasone-salmeterol (ADVAIR) 500-50 MCG/ACT AEPB Inhale 1 puff into the lungs in the morning and at bedtime. (Patient not taking: Reported on 06/14/2022)   [DISCONTINUED] chlorpheniramine-HYDROcodone (TUSSIONEX) 10-8 MG/5ML Take 5 mLs by mouth every 12 (twelve) hours as needed for cough.   No facility-administered encounter medications on file as of 06/14/2022.    Allergies as of 06/14/2022 - Review Complete 06/14/2022  Allergen Reaction Noted   Jasmine oil Shortness Of Breath and Swelling 02/12/2013   Levaquin [levofloxacin] Shortness Of Breath, Itching, Swelling, and Rash 04/09/2019   Penicillins Anaphylaxis and Hives 02/12/2013   Aleve [naproxen sodium] Itching 02/12/2013   Ceftin [cefuroxime axetil] Other (See Comments) 02/12/2013   Chlorhexidine  04/27/2021   Depo-provera [medroxyprogesterone acetate] Other (See Comments) 02/12/2013   Doxycycline Other (See Comments) 01/14/2018   Oxycodone Nausea And Vomiting 02/12/2013   Tramadol Other (See Comments) 02/12/2013    Past Medical History:  Diagnosis Date   Allergy    seasonal allergies   Anxiety    on meds   Arthritis    hands   Asthma    childhood-current   CAP (community acquired pneumonia) 04/07/2019   Carpal tunnel syndrome    had bilateral sx to tx   Cataract    bilateral--no sx yet   COPD (chronic obstructive pulmonary disease) ([place])    Depression    on meds   Diverticulitis    Emphysema of lung ([place])    uses MDI   Essential hypertension, benign 07/14/2019   on meds   Hyperlipidemia    on meds   Oxygen deficiency    on 5 L cont [place]   Oxygen dependent    on 5 L cont [place]    Past Surgical History:  Procedure Laterality Date   ABDOMINAL HYSTERECTOMY  2009   APPENDECTOMY     ARTHROSCOPIC REPAIR ACL Left 2011/2014   x 2   BIOPSY  01/21/2020   Procedure: BIOPSY;  Surgeon: Mauri Pole, MD;  Location: [facility];  Service: Endoscopy;;  EGD and COLON   BUNIONECTOMY  2006   both   CARPAL TUNNEL RELEASE  Bilateral 10/28/2013   Procedure: BILATERAL CARPAL TUNNEL RELEASE;  Surgeon: Wynonia Sours, MD;  Location: [facility];  Service: Orthopedics;  Laterality: Bilateral;   CHOLECYSTECTOMY  1994   CHONDROPLASTY Right 08/31/2020   Procedure: CHONDROPLASTY;  Surgeon: Earlie Server, MD;  Location: [facility];  Service: Orthopedics;  Laterality: Right;   COLONOSCOPY WITH PROPOFOL N/A 01/21/2020   Procedure: COLONOSCOPY WITH PROPOFOL;  Surgeon: Mauri Pole, MD;  Location: [facility];  Service: Endoscopy;  Laterality: N/A;   DILATION AND CURETTAGE OF UTERUS     ESOPHAGOGASTRODUODENOSCOPY (EGD) WITH PROPOFOL N/A 01/21/2020   Procedure: ESOPHAGOGASTRODUODENOSCOPY (EGD) WITH PROPOFOL;  Surgeon: Mauri Pole, MD;  Location: [facility];  Service: Endoscopy;  Laterality: N/A;  KNEE ARTHROSCOPY WITH ANTERIOR CRUCIATE LIGAMENT (ACL) REPAIR Right 08/31/2020   Procedure: RIGHT KNEE ARTHROSCOPY WITH ANTERIOR CRUCIATE LIGAMENT (ACL) REPAIR MEDIAL AND LATERAL  MENISECTOMY;  Surgeon: Earlie Server, MD;  Location: [place];  Service: Orthopedics;  Laterality: Right;  FEMORAL NERVE BLOCK   POLYPECTOMY  01/21/2020   Procedure: POLYPECTOMY;  Surgeon: Mauri Pole, MD;  Location: [facility];  Service: Endoscopy;;   TUBAL LIGATION  2004    Family History  Problem Relation Age of Onset   Brain cancer Mother 79   Lung cancer Mother 59   Breast cancer Sister 19   Ovarian cancer Sister 49   Colon cancer Neg Hx    Colon polyps Neg Hx    Esophageal cancer Neg Hx    Rectal cancer Neg Hx    Stomach cancer Neg Hx     Social History   Socioeconomic History   Marital status: Married    Spouse name: [name]   Number of children: 3   Years of education: 12th grade   Highest education level: Not on file  Occupational History   Occupation: Airline pilot: [employer]  Tobacco Use   Smoking status: Every Day    Packs/day: 1.00    Years:  34.00    Total pack years: 34.00    Types: Cigarettes   Smokeless tobacco: Never   Tobacco comments:    6 cigarettes a day   Vaping Use   Vaping Use: Never used  Substance and Sexual Activity   Alcohol use: Yes    Comment: everyday   Drug use: No   Sexual activity: Yes    Birth control/protection: Surgical  Other Topics Concern   Not on file  Social History Narrative   Lives with her husband.   Their 3 children live independently.   Social Determinants of Health   Financial Resource Strain: Not on file  Food Insecurity: No Food Insecurity (04/12/2022)   Hunger Vital Sign    Worried About Running Out of Food in the Last Year: Never true    Ran Out of Food in the Last Year: Never true  Transportation Needs: No Transportation Needs (04/12/2022)   PRAPARE - Hydrologist (Medical): No    Lack of Transportation (Non-Medical): No  Physical Activity: Not on file  Stress: Not on file  Social Connections: Not on file  Intimate Partner Violence: Not At Risk (04/12/2022)   Humiliation, Afraid, Rape, and Kick questionnaire    Fear of Current or Ex-Partner: No    Emotionally Abused: No    Physically Abused: No    Sexually Abused: No    Review of Systems  Respiratory:  Positive for cough and shortness of breath.     Vitals:   06/14/22 0854  BP: 104/64  Pulse: 98  SpO2: 94%     Physical Exam Constitutional:      Appearance: Normal appearance.  HENT:     Head: Normocephalic.     Mouth/Throat:     Mouth: Mucous membranes are moist.  Eyes:     General: No scleral icterus.    Pupils: Pupils are equal, round, and reactive to light.  Cardiovascular:     Rate and Rhythm: Normal rate and regular rhythm.     Heart sounds: No murmur heard. Pulmonary:     Effort: No respiratory distress.     Breath sounds: No stridor. No wheezing or rhonchi.  Musculoskeletal:  Cervical back: No rigidity or tenderness.  Neurological:     Mental Status: She  is alert.  Psychiatric:        Mood and Affect: Mood normal.    Data Reviewed: Recent hospital records reviewed  Past visit from [facility] reviewed  Previous PFT reviewed showing-moderate obstructive disease  CT scan of the abdomen that was performed during recent hospitalization shows left lower lobe lung nodule-reviewed with the patient  Assessment:  Moderate obstructive lung disease  Active smoker  Lung nodule -Possibly inflammation/infection -Repeating the CT scan will be most appropriate to follow-up on the nodule -Did discuss options of management depending on evolution of the nodule  Chronic hypoxemic respiratory failure  Recent multilobar pneumonia  Plan/Recommendations: Schedule for CT scan of the chest in 3 to 4 weeks  Encouraged to continue working on quitting smoking  Continue rescue inhaler use  Switch from [employer] to Trelegy 200  PFT on day of next visit  The importance of quitting smoking was reiterated  Continue oxygen supplementation, monitor oxygen levels with oximetry  Encouraged to call with significant concerns   Sherrilyn Rist MD [facility] 06/14/2022, 9:11 AM  CC: No ref. provider found

## 2022-07-09 ENCOUNTER — Ambulatory Visit: Payer: Commercial Managed Care - HMO | Admitting: Family Medicine

## 2022-07-11 ENCOUNTER — Ambulatory Visit
Admission: RE | Admit: 2022-07-11 | Discharge: 2022-07-11 | Disposition: A | Discharge status: Home / Self Care | Payer: 59 | Source: Ambulatory Visit | Attending: Pulmonary Disease | Admitting: Pulmonary Disease

## 2022-07-11 DIAGNOSIS — R911 Solitary pulmonary nodule: Secondary | ICD-10-CM

## 2022-07-11 DIAGNOSIS — J439 Emphysema, unspecified: Secondary | ICD-10-CM | POA: Diagnosis not present

## 2022-07-11 DIAGNOSIS — I700 Atherosclerosis of aorta: Secondary | ICD-10-CM | POA: Diagnosis not present

## 2022-07-13 ENCOUNTER — Telehealth: Payer: Self-pay | Admitting: Pulmonary Disease

## 2022-07-13 DIAGNOSIS — J441 Chronic obstructive pulmonary disease with (acute) exacerbation: Secondary | ICD-10-CM

## 2022-07-13 NOTE — Telephone Encounter (Signed)
PT just got new Ins. Old ins does not cover CPAP supplies. They must move from [place] to [facility]. I have put new insurance in system.    [facility] [phone]  PT has an O2 concentrator order in w/[facility] and they will need portable tanks and needs new hoses for the nebulizer also.   Please call husband to advise and confirm @ [phone] [facility])

## 2022-07-13 NOTE — Telephone Encounter (Signed)
Dr Jenetta Downer ,  Are you ok if I place new order for CPAP supplies to be changed to [facility] due to insurance requirements  Thank you

## 2022-07-15 NOTE — Telephone Encounter (Signed)
I am okay with placing orders for cpap supplies

## 2022-07-16 NOTE — Telephone Encounter (Signed)
Updated order placed to change DME companies due to insurance. Nothing further needed

## 2022-07-19 ENCOUNTER — Telehealth: Payer: Self-pay | Admitting: Pulmonary Disease

## 2022-07-19 NOTE — Telephone Encounter (Signed)
[place] husband states [facility] does not have the order for CPAP supplies. [facility] fax number is [phone] ATTN: Caryl Pina. [place] phone number is [phone].

## 2022-07-20 ENCOUNTER — Telehealth: Payer: Self-pay | Admitting: Pulmonary Disease

## 2022-07-20 NOTE — Telephone Encounter (Signed)
[place], [place] Mode, [place],  The order is in however, we will need F31fnotes showing usage and benefit to process this request. All I see in the cone chart are phone message notes that I can not use. I need a face 2 face OV note.    Thank you,  Brad New    Patient needs OV for cpap order - will call and sch

## 2022-07-20 NOTE — Telephone Encounter (Signed)
[facility] is able to see office notes in their system without needing to have them faxed. Have sent a community message about the phone call received for our team to get this taken care of.

## 2022-07-20 NOTE — Telephone Encounter (Signed)
This pt needs separate orders for the O2 and the neb  O2 order needs to be on the O2 template   Please advise   Message from [facility]: Liz Malady; [place], Sherie Don, Raven Anner [place], [place]; [name] Raven,  I am not seeing a Valid O2 order or sats for the o2, and I don't see an neb order (She received one from [place] 04/2021). We will need O2 order, sats, office visit notes all within the last 30days(office visit notes can be within the last 60days).  Thanks!

## 2022-07-20 NOTE — Telephone Encounter (Signed)
Pt husband calling, [facility] needs the Pt Clinical notes Fax [phone]

## 2022-07-23 NOTE — Telephone Encounter (Signed)
Left message for patient's husband to call back.  

## 2022-07-23 NOTE — Telephone Encounter (Signed)
Pt. Husband calling back to get update on what they can do to get [facility] stuff done but they need face to face notes

## 2022-07-23 NOTE — Telephone Encounter (Signed)
[facility]ly place order for nebulizer  Place order for oxygen supplementation -Oxygen supplementation at 2 L  Can schedule appointment if this is still needed-can see myself or one of the APP's for face-to-face office visit

## 2022-07-25 ENCOUNTER — Encounter: Payer: Self-pay | Admitting: Primary Care

## 2022-07-25 ENCOUNTER — Ambulatory Visit: Payer: Medicare HMO | Admitting: Primary Care

## 2022-07-25 VITALS — BP 104/68 | HR 107 | Ht 63.0 in | Wt 165.0 lb

## 2022-07-25 DIAGNOSIS — J189 Pneumonia, unspecified organism: Secondary | ICD-10-CM | POA: Diagnosis not present

## 2022-07-25 DIAGNOSIS — J9611 Chronic respiratory failure with hypoxia: Secondary | ICD-10-CM | POA: Diagnosis not present

## 2022-07-25 DIAGNOSIS — J441 Chronic obstructive pulmonary disease with (acute) exacerbation: Secondary | ICD-10-CM | POA: Diagnosis not present

## 2022-07-25 DIAGNOSIS — F17200 Nicotine dependence, unspecified, uncomplicated: Secondary | ICD-10-CM

## 2022-07-25 DIAGNOSIS — Z720 Tobacco use: Secondary | ICD-10-CM

## 2022-07-25 DIAGNOSIS — J449 Chronic obstructive pulmonary disease, unspecified: Secondary | ICD-10-CM

## 2022-07-25 MED ORDER — VARENICLINE TARTRATE (STARTER) 0.5 MG X 11 & 1 MG X 42 PO TBPK: ORAL_TABLET | ORAL | 0 refills | Status: DC

## 2022-07-25 NOTE — Patient Instructions (Addendum)
Recommendations: Continue Trelegy 1 puff daily Start Astelin nasal spray  Start Chantix for smoking cessation Continue to wear 3-5 L of oxygen, titrate as needed to maintain O2 greater than 88 to 90% (you did not drop below 88% on walk test today on 3L)  Referral ENT re: chronic sinusitis  Lung cancer screening re: current smoker   Orders: Oxygen renewal - needs 3-5L   Follow-up: 3 months follow-up with Dr. Ander Slade

## 2022-07-25 NOTE — Progress Notes (Unsigned)
@Patient  ID: Vicki [name], female    DOB: 30-Oct-1965, 57 y.o.   MRN: [id number]  Chief Complaint  Patient presents with   Follow-up    Breathing is ok O2 5l     Referring provider: No ref. provider found  HPI: 57 year old female, smoker.  Past medical history significant for hypertension, severe COPD, chronic respiratory failure, multifocal pneumonia, hyperlipidemia, depression/generalized anxiety disorder, insomnia, restless leg syndrome, polypharmacy, tobacco abuse.  Patient of Dr. Ander Slade.  07/25/2022 Patient presents today for a 4 to 6-week follow-up. Has history of moderate to severe obstructive lung disease, she is active smoker.  She is on 5 L of oxygen for chronic hypoxic respiratory failure.  She was hospitalized for multifocal focal pneumonia in November 2023.  She had a lung nodule on CT scan that was most likely inflammatory/infectious in nature.  She had repeat CT without contrast on 07/11/2022 faint residual 1 to 2 mm groundglass nodule right middle lobe and groundglass opacity lateral aspect of lower lobes bilaterally, suggestive of resolving infectious/inflammatory process.  Mild centrilobular emphysema and scarring at lung bases.  Changed from [facility] to [facility] d/t insurance  Needs qualifying walk test today  Patient is maintained on Trelegy 265mcg one puff. Not having to use rescue inhaler through the n giht as much as she was when on Stiolto  No history of sleep apnea. Not on CPAP.   Fax o2 order [phone] D[facility] attn: Caryl Pina   Room air at rest: 90-91% RA  Room air while ambulating: 87-88% RA Recovering O2 on ?LPM: 94-95% 3L      Allergies  Allergen Reactions   Jasmine Oil Shortness Of Breath and Swelling   Levaquin [Levofloxacin] Shortness Of Breath, Itching, Swelling and Rash   Penicillins Anaphylaxis and Hives    Tolerated ceftriaxone   Aleve [Naproxen Sodium] Itching   Ceftin [Cefuroxime Axetil] Other (See Comments)    unsure    Chlorhexidine    Depo-Provera [Medroxyprogesterone Acetate] Other (See Comments)    Severe acne   Doxycycline Other (See Comments)    Raises blood pressure    Oxycodone Nausea And Vomiting   Tramadol Other (See Comments)    Kept her up all night    Immunization History  Administered Date(s) Administered   Influenza Inj Mdck Quad Pf 02/25/2017, 01/09/2018   Influenza,inj,Quad PF,6+ Mos 02/02/2015, 01/28/2019, 04/16/2022   Influenza-Unspecified 02/18/2017, 01/09/2018, 03/23/2020, 04/14/2021   PFIZER(Purple Top)SARS-COV-2 Vaccination 08/06/2019, 08/29/2019, 03/22/2020   Pneumococcal Polysaccharide-23 03/12/2019, 04/16/2022   Tdap 05/14/2010, 06/13/2020   Zoster Recombinat (Shingrix) 03/12/2019    Past Medical History:  Diagnosis Date   Allergy    seasonal allergies   Anxiety    on meds   Arthritis    hands   Asthma    childhood-current   CAP (community acquired pneumonia) 04/07/2019   Carpal tunnel syndrome    had bilateral sx to tx   Cataract    bilateral--no sx yet   COPD (chronic obstructive pulmonary disease) ([place])    Depression    on meds   Diverticulitis    Emphysema of lung ([place])    uses MDI   Essential hypertension, benign 07/14/2019   on meds   Hyperlipidemia    on meds   Oxygen deficiency    on 5 L cont [place]   Oxygen dependent    on 5 L cont     Tobacco History: Social History   Tobacco Use  Smoking Status Every Day   Packs/day: 1.00  Years: 34.00   Total pack years: 34.00   Types: Cigarettes  Smokeless Tobacco Never  Tobacco Comments   6 cigarettes a day    Ready to quit: Not Answered Counseling given: Not Answered Tobacco comments: 6 cigarettes a day    Outpatient Medications Prior to Visit  Medication Sig Dispense Refill   acetaminophen (TYLENOL) 500 MG tablet Take 1,000 mg by mouth every 6 (six) hours as needed for moderate pain.     albuterol (VENTOLIN HFA) 108 (90 Base) MCG/ACT inhaler INHALE 2 PUFFS INTO THE LUNGS TWICE A DAY  (Patient taking differently: Inhale 2 puffs into the lungs every 4 (four) hours.) 18 each 11   ALPRAZolam (XANAX) 1 MG tablet TAKE 1 TABLET BY MOUTH THREE TIMES A DAY AS NEEDED FOR ANXIETY Strength: 1 mg 90 tablet 0   amLODipine (NORVASC) 5 MG tablet Take 1 tablet (5 mg total) by mouth daily. 90 tablet 1   ASPIRIN LOW DOSE 81 MG EC tablet TAKE 1 TABLET BY MOUTH 2 TIMES DAILY. (Patient taking differently: Take 81 mg by mouth in the morning and at bedtime.) 180 tablet 1   azelastine (ASTELIN) 0.1 % nasal spray Place 1 spray into both nostrils 2 (two) times daily. Use in each nostril as directed (Patient taking differently: Place 1 spray into both nostrils daily.) 30 mL 12   EPINEPHrine (EPIPEN 2-PAK) 0.3 mg/0.3 mL IJ SOAJ injection Inject 0.3 mg into the muscle as needed for anaphylaxis. 1 each 3   fluticasone-salmeterol (ADVAIR) 500-50 MCG/ACT AEPB Inhale 1 puff into the lungs in the morning and at bedtime.     gabapentin (NEURONTIN) 300 MG capsule Take 1 capsule (300 mg total) by mouth 2 (two) times daily. (Patient taking differently: Take 300 mg by mouth 3 (three) times daily.) 180 capsule 3   hydrOXYzine (ATARAX) 25 MG tablet TAKE 0.5-1 TABLETS (12.5-25 MG TOTAL) BY MOUTH 3 (THREE) TIMES DAILY AS NEEDED FOR ANXIETY. 270 tablet 2   ipratropium-albuterol (DUONEB) 0.5-2.5 (3) MG/3ML SOLN Use 1 vial by nebulization in the morning and at bedtime. 360 mL 0   Magnesium Citrate 100 MG TABS Take 1 tablet by mouth at bedtime. (Patient taking differently: Take 100 mg by mouth at bedtime.) 90 tablet 1   meclizine (ANTIVERT) 25 MG tablet Take 1 tablet (25 mg total) by mouth 3 (three) times daily as needed for dizziness. 180 tablet 1   omeprazole (PRILOSEC) 20 MG capsule TAKE 1 CAPSULE BY MOUTH TWICE A DAY (Patient taking differently: Take 20 mg by mouth daily.) 180 capsule 1   ondansetron (ZOFRAN) 8 MG tablet Take 1 tablet (8 mg total) by mouth every 8 (eight) hours as needed for nausea or vomiting. 60 tablet 5    potassium chloride SA (KLOR-CON M) 20 MEQ tablet Take 1 tablet (20 mEq total) by mouth daily. 90 tablet 3   prazosin (MINIPRESS) 1 MG capsule TAKE 1 TO 2 CAPSULES (1 TO 2 MG TOTAL) BY MOUTH AT BEDTIME. (Patient taking differently: Take 1-2 mg by mouth at bedtime.) 180 capsule 1   promethazine (PHENERGAN) 25 MG tablet Take 1 tablet (25 mg total) by mouth every 8 (eight) hours as needed. (Patient taking differently: Take 25 mg by mouth every 8 (eight) hours as needed for nausea or vomiting.) 180 tablet 1   rizatriptan (MAXALT) 10 MG tablet TAKE 1 TABLET BY MOUTH AS NEEDED FOR MIGRAINE. MAY REPEAT IN 2 HOURS IF NEEDED (Patient taking differently: Take 5 mg by mouth 2 (two) times daily  as needed for migraine.) 10 tablet 0   rOPINIRole (REQUIP) 2 MG tablet Take 0.5-1 tablets (1-2 mg total) by mouth at bedtime. 180 tablet 1   traZODone (DESYREL) 50 MG tablet Take 1 tablet (50 mg total) by mouth at bedtime. 90 tablet 3   famotidine (PEPCID) 20 MG tablet TAKE 1 TABLET BY MOUTH TWICE A DAY (MORNING AND NIGHT) (Patient not taking: Reported on 07/25/2022) 60 tablet 1   Fluticasone-Umeclidin-Vilant (TRELEGY ELLIPTA) 200-62.5-25 MCG/ACT AEPB Inhale 1 puff into the lungs daily. (Patient not taking: Reported on 07/25/2022) 1 each 3   STIOLTO RESPIMAT 2.5-2.5 MCG/ACT AERS Inhale 1 puff into the lungs daily. (Patient not taking: Reported on 07/25/2022) 12 g 12   No facility-administered medications prior to visit.      Review of Systems  Review of Systems   Physical Exam  BP 104/68 (BP Location: Left Arm, Patient Position: Sitting, Cuff Size: Normal)   Pulse (!) 107   Ht 5\' 3"  (1.6 m)   Wt 165 lb (74.8 kg)   SpO2 97%   BMI 29.23 kg/m  Physical Exam   Lab Results:  CBC    Component Value Date/Time   WBC 10.2 04/15/2022 0038   RBC 4.05 04/15/2022 0038   HGB 13.5 04/15/2022 0038   HGB 15.4 10/07/2020 0925   HCT 39.6 04/15/2022 0038   HCT 46.4 10/07/2020 0925   PLT 359 04/15/2022 0038   PLT 149  (L) 08/17/2019 1238   MCV 97.8 04/15/2022 0038   MCV 95 10/07/2020 0925   MCH 33.3 04/15/2022 0038   MCHC 34.1 04/15/2022 0038   RDW 13.3 04/15/2022 0038   RDW 12.5 10/07/2020 0925   LYMPHSABS 1.3 04/15/2022 0038   LYMPHSABS 5.0 (H) 10/07/2020 0925   MONOABS 0.7 04/15/2022 0038   EOSABS 0.0 04/15/2022 0038   EOSABS 0.1 10/07/2020 0925   BASOSABS 0.0 04/15/2022 0038   BASOSABS 0.0 10/07/2020 0925    BMET    Component Value Date/Time   NA 136 04/16/2022 0100   NA 140 10/07/2020 0925   K 3.8 04/16/2022 0100   CL 97 (L) 04/16/2022 0100   CO2 30 04/16/2022 0100   GLUCOSE 111 (H) 04/16/2022 0100   BUN 16 04/16/2022 0100   BUN 12 10/07/2020 0925   CREATININE 1.02 (H) 04/16/2022 0100   CALCIUM 8.2 (L) 04/16/2022 0100   GFRNONAA >60 04/16/2022 0100   GFRAA 81 08/17/2019 1238    BNP    Component Value Date/Time   BNP 149.2 (H) 04/15/2022 0038    ProBNP No results found for: "PROBNP"  Imaging: CT Chest Wo Contrast  Result Date: 07/13/2022 CLINICAL DATA:  Lung nodule follow-up. EXAM: CT CHEST WITHOUT CONTRAST TECHNIQUE: Multidetector CT imaging of the chest was performed following the standard protocol without IV contrast. RADIATION DOSE REDUCTION: This exam was performed according to the departmental dose-optimization program which includes automated exposure control, adjustment of the mA and/or kV according to patient size and/or use of iterative reconstruction technique. COMPARISON:  04/11/2022. FINDINGS: Cardiovascular: The heart is normal in size and there is no pericardial effusion. There is atherosclerotic calcification of the aorta without evidence of aneurysm. The pulmonary trunk is normal in caliber. Mediastinum/Nodes: No mediastinal or axillary lymphadenopathy. Evaluation of the hila is limited due to lack of IV contrast. The thyroid gland, trachea, and esophagus are within normal limits. Lungs/Pleura: Mild centrilobular emphysematous changes are present in the lungs. Mild  atelectasis or scarring is present at the lung bases. No effusion or  pneumothorax. A few faint residual 1 mm ground-glass peribronchovascular nodular opacities are noted in the posterior aspect of the right middle lobe, axial image 99. Faint residual ground-glass opacities are noted at the lung bases bilaterally. Upper Abdomen: No acute abnormality. Musculoskeletal: Degenerative changes are present in the thoracic spine. No acute or suspicious osseous abnormality. IMPRESSION: 1. Faint residual 1-2 mm ground-glass nodules in the right middle lobe and ground-glass opacities in the lateral aspects of the lower lobes bilaterally, suggesting resolving infectious or inflammatory process. 2. Emphysema. 3. Aortic atherosclerosis. Electronically Signed   By: Brett Fairy M.D.   On: 07/13/2022 20:13     Assessment & Plan:   No problem-specific Assessment & Plan notes found for this encounter.     Martyn Ehrich, NP 07/25/2022

## 2022-07-26 ENCOUNTER — Telehealth: Payer: Self-pay | Admitting: Primary Care

## 2022-07-26 ENCOUNTER — Other Ambulatory Visit: Payer: Self-pay | Admitting: Family Medicine

## 2022-07-26 DIAGNOSIS — Z1231 Encounter for screening mammogram for malignant neoplasm of breast: Secondary | ICD-10-CM

## 2022-07-26 NOTE — Telephone Encounter (Signed)
[employer] [facility] calling to get the notes on the walk test

## 2022-07-26 NOTE — Telephone Encounter (Signed)
Please ask Vicki [name] she was my Marine scientist. Her O2 dropped to 88% on ambulation; Requiring 3L supplemental O2

## 2022-07-26 NOTE — Telephone Encounter (Signed)
[employer] needs notes on walk test. Patient was seen yesterday 07/25/22. Reviewed patients chart did not see any walk notes. Beth please advise

## 2022-07-27 ENCOUNTER — Telehealth: Payer: Self-pay | Admitting: Primary Care

## 2022-07-27 NOTE — Telephone Encounter (Addendum)
Room air at rest: 90-91% RA  Room air while ambulating: 87-88% RA Recovering O2 on ?LPM: 94-95% 3L  Bonnie please document in flow sheet

## 2022-07-27 NOTE — Telephone Encounter (Signed)
Message received from [employer]:   Vicki [name]; Vicki [name], Vicki [name] we need walk test results.  Room air at rest: Room air while ambulating: Recovering O2 on ?LPM:  Thanks Vicki [name]! Happy Friday!

## 2022-07-27 NOTE — Telephone Encounter (Signed)
Templet was updated by Horris Latino on 07/27/22. Nothing further needed at this time.

## 2022-07-27 NOTE — Telephone Encounter (Signed)
Vicki [name]; [name], [place]; [name] Patients husband called again for status.       Previous Messages    ----- Message ----- From: Nash Shearer Sent: 07/27/2022  12:28 PM EDT To: Darlina Guys; Miquel Dunn; Nash Shearer; * Subject: RE: Phone call received                        Patients husband called me for status.

## 2022-07-30 NOTE — Assessment & Plan Note (Addendum)
-   Patient re-qualified for supplemental oxygen today. O2 87-88% RA with ambulation, needing 3L to maintain O2 >90%. Changing DME companies from [employer] to [facility] [employer].

## 2022-07-30 NOTE — Assessment & Plan Note (Signed)
-   Interested in quitting- she has been on Chantix before and did well on this, she is asking to resume. We have sent in RX and reviewed smoking cessation.

## 2022-07-30 NOTE — Assessment & Plan Note (Addendum)
-   Stable/improved; Prefers Trelegy vs Stiolto. Using SABA less - Continue Trelegy 229mcg one puff daily

## 2022-07-30 NOTE — Assessment & Plan Note (Signed)
-   Repeat CT chest on 07/11/22 showed resolving infectious/inflammatory process

## 2022-07-31 ENCOUNTER — Ambulatory Visit: Payer: Medicare HMO

## 2022-08-03 NOTE — Telephone Encounter (Signed)
This has been updated in patient's chart   Nothing else further needed.

## 2022-08-29 ENCOUNTER — Ambulatory Visit: Payer: Medicare HMO | Admitting: Family Medicine

## 2022-09-17 ENCOUNTER — Ambulatory Visit: Payer: Medicare HMO | Admitting: Family Medicine

## 2022-09-26 ENCOUNTER — Ambulatory Visit (INDEPENDENT_AMBULATORY_CARE_PROVIDER_SITE_OTHER): Payer: Medicare HMO | Admitting: Family Medicine

## 2022-09-26 ENCOUNTER — Encounter: Payer: Self-pay | Admitting: Family Medicine

## 2022-09-26 VITALS — BP 92/70 | HR 106 | Temp 98.4°F | Ht 63.0 in | Wt 157.0 lb

## 2022-09-26 DIAGNOSIS — E785 Hyperlipidemia, unspecified: Secondary | ICD-10-CM | POA: Diagnosis not present

## 2022-09-26 DIAGNOSIS — F32A Depression, unspecified: Secondary | ICD-10-CM | POA: Diagnosis not present

## 2022-09-26 DIAGNOSIS — F17210 Nicotine dependence, cigarettes, uncomplicated: Secondary | ICD-10-CM

## 2022-09-26 DIAGNOSIS — Z716 Tobacco abuse counseling: Secondary | ICD-10-CM | POA: Diagnosis not present

## 2022-09-26 DIAGNOSIS — J449 Chronic obstructive pulmonary disease, unspecified: Secondary | ICD-10-CM | POA: Diagnosis not present

## 2022-09-26 DIAGNOSIS — I10 Essential (primary) hypertension: Secondary | ICD-10-CM

## 2022-09-26 DIAGNOSIS — Z1231 Encounter for screening mammogram for malignant neoplasm of breast: Secondary | ICD-10-CM | POA: Diagnosis not present

## 2022-09-26 DIAGNOSIS — I700 Atherosclerosis of aorta: Secondary | ICD-10-CM

## 2022-09-26 DIAGNOSIS — E663 Overweight: Secondary | ICD-10-CM

## 2022-09-26 DIAGNOSIS — Z7689 Persons encountering health services in other specified circumstances: Secondary | ICD-10-CM | POA: Insufficient documentation

## 2022-09-26 MED ORDER — ALPRAZOLAM 1 MG PO TABS: ORAL_TABLET | ORAL | 0 refills | Status: DC

## 2022-09-26 NOTE — Patient Instructions (Signed)
It was great to meet you today and I'm excited to have you join the [facility] practice. I hope you had a positive experience today! If you feel so inclined, please feel free to recommend our practice to friends and family. Allean Montfort, FNP-C  

## 2022-09-26 NOTE — Assessment & Plan Note (Signed)
Chronic. Unclear what medications she is taking. BP 92/70 today and remains lower end at home. Will return to office in 1 week with meds and BP cuff with log for verification and further discussion.

## 2022-09-26 NOTE — Assessment & Plan Note (Signed)
Currently on ASA 81mg  daily, BP would not tolerate ACE/ARB, no statin currently, will address next week. Fasting labs today. Encouraged heart healthy diet and exercise as tolerated, this is severely limited by her COPD. Encouraged smoking cessation.

## 2022-09-26 NOTE — Assessment & Plan Note (Signed)
Treatment optimized with Pulmonology. ADLs severely limited. She is on 5L nasal cannula.

## 2022-09-26 NOTE — Assessment & Plan Note (Signed)
3-5 minute discussion regarding the harms of tobacco use, the benefits of cessation, and methods of cessation. Discussed that there are medication options to help with cessation. Provided printed education on steps to quit smoking. Patient is not interested in a medication to help. Has tried Wellbutrin, Chiantix, and Nicotine patches.

## 2022-09-26 NOTE — Progress Notes (Signed)
New Patient Office Visit  Subjective    Patient ID: Vicki [name], female    DOB: 22-Apr-1966  Age: 57 y.o. MRN: [id number]  CC:  Chief Complaint  Patient presents with   Establish Care    HPI [name] presents to establish care. Oriented to practice routines and expectations. PMH as below. She does see Pulmonology for her COPD and is dependent on 5L home O2. She does see Gastroenterology for vomiting and severe diarrhea annually.  Breast CA screening: Mammogram status: Completed 2020. Repeat every year Cervical CA screening:  not indicated, hysterectomy Colon CA screening: colonoscopy 3 years ago with abnormalities. Sigmoid diverticulosis and 1 polyp. Tobacco: smoker  (1 ppd x 40 yrs) sees pulmonology, not ready to quit Vaccines:  UTD  PNA vaccine 04/17/2023  HYPERTENSION without Chronic Kidney Disease Hypertension status: controlled  Satisfied with current treatment? yes Duration of hypertension: chronic BP monitoring frequency:  a few times a month BP range: green, 100s/70s BP medication side effects:  no Medication compliance: excellent compliance Previous BP meds:amlodipine Aspirin: yes Recurrent headaches: yes, migraines Visual changes: yes Palpitations: no Dyspnea: yes Chest pain: no Lower extremity edema: no Dizzy/lightheaded: no  COPD COPD status: controlled Satisfied with current treatment?: yes Oxygen use: yes Dyspnea frequency: with showers Cough frequency: throughout the day Rescue inhaler frequency:  BID Limitation of activity: yes Productive cough: yes Last Spirometry:  Pneumovax: Up to Date Influenza: Up to Date    Outpatient Encounter Medications as of 09/26/2022  Medication Sig   acetaminophen (TYLENOL) 500 MG tablet Take 1,000 mg by mouth every 6 (six) hours as needed for moderate pain.   albuterol (VENTOLIN HFA) 108 (90 Base) MCG/ACT inhaler INHALE 2 PUFFS INTO THE LUNGS TWICE A DAY (Patient taking differently: Inhale 2  puffs into the lungs every 4 (four) hours.)   ALPRAZolam (XANAX) 1 MG tablet TAKE 1 TABLET BY MOUTH THREE TIMES A DAY AS NEEDED FOR ANXIETY Strength: 1 mg   amLODipine (NORVASC) 5 MG tablet Take 1 tablet (5 mg total) by mouth daily.   ASPIRIN LOW DOSE 81 MG EC tablet TAKE 1 TABLET BY MOUTH 2 TIMES DAILY. (Patient taking differently: Take 81 mg by mouth in the morning and at bedtime.)   azelastine (ASTELIN) 0.1 % nasal spray Place 1 spray into both nostrils 2 (two) times daily. Use in each nostril as directed (Patient taking differently: Place 1 spray into both nostrils daily.)   EPINEPHrine (EPIPEN 2-PAK) 0.3 mg/0.3 mL IJ SOAJ injection Inject 0.3 mg into the muscle as needed for anaphylaxis.   famotidine (PEPCID) 20 MG tablet TAKE 1 TABLET BY MOUTH TWICE A DAY (MORNING AND NIGHT) (Patient not taking: Reported on 07/25/2022)   Fluticasone-Umeclidin-Vilant (TRELEGY ELLIPTA) 200-62.5-25 MCG/ACT AEPB Inhale 1 puff into the lungs daily. (Patient not taking: Reported on 07/25/2022)   gabapentin (NEURONTIN) 300 MG capsule Take 1 capsule (300 mg total) by mouth 2 (two) times daily. (Patient taking differently: Take 300 mg by mouth 3 (three) times daily.)   hydrOXYzine (ATARAX) 25 MG tablet TAKE 0.5-1 TABLETS (12.5-25 MG TOTAL) BY MOUTH 3 (THREE) TIMES DAILY AS NEEDED FOR ANXIETY.   ipratropium-albuterol (DUONEB) 0.5-2.5 (3) MG/3ML SOLN Use 1 vial by nebulization in the morning and at bedtime.   Magnesium Citrate 100 MG TABS Take 1 tablet by mouth at bedtime. (Patient taking differently: Take 100 mg by mouth at bedtime.)   meclizine (ANTIVERT) 25 MG tablet Take 1 tablet (25 mg total) by mouth 3 (three) times  daily as needed for dizziness.   omeprazole (PRILOSEC) 20 MG capsule TAKE 1 CAPSULE BY MOUTH TWICE A DAY (Patient taking differently: Take 20 mg by mouth daily.)   ondansetron (ZOFRAN) 8 MG tablet Take 1 tablet (8 mg total) by mouth every 8 (eight) hours as needed for nausea or vomiting.   potassium  chloride SA (KLOR-CON M) 20 MEQ tablet Take 1 tablet (20 mEq total) by mouth daily.   prazosin (MINIPRESS) 1 MG capsule TAKE 1 TO 2 CAPSULES (1 TO 2 MG TOTAL) BY MOUTH AT BEDTIME. (Patient taking differently: Take 1-2 mg by mouth at bedtime.)   promethazine (PHENERGAN) 25 MG tablet Take 1 tablet (25 mg total) by mouth every 8 (eight) hours as needed. (Patient taking differently: Take 25 mg by mouth every 8 (eight) hours as needed for nausea or vomiting.)   rizatriptan (MAXALT) 10 MG tablet TAKE 1 TABLET BY MOUTH AS NEEDED FOR MIGRAINE. MAY REPEAT IN 2 HOURS IF NEEDED (Patient taking differently: Take 5 mg by mouth 2 (two) times daily as needed for migraine.)   rOPINIRole (REQUIP) 2 MG tablet Take 0.5-1 tablets (1-2 mg total) by mouth at bedtime.   traZODone (DESYREL) 50 MG tablet Take 1 tablet (50 mg total) by mouth at bedtime.   Varenicline Tartrate, Starter, (CHANTIX STARTING MONTH PAK) 0.5 MG X 11 & 1 MG X 42 TBPK Per pack instructions   [DISCONTINUED] ALPRAZolam (XANAX) 1 MG tablet TAKE 1 TABLET BY MOUTH THREE TIMES A DAY AS NEEDED FOR ANXIETY Strength: 1 mg   No facility-administered encounter medications on file as of 09/26/2022.    Past Medical History:  Diagnosis Date   Allergy    seasonal allergies   Anxiety    on meds   Arthritis    hands   Asthma    childhood-current   CAP (community acquired pneumonia) 04/07/2019   Carpal tunnel syndrome    had bilateral sx to tx   Cataract    bilateral--no sx yet   COPD (chronic obstructive pulmonary disease) (HCC)    Depression    on meds   Diverticulitis    Emphysema of lung (HCC)    uses MDI   Essential hypertension, benign 07/14/2019   on meds   Hyperlipidemia    on meds   Oxygen deficiency    on 5 L cont [place]   Oxygen dependent    on 5 L cont [place]    Past Surgical History:  Procedure Laterality Date   ABDOMINAL HYSTERECTOMY  2009   APPENDECTOMY     ARTHROSCOPIC REPAIR ACL Left 2011/2014   x 2   BIOPSY  01/21/2020    Procedure: BIOPSY;  Surgeon: Napoleon Form, MD;  Location: [facility];  Service: Endoscopy;;  EGD and COLON   BUNIONECTOMY  2006   both   CARPAL TUNNEL RELEASE Bilateral 10/28/2013   Procedure: BILATERAL CARPAL TUNNEL RELEASE;  Surgeon: Nicki Reaper, MD;  Location: [facility];  Service: Orthopedics;  Laterality: Bilateral;   CHOLECYSTECTOMY  1994   CHONDROPLASTY Right 08/31/2020   Procedure: CHONDROPLASTY;  Surgeon: Frederico Hamman, MD;  Location: [facility];  Service: Orthopedics;  Laterality: Right;   COLONOSCOPY WITH PROPOFOL N/A 01/21/2020   Procedure: COLONOSCOPY WITH PROPOFOL;  Surgeon: Napoleon Form, MD;  Location: [facility];  Service: Endoscopy;  Laterality: N/A;   DILATION AND CURETTAGE OF UTERUS     ESOPHAGOGASTRODUODENOSCOPY (EGD) WITH PROPOFOL N/A 01/21/2020   Procedure: ESOPHAGOGASTRODUODENOSCOPY (EGD) WITH PROPOFOL;  Surgeon: Napoleon Form, MD;  Location: [facility];  Service: Endoscopy;  Laterality: N/A;   KNEE ARTHROSCOPY WITH ANTERIOR CRUCIATE LIGAMENT (ACL) REPAIR Right 08/31/2020   Procedure: RIGHT KNEE ARTHROSCOPY WITH ANTERIOR CRUCIATE LIGAMENT (ACL) REPAIR MEDIAL AND LATERAL  MENISECTOMY;  Surgeon: Frederico Hamman, MD;  Location: [facility];  Service: Orthopedics;  Laterality: Right;  FEMORAL NERVE BLOCK   POLYPECTOMY  01/21/2020   Procedure: POLYPECTOMY;  Surgeon: Napoleon Form, MD;  Location: [facility];  Service: Endoscopy;;   TUBAL LIGATION  2004    Family History  Problem Relation Age of Onset   Brain cancer Mother 82   Lung cancer Mother 3   Breast cancer Sister 69   Ovarian cancer Sister 75   Colon cancer Neg Hx    Colon polyps Neg Hx    Esophageal cancer Neg Hx    Rectal cancer Neg Hx    Stomach cancer Neg Hx     Social History   Socioeconomic History   Marital status: Married    Spouse name: Deniece Portela   Number of children: 3   Years of education: 12th grade   Highest education  level: Not on file  Occupational History   Occupation: Multimedia programmer: [employer]  Tobacco Use   Smoking status: Every Day    Packs/day: 1.00    Years: 34.00    Additional pack years: 0.00    Total pack years: 34.00    Types: Cigarettes   Smokeless tobacco: Never   Tobacco comments:    6 cigarettes a day   Vaping Use   Vaping Use: Never used  Substance and Sexual Activity   Alcohol use: Yes    Comment: everyday   Drug use: No   Sexual activity: Yes    Birth control/protection: Surgical  Other Topics Concern   Not on file  Social History Narrative   Lives with her husband.   Their 3 children live independently.   Social Determinants of Health   Financial Resource Strain: Not on file  Food Insecurity: No Food Insecurity (04/12/2022)   Hunger Vital Sign    Worried About Running Out of Food in the Last Year: Never true    Ran Out of Food in the Last Year: Never true  Transportation Needs: No Transportation Needs (04/12/2022)   PRAPARE - Administrator, Civil Service (Medical): No    Lack of Transportation (Non-Medical): No  Physical Activity: Not on file  Stress: Not on file  Social Connections: Not on file  Intimate Partner Violence: Not At Risk (04/12/2022)   Humiliation, Afraid, Rape, and Kick questionnaire    Fear of Current or Ex-Partner: No    Emotionally Abused: No    Physically Abused: No    Sexually Abused: No    Review of Systems  All other systems reviewed and are negative.       Objective    BP 92/70   Pulse (!) 106   Temp 98.4 F (36.9 C) (Oral)   Ht 5\' 3"  (1.6 m)   Wt 157 lb (71.2 kg)   SpO2 93% Comment: 4liters of O2  BMI 27.81 kg/m   Physical Exam Vitals and nursing note reviewed.  Constitutional:      Appearance: Normal appearance. She is normal weight.  HENT:     Head: Normocephalic and atraumatic.  Cardiovascular:     Rate and Rhythm: Normal rate and regular rhythm.     Pulses: Normal  pulses.      Heart sounds: Normal heart sounds.  Pulmonary:     Effort: Pulmonary effort is normal.     Breath sounds: Normal breath sounds.  Skin:    General: Skin is warm and dry.  Neurological:     General: No focal deficit present.     Mental Status: She is alert and oriented to person, place, and time. Mental status is at baseline.  Psychiatric:        Mood and Affect: Mood normal.        Behavior: Behavior normal.        Thought Content: Thought content normal.        Judgment: Judgment normal.         Assessment & Plan:   Problem List Items Addressed This Visit     COPD, severe (HCC)    Treatment optimized with Pulmonology. ADLs severely limited. She is on 5L nasal cannula.      Depression   Relevant Medications   ALPRAZolam (XANAX) 1 MG tablet   Hyperlipidemia   Relevant Orders   Lipid panel   Aortic atherosclerosis (HCC)    Currently on ASA 81mg  daily, BP would not tolerate ACE/ARB, no statin currently, will address next week. Fasting labs today. Encouraged heart healthy diet and exercise as tolerated, this is severely limited by her COPD. Encouraged smoking cessation.      Essential hypertension    Chronic. Unclear what medications she is taking. BP 92/70 today and remains lower end at home. Will return to office in 1 week with meds and BP cuff with log for verification and further discussion.      Relevant Orders   CBC with Differential/Platelet   COMPLETE METABOLIC PANEL WITH GFR   TSH   Encounter to establish care with new doctor - Primary    Today your medical history and current concerns were reviewed. Fasting labs drawn. Will refill Xanax as requested. Return to office in 1 week for BP follow up and discussion of labs.      Encounter for smoking cessation counseling    3-5 minute discussion regarding the harms of tobacco use, the benefits of cessation, and methods of cessation. Discussed that there are medication options to help with cessation. Provided printed  education on steps to quit smoking. Patient is not interested in a medication to help. Has tried Wellbutrin, Chiantix, and Nicotine patches.       Other Visit Diagnoses     Overweight (BMI 25.0-29.9)       Relevant Orders   TSH   Encounter for screening mammogram for malignant neoplasm of breast       Relevant Orders   MM DIGITAL SCREENING BILATERAL       Return in about 1 week (around 10/03/2022) for BP recheck.   Park Meo, FNP

## 2022-09-26 NOTE — Assessment & Plan Note (Signed)
Today your medical history and current concerns were reviewed. Fasting labs drawn. Will refill Xanax as requested. Return to office in 1 week for BP follow up and discussion of labs.

## 2022-09-27 LAB — COMPLETE METABOLIC PANEL WITH GFR
AG Ratio: 1.6 (calc) (ref 1.0–2.5)
ALT: 78 U/L — ABNORMAL HIGH (ref 6–29)
AST: 141 U/L — ABNORMAL HIGH (ref 10–35)
Albumin: 4.6 g/dL (ref 3.6–5.1)
Alkaline phosphatase (APISO): 132 U/L (ref 37–153)
BUN/Creatinine Ratio: 26 (calc) — ABNORMAL HIGH (ref 6–22)
BUN: 30 mg/dL — ABNORMAL HIGH (ref 7–25)
CO2: 28 mmol/L (ref 20–32)
Calcium: 9.6 mg/dL (ref 8.6–10.4)
Chloride: 95 mmol/L — ABNORMAL LOW (ref 98–110)
Creat: 1.14 mg/dL — ABNORMAL HIGH (ref 0.50–1.03)
Globulin: 2.8 g/dL (calc) (ref 1.9–3.7)
Glucose, Bld: 109 mg/dL — ABNORMAL HIGH (ref 65–99)
Potassium: 4.5 mmol/L (ref 3.5–5.3)
Sodium: 137 mmol/L (ref 135–146)
Total Bilirubin: 1.1 mg/dL (ref 0.2–1.2)
Total Protein: 7.4 g/dL (ref 6.1–8.1)
eGFR: 57 mL/min/{1.73_m2} — ABNORMAL LOW (ref >=60)

## 2022-09-27 LAB — CBC WITH DIFFERENTIAL/PLATELET
Absolute Monocytes: 1030 cells/uL — ABNORMAL HIGH (ref 200–950)
Basophils Absolute: 39 cells/uL (ref 0–200)
Basophils Relative: 0.5 %
Eosinophils Absolute: 172 cells/uL (ref 15–500)
Eosinophils Relative: 2.2 %
HCT: 41.1 % (ref 35.0–45.0)
Hemoglobin: 14 g/dL (ref 11.7–15.5)
Lymphs Abs: 1654 cells/uL (ref 850–3900)
MCH: 32.5 pg (ref 27.0–33.0)
MCHC: 34.1 g/dL (ref 32.0–36.0)
MCV: 95.4 fL (ref 80.0–100.0)
MPV: 11.7 fL (ref 7.5–12.5)
Monocytes Relative: 13.2 %
Neutro Abs: 4906 cells/uL (ref 1500–7800)
Neutrophils Relative %: 62.9 %
Platelets: 170 10*3/uL (ref 140–400)
RBC: 4.31 10*6/uL (ref 3.80–5.10)
RDW: 12.7 % (ref 11.0–15.0)
Total Lymphocyte: 21.2 %
WBC: 7.8 10*3/uL (ref 3.8–10.8)

## 2022-09-27 LAB — LIPID PANEL
Cholesterol: 300 mg/dL — ABNORMAL HIGH (ref <200)
HDL: 92 mg/dL (ref >=50)
LDL Cholesterol (Calc): 183 mg/dL (calc) — ABNORMAL HIGH
Non-HDL Cholesterol (Calc): 208 mg/dL (calc) — ABNORMAL HIGH (ref <130)
Total CHOL/HDL Ratio: 3.3 (calc) (ref <5.0)
Triglycerides: 118 mg/dL (ref <150)

## 2022-09-27 LAB — TSH: TSH: 3.55 mIU/L

## 2022-10-02 ENCOUNTER — Other Ambulatory Visit: Payer: Self-pay | Admitting: Family Medicine

## 2022-10-02 DIAGNOSIS — R748 Abnormal levels of other serum enzymes: Secondary | ICD-10-CM

## 2022-10-10 ENCOUNTER — Ambulatory Visit: Payer: Medicare HMO | Admitting: Family Medicine

## 2022-10-16 ENCOUNTER — Telehealth: Payer: Self-pay | Admitting: Family Medicine

## 2022-10-16 NOTE — Telephone Encounter (Signed)
Contacted Vicki [name] to schedule their annual wellness visit. Welcome to Medicare visit Due by 07/13/2023.  Thank you,  [facility] Support [facility] Direct dial  [phone]

## 2022-10-17 ENCOUNTER — Encounter: Payer: Self-pay | Admitting: Pulmonary Disease

## 2022-10-17 ENCOUNTER — Ambulatory Visit: Payer: Medicare HMO | Admitting: Pulmonary Disease

## 2022-10-17 ENCOUNTER — Ambulatory Visit: Payer: Medicare HMO | Admitting: Family Medicine

## 2022-10-24 ENCOUNTER — Ambulatory Visit: Payer: 59 | Admitting: Family Medicine

## 2022-10-25 ENCOUNTER — Ambulatory Visit: Payer: Medicare HMO

## 2022-10-29 ENCOUNTER — Ambulatory Visit (INDEPENDENT_AMBULATORY_CARE_PROVIDER_SITE_OTHER): Payer: Medicare HMO | Admitting: Family Medicine

## 2022-10-29 ENCOUNTER — Encounter: Payer: Self-pay | Admitting: Family Medicine

## 2022-10-29 VITALS — BP 100/74 | HR 107 | Temp 97.8°F | Ht 63.0 in | Wt 163.0 lb

## 2022-10-29 DIAGNOSIS — Z79899 Other long term (current) drug therapy: Secondary | ICD-10-CM | POA: Diagnosis not present

## 2022-10-29 DIAGNOSIS — G43909 Migraine, unspecified, not intractable, without status migrainosus: Secondary | ICD-10-CM | POA: Diagnosis not present

## 2022-10-29 DIAGNOSIS — K219 Gastro-esophageal reflux disease without esophagitis: Secondary | ICD-10-CM | POA: Insufficient documentation

## 2022-10-29 DIAGNOSIS — R112 Nausea with vomiting, unspecified: Secondary | ICD-10-CM | POA: Diagnosis not present

## 2022-10-29 DIAGNOSIS — H9203 Otalgia, bilateral: Secondary | ICD-10-CM

## 2022-10-29 MED ORDER — OMEPRAZOLE 20 MG PO CPDR: 20.0000 mg | DELAYED_RELEASE_CAPSULE | Freq: Every day | ORAL | 3 refills | Status: DC

## 2022-10-29 MED ORDER — RIZATRIPTAN BENZOATE 10 MG PO TABS
10.0000 mg | ORAL_TABLET | Freq: Every day | ORAL | 0 refills | Status: DC | PRN
Start: 2022-10-29 — End: 2022-12-06

## 2022-10-29 NOTE — Assessment & Plan Note (Signed)
Physical exam unremarkable. Encouraged to resume Cetrizine and Azelastine spray daily as prescribed and return to office if no improvement in symptoms.

## 2022-10-29 NOTE — Progress Notes (Signed)
Subjective:  HPI: Vicki [name] is a 57 y.o. female presenting on 10/29/2022 for Follow-up (Med check/refills/Ears keep popping/pain off and on started about 3 weeks ago per pt feels like swimmers ears/have lots of drainage)   HPI Patient is in today for blood pressure follow up and medication management. She is currently taking Amlodipine 5mg  daily and Prazosin 1mg  BID. She does check her BP daily at home and her home readings are usually 100/70s. She requests refills on her Omeprazole and Rizatriptan. She also reports popping pain and fullness in her ears as well as sinus drainage. She denies fever, ear drainage, chills, body aches.  Review of Systems  All other systems reviewed and are negative.   Relevant past medical history reviewed and updated as indicated.   Past Medical History:  Diagnosis Date   Allergy    seasonal allergies   Anxiety    on meds   Arthritis    hands   Asthma    childhood-current   CAP (community acquired pneumonia) 04/07/2019   Carpal tunnel syndrome    had bilateral sx to tx   Cataract    bilateral--no sx yet   COPD (chronic obstructive pulmonary disease) (HCC)    Depression    on meds   Diverticulitis    Emphysema of lung (HCC)    uses MDI   Essential hypertension, benign 07/14/2019   on meds   Hyperlipidemia    on meds   Oxygen deficiency    on 5 L cont [place]   Oxygen dependent    on 5 L cont [place]     Past Surgical History:  Procedure Laterality Date   ABDOMINAL HYSTERECTOMY  2009   APPENDECTOMY     ARTHROSCOPIC REPAIR ACL Left 2011/2014   x 2   BIOPSY  01/21/2020   Procedure: BIOPSY;  Surgeon: Napoleon Form, MD;  Location: [facility];  Service: Endoscopy;;  EGD and COLON   BUNIONECTOMY  2006   both   CARPAL TUNNEL RELEASE Bilateral 10/28/2013   Procedure: BILATERAL CARPAL TUNNEL RELEASE;  Surgeon: Nicki Reaper, MD;  Location: [facility];  Service: Orthopedics;  Laterality: Bilateral;   CHOLECYSTECTOMY   1994   CHONDROPLASTY Right 08/31/2020   Procedure: CHONDROPLASTY;  Surgeon: Frederico Hamman, MD;  Location: [facility];  Service: Orthopedics;  Laterality: Right;   COLONOSCOPY WITH PROPOFOL N/A 01/21/2020   Procedure: COLONOSCOPY WITH PROPOFOL;  Surgeon: Napoleon Form, MD;  Location: [facility];  Service: Endoscopy;  Laterality: N/A;   DILATION AND CURETTAGE OF UTERUS     ESOPHAGOGASTRODUODENOSCOPY (EGD) WITH PROPOFOL N/A 01/21/2020   Procedure: ESOPHAGOGASTRODUODENOSCOPY (EGD) WITH PROPOFOL;  Surgeon: Napoleon Form, MD;  Location: [facility];  Service: Endoscopy;  Laterality: N/A;   KNEE ARTHROSCOPY WITH ANTERIOR CRUCIATE LIGAMENT (ACL) REPAIR Right 08/31/2020   Procedure: RIGHT KNEE ARTHROSCOPY WITH ANTERIOR CRUCIATE LIGAMENT (ACL) REPAIR MEDIAL AND LATERAL  MENISECTOMY;  Surgeon: Frederico Hamman, MD;  Location: [facility];  Service: Orthopedics;  Laterality: Right;  FEMORAL NERVE BLOCK   POLYPECTOMY  01/21/2020   Procedure: POLYPECTOMY;  Surgeon: Napoleon Form, MD;  Location: [facility];  Service: Endoscopy;;   TUBAL LIGATION  2004    Allergies and medications reviewed and updated.   Current Outpatient Medications:    acetaminophen (TYLENOL) 500 MG tablet, Take 1,000 mg by mouth every 6 (six) hours as needed for moderate pain., Disp: , Rfl:    albuterol (VENTOLIN HFA) 108 (90 Base)  MCG/ACT inhaler, INHALE 2 PUFFS INTO THE LUNGS TWICE A DAY (Patient taking differently: Inhale 2 puffs into the lungs every 4 (four) hours.), Disp: 18 each, Rfl: 11   ALPRAZolam (XANAX) 1 MG tablet, TAKE 1 TABLET BY MOUTH THREE TIMES A DAY AS NEEDED FOR ANXIETY Strength: 1 mg, Disp: 90 tablet, Rfl: 0   amLODipine (NORVASC) 5 MG tablet, Take 1 tablet (5 mg total) by mouth daily., Disp: 90 tablet, Rfl: 1   ASPIRIN LOW DOSE 81 MG EC tablet, TAKE 1 TABLET BY MOUTH 2 TIMES DAILY. (Patient taking differently: Take 81 mg by mouth in the morning and at bedtime.), Disp: 180  tablet, Rfl: 1   azelastine (ASTELIN) 0.1 % nasal spray, Place 1 spray into both nostrils 2 (two) times daily. Use in each nostril as directed (Patient taking differently: Place 1 spray into both nostrils daily.), Disp: 30 mL, Rfl: 12   EPINEPHrine (EPIPEN 2-PAK) 0.3 mg/0.3 mL IJ SOAJ injection, Inject 0.3 mg into the muscle as needed for anaphylaxis., Disp: 1 each, Rfl: 3   gabapentin (NEURONTIN) 300 MG capsule, Take 1 capsule (300 mg total) by mouth 2 (two) times daily. (Patient taking differently: Take 300 mg by mouth 3 (three) times daily.), Disp: 180 capsule, Rfl: 3   hydrOXYzine (ATARAX) 25 MG tablet, TAKE 0.5-1 TABLETS (12.5-25 MG TOTAL) BY MOUTH 3 (THREE) TIMES DAILY AS NEEDED FOR ANXIETY., Disp: 270 tablet, Rfl: 2   ipratropium-albuterol (DUONEB) 0.5-2.5 (3) MG/3ML SOLN, Use 1 vial by nebulization in the morning and at bedtime., Disp: 360 mL, Rfl: 0   Magnesium Citrate 100 MG TABS, Take 1 tablet by mouth at bedtime. (Patient taking differently: Take 100 mg by mouth at bedtime.), Disp: 90 tablet, Rfl: 1   meclizine (ANTIVERT) 25 MG tablet, Take 1 tablet (25 mg total) by mouth 3 (three) times daily as needed for dizziness., Disp: 180 tablet, Rfl: 1   ondansetron (ZOFRAN) 8 MG tablet, Take 1 tablet (8 mg total) by mouth every 8 (eight) hours as needed for nausea or vomiting., Disp: 60 tablet, Rfl: 5   potassium chloride SA (KLOR-CON M) 20 MEQ tablet, Take 1 tablet (20 mEq total) by mouth daily., Disp: 90 tablet, Rfl: 3   prazosin (MINIPRESS) 1 MG capsule, TAKE 1 TO 2 CAPSULES (1 TO 2 MG TOTAL) BY MOUTH AT BEDTIME. (Patient taking differently: Take 1-2 mg by mouth at bedtime.), Disp: 180 capsule, Rfl: 1   promethazine (PHENERGAN) 25 MG tablet, Take 1 tablet (25 mg total) by mouth every 8 (eight) hours as needed. (Patient taking differently: Take 25 mg by mouth every 8 (eight) hours as needed for nausea or vomiting.), Disp: 180 tablet, Rfl: 1   rOPINIRole (REQUIP) 2 MG tablet, Take 0.5-1 tablets (1-2  mg total) by mouth at bedtime., Disp: 180 tablet, Rfl: 1   traZODone (DESYREL) 50 MG tablet, Take 1 tablet (50 mg total) by mouth at bedtime., Disp: 90 tablet, Rfl: 3   Varenicline Tartrate, Starter, (CHANTIX STARTING MONTH PAK) 0.5 MG X 11 & 1 MG X 42 TBPK, Per pack instructions, Disp: 53 each, Rfl: 0   omeprazole (PRILOSEC) 20 MG capsule, Take 1 capsule (20 mg total) by mouth daily., Disp: 90 capsule, Rfl: 3   rizatriptan (MAXALT) 10 MG tablet, Take 1 tablet (10 mg total) by mouth daily as needed for migraine. TAKE 1 TABLET BY MOUTH AS NEEDED FOR MIGRAINE. MAY REPEAT IN 2 HOURS IF NEEDED Strength: 10 mg, Disp: 10 tablet, Rfl: 0  Allergies  Allergen  Reactions   Jasmine Oil Shortness Of Breath and Swelling   Levaquin [Levofloxacin] Shortness Of Breath, Itching, Swelling and Rash   Penicillins Anaphylaxis and Hives    Tolerated ceftriaxone   Aleve [Naproxen Sodium] Itching   Ceftin [Cefuroxime Axetil] Other (See Comments)    unsure   Chlorhexidine    Depo-Provera [Medroxyprogesterone Acetate] Other (See Comments)    Severe acne   Doxycycline Other (See Comments)    Raises blood pressure    Oxycodone Nausea And Vomiting   Tramadol Other (See Comments)    Kept her up all night    Objective:   BP 100/74   Pulse (!) 107   Temp 97.8 F (36.6 C) (Oral)   Ht 5\' 3"  (1.6 m)   Wt 163 lb (73.9 kg)   SpO2 93% Comment: 4 liters Oxygen  BMI 28.87 kg/m      10/29/2022   10:13 AM 09/26/2022    3:13 PM 07/25/2022   10:37 AM  Vitals with BMI  Height 5\' 3"  5\' 3"  5\' 3"   Weight 163 lbs 157 lbs 165 lbs  BMI 28.88 27.82 29.24  Systolic 100 92 104  Diastolic 74 70 68  Pulse 107 106 107     Physical Exam Vitals and nursing note reviewed.  Constitutional:      Appearance: Normal appearance. She is normal weight.  HENT:     Head: Normocephalic and atraumatic.     Right Ear: Tympanic membrane, ear canal and external ear normal.     Left Ear: Tympanic membrane, ear canal and external ear  normal.  Cardiovascular:     Rate and Rhythm: Normal rate and regular rhythm.     Pulses: Normal pulses.     Heart sounds: Normal heart sounds.  Pulmonary:     Effort: Pulmonary effort is normal.     Breath sounds: Normal breath sounds.  Skin:    General: Skin is warm and dry.  Neurological:     General: No focal deficit present.     Mental Status: She is alert and oriented to person, place, and time. Mental status is at baseline.  Psychiatric:        Mood and Affect: Mood normal.        Behavior: Behavior normal.        Thought Content: Thought content normal.        Judgment: Judgment normal.     Assessment & Plan:  Polypharmacy Assessment & Plan: Reviewed patinets medications and provided refills for Omeprazole and Rizatriptan as requested.    Migraine without status migrainosus, not intractable, unspecified migraine type -     Rizatriptan Benzoate; Take 1 tablet (10 mg total) by mouth daily as needed for migraine. TAKE 1 TABLET BY MOUTH AS NEEDED FOR MIGRAINE. MAY REPEAT IN 2 HOURS IF NEEDED Strength: 10 mg  Dispense: 10 tablet; Refill: 0  Gastroesophageal reflux disease without esophagitis -     Omeprazole; Take 1 capsule (20 mg total) by mouth daily.  Dispense: 90 capsule; Refill: 3  Ear pain, bilateral Assessment & Plan: Physical exam unremarkable. Encouraged to resume Cetrizine and Azelastine spray daily as prescribed and return to office if no improvement in symptoms.      Follow up plan: Return in about 6 months (around 04/30/2023) for chronic follow-up with labs 1 week prior.  Park Meo, FNP

## 2022-10-29 NOTE — Assessment & Plan Note (Signed)
Reviewed patinets medications and provided refills for Omeprazole and Rizatriptan as requested.

## 2022-10-29 NOTE — Addendum Note (Signed)
Addended by: Arta Silence on: 10/29/2022 02:50 PM   Modules accepted: Orders

## 2022-10-31 NOTE — Progress Notes (Unsigned)
Subjective:   Vicki [name] is a 57 y.o. female who presents for Medicare Annual (Subsequent) preventive examination.  Visit Complete: {VISITMETHOD:[id number]}  Patient Medicare AWV questionnaire was completed by the patient on ***; I have confirmed that all information answered by patient is correct and no changes since this date.  Review of Systems    ***       Objective:    There were no vitals filed for this visit. There is no height or weight on file to calculate BMI.     04/12/2022    5:35 PM 04/26/2021    8:10 PM 10/18/2020    9:20 AM 08/31/2020    9:20 AM 08/23/2020    9:15 AM 01/21/2020   12:31 PM 04/07/2019    2:45 PM  Advanced Directives  Does Patient Have a Medical Advance Directive? Yes Yes No Yes Yes Yes No  Type of Estate agent of [place];Living will Living will  Living will;Healthcare Power of [employer] Power of [place];Living will   Does patient want to make changes to medical advance directive? No - Patient declined No - Patient declined  No - Patient declined     Copy of Healthcare Power of Attorney in Chart? No - copy requested   No - copy requested No - copy requested No - copy requested   Would patient like information on creating a medical advance directive?  No - Guardian declined No - Patient declined    No - Patient declined    Current Medications (verified) Outpatient Encounter Medications as of 11/01/2022  Medication Sig   acetaminophen (TYLENOL) 500 MG tablet Take 1,000 mg by mouth every 6 (six) hours as needed for moderate pain.   albuterol (VENTOLIN HFA) 108 (90 Base) MCG/ACT inhaler INHALE 2 PUFFS INTO THE LUNGS TWICE A DAY (Patient taking differently: Inhale 2 puffs into the lungs every 4 (four) hours.)   ALPRAZolam (XANAX) 1 MG tablet TAKE 1 TABLET BY MOUTH THREE TIMES A DAY AS NEEDED FOR ANXIETY Strength: 1 mg   amLODipine (NORVASC) 5 MG tablet Take 1 tablet (5 mg total) by mouth daily.   ASPIRIN LOW  DOSE 81 MG EC tablet TAKE 1 TABLET BY MOUTH 2 TIMES DAILY. (Patient taking differently: Take 81 mg by mouth in the morning and at bedtime.)   azelastine (ASTELIN) 0.1 % nasal spray Place 1 spray into both nostrils 2 (two) times daily. Use in each nostril as directed (Patient taking differently: Place 1 spray into both nostrils daily.)   EPINEPHrine (EPIPEN 2-PAK) 0.3 mg/0.3 mL IJ SOAJ injection Inject 0.3 mg into the muscle as needed for anaphylaxis.   gabapentin (NEURONTIN) 300 MG capsule Take 1 capsule (300 mg total) by mouth 2 (two) times daily. (Patient taking differently: Take 300 mg by mouth 3 (three) times daily.)   hydrOXYzine (ATARAX) 25 MG tablet TAKE 0.5-1 TABLETS (12.5-25 MG TOTAL) BY MOUTH 3 (THREE) TIMES DAILY AS NEEDED FOR ANXIETY.   ipratropium-albuterol (DUONEB) 0.5-2.5 (3) MG/3ML SOLN Use 1 vial by nebulization in the morning and at bedtime.   Magnesium Citrate 100 MG TABS Take 1 tablet by mouth at bedtime. (Patient taking differently: Take 100 mg by mouth at bedtime.)   meclizine (ANTIVERT) 25 MG tablet Take 1 tablet (25 mg total) by mouth 3 (three) times daily as needed for dizziness.   omeprazole (PRILOSEC) 20 MG capsule Take 1 capsule (20 mg total) by mouth daily.   ondansetron (ZOFRAN) 8 MG tablet Take 1 tablet (  8 mg total) by mouth every 8 (eight) hours as needed for nausea or vomiting.   potassium chloride SA (KLOR-CON M) 20 MEQ tablet Take 1 tablet (20 mEq total) by mouth daily.   prazosin (MINIPRESS) 1 MG capsule TAKE 1 TO 2 CAPSULES (1 TO 2 MG TOTAL) BY MOUTH AT BEDTIME. (Patient taking differently: Take 1-2 mg by mouth at bedtime.)   promethazine (PHENERGAN) 25 MG tablet Take 1 tablet (25 mg total) by mouth every 8 (eight) hours as needed. (Patient taking differently: Take 25 mg by mouth every 8 (eight) hours as needed for nausea or vomiting.)   rizatriptan (MAXALT) 10 MG tablet Take 1 tablet (10 mg total) by mouth daily as needed for migraine. TAKE 1 TABLET BY MOUTH AS  NEEDED FOR MIGRAINE. MAY REPEAT IN 2 HOURS IF NEEDED Strength: 10 mg   rOPINIRole (REQUIP) 2 MG tablet Take 0.5-1 tablets (1-2 mg total) by mouth at bedtime.   traZODone (DESYREL) 50 MG tablet Take 1 tablet (50 mg total) by mouth at bedtime.   Varenicline Tartrate, Starter, (CHANTIX STARTING MONTH PAK) 0.5 MG X 11 & 1 MG X 42 TBPK Per pack instructions   No facility-administered encounter medications on file as of 11/01/2022.    Allergies (verified) Jasmine oil, Levaquin [levofloxacin], Penicillins, Aleve [naproxen sodium], Ceftin [cefuroxime axetil], Chlorhexidine, Depo-provera [medroxyprogesterone acetate], Doxycycline, Oxycodone, and Tramadol   History: Past Medical History:  Diagnosis Date   Allergy    seasonal allergies   Anxiety    on meds   Arthritis    hands   Asthma    childhood-current   CAP (community acquired pneumonia) 04/07/2019   Carpal tunnel syndrome    had bilateral sx to tx   Cataract    bilateral--no sx yet   COPD (chronic obstructive pulmonary disease) (HCC)    Depression    on meds   Diverticulitis    Emphysema of lung (HCC)    uses MDI   Essential hypertension, benign 07/14/2019   on meds   Hyperlipidemia    on meds   Oxygen deficiency    on 5 L cont [place]   Oxygen dependent    on 5 L cont [place]   Past Surgical History:  Procedure Laterality Date   ABDOMINAL HYSTERECTOMY  2009   APPENDECTOMY     ARTHROSCOPIC REPAIR ACL Left 2011/2014   x 2   BIOPSY  01/21/2020   Procedure: BIOPSY;  Surgeon: Napoleon Form, MD;  Location: [facility];  Service: Endoscopy;;  EGD and COLON   BUNIONECTOMY  2006   both   CARPAL TUNNEL RELEASE Bilateral 10/28/2013   Procedure: BILATERAL CARPAL TUNNEL RELEASE;  Surgeon: Nicki Reaper, MD;  Location: [facility];  Service: Orthopedics;  Laterality: Bilateral;   CHOLECYSTECTOMY  1994   CHONDROPLASTY Right 08/31/2020   Procedure: CHONDROPLASTY;  Surgeon: Frederico Hamman, MD;  Location: [facility];  Service: Orthopedics;  Laterality: Right;   COLONOSCOPY WITH PROPOFOL N/A 01/21/2020   Procedure: COLONOSCOPY WITH PROPOFOL;  Surgeon: Napoleon Form, MD;  Location: [facility];  Service: Endoscopy;  Laterality: N/A;   DILATION AND CURETTAGE OF UTERUS     ESOPHAGOGASTRODUODENOSCOPY (EGD) WITH PROPOFOL N/A 01/21/2020   Procedure: ESOPHAGOGASTRODUODENOSCOPY (EGD) WITH PROPOFOL;  Surgeon: Napoleon Form, MD;  Location: [facility];  Service: Endoscopy;  Laterality: N/A;   KNEE ARTHROSCOPY WITH ANTERIOR CRUCIATE LIGAMENT (ACL) REPAIR Right 08/31/2020   Procedure: RIGHT KNEE ARTHROSCOPY WITH ANTERIOR CRUCIATE LIGAMENT (ACL) REPAIR MEDIAL  AND LATERAL  MENISECTOMY;  Surgeon: Frederico Hamman, MD;  Location: [facility];  Service: Orthopedics;  Laterality: Right;  FEMORAL NERVE BLOCK   POLYPECTOMY  01/21/2020   Procedure: POLYPECTOMY;  Surgeon: Napoleon Form, MD;  Location: [facility];  Service: Endoscopy;;   TUBAL LIGATION  2004   Family History  Problem Relation Age of Onset   Brain cancer Mother 17   Lung cancer Mother 70   Breast cancer Sister 32   Ovarian cancer Sister 6   Colon cancer Neg Hx    Colon polyps Neg Hx    Esophageal cancer Neg Hx    Rectal cancer Neg Hx    Stomach cancer Neg Hx    Social History   Socioeconomic History   Marital status: Married    Spouse name: Deniece Portela   Number of children: 3   Years of education: 12th grade   Highest education level: Not on file  Occupational History   Occupation: Multimedia programmer: [employer]  Tobacco Use   Smoking status: Every Day    Packs/day: 1.00    Years: 34.00    Additional pack years: 0.00    Total pack years: 34.00    Types: Cigarettes   Smokeless tobacco: Never   Tobacco comments:    6 cigarettes a day   Vaping Use   Vaping Use: Never used  Substance and Sexual Activity   Alcohol use: Yes    Comment: everyday   Drug use: No   Sexual activity: Yes    Birth  control/protection: Surgical  Other Topics Concern   Not on file  Social History Narrative   Lives with her husband.   Their 3 children live independently.   Social Determinants of Health   Financial Resource Strain: Not on file  Food Insecurity: No Food Insecurity (04/12/2022)   Hunger Vital Sign    Worried About Running Out of Food in the Last Year: Never true    Ran Out of Food in the Last Year: Never true  Transportation Needs: No Transportation Needs (04/12/2022)   PRAPARE - Administrator, Civil Service (Medical): No    Lack of Transportation (Non-Medical): No  Physical Activity: Not on file  Stress: Not on file  Social Connections: Not on file    Tobacco Counseling Ready to quit: Not Answered Counseling given: Not Answered Tobacco comments: 6 cigarettes a day    Clinical Intake:                        Activities of Daily Living    04/12/2022    5:39 PM 04/12/2022    5:37 PM  In your present state of health, do you have any difficulty performing the following activities:  Hearing?  0  Vision?  0  Difficulty concentrating or making decisions?  0  Walking or climbing stairs?  1  Dressing or bathing?  0  Doing errands, shopping? 0     Patient Care Team: Park Meo, FNP as PCP - General (Family Medicine)  Indicate any recent Medical Services you may have received from other than [facility] providers in the past year (date may be approximate).     Assessment:   This is a routine wellness examination for [place].  Hearing/Vision screen No results found.  Dietary issues and exercise activities discussed:     Goals Addressed   None    Depression Screen    10/29/2022  2:49 PM 04/18/2022    3:49 PM 07/10/2021   12:44 PM 05/09/2021    1:18 PM 12/27/2020   10:29 AM 09/14/2020    9:41 AM 06/13/2020   11:30 AM  PHQ 2/9 Scores  PHQ - 2 Score 2 0 5 0 0 0 0  PHQ- 9 Score  4 17 5  0      Fall Risk    10/29/2022    2:49 PM  04/18/2022    3:49 PM 10/10/2021   11:18 AM 07/10/2021   12:43 PM 05/09/2021    1:17 PM  Fall Risk   Falls in the past year? 0 0 1 1 0  Number falls in past yr: 0 0 0 1 0  Injury with Fall? 0 0 1 0 0  Risk for fall due to : No Fall Risks No Fall Risks History of fall(s) History of fall(s) No Fall Risks  Follow up   Falls evaluation completed Falls evaluation completed Falls evaluation completed    MEDICARE RISK AT HOME:   TIMED UP AND GO:  Was the test performed?  No    Cognitive Function:        Immunizations Immunization History  Administered Date(s) Administered   Influenza Inj Mdck Quad Pf 02/25/2017, 01/09/2018   Influenza,inj,Quad PF,6+ Mos 02/02/2015, 01/28/2019, 04/16/2022   Influenza-Unspecified 02/18/2017, 01/09/2018, 03/23/2020, 04/14/2021   PFIZER(Purple Top)SARS-COV-2 Vaccination 08/06/2019, 08/29/2019, 03/22/2020   Pneumococcal Polysaccharide-23 03/12/2019, 04/16/2022   Tdap 05/14/2010, 06/13/2020   Zoster Recombinat (Shingrix) 03/12/2019    TDAP status: Up to date  Pneumococcal vaccine status: Up to date  Covid-19 vaccine status: Information provided on how to obtain vaccines.   Qualifies for Shingles Vaccine? Yes   Zostavax completed No   Shingrix Completed?: No.    Education has been provided regarding the importance of this vaccine. Patient has been advised to call insurance company to determine out of pocket expense if they have not yet received this vaccine. Advised may also receive vaccine at local pharmacy or Health Dept. Verbalized acceptance and understanding.  Screening Tests Health Maintenance  Topic Date Due   Medicare Annual Wellness (AWV)  Never done   MAMMOGRAM  04/22/2021   INFLUENZA VACCINE  12/13/2022   Lung Cancer Screening  07/12/2023   DTaP/Tdap/Td (3 - Td or Tdap) 06/13/2030   Hepatitis C Screening  Completed   HIV Screening  Completed   HPV VACCINES  Aged Out   PAP SMEAR-Modifier  Discontinued   Colonoscopy  Discontinued    COVID-19 Vaccine  Discontinued   Fecal DNA (Cologuard)  Discontinued   Zoster Vaccines- Shingrix  Discontinued    Health Maintenance  Health Maintenance Due  Topic Date Due   Medicare Annual Wellness (AWV)  Never done   MAMMOGRAM  04/22/2021    Colorectal cancer screening: Type of screening: Colonoscopy. Completed 01/21/20. Repeat every 5-10 years  Mammogram status: Ordered and scheduled for 11/13/22. Pt provided with contact info and advised to call to schedule appt.   Lung Cancer Screening: (Low Dose CT Chest recommended if Age 35-80 years, 20 pack-year currently smoking OR have quit w/in 15years.) does qualify.   Lung Cancer Screening Referral: 07/11/22  Additional Screening:  Hepatitis C Screening: does qualify; Completed 03/12/19  Vision Screening: Recommended annual ophthalmology exams for early detection of glaucoma and other disorders of the eye. Is the patient up to date with their annual eye exam?  {YES/NO:21197} Who is the provider or what is the name of the office in which the  patient attends annual eye exams? *** If pt is not established with a provider, would they like to be referred to a provider to establish care? {YES/NO:21197}.   Dental Screening: Recommended annual dental exams for proper oral hygiene  Community Resource Referral / Chronic Care Management: CRR required this visit?  {YES/NO:21197}  CCM required this visit?  {CCM Required choices:[phone]}     Plan:     I have personally reviewed and noted the following in the patient's chart:   Medical and social history Use of alcohol, tobacco or illicit drugs  Current medications and supplements including opioid prescriptions. {Opioid [phone]} Functional ability and status Nutritional status Physical activity Advanced directives List of other physicians Hospitalizations, surgeries, and ER visits in previous 12 months Vitals Screenings to include cognitive, depression, and  falls Referrals and appointments  In addition, I have reviewed and discussed with patient certain preventive protocols, quality metrics, and best practice recommendations. A written personalized care plan for preventive services as well as general preventive health recommendations were provided to patient.     Kandis Fantasia [place], [place]   08/20/8117   After Visit Summary: (MyChart) Due to this being a telephonic visit, the after visit summary with patients personalized plan was offered to patient via MyChart   Nurse Notes: ***

## 2022-10-31 NOTE — Patient Instructions (Incomplete)
Vicki [name] , Thank you for taking time to come for your Medicare Wellness Visit. I appreciate your ongoing commitment to your health goals. Please review the following plan we discussed and let me know if I can assist you in the future.   These are the goals we discussed:  Goals   None     This is a list of the screening recommended for you and due dates:  Health Maintenance  Topic Date Due   Medicare Annual Wellness Visit  Never done   Mammogram  04/22/2021   Flu Shot  12/13/2022   Screening for Lung Cancer  07/12/2023   DTaP/Tdap/Td vaccine (3 - Td or Tdap) 06/13/2030   Hepatitis C Screening  Completed   HIV Screening  Completed   HPV Vaccine  Aged Out   Pap Smear  Discontinued   Colon Cancer Screening  Discontinued   COVID-19 Vaccine  Discontinued   Cologuard (Stool DNA test)  Discontinued   Zoster (Shingles) Vaccine  Discontinued    Advanced directives: Information on Advanced Care Planning can be found at [facility] Advance Health Care Directives Advance Health Care Directives ([email or web address] Please bring a copy of your health care power of attorney and living will to the office to be added to your chart at your convenience.  Conditions/risks identified: Aim for 30 minutes of exercise or brisk walking, 6-8 glasses of water, and 5 servings of fruits and vegetables each day.  Next appointment: Follow up in one year for your annual wellness visit.   Preventive Care 40-64 Years, Female Preventive care refers to lifestyle choices and visits with your health care provider that can promote health and wellness. What does preventive care include? A yearly physical exam. This is also called an annual well check. Dental exams once or twice a year. Routine eye exams. Ask your health care provider how often you should have your eyes checked. Personal lifestyle choices, including: Daily care of your teeth and gums. Regular physical activity. Eating a healthy  diet. Avoiding tobacco and drug use. Limiting alcohol use. Practicing safe sex. Taking low-dose aspirin daily starting at age 75. Taking vitamin and mineral supplements as recommended by your health care provider. What happens during an annual well check? The services and screenings done by your health care provider during your annual well check will depend on your age, overall health, lifestyle risk factors, and family history of disease. Counseling  Your health care provider may ask you questions about your: Alcohol use. Tobacco use. Drug use. Emotional well-being. Home and relationship well-being. Sexual activity. Eating habits. Work and work Astronomer. Method of birth control. Menstrual cycle. Pregnancy history. Screening  You may have the following tests or measurements: Height, weight, and BMI. Blood pressure. Lipid and cholesterol levels. These may be checked every 5 years, or more frequently if you are over 52 years old. Skin check. Lung cancer screening. You may have this screening every year starting at age 36 if you have a 30-pack-year history of smoking and currently smoke or have quit within the past 15 years. Fecal occult blood test (FOBT) of the stool. You may have this test every year starting at age 38. Flexible sigmoidoscopy or colonoscopy. You may have a sigmoidoscopy every 5 years or a colonoscopy every 10 years starting at age 30. Hepatitis C blood test. Hepatitis B blood test. Sexually transmitted disease (STD) testing. Diabetes screening. This is done by checking your blood sugar (glucose) after you have not eaten for  a while (fasting). You may have this done every 1-3 years. Mammogram. This may be done every 1-2 years. Talk to your health care provider about when you should start having regular mammograms. This may depend on whether you have a family history of breast cancer. BRCA-related cancer screening. This may be done if you have a family history of  breast, ovarian, tubal, or peritoneal cancers. Pelvic exam and Pap test. This may be done every 3 years starting at age 16. Starting at age 10, this may be done every 5 years if you have a Pap test in combination with an HPV test. Bone density scan. This is done to screen for osteoporosis. You may have this scan if you are at high risk for osteoporosis. Discuss your test results, treatment options, and if necessary, the need for more tests with your health care provider. Vaccines  Your health care provider may recommend certain vaccines, such as: Influenza vaccine. This is recommended every year. Tetanus, diphtheria, and acellular pertussis (Tdap, Td) vaccine. You may need a Td booster every 10 years. Zoster vaccine. You may need this after age 28. Pneumococcal 13-valent conjugate (PCV13) vaccine. You may need this if you have certain conditions and were not previously vaccinated. Pneumococcal polysaccharide (PPSV23) vaccine. You may need one or two doses if you smoke cigarettes or if you have certain conditions. Talk to your health care provider about which screenings and vaccines you need and how often you need them. This information is not intended to replace advice given to you by your health care provider. Make sure you discuss any questions you have with your health care provider. Document Released: 05/27/2015 Document Revised: 01/18/2016 Document Reviewed: 03/01/2015 Elsevier Interactive Patient Education  2017 [employer].    Fall Prevention in the Home Falls can cause injuries. They can happen to people of all ages. There are many things you can do to make your home safe and to help prevent falls. What can I do on the outside of my home? Regularly fix the edges of walkways and driveways and fix any cracks. Remove anything that might make you trip as you walk through a door, such as a raised step or threshold. Trim any bushes or trees on the path to your home. Use bright outdoor  lighting. Clear any walking paths of anything that might make someone trip, such as rocks or tools. Regularly check to see if handrails are loose or broken. Make sure that both sides of any steps have handrails. Any raised decks and porches should have guardrails on the edges. Have any leaves, snow, or ice cleared regularly. Use sand or salt on walking paths during winter. Clean up any spills in your garage right away. This includes oil or grease spills. What can I do in the bathroom? Use night lights. Install grab bars by the toilet and in the tub and shower. Do not use towel bars as grab bars. Use non-skid mats or decals in the tub or shower. If you need to sit down in the shower, use a plastic, non-slip stool. Keep the floor dry. Clean up any water that spills on the floor as soon as it happens. Remove soap buildup in the tub or shower regularly. Attach bath mats securely with double-sided non-slip rug tape. Do not have throw rugs and other things on the floor that can make you trip. What can I do in the bedroom? Use night lights. Make sure that you have a light by your bed that is  easy to reach. Do not use any sheets or blankets that are too big for your bed. They should not hang down onto the floor. Have a firm chair that has side arms. You can use this for support while you get dressed. Do not have throw rugs and other things on the floor that can make you trip. What can I do in the kitchen? Clean up any spills right away. Avoid walking on wet floors. Keep items that you use a lot in easy-to-reach places. If you need to reach something above you, use a strong step stool that has a grab bar. Keep electrical cords out of the way. Do not use floor polish or wax that makes floors slippery. If you must use wax, use non-skid floor wax. Do not have throw rugs and other things on the floor that can make you trip. What can I do with my stairs? Do not leave any items on the stairs. Make  sure that there are handrails on both sides of the stairs and use them. Fix handrails that are broken or loose. Make sure that handrails are as long as the stairways. Check any carpeting to make sure that it is firmly attached to the stairs. Fix any carpet that is loose or worn. Avoid having throw rugs at the top or bottom of the stairs. If you do have throw rugs, attach them to the floor with carpet tape. Make sure that you have a light switch at the top of the stairs and the bottom of the stairs. If you do not have them, ask someone to add them for you. What else can I do to help prevent falls? Wear shoes that: Do not have high heels. Have rubber bottoms. Are comfortable and fit you well. Are closed at the toe. Do not wear sandals. If you use a stepladder: Make sure that it is fully opened. Do not climb a closed stepladder. Make sure that both sides of the stepladder are locked into place. Ask someone to hold it for you, if possible. Clearly mark and make sure that you can see: Any grab bars or handrails. First and last steps. Where the edge of each step is. Use tools that help you move around (mobility aids) if they are needed. These include: Canes. Walkers. Scooters. Crutches. Turn on the lights when you go into a dark area. Replace any light bulbs as soon as they burn out. Set up your furniture so you have a clear path. Avoid moving your furniture around. If any of your floors are uneven, fix them. If there are any pets around you, be aware of where they are. Review your medicines with your doctor. Some medicines can make you feel dizzy. This can increase your chance of falling. Ask your doctor what other things that you can do to help prevent falls. This information is not intended to replace advice given to you by your health care provider. Make sure you discuss any questions you have with your health care provider. Document Released: 02/24/2009 Document Revised: 10/06/2015  Document Reviewed: 06/04/2014 Elsevier Interactive Patient Education  2017 [employer].

## 2022-11-01 ENCOUNTER — Ambulatory Visit (INDEPENDENT_AMBULATORY_CARE_PROVIDER_SITE_OTHER): Payer: Medicare HMO

## 2022-11-01 VITALS — Ht 63.0 in | Wt 163.0 lb

## 2022-11-01 DIAGNOSIS — Z0000 Encounter for general adult medical examination without abnormal findings: Secondary | ICD-10-CM

## 2022-11-13 ENCOUNTER — Ambulatory Visit: Payer: Medicare HMO

## 2022-12-04 ENCOUNTER — Ambulatory Visit
Admission: RE | Admit: 2022-12-04 | Discharge: 2022-12-04 | Disposition: A | Discharge status: Home / Self Care | Payer: Medicare HMO | Source: Ambulatory Visit | Attending: Family Medicine | Admitting: Family Medicine

## 2022-12-04 DIAGNOSIS — Z1231 Encounter for screening mammogram for malignant neoplasm of breast: Secondary | ICD-10-CM | POA: Diagnosis not present

## 2022-12-05 ENCOUNTER — Other Ambulatory Visit: Payer: Self-pay | Admitting: Family Medicine

## 2022-12-05 DIAGNOSIS — G43909 Migraine, unspecified, not intractable, without status migrainosus: Secondary | ICD-10-CM

## 2022-12-22 ENCOUNTER — Encounter (HOSPITAL_COMMUNITY): Payer: Self-pay

## 2022-12-22 ENCOUNTER — Inpatient Hospital Stay (HOSPITAL_COMMUNITY)
Admission: EM | Admit: 2022-12-22 | Discharge: 2022-12-27 | DRG: 392 | Disposition: A | Discharge status: Home / Self Care | Payer: Medicare HMO | Attending: Internal Medicine | Admitting: Internal Medicine

## 2022-12-22 ENCOUNTER — Emergency Department (HOSPITAL_COMMUNITY): Payer: Medicare HMO

## 2022-12-22 DIAGNOSIS — F17210 Nicotine dependence, cigarettes, uncomplicated: Secondary | ICD-10-CM | POA: Diagnosis present

## 2022-12-22 DIAGNOSIS — K5792 Diverticulitis of intestine, part unspecified, without perforation or abscess without bleeding: Secondary | ICD-10-CM | POA: Diagnosis not present

## 2022-12-22 DIAGNOSIS — J9611 Chronic respiratory failure with hypoxia: Secondary | ICD-10-CM | POA: Diagnosis present

## 2022-12-22 DIAGNOSIS — Z1152 Encounter for screening for COVID-19: Secondary | ICD-10-CM | POA: Diagnosis not present

## 2022-12-22 DIAGNOSIS — K760 Fatty (change of) liver, not elsewhere classified: Secondary | ICD-10-CM | POA: Diagnosis not present

## 2022-12-22 DIAGNOSIS — I10 Essential (primary) hypertension: Secondary | ICD-10-CM | POA: Diagnosis not present

## 2022-12-22 DIAGNOSIS — E861 Hypovolemia: Secondary | ICD-10-CM | POA: Diagnosis present

## 2022-12-22 DIAGNOSIS — J439 Emphysema, unspecified: Secondary | ICD-10-CM | POA: Diagnosis not present

## 2022-12-22 DIAGNOSIS — E875 Hyperkalemia: Secondary | ICD-10-CM | POA: Diagnosis present

## 2022-12-22 DIAGNOSIS — E876 Hypokalemia: Secondary | ICD-10-CM | POA: Diagnosis not present

## 2022-12-22 DIAGNOSIS — J4489 Other specified chronic obstructive pulmonary disease: Secondary | ICD-10-CM | POA: Diagnosis not present

## 2022-12-22 DIAGNOSIS — D6959 Other secondary thrombocytopenia: Secondary | ICD-10-CM | POA: Diagnosis not present

## 2022-12-22 DIAGNOSIS — Z720 Tobacco use: Secondary | ICD-10-CM | POA: Diagnosis present

## 2022-12-22 DIAGNOSIS — R519 Headache, unspecified: Secondary | ICD-10-CM | POA: Diagnosis present

## 2022-12-22 DIAGNOSIS — S0990XA Unspecified injury of head, initial encounter: Secondary | ICD-10-CM | POA: Diagnosis present

## 2022-12-22 DIAGNOSIS — K529 Noninfective gastroenteritis and colitis, unspecified: Secondary | ICD-10-CM | POA: Diagnosis not present

## 2022-12-22 DIAGNOSIS — Z9981 Dependence on supplemental oxygen: Secondary | ICD-10-CM

## 2022-12-22 DIAGNOSIS — D649 Anemia, unspecified: Secondary | ICD-10-CM | POA: Diagnosis not present

## 2022-12-22 DIAGNOSIS — Z8701 Personal history of pneumonia (recurrent): Secondary | ICD-10-CM

## 2022-12-22 DIAGNOSIS — Z79899 Other long term (current) drug therapy: Secondary | ICD-10-CM | POA: Diagnosis not present

## 2022-12-22 DIAGNOSIS — R1031 Right lower quadrant pain: Secondary | ICD-10-CM | POA: Diagnosis not present

## 2022-12-22 DIAGNOSIS — Z803 Family history of malignant neoplasm of breast: Secondary | ICD-10-CM

## 2022-12-22 DIAGNOSIS — E785 Hyperlipidemia, unspecified: Secondary | ICD-10-CM | POA: Diagnosis present

## 2022-12-22 DIAGNOSIS — R109 Unspecified abdominal pain: Principal | ICD-10-CM

## 2022-12-22 DIAGNOSIS — W19XXXA Unspecified fall, initial encounter: Secondary | ICD-10-CM | POA: Diagnosis present

## 2022-12-22 DIAGNOSIS — R6889 Other general symptoms and signs: Secondary | ICD-10-CM | POA: Diagnosis not present

## 2022-12-22 DIAGNOSIS — Z9049 Acquired absence of other specified parts of digestive tract: Secondary | ICD-10-CM

## 2022-12-22 DIAGNOSIS — E871 Hypo-osmolality and hyponatremia: Secondary | ICD-10-CM | POA: Diagnosis not present

## 2022-12-22 DIAGNOSIS — Z90710 Acquired absence of both cervix and uterus: Secondary | ICD-10-CM

## 2022-12-22 DIAGNOSIS — R296 Repeated falls: Secondary | ICD-10-CM | POA: Diagnosis present

## 2022-12-22 DIAGNOSIS — Z881 Allergy status to other antibiotic agents status: Secondary | ICD-10-CM

## 2022-12-22 DIAGNOSIS — R112 Nausea with vomiting, unspecified: Secondary | ICD-10-CM

## 2022-12-22 DIAGNOSIS — K5720 Diverticulitis of large intestine with perforation and abscess without bleeding: Secondary | ICD-10-CM | POA: Diagnosis present

## 2022-12-22 DIAGNOSIS — F419 Anxiety disorder, unspecified: Secondary | ICD-10-CM | POA: Diagnosis present

## 2022-12-22 DIAGNOSIS — Z886 Allergy status to analgesic agent status: Secondary | ICD-10-CM

## 2022-12-22 DIAGNOSIS — Z808 Family history of malignant neoplasm of other organs or systems: Secondary | ICD-10-CM

## 2022-12-22 DIAGNOSIS — K5730 Diverticulosis of large intestine without perforation or abscess without bleeding: Secondary | ICD-10-CM | POA: Diagnosis not present

## 2022-12-22 DIAGNOSIS — Z888 Allergy status to other drugs, medicaments and biological substances status: Secondary | ICD-10-CM

## 2022-12-22 DIAGNOSIS — Z885 Allergy status to narcotic agent status: Secondary | ICD-10-CM

## 2022-12-22 DIAGNOSIS — F32A Depression, unspecified: Secondary | ICD-10-CM | POA: Diagnosis not present

## 2022-12-22 DIAGNOSIS — Z880 Allergy status to penicillin: Secondary | ICD-10-CM

## 2022-12-22 DIAGNOSIS — J449 Chronic obstructive pulmonary disease, unspecified: Secondary | ICD-10-CM | POA: Diagnosis present

## 2022-12-22 DIAGNOSIS — Z8041 Family history of malignant neoplasm of ovary: Secondary | ICD-10-CM

## 2022-12-22 DIAGNOSIS — Z9109 Other allergy status, other than to drugs and biological substances: Secondary | ICD-10-CM

## 2022-12-22 DIAGNOSIS — D696 Thrombocytopenia, unspecified: Secondary | ICD-10-CM | POA: Diagnosis not present

## 2022-12-22 DIAGNOSIS — Z7982 Long term (current) use of aspirin: Secondary | ICD-10-CM | POA: Diagnosis not present

## 2022-12-22 DIAGNOSIS — Z87892 Personal history of anaphylaxis: Secondary | ICD-10-CM

## 2022-12-22 DIAGNOSIS — Z801 Family history of malignant neoplasm of trachea, bronchus and lung: Secondary | ICD-10-CM

## 2022-12-22 DIAGNOSIS — Z8619 Personal history of other infectious and parasitic diseases: Secondary | ICD-10-CM

## 2022-12-22 LAB — COMPREHENSIVE METABOLIC PANEL
ALT: 23 U/L (ref 0–44)
AST: 64 U/L — ABNORMAL HIGH (ref 15–41)
Albumin: 3.9 g/dL (ref 3.5–5.0)
Alkaline Phosphatase: 124 U/L (ref 38–126)
Anion gap: 16 — ABNORMAL HIGH (ref 5–15)
BUN: 17 mg/dL (ref 6–20)
CO2: 19 mmol/L — ABNORMAL LOW (ref 22–32)
Calcium: 8.3 mg/dL — ABNORMAL LOW (ref 8.9–10.3)
Chloride: 99 mmol/L (ref 98–111)
Creatinine, Ser: 1.05 mg/dL — ABNORMAL HIGH (ref 0.44–1.00)
GFR, Estimated: >60 mL/min (ref >60)
Glucose, Bld: 100 mg/dL — ABNORMAL HIGH (ref 70–99)
Potassium: 5.3 mmol/L — ABNORMAL HIGH (ref 3.5–5.1)
Sodium: 134 mmol/L — ABNORMAL LOW (ref 135–145)
Total Bilirubin: 1.5 mg/dL — ABNORMAL HIGH (ref 0.3–1.2)
Total Protein: 6.6 g/dL (ref 6.5–8.1)

## 2022-12-22 LAB — URINALYSIS, W/ REFLEX TO CULTURE (INFECTION SUSPECTED)
Bacteria, UA: NONE SEEN
Bilirubin Urine: NEGATIVE
Glucose, UA: NEGATIVE mg/dL
Hgb urine dipstick: NEGATIVE
Ketones, ur: 20 mg/dL — AB
Leukocytes,Ua: NEGATIVE
Nitrite: NEGATIVE
Protein, ur: NEGATIVE mg/dL
Specific Gravity, Urine: 1.016 (ref 1.005–1.030)
pH: 5 (ref 5.0–8.0)

## 2022-12-22 LAB — CBC WITH DIFFERENTIAL/PLATELET
Abs Immature Granulocytes: 0.03 10*3/uL (ref 0.00–0.07)
Basophils Absolute: 0.1 10*3/uL (ref 0.0–0.1)
Basophils Relative: 1 %
Eosinophils Absolute: 0 10*3/uL (ref 0.0–0.5)
Eosinophils Relative: 0 %
HCT: 43.2 % (ref 36.0–46.0)
Hemoglobin: 14.7 g/dL (ref 12.0–15.0)
Immature Granulocytes: 0 %
Lymphocytes Relative: 17 %
Lymphs Abs: 1.7 10*3/uL (ref 0.7–4.0)
MCH: 32.6 pg (ref 26.0–34.0)
MCHC: 34 g/dL (ref 30.0–36.0)
MCV: 95.8 fL (ref 80.0–100.0)
Monocytes Absolute: 0.7 10*3/uL (ref 0.1–1.0)
Monocytes Relative: 7 %
Neutro Abs: 7.2 10*3/uL (ref 1.7–7.7)
Neutrophils Relative %: 75 %
Platelets: 216 10*3/uL (ref 150–400)
RBC: 4.51 MIL/uL (ref 3.87–5.11)
RDW: 12.4 % (ref 11.5–15.5)
WBC: 9.7 10*3/uL (ref 4.0–10.5)
nRBC: 0 % (ref 0.0–0.2)

## 2022-12-22 LAB — I-STAT CG4 LACTIC ACID, ED
Lactic Acid, Venous: 1.3 mmol/L (ref 0.5–1.9)
Lactic Acid, Venous: 1.1 mmol/L (ref 0.5–1.9)

## 2022-12-22 LAB — C DIFFICILE QUICK SCREEN W PCR REFLEX
C Diff antigen: NEGATIVE
C Diff interpretation: NOT DETECTED
C Diff toxin: NEGATIVE

## 2022-12-22 LAB — LIPASE, BLOOD: Lipase: 30 U/L (ref 11–51)

## 2022-12-22 MED ORDER — HYDROMORPHONE HCL 1 MG/ML IJ SOLN
1.0000 mg | Freq: Once | INTRAMUSCULAR | Status: AC
  Administered 2022-12-22: 1 mg via INTRAVENOUS
  Filled 2022-12-22: qty 1

## 2022-12-22 MED ORDER — METOCLOPRAMIDE HCL 5 MG/ML IJ SOLN
10.0000 mg | Freq: Once | INTRAMUSCULAR | Status: AC
  Administered 2022-12-22: 10 mg via INTRAVENOUS
  Filled 2022-12-22: qty 2

## 2022-12-22 MED ORDER — LACTATED RINGERS IV SOLN: INTRAVENOUS | Status: DC

## 2022-12-22 MED ORDER — HYDROMORPHONE HCL 1 MG/ML IJ SOLN
0.5000 mg | Freq: Once | INTRAMUSCULAR | Status: AC
  Administered 2022-12-22: 0.5 mg via INTRAVENOUS
  Filled 2022-12-22: qty 1

## 2022-12-22 MED ORDER — IOHEXOL 350 MG/ML SOLN
75.0000 mL | Freq: Once | INTRAVENOUS | Status: AC | PRN
  Administered 2022-12-22: 75 mL via INTRAVENOUS

## 2022-12-22 MED ORDER — LACTATED RINGERS IV BOLUS
1000.0000 mL | Freq: Once | INTRAVENOUS | Status: AC
  Administered 2022-12-22: 1000 mL via INTRAVENOUS

## 2022-12-22 MED ORDER — LORAZEPAM 2 MG/ML IJ SOLN
1.0000 mg | Freq: Once | INTRAMUSCULAR | Status: AC
  Administered 2022-12-22: 1 mg via INTRAVENOUS
  Filled 2022-12-22: qty 1

## 2022-12-22 NOTE — ED Provider Notes (Signed)
[facility] Provider Note   CSN: [id number] Arrival date & time: 12/22/22  1821     History {Add pertinent medical, surgical, social history, OB history to HPI:1} Chief Complaint  Patient presents with   Abdominal Pain    Vicki [name] is a 57 y.o. female.  Patient is a 57 year old female with a history of COPD on chronic oxygen 5 L continuous, hypertension, prior C. difficile and diverticulitis who is presenting today with complaints of abdominal pain, nausea vomiting and diarrhea.  Patient reports for years she has had issues with nausea and vomiting and nobody can figure out what is going on but she reports about a month ago she had a fall because she lost her balance and she has ongoing issues with walking and falls intermittently but since that fall where she hit her face directly on the floor she has had worsening headaches.  Yesterday the headache became severe and she started vomiting.  She then developed abdominal pain and loose stools and has been vomiting ever since.  She thinks she vomited more than 20 times at home but has not had fever.  No blood in her emesis or stool.  The pain is mostly in her lower abdomen but she denies any urinary symptoms.  She has had prior surgeries including hysterectomy.No new cough or worsening shortness of breath.  The history is provided by the patient, the spouse and medical records.  Abdominal Pain      Home Medications Prior to Admission medications   Medication Sig Start Date End Date Taking? Authorizing Provider  acetaminophen (TYLENOL) 500 MG tablet Take 1,000 mg by mouth every 6 (six) hours as needed for moderate pain.    [provider]  albuterol (VENTOLIN HFA) 108 (90 Base) MCG/ACT inhaler INHALE 2 PUFFS INTO THE LUNGS TWICE A DAY Patient taking differently: Inhale 2 puffs into the lungs every 4 (four) hours. 11/20/21   [name], NP  ALPRAZolam Prudy Feeler) 1 MG tablet  TAKE 1 TABLET BY MOUTH THREE TIMES A DAY AS NEEDED FOR ANXIETY Strength: 1 mg 09/26/22   Park Meo, FNP  amLODipine (NORVASC) 5 MG tablet Take 1 tablet (5 mg total) by mouth daily. 07/07/21   [name], NP  ASPIRIN LOW DOSE 81 MG EC tablet TAKE 1 TABLET BY MOUTH 2 TIMES DAILY. Patient taking differently: Take 81 mg by mouth in the morning and at bedtime. 03/21/21   [name], NP  azelastine (ASTELIN) 0.1 % nasal spray Place 1 spray into both nostrils 2 (two) times daily. Use in each nostril as directed Patient taking differently: Place 1 spray into both nostrils daily. 07/07/21   [name], NP  EPINEPHrine (EPIPEN 2-PAK) 0.3 mg/0.3 mL IJ SOAJ injection Inject 0.3 mg into the muscle as needed for anaphylaxis. 12/27/20   [name], NP  gabapentin (NEURONTIN) 300 MG capsule Take 1 capsule (300 mg total) by mouth 2 (two) times daily. Patient taking differently: Take 300 mg by mouth 3 (three) times daily. 07/07/21   [name], NP  hydrOXYzine (ATARAX) 25 MG tablet TAKE 0.5-1 TABLETS (12.5-25 MG TOTAL) BY MOUTH 3 (THREE) TIMES DAILY AS NEEDED FOR ANXIETY. 09/18/21   [name], NP  ipratropium-albuterol (DUONEB) 0.5-2.5 (3) MG/3ML SOLN Use 1 vial by nebulization in the morning and at bedtime. 10/10/21   [name], NP  Magnesium Citrate 100 MG TABS Take 1 tablet by mouth at bedtime. Patient taking differently: Take 100 mg by mouth  at bedtime. 10/10/21   [name], NP  meclizine (ANTIVERT) 25 MG tablet Take 1 tablet (25 mg total) by mouth 3 (three) times daily as needed for dizziness. 12/27/20   [name], NP  omeprazole (PRILOSEC) 20 MG capsule Take 1 capsule (20 mg total) by mouth daily. 10/29/22   Park Meo, FNP  ondansetron (ZOFRAN) 8 MG tablet Take 1 tablet (8 mg total) by mouth every 8 (eight) hours as needed for nausea or vomiting. 12/06/21   [name], NP  potassium chloride SA (KLOR-CON M) 20 MEQ tablet Take 1 tablet (20 mEq total) by mouth  daily. 10/10/21   [name], NP  prazosin (MINIPRESS) 1 MG capsule TAKE 1 TO 2 CAPSULES (1 TO 2 MG TOTAL) BY MOUTH AT BEDTIME. Patient taking differently: Take 1-2 mg by mouth at bedtime. 12/27/20   [name], NP  promethazine (PHENERGAN) 25 MG tablet Take 1 tablet (25 mg total) by mouth every 8 (eight) hours as needed. Patient taking differently: Take 25 mg by mouth every 8 (eight) hours as needed for nausea or vomiting. 12/06/21   [name], NP  rizatriptan (MAXALT) 10 MG tablet TAKE 1 TABLET BY MOUTH AS NEEDED FOR MIGRAINE. MAY REPEAT IN 2 HOURS IF NEEDED STRENGTH: 10 MG 12/06/22   Park Meo, FNP  rOPINIRole (REQUIP) 2 MG tablet Take 0.5-1 tablets (1-2 mg total) by mouth at bedtime. 10/10/21   [name], NP  traZODone (DESYREL) 50 MG tablet Take 1 tablet (50 mg total) by mouth at bedtime. 07/07/21   [name], NP  Varenicline Tartrate, Starter, (CHANTIX STARTING MONTH PAK) 0.5 MG X 11 & 1 MG X 42 TBPK Per pack instructions 07/25/22   Glenford Bayley, NP      Allergies    Jasmine oil, Levaquin [levofloxacin], Penicillins, Aleve [naproxen sodium], Ceftin [cefuroxime axetil], Chlorhexidine, Depo-provera [medroxyprogesterone acetate], Doxycycline, Oxycodone, and Tramadol    Review of Systems   Review of Systems  Gastrointestinal:  Positive for abdominal pain.    Physical Exam Updated Vital Signs BP (!) 133/90   Pulse (!) 107   Temp 98.1 F (36.7 C) (Oral)   Resp 17   Ht 5\' 3"  (1.6 m)   Wt 73.9 kg   SpO2 95%   BMI 28.86 kg/m  Physical Exam Vitals and nursing note reviewed.  Constitutional:      General: She is not in acute distress.    Appearance: She is well-developed.     Comments: Appears uncomfortable  HENT:     Head: Normocephalic and atraumatic.  Eyes:     Pupils: Pupils are equal, round, and reactive to light.  Cardiovascular:     Rate and Rhythm: Normal rate and regular rhythm.     Heart sounds: Normal heart sounds. No murmur heard.     No friction rub.  Pulmonary:     Effort: Pulmonary effort is normal.     Breath sounds: Normal breath sounds. No wheezing or rales.  Abdominal:     General: Bowel sounds are normal. There is no distension.     Palpations: Abdomen is soft.     Tenderness: There is abdominal tenderness in the right lower quadrant, suprapubic area and left lower quadrant. There is no guarding or rebound.  Musculoskeletal:        General: No tenderness. Normal range of motion.     Right lower leg: No edema.     Left lower leg: No edema.     Comments: No edema  Skin:    General: Skin is warm and dry.     Findings: No rash.  Neurological:     Mental Status: She is alert and oriented to person, place, and time. Mental status is at baseline.     Cranial Nerves: No cranial nerve deficit.  Psychiatric:        Behavior: Behavior normal.     ED Results / Procedures / Treatments   Labs (all labs ordered are listed, but only abnormal results are displayed) Labs Reviewed  COMPREHENSIVE METABOLIC PANEL - Abnormal; Notable for the following components:      Result Value   Sodium 134 (*)    Potassium 5.3 (*)    CO2 19 (*)    Glucose, Bld 100 (*)    Creatinine, Ser 1.05 (*)    Calcium 8.3 (*)    AST 64 (*)    Total Bilirubin 1.5 (*)    Anion gap 16 (*)    All other components within normal limits  URINALYSIS, W/ REFLEX TO CULTURE (INFECTION SUSPECTED) - Abnormal; Notable for the following components:   Ketones, ur 20 (*)    All other components within normal limits  C DIFFICILE QUICK SCREEN W PCR REFLEX    CBC WITH DIFFERENTIAL/PLATELET  LIPASE, BLOOD  I-STAT CG4 LACTIC ACID, ED  I-STAT CG4 LACTIC ACID, ED    EKG None  Radiology CT Head Wo Contrast  Result Date: 12/22/2022 CLINICAL DATA:  Trauma EXAM: CT HEAD WITHOUT CONTRAST TECHNIQUE: Contiguous axial images were obtained from the base of the skull through the vertex without intravenous contrast. RADIATION DOSE REDUCTION: This exam was  performed according to the departmental dose-optimization program which includes automated exposure control, adjustment of the mA and/or kV according to patient size and/or use of iterative reconstruction technique. COMPARISON:  03/16/2019 FINDINGS: Brain: No evidence of acute infarction, hemorrhage, hydrocephalus, extra-axial collection or mass lesion/mass effect. Old left basal ganglia lacunar infarct versus prominent VR space (series 3/image 14). Vascular: No hyperdense vessel or unexpected calcification. Skull: Normal. Negative for fracture or focal lesion. Sinuses/Orbits: Mild mucosal thickening of the bilateral maxillary sinuses and right sphenoid sinus. Partial opacification of the left sphenoid sinus. Other: None. IMPRESSION: No acute intracranial abnormality. Electronically Signed   By: Charline Bills M.D.   On: 12/22/2022 20:29   CT ABDOMEN PELVIS W CONTRAST  Result Date: 12/22/2022 CLINICAL DATA:  Abdominal pain. EXAM: CT ABDOMEN AND PELVIS WITH CONTRAST TECHNIQUE: Multidetector CT imaging of the abdomen and pelvis was performed using the standard protocol following bolus administration of intravenous contrast. RADIATION DOSE REDUCTION: This exam was performed according to the departmental dose-optimization program which includes automated exposure control, adjustment of the mA and/or kV according to patient size and/or use of iterative reconstruction technique. CONTRAST:  75mL OMNIPAQUE IOHEXOL 350 MG/ML SOLN COMPARISON:  CT abdomen pelvis dated 04/11/2022. FINDINGS: Lower chest: The visualized lung bases are clear. No intra-abdominal free air or free fluid. Hepatobiliary: Fatty liver. No biliary dilation. Cholecystectomy. No retained calcified stone noted in the central CBD. Pancreas: Unremarkable. No pancreatic ductal dilatation or surrounding inflammatory changes. Spleen: Normal in size without focal abnormality. Adrenals/Urinary Tract: The adrenal glands are unremarkable. There is no  hydronephrosis on either side. There is symmetric enhancement and excretion of contrast by both kidneys. The visualized ureters and urinary bladder appear unremarkable. Stomach/Bowel: There is sigmoid diverticulosis with muscular hypertrophy. Minimal perisigmoid haziness, likely chronic. Mild acute diverticulitis is not excluded. Clinical correlation is recommended. No diverticular abscess or perforation. There  is no bowel obstruction. The appendix is normal. Vascular/Lymphatic: Mild aortoiliac atherosclerotic disease. The IVC is unremarkable. No portal venous gas. There is no adenopathy. Reproductive: Hysterectomy.  No adnexal masses. Other: None Musculoskeletal: Osteopenia with degenerative changes of the spine. Avascular necrosis of the right femoral head. No cortical collapse. No acute osseous pathology. IMPRESSION: 1. Sigmoid diverticulosis with muscular hypertrophy. Mild acute diverticulitis is not excluded. 2. Fatty liver. 3.  Aortic Atherosclerosis (ICD10-I70.0). Electronically Signed   By: Elgie Collard M.D.   On: 12/22/2022 20:25    Procedures Procedures  {Document cardiac monitor, telemetry assessment procedure when appropriate:1}  Medications Ordered in ED Medications  LORazepam (ATIVAN) injection 1 mg (has no administration in time range)  lactated ringers infusion (has no administration in time range)  HYDROmorphone (DILAUDID) injection 0.5 mg (has no administration in time range)  metoCLOPramide (REGLAN) injection 10 mg (10 mg Intravenous Given 12/22/22 1859)  HYDROmorphone (DILAUDID) injection 1 mg (1 mg Intravenous Given 12/22/22 1859)  lactated ringers bolus 1,000 mL (0 mLs Intravenous Stopped 12/22/22 1940)  iohexol (OMNIPAQUE) 350 MG/ML injection 75 mL (75 mLs Intravenous Contrast Given 12/22/22 2016)    ED Course/ Medical Decision Making/ A&P   {   Click here for ABCD2, HEART and other calculatorsREFRESH Note before signing :1}                              Medical Decision  Making Amount and/or Complexity of Data Reviewed Labs: ordered. Radiology: ordered.  Risk Prescription drug management.   Pt with multiple medical problems and comorbidities and presenting today with a complaint that caries a high risk for morbidity and mortality.  Here today with headache, ongoing vomiting, abdominal pain and diarrhea.  Patient has lower abdominal pain on exam but also appears dehydrated with recurrent vomiting.  She has had prior abdominal surgeries but based on symptoms lower suspicion for SBO, concern for diverticulitis as she does have a history in the past versus gastroenteritis.  Lower suspicion for urinary pathology at this time.  Patient also has history of recurrent migraines but had a fall and hit her head a month ago with worsening headaches.  Concern for intracranial hemorrhage versus severe migraines which have also cause vomiting.  She does not use alcohol have a history of cirrhosis.  Vital signs are reassuring except for mild tachycardia.  I independently interpreted patient's labs showed a normal lactate, UA without acute findings, CBC within normal limits, CMP with anion gap of 16, AST of 64, normal ALT and creatinine.  No hypokalemia today and lipase is within normal limits.  Imaging was done to rule out above.  I have independently visualized and interpreted pt's images today.  Head CT was negative for evidence of bleed and CT negative for stones or appendicitis.  Radiology reports sigmoid diverticulosis with muscular hypertrophy mild acute diverticulitis cannot be excluded.  C. difficile is negative today.  On repeat evaluation patient reports that nausea and abdominal pain are coming back.  Discussed with patient the results and offered her admission versus another round of meds to see if symptoms improve.  She wanted to try another round of meds first.    {Document critical care time when appropriate:1} {Document review of labs and clinical decision tools ie  heart score, Chads2Vasc2 etc:1}  {Document your independent review of radiology images, and any outside records:1} {Document your discussion with family members, caretakers, and with consultants:1} {Document social determinants of  health affecting pt's care:1} {Document your decision making why or why not admission, treatments were needed:1} Final Clinical Impression(s) / ED Diagnoses Final diagnoses:  None    Rx / DC Orders ED Discharge Orders     None

## 2022-12-22 NOTE — ED Triage Notes (Signed)
Abdominal pain started yesterday. Nausea, vomiting and diarrhea started 4AM today. Hx of c-diff last year and diverticulitis. Fentanyl and 4mg  Zofran given by EMS en route.

## 2022-12-23 DIAGNOSIS — W19XXXA Unspecified fall, initial encounter: Secondary | ICD-10-CM | POA: Diagnosis present

## 2022-12-23 DIAGNOSIS — J4489 Other specified chronic obstructive pulmonary disease: Secondary | ICD-10-CM | POA: Diagnosis present

## 2022-12-23 DIAGNOSIS — E875 Hyperkalemia: Secondary | ICD-10-CM

## 2022-12-23 DIAGNOSIS — R519 Headache, unspecified: Secondary | ICD-10-CM | POA: Diagnosis present

## 2022-12-23 DIAGNOSIS — K529 Noninfective gastroenteritis and colitis, unspecified: Secondary | ICD-10-CM | POA: Diagnosis present

## 2022-12-23 DIAGNOSIS — R109 Unspecified abdominal pain: Secondary | ICD-10-CM

## 2022-12-23 DIAGNOSIS — D6959 Other secondary thrombocytopenia: Secondary | ICD-10-CM | POA: Diagnosis not present

## 2022-12-23 DIAGNOSIS — J9611 Chronic respiratory failure with hypoxia: Secondary | ICD-10-CM

## 2022-12-23 DIAGNOSIS — F419 Anxiety disorder, unspecified: Secondary | ICD-10-CM | POA: Diagnosis present

## 2022-12-23 DIAGNOSIS — E785 Hyperlipidemia, unspecified: Secondary | ICD-10-CM | POA: Diagnosis present

## 2022-12-23 DIAGNOSIS — F32A Depression, unspecified: Secondary | ICD-10-CM | POA: Diagnosis present

## 2022-12-23 DIAGNOSIS — D649 Anemia, unspecified: Secondary | ICD-10-CM | POA: Diagnosis not present

## 2022-12-23 DIAGNOSIS — Z7982 Long term (current) use of aspirin: Secondary | ICD-10-CM | POA: Diagnosis not present

## 2022-12-23 DIAGNOSIS — E861 Hypovolemia: Secondary | ICD-10-CM | POA: Diagnosis present

## 2022-12-23 DIAGNOSIS — J439 Emphysema, unspecified: Secondary | ICD-10-CM | POA: Diagnosis present

## 2022-12-23 DIAGNOSIS — Z1152 Encounter for screening for COVID-19: Secondary | ICD-10-CM | POA: Diagnosis not present

## 2022-12-23 DIAGNOSIS — K5792 Diverticulitis of intestine, part unspecified, without perforation or abscess without bleeding: Principal | ICD-10-CM

## 2022-12-23 DIAGNOSIS — I10 Essential (primary) hypertension: Secondary | ICD-10-CM | POA: Diagnosis present

## 2022-12-23 DIAGNOSIS — Z79899 Other long term (current) drug therapy: Secondary | ICD-10-CM | POA: Diagnosis not present

## 2022-12-23 DIAGNOSIS — Z9981 Dependence on supplemental oxygen: Secondary | ICD-10-CM | POA: Diagnosis not present

## 2022-12-23 DIAGNOSIS — E876 Hypokalemia: Secondary | ICD-10-CM | POA: Diagnosis not present

## 2022-12-23 DIAGNOSIS — S0990XA Unspecified injury of head, initial encounter: Secondary | ICD-10-CM | POA: Diagnosis present

## 2022-12-23 DIAGNOSIS — F17210 Nicotine dependence, cigarettes, uncomplicated: Secondary | ICD-10-CM | POA: Diagnosis present

## 2022-12-23 DIAGNOSIS — R296 Repeated falls: Secondary | ICD-10-CM | POA: Diagnosis present

## 2022-12-23 DIAGNOSIS — R112 Nausea with vomiting, unspecified: Secondary | ICD-10-CM | POA: Insufficient documentation

## 2022-12-23 DIAGNOSIS — E871 Hypo-osmolality and hyponatremia: Secondary | ICD-10-CM | POA: Diagnosis not present

## 2022-12-23 LAB — BASIC METABOLIC PANEL
Anion gap: 13 (ref 5–15)
BUN: 10 mg/dL (ref 6–20)
CO2: 28 mmol/L (ref 22–32)
Calcium: 8.1 mg/dL — ABNORMAL LOW (ref 8.9–10.3)
Chloride: 95 mmol/L — ABNORMAL LOW (ref 98–111)
Creatinine, Ser: 0.94 mg/dL (ref 0.44–1.00)
GFR, Estimated: >60 mL/min (ref >60)
Glucose, Bld: 101 mg/dL — ABNORMAL HIGH (ref 70–99)
Potassium: 3.4 mmol/L — ABNORMAL LOW (ref 3.5–5.1)
Sodium: 136 mmol/L (ref 135–145)

## 2022-12-23 LAB — CBC WITH DIFFERENTIAL/PLATELET
Abs Immature Granulocytes: 0.01 10*3/uL (ref 0.00–0.07)
Basophils Absolute: 0 10*3/uL (ref 0.0–0.1)
Basophils Relative: 1 %
Eosinophils Absolute: 0.2 10*3/uL (ref 0.0–0.5)
Eosinophils Relative: 3 %
HCT: 38.4 % (ref 36.0–46.0)
Hemoglobin: 12.8 g/dL (ref 12.0–15.0)
Immature Granulocytes: 0 %
Lymphocytes Relative: 31 %
Lymphs Abs: 1.9 10*3/uL (ref 0.7–4.0)
MCH: 32.4 pg (ref 26.0–34.0)
MCHC: 33.3 g/dL (ref 30.0–36.0)
MCV: 97.2 fL (ref 80.0–100.0)
Monocytes Absolute: 0.7 10*3/uL (ref 0.1–1.0)
Monocytes Relative: 11 %
Neutro Abs: 3.5 10*3/uL (ref 1.7–7.7)
Neutrophils Relative %: 54 %
Platelets: 152 10*3/uL (ref 150–400)
RBC: 3.95 MIL/uL (ref 3.87–5.11)
RDW: 12.1 % (ref 11.5–15.5)
WBC: 6.3 10*3/uL (ref 4.0–10.5)
nRBC: 0 % (ref 0.0–0.2)

## 2022-12-23 LAB — MAGNESIUM: Magnesium: 1.1 mg/dL — ABNORMAL LOW (ref 1.7–2.4)

## 2022-12-23 LAB — SARS CORONAVIRUS 2 BY RT PCR: SARS Coronavirus 2 by RT PCR: NEGATIVE

## 2022-12-23 MED ORDER — ROPINIROLE HCL 1 MG PO TABS
1.0000 mg | ORAL_TABLET | Freq: Every day | ORAL | Status: DC
  Administered 2022-12-23 – 2022-12-26 (×4): 1 mg via ORAL
  Filled 2022-12-23 (×2): qty 2
  Filled 2022-12-23: qty 1
  Filled 2022-12-23: qty 2

## 2022-12-23 MED ORDER — SODIUM CHLORIDE 0.9 % IV SOLN: INTRAVENOUS | Status: DC

## 2022-12-23 MED ORDER — ALBUTEROL SULFATE (2.5 MG/3ML) 0.083% IN NEBU
2.5000 mg | INHALATION_SOLUTION | RESPIRATORY_TRACT | Status: DC | PRN
  Administered 2022-12-23: 2.5 mg via RESPIRATORY_TRACT
  Filled 2022-12-23: qty 3

## 2022-12-23 MED ORDER — ONDANSETRON HCL 4 MG/2ML IJ SOLN: 4.0000 mg | Freq: Once | INTRAMUSCULAR | Status: DC

## 2022-12-23 MED ORDER — SODIUM BICARBONATE 8.4 % IV SOLN
50.0000 meq | Freq: Once | INTRAVENOUS | Status: AC
  Administered 2022-12-23: 50 meq via INTRAVENOUS
  Filled 2022-12-23: qty 50

## 2022-12-23 MED ORDER — ACETAMINOPHEN 650 MG RE SUPP: 650.0000 mg | Freq: Four times a day (QID) | RECTAL | Status: DC | PRN

## 2022-12-23 MED ORDER — ACETAMINOPHEN 325 MG PO TABS
650.0000 mg | ORAL_TABLET | Freq: Four times a day (QID) | ORAL | Status: DC | PRN
  Administered 2022-12-26: 650 mg via ORAL
  Filled 2022-12-23: qty 2

## 2022-12-23 MED ORDER — POTASSIUM CHLORIDE CRYS ER 20 MEQ PO TBCR
40.0000 meq | EXTENDED_RELEASE_TABLET | Freq: Once | ORAL | Status: AC
  Administered 2022-12-23: 40 meq via ORAL
  Filled 2022-12-23: qty 2

## 2022-12-23 MED ORDER — SODIUM CHLORIDE 0.9 % IV SOLN
12.5000 mg | Freq: Once | INTRAVENOUS | Status: AC
  Administered 2022-12-23: 12.5 mg via INTRAVENOUS
  Filled 2022-12-23: qty 0.5

## 2022-12-23 MED ORDER — PANTOPRAZOLE SODIUM 40 MG IV SOLR
40.0000 mg | Freq: Once | INTRAVENOUS | Status: AC
  Administered 2022-12-23: 40 mg via INTRAVENOUS
  Filled 2022-12-23: qty 10

## 2022-12-23 MED ORDER — SODIUM CHLORIDE 0.9 % IV SOLN
2.0000 g | INTRAVENOUS | Status: DC
  Administered 2022-12-23 – 2022-12-25 (×3): 2 g via INTRAVENOUS
  Filled 2022-12-23 (×3): qty 20

## 2022-12-23 MED ORDER — MAGNESIUM SULFATE 4 GM/100ML IV SOLN
4.0000 g | Freq: Once | INTRAVENOUS | Status: AC
  Administered 2022-12-23: 4 g via INTRAVENOUS
  Filled 2022-12-23: qty 100

## 2022-12-23 MED ORDER — SODIUM CHLORIDE 0.9 % IV SOLN
1.0000 g | Freq: Once | INTRAVENOUS | Status: AC
  Administered 2022-12-23: 1 g via INTRAVENOUS
  Filled 2022-12-23: qty 10

## 2022-12-23 MED ORDER — MECLIZINE HCL 25 MG PO TABS
25.0000 mg | ORAL_TABLET | Freq: Three times a day (TID) | ORAL | Status: DC | PRN
  Administered 2022-12-23 – 2022-12-24 (×3): 25 mg via ORAL
  Filled 2022-12-23 (×5): qty 1

## 2022-12-23 MED ORDER — TRAZODONE HCL 50 MG PO TABS
50.0000 mg | ORAL_TABLET | Freq: Every day | ORAL | Status: DC
  Administered 2022-12-23 – 2022-12-26 (×4): 50 mg via ORAL
  Filled 2022-12-23 (×4): qty 1

## 2022-12-23 MED ORDER — METOCLOPRAMIDE HCL 5 MG/ML IJ SOLN
5.0000 mg | Freq: Four times a day (QID) | INTRAMUSCULAR | Status: DC | PRN
  Administered 2022-12-23 – 2022-12-25 (×4): 5 mg via INTRAVENOUS
  Filled 2022-12-23 (×4): qty 2

## 2022-12-23 MED ORDER — ONDANSETRON HCL 4 MG/2ML IJ SOLN
4.0000 mg | Freq: Four times a day (QID) | INTRAMUSCULAR | Status: DC | PRN
  Administered 2022-12-24: 4 mg via INTRAVENOUS

## 2022-12-23 MED ORDER — HEPARIN SODIUM (PORCINE) 5000 UNIT/ML IJ SOLN
5000.0000 [IU] | Freq: Three times a day (TID) | INTRAMUSCULAR | Status: DC
  Administered 2022-12-23 – 2022-12-27 (×14): 5000 [IU] via SUBCUTANEOUS
  Filled 2022-12-23 (×14): qty 1

## 2022-12-23 MED ORDER — METRONIDAZOLE 500 MG/100ML IV SOLN
500.0000 mg | Freq: Two times a day (BID) | INTRAVENOUS | Status: DC
  Administered 2022-12-23 – 2022-12-26 (×7): 500 mg via INTRAVENOUS
  Filled 2022-12-23 (×7): qty 100

## 2022-12-23 MED ORDER — DROPERIDOL 2.5 MG/ML IJ SOLN
1.2500 mg | Freq: Once | INTRAMUSCULAR | Status: AC
  Administered 2022-12-23: 1.25 mg via INTRAVENOUS
  Filled 2022-12-23: qty 2

## 2022-12-23 MED ORDER — SODIUM CHLORIDE 0.9 % IV SOLN
12.5000 mg | Freq: Four times a day (QID) | INTRAVENOUS | Status: DC
  Administered 2022-12-23 – 2022-12-25 (×9): 12.5 mg via INTRAVENOUS
  Filled 2022-12-23: qty 0.5
  Filled 2022-12-23: qty 12.5
  Filled 2022-12-23 (×2): qty 0.5
  Filled 2022-12-23 (×2): qty 12.5
  Filled 2022-12-23: qty 0.5
  Filled 2022-12-23 (×5): qty 12.5

## 2022-12-23 MED ORDER — HYDROMORPHONE HCL 1 MG/ML IJ SOLN
0.5000 mg | INTRAMUSCULAR | Status: DC | PRN
  Administered 2022-12-23 – 2022-12-24 (×6): 0.5 mg via INTRAVENOUS
  Filled 2022-12-23 (×6): qty 0.5

## 2022-12-23 MED ORDER — ALPRAZOLAM 0.25 MG PO TABS
1.0000 mg | ORAL_TABLET | Freq: Three times a day (TID) | ORAL | Status: DC | PRN
  Administered 2022-12-24: 1 mg via ORAL
  Filled 2022-12-23: qty 4

## 2022-12-23 MED ORDER — AMLODIPINE BESYLATE 5 MG PO TABS
5.0000 mg | ORAL_TABLET | Freq: Every day | ORAL | Status: DC
  Administered 2022-12-23 – 2022-12-27 (×5): 5 mg via ORAL
  Filled 2022-12-23 (×5): qty 1

## 2022-12-23 MED ORDER — LOPERAMIDE HCL 2 MG PO CAPS: 2.0000 mg | ORAL_CAPSULE | ORAL | Status: DC | PRN

## 2022-12-23 NOTE — Progress Notes (Signed)
Patient admitted early this morning for nausea, vomiting, abdominal pain and diarrhea.  Patient seen and examined at bedside and plan of care discussed with her.  I have reviewed patient's medical records including this morning's H&P, current vitals, labs, medications myself.  Stool for C. difficile negative.  GI panel PCR pending.  Currently on IV Rocephin and Flagyl.  Continue IV fluids and antibiotics.  Repeat a.m. labs.

## 2022-12-23 NOTE — Care Plan (Signed)
SARS Coronavirus test negative. Airborne/ contact precautions discontinued.

## 2022-12-23 NOTE — Plan of Care (Signed)

## 2022-12-23 NOTE — H&P (Addendum)
PCP:   Park Meo, FNP   Chief Complaint:  Gastroenteritis  HPI: This is a 57 year old female with history of severe COPD, chronic respiratory failure 5 L baseline, anxiety and depression, HTN, HLD.  Patient also has history of C. difficile, diverticulitis.  Two days ago approximately 4 AM she started feeling nauseous, went on to develop intractable nausea and vomiting, significant abdominal pain, and diarrhea.  Per patient her hernias kept popping out with her retching and vomiting episode and she had a bad headache.  Patient is greatest in the lower quadrants.  She has had this before whenever she has had C. difficile or with her diverticulitis.  She denies fever, chills, burning urination.  She came to the ER  In the ER patient vitals are stable.  Lactic acid, WBC is normal.  Potassium 5.3.  CT abdomen and pelvis shows diverticulosis with question of mild diverticulitis.  Patient has been given an amp of bicarb (blood, aborted due to diarrhea), IV fluids and IV Rocephin. For her nausea and vomiting she has received Phenergan x 2, IV Zofran, IV Reglan, IV Ativan.  The patient felt better however she failed a feeding trial.  Review of Systems:  Per HPI  Past Medical History: Past Medical History:  Diagnosis Date   Allergy    seasonal allergies   Anxiety    on meds   Arthritis    hands   Asthma    childhood-current   CAP (community acquired pneumonia) 04/07/2019   Carpal tunnel syndrome    had bilateral sx to tx   Cataract    bilateral--no sx yet   COPD (chronic obstructive pulmonary disease) (HCC)    Depression    on meds   Diverticulitis    Emphysema of lung (HCC)    uses MDI   Essential hypertension, benign 07/14/2019   on meds   Hyperlipidemia    on meds   Oxygen deficiency    on 5 L cont [facility]   Oxygen dependent    on 5 L cont [facility]   Past Surgical History:  Procedure Laterality Date   ABDOMINAL HYSTERECTOMY  2009   APPENDECTOMY     ARTHROSCOPIC REPAIR ACL Left  2011/2014   x 2   BIOPSY  01/21/2020   Procedure: BIOPSY;  Surgeon: Napoleon Form, MD;  Location: [facility];  Service: Endoscopy;;  EGD and COLON   BUNIONECTOMY  2006   both   CARPAL TUNNEL RELEASE Bilateral 10/28/2013   Procedure: BILATERAL CARPAL TUNNEL RELEASE;  Surgeon: Nicki Reaper, MD;  Location: [facility];  Service: Orthopedics;  Laterality: Bilateral;   CHOLECYSTECTOMY  1994   CHONDROPLASTY Right 08/31/2020   Procedure: CHONDROPLASTY;  Surgeon: Frederico Hamman, MD;  Location: [facility];  Service: Orthopedics;  Laterality: Right;   COLONOSCOPY WITH PROPOFOL N/A 01/21/2020   Procedure: COLONOSCOPY WITH PROPOFOL;  Surgeon: Napoleon Form, MD;  Location: [facility];  Service: Endoscopy;  Laterality: N/A;   DILATION AND CURETTAGE OF UTERUS     ESOPHAGOGASTRODUODENOSCOPY (EGD) WITH PROPOFOL N/A 01/21/2020   Procedure: ESOPHAGOGASTRODUODENOSCOPY (EGD) WITH PROPOFOL;  Surgeon: Napoleon Form, MD;  Location: [facility];  Service: Endoscopy;  Laterality: N/A;   KNEE ARTHROSCOPY WITH ANTERIOR CRUCIATE LIGAMENT (ACL) REPAIR Right 08/31/2020   Procedure: RIGHT KNEE ARTHROSCOPY WITH ANTERIOR CRUCIATE LIGAMENT (ACL) REPAIR MEDIAL AND LATERAL  MENISECTOMY;  Surgeon: Frederico Hamman, MD;  Location:  SURGERY CENTER;  Service: Orthopedics;  Laterality: Right;  FEMORAL NERVE BLOCK  POLYPECTOMY  01/21/2020   Procedure: POLYPECTOMY;  Surgeon: Napoleon Form, MD;  Location: [facility];  Service: Endoscopy;;   TUBAL LIGATION  2004    Medications: Prior to Admission medications   Medication Sig Start Date End Date Taking? Authorizing Provider  acetaminophen (TYLENOL) 500 MG tablet Take 1,000 mg by mouth every 6 (six) hours as needed for moderate pain.    [provider]  albuterol (VENTOLIN HFA) 108 (90 Base) MCG/ACT inhaler INHALE 2 PUFFS INTO THE LUNGS TWICE A DAY Patient taking differently: Inhale 2 puffs into the lungs every 4  (four) hours. 11/20/21   [name], NP  ALPRAZolam Prudy Feeler) 1 MG tablet TAKE 1 TABLET BY MOUTH THREE TIMES A DAY AS NEEDED FOR ANXIETY Strength: 1 mg 09/26/22   Park Meo, FNP  amLODipine (NORVASC) 5 MG tablet Take 1 tablet (5 mg total) by mouth daily. 07/07/21   [name], NP  ASPIRIN LOW DOSE 81 MG EC tablet TAKE 1 TABLET BY MOUTH 2 TIMES DAILY. Patient taking differently: Take 81 mg by mouth in the morning and at bedtime. 03/21/21   [name], NP  azelastine (ASTELIN) 0.1 % nasal spray Place 1 spray into both nostrils 2 (two) times daily. Use in each nostril as directed Patient taking differently: Place 1 spray into both nostrils daily. 07/07/21   [name], NP  EPINEPHrine (EPIPEN 2-PAK) 0.3 mg/0.3 mL IJ SOAJ injection Inject 0.3 mg into the muscle as needed for anaphylaxis. 12/27/20   [name], NP  gabapentin (NEURONTIN) 300 MG capsule Take 1 capsule (300 mg total) by mouth 2 (two) times daily. Patient taking differently: Take 300 mg by mouth 3 (three) times daily. 07/07/21   [name], NP  hydrOXYzine (ATARAX) 25 MG tablet TAKE 0.5-1 TABLETS (12.5-25 MG TOTAL) BY MOUTH 3 (THREE) TIMES DAILY AS NEEDED FOR ANXIETY. 09/18/21   [name], NP  ipratropium-albuterol (DUONEB) 0.5-2.5 (3) MG/3ML SOLN Use 1 vial by nebulization in the morning and at bedtime. 10/10/21   [name], NP  Magnesium Citrate 100 MG TABS Take 1 tablet by mouth at bedtime. Patient taking differently: Take 100 mg by mouth at bedtime. 10/10/21   [name], NP  meclizine (ANTIVERT) 25 MG tablet Take 1 tablet (25 mg total) by mouth 3 (three) times daily as needed for dizziness. 12/27/20   [name], NP  omeprazole (PRILOSEC) 20 MG capsule Take 1 capsule (20 mg total) by mouth daily. 10/29/22   Park Meo, FNP  ondansetron (ZOFRAN) 8 MG tablet Take 1 tablet (8 mg total) by mouth every 8 (eight) hours as needed for nausea or vomiting. 12/06/21   [name], NP  potassium  chloride SA (KLOR-CON M) 20 MEQ tablet Take 1 tablet (20 mEq total) by mouth daily. 10/10/21   [name], NP  prazosin (MINIPRESS) 1 MG capsule TAKE 1 TO 2 CAPSULES (1 TO 2 MG TOTAL) BY MOUTH AT BEDTIME. Patient taking differently: Take 1-2 mg by mouth at bedtime. 12/27/20   [name], NP  promethazine (PHENERGAN) 25 MG tablet Take 1 tablet (25 mg total) by mouth every 8 (eight) hours as needed. Patient taking differently: Take 25 mg by mouth every 8 (eight) hours as needed for nausea or vomiting. 12/06/21   [name], NP  rizatriptan (MAXALT) 10 MG tablet TAKE 1 TABLET BY MOUTH AS NEEDED FOR MIGRAINE. MAY REPEAT IN 2 HOURS IF NEEDED STRENGTH: 10 MG 12/06/22   Park Meo, FNP  rOPINIRole (REQUIP) 2 MG tablet  Take 0.5-1 tablets (1-2 mg total) by mouth at bedtime. 10/10/21   [name], NP  traZODone (DESYREL) 50 MG tablet Take 1 tablet (50 mg total) by mouth at bedtime. 07/07/21   [name], NP  Varenicline Tartrate, Starter, (CHANTIX STARTING MONTH PAK) 0.5 MG X 11 & 1 MG X 42 TBPK Per pack instructions 07/25/22   Glenford Bayley, NP    Allergies:   Allergies  Allergen Reactions   Jasmine Oil Shortness Of Breath and Swelling   Levaquin [Levofloxacin] Shortness Of Breath, Itching, Swelling and Rash   Penicillins Anaphylaxis and Hives    Tolerated ceftriaxone   Aleve [Naproxen Sodium] Itching   Ceftin [Cefuroxime Axetil] Other (See Comments)    unsure   Chlorhexidine    Depo-Provera [Medroxyprogesterone Acetate] Other (See Comments)    Severe acne   Doxycycline Other (See Comments)    Raises blood pressure    Oxycodone Nausea And Vomiting   Tramadol Other (See Comments)    Kept her up all night    Social History:  reports that she has been smoking cigarettes. She has a 34 pack-year smoking history. She has never used smokeless tobacco. She reports current alcohol use. She reports that she does not use drugs.  Family History: Family History  Problem  Relation Age of Onset   Brain cancer Mother 76   Lung cancer Mother 22   Breast cancer Sister 9   Ovarian cancer Sister 105   Colon cancer Neg Hx    Colon polyps Neg Hx    Esophageal cancer Neg Hx    Rectal cancer Neg Hx    Stomach cancer Neg Hx     Physical Exam: Vitals:   12/22/22 2030 12/22/22 2100 12/23/22 0017 12/23/22 0432  BP: (!) 154/87 (!) 133/90 (!) 152/110 (!) 178/121  Pulse: (!) 105 (!) 107 100 (!) 108  Resp: 13 17 19  (!) 22  Temp:   98.4 F (36.9 C) 98.4 F (36.9 C)  TempSrc:   Oral Oral  SpO2: 95% 95% 96% 98%  Weight:      Height:        General: A and O x 3, well developed and nourished, ill-appearing female Eyes: PERRLA, pink conjunctiva, no scleral icterus ENT: Dry oral mucosa, neck supple, no thyromegaly Lungs: CTA /L, no wheeze, no crackles, no use of accessory muscles Cardiovascular: Tachycardia, RRR, no regurgitation, no gallops, no murmurs. No carotid bruits, no JVD Abdomen: soft, positive BS, + TTP greatest in the B/L lower quadrants, and somewhat in the RUQ. Not an acute abdomen GU: not examined Neuro: CN II - XII grossly intact, sensation intact Musculoskeletal: strength 5/5 all extremities, no edema Skin: no rash, no subcutaneous crepitation, no decubitus Psych: appropriate patient   Labs on Admission:  Recent Labs    12/22/22 1903  NA 134*  K 5.3*  CL 99  CO2 19*  GLUCOSE 100*  BUN 17  CREATININE 1.05*  CALCIUM 8.3*   Recent Labs    12/22/22 1903  AST 64*  ALT 23  ALKPHOS 124  BILITOT 1.5*  PROT 6.6  ALBUMIN 3.9   Recent Labs    12/22/22 1903  LIPASE 30   Recent Labs    12/22/22 1903  WBC 9.7  NEUTROABS 7.2  HGB 14.7  HCT 43.2  MCV 95.8  PLT 216    Micro Results: Recent Results (from the past 240 hour(s))  C Difficile Quick Screen w PCR reflex     Status: None  Collection Time: 12/22/22  7:28 PM   Specimen: STOOL  Result Value Ref Range Status   C Diff antigen NEGATIVE NEGATIVE Final   C Diff toxin  NEGATIVE NEGATIVE Final   C Diff interpretation No C. difficile detected.  Final    Comment: Performed at [facility], [address] [place], [place] [address]     Radiological Exams on Admission: CT Head Wo Contrast  Result Date: 12/22/2022 CLINICAL DATA:  Trauma EXAM: CT HEAD WITHOUT CONTRAST TECHNIQUE: Contiguous axial images were obtained from the base of the skull through the vertex without intravenous contrast. RADIATION DOSE REDUCTION: This exam was performed according to the departmental dose-optimization program which includes automated exposure control, adjustment of the mA and/or kV according to patient size and/or use of iterative reconstruction technique. COMPARISON:  03/16/2019 FINDINGS: Brain: No evidence of acute infarction, hemorrhage, hydrocephalus, extra-axial collection or mass lesion/mass effect. Old left basal ganglia lacunar infarct versus prominent VR space (series 3/image 14). Vascular: No hyperdense vessel or unexpected calcification. Skull: Normal. Negative for fracture or focal lesion. Sinuses/Orbits: Mild mucosal thickening of the bilateral maxillary sinuses and right sphenoid sinus. Partial opacification of the left sphenoid sinus. Other: None. IMPRESSION: No acute intracranial abnormality. Electronically Signed   By: Charline Bills M.D.   On: 12/22/2022 20:29   CT ABDOMEN PELVIS W CONTRAST  Result Date: 12/22/2022 CLINICAL DATA:  Abdominal pain. EXAM: CT ABDOMEN AND PELVIS WITH CONTRAST TECHNIQUE: Multidetector CT imaging of the abdomen and pelvis was performed using the standard protocol following bolus administration of intravenous contrast. RADIATION DOSE REDUCTION: This exam was performed according to the departmental dose-optimization program which includes automated exposure control, adjustment of the mA and/or kV according to patient size and/or use of iterative reconstruction technique. CONTRAST:  75mL OMNIPAQUE IOHEXOL 350 MG/ML SOLN COMPARISON:  CT  abdomen pelvis dated 04/11/2022. FINDINGS: Lower chest: The visualized lung bases are clear. No intra-abdominal free air or free fluid. Hepatobiliary: Fatty liver. No biliary dilation. Cholecystectomy. No retained calcified stone noted in the central CBD. Pancreas: Unremarkable. No pancreatic ductal dilatation or surrounding inflammatory changes. Spleen: Normal in size without focal abnormality. Adrenals/Urinary Tract: The adrenal glands are unremarkable. There is no hydronephrosis on either side. There is symmetric enhancement and excretion of contrast by both kidneys. The visualized ureters and urinary bladder appear unremarkable. Stomach/Bowel: There is sigmoid diverticulosis with muscular hypertrophy. Minimal perisigmoid haziness, likely chronic. Mild acute diverticulitis is not excluded. Clinical correlation is recommended. No diverticular abscess or perforation. There is no bowel obstruction. The appendix is normal. Vascular/Lymphatic: Mild aortoiliac atherosclerotic disease. The IVC is unremarkable. No portal venous gas. There is no adenopathy. Reproductive: Hysterectomy.  No adnexal masses. Other: None Musculoskeletal: Osteopenia with degenerative changes of the spine. Avascular necrosis of the right femoral head. No cortical collapse. No acute osseous pathology. IMPRESSION: 1. Sigmoid diverticulosis with muscular hypertrophy. Mild acute diverticulitis is not excluded. 2. Fatty liver. 3.  Aortic Atherosclerosis (ICD10-I70.0). Electronically Signed   By: Elgie Collard M.D.   On: 12/22/2022 20:25    Assessment/Plan Present on Admission:  Gastroenteritis, recurrent  Possible acute diverticulitis /abdominal pain -C. difficile negative.  Imodium as needed -IV Rocephin and Flagyl ordered.  Patient allergic to Cipro and penicillins -Dilaudid 0.5 mg as needed -IV fluid hydration -For intractable nausea and vomiting IV Phenergan scheduled, Zofran and Reglan as needed. -Clear liquid diet, advance as  tolerated -Patient's home Xanax and meclizine.  Resumed -GI panel ordered   Hyperkalemia, mild -Treating with IV  fluids, bicarb -Repeat BMP ordered -Potassium on hold   Chronic respiratory failure with hypoxia (HCC)  COPD, severe (HCC) -Oxygen, nebulizers reordered   Essential hypertension -Hypotensive, may be secondary to patient's nausea vomiting. -Home meds resumed.  Medications initiated   ,  12/23/2022, 4:49 AM

## 2022-12-23 NOTE — ED Notes (Signed)
Patient given PO challenge of water and graham crackers. She advised she did not wish to eat, but I advised her doctor was wanting to see if she could keep it down. She advised she would try.

## 2022-12-23 NOTE — ED Provider Notes (Signed)
12:05 AM Assumed care from Dr. Anitra Lauth, please see their note for full history, physical and decision making until this point. In brief this is a 57 y.o. year old female who presented to the ED tonight with Abdominal Pain     Abdominal pain with vomiting and diarrhea. H/o cdiff and diverticulitis. Here labs overall not too bad but ct a/p shows possible diverticulitis. Afebrile. No leukocytosis. Does have ketones.  Has had zofran with EMS, Phenergan at home, Reglan and Ativan here. LR Bolus given on infusion now with tachycardia. Plan to try one more antiemetic and reassess for disposition.  Will add on ECG and protonix as well. Abx for possible diverticulitis in case she needs admitted.   Patient with persistent nausea and emesis not able to tolerate p.o.  Potassium treated.  Admitted to hospitalist.   Labs, studies and imaging reviewed by myself and considered in medical decision making if ordered. Imaging interpreted by radiology.  Labs Reviewed  COMPREHENSIVE METABOLIC PANEL - Abnormal; Notable for the following components:      Result Value   Sodium 134 (*)    Potassium 5.3 (*)    CO2 19 (*)    Glucose, Bld 100 (*)    Creatinine, Ser 1.05 (*)    Calcium 8.3 (*)    AST 64 (*)    Total Bilirubin 1.5 (*)    Anion gap 16 (*)    All other components within normal limits  URINALYSIS, W/ REFLEX TO CULTURE (INFECTION SUSPECTED) - Abnormal; Notable for the following components:   Ketones, ur 20 (*)    All other components within normal limits  C DIFFICILE QUICK SCREEN W PCR REFLEX    CBC WITH DIFFERENTIAL/PLATELET  LIPASE, BLOOD  I-STAT CG4 LACTIC ACID, ED  I-STAT CG4 LACTIC ACID, ED    CT Head Wo Contrast  Final Result    CT ABDOMEN PELVIS W CONTRAST  Final Result      No follow-ups on file.    Marily Memos, MD 12/25/22 [phone]

## 2022-12-23 NOTE — ED Notes (Signed)
ED TO INPATIENT HANDOFF REPORT  ED Nurse Name and Phone #: Darvin Neighbours [phone]  S Name/Age/Gender Vicki [name] 58 y.o. female Room/Bed: H012C/H012C  Code Status   Code Status: Prior  Home/SNF/Other Home Patient oriented to: self, place, time, and situation Is this baseline? Yes   Triage Complete: Triage complete  Chief Complaint Acute gastroenteritis [K52.9]  Triage Note Abdominal pain started yesterday. Nausea, vomiting and diarrhea started 4AM today. Hx of c-diff last year and diverticulitis. Fentanyl and 4mg  Zofran given by EMS en route.    Allergies Allergies  Allergen Reactions   Jasmine Oil Shortness Of Breath and Swelling   Levaquin [Levofloxacin] Shortness Of Breath, Itching, Swelling and Rash   Penicillins Anaphylaxis and Hives    Tolerated ceftriaxone   Aleve [Naproxen Sodium] Itching   Ceftin [Cefuroxime Axetil] Other (See Comments)    unsure   Chlorhexidine    Depo-Provera [Medroxyprogesterone Acetate] Other (See Comments)    Severe acne   Doxycycline Other (See Comments)    Raises blood pressure    Oxycodone Nausea And Vomiting   Tramadol Other (See Comments)    Kept her up all night    Level of Care/Admitting Diagnosis ED Disposition     ED Disposition  Admit   Condition  --   Comment  Hospital Area: [facility] [[id number]]  Level of Care: Telemetry Medical [104]  May place patient in observation at [facility] or [facility] if equivalent level of care is available:: Yes  Covid Evaluation: Symptomatic Person Under Investigation (PUI) or recent exposure (last 10 days) *Testing Required*  Diagnosis: Acute gastroenteritis [401027]  Admitting Physician: Gery Pray [[id number]]  Attending Physician: Gery Pray [[id number]]          B Medical/Surgery History Past Medical History:  Diagnosis Date   Allergy    seasonal allergies   Anxiety    on meds   Arthritis    hands   Asthma    childhood-current   CAP  (community acquired pneumonia) 04/07/2019   Carpal tunnel syndrome    had bilateral sx to tx   Cataract    bilateral--no sx yet   COPD (chronic obstructive pulmonary disease) (HCC)    Depression    on meds   Diverticulitis    Emphysema of lung (HCC)    uses MDI   Essential hypertension, benign 07/14/2019   on meds   Hyperlipidemia    on meds   Oxygen deficiency    on 5 L cont [place]   Oxygen dependent    on 5 L cont [place]   Past Surgical History:  Procedure Laterality Date   ABDOMINAL HYSTERECTOMY  2009   APPENDECTOMY     ARTHROSCOPIC REPAIR ACL Left 2011/2014   x 2   BIOPSY  01/21/2020   Procedure: BIOPSY;  Surgeon: Napoleon Form, MD;  Location: [facility];  Service: Endoscopy;;  EGD and COLON   BUNIONECTOMY  2006   both   CARPAL TUNNEL RELEASE Bilateral 10/28/2013   Procedure: BILATERAL CARPAL TUNNEL RELEASE;  Surgeon: Nicki Reaper, MD;  Location: [facility];  Service: Orthopedics;  Laterality: Bilateral;   CHOLECYSTECTOMY  1994   CHONDROPLASTY Right 08/31/2020   Procedure: CHONDROPLASTY;  Surgeon: Frederico Hamman, MD;  Location: [facility];  Service: Orthopedics;  Laterality: Right;   COLONOSCOPY WITH PROPOFOL N/A 01/21/2020   Procedure: COLONOSCOPY WITH PROPOFOL;  Surgeon: Napoleon Form, MD;  Location: [facility];  Service: Endoscopy;  Laterality: N/A;   DILATION AND CURETTAGE OF UTERUS     ESOPHAGOGASTRODUODENOSCOPY (EGD) WITH PROPOFOL N/A 01/21/2020   Procedure: ESOPHAGOGASTRODUODENOSCOPY (EGD) WITH PROPOFOL;  Surgeon: Napoleon Form, MD;  Location: [facility];  Service: Endoscopy;  Laterality: N/A;   KNEE ARTHROSCOPY WITH ANTERIOR CRUCIATE LIGAMENT (ACL) REPAIR Right 08/31/2020   Procedure: RIGHT KNEE ARTHROSCOPY WITH ANTERIOR CRUCIATE LIGAMENT (ACL) REPAIR MEDIAL AND LATERAL  MENISECTOMY;  Surgeon: Frederico Hamman, MD;  Location: [facility];  Service: Orthopedics;  Laterality: Right;  FEMORAL NERVE BLOCK    POLYPECTOMY  01/21/2020   Procedure: POLYPECTOMY;  Surgeon: Napoleon Form, MD;  Location: [facility];  Service: Endoscopy;;   TUBAL LIGATION  2004     A IV Location/Drains/Wounds Patient Lines/Drains/Airways Status     Active Line/Drains/Airways     Name Placement date Placement time Site Days   Peripheral IV 12/22/22 18 G Anterior;Left;Proximal Antecubital 12/22/22  1833  Antecubital  1            Intake/Output Last 24 hours  Intake/Output Summary (Last 24 hours) at 12/23/2022 0519 Last data filed at 12/23/2022 0132 Gross per 24 hour  Intake 1150 ml  Output --  Net 1150 ml    Labs/Imaging Results for orders placed or performed during the hospital encounter of 12/22/22 (from the past 48 hour(s))  CBC with Differential/Platelet     Status: None   Collection Time: 12/22/22  7:03 PM  Result Value Ref Range   WBC 9.7 4.0 - 10.5 K/uL   RBC 4.51 3.87 - 5.11 MIL/uL   Hemoglobin 14.7 12.0 - 15.0 g/dL   HCT 95.2 84.1 - 32.4 %   MCV 95.8 80.0 - 100.0 fL   MCH 32.6 26.0 - 34.0 pg   MCHC 34.0 30.0 - 36.0 g/dL   RDW 40.1 02.7 - 25.3 %   Platelets 216 150 - 400 K/uL   nRBC 0.0 0.0 - 0.2 %   Neutrophils Relative % 75 %   Neutro Abs 7.2 1.7 - 7.7 K/uL   Lymphocytes Relative 17 %   Lymphs Abs 1.7 0.7 - 4.0 K/uL   Monocytes Relative 7 %   Monocytes Absolute 0.7 0.1 - 1.0 K/uL   Eosinophils Relative 0 %   Eosinophils Absolute 0.0 0.0 - 0.5 K/uL   Basophils Relative 1 %   Basophils Absolute 0.1 0.0 - 0.1 K/uL   Immature Granulocytes 0 %   Abs Immature Granulocytes 0.03 0.00 - 0.07 K/uL    Comment: Performed at [facility], [address], [place], [place] [address]  Comprehensive metabolic panel     Status: Abnormal   Collection Time: 12/22/22  7:03 PM  Result Value Ref Range   Sodium 134 (L) 135 - 145 mmol/L   Potassium 5.3 (H) 3.5 - 5.1 mmol/L   Chloride 99 98 - 111 mmol/L   CO2 19 (L) 22 - 32 mmol/L   Glucose, Bld 100 (H) 70 - 99 mg/dL    Comment: Glucose  reference range applies only to samples taken after fasting for at least 8 hours.   BUN 17 6 - 20 mg/dL   Creatinine, Ser 3.47 (H) 0.44 - 1.00 mg/dL   Calcium 8.3 (L) 8.9 - 10.3 mg/dL   Total Protein 6.6 6.5 - 8.1 g/dL   Albumin 3.9 3.5 - 5.0 g/dL   AST 64 (H) 15 - 41 U/L   ALT 23 0 - 44 U/L   Alkaline Phosphatase 124 38 - 126 U/L  Total Bilirubin 1.5 (H) 0.3 - 1.2 mg/dL   GFR, Estimated >16 >10 mL/min    Comment: (NOTE) Calculated using the CKD-EPI Creatinine Equation (2021)    Anion gap 16 (H) 5 - 15    Comment: Performed at [facility], [address] [place], [place] [address]  Lipase, blood     Status: None   Collection Time: 12/22/22  7:03 PM  Result Value Ref Range   Lipase 30 11 - 51 U/L    Comment: Performed at [facility], [address] [place], [place] [address]  I-Stat CG4 Lactic Acid     Status: None   Collection Time: 12/22/22  7:07 PM  Result Value Ref Range   Lactic Acid, Venous 1.3 0.5 - 1.9 mmol/L  C Difficile Quick Screen w PCR reflex     Status: None   Collection Time: 12/22/22  7:28 PM   Specimen: STOOL  Result Value Ref Range   C Diff antigen NEGATIVE NEGATIVE   C Diff toxin NEGATIVE NEGATIVE   C Diff interpretation No C. difficile detected.     Comment: Performed at [facility], [address], [place], [place] [address]  Urinalysis, w/ Reflex to Culture (Infection Suspected) -Urine, Clean Catch     Status: Abnormal   Collection Time: 12/22/22  7:55 PM  Result Value Ref Range   Specimen Source URINE, CLEAN CATCH    Color, Urine YELLOW YELLOW   APPearance CLEAR CLEAR   Specific Gravity, Urine 1.016 1.005 - 1.030   pH 5.0 5.0 - 8.0   Glucose, UA NEGATIVE NEGATIVE mg/dL   Hgb urine dipstick NEGATIVE NEGATIVE   Bilirubin Urine NEGATIVE NEGATIVE   Ketones, ur 20 (A) NEGATIVE mg/dL   Protein, ur NEGATIVE NEGATIVE mg/dL   Nitrite NEGATIVE NEGATIVE   Leukocytes,Ua NEGATIVE NEGATIVE   RBC / HPF 0-5 0 - 5 RBC/hpf   WBC, UA 0-5 0 -  5 WBC/hpf    Comment:        Reflex urine culture not performed if WBC <=10, OR if Squamous epithelial cells >5. If Squamous epithelial cells >5 suggest recollection.    Bacteria, UA NONE SEEN NONE SEEN   Squamous Epithelial / HPF 0-5 0 - 5 /HPF   Hyaline Casts, UA PRESENT     Comment: Performed at [facility], [address], [place], [place] [address]  I-Stat CG4 Lactic Acid     Status: None   Collection Time: 12/22/22  8:36 PM  Result Value Ref Range   Lactic Acid, Venous 1.1 0.5 - 1.9 mmol/L   CT Head Wo Contrast  Result Date: 12/22/2022 CLINICAL DATA:  Trauma EXAM: CT HEAD WITHOUT CONTRAST TECHNIQUE: Contiguous axial images were obtained from the base of the skull through the vertex without intravenous contrast. RADIATION DOSE REDUCTION: This exam was performed according to the departmental dose-optimization program which includes automated exposure control, adjustment of the mA and/or kV according to patient size and/or use of iterative reconstruction technique. COMPARISON:  03/16/2019 FINDINGS: Brain: No evidence of acute infarction, hemorrhage, hydrocephalus, extra-axial collection or mass lesion/mass effect. Old left basal ganglia lacunar infarct versus prominent VR space (series 3/image 14). Vascular: No hyperdense vessel or unexpected calcification. Skull: Normal. Negative for fracture or focal lesion. Sinuses/Orbits: Mild mucosal thickening of the bilateral maxillary sinuses and right sphenoid sinus. Partial opacification of the left sphenoid sinus. Other: None. IMPRESSION: No acute intracranial abnormality. Electronically Signed   By: Roselie Awkward.D.  On: 12/22/2022 20:29   CT ABDOMEN PELVIS W CONTRAST  Result Date: 12/22/2022 CLINICAL DATA:  Abdominal pain. EXAM: CT ABDOMEN AND PELVIS WITH CONTRAST TECHNIQUE: Multidetector CT imaging of the abdomen and pelvis was performed using the standard protocol following bolus administration of intravenous contrast. RADIATION  DOSE REDUCTION: This exam was performed according to the departmental dose-optimization program which includes automated exposure control, adjustment of the mA and/or kV according to patient size and/or use of iterative reconstruction technique. CONTRAST:  75mL OMNIPAQUE IOHEXOL 350 MG/ML SOLN COMPARISON:  CT abdomen pelvis dated 04/11/2022. FINDINGS: Lower chest: The visualized lung bases are clear. No intra-abdominal free air or free fluid. Hepatobiliary: Fatty liver. No biliary dilation. Cholecystectomy. No retained calcified stone noted in the central CBD. Pancreas: Unremarkable. No pancreatic ductal dilatation or surrounding inflammatory changes. Spleen: Normal in size without focal abnormality. Adrenals/Urinary Tract: The adrenal glands are unremarkable. There is no hydronephrosis on either side. There is symmetric enhancement and excretion of contrast by both kidneys. The visualized ureters and urinary bladder appear unremarkable. Stomach/Bowel: There is sigmoid diverticulosis with muscular hypertrophy. Minimal perisigmoid haziness, likely chronic. Mild acute diverticulitis is not excluded. Clinical correlation is recommended. No diverticular abscess or perforation. There is no bowel obstruction. The appendix is normal. Vascular/Lymphatic: Mild aortoiliac atherosclerotic disease. The IVC is unremarkable. No portal venous gas. There is no adenopathy. Reproductive: Hysterectomy.  No adnexal masses. Other: None Musculoskeletal: Osteopenia with degenerative changes of the spine. Avascular necrosis of the right femoral head. No cortical collapse. No acute osseous pathology. IMPRESSION: 1. Sigmoid diverticulosis with muscular hypertrophy. Mild acute diverticulitis is not excluded. 2. Fatty liver. 3.  Aortic Atherosclerosis (ICD10-I70.0). Electronically Signed   By: Elgie Collard M.D.   On: 12/22/2022 20:25    Pending Labs Unresulted Labs (From admission, onward)    None       Vitals/Pain Today's  Vitals   12/22/22 2100 12/22/22 2333 12/23/22 0017 12/23/22 0432  BP: (!) 133/90  (!) 152/110 (!) 178/121  Pulse: (!) 107  100 (!) 108  Resp: 17  19 (!) 22  Temp:   98.4 F (36.9 C) 98.4 F (36.9 C)  TempSrc:   Oral Oral  SpO2: 95%  96% 98%  Weight:      Height:      PainSc:  4       Isolation Precautions Enteric precautions (UV disinfection)  Medications Medications  lactated ringers infusion ( Intravenous New Bag/Given 12/22/22 2241)  metoCLOPramide (REGLAN) injection 10 mg (10 mg Intravenous Given 12/22/22 1859)  HYDROmorphone (DILAUDID) injection 1 mg (1 mg Intravenous Given 12/22/22 1859)  lactated ringers bolus 1,000 mL (0 mLs Intravenous Stopped 12/22/22 1940)  iohexol (OMNIPAQUE) 350 MG/ML injection 75 mL (75 mLs Intravenous Contrast Given 12/22/22 2016)  LORazepam (ATIVAN) injection 1 mg (1 mg Intravenous Given 12/22/22 2243)  HYDROmorphone (DILAUDID) injection 0.5 mg (0.5 mg Intravenous Given 12/22/22 2243)  cefTRIAXone (ROCEPHIN) 1 g in sodium chloride 0.9 % 100 mL IVPB (0 g Intravenous Stopped 12/23/22 0045)  pantoprazole (PROTONIX) injection 40 mg (40 mg Intravenous Given 12/23/22 0014)  promethazine (PHENERGAN) 12.5 mg in sodium chloride 0.9 % 50 mL IVPB (0 mg Intravenous Stopped 12/23/22 0132)  droperidol (INAPSINE) 2.5 MG/ML injection 1.25 mg (1.25 mg Intravenous Given 12/23/22 0426)  sodium bicarbonate injection 50 mEq (50 mEq Intravenous Given 12/23/22 0429)    Mobility non-ambulatory     Focused Assessments     R Recommendations: See Admitting Provider Note  Report given to:   Additional  Notes:

## 2022-12-24 DIAGNOSIS — K529 Noninfective gastroenteritis and colitis, unspecified: Secondary | ICD-10-CM | POA: Diagnosis not present

## 2022-12-24 MED ORDER — HYDROCODONE-ACETAMINOPHEN 5-325 MG PO TABS
1.0000 | ORAL_TABLET | ORAL | Status: DC | PRN
  Administered 2022-12-24 – 2022-12-27 (×10): 2 via ORAL
  Filled 2022-12-24 (×10): qty 2

## 2022-12-24 MED ORDER — MELATONIN 3 MG PO TABS
3.0000 mg | ORAL_TABLET | Freq: Every day | ORAL | Status: DC
  Administered 2022-12-24 – 2022-12-26 (×3): 3 mg via ORAL
  Filled 2022-12-24 (×3): qty 1

## 2022-12-24 MED ORDER — FENTANYL CITRATE PF 50 MCG/ML IJ SOSY
12.5000 ug | PREFILLED_SYRINGE | INTRAMUSCULAR | Status: DC | PRN
  Administered 2022-12-27: 25 ug via INTRAVENOUS
  Filled 2022-12-24: qty 1

## 2022-12-24 MED ORDER — MOMETASONE FURO-FORMOTEROL FUM 200-5 MCG/ACT IN AERO
2.0000 | INHALATION_SPRAY | Freq: Two times a day (BID) | RESPIRATORY_TRACT | Status: DC
  Administered 2022-12-24 – 2022-12-27 (×6): 2 via RESPIRATORY_TRACT
  Filled 2022-12-24: qty 8.8
  Filled 2022-12-24: qty 17.6
  Filled 2022-12-24: qty 8.8

## 2022-12-24 MED ORDER — PANTOPRAZOLE SODIUM 40 MG IV SOLR
40.0000 mg | Freq: Two times a day (BID) | INTRAVENOUS | Status: DC
  Administered 2022-12-24 – 2022-12-26 (×5): 40 mg via INTRAVENOUS
  Filled 2022-12-24 (×5): qty 10

## 2022-12-24 MED ORDER — ALUM & MAG HYDROXIDE-SIMETH 200-200-20 MG/5ML PO SUSP: 15.0000 mL | ORAL | Status: DC | PRN

## 2022-12-24 MED ORDER — IPRATROPIUM-ALBUTEROL 0.5-2.5 (3) MG/3ML IN SOLN
3.0000 mL | Freq: Four times a day (QID) | RESPIRATORY_TRACT | Status: DC
  Filled 2022-12-24: qty 3

## 2022-12-24 MED ORDER — IPRATROPIUM-ALBUTEROL 0.5-2.5 (3) MG/3ML IN SOLN
3.0000 mL | RESPIRATORY_TRACT | Status: DC | PRN
  Administered 2022-12-24: 3 mL via RESPIRATORY_TRACT
  Filled 2022-12-24 (×2): qty 3

## 2022-12-24 MED ORDER — ONDANSETRON HCL 4 MG/2ML IJ SOLN
4.0000 mg | Freq: Four times a day (QID) | INTRAMUSCULAR | Status: DC
  Administered 2022-12-24 – 2022-12-25 (×4): 4 mg via INTRAVENOUS
  Filled 2022-12-24 (×4): qty 2

## 2022-12-24 NOTE — Plan of Care (Signed)

## 2022-12-24 NOTE — Progress Notes (Signed)
PROGRESS NOTE    Vicki [name]  WUJ:[id number] DOB: [birth year] DOA: 12/22/2022 PCP: Park Meo, FNP   Brief Narrative:  57 year old female with history of severe COPD, chronic respiratory failure with hypoxia on 5 L oxygen via nasal cannula, anxiety and depression, hypertension, hyperlipidemia, C. difficile colitis, diverticulitis presented with worsening abdominal pain, nausea, vomiting and diarrhea.  On presentation, CT of abdomen and pelvis showed diverticulosis with question of mild diverticulitis.  She was started on IV antibiotics and fluids.  Assessment & Plan:   Possible acute diverticulitis -Stool for C. difficile negative.  Stool for GI PCR pending.  Currently on Rocephin and Flagyl.  Continue. -Still in significant pain and not able to tolerate diet.  Continue IV fluids at 50 cc an hour  Intractable nausea, vomiting and diarrhea with abdominal pain -Continues to have vomiting and diarrhea.  Switch Zofran to around-the-clock for 24 hours.  Continue Reglan as needed.  Continue IV analgesics: Switch to 20 since patient states that Dilaudid is not working. -Switch to clear liquid diet. -Protonix 40 mg IV every 12 hours.  Hyponatremia Mild.  Monitor  Hyperkalemia -Resolved  Hypokalemia -Improved  Hypomagnesemia -Improved  Chronic respiratory failure with hypoxia COPD -On 5 L oxygen via nasal cannula at home.  Currently on 4 L oxygen via nasal cannula. -Continue nebs  Essential hypertension Continue amlodipine.  Blood pressure intermittently elevated.  Monitor.   DVT prophylaxis: Heparin subcutaneous Code Status: Full Family Communication: None at bedside Disposition Plan: Status is: Inpatient Remains inpatient appropriate because: Of severity of illness  Consultants: None  Procedures: None  Antimicrobials: Rocephin and Zithromax from 12/22/2022 onwards   Subjective: Patient seen and examined at bedside.  Does not feel well.  Continues to have  vomiting and diarrhea and abdominal pain has not improved.  No fever, chest pain or worsening shortness of breath reported  Objective: Vitals:   12/23/22 1200 12/23/22 1435 12/23/22 2022 12/24/22 0722  BP:  (!) 144/110 (!) 147/99 (!) 133/92  Pulse: 85 93 95 83  Resp: 17 16 19    Temp:  98.3 F (36.8 C) 98.3 F (36.8 C) (!) 97.5 F (36.4 C)  TempSrc:  Oral Oral Oral  SpO2: 97% 94% 97% 97%  Weight:      Height:        Intake/Output Summary (Last 24 hours) at 12/24/2022 0949 Last data filed at 12/24/2022 0945 Gross per 24 hour  Intake 834.25 ml  Output 300 ml  Net 534.25 ml   Filed Weights   12/22/22 1831  Weight: 73.9 kg    Examination:  General exam: Appears calm and comfortable.  Looks chronically ill and deconditioned.  On 4 L oxygen via nasal cannula Respiratory system: Bilateral decreased breath sounds at bases with some scattered crackles Cardiovascular system: S1 & S2 heard, Rate controlled Gastrointestinal system: Abdomen is distended, soft and mildly tender.  Normal bowel sounds heard. Extremities: No cyanosis, clubbing, edema  Central nervous system: Alert and oriented. No focal neurological deficits. Moving extremities Skin: No rashes, lesions or ulcers Psychiatry: Looks anxious intermittently.  Not agitated.    Data Reviewed: I have personally reviewed following labs and imaging studies  CBC: Recent Labs  Lab 12/22/22 1903 12/23/22 0700  WBC 9.7 6.3  NEUTROABS 7.2 3.5  HGB 14.7 12.8  HCT 43.2 38.4  MCV 95.8 97.2  PLT 216 152   Basic Metabolic Panel: Recent Labs  Lab 12/22/22 1903 12/23/22 0700 12/24/22 0016  NA 134* 136 133*  K 5.3*  3.4* 3.9  CL 99 95* 92*  CO2 19* 28 27  GLUCOSE 100* 101* 106*  BUN 17 10 <5*  CREATININE 1.05* 0.94 0.76  CALCIUM 8.3* 8.1* 8.4*  MG  --  1.1* 1.9   GFR: Estimated Creatinine Clearance: 76.5 mL/min (by C-G formula based on SCr of 0.76 mg/dL). Liver Function Tests: Recent Labs  Lab 12/22/22 1903  12/24/22 0016  AST 64* 76*  ALT 23 23  ALKPHOS 124 119  BILITOT 1.5* 0.9  PROT 6.6 6.5  ALBUMIN 3.9 3.7   Recent Labs  Lab 12/22/22 1903  LIPASE 30   No results for input(s): "AMMONIA" in the last 168 hours. Coagulation Profile: No results for input(s): "INR", "PROTIME" in the last 168 hours. Cardiac Enzymes: No results for input(s): "CKTOTAL", "CKMB", "CKMBINDEX", "TROPONINI" in the last 168 hours. BNP (last 3 results) No results for input(s): "PROBNP" in the last 8760 hours. HbA1C: No results for input(s): "HGBA1C" in the last 72 hours. CBG: No results for input(s): "GLUCAP" in the last 168 hours. Lipid Profile: No results for input(s): "CHOL", "HDL", "LDLCALC", "TRIG", "CHOLHDL", "LDLDIRECT" in the last 72 hours. Thyroid Function Tests: No results for input(s): "TSH", "T4TOTAL", "FREET4", "T3FREE", "THYROIDAB" in the last 72 hours. Anemia Panel: No results for input(s): "VITAMINB12", "FOLATE", "FERRITIN", "TIBC", "IRON", "RETICCTPCT" in the last 72 hours. Sepsis Labs: Recent Labs  Lab 12/22/22 1907 12/22/22 2036  LATICACIDVEN 1.3 1.1    Recent Results (from the past 240 hour(s))  C Difficile Quick Screen w PCR reflex     Status: None   Collection Time: 12/22/22  7:28 PM   Specimen: STOOL  Result Value Ref Range Status   C Diff antigen NEGATIVE NEGATIVE Final   C Diff toxin NEGATIVE NEGATIVE Final   C Diff interpretation No C. difficile detected.  Final    Comment: Performed at [facility], [address] [place], [place] [address]  SARS Coronavirus 2 by RT PCR (hospital order, performed in [facility] hospital lab) *cepheid single result test* Anterior Nasal Swab     Status: None   Collection Time: 12/23/22  2:47 PM   Specimen: Anterior Nasal Swab  Result Value Ref Range Status   SARS Coronavirus 2 by RT PCR NEGATIVE NEGATIVE Final    Comment: Performed at [facility], [address], [place], [place] [address]         Radiology  Studies: CT Head Wo Contrast  Result Date: 12/22/2022 CLINICAL DATA:  Trauma EXAM: CT HEAD WITHOUT CONTRAST TECHNIQUE: Contiguous axial images were obtained from the base of the skull through the vertex without intravenous contrast. RADIATION DOSE REDUCTION: This exam was performed according to the departmental dose-optimization program which includes automated exposure control, adjustment of the mA and/or kV according to patient size and/or use of iterative reconstruction technique. COMPARISON:  03/16/2019 FINDINGS: Brain: No evidence of acute infarction, hemorrhage, hydrocephalus, extra-axial collection or mass lesion/mass effect. Old left basal ganglia lacunar infarct versus prominent VR space (series 3/image 14). Vascular: No hyperdense vessel or unexpected calcification. Skull: Normal. Negative for fracture or focal lesion. Sinuses/Orbits: Mild mucosal thickening of the bilateral maxillary sinuses and right sphenoid sinus. Partial opacification of the left sphenoid sinus. Other: None. IMPRESSION: No acute intracranial abnormality. Electronically Signed   By: Charline Bills M.D.   On: 12/22/2022 20:29   CT ABDOMEN PELVIS W CONTRAST  Result Date: 12/22/2022 CLINICAL DATA:  Abdominal pain. EXAM: CT ABDOMEN AND PELVIS WITH CONTRAST TECHNIQUE: Multidetector CT imaging  of the abdomen and pelvis was performed using the standard protocol following bolus administration of intravenous contrast. RADIATION DOSE REDUCTION: This exam was performed according to the departmental dose-optimization program which includes automated exposure control, adjustment of the mA and/or kV according to patient size and/or use of iterative reconstruction technique. CONTRAST:  75mL OMNIPAQUE IOHEXOL 350 MG/ML SOLN COMPARISON:  CT abdomen pelvis dated 04/11/2022. FINDINGS: Lower chest: The visualized lung bases are clear. No intra-abdominal free air or free fluid. Hepatobiliary: Fatty liver. No biliary dilation. Cholecystectomy. No  retained calcified stone noted in the central CBD. Pancreas: Unremarkable. No pancreatic ductal dilatation or surrounding inflammatory changes. Spleen: Normal in size without focal abnormality. Adrenals/Urinary Tract: The adrenal glands are unremarkable. There is no hydronephrosis on either side. There is symmetric enhancement and excretion of contrast by both kidneys. The visualized ureters and urinary bladder appear unremarkable. Stomach/Bowel: There is sigmoid diverticulosis with muscular hypertrophy. Minimal perisigmoid haziness, likely chronic. Mild acute diverticulitis is not excluded. Clinical correlation is recommended. No diverticular abscess or perforation. There is no bowel obstruction. The appendix is normal. Vascular/Lymphatic: Mild aortoiliac atherosclerotic disease. The IVC is unremarkable. No portal venous gas. There is no adenopathy. Reproductive: Hysterectomy.  No adnexal masses. Other: None Musculoskeletal: Osteopenia with degenerative changes of the spine. Avascular necrosis of the right femoral head. No cortical collapse. No acute osseous pathology. IMPRESSION: 1. Sigmoid diverticulosis with muscular hypertrophy. Mild acute diverticulitis is not excluded. 2. Fatty liver. 3.  Aortic Atherosclerosis (ICD10-I70.0). Electronically Signed   By: Elgie Collard M.D.   On: 12/22/2022 20:25        Scheduled Meds:  amLODipine  5 mg Oral Daily   heparin  5,000 Units Subcutaneous Q8H   rOPINIRole  1 mg Oral QHS   traZODone  50 mg Oral QHS   Continuous Infusions:  sodium chloride 75 mL/hr at 12/23/22 0949   cefTRIAXone (ROCEPHIN)  IV 2 g (12/23/22 1833)   metronidazole 500 mg (12/24/22 0939)   promethazine (PHENERGAN) injection (IM or IVPB) 12.5 mg (12/24/22 4098)          Glade Lloyd, MD [facility] 12/24/2022, 9:49 AM

## 2022-12-24 NOTE — Progress Notes (Signed)
Pt has been experiencing difficulty staying asleep d/t  n&v. IV Zofran was administered, pt vomited approximately 100 ml of yellow-colored fluid throughout the night.

## 2022-12-24 NOTE — Plan of Care (Signed)

## 2022-12-25 DIAGNOSIS — K529 Noninfective gastroenteritis and colitis, unspecified: Secondary | ICD-10-CM | POA: Diagnosis not present

## 2022-12-25 DIAGNOSIS — K5792 Diverticulitis of intestine, part unspecified, without perforation or abscess without bleeding: Secondary | ICD-10-CM | POA: Diagnosis not present

## 2022-12-25 MED ORDER — POTASSIUM CHLORIDE CRYS ER 20 MEQ PO TBCR
40.0000 meq | EXTENDED_RELEASE_TABLET | ORAL | Status: AC
  Administered 2022-12-25 (×2): 40 meq via ORAL
  Filled 2022-12-25 (×2): qty 2

## 2022-12-25 MED ORDER — ONDANSETRON HCL 4 MG/2ML IJ SOLN
4.0000 mg | Freq: Four times a day (QID) | INTRAMUSCULAR | Status: DC | PRN
  Administered 2022-12-25 – 2022-12-27 (×6): 4 mg via INTRAVENOUS
  Filled 2022-12-25 (×6): qty 2

## 2022-12-25 MED ORDER — METOCLOPRAMIDE HCL 5 MG/ML IJ SOLN
10.0000 mg | Freq: Four times a day (QID) | INTRAMUSCULAR | Status: DC | PRN
  Administered 2022-12-25 – 2022-12-27 (×3): 10 mg via INTRAVENOUS
  Filled 2022-12-25 (×3): qty 2

## 2022-12-25 MED ORDER — MECLIZINE HCL 25 MG PO TABS
25.0000 mg | ORAL_TABLET | Freq: Three times a day (TID) | ORAL | Status: DC | PRN
  Filled 2022-12-25: qty 1

## 2022-12-25 MED ORDER — MAGNESIUM SULFATE 2 GM/50ML IV SOLN
2.0000 g | Freq: Once | INTRAVENOUS | Status: AC
  Administered 2022-12-25: 2 g via INTRAVENOUS
  Filled 2022-12-25: qty 50

## 2022-12-25 NOTE — Progress Notes (Signed)
PROGRESS NOTE    Vicki [name]  VWU:[id number] DOB: April 08, 1966 DOA: 12/22/2022 PCP: Park Meo, FNP   Brief Narrative:  57 year old female with history of severe COPD, chronic respiratory failure with hypoxia on 5 L oxygen via nasal cannula, anxiety and depression, hypertension, hyperlipidemia, C. difficile colitis, diverticulitis presented with worsening abdominal pain, nausea, vomiting and diarrhea.  On presentation, CT of abdomen and pelvis showed diverticulosis with question of mild diverticulitis.  She was started on IV antibiotics and fluids.  Assessment & Plan:   Possible acute diverticulitis -Stool for C. difficile negative.  Stool for GI PCR negative.  DC isolation currently on Rocephin and Flagyl.  Continue. -Abdominal pain and nausea slightly better.  Advance diet as below.  DC IV fluids.  Intractable nausea, vomiting and diarrhea with abdominal pain -Nausea and vomiting improving; diarrhea also improving.  Currently on round-the-clock Zofran: Switch to as needed Zofran.  Continue Reglan as needed for intractable nausea/vomiting.   -Tolerating full regular diet.  Advance to soft diet today. -Continue Protonix 40 mg IV every 12 hours.  Hyponatremia -Mild.  Monitor  Hyperkalemia -Resolved  Hypokalemia -Replace.  Repeat a.m. labs.  Hypomagnesemia -Replace.  Repeat a.m. labs.  Chronic respiratory failure with hypoxia COPD -On 5 L oxygen via nasal cannula at home.  Currently on 4 L oxygen via nasal cannula. -Continue nebs and current inhaled regimen  Thrombocytopenia -Questionable cause.  Monitor intermittently.  Normocytic anemia -?  Cause.  Hemoglobin stable.  Monitor intermittently.  Essential hypertension -Continue amlodipine.  Blood pressure intermittently elevated.  Monitor.   DVT prophylaxis: Heparin subcutaneous Code Status: Full Family Communication: None at bedside Disposition Plan: Status is: Inpatient Remains inpatient appropriate  because: Of severity of illness  Consultants: None  Procedures: None  Antimicrobials: Rocephin and Zithromax from 12/22/2022 onwards   Subjective: Patient seen and examined at bedside.  Feels slightly better.  Diarrhea is slowing down.  Last vomiting was yesterday.  Abdominal pain is also improving.  No fever, chest pain or shortness of breath reported. Objective: Vitals:   12/24/22 2050 12/25/22 0500 12/25/22 0732 12/25/22 0851  BP: (!) 136/99 (!) 112/91 (!) 126/90   Pulse: (!) 107 73 89   Resp:  19 18   Temp: 98.2 F (36.8 C) 98.2 F (36.8 C) 98.1 F (36.7 C)   TempSrc:  Oral Oral   SpO2: 91%  97% 98%  Weight:  76 kg    Height:        Intake/Output Summary (Last 24 hours) at 12/25/2022 1050 Last data filed at 12/25/2022 0423 Gross per 24 hour  Intake 1866.78 ml  Output --  Net 1866.78 ml   Filed Weights   12/22/22 1831 12/25/22 0500  Weight: 73.9 kg 76 kg    Examination:  General: On 4 L oxygen via nasal cannula.  No distress.  Chronically ill and deconditioned looking. ENT/neck: No thyromegaly.  JVD is not elevated  respiratory: Decreased breath sounds at bases bilaterally with some crackles; no wheezing  CVS: S1-S2 heard, rate controlled Abdominal: Soft, mildly tender, slightly distended; no organomegaly, bowel sounds are heard Extremities: Trace lower extremity edema; no cyanosis  CNS: Awake and alert.  No focal neurologic deficit.  Moves extremities Lymph: No obvious lymphadenopathy Skin: No obvious ecchymosis/lesions  psych: Affect is flat mostly.  Not agitated currently.   Musculoskeletal: No obvious joint swelling/deformity     Data Reviewed: I have personally reviewed following labs and imaging studies  CBC: Recent Labs  Lab 12/22/22 1903  12/23/22 0700 12/25/22 0158  WBC 9.7 6.3 6.3  NEUTROABS 7.2 3.5 3.3  HGB 14.7 12.8 11.7*  HCT 43.2 38.4 34.9*  MCV 95.8 97.2 96.7  PLT 216 152 142*   Basic Metabolic Panel: Recent Labs  Lab  12/22/22 1903 12/23/22 0700 12/24/22 0016 12/25/22 0158  NA 134* 136 133* 133*  K 5.3* 3.4* 3.9 3.1*  CL 99 95* 92* 96*  CO2 19* 28 27 26   GLUCOSE 100* 101* 106* 109*  BUN 17 10 <5* <5*  CREATININE 1.05* 0.94 0.76 0.80  CALCIUM 8.3* 8.1* 8.4* 8.0*  MG  --  1.1* 1.9 1.4*   GFR: Estimated Creatinine Clearance: 77.5 mL/min (by C-G formula based on SCr of 0.8 mg/dL). Liver Function Tests: Recent Labs  Lab 12/22/22 1903 12/24/22 0016 12/25/22 0158  AST 64* 76* 59*  ALT 23 23 21   ALKPHOS 124 119 90  BILITOT 1.5* 0.9 0.8  PROT 6.6 6.5 5.6*  ALBUMIN 3.9 3.7 3.1*   Recent Labs  Lab 12/22/22 1903  LIPASE 30   No results for input(s): "AMMONIA" in the last 168 hours. Coagulation Profile: No results for input(s): "INR", "PROTIME" in the last 168 hours. Cardiac Enzymes: No results for input(s): "CKTOTAL", "CKMB", "CKMBINDEX", "TROPONINI" in the last 168 hours. BNP (last 3 results) No results for input(s): "PROBNP" in the last 8760 hours. HbA1C: No results for input(s): "HGBA1C" in the last 72 hours. CBG: No results for input(s): "GLUCAP" in the last 168 hours. Lipid Profile: No results for input(s): "CHOL", "HDL", "LDLCALC", "TRIG", "CHOLHDL", "LDLDIRECT" in the last 72 hours. Thyroid Function Tests: No results for input(s): "TSH", "T4TOTAL", "FREET4", "T3FREE", "THYROIDAB" in the last 72 hours. Anemia Panel: No results for input(s): "VITAMINB12", "FOLATE", "FERRITIN", "TIBC", "IRON", "RETICCTPCT" in the last 72 hours. Sepsis Labs: Recent Labs  Lab 12/22/22 1907 12/22/22 2036  LATICACIDVEN 1.3 1.1    Recent Results (from the past 240 hour(s))  C Difficile Quick Screen w PCR reflex     Status: None   Collection Time: 12/22/22  7:28 PM   Specimen: STOOL  Result Value Ref Range Status   C Diff antigen NEGATIVE NEGATIVE Final   C Diff toxin NEGATIVE NEGATIVE Final   C Diff interpretation No C. difficile detected.  Final    Comment: Performed at [facility], [address], [place], [place] [address]  SARS Coronavirus 2 by RT PCR (hospital order, performed in [facility] hospital lab) *cepheid single result test* Anterior Nasal Swab     Status: None   Collection Time: 12/23/22  2:47 PM   Specimen: Anterior Nasal Swab  Result Value Ref Range Status   SARS Coronavirus 2 by RT PCR NEGATIVE NEGATIVE Final    Comment: Performed at [facility], [address], [place], [place] [address]  Gastrointestinal Panel by PCR , Stool     Status: None   Collection Time: 12/23/22  4:38 PM   Specimen: Stool  Result Value Ref Range Status   Campylobacter species NOT DETECTED NOT DETECTED Final   Plesimonas shigelloides NOT DETECTED NOT DETECTED Final   Salmonella species NOT DETECTED NOT DETECTED Final   Yersinia enterocolitica NOT DETECTED NOT DETECTED Final   Vibrio species NOT DETECTED NOT DETECTED Final   Vibrio cholerae NOT DETECTED NOT DETECTED Final   Enteroaggregative E coli (EAEC) NOT DETECTED NOT DETECTED Final   Enteropathogenic E coli (EPEC) NOT DETECTED NOT DETECTED Final   Enterotoxigenic E coli (ETEC) NOT DETECTED NOT DETECTED  Final   Shiga like toxin producing E coli (STEC) NOT DETECTED NOT DETECTED Final   Shigella/Enteroinvasive E coli (EIEC) NOT DETECTED NOT DETECTED Final   Cryptosporidium NOT DETECTED NOT DETECTED Final   Cyclospora cayetanensis NOT DETECTED NOT DETECTED Final   Entamoeba histolytica NOT DETECTED NOT DETECTED Final   Giardia lamblia NOT DETECTED NOT DETECTED Final   Adenovirus F40/41 NOT DETECTED NOT DETECTED Final   Astrovirus NOT DETECTED NOT DETECTED Final   Norovirus GI/GII NOT DETECTED NOT DETECTED Final   Rotavirus A NOT DETECTED NOT DETECTED Final   Sapovirus (I, II, IV, and V) NOT DETECTED NOT DETECTED Final    Comment: Performed at [facility], [address], [place], [place] [address]         Radiology Studies: No results found.      Scheduled Meds:  amLODipine  5 mg Oral Daily    heparin  5,000 Units Subcutaneous Q8H   melatonin  3 mg Oral QHS   mometasone-formoterol  2 puff Inhalation BID   ondansetron (ZOFRAN) IV  4 mg Intravenous Q6H   pantoprazole (PROTONIX) IV  40 mg Intravenous Q12H   potassium chloride  40 mEq Oral Q4H   rOPINIRole  1 mg Oral QHS   traZODone  50 mg Oral QHS   Continuous Infusions:  sodium chloride 50 mL/hr at 12/25/22 0641   cefTRIAXone (ROCEPHIN)  IV Stopped (12/24/22 1752)   magnesium sulfate bolus IVPB 2 g (12/25/22 1035)   metronidazole 500 mg (12/25/22 0925)   promethazine (PHENERGAN) injection (IM or IVPB) 12.5 mg (12/25/22 0511)          Glade Lloyd, MD [facility] 12/25/2022, 10:50 AM

## 2022-12-25 NOTE — Plan of Care (Signed)

## 2022-12-26 DIAGNOSIS — K529 Noninfective gastroenteritis and colitis, unspecified: Secondary | ICD-10-CM | POA: Diagnosis not present

## 2022-12-26 DIAGNOSIS — K5792 Diverticulitis of intestine, part unspecified, without perforation or abscess without bleeding: Secondary | ICD-10-CM | POA: Diagnosis not present

## 2022-12-26 MED ORDER — ORAL CARE MOUTH RINSE: 15.0000 mL | OROMUCOSAL | Status: DC | PRN

## 2022-12-26 MED ORDER — PANTOPRAZOLE SODIUM 40 MG PO TBEC
40.0000 mg | DELAYED_RELEASE_TABLET | Freq: Every day | ORAL | Status: DC
  Administered 2022-12-27: 40 mg via ORAL
  Filled 2022-12-26: qty 1

## 2022-12-26 MED ORDER — POTASSIUM CHLORIDE CRYS ER 20 MEQ PO TBCR
40.0000 meq | EXTENDED_RELEASE_TABLET | Freq: Two times a day (BID) | ORAL | Status: AC
  Administered 2022-12-26 (×2): 40 meq via ORAL
  Filled 2022-12-26 (×2): qty 2

## 2022-12-26 MED ORDER — METRONIDAZOLE 500 MG PO TABS
500.0000 mg | ORAL_TABLET | Freq: Two times a day (BID) | ORAL | Status: DC
  Administered 2022-12-26 – 2022-12-27 (×3): 500 mg via ORAL
  Filled 2022-12-26 (×3): qty 1

## 2022-12-26 MED ORDER — CEFDINIR 300 MG PO CAPS
300.0000 mg | ORAL_CAPSULE | Freq: Two times a day (BID) | ORAL | Status: DC
  Administered 2022-12-26 – 2022-12-27 (×3): 300 mg via ORAL
  Filled 2022-12-26 (×4): qty 1

## 2022-12-26 NOTE — Plan of Care (Signed)

## 2022-12-26 NOTE — Progress Notes (Signed)
[facility] PROGRESS NOTE    Progress Note  Vicki [name]  MWU:[id number] DOB: 04/25/1966 DOA: 12/22/2022 PCP: Park Meo, FNP     Brief Narrative:   Vicki [name] is an 57 y.o. 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 difficile colitis presents with worsening abdominal pain nausea and vomiting CT scan of the abdomen pelvis showed mild diverticulitis will start empirically on antibiotics and IV fluids.   Assessment/Plan:   Possible acute diverticulitis: C. difficile PCR is negative, stool GI PCR negative. Currently on IV Rocephin and Flagyl. Abdominal pain nausea is improved. Discontinue IV fluids. She has been having intermittent nausea but not vomiting, advance diet as tolerated. Home in 24 hours tolerates her diet.  Transition antibiotics to Augmentin and Flagyl.  Intractable nausea vomiting diarrhea and abdominal pain: Likely due to above continue Zofran for nausea and his diet to regular diet. Change Protonix to daily.  Hypovolemic hyponatremia: Resolved with fluid oral repletion.  Hypokalemia/hyperkalemia: BP tolerates potassium is improved.  Likely due to diarrhea. Try to keep potassium around 4.  Hypomagnesemia: Replace orally now 1.8, try to keep magnesium greater than 2.  Chronic respiratory failure with hypoxia on 5 L of oxygen/COPD: Stable continue oxygen.  Thrombocytopenia Slowly improving likely due to infectious etiology.  Normocytic anemia: Hemoglobin is stable follow-up with PCP as an outpatient.  Essential hypertension: Continue amlodipine.    COPD, severe (HCC)    DVT prophylaxis: lovenox Family Communication:none Status is: Inpatient Remains inpatient appropriate because: Acute diverticulitis    Code Status:     Code Status Orders  (From admission, onward)           Start      Ordered   12/23/22 0528  Full code  Continuous       Question:  By:  Answer:  Consent: discussion documented in EHR   12/23/22 0528           Code Status History     Date Active Date Inactive Code Status Order ID Comments User Context   04/11/2022 2317 04/16/2022 1714 Full Code [id number]  Angie Fava, DO ED   04/05/2022 1535 04/09/2022 1902 Full Code [id number]  Synetta Fail, MD ED   04/25/2021 2326 05/01/2021 1846 Full Code [id number]  Therisa Doyne, MD ED   10/18/2020 0844 10/21/2020 1742 Full Code [id number]  Jae Dire, MD ED   08/31/2020 1458 09/01/2020 1318 Full Code [id number]  [place] [facility]   04/07/2019 2049 04/11/2019 1650 Full Code [id number]  John Giovanni, MD ED         IV Access:   Peripheral IV   Procedures and diagnostic studies:   No results found.   Medical Consultants:   None.   Subjective:    Klair Ursery no complaints has had intermittent nausea with tolerating her diet no vomiting.  Objective:    Vitals:   12/26/22 0432 12/26/22 0500 12/26/22 0754 12/26/22 0840  BP: 119/80  110/76   Pulse: 71  73 72  Resp: 17  18 19   Temp: 98.2 F (36.8 C)  97.7 F (36.5 C)   TempSrc:   Oral   SpO2: 96%  97% 98%  Weight:  76 kg    Height:       SpO2: 98 % O2 Flow Rate (L/min): 4 L/min   Intake/Output Summary (Last 24 hours) at 12/26/2022 [phone]  Last data filed at 12/26/2022 0517 Gross per 24 hour  Intake 609.07 ml  Output --  Net 609.07 ml   Filed Weights   12/22/22 1831 12/25/22 0500 12/26/22 0500  Weight: 73.9 kg 76 kg 76 kg    Exam: General exam: In no acute distress. Respiratory system: Good air movement and clear to auscultation. Cardiovascular system: S1 & S2 heard, RRR. No JVD.  Gastrointestinal system: Abdomen is nondistended, soft and nontender.  Extremities: No pedal edema. Skin: No rashes, lesions or ulcers Psychiatry: Judgement and insight appear normal. Mood & affect  appropriate.    Data Reviewed:    Labs: Basic Metabolic Panel: Recent Labs  Lab 12/22/22 1903 12/23/22 0700 12/24/22 0016 12/25/22 0158 12/26/22 0134  NA 134* 136 133* 133* 135  K 5.3* 3.4* 3.9 3.1* 3.6  CL 99 95* 92* 96* 101  CO2 19* 28 27 26 24   GLUCOSE 100* 101* 106* 109* 128*  BUN 17 10 <5* <5* 9  CREATININE 1.05* 0.94 0.76 0.80 0.92  CALCIUM 8.3* 8.1* 8.4* 8.0* 8.1*  MG  --  1.1* 1.9 1.4* 1.8   GFR Estimated Creatinine Clearance: 67.4 mL/min (by C-G formula based on SCr of 0.92 mg/dL). Liver Function Tests: Recent Labs  Lab 12/22/22 1903 12/24/22 0016 12/25/22 0158  AST 64* 76* 59*  ALT 23 23 21   ALKPHOS 124 119 90  BILITOT 1.5* 0.9 0.8  PROT 6.6 6.5 5.6*  ALBUMIN 3.9 3.7 3.1*   Recent Labs  Lab 12/22/22 1903  LIPASE 30   No results for input(s): "AMMONIA" in the last 168 hours. Coagulation profile No results for input(s): "INR", "PROTIME" in the last 168 hours. COVID-19 Labs  No results for input(s): "DDIMER", "FERRITIN", "LDH", "CRP" in the last 72 hours.  Lab Results  Component Value Date   SARSCOV2NAA NEGATIVE 12/23/2022   SARSCOV2NAA NEGATIVE 04/05/2022   SARSCOV2NAA NEGATIVE 04/25/2021   SARSCOV2NAA NEGATIVE 10/18/2020    CBC: Recent Labs  Lab 12/22/22 1903 12/23/22 0700 12/25/22 0158 12/26/22 0134  WBC 9.7 6.3 6.3 6.1  NEUTROABS 7.2 3.5 3.3 3.3  HGB 14.7 12.8 11.7* 11.2*  HCT 43.2 38.4 34.9* 34.8*  MCV 95.8 97.2 96.7 97.2  PLT 216 152 142* 144*   Cardiac Enzymes: No results for input(s): "CKTOTAL", "CKMB", "CKMBINDEX", "TROPONINI" in the last 168 hours. BNP (last 3 results) No results for input(s): "PROBNP" in the last 8760 hours. CBG: No results for input(s): "GLUCAP" in the last 168 hours. D-Dimer: No results for input(s): "DDIMER" in the last 72 hours. Hgb A1c: No results for input(s): "HGBA1C" in the last 72 hours. Lipid Profile: No results for input(s): "CHOL", "HDL", "LDLCALC", "TRIG", "CHOLHDL", "LDLDIRECT" in the  last 72 hours. Thyroid function studies: No results for input(s): "TSH", "T4TOTAL", "T3FREE", "THYROIDAB" in the last 72 hours.  Invalid input(s): "FREET3" Anemia work up: No results for input(s): "VITAMINB12", "FOLATE", "FERRITIN", "TIBC", "IRON", "RETICCTPCT" in the last 72 hours. Sepsis Labs: Recent Labs  Lab 12/22/22 1903 12/22/22 1907 12/22/22 2036 12/23/22 0700 12/25/22 0158 12/26/22 0134  WBC 9.7  --   --  6.3 6.3 6.1  LATICACIDVEN  --  1.3 1.1  --   --   --    Microbiology Recent Results (from the past 240 hour(s))  C Difficile Quick Screen w PCR reflex     Status: None   Collection Time: 12/22/22  7:28 PM   Specimen: STOOL  Result Value Ref Range Status   C Diff antigen NEGATIVE NEGATIVE Final  C Diff toxin NEGATIVE NEGATIVE Final   C Diff interpretation No C. difficile detected.  Final    Comment: Performed at [facility], [address] [place], [place] [address]  SARS Coronavirus 2 by RT PCR (hospital order, performed in [facility] hospital lab) *cepheid single result test* Anterior Nasal Swab     Status: None   Collection Time: 12/23/22  2:47 PM   Specimen: Anterior Nasal Swab  Result Value Ref Range Status   SARS Coronavirus 2 by RT PCR NEGATIVE NEGATIVE Final    Comment: Performed at [facility], [address], [place], [place] [address]  Gastrointestinal Panel by PCR , Stool     Status: None   Collection Time: 12/23/22  4:38 PM   Specimen: Stool  Result Value Ref Range Status   Campylobacter species NOT DETECTED NOT DETECTED Final   Plesimonas shigelloides NOT DETECTED NOT DETECTED Final   Salmonella species NOT DETECTED NOT DETECTED Final   Yersinia enterocolitica NOT DETECTED NOT DETECTED Final   Vibrio species NOT DETECTED NOT DETECTED Final   Vibrio cholerae NOT DETECTED NOT DETECTED Final   Enteroaggregative E coli (EAEC) NOT DETECTED NOT DETECTED Final   Enteropathogenic E coli (EPEC) NOT DETECTED NOT DETECTED Final    Enterotoxigenic E coli (ETEC) NOT DETECTED NOT DETECTED Final   Shiga like toxin producing E coli (STEC) NOT DETECTED NOT DETECTED Final   Shigella/Enteroinvasive E coli (EIEC) NOT DETECTED NOT DETECTED Final   Cryptosporidium NOT DETECTED NOT DETECTED Final   Cyclospora cayetanensis NOT DETECTED NOT DETECTED Final   Entamoeba histolytica NOT DETECTED NOT DETECTED Final   Giardia lamblia NOT DETECTED NOT DETECTED Final   Adenovirus F40/41 NOT DETECTED NOT DETECTED Final   Astrovirus NOT DETECTED NOT DETECTED Final   Norovirus GI/GII NOT DETECTED NOT DETECTED Final   Rotavirus A NOT DETECTED NOT DETECTED Final   Sapovirus (I, II, IV, and V) NOT DETECTED NOT DETECTED Final    Comment: Performed at [facility], [address] [place], [place] [address]     Medications:    amLODipine  5 mg Oral Daily   heparin  5,000 Units Subcutaneous Q8H   melatonin  3 mg Oral QHS   mometasone-formoterol  2 puff Inhalation BID   pantoprazole (PROTONIX) IV  40 mg Intravenous Q12H   rOPINIRole  1 mg Oral QHS   traZODone  50 mg Oral QHS   Continuous Infusions:  cefTRIAXone (ROCEPHIN)  IV Stopped (12/25/22 1821)   metronidazole 500 mg (12/26/22 0853)      LOS: 3 days   Marinda Elk  [facility]  12/26/2022, 9:54 AM

## 2022-12-26 NOTE — Plan of Care (Signed)

## 2022-12-26 NOTE — Plan of Care (Signed)
Patient alert and oriented. Complained of abdominal pain, medicated with positive effect. No complaints of nausea. Patient with two small bowel movements this shift. Contact precautions maintained. Will continue to monitor.  Problem: Education: Goal: Knowledge of General Education information will improve Description: Including pain rating scale, medication(s)/side effects and non-pharmacologic comfort measures Outcome: Progressing   Problem: Health Behavior/Discharge Planning: Goal: Ability to manage health-related needs will improve Outcome: Progressing   Problem: Clinical Measurements: Goal: Ability to maintain clinical measurements within normal limits will improve Outcome: Progressing Goal: Will remain free from infection Outcome: Progressing Goal: Diagnostic test results will improve Outcome: Progressing Goal: Respiratory complications will improve Outcome: Progressing Goal: Cardiovascular complication will be avoided Outcome: Progressing   Problem: Activity: Goal: Risk for activity intolerance will decrease Outcome: Progressing   Problem: Nutrition: Goal: Adequate nutrition will be maintained Outcome: Progressing   Problem: Coping: Goal: Level of anxiety will decrease Outcome: Progressing   Problem: Elimination: Goal: Will not experience complications related to bowel motility Outcome: Progressing Goal: Will not experience complications related to urinary retention Outcome: Progressing   Problem: Pain Managment: Goal: General experience of comfort will improve Outcome: Progressing   Problem: Safety: Goal: Ability to remain free from injury will improve Outcome: Progressing   Problem: Skin Integrity: Goal: Risk for impaired skin integrity will decrease Outcome: Progressing

## 2022-12-27 ENCOUNTER — Other Ambulatory Visit: Payer: Self-pay | Admitting: Family Medicine

## 2022-12-27 DIAGNOSIS — K529 Noninfective gastroenteritis and colitis, unspecified: Secondary | ICD-10-CM | POA: Diagnosis not present

## 2022-12-27 DIAGNOSIS — F32A Depression, unspecified: Secondary | ICD-10-CM

## 2022-12-27 MED ORDER — HYDROCODONE-ACETAMINOPHEN 5-325 MG PO TABS: 1.0000 | ORAL_TABLET | ORAL | 0 refills | Status: DC | PRN

## 2022-12-27 MED ORDER — CEFDINIR 300 MG PO CAPS: 300.0000 mg | ORAL_CAPSULE | Freq: Two times a day (BID) | ORAL | 0 refills | Status: AC

## 2022-12-27 MED ORDER — ONDANSETRON HCL 4 MG PO TABS: 4.0000 mg | ORAL_TABLET | Freq: Every day | ORAL | 1 refills | Status: DC | PRN

## 2022-12-27 MED ORDER — METOCLOPRAMIDE HCL 10 MG PO TABS: 10.0000 mg | ORAL_TABLET | Freq: Three times a day (TID) | ORAL | 0 refills | Status: DC

## 2022-12-27 MED ORDER — METRONIDAZOLE 500 MG PO TABS: 500.0000 mg | ORAL_TABLET | Freq: Two times a day (BID) | ORAL | 0 refills | Status: AC

## 2022-12-27 NOTE — Progress Notes (Signed)
Patient discharged, discharge instructions given and explained. Patient has no additional questions and or concerns at this time. Patient IV removed per protocol. Patient awaiting wheelchair transport to discharge home for safety.

## 2022-12-27 NOTE — Progress Notes (Signed)
Mobility Specialist: Progress Note   12/27/22 1205  Mobility  Activity Ambulated with assistance in hallway  Level of Assistance Contact guard assist, steadying assist  Assistive Device Front wheel walker  Distance Ambulated (ft) 144 ft  Activity Response Tolerated well  Mobility Referral Yes  $Mobility charge 1 Mobility  Mobility Specialist Start Time (ACUTE ONLY) 1135  Mobility Specialist Stop Time (ACUTE ONLY) 1158  Mobility Specialist Time Calculation (min) (ACUTE ONLY) 23 min   Pre-Mobility: HR 77-79, SpO2 92-93% 4LO2 During Mobility: HR 92-100, SpO2 83-85% 4LO2 87-98% 6LO2 Post-Mobility: HR 80-81, SpO2 90-91% 4LO2  Pt was agreeable to mobility session - received in bed. Pt desat at 83-85% on 4LO2, took standing break and maintained mid 80s. Increased to 6LO2 - SpO2 95%. Continued ambulation, pt occasionally desat at 87-88% 6LO2 but recovered to 95-98% with standing breaks (2x). Returned to bed with all needs met, left on 4LO2 with SpO2 at 90-91%. Call bell in reach.    Maurene Capes Mobility Specialist Please contact via SecureChat or Rehab office at [phone]

## 2022-12-27 NOTE — Discharge Summary (Signed)
Physician Discharge Summary  Vicki [name] ZOX:[id number] DOB: 1965-10-13 DOA: 12/22/2022  PCP: Park Meo, FNP  Admit date: 12/22/2022 Discharge date: 12/27/2022  Admitted From: Home Disposition:  Home  Recommendations for Outpatient Follow-up:  Follow up with PCP in 1-2 weeks  Home Health:None  Equipment/Devices:None  Discharge Condition: Stable  CODE STATUS:Full  Diet recommendation: Low fat bland diet - avoid fatty/greasy/spicy foods    Brief/Interim Summary: Vicki [name] is an 57 y.o. 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 difficile colitis presents with worsening abdominal pain nausea and vomiting CT scan of the abdomen pelvis showed mild diverticulitis will start empirically on antibiotics and IV fluids.    Discharge Diagnoses:  Principal Problem:   Acute gastroenteritis Active Problems:   COPD, severe (HCC)   Hyperlipidemia   Tobacco abuse   Chronic respiratory failure with hypoxia (HCC)   Essential hypertension   Acute diverticulitis   Gastroenteritis   Anxiety and depression  Possible acute diverticulitis: C. difficile PCR is negative, stool GI PCR negative. Currently on IV Rocephin and Flagyl - transition to cefdinr/flagyl to complete course Abdominal pain nausea is markedly improved, nausea improving - vomiting resolved on bland diet   Intractable nausea vomiting diarrhea and abdominal pain: Continue supportive care, zofran/reglan, PPI   Hypovolemic hyponatremia: Resolved with fluid oral repletion.   Hypokalemia/hyperkalemia: BP tolerates potassium is improved.  Likely due to diarrhea. Try to keep potassium around 4.   Hypomagnesemia: Replace orally now 1.8, try to keep magnesium greater than 2.   Chronic respiratory failure with hypoxia on 5 L of oxygen/COPD: Stable continue oxygen.    Thrombocytopenia Slowly improving likely due to infectious etiology.   Normocytic anemia: Hemoglobin is stable follow-up with PCP as an outpatient.   Essential hypertension: Continue amlodipine.   COPD, severe (HCC) Continue home nebs/meds - not in acute exacerbation Discharge Instructions   Allergies as of 12/27/2022       Reactions   Jasmine Oil Shortness Of Breath, Swelling   Levaquin [levofloxacin] Shortness Of Breath, Itching, Swelling, Rash   Penicillins Anaphylaxis, Hives   Tolerated ceftriaxone   Aleve [naproxen Sodium] Itching   Ceftin [cefuroxime Axetil] Other (See Comments)   unsure   Chlorhexidine    Depo-provera [medroxyprogesterone Acetate] Other (See Comments)   Severe acne   Doxycycline Other (See Comments)   Raises blood pressure    Tramadol Other (See Comments)   Kept her up all night        Medication List     STOP taking these medications    promethazine 25 MG tablet Commonly known as: PHENERGAN       TAKE these medications    acetaminophen 500 MG tablet Commonly known as: TYLENOL Take 500 mg by mouth every 6 (six) hours as needed for moderate pain.   albuterol 108 (90 Base) MCG/ACT inhaler Commonly known as: VENTOLIN HFA INHALE 2 PUFFS INTO THE LUNGS TWICE A DAY What changed:  how much to take how to take this when to take this additional instructions   ALPRAZolam 1 MG tablet Commonly known as: XANAX TAKE 1 TABLET BY MOUTH THREE TIMES A DAY AS NEEDED FOR ANXIETY Strength: 1 mg   amLODipine 5 MG tablet Commonly known as: NORVASC Take 1 tablet (5 mg total) by mouth daily.   Aspirin Low Dose 81 MG tablet Generic drug: aspirin EC TAKE 1 TABLET BY MOUTH 2  TIMES DAILY. What changed:  how much to take when to take this   azelastine 0.1 % nasal spray Commonly known as: ASTELIN Place 1 spray into both nostrils 2 (two) times daily. Use in each nostril as directed What changed:  when to take this additional instructions    cefdinir 300 MG capsule Commonly known as: OMNICEF Take 1 capsule (300 mg total) by mouth every 12 (twelve) hours for 3 days.   EPINEPHrine 0.3 mg/0.3 mL Soaj injection Commonly known as: EpiPen 2-Pak Inject 0.3 mg into the muscle as needed for anaphylaxis.   HYDROcodone-acetaminophen 5-325 MG tablet Commonly known as: NORCO/VICODIN Take 1 tablet by mouth every 4 (four) hours as needed for up to 3 days for moderate pain.   hydrOXYzine 25 MG tablet Commonly known as: ATARAX TAKE 0.5-1 TABLETS (12.5-25 MG TOTAL) BY MOUTH 3 (THREE) TIMES DAILY AS NEEDED FOR ANXIETY.   ipratropium-albuterol 0.5-2.5 (3) MG/3ML Soln Commonly known as: DUONEB Use 1 vial by nebulization in the morning and at bedtime.   Magnesium Citrate 100 MG Tabs Take 1 tablet by mouth at bedtime. What changed: how much to take   meclizine 25 MG tablet Commonly known as: ANTIVERT Take 1 tablet (25 mg total) by mouth 3 (three) times daily as needed for dizziness.   metoCLOPramide 10 MG tablet Commonly known as: REGLAN Take 1 tablet (10 mg total) by mouth 3 (three) times daily with meals.   metroNIDAZOLE 500 MG tablet Commonly known as: FLAGYL Take 1 tablet (500 mg total) by mouth every 12 (twelve) hours for 3 days.   omeprazole 20 MG capsule Commonly known as: PRILOSEC Take 1 capsule (20 mg total) by mouth daily.   ondansetron 4 MG tablet Commonly known as: Zofran Take 1 tablet (4 mg total) by mouth daily as needed for nausea or vomiting. What changed:  medication strength how much to take when to take this   potassium chloride SA 20 MEQ tablet Commonly known as: KLOR-CON M Take 1 tablet (20 mEq total) by mouth daily.   rizatriptan 10 MG tablet Commonly known as: MAXALT TAKE 1 TABLET BY MOUTH AS NEEDED FOR MIGRAINE. MAY REPEAT IN 2 HOURS IF NEEDED STRENGTH: 10 MG   rOPINIRole 2 MG tablet Commonly known as: REQUIP Take 0.5-1 tablets (1-2 mg total) by mouth at bedtime.   traZODone 50 MG  tablet Commonly known as: DESYREL Take 1 tablet (50 mg total) by mouth at bedtime.   Trelegy Ellipta 200-62.5-25 MCG/ACT Aepb Generic drug: Fluticasone-Umeclidin-Vilant Inhale 1 puff into the lungs daily.        Follow-up Information     Park Meo, FNP Follow up.   Specialty: Family Medicine Contact information: [address] [place] [address] [phone]                Allergies  Allergen Reactions   Jasmine Oil Shortness Of Breath and Swelling   Levaquin [Levofloxacin] Shortness Of Breath, Itching, Swelling and Rash   Penicillins Anaphylaxis and Hives    Tolerated ceftriaxone   Aleve [Naproxen Sodium] Itching   Ceftin [Cefuroxime Axetil] Other (See Comments)    unsure   Chlorhexidine    Depo-Provera [Medroxyprogesterone Acetate] Other (See Comments)    Severe acne   Doxycycline Other (See Comments)    Raises blood pressure    Tramadol Other (See Comments)    Kept her up all night    Consultations: None  Procedures/Studies: CT Head Wo Contrast  Result Date: 12/22/2022 CLINICAL  DATA:  Trauma EXAM: CT HEAD WITHOUT CONTRAST TECHNIQUE: Contiguous axial images were obtained from the base of the skull through the vertex without intravenous contrast. RADIATION DOSE REDUCTION: This exam was performed according to the departmental dose-optimization program which includes automated exposure control, adjustment of the mA and/or kV according to patient size and/or use of iterative reconstruction technique. COMPARISON:  03/16/2019 FINDINGS: Brain: No evidence of acute infarction, hemorrhage, hydrocephalus, extra-axial collection or mass lesion/mass effect. Old left basal ganglia lacunar infarct versus prominent VR space (series 3/image 14). Vascular: No hyperdense vessel or unexpected calcification. Skull: Normal. Negative for fracture or focal lesion. Sinuses/Orbits: Mild mucosal thickening of the bilateral maxillary sinuses and right sphenoid sinus. Partial  opacification of the left sphenoid sinus. Other: None. IMPRESSION: No acute intracranial abnormality. Electronically Signed   By: Charline Bills M.D.   On: 12/22/2022 20:29   CT ABDOMEN PELVIS W CONTRAST  Result Date: 12/22/2022 CLINICAL DATA:  Abdominal pain. EXAM: CT ABDOMEN AND PELVIS WITH CONTRAST TECHNIQUE: Multidetector CT imaging of the abdomen and pelvis was performed using the standard protocol following bolus administration of intravenous contrast. RADIATION DOSE REDUCTION: This exam was performed according to the departmental dose-optimization program which includes automated exposure control, adjustment of the mA and/or kV according to patient size and/or use of iterative reconstruction technique. CONTRAST:  75mL OMNIPAQUE IOHEXOL 350 MG/ML SOLN COMPARISON:  CT abdomen pelvis dated 04/11/2022. FINDINGS: Lower chest: The visualized lung bases are clear. No intra-abdominal free air or free fluid. Hepatobiliary: Fatty liver. No biliary dilation. Cholecystectomy. No retained calcified stone noted in the central CBD. Pancreas: Unremarkable. No pancreatic ductal dilatation or surrounding inflammatory changes. Spleen: Normal in size without focal abnormality. Adrenals/Urinary Tract: The adrenal glands are unremarkable. There is no hydronephrosis on either side. There is symmetric enhancement and excretion of contrast by both kidneys. The visualized ureters and urinary bladder appear unremarkable. Stomach/Bowel: There is sigmoid diverticulosis with muscular hypertrophy. Minimal perisigmoid haziness, likely chronic. Mild acute diverticulitis is not excluded. Clinical correlation is recommended. No diverticular abscess or perforation. There is no bowel obstruction. The appendix is normal. Vascular/Lymphatic: Mild aortoiliac atherosclerotic disease. The IVC is unremarkable. No portal venous gas. There is no adenopathy. Reproductive: Hysterectomy.  No adnexal masses. Other: None Musculoskeletal: Osteopenia  with degenerative changes of the spine. Avascular necrosis of the right femoral head. No cortical collapse. No acute osseous pathology. IMPRESSION: 1. Sigmoid diverticulosis with muscular hypertrophy. Mild acute diverticulitis is not excluded. 2. Fatty liver. 3.  Aortic Atherosclerosis (ICD10-I70.0). Electronically Signed   By: Elgie Collard M.D.   On: 12/22/2022 20:25   MM 3D SCREENING MAMMOGRAM BILATERAL BREAST  Result Date: 12/06/2022 CLINICAL DATA:  Screening. EXAM: DIGITAL SCREENING BILATERAL MAMMOGRAM WITH TOMOSYNTHESIS AND CAD TECHNIQUE: Bilateral screening digital craniocaudal and mediolateral oblique mammograms were obtained. Bilateral screening digital breast tomosynthesis was performed. The images were evaluated with computer-aided detection. COMPARISON:  Previous exam(s). ACR Breast Density Category c: The breasts are heterogeneously dense, which may obscure small masses. FINDINGS: There are no findings suspicious for malignancy. IMPRESSION: No mammographic evidence of malignancy. A result letter of this screening mammogram will be mailed directly to the patient. RECOMMENDATION: Screening mammogram in one year. (Code:[id number]) BI-RADS CATEGORY  1: Negative. Electronically Signed   By: Elberta Fortis M.D.   On: 12/06/2022 09:09     Subjective: No acute issues/events - minimal vomiting overnight after large 'normal' dinner - we discussed bland diet which patient tolerated both breakfast/lunch without difficulty.   Discharge Exam: Vitals:  12/27/22 0802 12/27/22 0811  BP:  114/81  Pulse:  75  Resp:  18  Temp:  98.1 F (36.7 C)  SpO2: 94% 92%   Vitals:   12/26/22 2046 12/27/22 0432 12/27/22 0802 12/27/22 0811  BP:  (!) 155/97  114/81  Pulse: 84   75  Resp: 20 17  18   Temp:  99 F (37.2 C)  98.1 F (36.7 C)  TempSrc:    Oral  SpO2: 93% 90% 94% 92%  Weight:      Height:        General: Pt is alert, awake, not in acute distress Cardiovascular: RRR, S1/S2 +, no rubs, no  gallops Respiratory: CTA bilaterally, no wheezing, no rhonchi Abdominal: Soft, NT, ND, bowel sounds + Extremities: no edema, no cyanosis    The results of significant diagnostics from this hospitalization (including imaging, microbiology, ancillary and laboratory) are listed below for reference.     Microbiology: Recent Results (from the past 240 hour(s))  C Difficile Quick Screen w PCR reflex     Status: None   Collection Time: 12/22/22  7:28 PM   Specimen: STOOL  Result Value Ref Range Status   C Diff antigen NEGATIVE NEGATIVE Final   C Diff toxin NEGATIVE NEGATIVE Final   C Diff interpretation No C. difficile detected.  Final    Comment: Performed at [facility], [address], [place], [place] [address]  SARS Coronavirus 2 by RT PCR (hospital order, performed in [facility] hospital lab) *cepheid single result test* Anterior Nasal Swab     Status: None   Collection Time: 12/23/22  2:47 PM   Specimen: Anterior Nasal Swab  Result Value Ref Range Status   SARS Coronavirus 2 by RT PCR NEGATIVE NEGATIVE Final    Comment: Performed at [facility], [address], [place], [place] [address]  Gastrointestinal Panel by PCR , Stool     Status: None   Collection Time: 12/23/22  4:38 PM   Specimen: Stool  Result Value Ref Range Status   Campylobacter species NOT DETECTED NOT DETECTED Final   Plesimonas shigelloides NOT DETECTED NOT DETECTED Final   Salmonella species NOT DETECTED NOT DETECTED Final   Yersinia enterocolitica NOT DETECTED NOT DETECTED Final   Vibrio species NOT DETECTED NOT DETECTED Final   Vibrio cholerae NOT DETECTED NOT DETECTED Final   Enteroaggregative E coli (EAEC) NOT DETECTED NOT DETECTED Final   Enteropathogenic E coli (EPEC) NOT DETECTED NOT DETECTED Final   Enterotoxigenic E coli (ETEC) NOT DETECTED NOT DETECTED Final   Shiga like toxin producing E coli (STEC) NOT DETECTED NOT DETECTED Final   Shigella/Enteroinvasive E coli (EIEC) NOT DETECTED  NOT DETECTED Final   Cryptosporidium NOT DETECTED NOT DETECTED Final   Cyclospora cayetanensis NOT DETECTED NOT DETECTED Final   Entamoeba histolytica NOT DETECTED NOT DETECTED Final   Giardia lamblia NOT DETECTED NOT DETECTED Final   Adenovirus F40/41 NOT DETECTED NOT DETECTED Final   Astrovirus NOT DETECTED NOT DETECTED Final   Norovirus GI/GII NOT DETECTED NOT DETECTED Final   Rotavirus A NOT DETECTED NOT DETECTED Final   Sapovirus (I, II, IV, and V) NOT DETECTED NOT DETECTED Final    Comment: Performed at [facility], [address] [place], [place] [address]     Labs: BNP (last 3 results) Recent Labs    04/05/22 1336 04/15/22 0038  BNP 59.4 149.2*   Basic Metabolic Panel: Recent Labs  Lab 12/22/22 1903 12/23/22 0700 12/24/22  0016 12/25/22 0158 12/26/22 0134  NA 134* 136 133* 133* 135  K 5.3* 3.4* 3.9 3.1* 3.6  CL 99 95* 92* 96* 101  CO2 19* 28 27 26 24   GLUCOSE 100* 101* 106* 109* 128*  BUN 17 10 <5* <5* 9  CREATININE 1.05* 0.94 0.76 0.80 0.92  CALCIUM 8.3* 8.1* 8.4* 8.0* 8.1*  MG  --  1.1* 1.9 1.4* 1.8   Liver Function Tests: Recent Labs  Lab 12/22/22 1903 12/24/22 0016 12/25/22 0158  AST 64* 76* 59*  ALT 23 23 21   ALKPHOS 124 119 90  BILITOT 1.5* 0.9 0.8  PROT 6.6 6.5 5.6*  ALBUMIN 3.9 3.7 3.1*   Recent Labs  Lab 12/22/22 1903  LIPASE 30   CBC: Recent Labs  Lab 12/22/22 1903 12/23/22 0700 12/25/22 0158 12/26/22 0134  WBC 9.7 6.3 6.3 6.1  NEUTROABS 7.2 3.5 3.3 3.3  HGB 14.7 12.8 11.7* 11.2*  HCT 43.2 38.4 34.9* 34.8*  MCV 95.8 97.2 96.7 97.2  PLT 216 152 142* 144*   Urinalysis    Component Value Date/Time   COLORURINE YELLOW 12/22/2022 1955   APPEARANCEUR CLEAR 12/22/2022 1955   APPEARANCEUR Cloudy (A) 04/23/2019 1141   LABSPEC 1.016 12/22/2022 1955   PHURINE 5.0 12/22/2022 1955   GLUCOSEU NEGATIVE 12/22/2022 1955   GLUCOSEU NEGATIVE 08/18/2020 1346   HGBUR NEGATIVE 12/22/2022 1955   BILIRUBINUR NEGATIVE 12/22/2022  1955   BILIRUBINUR negative 07/14/2019 1342   BILIRUBINUR Negative 04/23/2019 1141   KETONESUR 20 (A) 12/22/2022 1955   PROTEINUR NEGATIVE 12/22/2022 1955   UROBILINOGEN 0.2 08/18/2020 1346   NITRITE NEGATIVE 12/22/2022 1955   LEUKOCYTESUR NEGATIVE 12/22/2022 1955   Sepsis Labs Recent Labs  Lab 12/22/22 1903 12/23/22 0700 12/25/22 0158 12/26/22 0134  WBC 9.7 6.3 6.3 6.1   Microbiology Recent Results (from the past 240 hour(s))  C Difficile Quick Screen w PCR reflex     Status: None   Collection Time: 12/22/22  7:28 PM   Specimen: STOOL  Result Value Ref Range Status   C Diff antigen NEGATIVE NEGATIVE Final   C Diff toxin NEGATIVE NEGATIVE Final   C Diff interpretation No C. difficile detected.  Final    Comment: Performed at [facility], [address], [place], [place] [address]  SARS Coronavirus 2 by RT PCR (hospital order, performed in [facility] hospital lab) *cepheid single result test* Anterior Nasal Swab     Status: None   Collection Time: 12/23/22  2:47 PM   Specimen: Anterior Nasal Swab  Result Value Ref Range Status   SARS Coronavirus 2 by RT PCR NEGATIVE NEGATIVE Final    Comment: Performed at [facility], [address] [place], [place] [address]  Gastrointestinal Panel by PCR , Stool     Status: None   Collection Time: 12/23/22  4:38 PM   Specimen: Stool  Result Value Ref Range Status   Campylobacter species NOT DETECTED NOT DETECTED Final   Plesimonas shigelloides NOT DETECTED NOT DETECTED Final   Salmonella species NOT DETECTED NOT DETECTED Final   Yersinia enterocolitica NOT DETECTED NOT DETECTED Final   Vibrio species NOT DETECTED NOT DETECTED Final   Vibrio cholerae NOT DETECTED NOT DETECTED Final   Enteroaggregative E coli (EAEC) NOT DETECTED NOT DETECTED Final   Enteropathogenic E coli (EPEC) NOT DETECTED NOT DETECTED Final   Enterotoxigenic E coli (ETEC) NOT DETECTED NOT DETECTED Final   Shiga like toxin producing E coli (STEC) NOT  DETECTED NOT  DETECTED Final   Shigella/Enteroinvasive E coli (EIEC) NOT DETECTED NOT DETECTED Final   Cryptosporidium NOT DETECTED NOT DETECTED Final   Cyclospora cayetanensis NOT DETECTED NOT DETECTED Final   Entamoeba histolytica NOT DETECTED NOT DETECTED Final   Giardia lamblia NOT DETECTED NOT DETECTED Final   Adenovirus F40/41 NOT DETECTED NOT DETECTED Final   Astrovirus NOT DETECTED NOT DETECTED Final   Norovirus GI/GII NOT DETECTED NOT DETECTED Final   Rotavirus A NOT DETECTED NOT DETECTED Final   Sapovirus (I, II, IV, and V) NOT DETECTED NOT DETECTED Final    Comment: Performed at [facility], [address] [place], [place] [address]     Time coordinating discharge: Over 30 minutes  SIGNED:   Azucena Fallen, DO [facility] 12/27/2022, 1:58 PM Pager   If 7PM-7AM, please contact night-coverage [email or web address]

## 2022-12-27 NOTE — Plan of Care (Signed)

## 2022-12-27 NOTE — Care Management Important Message (Signed)
Important Message  Patient Details  Name: Vicki [name] MRN: [id number] Date of Birth: August 25, 1965   Medicare Important Message Given:  Yes     Sherilyn Banker 12/27/2022, 1:04 PM

## 2022-12-28 ENCOUNTER — Other Ambulatory Visit (HOSPITAL_COMMUNITY): Payer: Self-pay

## 2022-12-28 ENCOUNTER — Telehealth: Payer: Self-pay

## 2022-12-28 ENCOUNTER — Other Ambulatory Visit: Payer: Self-pay | Admitting: Internal Medicine

## 2022-12-28 MED ORDER — HYDROCODONE-ACETAMINOPHEN 5-325 MG PO TABS
1.0000 | ORAL_TABLET | ORAL | 0 refills | Status: AC | PRN
Start: 2022-12-28 — End: 2022-12-31
  Filled 2022-12-28: qty 18, 3d supply, fill #0

## 2022-12-28 NOTE — Transitions of Care (Post Inpatient/ED Visit) (Signed)
12/28/2022  Name: Vicki [name] MRN: [id number] DOB: Apr 29, 1966  Today's TOC FU Call Status: Today's TOC FU Call Status:: Successful TOC FU Call Completed TOC FU Call Complete Date: 12/28/22  Transition Care Management Follow-up Telephone Call Date of Discharge: 12/27/22 Discharge Facility: [facility]) Type of Discharge: Inpatient Admission Primary Inpatient Discharge Diagnosis:: Acute Gastroenteritis How have you been since you were released from the hospital?: Same (Patient notes her pain comes in waves) Any questions or concerns?: Yes Patient Questions/Concerns:: [facility]  New order put in today for [facility] Patient Questions/Concerns Addressed: Other: (Patient notified of new prescription)  Items Reviewed: Did you receive and understand the discharge instructions provided?: Yes Medications obtained,verified, and reconciled?: Partial Review Completed Reason for Partial Mediation Review: Discussed discharge medications as pt not feeling well Any new allergies since your discharge?: No Dietary orders reviewed?: No Do you have support at home?: Yes People in Home: spouse Name of Support/Comfort Primary Source: Vicki [name]  Medications Reviewed Today: Medications Reviewed Today     Reviewed by Jodelle Gross, RN (Case Manager) on 12/28/22 at 1426  Med List Status: <None>   Medication Order Taking? Sig Documenting Provider Last Dose Status Informant  acetaminophen (TYLENOL) 500 MG tablet [id number]  Take 500 mg by mouth every 6 (six) hours as needed for moderate pain. [provider]  Active Self  albuterol (VENTOLIN HFA) 108 (90 Base) MCG/ACT inhaler [id number]  INHALE 2 PUFFS INTO THE LUNGS TWICE A DAY  Patient taking differently: Inhale 2 puffs into the lungs every 4 (four) hours.   [name], NP  Active Self  ALPRAZolam Prudy Feeler) 1 MG tablet [id number]  TAKE 1 TABLET BY MOUTH THREE TIMES A DAY AS NEEDED FOR  ANXIETY Strength: 1 mg Park Meo, FNP  Active Self  amLODipine (NORVASC) 5 MG tablet [id number]  Take 1 tablet (5 mg total) by mouth daily. [name], NP  Active Self           Med Note Vicki [name] Dec 23, 2022  3:47 PM) Patient not sure about this medicine  ASPIRIN LOW DOSE 81 MG EC tablet [id number]  TAKE 1 TABLET BY MOUTH 2 TIMES DAILY.  Patient taking differently: Take 81 mg by mouth in the morning and at bedtime.   [name], NP  Active Self  azelastine (ASTELIN) 0.1 % nasal spray [id number]  Place 1 spray into both nostrils 2 (two) times daily. Use in each nostril as directed  Patient taking differently: Place 1 spray into both nostrils daily.   [name], NP  Active Self  cefdinir (OMNICEF) 300 MG capsule [id number] Yes Take 1 capsule (300 mg total) by mouth every 12 (twelve) hours for 3 days. Azucena Fallen, MD Taking Active   EPINEPHrine (EPIPEN 2-PAK) 0.3 mg/0.3 mL IJ SOAJ injection [id number]  Inject 0.3 mg into the muscle as needed for anaphylaxis. [name], NP  Active Self           Med Note Vicki [name] Dec 23, 2022  3:48 PM)    HYDROcodone-acetaminophen (NORCO/VICODIN) 5-325 MG tablet [id number] No Take 1 tablet by mouth every 4 (four) hours as needed for up to 3 days for moderate pain.  Patient not taking: Reported on 12/28/2022   Vicki Blood, MD Not Taking Active            Med Note Vicki [name], [facility]   Fri Dec 28, 2022  2:25 PM) Patient has to pick up at new pharmacy  hydrOXYzine (ATARAX) 25 MG tablet [id number]  TAKE 0.5-1 TABLETS (12.5-25 MG TOTAL) BY MOUTH 3 (THREE) TIMES DAILY AS NEEDED FOR ANXIETY. [name], NP  Active Self  ipratropium-albuterol (DUONEB) 0.5-2.5 (3) MG/3ML SOLN [id number]  Use 1 vial by nebulization in the morning and at bedtime. [name], NP  Active Self  Magnesium Citrate 100 MG TABS [id number]  Take 1 tablet by mouth at bedtime.  Patient taking differently: Take 100 mg by mouth at  bedtime.   [name], NP  Active Self  meclizine (ANTIVERT) 25 MG tablet [id number]  Take 1 tablet (25 mg total) by mouth 3 (three) times daily as needed for dizziness. [name], NP  Active Self  metoCLOPramide (REGLAN) 10 MG tablet [id number] Yes Take 1 tablet (10 mg total) by mouth 3 (three) times daily with meals. Azucena Fallen, MD Taking Active   metroNIDAZOLE (FLAGYL) 500 MG tablet [id number] Yes Take 1 tablet (500 mg total) by mouth every 12 (twelve) hours for 3 days. Azucena Fallen, MD Taking Active   omeprazole (PRILOSEC) 20 MG capsule [id number]  Take 1 capsule (20 mg total) by mouth daily. Park Meo, FNP  Active Self  ondansetron (ZOFRAN) 4 MG tablet [id number]  Take 1 tablet (4 mg total) by mouth daily as needed for nausea or vomiting. Azucena Fallen, MD  Active   potassium chloride SA (KLOR-CON M) 20 MEQ tablet [id number]  Take 1 tablet (20 mEq total) by mouth daily. [name], NP  Active Self  rizatriptan (MAXALT) 10 MG tablet [id number]  TAKE 1 TABLET BY MOUTH AS NEEDED FOR MIGRAINE. MAY REPEAT IN 2 HOURS IF NEEDED STRENGTH: 10 MG Park Meo, FNP  Active Self  rOPINIRole (REQUIP) 2 MG tablet [id number]  Take 0.5-1 tablets (1-2 mg total) by mouth at bedtime. [name], NP  Active Self  traZODone (DESYREL) 50 MG tablet [id number]  Take 1 tablet (50 mg total) by mouth at bedtime. [name], NP  Active Self  TRELEGY ELLIPTA 200-62.5-25 MCG/ACT AEPB [id number]  Inhale 1 puff into the lungs daily. [provider]  Active Self            Home Care and Equipment/Supplies: Were Home Health Services Ordered?: No Any new equipment or medical supplies ordered?: No  Functional Questionnaire: Do you need assistance with bathing/showering or dressing?: No Do you need assistance with meal preparation?: No Do you need assistance with eating?: No Do you have difficulty maintaining continence: No Do you need assistance with getting  out of bed/getting out of a chair/moving?: No Do you have difficulty managing or taking your medications?: No  Follow up appointments reviewed: PCP Follow-up appointment confirmed?: No MD Provider Line [phone] Given:  (Patient to call for appt.) Specialist Hospital Follow-up appointment confirmed?: Yes Date of Specialist follow-up appointment?: 01/01/23 Follow-Up Specialty Provider:: Dr. Wynona Neat Do you need transportation to your follow-up appointment?: No Do you understand care options if your condition(s) worsen?: Yes-patient verbalized understanding  Jodelle Gross, RN, BSN, CCM Care Management Coordinator [facility] Phone: [phone]/Fax: [phone]

## 2022-12-28 NOTE — Progress Notes (Signed)
   I was contacted as the Director on Call, and informed [facility] did not have hydrocodone-APAP in stock. I was asked to send the Rx to an alternate pharmacy.  I confirmed in the D/C summary by Dr. Natale Milch that this med was in fact intended to be prescribed. I place a new order for the same med in the same quantity to the new pharmacy.   Lonia Blood, MD [facility] Office  [phone]

## 2022-12-31 ENCOUNTER — Other Ambulatory Visit: Payer: Self-pay | Admitting: Family Medicine

## 2022-12-31 DIAGNOSIS — G43909 Migraine, unspecified, not intractable, without status migrainosus: Secondary | ICD-10-CM

## 2023-01-01 ENCOUNTER — Ambulatory Visit: Payer: Medicare HMO | Admitting: Pulmonary Disease

## 2023-01-08 ENCOUNTER — Ambulatory Visit: Payer: Medicare HMO | Admitting: Pulmonary Disease

## 2023-01-08 ENCOUNTER — Encounter: Payer: Self-pay | Admitting: Pulmonary Disease

## 2023-01-08 VITALS — BP 110/70 | HR 105 | Temp 97.7°F | Ht 63.0 in | Wt 176.6 lb

## 2023-01-08 DIAGNOSIS — F17200 Nicotine dependence, unspecified, uncomplicated: Secondary | ICD-10-CM | POA: Diagnosis not present

## 2023-01-08 DIAGNOSIS — J441 Chronic obstructive pulmonary disease with (acute) exacerbation: Secondary | ICD-10-CM | POA: Diagnosis not present

## 2023-01-08 DIAGNOSIS — J449 Chronic obstructive pulmonary disease, unspecified: Secondary | ICD-10-CM

## 2023-01-08 DIAGNOSIS — R911 Solitary pulmonary nodule: Secondary | ICD-10-CM

## 2023-01-08 NOTE — Progress Notes (Signed)
Vicki [name]    [id number]    [birth year]  Primary Care Physician:Howard, Binnie Rail, FNP  Referring Physician: Park Meo, FNP [address] [place],  [place] [address]  Chief complaint:   Patient being seen for obstructive lung disease  HPI:  She was recently hospitalized for COPD exacerbation  An active smoker, known COPD, chronic respiratory failure, anxiety/depression restless leg syndrome  Hospitalized for shortness of breath  Has been feeling relatively well Continues to use oxygen supplementation  Denies any chest pains or chest discomfort She is short of breath with activity  Has been on about 4 L of oxygen, does increase oxygen supplementation with activity   Outpatient Encounter Medications as of 01/08/2023  Medication Sig   acetaminophen (TYLENOL) 500 MG tablet Take 500 mg by mouth every 6 (six) hours as needed for moderate pain.   albuterol (VENTOLIN HFA) 108 (90 Base) MCG/ACT inhaler INHALE 2 PUFFS INTO THE LUNGS TWICE A DAY (Patient taking differently: Inhale 2 puffs into the lungs every 4 (four) hours.)   ALPRAZolam (XANAX) 1 MG tablet TAKE 1 TABLET BY MOUTH THREE TIMES A DAY AS NEEDED FOR ANXIETY STRENGTH: 1 MG   amLODipine (NORVASC) 5 MG tablet Take 1 tablet (5 mg total) by mouth daily.   ASPIRIN LOW DOSE 81 MG EC tablet TAKE 1 TABLET BY MOUTH 2 TIMES DAILY. (Patient taking differently: Take 81 mg by mouth in the morning and at bedtime.)   azelastine (ASTELIN) 0.1 % nasal spray Place 1 spray into both nostrils 2 (two) times daily. Use in each nostril as directed (Patient taking differently: Place 1 spray into both nostrils daily.)   EPINEPHrine (EPIPEN 2-PAK) 0.3 mg/0.3 mL IJ SOAJ injection Inject 0.3 mg into the muscle as needed for anaphylaxis.   hydrOXYzine (ATARAX) 25 MG tablet TAKE 0.5-1 TABLETS (12.5-25 MG TOTAL) BY MOUTH 3 (THREE) TIMES DAILY AS NEEDED FOR ANXIETY.   ipratropium-albuterol (DUONEB) 0.5-2.5 (3) MG/3ML SOLN Use 1 vial  by nebulization in the morning and at bedtime.   Magnesium Citrate 100 MG TABS Take 1 tablet by mouth at bedtime. (Patient taking differently: Take 100 mg by mouth at bedtime.)   meclizine (ANTIVERT) 25 MG tablet Take 1 tablet (25 mg total) by mouth 3 (three) times daily as needed for dizziness.   metoCLOPramide (REGLAN) 10 MG tablet Take 1 tablet (10 mg total) by mouth 3 (three) times daily with meals.   omeprazole (PRILOSEC) 20 MG capsule Take 1 capsule (20 mg total) by mouth daily.   ondansetron (ZOFRAN) 4 MG tablet Take 1 tablet (4 mg total) by mouth daily as needed for nausea or vomiting.   potassium chloride SA (KLOR-CON M) 20 MEQ tablet Take 1 tablet (20 mEq total) by mouth daily.   rizatriptan (MAXALT) 10 MG tablet TAKE 1 TABLET BY MOUTH AS NEEDED FOR MIGRAINE. MAY REPEAT IN 2 HOURS IF NEEDED STRENGTH: 10 MG   rOPINIRole (REQUIP) 2 MG tablet Take 0.5-1 tablets (1-2 mg total) by mouth at bedtime.   traZODone (DESYREL) 50 MG tablet Take 1 tablet (50 mg total) by mouth at bedtime.   TRELEGY ELLIPTA 200-62.5-25 MCG/ACT AEPB Inhale 1 puff into the lungs daily.   No facility-administered encounter medications on file as of 01/08/2023.    Allergies as of 01/08/2023 - Review Complete 01/08/2023  Allergen Reaction Noted   Jasmine oil Shortness Of Breath and Swelling 02/12/2013   Levaquin [levofloxacin] Shortness Of Breath, Itching, Swelling,  and Rash 04/09/2019   Penicillins Anaphylaxis and Hives 02/12/2013   Aleve [naproxen sodium] Itching 02/12/2013   Ceftin [cefuroxime axetil] Other (See Comments) 02/12/2013   Chlorhexidine  04/27/2021   Depo-provera [medroxyprogesterone acetate] Other (See Comments) 02/12/2013   Doxycycline Other (See Comments) 01/14/2018   Tramadol Other (See Comments) 02/12/2013    Past Medical History:  Diagnosis Date   Allergy    seasonal allergies   Anxiety    on meds   Arthritis    hands   Asthma    childhood-current   CAP (community acquired pneumonia)  04/07/2019   Carpal tunnel syndrome    had bilateral sx to tx   Cataract    bilateral--no sx yet   COPD (chronic obstructive pulmonary disease) (HCC)    Depression    on meds   Diverticulitis    Emphysema of lung (HCC)    uses MDI   Essential hypertension, benign 07/14/2019   on meds   Hyperlipidemia    on meds   Oxygen deficiency    on 5 L cont [place]   Oxygen dependent    on 5 L cont [place]    Past Surgical History:  Procedure Laterality Date   ABDOMINAL HYSTERECTOMY  2009   APPENDECTOMY     ARTHROSCOPIC REPAIR ACL Left 2011/2014   x 2   BIOPSY  01/21/2020   Procedure: BIOPSY;  Surgeon: Napoleon Form, MD;  Location: [facility];  Service: Endoscopy;;  EGD and COLON   BUNIONECTOMY  2006   both   CARPAL TUNNEL RELEASE Bilateral 10/28/2013   Procedure: BILATERAL CARPAL TUNNEL RELEASE;  Surgeon: Nicki Reaper, MD;  Location: [facility];  Service: Orthopedics;  Laterality: Bilateral;   CHOLECYSTECTOMY  1994   CHONDROPLASTY Right 08/31/2020   Procedure: CHONDROPLASTY;  Surgeon: Frederico Hamman, MD;  Location: [facility];  Service: Orthopedics;  Laterality: Right;   COLONOSCOPY WITH PROPOFOL N/A 01/21/2020   Procedure: COLONOSCOPY WITH PROPOFOL;  Surgeon: Napoleon Form, MD;  Location: [facility];  Service: Endoscopy;  Laterality: N/A;   DILATION AND CURETTAGE OF UTERUS     ESOPHAGOGASTRODUODENOSCOPY (EGD) WITH PROPOFOL N/A 01/21/2020   Procedure: ESOPHAGOGASTRODUODENOSCOPY (EGD) WITH PROPOFOL;  Surgeon: Napoleon Form, MD;  Location: [facility];  Service: Endoscopy;  Laterality: N/A;   KNEE ARTHROSCOPY WITH ANTERIOR CRUCIATE LIGAMENT (ACL) REPAIR Right 08/31/2020   Procedure: RIGHT KNEE ARTHROSCOPY WITH ANTERIOR CRUCIATE LIGAMENT (ACL) REPAIR MEDIAL AND LATERAL  MENISECTOMY;  Surgeon: Frederico Hamman, MD;  Location: [facility];  Service: Orthopedics;  Laterality: Right;  FEMORAL NERVE BLOCK   POLYPECTOMY  01/21/2020   Procedure:  POLYPECTOMY;  Surgeon: Napoleon Form, MD;  Location: [facility];  Service: Endoscopy;;   TUBAL LIGATION  2004    Family History  Problem Relation Age of Onset   Brain cancer Mother 50   Lung cancer Mother 28   Breast cancer Sister 61   Ovarian cancer Sister 42   Colon cancer Neg Hx    Colon polyps Neg Hx    Esophageal cancer Neg Hx    Rectal cancer Neg Hx    Stomach cancer Neg Hx     Social History   Socioeconomic History   Marital status: Married    Spouse name: Deniece Portela   Number of children: 3   Years of education: 12th grade   Highest education level: Not on file  Occupational History   Occupation: Multimedia programmer: [employer]  Tobacco Use   Smoking status: Every Day    Current packs/day: 1.00    Average packs/day: 1 pack/day for 34.0 years (34.0 ttl pk-yrs)    Types: Cigarettes   Smokeless tobacco: Never   Tobacco comments:    6 cigarettes a day   Vaping Use   Vaping status: Never Used  Substance and Sexual Activity   Alcohol use: Yes    Comment: everyday   Drug use: No   Sexual activity: Yes    Birth control/protection: Surgical  Other Topics Concern   Not on file  Social History Narrative   Lives with her husband.   Their 3 children live independently.   Social Determinants of Health   Financial Resource Strain: Low Risk  (11/01/2022)   Overall Financial Resource Strain (CARDIA)    Difficulty of Paying Living Expenses: Not hard at all  Food Insecurity: No Food Insecurity (11/01/2022)   Hunger Vital Sign    Worried About Running Out of Food in the Last Year: Never true    Ran Out of Food in the Last Year: Never true  Transportation Needs: No Transportation Needs (11/01/2022)   PRAPARE - Administrator, Civil Service (Medical): No    Lack of Transportation (Non-Medical): No  Physical Activity: Inactive (11/01/2022)   Exercise Vital Sign    Days of Exercise per Week: 0 days    Minutes of Exercise per Session: 0 min   Stress: No Stress Concern Present (11/01/2022)   [employer] of Occupational Health - Occupational Stress Questionnaire    Feeling of Stress : Only a little  Social Connections: Moderately Isolated (11/01/2022)   Social Connection and Isolation Panel [NHANES]    Frequency of Communication with Friends and Family: More than three times a week    Frequency of Social Gatherings with Friends and Family: Three times a week    Attends Religious Services: Never    Active Member of Clubs or Organizations: No    Attends Banker Meetings: Never    Marital Status: Married  Catering manager Violence: Not At Risk (11/01/2022)   Humiliation, Afraid, Rape, and Kick questionnaire    Fear of Current or Ex-Partner: No    Emotionally Abused: No    Physically Abused: No    Sexually Abused: No    Review of Systems  Respiratory:  Positive for cough and shortness of breath.   Psychiatric/Behavioral:  Positive for sleep disturbance.     Vitals:   01/08/23 1334  BP: 110/70  Pulse: (!) 105  Temp: 97.7 F (36.5 C)  SpO2: 97%     Physical Exam Constitutional:      Appearance: She is obese.  HENT:     Head: Normocephalic.     Mouth/Throat:     Mouth: Mucous membranes are moist.  Eyes:     General: No scleral icterus. Cardiovascular:     Rate and Rhythm: Normal rate and regular rhythm.     Heart sounds: No murmur heard.    No friction rub.  Pulmonary:     Effort: No respiratory distress.     Breath sounds: No stridor. No wheezing or rhonchi.  Musculoskeletal:     Cervical back: No rigidity or tenderness.  Neurological:     Mental Status: She is alert.  Psychiatric:        Mood and Affect: Mood normal.    Data Reviewed: PFT from 2021 shows moderately severe obstructive disease  CT scan of the chest from  February 2024 with evidence of emphysema  Assessment:  COPD with exacerbation recently  Chronic respiratory failure  Recent  hospitalization    Plan/Recommendations: Encouraged to continue working on quitting smoking  Continue bronchodilator treatments-on Trelegy  Continue nebulizer use as needed  Rescue albuterol as needed  Graded activities as tolerated  Smoking cessation counseling   Virl Diamond MD [facility] 01/08/2023, 1:43 PM  CC: Park Meo, FNP

## 2023-01-08 NOTE — Patient Instructions (Signed)
Continue to work on quitting smoking  Continue Trelegy on a daily basis  Continue albuterol as needed  Continue oxygen and adjust levels as needed to keep your saturations right around 90  Nebulizer use as needed up to 4 times a day if needed but you may have days when you feel you do not needed  Graded activities as tolerated will help greatly  Call us with significant concerns  You want to get to a stable state before having any surgery performed

## 2023-01-30 ENCOUNTER — Other Ambulatory Visit: Payer: Self-pay | Admitting: Family Medicine

## 2023-01-30 DIAGNOSIS — G43909 Migraine, unspecified, not intractable, without status migrainosus: Secondary | ICD-10-CM

## 2023-01-31 ENCOUNTER — Other Ambulatory Visit: Payer: Self-pay

## 2023-01-31 ENCOUNTER — Telehealth: Payer: Self-pay

## 2023-01-31 NOTE — Telephone Encounter (Signed)
Pt medication (Zofran) bottle is 8mg  and in the chart it is 4mg  just prescribed by Dr. Natale Milch. She can call him to get it refill or pls advise how you want to handle this.

## 2023-02-19 ENCOUNTER — Other Ambulatory Visit: Payer: Self-pay | Admitting: Family Medicine

## 2023-02-19 ENCOUNTER — Other Ambulatory Visit: Payer: Self-pay | Admitting: Pulmonary Disease

## 2023-02-19 DIAGNOSIS — F32A Depression, unspecified: Secondary | ICD-10-CM

## 2023-02-21 ENCOUNTER — Other Ambulatory Visit: Payer: Self-pay | Admitting: Family Medicine

## 2023-02-21 DIAGNOSIS — G2581 Restless legs syndrome: Secondary | ICD-10-CM

## 2023-02-28 ENCOUNTER — Other Ambulatory Visit: Payer: Self-pay | Admitting: Family Medicine

## 2023-02-28 DIAGNOSIS — G43909 Migraine, unspecified, not intractable, without status migrainosus: Secondary | ICD-10-CM

## 2023-03-01 ENCOUNTER — Telehealth: Payer: Self-pay

## 2023-03-01 NOTE — Telephone Encounter (Signed)
PA-Citalopram 20mg  submit through Cover My Meds, however, msg from Cover Mymeds:    Send msg to pt.

## 2023-03-04 ENCOUNTER — Encounter: Payer: Self-pay | Admitting: Physician Assistant

## 2023-03-04 ENCOUNTER — Ambulatory Visit: Payer: Medicare HMO | Admitting: Physician Assistant

## 2023-03-04 VITALS — BP 104/78 | HR 115 | Ht 63.0 in | Wt 166.0 lb

## 2023-03-04 DIAGNOSIS — K439 Ventral hernia without obstruction or gangrene: Secondary | ICD-10-CM

## 2023-03-04 DIAGNOSIS — R197 Diarrhea, unspecified: Secondary | ICD-10-CM | POA: Diagnosis not present

## 2023-03-04 DIAGNOSIS — Z9981 Dependence on supplemental oxygen: Secondary | ICD-10-CM | POA: Diagnosis not present

## 2023-03-04 DIAGNOSIS — R112 Nausea with vomiting, unspecified: Secondary | ICD-10-CM | POA: Diagnosis not present

## 2023-03-04 DIAGNOSIS — R110 Nausea: Secondary | ICD-10-CM

## 2023-03-04 DIAGNOSIS — K219 Gastro-esophageal reflux disease without esophagitis: Secondary | ICD-10-CM | POA: Diagnosis not present

## 2023-03-04 DIAGNOSIS — R1110 Vomiting, unspecified: Secondary | ICD-10-CM

## 2023-03-04 MED ORDER — OMEPRAZOLE 40 MG PO CPDR: 40.0000 mg | DELAYED_RELEASE_CAPSULE | Freq: Two times a day (BID) | ORAL | 11 refills | Status: AC

## 2023-03-04 MED ORDER — ONDANSETRON HCL 4 MG PO TABS: ORAL_TABLET | ORAL | 5 refills | Status: DC

## 2023-03-04 NOTE — Patient Instructions (Signed)
We have sent the following medications to your pharmacy for you to pick up at your convenience: Omeprazole ,zofran  You have been scheduled for an appointment with _______________ at [facility]. Your appointment is on ______________ at _________________. Please arrive at ________________ for registration. Make certain to bring a list of current medications, including any over the counter medications or vitamins. Also bring your co-pay if you have one as well as your insurance cards. [facility] is located at [address], [address]. Should you need to reschedule your appointment, please contact them at [phone].   I appreciate the opportunity to care for you. Hyacinth Meeker, PA-C

## 2023-03-04 NOTE — Progress Notes (Signed)
Chief Complaint: Follow up hospitalization diverticulitis  HPI:    Vicki [name] is a 57 year old female with a past medical history as listed below including COPD and emphysema on oxygen at 5 L, known to Dr. Lavon Paganini, who was referred to me by Park Meo, FNP for follow-up after hospitalization for diverticulitis.    12/25/2019 gastric emptying study was normal.    01/21/2020 EGD and colonoscopy.  Colonoscopy with one 8 mm polyp in the transverse colon, mild diverticulosis in the sigmoid, descending, transverse and ascending colon as well as congested inferred mucosa in the rectum with nonbleeding internal hemorrhoids.  Biopsy showed tubular adenoma.  Current recommendations are for repeat in 7 to 10 years.  EGD done for nausea and vomiting with LA grade B reflux esophagitis and gastritis.  Patient started on Omeprazole 20 mg daily.  Biopsy showed reactive gastropathy.    12/22/2022-12/27/2022 patient admitted to the hospital for acute diverticulitis.  Had presented with worsening abdominal pain and nausea and vomiting with a CT scan showing mild diverticulitis.  Patient was on IV Rocephin and Flagyl and transition to Cefdinir/Flagyl to complete her course.  C. difficile and stool GI PCR were negative.    Today, patient tells me unfortunately she continues with GI issues.  She has chronic nausea and vomiting at this point over the past 3 years and this is really unchanged, she will have bad days and good ones, typically anything that she eats will try to come back out so she tries to eat just small meals.  She does have Metoclopramide on her medication list but I cannot see when this was started and she tells me she is not taking it anymore.  She does use Zofran 4 mg every night and occasionally will need another couple through the day but has not actually scheduled this out before.  She is on Omeprazole but just 20 mg daily.  Does tell me that when she is vomiting she has issues with 2 abdominal wall  hernias which are painful during those times and would like to see a surgical team about this.    Also discusses ongoing diarrhea, apparently this occurs off and on but over the past 48 hours she has had too numerous to count stools and ended up using 6 Imodium's over the past 2 days and has had no further bowel movements at this moment, also with a generalized abdominal discomfort.    Denies fever, chills or blood in her stool.  Past Medical History:  Diagnosis Date   Allergy    seasonal allergies   Anxiety    on meds   Arthritis    hands   Asthma    childhood-current   CAP (community acquired pneumonia) 04/07/2019   Carpal tunnel syndrome    had bilateral sx to tx   Cataract    bilateral--no sx yet   COPD (chronic obstructive pulmonary disease) (HCC)    Depression    on meds   Diverticulitis    Emphysema of lung (HCC)    uses MDI   Essential hypertension, benign 07/14/2019   on meds   Hyperlipidemia    on meds   Oxygen deficiency    on 5 L cont [place]   Oxygen dependent    on 5 L cont [place]    Past Surgical History:  Procedure Laterality Date   ABDOMINAL HYSTERECTOMY  2009   APPENDECTOMY     ARTHROSCOPIC REPAIR ACL Left 2011/2014   x 2  BIOPSY  01/21/2020   Procedure: BIOPSY;  Surgeon: Napoleon Form, MD;  Location: [facility];  Service: Endoscopy;;  EGD and COLON   BUNIONECTOMY  2006   both   CARPAL TUNNEL RELEASE Bilateral 10/28/2013   Procedure: BILATERAL CARPAL TUNNEL RELEASE;  Surgeon: Nicki Reaper, MD;  Location: [facility];  Service: Orthopedics;  Laterality: Bilateral;   CHOLECYSTECTOMY  1994   CHONDROPLASTY Right 08/31/2020   Procedure: CHONDROPLASTY;  Surgeon: Frederico Hamman, MD;  Location: [facility];  Service: Orthopedics;  Laterality: Right;   COLONOSCOPY WITH PROPOFOL N/A 01/21/2020   Procedure: COLONOSCOPY WITH PROPOFOL;  Surgeon: Napoleon Form, MD;  Location: [facility];  Service: Endoscopy;  Laterality: N/A;    DILATION AND CURETTAGE OF UTERUS     ESOPHAGOGASTRODUODENOSCOPY (EGD) WITH PROPOFOL N/A 01/21/2020   Procedure: ESOPHAGOGASTRODUODENOSCOPY (EGD) WITH PROPOFOL;  Surgeon: Napoleon Form, MD;  Location: [facility];  Service: Endoscopy;  Laterality: N/A;   KNEE ARTHROSCOPY WITH ANTERIOR CRUCIATE LIGAMENT (ACL) REPAIR Right 08/31/2020   Procedure: RIGHT KNEE ARTHROSCOPY WITH ANTERIOR CRUCIATE LIGAMENT (ACL) REPAIR MEDIAL AND LATERAL  MENISECTOMY;  Surgeon: Frederico Hamman, MD;  Location: [facility];  Service: Orthopedics;  Laterality: Right;  FEMORAL NERVE BLOCK   POLYPECTOMY  01/21/2020   Procedure: POLYPECTOMY;  Surgeon: Napoleon Form, MD;  Location: [facility];  Service: Endoscopy;;   TUBAL LIGATION  2004    Current Outpatient Medications  Medication Sig Dispense Refill   acetaminophen (TYLENOL) 500 MG tablet Take 500 mg by mouth every 6 (six) hours as needed for moderate pain.     albuterol (VENTOLIN HFA) 108 (90 Base) MCG/ACT inhaler INHALE 2 PUFFS INTO THE LUNGS TWICE A DAY (Patient taking differently: Inhale 2 puffs into the lungs every 4 (four) hours.) 18 each 11   ALPRAZolam (XANAX) 1 MG tablet TAKE 1 TABLET BY MOUTH THREE TIMES A DAY AS NEEDED FOR ANXIETY STRENGTH: 1 MG 90 tablet 0   amLODipine (NORVASC) 5 MG tablet Take 1 tablet (5 mg total) by mouth daily. 90 tablet 1   ASPIRIN LOW DOSE 81 MG EC tablet TAKE 1 TABLET BY MOUTH 2 TIMES DAILY. (Patient taking differently: Take 81 mg by mouth in the morning and at bedtime.) 180 tablet 1   azelastine (ASTELIN) 0.1 % nasal spray Place 1 spray into both nostrils 2 (two) times daily. Use in each nostril as directed (Patient taking differently: Place 1 spray into both nostrils daily.) 30 mL 12   citalopram (CELEXA) 20 MG tablet TAKE 3 TABLETS (60 MG) DAILY 90 tablet 2   EPINEPHrine (EPIPEN 2-PAK) 0.3 mg/0.3 mL IJ SOAJ injection Inject 0.3 mg into the muscle as needed for anaphylaxis. 1 each 3   Fluticasone-Umeclidin-Vilant  (TRELEGY ELLIPTA) 200-62.5-25 MCG/ACT AEPB TAKE 1 PUFF BY MOUTH EVERY DAY 60 each 11   hydrOXYzine (ATARAX) 25 MG tablet TAKE 0.5-1 TABLETS (12.5-25 MG TOTAL) BY MOUTH 3 (THREE) TIMES DAILY AS NEEDED FOR ANXIETY. 270 tablet 2   ipratropium-albuterol (DUONEB) 0.5-2.5 (3) MG/3ML SOLN Use 1 vial by nebulization in the morning and at bedtime. 360 mL 0   Magnesium Citrate 100 MG TABS Take 1 tablet by mouth at bedtime. (Patient taking differently: Take 100 mg by mouth at bedtime.) 90 tablet 1   meclizine (ANTIVERT) 25 MG tablet Take 1 tablet (25 mg total) by mouth 3 (three) times daily as needed for dizziness. 180 tablet 1   metoCLOPramide (REGLAN) 10 MG tablet Take 1 tablet (10 mg  total) by mouth 3 (three) times daily with meals. 90 tablet 0   omeprazole (PRILOSEC) 20 MG capsule Take 1 capsule (20 mg total) by mouth daily. 90 capsule 3   ondansetron (ZOFRAN) 4 MG tablet Take 1 tablet (4 mg total) by mouth daily as needed for nausea or vomiting. 30 tablet 1   potassium chloride SA (KLOR-CON M) 20 MEQ tablet Take 1 tablet (20 mEq total) by mouth daily. 90 tablet 3   rizatriptan (MAXALT) 10 MG tablet TAKE 1 TABLET BY MOUTH AS NEEDED FOR MIGRAINE. MAY REPEAT IN 2 HOURS IF NEEDED STRENGTH: 10 MG 10 tablet 0   rOPINIRole (REQUIP) 2 MG tablet TAKE 1/2 TO 1 TABLETS BY MOUTH AT BEDTIME. 90 tablet 3   traZODone (DESYREL) 50 MG tablet Take 1 tablet (50 mg total) by mouth at bedtime. 90 tablet 3   No current facility-administered medications for this visit.    Allergies as of 03/04/2023 - Review Complete 01/08/2023  Allergen Reaction Noted   Jasmine oil Shortness Of Breath and Swelling 02/12/2013   Levaquin [levofloxacin] Shortness Of Breath, Itching, Swelling, and Rash 04/09/2019   Penicillins Anaphylaxis and Hives 02/12/2013   Aleve [naproxen sodium] Itching 02/12/2013   Ceftin [cefuroxime axetil] Other (See Comments) 02/12/2013   Chlorhexidine  04/27/2021   Depo-provera [medroxyprogesterone acetate] Other  (See Comments) 02/12/2013   Doxycycline Other (See Comments) 01/14/2018   Tramadol Other (See Comments) 02/12/2013    Family History  Problem Relation Age of Onset   Brain cancer Mother 66   Lung cancer Mother 37   Breast cancer Sister 59   Ovarian cancer Sister 62   Colon cancer Neg Hx    Colon polyps Neg Hx    Esophageal cancer Neg Hx    Rectal cancer Neg Hx    Stomach cancer Neg Hx     Social History   Socioeconomic History   Marital status: Married    Spouse name: Deniece Portela   Number of children: 3   Years of education: 12th grade   Highest education level: Not on file  Occupational History   Occupation: Multimedia programmer: [employer]  Tobacco Use   Smoking status: Every Day    Current packs/day: 1.00    Average packs/day: 1 pack/day for 34.0 years (34.0 ttl pk-yrs)    Types: Cigarettes   Smokeless tobacco: Never   Tobacco comments:    6 cigarettes a day   Vaping Use   Vaping status: Never Used  Substance and Sexual Activity   Alcohol use: Yes    Comment: everyday   Drug use: No   Sexual activity: Yes    Birth control/protection: Surgical  Other Topics Concern   Not on file  Social History Narrative   Lives with her husband.   Their 3 children live independently.   Social Determinants of Health   Financial Resource Strain: Low Risk  (11/01/2022)   Overall Financial Resource Strain (CARDIA)    Difficulty of Paying Living Expenses: Not hard at all  Food Insecurity: No Food Insecurity (11/01/2022)   Hunger Vital Sign    Worried About Running Out of Food in the Last Year: Never true    Ran Out of Food in the Last Year: Never true  Transportation Needs: No Transportation Needs (11/01/2022)   PRAPARE - Administrator, Civil Service (Medical): No    Lack of Transportation (Non-Medical): No  Physical Activity: Inactive (11/01/2022)   Exercise Vital Sign  Days of Exercise per Week: 0 days    Minutes of Exercise per Session: 0 min  Stress:  No Stress Concern Present (11/01/2022)   [employer] of Occupational Health - Occupational Stress Questionnaire    Feeling of Stress : Only a little  Social Connections: Moderately Isolated (11/01/2022)   Social Connection and Isolation Panel [NHANES]    Frequency of Communication with Friends and Family: More than three times a week    Frequency of Social Gatherings with Friends and Family: Three times a week    Attends Religious Services: Never    Active Member of Clubs or Organizations: No    Attends Banker Meetings: Never    Marital Status: Married  Catering manager Violence: Not At Risk (11/01/2022)   Humiliation, Afraid, Rape, and Kick questionnaire    Fear of Current or Ex-Partner: No    Emotionally Abused: No    Physically Abused: No    Sexually Abused: No    Review of Systems:    Constitutional: No weight loss, fever or chills Cardiovascular: No chest pain  Respiratory: No SOB  Gastrointestinal: See HPI and otherwise negative Genitourinary: No dysuria Neurological: No headache, dizziness or syncope Musculoskeletal: No new muscle or joint pain Hematologic: No bleeding  Psychiatric: No history of depression or anxiety   Physical Exam:  Vital signs: BP 104/78   Pulse (!) 115   Ht 5\' 3"  (1.6 m)   Wt 166 lb (75.3 kg)   BMI 29.41 kg/m    Constitutional:   Pleasant chronically ill appearing Caucasian female appears to be in NAD, Well developed, Well nourished, alert and cooperative Head:  Normocephalic and atraumatic. Eyes:   PEERL, EOMI. No icterus. Conjunctiva pink. Ears:  Normal auditory acuity. Neck:  Supple Throat: Oral cavity and pharynx without inflammation, swelling or lesion.  Respiratory: Respirations even and unlabored. Lungs clear to auscultation bilaterally.   No wheezes, crackles, or rhonchi. +on O2 at 5L via [place] Cardiovascular: Normal S1, S2. No MRG. Regular rate and rhythm. No peripheral edema, cyanosis or pallor.  Gastrointestinal:   Soft, nondistended, mild generalized ttp, No rebound or guarding. Normal bowel sounds. No appreciable masses or hepatomegaly. Rectal:  Not performed.  Msk:  Symmetrical without gross deformities. Without edema, no deformity or joint abnormality.  Neurologic:  Alert and  oriented x4;  grossly normal neurologically.  Skin:   Dry and intact without significant lesions or rashes. Psychiatric: Demonstrates good judgement and reason without abnormal affect or behaviors.  RELEVANT LABS AND IMAGING: CBC    Component Value Date/Time   WBC 6.1 12/26/2022 0134   RBC 3.58 (L) 12/26/2022 0134   HGB 11.2 (L) 12/26/2022 0134   HGB 15.4 10/07/2020 0925   HCT 34.8 (L) 12/26/2022 0134   HCT 46.4 10/07/2020 0925   PLT 144 (L) 12/26/2022 0134   PLT 149 (L) 08/17/2019 1238   MCV 97.2 12/26/2022 0134   MCV 95 10/07/2020 0925   MCH 31.3 12/26/2022 0134   MCHC 32.2 12/26/2022 0134   RDW 11.9 12/26/2022 0134   RDW 12.5 10/07/2020 0925   LYMPHSABS 1.6 12/26/2022 0134   LYMPHSABS 5.0 (H) 10/07/2020 0925   MONOABS 0.5 12/26/2022 0134   EOSABS 0.8 (H) 12/26/2022 0134   EOSABS 0.1 10/07/2020 0925   BASOSABS 0.0 12/26/2022 0134   BASOSABS 0.0 10/07/2020 0925    CMP     Component Value Date/Time   NA 135 12/26/2022 0134   NA 140 10/07/2020 0925   K 3.6 12/26/2022  0134   CL 101 12/26/2022 0134   CO2 24 12/26/2022 0134   GLUCOSE 128 (H) 12/26/2022 0134   BUN 9 12/26/2022 0134   BUN 12 10/07/2020 0925   CREATININE 0.92 12/26/2022 0134   CREATININE 1.14 (H) 09/26/2022 1605   CALCIUM 8.1 (L) 12/26/2022 0134   PROT 5.6 (L) 12/25/2022 0158   PROT 7.0 10/07/2020 0925   ALBUMIN 3.1 (L) 12/25/2022 0158   ALBUMIN 4.3 10/07/2020 0925   AST 59 (H) 12/25/2022 0158   ALT 21 12/25/2022 0158   ALKPHOS 90 12/25/2022 0158   BILITOT 0.8 12/25/2022 0158   BILITOT 0.2 10/07/2020 0925   GFRNONAA >60 12/26/2022 0134   GFRAA 81 08/17/2019 1238    Assessment: 1.  Chronic nausea and vomiting: Previous EGD and  colonoscopy in 2021 with gastritis, patient continues with symptoms, prior gastric emptying study normal; consider relation to oxygen and polypharmacy versus gastritis versus other 2.  Diarrhea: Apparently has been admitted 5+ times for ongoing C. difficile and colitis, did have diarrhea over the past 48 hours which is stopped now with the use of Imodium; consider ongoing infectious cause versus other 3.  Abdominal wall hernia: Painful and patient has vomiting episodes 4.  Chronic O2 use  Plan: 1.  Patient would like referral to the surgical team to discuss her abdominal wall hernias.  Did explain that they likely would not want her to have surgery for these given her chronic O2 status and continued chronic vomiting symptoms.  She would still like to discuss this with them.  Went ahead and sent referral. 2.  Increased Omeprazole to 40 mg twice daily, 30-60 minutes before breakfast and dinner.  #60 with 5 refills. 3.  Discussed use of Zofran, she can use 4 mg, 1-2 tabs q. 4-6 hours.  Could also try scheduling this in the future or taking it preemptively.  Prescribed #60 with 5 refills. 4.  Discussed with patient that if the Omeprazole increase does not help with her nausea and vomiting then we could trial Phenergan suppositories that she get have on hand for breakthrough symptoms.  Would prescribe 25 mg every 8 hours. 5.  Discussed diarrhea, currently this is not an issue at the moment, but if she continues with diarrheal stools would recommend we repeat a GI pathogen panel.  She will call and let us know. 6.  Patient to follow in clinic in the next 3 months with Dr. Lavon Paganini or myself.  Hyacinth Meeker, PA-C [facility] 03/04/2023, 9:40 AM  Cc: Park Meo, FNP

## 2023-03-13 ENCOUNTER — Other Ambulatory Visit: Payer: Self-pay | Admitting: Family Medicine

## 2023-03-13 DIAGNOSIS — G43909 Migraine, unspecified, not intractable, without status migrainosus: Secondary | ICD-10-CM

## 2023-03-18 ENCOUNTER — Telehealth: Payer: Self-pay

## 2023-03-18 NOTE — Telephone Encounter (Signed)
Pt has called requesting a refill on her Alprazolam 1 mg tablet. Last RF was 12/31/2022. Thanks.

## 2023-03-19 ENCOUNTER — Other Ambulatory Visit: Payer: Self-pay | Admitting: Family Medicine

## 2023-03-19 DIAGNOSIS — F32A Depression, unspecified: Secondary | ICD-10-CM

## 2023-03-19 MED ORDER — ALPRAZOLAM 1 MG PO TABS
ORAL_TABLET | ORAL | 0 refills | Status: DC
Start: 2023-03-19 — End: 2023-04-23

## 2023-03-22 ENCOUNTER — Other Ambulatory Visit: Payer: Self-pay

## 2023-03-22 ENCOUNTER — Telehealth: Payer: Self-pay

## 2023-03-22 DIAGNOSIS — I10 Essential (primary) hypertension: Secondary | ICD-10-CM

## 2023-03-22 MED ORDER — AMLODIPINE BESYLATE 5 MG PO TABS
5.0000 mg | ORAL_TABLET | Freq: Every day | ORAL | 1 refills | Status: DC
Start: 2023-03-22 — End: 2023-10-19

## 2023-03-22 NOTE — Telephone Encounter (Signed)
Prescription Request  03/22/2023  LOV: 10/29/22  What is the name of the medication or equipment? amLODipine (NORVASC) 5 MG tablet [[id number]]  Have you contacted your pharmacy to request a refill? Yes   Which pharmacy would you like this sent to?  [id number] [place], [place] - [address] [place] [address] Phone: [phone] Fax: [phone]    Patient notified that their request is being sent to the clinical staff for review and that they should receive a response within 2 business days.   Please advise at [facility] [phone]

## 2023-04-03 ENCOUNTER — Encounter (HOSPITAL_BASED_OUTPATIENT_CLINIC_OR_DEPARTMENT_OTHER): Payer: Medicare HMO

## 2023-04-03 ENCOUNTER — Encounter (HOSPITAL_BASED_OUTPATIENT_CLINIC_OR_DEPARTMENT_OTHER): Payer: Self-pay | Admitting: Pulmonary Disease

## 2023-04-03 ENCOUNTER — Ambulatory Visit: Payer: Medicare HMO | Admitting: Pulmonary Disease

## 2023-04-04 ENCOUNTER — Telehealth: Payer: Self-pay

## 2023-04-04 ENCOUNTER — Ambulatory Visit: Payer: Self-pay | Admitting: Family Medicine

## 2023-04-04 NOTE — Telephone Encounter (Signed)
Vicki [name] (Vicki [name])  Your information has been sent to [facility].

## 2023-04-04 NOTE — Telephone Encounter (Addendum)
Copied from CRM [id number]. Topic: Clinical - Red Word Triage >> Apr 04, 2023  2:42 PM Vicki [name] wrote: Red Word that prompted transfer to Nurse Triage: Depression/medication  Chief Complaint: depression and need medication Symptoms: sadness and crying Frequency: comes and goes Pertinent Negatives: Patient denies suicidal ideation or plan Disposition: [] ED /[] Urgent Care (no appt availability in office) / [] Appointment(In office/virtual)/ []  [place] [facility]/ [x] Home Care/ [] Refused Recommended Disposition /[] [facility]/ []  Follow-up with PCP Additional Notes: pt c/o depression that started recently after death of brother Mar 16, 2023 and dog 2023-04-25 and now other brother diagnosed with cancer & now caring for husband father.  Stopped taking depression medication 1 year ago and now need to go back on it. No plan to harm self or other and no plan.  States depression not bad but need medication.  No appts availabe with office soon with PCP or other providers: would like to be worked in asap with PCp.  Home care advice given by nurse and nurse also advice to go to ED if s/s become worse  Reason for Disposition  [1] Depression AND [2] NO worsening or change  [1] Depression AND [2] worsening (e.g., sleeping poorly, less able to do activities of daily living)  Recent death of a loved one  Answer Assessment - Initial Assessment Questions 1. CONCERN: "What happened that made you call today?"     Need to speak with provider to start back taking depression medication - been off depression medication for 1 year 2. DEPRESSION SYMPTOM SCREENING: "How are you feeling overall?" (e.g., decreased energy, increased sleeping or difficulty sleeping, difficulty concentrating, feelings of sadness, guilt, hopelessness, or worthlessness)     Sad due to brother died 10/4/204 of cancer, dog died 2023-04-25, other brother notified of cancer and currently taking care of husband father because he has  dementia 3. RISK OF HARM - SUICIDAL IDEATION:  "Do you ever have thoughts of hurting or killing yourself?"  (e.g., yes, no, no but preoccupation with thoughts about death) no thoughts of hurting or killngs self at present time   - INTENT:  "Do you have thoughts of hurting or killing yourself right NOW?" (e.g., yes, no, N/A)   - PLAN: "Do you have a specific plan for how you would do this?" (e.g., gun, knife, overdose, no plan, N/A)     No plan 4. RISK OF HARM - HOMICIDAL IDEATION:  "Do you ever have thoughts of hurting or killing someone else?"  (e.g., yes, no, no but preoccupation with thoughts about death)   - INTENT:  "Do you have thoughts of hurting or killing someone right NOW?" (e.g., yes, no, N/A)   - PLAN: "Do you have a specific plan for how you would do this?" (e.g., gun, knife, no plan, N/A)      Patient states she is not that depressed at present time and does not want to get that depressed that is why she is calling 5. FUNCTIONAL IMPAIRMENT: "How have things been going for you overall? Have you had more difficulty than usual doing your normal daily activities?"  (e.g., better, same, worse; self-care, school, work, interactions)     N/a 6. SUPPORT: "Who is with you now?" "Who do you live with?" "Do you have family or friends who you can talk to?"      Husband -support system at home 7. THERAPIST: "Do you have a counselor or therapist? Name?"     N/a 8. STRESSORS: "Has there been any new  stress or recent changes in your life?"     Family  and dog death 28. ALCOHOL USE OR SUBSTANCE USE (DRUG USE): "Do you drink alcohol or use any illegal drugs?"     N/a 10. OTHER: "Do you have any other physical symptoms right now?" (e.g., fever)       no 11. PREGNANCY: "Is there any chance you are pregnant?" "When was your last menstrual period?"       no  Protocols used: Depression-A-AH

## 2023-04-08 ENCOUNTER — Telehealth: Payer: Self-pay

## 2023-04-08 NOTE — Telephone Encounter (Signed)
Pt has been scheduled with Amber.  Copied from CRM [id number]. Topic: Appointments - Appointment Scheduling >> Apr 04, 2023  4:43 PM Tiffany H wrote: Patient's husband called to advise that patient needs to resume her depression medication. Patient's husband Deniece Portela reports that patient has no desire to do anything. Won't eat regularly. Patient's husband advised that she has anxiety. Please assist.

## 2023-04-09 ENCOUNTER — Telehealth: Payer: Self-pay

## 2023-04-09 NOTE — Telephone Encounter (Signed)
Rece' fax from [facility] for approval of pt meds citalopram BFR 30mg  Caps.  However, Rx is written for 20mg ? Pls advise and clarify dose?

## 2023-04-10 ENCOUNTER — Other Ambulatory Visit: Payer: Self-pay | Admitting: Family Medicine

## 2023-04-10 ENCOUNTER — Other Ambulatory Visit: Payer: Self-pay

## 2023-04-10 MED ORDER — CITALOPRAM HYDROBROMIDE 20 MG PO TABS: 20.0000 mg | ORAL_TABLET | Freq: Every day | ORAL | 2 refills | Status: DC

## 2023-04-10 NOTE — Addendum Note (Signed)
Addended by: Arta Silence on: 04/10/2023 12:40 PM   Modules accepted: Orders

## 2023-04-15 ENCOUNTER — Other Ambulatory Visit: Payer: Self-pay | Admitting: Family Medicine

## 2023-04-15 MED ORDER — CITALOPRAM HYDROBROMIDE 20 MG PO TABS: 20.0000 mg | ORAL_TABLET | Freq: Every day | ORAL | 0 refills | Status: DC

## 2023-04-21 ENCOUNTER — Other Ambulatory Visit: Payer: Self-pay | Admitting: Family Medicine

## 2023-04-21 DIAGNOSIS — G43909 Migraine, unspecified, not intractable, without status migrainosus: Secondary | ICD-10-CM

## 2023-04-23 ENCOUNTER — Ambulatory Visit: Payer: Medicare HMO | Admitting: Family Medicine

## 2023-04-23 ENCOUNTER — Encounter: Payer: Self-pay | Admitting: Family Medicine

## 2023-04-23 VITALS — BP 98/70 | HR 98 | Temp 97.6°F | Ht 63.0 in | Wt 166.0 lb

## 2023-04-23 DIAGNOSIS — F32A Depression, unspecified: Secondary | ICD-10-CM | POA: Diagnosis not present

## 2023-04-23 DIAGNOSIS — I10 Essential (primary) hypertension: Secondary | ICD-10-CM | POA: Diagnosis not present

## 2023-04-23 MED ORDER — CITALOPRAM HYDROBROMIDE 40 MG PO TABS: 40.0000 mg | ORAL_TABLET | Freq: Every day | ORAL | 1 refills | Status: DC

## 2023-04-23 MED ORDER — ALPRAZOLAM 1 MG PO TABS
1.0000 mg | ORAL_TABLET | Freq: Two times a day (BID) | ORAL | 0 refills | Status: DC | PRN
Start: 2023-04-23 — End: 2023-04-25

## 2023-04-23 NOTE — Assessment & Plan Note (Addendum)
Uncontrolled with recent stressors. Continue Citalopram 20mg  daily, take consistently, if tolerating can increase to 40mg  daily. Follow up in office in 4 weeks or sooner if needed. Refill provided for Xanax, she is taking 1-2 times daily.

## 2023-04-23 NOTE — Assessment & Plan Note (Signed)
BP 98/70 today and in 100s at home. Will hold Amlodipine and follow up in 1.5 weeks at next OV in setting of recent dizziness.

## 2023-04-23 NOTE — Progress Notes (Signed)
Subjective:  HPI: Vicki [name] is a 57 y.o. female presenting on 04/23/2023 for Follow-up (discussion about resuming depression medication)   HPI Patient is in today for uncontrolled depression due to recent stressors. She was previously taking Citalopram and had trouble getting a refill. She was previously taking 60mg  daily and well controlled. She is currently taking 20mg  daily since December 2 but has not been taking consistently. She cannot take Buspar. She does not see psychiatry. Denies SI/HI.  Review of Systems  Cardiovascular: Negative.   Neurological:  Positive for dizziness.  Psychiatric/Behavioral:  Positive for depression.   All other systems reviewed and are negative.   Relevant past medical history reviewed and updated as indicated.   Past Medical History:  Diagnosis Date   Allergy    seasonal allergies   Anxiety    on meds   Arthritis    hands   Asthma    childhood-current   CAP (community acquired pneumonia) 04/07/2019   Carpal tunnel syndrome    had bilateral sx to tx   Cataract    bilateral--no sx yet   COPD (chronic obstructive pulmonary disease) (HCC)    Depression    on meds   Diverticulitis    Emphysema of lung (HCC)    uses MDI   Essential hypertension, benign 07/14/2019   on meds   Hyperlipidemia    on meds   Oxygen deficiency    on 5 L cont [facility]   Oxygen dependent    on 5 L cont      Past Surgical History:  Procedure Laterality Date   ABDOMINAL HYSTERECTOMY  2009   APPENDECTOMY     ARTHROSCOPIC REPAIR ACL Left 2011/2014   x 2   BIOPSY  01/21/2020   Procedure: BIOPSY;  Surgeon: Napoleon Form, MD;  Location: [facility];  Service: Endoscopy;;  EGD and COLON   BUNIONECTOMY  2006   both   CARPAL TUNNEL RELEASE Bilateral 10/28/2013   Procedure: BILATERAL CARPAL TUNNEL RELEASE;  Surgeon: Nicki Reaper, MD;  Location: [facility];  Service: Orthopedics;  Laterality: Bilateral;   CHOLECYSTECTOMY  1994    CHONDROPLASTY Right 08/31/2020   Procedure: CHONDROPLASTY;  Surgeon: Frederico Hamman, MD;  Location: [facility];  Service: Orthopedics;  Laterality: Right;   COLONOSCOPY WITH PROPOFOL N/A 01/21/2020   Procedure: COLONOSCOPY WITH PROPOFOL;  Surgeon: Napoleon Form, MD;  Location: [facility];  Service: Endoscopy;  Laterality: N/A;   DILATION AND CURETTAGE OF UTERUS     ESOPHAGOGASTRODUODENOSCOPY (EGD) WITH PROPOFOL N/A 01/21/2020   Procedure: ESOPHAGOGASTRODUODENOSCOPY (EGD) WITH PROPOFOL;  Surgeon: Napoleon Form, MD;  Location: [facility];  Service: Endoscopy;  Laterality: N/A;   KNEE ARTHROSCOPY WITH ANTERIOR CRUCIATE LIGAMENT (ACL) REPAIR Right 08/31/2020   Procedure: RIGHT KNEE ARTHROSCOPY WITH ANTERIOR CRUCIATE LIGAMENT (ACL) REPAIR MEDIAL AND LATERAL  MENISECTOMY;  Surgeon: Frederico Hamman, MD;  Location: [facility];  Service: Orthopedics;  Laterality: Right;  FEMORAL NERVE BLOCK   POLYPECTOMY  01/21/2020   Procedure: POLYPECTOMY;  Surgeon: Napoleon Form, MD;  Location: [facility];  Service: Endoscopy;;   TUBAL LIGATION  2004    Allergies and medications reviewed and updated.   Current Outpatient Medications:    acetaminophen (TYLENOL) 500 MG tablet, Take 500 mg by mouth every 6 (six) hours as needed for moderate pain., Disp: , Rfl:    albuterol (VENTOLIN HFA) 108 (90 Base) MCG/ACT inhaler, INHALE 2 PUFFS INTO THE LUNGS TWICE A DAY (  Patient taking differently: Inhale 2 puffs into the lungs every 4 (four) hours.), Disp: 18 each, Rfl: 11   amLODipine (NORVASC) 5 MG tablet, Take 1 tablet (5 mg total) by mouth daily., Disp: 90 tablet, Rfl: 1   ASPIRIN LOW DOSE 81 MG EC tablet, TAKE 1 TABLET BY MOUTH 2 TIMES DAILY. (Patient taking differently: Take 81 mg by mouth in the morning and at bedtime.), Disp: 180 tablet, Rfl: 1   azelastine (ASTELIN) 0.1 % nasal spray, Place 1 spray into both nostrils 2 (two) times daily. Use in each nostril as directed  (Patient taking differently: Place 1 spray into both nostrils daily.), Disp: 30 mL, Rfl: 12   EPINEPHrine (EPIPEN 2-PAK) 0.3 mg/0.3 mL IJ SOAJ injection, Inject 0.3 mg into the muscle as needed for anaphylaxis., Disp: 1 each, Rfl: 3   Fluticasone-Umeclidin-Vilant (TRELEGY ELLIPTA) 200-62.5-25 MCG/ACT AEPB, TAKE 1 PUFF BY MOUTH EVERY DAY, Disp: 60 each, Rfl: 11   hydrOXYzine (ATARAX) 25 MG tablet, TAKE 0.5-1 TABLETS (12.5-25 MG TOTAL) BY MOUTH 3 (THREE) TIMES DAILY AS NEEDED FOR ANXIETY., Disp: 270 tablet, Rfl: 2   ipratropium-albuterol (DUONEB) 0.5-2.5 (3) MG/3ML SOLN, Use 1 vial by nebulization in the morning and at bedtime., Disp: 360 mL, Rfl: 0   Magnesium Citrate 100 MG TABS, Take 1 tablet by mouth at bedtime. (Patient taking differently: Take 100 mg by mouth at bedtime.), Disp: 90 tablet, Rfl: 1   meclizine (ANTIVERT) 25 MG tablet, Take 1 tablet (25 mg total) by mouth 3 (three) times daily as needed for dizziness., Disp: 180 tablet, Rfl: 1   omeprazole (PRILOSEC) 40 MG capsule, Take 1 capsule (40 mg total) by mouth 2 (two) times daily. Take 30 mins. Prior to breakfast and supper, Disp: 60 capsule, Rfl: 11   ondansetron (ZOFRAN) 4 MG tablet, Take one tablet by mouth every 4-6 hours as needed, Disp: 60 tablet, Rfl: 5   potassium chloride SA (KLOR-CON M) 20 MEQ tablet, Take 1 tablet (20 mEq total) by mouth daily., Disp: 90 tablet, Rfl: 3   rizatriptan (MAXALT) 10 MG tablet, TAKE 1 TABLET BY MOUTH AS NEEDED FOR MIGRAINE. MAY REPEAT IN 2 HOURS IF NEEDED STRENGTH: 10 MG, Disp: 10 tablet, Rfl: 0   rOPINIRole (REQUIP) 2 MG tablet, TAKE 1/2 TO 1 TABLETS BY MOUTH AT BEDTIME., Disp: 90 tablet, Rfl: 3   traZODone (DESYREL) 50 MG tablet, Take 1 tablet (50 mg total) by mouth at bedtime., Disp: 90 tablet, Rfl: 3   ALPRAZolam (XANAX) 1 MG tablet, Take 1 tablet (1 mg total) by mouth 2 (two) times daily as needed for anxiety. TAKE 1 TABLET BY MOUTH THREE TIMES A DAY AS NEEDED FOR ANXIETY Strength: 1 mg, Disp: 30  tablet, Rfl: 0   citalopram (CELEXA) 40 MG tablet, Take 1 tablet (40 mg total) by mouth daily., Disp: 90 tablet, Rfl: 1   metoCLOPramide (REGLAN) 10 MG tablet, Take 1 tablet (10 mg total) by mouth 3 (three) times daily with meals., Disp: 90 tablet, Rfl: 0  Allergies  Allergen Reactions   Jasmine Oil Shortness Of Breath and Swelling   Levaquin [Levofloxacin] Shortness Of Breath, Itching, Swelling and Rash   Penicillins Anaphylaxis and Hives    Tolerated ceftriaxone   Aleve [Naproxen Sodium] Itching   Ceftin [Cefuroxime Axetil] Other (See Comments)    unsure   Chlorhexidine    Depo-Provera [Medroxyprogesterone Acetate] Other (See Comments)    Severe acne   Doxycycline Other (See Comments)    Raises blood pressure  Tramadol Other (See Comments)    Kept her up all night    Objective:   BP 98/70   Pulse 98   Temp 97.6 F (36.4 C) (Oral)   Ht 5\' 3"  (1.6 m)   Wt 166 lb (75.3 kg)   SpO2 93% Comment: 5L  BMI 29.41 kg/m      04/23/2023    4:07 PM 03/04/2023    9:39 AM 01/08/2023    1:34 PM  Vitals with BMI  Height 5\' 3"  5\' 3"  5\' 3"   Weight 166 lbs 166 lbs 176 lbs 10 oz  BMI 29.41 29.41 31.29  Systolic 98 104 110  Diastolic 70 78 70  Pulse 98 115 105     Physical Exam Vitals and nursing note reviewed.  Constitutional:      Appearance: Normal appearance. She is normal weight.  HENT:     Head: Normocephalic and atraumatic.  Cardiovascular:     Rate and Rhythm: Normal rate and regular rhythm.     Pulses: Normal pulses.     Heart sounds: Normal heart sounds.  Pulmonary:     Effort: Pulmonary effort is normal.     Breath sounds: Normal breath sounds.  Skin:    General: Skin is warm and dry.  Neurological:     General: No focal deficit present.     Mental Status: She is alert and oriented to person, place, and time. Mental status is at baseline.  Psychiatric:        Mood and Affect: Mood normal.        Behavior: Behavior normal.        Thought Content: Thought  content normal.        Judgment: Judgment normal.     Assessment & Plan:  Depression, unspecified depression type Assessment & Plan: Uncontrolled with recent stressors. Continue Citalopram 20mg  daily, take consistently, if tolerating can increase to 40mg  daily. Follow up in office in 4 weeks or sooner if needed. Refill provided for Xanax, she is taking 1-2 times daily.  Orders: -     ALPRAZolam; Take 1 tablet (1 mg total) by mouth 2 (two) times daily as needed for anxiety. TAKE 1 TABLET BY MOUTH THREE TIMES A DAY AS NEEDED FOR ANXIETY Strength: 1 mg  Dispense: 30 tablet; Refill: 0  Essential hypertension, benign Assessment & Plan: BP 98/70 today and in 100s at home. Will hold Amlodipine and follow up in 1.5 weeks at next OV in setting of recent dizziness.   Other orders -     Citalopram Hydrobromide; Take 1 tablet (40 mg total) by mouth daily.  Dispense: 90 tablet; Refill: 1     Follow up plan: Return in about 4 weeks (around 05/21/2023) for anxiety/depression, hypertension.  Park Meo, FNP

## 2023-04-24 ENCOUNTER — Telehealth: Payer: Self-pay

## 2023-04-24 DIAGNOSIS — F32A Depression, unspecified: Secondary | ICD-10-CM

## 2023-04-24 NOTE — Telephone Encounter (Signed)
Prescription Request  04/24/2023  LOV: 04/23/23  What is the name of the medication or equipment? traZODone (DESYREL) 50 MG tablet [[id number]]   Have you contacted your pharmacy to request a refill? Yes   Which pharmacy would you like this sent to?  [id number] [place], [place] - [address] [place] [address] Phone: [phone] Fax: [phone]    Patient notified that their request is being sent to the clinical staff for review and that they should receive a response within 2 business days.   Please advise at [facility] [phone]

## 2023-04-25 ENCOUNTER — Other Ambulatory Visit: Payer: Medicare HMO

## 2023-04-25 ENCOUNTER — Telehealth: Payer: Self-pay

## 2023-04-25 ENCOUNTER — Other Ambulatory Visit: Payer: Self-pay | Admitting: Family Medicine

## 2023-04-25 DIAGNOSIS — Z79899 Other long term (current) drug therapy: Secondary | ICD-10-CM | POA: Diagnosis not present

## 2023-04-25 DIAGNOSIS — I10 Essential (primary) hypertension: Secondary | ICD-10-CM | POA: Diagnosis not present

## 2023-04-25 DIAGNOSIS — E785 Hyperlipidemia, unspecified: Secondary | ICD-10-CM | POA: Diagnosis not present

## 2023-04-25 DIAGNOSIS — R739 Hyperglycemia, unspecified: Secondary | ICD-10-CM | POA: Diagnosis not present

## 2023-04-25 DIAGNOSIS — R6510 Systemic inflammatory response syndrome (SIRS) of non-infectious origin without acute organ dysfunction: Secondary | ICD-10-CM

## 2023-04-25 DIAGNOSIS — G4700 Insomnia, unspecified: Secondary | ICD-10-CM

## 2023-04-25 MED ORDER — ALPRAZOLAM 1 MG PO TABS: 1.0000 mg | ORAL_TABLET | Freq: Two times a day (BID) | ORAL | 0 refills | Status: DC | PRN

## 2023-04-25 NOTE — Telephone Encounter (Signed)
Copied from CRM [id number]. Topic: Clinical - Medication Question >> Apr 25, 2023  2:10 PM Donita [name] wrote: Reason for CRM: pt would like to know why rx ALPRAZolam Prudy Feeler) 1 MG tablet was not sent Pharmacy

## 2023-04-25 NOTE — Telephone Encounter (Signed)
Copied from CRM [id number]. Topic: Clinical - Medication Refill >> Apr 25, 2023  2:12 PM Donita [name] wrote: Most Recent Primary Care Visit:  Provider: Kurtis Bushman S  Department: [facility]  Visit Type: ACUTE  Date: 04/23/2023  Medication:  traZODone (DESYREL) 50 MG tablet   Has the patient contacted their pharmacy? Yes (Agent: If no, request that the patient contact the pharmacy for the refill. If patient does not wish to contact the pharmacy document the reason why and proceed with request.) (Agent: If yes, when and what did the pharmacy advise?)  Is this the correct pharmacy for this prescription? Yes If no, delete pharmacy and type the correct one.  This is the patient's preferred pharmacy:  [id number] [place], [place] - [address] [place] [address] Phone: [phone] Fax: [phone]   Has the prescription been filled recently? No  Is the patient out of the medication? Yes  Has the patient been seen for an appointment in the last year OR does the patient have an upcoming appointment? Yes  Can we respond through MyChart? Yes  Agent: Please be advised that Rx refills may take up to 3 business days. We ask that you follow-up with your pharmacy.

## 2023-04-26 ENCOUNTER — Telehealth: Payer: Self-pay | Admitting: Family Medicine

## 2023-04-26 NOTE — Telephone Encounter (Signed)
Prescription Request  04/26/2023  LOV: 04/23/2023  What is the name of the medication or equipment? Xanax   Have you contacted your pharmacy to request a refill? Yes -- the pharmacy sent a request for clarification of how the patient is supposed to take medication. It currently reads take 1 tablet by mouth 2 times adaily as needed for anxiety. Take 1 tablet by mouth three times a day as needed for anxiety.   Which pharmacy would you like this sent to?  [id number] [place], [place] - [address] [place] [address] Phone: [phone] Fax: [phone]    Patient notified that their request is being sent to the clinical staff for review and that they should receive a response within 2 business days.   Please advise at [facility] [phone]

## 2023-05-01 ENCOUNTER — Encounter: Payer: Self-pay | Admitting: Family Medicine

## 2023-05-01 ENCOUNTER — Telehealth: Payer: Medicare HMO | Admitting: Family Medicine

## 2023-05-01 DIAGNOSIS — R7309 Other abnormal glucose: Secondary | ICD-10-CM

## 2023-05-01 DIAGNOSIS — E785 Hyperlipidemia, unspecified: Secondary | ICD-10-CM | POA: Diagnosis not present

## 2023-05-01 DIAGNOSIS — R197 Diarrhea, unspecified: Secondary | ICD-10-CM | POA: Diagnosis not present

## 2023-05-01 DIAGNOSIS — F32A Depression, unspecified: Secondary | ICD-10-CM | POA: Diagnosis not present

## 2023-05-01 MED ORDER — ROSUVASTATIN CALCIUM 10 MG PO TABS: 10.0000 mg | ORAL_TABLET | Freq: Every day | ORAL | 3 refills | Status: DC

## 2023-05-01 NOTE — Progress Notes (Signed)
Virtual Visit via Video note  I connected with Vicki [name] on 05/01/23 at 1128 by video and verified that I am speaking with the correct person using two identifiers. Vicki [name] is currently located at home and her husband is currently with her during visit. The provider, Park Meo, FNP is located in their office at time of visit.  I discussed the limitations, risks, security and privacy concerns of performing an evaluation and management service by video and the availability of in person appointments. I also discussed with the patient that there may be a patient responsible charge related to this service. The patient expressed understanding and agreed to proceed.  Subjective: PCP: Park Meo, FNP  No chief complaint on file.   HPI Today Vicki [name] visit was made virtual due to abdominal pain "above her vagina area and in the right side", nausea, and diarrhea 6 times today that is watery and foul smelling. This started Sunday, improved yesterday, and is recurring this AM. Associated with chills. Denies fever, body aches, vomiting, hematochezia, melena. Has tried zofran, immodium, and tylenol. No recent antibiotics.  For follow-up today we discussed Vicki [name] hyperlipidemia. Based on her elevated numbers and risks she is OK with starting a cholesterol lowering medication. In addition, she reports her depression is stable on Citalopram 20mg  daily, she has not yet increased to 40mg  due to her recent illness.  The 10-year ASCVD risk score (Arnett DK, et al., 2019) is: 4.7%   Values used to calculate the score:     Age: 57 years     Sex: Female     Is Non-Hispanic African American: No     Diabetic: No     Tobacco smoker: Yes     Systolic Blood Pressure: 98 mmHg     Is BP treated: Yes     HDL Cholesterol: 67 mg/dL     Total Cholesterol: 293 mg/dL   ROS: Per HPI  Current Outpatient Medications:    rosuvastatin (CRESTOR) 10 MG tablet, Take 1 tablet (10 mg  total) by mouth daily., Disp: 90 tablet, Rfl: 3   acetaminophen (TYLENOL) 500 MG tablet, Take 500 mg by mouth every 6 (six) hours as needed for moderate pain., Disp: , Rfl:    albuterol (VENTOLIN HFA) 108 (90 Base) MCG/ACT inhaler, INHALE 2 PUFFS INTO THE LUNGS TWICE A DAY (Patient taking differently: Inhale 2 puffs into the lungs every 4 (four) hours.), Disp: 18 each, Rfl: 11   ALPRAZolam (XANAX) 1 MG tablet, Take 1 tablet (1 mg total) by mouth 2 (two) times daily as needed for anxiety. TAKE 1 TABLET BY MOUTH THREE TIMES A DAY AS NEEDED FOR ANXIETY Strength: 1 mg, Disp: 30 tablet, Rfl: 0   amLODipine (NORVASC) 5 MG tablet, Take 1 tablet (5 mg total) by mouth daily., Disp: 90 tablet, Rfl: 1   ASPIRIN LOW DOSE 81 MG EC tablet, TAKE 1 TABLET BY MOUTH 2 TIMES DAILY. (Patient taking differently: Take 81 mg by mouth in the morning and at bedtime.), Disp: 180 tablet, Rfl: 1   azelastine (ASTELIN) 0.1 % nasal spray, Place 1 spray into both nostrils 2 (two) times daily. Use in each nostril as directed (Patient taking differently: Place 1 spray into both nostrils daily.), Disp: 30 mL, Rfl: 12   citalopram (CELEXA) 40 MG tablet, Take 1 tablet (40 mg total) by mouth daily., Disp: 90 tablet, Rfl: 1   EPINEPHrine (EPIPEN 2-PAK) 0.3 mg/0.3 mL IJ SOAJ injection, Inject 0.3 mg  into the muscle as needed for anaphylaxis., Disp: 1 each, Rfl: 3   Fluticasone-Umeclidin-Vilant (TRELEGY ELLIPTA) 200-62.5-25 MCG/ACT AEPB, TAKE 1 PUFF BY MOUTH EVERY DAY, Disp: 60 each, Rfl: 11   hydrOXYzine (ATARAX) 25 MG tablet, TAKE 0.5-1 TABLETS (12.5-25 MG TOTAL) BY MOUTH 3 (THREE) TIMES DAILY AS NEEDED FOR ANXIETY., Disp: 270 tablet, Rfl: 2   ipratropium-albuterol (DUONEB) 0.5-2.5 (3) MG/3ML SOLN, Use 1 vial by nebulization in the morning and at bedtime., Disp: 360 mL, Rfl: 0   Magnesium Citrate 100 MG TABS, Take 1 tablet by mouth at bedtime. (Patient taking differently: Take 100 mg by mouth at bedtime.), Disp: 90 tablet, Rfl: 1    meclizine (ANTIVERT) 25 MG tablet, Take 1 tablet (25 mg total) by mouth 3 (three) times daily as needed for dizziness., Disp: 180 tablet, Rfl: 1   metoCLOPramide (REGLAN) 10 MG tablet, Take 1 tablet (10 mg total) by mouth 3 (three) times daily with meals., Disp: 90 tablet, Rfl: 0   omeprazole (PRILOSEC) 40 MG capsule, Take 1 capsule (40 mg total) by mouth 2 (two) times daily. Take 30 mins. Prior to breakfast and supper, Disp: 60 capsule, Rfl: 11   ondansetron (ZOFRAN) 4 MG tablet, Take one tablet by mouth every 4-6 hours as needed, Disp: 60 tablet, Rfl: 5   potassium chloride SA (KLOR-CON M) 20 MEQ tablet, Take 1 tablet (20 mEq total) by mouth daily., Disp: 90 tablet, Rfl: 3   rizatriptan (MAXALT) 10 MG tablet, TAKE 1 TABLET BY MOUTH AS NEEDED FOR MIGRAINE. MAY REPEAT IN 2 HOURS IF NEEDED STRENGTH: 10 MG, Disp: 10 tablet, Rfl: 0   rOPINIRole (REQUIP) 2 MG tablet, TAKE 1/2 TO 1 TABLETS BY MOUTH AT BEDTIME., Disp: 90 tablet, Rfl: 3   traZODone (DESYREL) 50 MG tablet, Take 1 tablet (50 mg total) by mouth at bedtime., Disp: 90 tablet, Rfl: 3  Observations/Objective: Physical Exam Constitutional:      General: She is not in acute distress.    Appearance: Normal appearance. She is ill-appearing. She is not toxic-appearing.  Eyes:     Conjunctiva/sclera: Conjunctivae normal.  Pulmonary:     Effort: Pulmonary effort is normal. No respiratory distress.  Skin:    Coloration: Skin is not pale.  Neurological:     General: No focal deficit present.     Mental Status: She is alert and oriented to person, place, and time.  Psychiatric:        Mood and Affect: Mood normal.        Behavior: Behavior normal.        Thought Content: Thought content normal.        Judgment: Judgment normal.    Assessment and Plan: Hyperlipidemia, unspecified hyperlipidemia type Assessment & Plan: LDL 188, total 293, triglycerides 202. Using shared decision making will start Rosuvastatin 10mg  daily. I recommend consuming  a heart healthy diet such as Mediterranean diet or DASH diet with whole grains, fruits, vegetable, fish, lean meats, nuts, and olive oil. Limit sweets and processed foods. I also encourage moderate intensity exercise 150 minutes weekly. This is 3-5 times weekly for 30-50 minutes each session. Goal should be pace of 3 miles/hours, or walking 1.5 miles in 30 minutes. The 10-year ASCVD risk score (Arnett DK, et al., 2019) is: 4.7%    Elevated glucose level Assessment & Plan: Pending A1c  Orders: -     Hemoglobin A1c; Future  Depression, unspecified depression type Assessment & Plan: Symptoms improved. Continue Citalopram 20mg  daily, take consistently, if tolerating  can increase to 40mg  daily. Follow up in office in 4 weeks or sooner if needed.    Acute diarrhea Assessment & Plan: Recommended urgent in person evaluation due to severity of symptoms and her history of illness. I believe she needs labs at minimum and possibly imaging. She is unable to tolerate PO medications today.   Other orders -     Rosuvastatin Calcium; Take 1 tablet (10 mg total) by mouth daily.  Dispense: 90 tablet; Refill: 3    Follow Up Instructions: No follow-ups on file.   I discussed the assessment and treatment plan with the patient. The patient was provided an opportunity to ask questions and all were answered. The patient agreed with the plan and demonstrated an understanding of the instructions.   The patient was advised to call back or seek an in-person evaluation if the symptoms worsen or if the condition fails to improve as anticipated.  The above assessment and management plan was discussed with the patient. The patient verbalized understanding of and has agreed to the management plan. Patient is aware to call the clinic if symptoms persist or worsen. Patient is aware when to return to the clinic for a follow-up visit. Patient educated on when it is appropriate to go to the emergency department.   Time  call ended: 1146  I provided 18 minutes of face-to-face time during this encounter.   Kurtis Bushman, MSN, APRN, FNP-C [facility]

## 2023-05-01 NOTE — Assessment & Plan Note (Signed)
Pending A1c

## 2023-05-01 NOTE — Assessment & Plan Note (Signed)
Recommended urgent in person evaluation due to severity of symptoms and her history of illness. I believe she needs labs at minimum and possibly imaging. She is unable to tolerate PO medications today.

## 2023-05-01 NOTE — Assessment & Plan Note (Signed)
LDL 188, total 293, triglycerides 202. Using shared decision making will start Rosuvastatin 10mg  daily. I recommend consuming a heart healthy diet such as Mediterranean diet or DASH diet with whole grains, fruits, vegetable, fish, lean meats, nuts, and olive oil. Limit sweets and processed foods. I also encourage moderate intensity exercise 150 minutes weekly. This is 3-5 times weekly for 30-50 minutes each session. Goal should be pace of 3 miles/hours, or walking 1.5 miles in 30 minutes. The 10-year ASCVD risk score (Arnett DK, et al., 2019) is: 4.7%

## 2023-05-01 NOTE — Assessment & Plan Note (Signed)
Symptoms improved. Continue Citalopram 20mg  daily, take consistently, if tolerating can increase to 40mg  daily. Follow up in office in 4 weeks or sooner if needed.

## 2023-05-02 NOTE — Telephone Encounter (Unsigned)
Copied from CRM [id number]. Topic: Clinical - Medication Refill >> May 02, 2023 11:03 AM Hector Shade B wrote: Most Recent Primary Care Visit:  Provider: Kurtis Bushman S  Department: [facility]  Visit Type: MYCHART VIDEO VISIT  Date: 05/01/2023  Medication: Trazadone 50mg    Has the patient contacted their pharmacy? Yes (Agent: If no, request that the patient contact the pharmacy for the refill. If patient does not wish to contact the pharmacy document the reason why and proceed with request.) (Agent: If yes, when and what did the pharmacy advise?)Was told she would have to contact the provider's office   Is this the correct pharmacy for this prescription? Yes If no, delete pharmacy and type the correct one.  This is the patient's preferred pharmacy:  [id number] [place], [place] - [address] [place] [address] Phone: [phone] Fax: [phone]   Has the prescription been filled recently? No  Starting Fri 07/07/2021,   Is the patient out of the medication? Yes  Has the patient been seen for an appointment in the last year OR does the patient have an upcoming appointment? Yes  Can we respond through MyChart? Yes  Agent: Please be advised that Rx refills may take up to 3 business days. We ask that you follow-up with your pharmacy.

## 2023-05-03 LAB — CBC WITH DIFFERENTIAL/PLATELET
Absolute Lymphocytes: 3151 {cells}/uL (ref 850–3900)
Absolute Monocytes: 1071 {cells}/uL — ABNORMAL HIGH (ref 200–950)
Basophils Absolute: 52 {cells}/uL (ref 0–200)
Basophils Relative: 0.5 %
Eosinophils Absolute: 322 {cells}/uL (ref 15–500)
Eosinophils Relative: 3.1 %
HCT: 40.9 % (ref 35.0–45.0)
Hemoglobin: 13.5 g/dL (ref 11.7–15.5)
MCH: 32.1 pg (ref 27.0–33.0)
MCHC: 33 g/dL (ref 32.0–36.0)
MCV: 97.1 fL (ref 80.0–100.0)
MPV: 10.7 fL (ref 7.5–12.5)
Monocytes Relative: 10.3 %
Neutro Abs: 5803 {cells}/uL (ref 1500–7800)
Neutrophils Relative %: 55.8 %
Platelets: 226 10*3/uL (ref 140–400)
RBC: 4.21 10*6/uL (ref 3.80–5.10)
RDW: 12 % (ref 11.0–15.0)
Total Lymphocyte: 30.3 %
WBC: 10.4 10*3/uL (ref 3.8–10.8)

## 2023-05-03 LAB — COMPLETE METABOLIC PANEL WITH GFR
AG Ratio: 1.5 (calc) (ref 1.0–2.5)
ALT: 21 U/L (ref 6–29)
AST: 46 U/L — ABNORMAL HIGH (ref 10–35)
Albumin: 4.2 g/dL (ref 3.6–5.1)
Alkaline phosphatase (APISO): 161 U/L — ABNORMAL HIGH (ref 37–153)
BUN: 11 mg/dL (ref 7–25)
CO2: 29 mmol/L (ref 20–32)
Calcium: 8.7 mg/dL (ref 8.6–10.4)
Chloride: 103 mmol/L (ref 98–110)
Creat: 0.76 mg/dL (ref 0.50–1.03)
Globulin: 2.8 g/dL (ref 1.9–3.7)
Glucose, Bld: 122 mg/dL — ABNORMAL HIGH (ref 65–99)
Potassium: 4 mmol/L (ref 3.5–5.3)
Sodium: 142 mmol/L (ref 135–146)
Total Bilirubin: 0.5 mg/dL (ref 0.2–1.2)
Total Protein: 7 g/dL (ref 6.1–8.1)
eGFR: 92 mL/min/{1.73_m2} (ref >=60)

## 2023-05-03 LAB — HEMOGLOBIN A1C
Hgb A1c MFr Bld: 5.3 %{Hb} (ref <5.7)
Mean Plasma Glucose: 105 mg/dL
eAG (mmol/L): 5.8 mmol/L

## 2023-05-03 LAB — LIPID PANEL
Cholesterol: 293 mg/dL — ABNORMAL HIGH (ref <200)
HDL: 67 mg/dL (ref >=50)
LDL Cholesterol (Calc): 188 mg/dL — ABNORMAL HIGH
Non-HDL Cholesterol (Calc): 226 mg/dL — ABNORMAL HIGH (ref <130)
Total CHOL/HDL Ratio: 4.4 (calc) (ref <5.0)
Triglycerides: 202 mg/dL — ABNORMAL HIGH (ref <150)

## 2023-05-06 ENCOUNTER — Other Ambulatory Visit: Payer: Self-pay | Admitting: Family Medicine

## 2023-05-06 DIAGNOSIS — G4700 Insomnia, unspecified: Secondary | ICD-10-CM

## 2023-05-06 MED ORDER — TRAZODONE HCL 50 MG PO TABS: 50.0000 mg | ORAL_TABLET | Freq: Every day | ORAL | 3 refills | Status: DC

## 2023-05-15 ENCOUNTER — Other Ambulatory Visit: Payer: Self-pay | Admitting: Family Medicine

## 2023-05-16 ENCOUNTER — Other Ambulatory Visit: Payer: Self-pay | Admitting: Family Medicine

## 2023-05-16 DIAGNOSIS — L299 Pruritus, unspecified: Secondary | ICD-10-CM

## 2023-05-16 DIAGNOSIS — E876 Hypokalemia: Secondary | ICD-10-CM

## 2023-05-16 NOTE — Telephone Encounter (Signed)
 Prescription Request  05/16/2023  LOV: 04/23/2023  What is the name of the medication or equipment? potassium chloride  SA (KLOR-CON  M) 20 MEQ tablet , ondansetron  (ZOFRAN ) 4 MG tablet , and Montelukast  Sod 10 mg tablet take 1 tablet by mouth at bedtime  Have you contacted your pharmacy to request a refill? Yes   Which pharmacy would you like this sent to?  [id number] [place], [place] - [address] [place]  [address] Phone: [phone] Fax: [phone]    Patient notified that their request is being sent to the clinical staff for review and that they should receive a response within 2 business days.   Please advise at [facility] [phone]

## 2023-05-20 ENCOUNTER — Other Ambulatory Visit: Payer: Self-pay

## 2023-05-20 DIAGNOSIS — E876 Hypokalemia: Secondary | ICD-10-CM

## 2023-05-20 DIAGNOSIS — L299 Pruritus, unspecified: Secondary | ICD-10-CM

## 2023-05-20 DIAGNOSIS — J302 Other seasonal allergic rhinitis: Secondary | ICD-10-CM

## 2023-05-20 NOTE — Telephone Encounter (Signed)
 Prescription Request  05/20/2023  LOV: 05/01/23  What is the name of the medication or equipment? potassium chloride  SA (KLOR-CON  M) 20 MEQ tablet [[id number]]  Have you contacted your pharmacy to request a refill? Yes   Which pharmacy would you like this sent to?  [id number] [place], [place] - [address] [place] [address] Phone: [phone] Fax: [phone]    Patient notified that their request is being sent to the clinical staff for review and that they should receive a response within 2 business days.   Please advise at [facility] [phone]  Prescription Request  05/20/2023  LOV: 05/01/23  What is the name of the medication or equipment? Montelukast  sod 10 mg tablet (med not found on medlist)  Have you contacted your pharmacy to request a refill? Yes   Which pharmacy would you like this sent to?  [id number] [place], [place] - [address] [facility] [address] [facility] [address] [place] [address] Phone: [phone] Fax: [phone]    Patient notified that their request is being sent to the clinical staff for review and that they should receive a response within 2 business days.   Please advise at [facility] [phone]  Prescription Request  05/20/2023  LOV: 05/01/23  What is the name of the medication or equipment? azelastine  (ASTELIN ) 0.1 % nasal spray [[id number]]   Have you contacted your pharmacy to request a refill? Yes   Which pharmacy would you like this sent to?  [id number] [place], [place] - [address] [facility] [address] [place] [address] Phone: [phone] Fax: [phone]    Patient notified that their request is being sent to the clinical staff for review and that they should receive a response within 2 business days.   Please advise at [facility] [phone]  Prescription Request  05/20/2023  LOV:  05/01/23  What is the name of the medication or equipment? ondansetron  (ZOFRAN ) 4 MG tablet [[id number]]   Have you contacted your pharmacy to request a refill? Yes   Which pharmacy would you like this sent to?  [id number] [place], [place] - [address] [facility] [address] [place] [address] Phone: [phone] Fax: [phone]    Patient notified that their request is being sent to the clinical staff for review and that they should receive a response within 2 business days.   Please advise at [facility] [phone]

## 2023-05-20 NOTE — Telephone Encounter (Signed)
 Requested medication (s) are due for refill today: review  Requested medication (s) are on the active medication list: yes and no   Last refill:  03/04/23  Future visit scheduled: no  Notes to clinic:  Unable to refill per protocol, last refill by another provider. Montelukast  not on med list      Requested Prescriptions  Pending Prescriptions Disp Refills   ondansetron  (ZOFRAN ) 4 MG tablet 60 tablet 5    Sig: Take one tablet by mouth every 4-6 hours as needed     Not Delegated - Gastroenterology: Antiemetics - ondansetron  Failed - 05/20/2023  2:15 PM      Failed - This refill cannot be delegated      Failed - AST in normal range and within 360 days    AST  Date Value Ref Range Status  04/25/2023 46 (H) 10 - 35 U/L Final         Failed - Valid encounter within last 6 months    Recent Outpatient Visits           1 year ago Pulmonary nodule   [place] [facility] [place] Delbert Clam, MD   2 years ago Essential hypertension, benign   [facility] at [facility], Richard, NP   3 years ago Nausea and vomiting, intractability of vomiting not specified, unspecified vomiting type   [facility] at [facility], Maralee PARAS, FNP   3 years ago Acute non-recurrent maxillary sinusitis   [facility], Mikel HERO, MD   3 years ago Ventral hernia without obstruction or gangrene   [facility], Mikel HERO, MD       Future Appointments             In 3 weeks Neda Jennet LABOR, MD [place] [facility] at [facility] - ALT in normal range and within 360 days    ALT  Date Value Ref Range Status  04/25/2023 21 6 - 29 U/L Final          potassium chloride  SA (KLOR-CON  M) 20 MEQ tablet 90 tablet 3    Sig: Take 1 tablet (20 mEq total) by mouth daily.     Endocrinology:  Minerals - Potassium Supplementation Failed - 05/20/2023  2:15 PM      Failed - Valid encounter within  last 12 months    Recent Outpatient Visits           1 year ago Pulmonary nodule   [place] [facility] [place]. [facility] Delbert Clam, MD   2 years ago Essential hypertension, benign   [facility] at [facility], Richard, NP   3 years ago Nausea and vomiting, intractability of vomiting not specified, unspecified vomiting type   [facility] at [facility] Just, Maralee PARAS, FNP   3 years ago Acute non-recurrent maxillary sinusitis   [facility], Mikel HERO, MD   3 years ago Ventral hernia without obstruction or gangrene   [facility], Mikel HERO, MD       Future Appointments             In 3 weeks Neda Jennet LABOR, MD [facility]monary Care at [facility] - K in normal  range and within 360 days    Potassium  Date Value Ref Range Status  04/25/2023 4.0 3.5 - 5.3 mmol/L Final         Passed - Cr in normal range and within 360 days    Creat  Date Value Ref Range Status  04/25/2023 0.76 0.50 - 1.03 mg/dL Final   Creatinine, Urine  Date Value Ref Range Status  04/14/2022 15 mg/dL Final    Comment:    Performed at [facility], [address], [place], [place] [address]          montelukast  (SINGULAIR ) 10 MG tablet 90 tablet 3    Sig: Take 1 tablet (10 mg total) by mouth at bedtime.     Pulmonology:  Leukotriene Inhibitors Failed - 05/20/2023  2:15 PM      Failed - Valid encounter within last 12 months    Recent Outpatient Visits           1 year ago Pulmonary nodule   [place] [facility] [place]. [facility] Delbert Clam, MD   2 years ago Essential hypertension, benign   [facility] at [facility], Richard, NP   3 years ago Nausea and vomiting, intractability of vomiting not specified, unspecified vomiting type   Primary Care at [facility] Just, Maralee PARAS, FNP   3 years ago Acute non-recurrent maxillary sinusitis   [facility], Mikel HERO, MD   3 years ago Ventral hernia without obstruction or gangrene   [facility], Mikel HERO, MD       Future Appointments             In 3 weeks Neda Jennet LABOR, MD [facility] at [facility]

## 2023-05-22 ENCOUNTER — Other Ambulatory Visit: Payer: Self-pay | Admitting: Family Medicine

## 2023-05-22 DIAGNOSIS — G43909 Migraine, unspecified, not intractable, without status migrainosus: Secondary | ICD-10-CM

## 2023-05-23 NOTE — Telephone Encounter (Signed)
 Requested medication (s) are due for refill today:   Yes but needs provider review.   Out of date rx and non delegated too  Requested medication (s) are on the active medication list:   All except Singulair .    Future visit scheduled:   Yes 11/07/2023 is an open date but no provider listed.   LOV (VV) with Amber on 05/01/2023.   Last ordered: Astelin  07/07/2021 30 ml, 12 refills,  Potassium 10/10/2021 #90, 3 refills;   Zofran  03/04/2023 #60, 5 refills;   Singulair  not on list.  It was discontinued 04/16/2022.     Returned because it looks like Chief Technology Officer did not send in new rx.  There is a list but none of them were sent in.    Amber to review.    Requested Prescriptions  Pending Prescriptions Disp Refills   azelastine  (ASTELIN ) 0.1 % nasal spray 30 mL 12    Sig: Place 1 spray into both nostrils 2 (two) times daily. Use in each nostril as directed     Ear, Nose, and Throat: Nasal Preparations - Antiallergy Failed - 05/23/2023  9:19 AM      Failed - Valid encounter within last 12 months    Recent Outpatient Visits           1 year ago Pulmonary nodule   [place] [facility] [place]. [facility] Delbert Clam, MD   2 years ago Essential hypertension, benign   [facility] at [facility], Richard, NP   3 years ago Nausea and vomiting, intractability of vomiting not specified, unspecified vomiting type   Primary Care at [facility], Maralee PARAS, FNP   3 years ago Acute non-recurrent maxillary sinusitis   [facility], Mikel HERO, MD   3 years ago Ventral hernia without obstruction or gangrene   [facility], Mikel HERO, MD       Future Appointments             In 3 weeks Neda Jennet LABOR, MD [place] [facility]monary Care at [facility]             potassium chloride  SA (KLOR-CON  M) 20 MEQ tablet 90 tablet 3    Sig: Take 1 tablet (20 mEq total) by mouth daily.     Endocrinology:  Minerals - Potassium  Supplementation Failed - 05/23/2023  9:19 AM      Failed - Valid encounter within last 12 months    Recent Outpatient Visits           1 year ago Pulmonary nodule   [place] [facility] [place]. [facility] Delbert Clam, MD   2 years ago Essential hypertension, benign   [facility] at [facility], Richard, NP   3 years ago Nausea and vomiting, intractability of vomiting not specified, unspecified vomiting type   Primary Care at [facility] Just, Maralee PARAS, FNP   3 years ago Acute non-recurrent maxillary sinusitis   [facility], Mikel HERO, MD   3 years ago Ventral hernia without obstruction or gangrene   [facility], Mikel HERO, MD       Future Appointments             In 3 weeks Neda Jennet LABOR, MD [facility] at [facility] -  K in normal range and within 360 days    Potassium  Date Value Ref Range Status  04/25/2023 4.0 3.5 - 5.3 mmol/L Final         Passed - Cr in normal range and within 360 days    Creat  Date Value Ref Range Status  04/25/2023 0.76 0.50 - 1.03 mg/dL Final   Creatinine, Urine  Date Value Ref Range Status  04/14/2022 15 mg/dL Final    Comment:    Performed at [facility], [address], [place], [place] [address]          ondansetron  (ZOFRAN ) 4 MG tablet 60 tablet 5    Sig: Take one tablet by mouth every 4-6 hours as needed     Not Delegated - Gastroenterology: Antiemetics - ondansetron  Failed - 05/23/2023  9:19 AM      Failed - This refill cannot be delegated      Failed - AST in normal range and within 360 days    AST  Date Value Ref Range Status  04/25/2023 46 (H) 10 - 35 U/L Final         Failed - Valid encounter within last 6 months    Recent Outpatient Visits           1 year ago Pulmonary nodule   [place] [facility] [place]. [facility] Delbert Clam, MD   2 years ago  Essential hypertension, benign   [facility] at [facility], Richard, NP   3 years ago Nausea and vomiting, intractability of vomiting not specified, unspecified vomiting type   [facility] at [place] [facility], Maralee PARAS, FNP   3 years ago Acute non-recurrent maxillary sinusitis   [facility], Mikel HERO, MD   3 years ago Ventral hernia without obstruction or gangrene   [facility], Mikel HERO, MD       Future Appointments             In 3 weeks Neda Jennet LABOR, MD [place] [facility] Care at [facility] - ALT in normal range and within 360 days    ALT  Date Value Ref Range Status  04/25/2023 21 6 - 29 U/L Final          montelukast  (SINGULAIR ) 10 MG tablet 90 tablet 3    Sig: Take 1 tablet (10 mg total) by mouth at bedtime.     Pulmonology:  Leukotriene Inhibitors Failed - 05/23/2023  9:19 AM      Failed - Valid encounter within last 12 months    Recent Outpatient Visits           1 year ago Pulmonary nodule   [place] [facility] [place]. [facility] Delbert Clam, MD   2 years ago Essential hypertension, benign   [facility] at [facility], Richard, NP   3 years ago Nausea and vomiting, intractability of vomiting not specified, unspecified vomiting type   Primary Care at [facility] Just, Maralee PARAS, FNP   3 years ago Acute non-recurrent maxillary sinusitis   [facility], Mikel HERO, MD   3 years ago Ventral hernia without obstruction or gangrene   [facility], Mikel HERO, MD       Future Appointments  In 3 weeks Neda Jennet LABOR, MD [place] [facility]monary Care at [facility]

## 2023-06-06 ENCOUNTER — Other Ambulatory Visit: Payer: Self-pay | Admitting: Family Medicine

## 2023-06-06 DIAGNOSIS — F32A Depression, unspecified: Secondary | ICD-10-CM

## 2023-06-06 MED ORDER — ALPRAZOLAM 1 MG PO TABS: 1.0000 mg | ORAL_TABLET | Freq: Two times a day (BID) | ORAL | 0 refills | Status: DC | PRN

## 2023-06-06 NOTE — Telephone Encounter (Signed)
Copied from CRM [id number]. Topic: Clinical - Medication Refill >> Jun 06, 2023 12:36 PM [name] wrote: Most Recent Primary Care Visit:  Provider: Kurtis Bushman S  [facility]  Visit Type: MYCHART VIDEO VISIT  Date: 05/01/2023  Medication: ALPRAZolam Prudy Feeler) 1 MG tablet  Has the patient contacted their pharmacy? Yes Said to call office   Is this the correct pharmacy for this prescription? Yes If no, delete pharmacy and type the correct one.  This is the patient's preferred pharmacy:  [id number] [place], [place] - [address] [facility] [address] [facility] [address] [place] [address] Phone: [phone] Fax: [phone]   Has the prescription been filled recently? No  Is the patient out of the medication? Yes  Has the patient been seen for an appointment in the last year OR does the patient have an upcoming appointment? No  Can we respond through MyChart? Yes  Agent: Please be advised that Rx refills may take up to 3 business days. We ask that you follow-up with your pharmacy.

## 2023-06-10 ENCOUNTER — Other Ambulatory Visit: Payer: Self-pay | Admitting: Family Medicine

## 2023-06-10 DIAGNOSIS — G43909 Migraine, unspecified, not intractable, without status migrainosus: Secondary | ICD-10-CM

## 2023-06-10 DIAGNOSIS — F32A Depression, unspecified: Secondary | ICD-10-CM

## 2023-06-10 MED ORDER — ALPRAZOLAM 1 MG PO TABS: 1.0000 mg | ORAL_TABLET | Freq: Two times a day (BID) | ORAL | 0 refills | Status: DC | PRN

## 2023-06-11 ENCOUNTER — Telehealth: Payer: Self-pay | Admitting: Family Medicine

## 2023-06-11 NOTE — Telephone Encounter (Signed)
Prescription Request  06/11/2023  LOV: 04/23/2023  What is the name of the medication or equipment?   promethazine (PHENERGAN) 25 MG tablet [[id number]]  DISCONTINUED  **prescribed by previous provider**  Have you contacted your pharmacy to request a refill? Yes   Which pharmacy would you like this sent to?  [id number] [place], [place] - [address] [facility] [address] [place] [address] Phone: [phone] Fax: [phone]    Patient notified that their request is being sent to the clinical staff for review and that they should receive a response within 2 business days.   Please advise pharmacist.

## 2023-06-12 ENCOUNTER — Encounter (HOSPITAL_BASED_OUTPATIENT_CLINIC_OR_DEPARTMENT_OTHER): Payer: Medicare HMO

## 2023-06-13 ENCOUNTER — Ambulatory Visit: Payer: Medicare HMO | Admitting: Pulmonary Disease

## 2023-07-08 ENCOUNTER — Ambulatory Visit: Payer: Medicare HMO | Admitting: Gastroenterology

## 2023-07-15 ENCOUNTER — Telehealth: Payer: Self-pay

## 2023-07-15 ENCOUNTER — Other Ambulatory Visit: Payer: Self-pay | Admitting: Family Medicine

## 2023-07-15 DIAGNOSIS — J449 Chronic obstructive pulmonary disease, unspecified: Secondary | ICD-10-CM

## 2023-07-15 DIAGNOSIS — F32A Depression, unspecified: Secondary | ICD-10-CM

## 2023-07-15 MED ORDER — ALBUTEROL SULFATE HFA 108 (90 BASE) MCG/ACT IN AERS: INHALATION_SPRAY | RESPIRATORY_TRACT | 11 refills | Status: AC

## 2023-07-15 MED ORDER — ALPRAZOLAM 1 MG PO TABS: 1.0000 mg | ORAL_TABLET | Freq: Two times a day (BID) | ORAL | 0 refills | Status: DC | PRN

## 2023-07-15 NOTE — Telephone Encounter (Signed)
 Copied from CRM [id number]. Topic: Clinical - Medication Refill >> Jul 15, 2023  1:29 PM Gery Pray wrote: Most Recent Primary Care Visit:  Provider: Kurtis Bushman S  Department: [facility]  Visit Type: MYCHART VIDEO VISIT  Date: 05/01/2023  Medication: ALPRAZolam (XANAX) 1 MG tablet, albuterol (VENTOLIN HFA) 108 (90 Base) MCG/ACT inhaler  Has the patient contacted their pharmacy? Yes (Agent: If no, request that the patient contact the pharmacy for the refill. If patient does not wish to contact the pharmacy document the reason why and proceed with request.) (Agent: If yes, when and what did the pharmacy advise?)  Is this the correct pharmacy for this prescription? Yes If no, delete pharmacy and type the correct one.  This is the patient's preferred pharmacy:  [id number] [place], [place] - [address] [address] [facility] [facility] [address] [place] [address] Phone: [phone] Fax: [phone]   Has the prescription been filled recently? No  Is the patient out of the medication? Yes  Has the patient been seen for an appointment in the last year OR does the patient have an upcoming appointment? Yes  Can we respond through MyChart? Yes  Agent: Please be advised that Rx refills may take up to 3 business days. We ask that you follow-up with your pharmacy.

## 2023-07-24 ENCOUNTER — Other Ambulatory Visit: Payer: Self-pay | Admitting: Family Medicine

## 2023-07-24 DIAGNOSIS — G43909 Migraine, unspecified, not intractable, without status migrainosus: Secondary | ICD-10-CM

## 2023-07-31 ENCOUNTER — Other Ambulatory Visit: Payer: Self-pay | Admitting: Family Medicine

## 2023-08-14 ENCOUNTER — Ambulatory Visit: Payer: Medicare HMO | Admitting: Gastroenterology

## 2023-08-21 ENCOUNTER — Ambulatory Visit: Payer: Medicare HMO | Admitting: Pulmonary Disease

## 2023-08-27 ENCOUNTER — Other Ambulatory Visit: Payer: Self-pay | Admitting: Family Medicine

## 2023-08-27 DIAGNOSIS — G43909 Migraine, unspecified, not intractable, without status migrainosus: Secondary | ICD-10-CM

## 2023-08-27 DIAGNOSIS — F32A Depression, unspecified: Secondary | ICD-10-CM

## 2023-08-27 NOTE — Telephone Encounter (Unsigned)
 Copied from CRM [id number]. Topic: Clinical - Medication Refill >> Aug 27, 2023 12:41 PM Crispin Dolphin wrote: Most Recent Primary Care Visit:  Provider: Yolanda Hence S  Department: [facility]  Visit Type: MYCHART VIDEO VISIT  Date: 05/01/2023  Medication: ALPRAZolam (XANAX) 1 MG tablet   Has the patient contacted their pharmacy? No (Agent: If no, request that the patient contact the pharmacy for the refill. If patient does not wish to contact the pharmacy document the reason why and proceed with request.) (Agent: If yes, when and what did the pharmacy advise?)  Is this the correct pharmacy for this prescription? Yes If no, delete pharmacy and type the correct one.  This is the patient's preferred pharmacy:  [id number] [place], [place] - [address] [facility] [address] [place] [address] Phone: [phone] Fax: [phone]   Has the prescription been filled recently? Yes  Is the patient out of the medication? Yes  Has the patient been seen for an appointment in the last year OR does the patient have an upcoming appointment? Yes  Can we respond through MyChart? Yes  Agent: Please be advised that Rx refills may take up to 3 business days. We ask that you follow-up with your pharmacy.

## 2023-08-28 MED ORDER — ALPRAZOLAM 1 MG PO TABS: 1.0000 mg | ORAL_TABLET | Freq: Two times a day (BID) | ORAL | 0 refills | Status: DC | PRN

## 2023-08-28 NOTE — Telephone Encounter (Signed)
 Requested medication (s) are due for refill today: Yes  Requested medication (s) are on the active medication list: Yes  Last refill:  07/15/23, #30, 0 refills  Future visit scheduled: Yes  Notes to clinic:  Unable to refill per protocol, cannot delegate.      Requested Prescriptions  Pending Prescriptions Disp Refills   ALPRAZolam (XANAX) 1 MG tablet 30 tablet 0    Sig: Take 1 tablet (1 mg total) by mouth 2 (two) times daily as needed for anxiety.     Not Delegated - Psychiatry: Anxiolytics/Hypnotics 2 Failed - 08/28/2023  2:13 PM      Failed - This refill cannot be delegated      Failed - Urine Drug Screen completed in last 360 days      Passed - Patient is not pregnant      Passed - Valid encounter within last 6 months    Recent Outpatient Visits           3 months ago Hyperlipidemia, unspecified hyperlipidemia type   [facility] Jenelle Mis, FNP   4 months ago Depression, unspecified depression type   [facility] Jenelle Mis, FNP   10 months ago Polypharmacy   [facility] Jenelle Mis, FNP   11 months ago Encounter to establish care with new doctor   [place] [facility] Jenelle Mis, FNP   1 year ago Pulmonary nodule   [place] [facility] [place]. [facility] Joaquin [place], MD

## 2023-09-26 ENCOUNTER — Other Ambulatory Visit: Payer: Self-pay | Admitting: Family Medicine

## 2023-09-26 DIAGNOSIS — G43909 Migraine, unspecified, not intractable, without status migrainosus: Secondary | ICD-10-CM

## 2023-10-08 ENCOUNTER — Emergency Department (HOSPITAL_COMMUNITY)

## 2023-10-08 ENCOUNTER — Ambulatory Visit: Payer: Self-pay

## 2023-10-08 ENCOUNTER — Encounter (HOSPITAL_COMMUNITY): Payer: Self-pay

## 2023-10-08 ENCOUNTER — Other Ambulatory Visit: Payer: Self-pay

## 2023-10-08 ENCOUNTER — Inpatient Hospital Stay (HOSPITAL_COMMUNITY)
Admission: EM | Admit: 2023-10-08 | Discharge: 2023-10-19 | DRG: 853 | Disposition: A | Discharge status: Home Health | Attending: Internal Medicine | Admitting: Internal Medicine

## 2023-10-08 DIAGNOSIS — J449 Chronic obstructive pulmonary disease, unspecified: Secondary | ICD-10-CM | POA: Diagnosis not present

## 2023-10-08 DIAGNOSIS — K650 Generalized (acute) peritonitis: Secondary | ICD-10-CM | POA: Diagnosis not present

## 2023-10-08 DIAGNOSIS — Z7982 Long term (current) use of aspirin: Secondary | ICD-10-CM

## 2023-10-08 DIAGNOSIS — E871 Hypo-osmolality and hyponatremia: Secondary | ICD-10-CM | POA: Diagnosis not present

## 2023-10-08 DIAGNOSIS — R109 Unspecified abdominal pain: Secondary | ICD-10-CM | POA: Diagnosis not present

## 2023-10-08 DIAGNOSIS — J9622 Acute and chronic respiratory failure with hypercapnia: Secondary | ICD-10-CM | POA: Diagnosis not present

## 2023-10-08 DIAGNOSIS — F419 Anxiety disorder, unspecified: Secondary | ICD-10-CM | POA: Diagnosis not present

## 2023-10-08 DIAGNOSIS — R918 Other nonspecific abnormal finding of lung field: Secondary | ICD-10-CM | POA: Diagnosis not present

## 2023-10-08 DIAGNOSIS — F1010 Alcohol abuse, uncomplicated: Secondary | ICD-10-CM | POA: Diagnosis not present

## 2023-10-08 DIAGNOSIS — K5792 Diverticulitis of intestine, part unspecified, without perforation or abscess without bleeding: Secondary | ICD-10-CM | POA: Diagnosis not present

## 2023-10-08 DIAGNOSIS — K5720 Diverticulitis of large intestine with perforation and abscess without bleeding: Secondary | ICD-10-CM | POA: Diagnosis present

## 2023-10-08 DIAGNOSIS — F418 Other specified anxiety disorders: Secondary | ICD-10-CM | POA: Diagnosis present

## 2023-10-08 DIAGNOSIS — R6521 Severe sepsis with septic shock: Secondary | ICD-10-CM | POA: Diagnosis not present

## 2023-10-08 DIAGNOSIS — K760 Fatty (change of) liver, not elsewhere classified: Secondary | ICD-10-CM | POA: Diagnosis present

## 2023-10-08 DIAGNOSIS — D6489 Other specified anemias: Secondary | ICD-10-CM | POA: Diagnosis not present

## 2023-10-08 DIAGNOSIS — Z880 Allergy status to penicillin: Secondary | ICD-10-CM

## 2023-10-08 DIAGNOSIS — E860 Dehydration: Secondary | ICD-10-CM | POA: Diagnosis present

## 2023-10-08 DIAGNOSIS — Z885 Allergy status to narcotic agent status: Secondary | ICD-10-CM

## 2023-10-08 DIAGNOSIS — G2581 Restless legs syndrome: Secondary | ICD-10-CM | POA: Diagnosis present

## 2023-10-08 DIAGNOSIS — K5732 Diverticulitis of large intestine without perforation or abscess without bleeding: Secondary | ICD-10-CM | POA: Diagnosis present

## 2023-10-08 DIAGNOSIS — K5721 Diverticulitis of large intestine with perforation and abscess with bleeding: Secondary | ICD-10-CM | POA: Diagnosis not present

## 2023-10-08 DIAGNOSIS — F32A Depression, unspecified: Secondary | ICD-10-CM | POA: Diagnosis present

## 2023-10-08 DIAGNOSIS — Z90710 Acquired absence of both cervix and uterus: Secondary | ICD-10-CM | POA: Diagnosis not present

## 2023-10-08 DIAGNOSIS — Z720 Tobacco use: Secondary | ICD-10-CM | POA: Diagnosis present

## 2023-10-08 DIAGNOSIS — Z79899 Other long term (current) drug therapy: Secondary | ICD-10-CM

## 2023-10-08 DIAGNOSIS — Z801 Family history of malignant neoplasm of trachea, bronchus and lung: Secondary | ICD-10-CM

## 2023-10-08 DIAGNOSIS — Z888 Allergy status to other drugs, medicaments and biological substances status: Secondary | ICD-10-CM

## 2023-10-08 DIAGNOSIS — K658 Other peritonitis: Secondary | ICD-10-CM | POA: Diagnosis not present

## 2023-10-08 DIAGNOSIS — K567 Ileus, unspecified: Secondary | ICD-10-CM | POA: Diagnosis not present

## 2023-10-08 DIAGNOSIS — F17210 Nicotine dependence, cigarettes, uncomplicated: Secondary | ICD-10-CM | POA: Diagnosis present

## 2023-10-08 DIAGNOSIS — Z9981 Dependence on supplemental oxygen: Secondary | ICD-10-CM | POA: Diagnosis not present

## 2023-10-08 DIAGNOSIS — A419 Sepsis, unspecified organism: Secondary | ICD-10-CM | POA: Diagnosis not present

## 2023-10-08 DIAGNOSIS — R000 Tachycardia, unspecified: Secondary | ICD-10-CM | POA: Diagnosis not present

## 2023-10-08 DIAGNOSIS — Z1152 Encounter for screening for COVID-19: Secondary | ICD-10-CM | POA: Diagnosis not present

## 2023-10-08 DIAGNOSIS — K219 Gastro-esophageal reflux disease without esophagitis: Secondary | ICD-10-CM | POA: Diagnosis present

## 2023-10-08 DIAGNOSIS — Z933 Colostomy status: Secondary | ICD-10-CM | POA: Diagnosis not present

## 2023-10-08 DIAGNOSIS — G4700 Insomnia, unspecified: Secondary | ICD-10-CM | POA: Diagnosis not present

## 2023-10-08 DIAGNOSIS — R188 Other ascites: Secondary | ICD-10-CM | POA: Diagnosis present

## 2023-10-08 DIAGNOSIS — R6520 Severe sepsis without septic shock: Secondary | ICD-10-CM

## 2023-10-08 DIAGNOSIS — R1032 Left lower quadrant pain: Secondary | ICD-10-CM | POA: Diagnosis not present

## 2023-10-08 DIAGNOSIS — J439 Emphysema, unspecified: Secondary | ICD-10-CM | POA: Diagnosis not present

## 2023-10-08 DIAGNOSIS — E872 Acidosis, unspecified: Secondary | ICD-10-CM | POA: Diagnosis present

## 2023-10-08 DIAGNOSIS — Z886 Allergy status to analgesic agent status: Secondary | ICD-10-CM

## 2023-10-08 DIAGNOSIS — E8729 Other acidosis: Secondary | ICD-10-CM | POA: Diagnosis not present

## 2023-10-08 DIAGNOSIS — K6389 Other specified diseases of intestine: Secondary | ICD-10-CM | POA: Diagnosis not present

## 2023-10-08 DIAGNOSIS — E876 Hypokalemia: Secondary | ICD-10-CM | POA: Diagnosis present

## 2023-10-08 DIAGNOSIS — E66811 Obesity, class 1: Secondary | ICD-10-CM | POA: Diagnosis present

## 2023-10-08 DIAGNOSIS — R0602 Shortness of breath: Secondary | ICD-10-CM | POA: Diagnosis not present

## 2023-10-08 DIAGNOSIS — I10 Essential (primary) hypertension: Secondary | ICD-10-CM | POA: Diagnosis not present

## 2023-10-08 DIAGNOSIS — M549 Dorsalgia, unspecified: Secondary | ICD-10-CM | POA: Diagnosis not present

## 2023-10-08 DIAGNOSIS — Z6835 Body mass index (BMI) 35.0-35.9, adult: Secondary | ICD-10-CM

## 2023-10-08 DIAGNOSIS — J9621 Acute and chronic respiratory failure with hypoxia: Secondary | ICD-10-CM | POA: Diagnosis not present

## 2023-10-08 DIAGNOSIS — Z881 Allergy status to other antibiotic agents status: Secondary | ICD-10-CM

## 2023-10-08 DIAGNOSIS — R1084 Generalized abdominal pain: Secondary | ICD-10-CM | POA: Diagnosis not present

## 2023-10-08 DIAGNOSIS — J4489 Other specified chronic obstructive pulmonary disease: Secondary | ICD-10-CM | POA: Diagnosis present

## 2023-10-08 DIAGNOSIS — J441 Chronic obstructive pulmonary disease with (acute) exacerbation: Secondary | ICD-10-CM

## 2023-10-08 DIAGNOSIS — Z803 Family history of malignant neoplasm of breast: Secondary | ICD-10-CM

## 2023-10-08 DIAGNOSIS — R7401 Elevation of levels of liver transaminase levels: Secondary | ICD-10-CM | POA: Diagnosis present

## 2023-10-08 DIAGNOSIS — E8809 Other disorders of plasma-protein metabolism, not elsewhere classified: Secondary | ICD-10-CM | POA: Diagnosis present

## 2023-10-08 DIAGNOSIS — J9611 Chronic respiratory failure with hypoxia: Principal | ICD-10-CM | POA: Diagnosis present

## 2023-10-08 DIAGNOSIS — E785 Hyperlipidemia, unspecified: Secondary | ICD-10-CM | POA: Diagnosis present

## 2023-10-08 DIAGNOSIS — Z7951 Long term (current) use of inhaled steroids: Secondary | ICD-10-CM

## 2023-10-08 DIAGNOSIS — Z8041 Family history of malignant neoplasm of ovary: Secondary | ICD-10-CM

## 2023-10-08 DIAGNOSIS — Z8619 Personal history of other infectious and parasitic diseases: Secondary | ICD-10-CM

## 2023-10-08 DIAGNOSIS — Z808 Family history of malignant neoplasm of other organs or systems: Secondary | ICD-10-CM

## 2023-10-08 LAB — I-STAT CG4 LACTIC ACID, ED
Lactic Acid, Venous: 2.9 mmol/L (ref 0.5–1.9)
Lactic Acid, Venous: 8.7 mmol/L (ref 0.5–1.9)

## 2023-10-08 LAB — COMPREHENSIVE METABOLIC PANEL WITH GFR
ALT: 16 U/L (ref 0–44)
AST: 61 U/L — ABNORMAL HIGH (ref 15–41)
Albumin: 3.4 g/dL — ABNORMAL LOW (ref 3.5–5.0)
Alkaline Phosphatase: 121 U/L (ref 38–126)
Anion gap: 12 (ref 5–15)
BUN: <5 mg/dL — ABNORMAL LOW (ref 6–20)
CO2: 31 mmol/L (ref 22–32)
Calcium: 7.9 mg/dL — ABNORMAL LOW (ref 8.9–10.3)
Chloride: 95 mmol/L — ABNORMAL LOW (ref 98–111)
Creatinine, Ser: 0.7 mg/dL (ref 0.44–1.00)
GFR, Estimated: >60 mL/min (ref >60)
Glucose, Bld: 133 mg/dL — ABNORMAL HIGH (ref 70–99)
Potassium: 3.2 mmol/L — ABNORMAL LOW (ref 3.5–5.1)
Sodium: 138 mmol/L (ref 135–145)
Total Bilirubin: 1.2 mg/dL (ref 0.0–1.2)
Total Protein: 6.9 g/dL (ref 6.5–8.1)

## 2023-10-08 LAB — URINALYSIS, W/ REFLEX TO CULTURE (INFECTION SUSPECTED)
Bilirubin Urine: NEGATIVE
Glucose, UA: NEGATIVE mg/dL
Hgb urine dipstick: NEGATIVE
Ketones, ur: 5 mg/dL — AB
Leukocytes,Ua: NEGATIVE
Nitrite: NEGATIVE
Protein, ur: 30 mg/dL — AB
Specific Gravity, Urine: 1.032 — ABNORMAL HIGH (ref 1.005–1.030)
pH: 5 (ref 5.0–8.0)

## 2023-10-08 LAB — CBC WITH DIFFERENTIAL/PLATELET
Abs Immature Granulocytes: 0.05 10*3/uL (ref 0.00–0.07)
Basophils Absolute: 0 10*3/uL (ref 0.0–0.1)
Basophils Relative: 0 %
Eosinophils Absolute: 0 10*3/uL (ref 0.0–0.5)
Eosinophils Relative: 0 %
HCT: 39.6 % (ref 36.0–46.0)
Hemoglobin: 13 g/dL (ref 12.0–15.0)
Immature Granulocytes: 0 %
Lymphocytes Relative: 8 %
Lymphs Abs: 0.9 10*3/uL (ref 0.7–4.0)
MCH: 31.3 pg (ref 26.0–34.0)
MCHC: 32.8 g/dL (ref 30.0–36.0)
MCV: 95.2 fL (ref 80.0–100.0)
Monocytes Absolute: 0.9 10*3/uL (ref 0.1–1.0)
Monocytes Relative: 8 %
Neutro Abs: 9.4 10*3/uL — ABNORMAL HIGH (ref 1.7–7.7)
Neutrophils Relative %: 84 %
Platelets: 213 10*3/uL (ref 150–400)
RBC: 4.16 MIL/uL (ref 3.87–5.11)
RDW: 13.1 % (ref 11.5–15.5)
WBC: 11.3 10*3/uL — ABNORMAL HIGH (ref 4.0–10.5)
nRBC: 0 % (ref 0.0–0.2)

## 2023-10-08 LAB — RESP PANEL BY RT-PCR (RSV, FLU A&B, COVID)  RVPGX2
Influenza A by PCR: NEGATIVE
Influenza B by PCR: NEGATIVE
Resp Syncytial Virus by PCR: NEGATIVE
SARS Coronavirus 2 by RT PCR: NEGATIVE

## 2023-10-08 LAB — TROPONIN I (HIGH SENSITIVITY)
Troponin I (High Sensitivity): 11 ng/L (ref <18)
Troponin I (High Sensitivity): 5 ng/L (ref <18)

## 2023-10-08 LAB — PROTIME-INR
INR: 1 (ref 0.8–1.2)
Prothrombin Time: 13.6 s (ref 11.4–15.2)

## 2023-10-08 MED ORDER — VANCOMYCIN HCL IN DEXTROSE 1-5 GM/200ML-% IV SOLN
1000.0000 mg | Freq: Once | INTRAVENOUS | Status: AC
  Administered 2023-10-08: 1000 mg via INTRAVENOUS
  Filled 2023-10-08: qty 200

## 2023-10-08 MED ORDER — LACTATED RINGERS IV SOLN: INTRAVENOUS | Status: AC

## 2023-10-08 MED ORDER — ACETAMINOPHEN 650 MG RE SUPP
650.0000 mg | Freq: Four times a day (QID) | RECTAL | Status: DC | PRN
Start: 2023-10-08 — End: 2023-10-09

## 2023-10-08 MED ORDER — FENTANYL CITRATE PF 50 MCG/ML IJ SOSY
50.0000 ug | PREFILLED_SYRINGE | Freq: Once | INTRAMUSCULAR | Status: AC
  Administered 2023-10-08: 50 ug via INTRAVENOUS
  Filled 2023-10-08: qty 1

## 2023-10-08 MED ORDER — ACETAMINOPHEN 325 MG PO TABS
650.0000 mg | ORAL_TABLET | Freq: Four times a day (QID) | ORAL | Status: DC | PRN
  Administered 2023-10-09: 650 mg via ORAL
  Filled 2023-10-08: qty 2

## 2023-10-08 MED ORDER — FENTANYL CITRATE PF 50 MCG/ML IJ SOSY: 50.0000 ug | PREFILLED_SYRINGE | Freq: Once | INTRAMUSCULAR | Status: DC

## 2023-10-08 MED ORDER — MORPHINE SULFATE (PF) 4 MG/ML IV SOLN
4.0000 mg | INTRAVENOUS | Status: DC | PRN
  Administered 2023-10-09 (×3): 4 mg via INTRAVENOUS
  Filled 2023-10-08 (×4): qty 1

## 2023-10-08 MED ORDER — IOHEXOL 350 MG/ML SOLN
75.0000 mL | Freq: Once | INTRAVENOUS | Status: AC | PRN
  Administered 2023-10-08: 75 mL via INTRAVENOUS

## 2023-10-08 MED ORDER — ALBUTEROL SULFATE (2.5 MG/3ML) 0.083% IN NEBU: 2.5000 mg | INHALATION_SOLUTION | Freq: Four times a day (QID) | RESPIRATORY_TRACT | Status: DC | PRN

## 2023-10-08 MED ORDER — SODIUM CHLORIDE 0.9 % IV SOLN
25.0000 mg | Freq: Once | INTRAVENOUS | Status: AC
  Administered 2023-10-08: 25 mg via INTRAVENOUS
  Filled 2023-10-08: qty 1

## 2023-10-08 MED ORDER — LACTATED RINGERS IV BOLUS (SEPSIS)
1000.0000 mL | Freq: Once | INTRAVENOUS | Status: AC
  Administered 2023-10-08: 1000 mL via INTRAVENOUS

## 2023-10-08 MED ORDER — METRONIDAZOLE 500 MG/100ML IV SOLN
500.0000 mg | Freq: Two times a day (BID) | INTRAVENOUS | Status: AC
  Administered 2023-10-09 – 2023-10-15 (×14): 500 mg via INTRAVENOUS
  Filled 2023-10-08 (×16): qty 100

## 2023-10-08 MED ORDER — SODIUM CHLORIDE 0.9 % IV SOLN
2.0000 g | INTRAVENOUS | Status: DC
  Administered 2023-10-09 – 2023-10-11 (×4): 2 g via INTRAVENOUS
  Filled 2023-10-08 (×4): qty 20

## 2023-10-08 MED ORDER — SODIUM CHLORIDE 0.9% FLUSH
3.0000 mL | Freq: Two times a day (BID) | INTRAVENOUS | Status: DC
  Administered 2023-10-10 – 2023-10-19 (×16): 3 mL via INTRAVENOUS

## 2023-10-08 MED ORDER — LACTATED RINGERS IV BOLUS
1000.0000 mL | Freq: Once | INTRAVENOUS | Status: AC
  Administered 2023-10-08: 1000 mL via INTRAVENOUS

## 2023-10-08 MED ORDER — SODIUM CHLORIDE 0.9 % IV SOLN
2.0000 g | Freq: Once | INTRAVENOUS | Status: AC
  Administered 2023-10-08: 2 g via INTRAVENOUS
  Filled 2023-10-08: qty 10

## 2023-10-08 MED ORDER — OXYCODONE-ACETAMINOPHEN 5-325 MG PO TABS
1.0000 | ORAL_TABLET | Freq: Four times a day (QID) | ORAL | Status: DC | PRN
  Filled 2023-10-08: qty 1

## 2023-10-08 MED ORDER — SUMATRIPTAN SUCCINATE 100 MG PO TABS: 100.0000 mg | ORAL_TABLET | ORAL | Status: DC | PRN

## 2023-10-08 MED ORDER — ENOXAPARIN SODIUM 40 MG/0.4ML IJ SOSY
40.0000 mg | PREFILLED_SYRINGE | INTRAMUSCULAR | Status: DC
  Administered 2023-10-09: 40 mg via SUBCUTANEOUS
  Filled 2023-10-08: qty 0.4

## 2023-10-08 MED ORDER — ONDANSETRON HCL 4 MG/2ML IJ SOLN
4.0000 mg | Freq: Once | INTRAMUSCULAR | Status: AC
  Administered 2023-10-08: 4 mg via INTRAVENOUS
  Filled 2023-10-08: qty 2

## 2023-10-08 MED ORDER — MORPHINE SULFATE (PF) 4 MG/ML IV SOLN
4.0000 mg | Freq: Once | INTRAVENOUS | Status: AC
  Administered 2023-10-08: 4 mg via INTRAVENOUS
  Filled 2023-10-08: qty 1

## 2023-10-08 MED ORDER — POTASSIUM CHLORIDE CRYS ER 20 MEQ PO TBCR
40.0000 meq | EXTENDED_RELEASE_TABLET | ORAL | Status: AC
  Administered 2023-10-09: 40 meq via ORAL
  Filled 2023-10-08: qty 2

## 2023-10-08 MED ORDER — CALCIUM GLUCONATE-NACL 2-0.675 GM/100ML-% IV SOLN
2.0000 g | Freq: Once | INTRAVENOUS | Status: AC
  Administered 2023-10-09: 2000 mg via INTRAVENOUS
  Filled 2023-10-08: qty 100

## 2023-10-08 MED ORDER — ACETAMINOPHEN 325 MG PO TABS
650.0000 mg | ORAL_TABLET | Freq: Four times a day (QID) | ORAL | Status: DC | PRN
  Administered 2023-10-08: 650 mg via ORAL
  Filled 2023-10-08: qty 2

## 2023-10-08 MED ORDER — ONDANSETRON HCL 4 MG/2ML IJ SOLN
4.0000 mg | Freq: Four times a day (QID) | INTRAMUSCULAR | Status: DC | PRN
  Administered 2023-10-09 – 2023-10-19 (×14): 4 mg via INTRAVENOUS
  Filled 2023-10-08 (×15): qty 2

## 2023-10-08 MED ORDER — METRONIDAZOLE 500 MG/100ML IV SOLN
500.0000 mg | Freq: Once | INTRAVENOUS | Status: AC
  Administered 2023-10-08: 500 mg via INTRAVENOUS
  Filled 2023-10-08: qty 100

## 2023-10-08 MED ORDER — LACTATED RINGERS IV BOLUS (SEPSIS)
500.0000 mL | Freq: Once | INTRAVENOUS | Status: AC
  Administered 2023-10-08: 500 mL via INTRAVENOUS

## 2023-10-08 MED ORDER — ONDANSETRON HCL 4 MG PO TABS
4.0000 mg | ORAL_TABLET | Freq: Four times a day (QID) | ORAL | Status: DC | PRN
Start: 2023-10-08 — End: 2023-10-15

## 2023-10-08 NOTE — Consult Note (Signed)
 NAME:  Vicki [name], MRN:  [id number], DOB:  07/12/1965, LOS: 0 ADMISSION DATE:  10/08/2023, CONSULTATION DATE:  10/08/2023 REFERRING MD:  Manny Sees, MD, CHIEF COMPLAINT:  Abdominal Pain  History of Present Illness:  58 y/o female with PMH for COPD/Emphysema on 4-5 liters home O2 but still smoking (follows [facility]), Depression, esophagitis, recurrent Diverticulitis and recurrent C diff (follows with GI) who presented with abdominal pain x 1 week and also several days of n/v and 1 week of diarrhea.  She says she initially thought hse had a UTI and trying to treat herself at home and with OTC meds but it did not get better and in the last 3 days has worsened.  Therefore she came to the hospital.  She denies blood in her stool.  Her LA was 2.9 in ED and then repat was 8.7.  She c/o generalized abdominal pain.  Pertinent  Medical History   Anxiety, Arthritis, Asthma, CAP (community acquired pneumonia) (04/07/2019), Carpal tunnel syndrome, Cataract, COPD (chronic obstructive pulmonary disease) (HCC), Depression, Diverticulitis, Emphysema of lung (HCC), Essential hypertension, benign (07/14/2019), Hyperlipidemia, Oxygen deficiency, and Oxygen dependent.   Significant Hospital Events: Including procedures, antibiotic start and stop dates in addition to other pertinent events   5/27: evaluated by PCCM, not offered ICU admission  Interim History / Subjective:  N/a  Objective    Blood pressure 110/79, pulse (!) 113, temperature (!) 100.8 F (38.2 C), resp. rate 18, SpO2 97%.       No intake or output data in the 24 hours ending 10/08/23 2227 There were no vitals filed for this visit.  Examination: General: patient in NAD but seems to be in pain HENT: PERRLA no icterus EOMI, mmm Lungs: CTA no wheezes no rales no rhonchi Cardiovascular: reg s1s2 no rubs Abdomen: tender diffusely, BS pos, pos guarding Extremities: no cyanosis, clubbing or edema Neuro: AAOx 3 CN II to XII  grossly intact   Resolved problem list   Assessment and Plan  Acute Diverticulitis Possible C diff colitis Lactic acidosis Abdominal pain COPD on home O2  At this time she will need continued IVF, Antibiotics, C. Diff w/u, Pain management.  Suggest GI evaluation, surgery has been called for consult.  Monitor electrolytes.  Continue O2 and nebs, currently not in COPD exacerbaiton.  Currently not a candidate for ICU admission.  Please re-consult if condition changes.  Best Practice (right click and "Reselect all SmartList Selections" daily)   Diet/type: NPO DVT prophylaxis prophylactic heparin   Pressure ulcer(s): N/A GI prophylaxis: PPI Lines: N/A Foley:  N/A Code Status:  full code   Labs   CBC: Recent Labs  Lab 10/08/23 1633  WBC 11.3*  NEUTROABS 9.4*  HGB 13.0  HCT 39.6  MCV 95.2  PLT 213    Basic Metabolic Panel: Recent Labs  Lab 10/08/23 1633  NA 138  K 3.2*  CL 95*  CO2 31  GLUCOSE 133*  BUN <5*  CREATININE 0.70  CALCIUM  7.9*   GFR: CrCl cannot be calculated (Unknown ideal weight.). Recent Labs  Lab 10/08/23 1633 10/08/23 1706 10/08/23 1900  WBC 11.3*  --   --   LATICACIDVEN  --  2.9* 8.7*    Liver Function Tests: Recent Labs  Lab 10/08/23 1633  AST 61*  ALT 16  ALKPHOS 121  BILITOT 1.2  PROT 6.9  ALBUMIN  3.4*   No results for input(s): "LIPASE", "AMYLASE" in the last 168 hours. No results for input(s): "AMMONIA" in the last 168 hours.  ABG    Component Value Date/Time   HCO3 32.4 (H) 04/05/2022 1353   TCO2 34 (H) 04/05/2022 1353   O2SAT 90 04/05/2022 1353     Coagulation Profile: Recent Labs  Lab 10/08/23 1633  INR 1.0    Cardiac Enzymes: No results for input(s): "CKTOTAL", "CKMB", "CKMBINDEX", "TROPONINI" in the last 168 hours.  HbA1C: Hgb A1c MFr Bld  Date/Time Value Ref Range Status  04/25/2023 11:36 AM 5.3 <5.7 % of total Hgb Final    Comment:    For the purpose of screening for the presence  of diabetes: . <5.7%       Consistent with the absence of diabetes 5.7-6.4%    Consistent with increased risk for diabetes             (prediabetes) > or =6.5%  Consistent with diabetes . This assay result is consistent with a decreased risk of diabetes. . Currently, no consensus exists regarding use of hemoglobin A1c for diagnosis of diabetes in children. . According to American Diabetes Association (ADA) guidelines, hemoglobin A1c <7.0% represents optimal control in non-pregnant diabetic patients. Different metrics may apply to specific patient populations.  Standards of Medical Care in Diabetes(ADA). Aaron Aas   07/10/2021 01:20 PM 5.4 4.6 - 6.5 % Final    Comment:    Glycemic Control Guidelines for People with Diabetes:Non Diabetic:  <6%Goal of Therapy: <7%Additional Action Suggested:  >8%     CBG: No results for input(s): "GLUCAP" in the last 168 hours.  Review of Systems:   Abd pain, N/V, Diarrhea, no blood in stool, fever/chills, h/o syncopy  Past Medical History:  She,  has a past medical history of Allergy, Anxiety, Arthritis, Asthma, CAP (community acquired pneumonia) (04/07/2019), Carpal tunnel syndrome, Cataract, COPD (chronic obstructive pulmonary disease) (HCC), Depression, Diverticulitis, Emphysema of lung (HCC), Essential hypertension, benign (07/14/2019), Hyperlipidemia, Oxygen deficiency, and Oxygen dependent.   Surgical History:   Past Surgical History:  Procedure Laterality Date   ABDOMINAL HYSTERECTOMY  2009   APPENDECTOMY     ARTHROSCOPIC REPAIR ACL Left 2011/2014   x 2   BIOPSY  01/21/2020   Procedure: BIOPSY;  Surgeon: Sergio Dandy, MD;  Location: [facility];  Service: Endoscopy;;  EGD and COLON   BUNIONECTOMY  2006   both   CARPAL TUNNEL RELEASE Bilateral 10/28/2013   Procedure: BILATERAL CARPAL TUNNEL RELEASE;  Surgeon: Kemp Patter, MD;  Location: [facility];  Service: Orthopedics;  Laterality: Bilateral;   CHOLECYSTECTOMY  1994    CHONDROPLASTY Right 08/31/2020   Procedure: CHONDROPLASTY;  Surgeon: Marlena Sima, MD;  Location: [facility];  Service: Orthopedics;  Laterality: Right;   COLONOSCOPY WITH PROPOFOL  N/A 01/21/2020   Procedure: COLONOSCOPY WITH PROPOFOL ;  Surgeon: Sergio Dandy, MD;  Location: [facility];  Service: Endoscopy;  Laterality: N/A;   DILATION AND CURETTAGE OF UTERUS     ESOPHAGOGASTRODUODENOSCOPY (EGD) WITH PROPOFOL  N/A 01/21/2020   Procedure: ESOPHAGOGASTRODUODENOSCOPY (EGD) WITH PROPOFOL ;  Surgeon: Sergio Dandy, MD;  Location: [facility];  Service: Endoscopy;  Laterality: N/A;   KNEE ARTHROSCOPY WITH ANTERIOR CRUCIATE LIGAMENT (ACL) REPAIR Right 08/31/2020   Procedure: RIGHT KNEE ARTHROSCOPY WITH ANTERIOR CRUCIATE LIGAMENT (ACL) REPAIR MEDIAL AND LATERAL  MENISECTOMY;  Surgeon: Marlena Sima, MD;  Location: [facility];  Service: Orthopedics;  Laterality: Right;  FEMORAL NERVE BLOCK   POLYPECTOMY  01/21/2020   Procedure: POLYPECTOMY;  Surgeon: Sergio Dandy, MD;  Location: [facility];  Service: Endoscopy;;   TUBAL LIGATION  2004     Social History:   reports that she has been smoking cigarettes. She has a 34 pack-year smoking history. She has never used smokeless tobacco. She reports current alcohol use. She reports that she does not use drugs.   Family History:  Her family history includes Brain cancer (age of onset: 60) in her mother; Breast cancer (age of onset: 54) in her sister; Lung cancer (age of onset: 105) in her mother; Ovarian cancer (age of onset: 68) in her sister. There is no history of Colon cancer, Colon polyps, Esophageal cancer, Rectal cancer, or Stomach cancer.   Allergies Allergies  Allergen Reactions   Jasmine Oil Shortness Of Breath and Swelling   Levaquin  [Levofloxacin ] Shortness Of Breath, Itching, Swelling and Rash   Penicillins Anaphylaxis and Hives    Tolerated ceftriaxone    Aleve [Naproxen Sodium] Itching   Ceftin  [Cefuroxime Axetil] Other (See Comments)    unsure   Chlorhexidine      Unknown reaction   Depo-Provera [Medroxyprogesterone Acetate] Other (See Comments)    Severe acne   Doxycycline  Other (See Comments)    Raises blood pressure    Tramadol Other (See Comments)    Kept her up all night     Home Medications  Prior to Admission medications   Medication Sig Start Date End Date Taking? Authorizing Provider  acetaminophen  (TYLENOL ) 500 MG tablet Take 500-1,000 mg by mouth every 6 (six) hours as needed for moderate pain (pain score 4-6).   Yes [provider]  albuterol  (VENTOLIN  HFA) 108 (90 Base) MCG/ACT inhaler INHALE 2 PUFFS INTO THE LUNGS TWICE A DAY Patient taking differently: Inhale 2 puffs into the lungs 2 (two) times daily as needed for shortness of breath. 07/15/23  Yes Howard, Amber S, FNP  ALPRAZolam  (XANAX ) 1 MG tablet Take 1 tablet (1 mg total) by mouth 2 (two) times daily as needed for anxiety. 08/28/23  Yes Howard, Amber S, FNP  amLODipine  (NORVASC ) 5 MG tablet Take 1 tablet (5 mg total) by mouth daily. 03/22/23  Yes Howard, Amber S, FNP  ASPIRIN  LOW DOSE 81 MG EC tablet TAKE 1 TABLET BY MOUTH 2 TIMES DAILY. Patient taking differently: Take 81 mg by mouth in the morning and at bedtime. 03/21/21  Yes Ulyess Gammons, NP  azelastine  (ASTELIN ) 0.1 % nasal spray Place 1 spray into both nostrils 2 (two) times daily. Use in each nostril as directed Patient taking differently: Place 1 spray into both nostrils daily as needed for allergies. 07/07/21  Yes Ulyess Gammons, NP  citalopram  (CELEXA ) 40 MG tablet TAKE 1 TABLET BY MOUTH EVERY DAY 07/31/23  Yes Jenelle Mis, FNP  Fluticasone -Umeclidin-Vilant (TRELEGY ELLIPTA ) 200-62.5-25 MCG/ACT AEPB TAKE 1 PUFF BY MOUTH EVERY DAY 02/22/23  Yes Olalere, Adewale A, MD  hydrOXYzine  (ATARAX ) 25 MG tablet TAKE 0.5-1 TABLETS (12.5-25 MG TOTAL) BY MOUTH 3 (THREE) TIMES DAILY AS NEEDED FOR ANXIETY. 09/18/21  Yes Ulyess Gammons, NP   ipratropium-albuterol  (DUONEB) 0.5-2.5 (3) MG/3ML SOLN Use 1 vial by nebulization in the morning and at bedtime. 10/10/21  Yes Ulyess Gammons, NP  Magnesium  Citrate 100 MG TABS Take 1 tablet by mouth at bedtime. Patient taking differently: Take 100 mg by mouth at bedtime. 10/10/21  Yes Ulyess Gammons, NP  meclizine  (ANTIVERT ) 25 MG tablet Take 1 tablet (25 mg total) by mouth 3 (three) times daily as needed for dizziness. 12/27/20  Yes Ulyess Gammons, NP  omeprazole  (PRILOSEC) 40 MG capsule Take 1 capsule (40 mg total) by mouth 2 (  two) times daily. Take 30 mins. Prior to breakfast and supper 03/04/23  Yes Graciella Lavender, [place]  ondansetron  (ZOFRAN ) 4 MG tablet Take one tablet by mouth every 4-6 hours as needed 03/04/23  Yes Lemmon, Kathy Parker, PA  potassium chloride  SA (KLOR-CON  M) 20 MEQ tablet Take 1 tablet (20 mEq total) by mouth daily. 10/10/21  Yes Ulyess Gammons, NP  rizatriptan  (MAXALT ) 10 MG tablet TAKE 1 TABLET BY MOUTH AS NEEDED FOR MIGRAINE. MAY REPEAT IN 2 HOURS IF NEEDED STRENGTH: 10 MG 09/26/23  Yes Gwen Lek, [facility] S, FNP  rOPINIRole  (REQUIP ) 2 MG tablet TAKE 1/2 TO 1 TABLETS BY MOUTH AT BEDTIME. 02/25/23  Yes Howard, [employer], FNP  traZODone  (DESYREL ) 50 MG tablet Take 1 tablet (50 mg total) by mouth at bedtime. 05/06/23  Yes Howard, Amber S, FNP  EPINEPHrine  (EPIPEN  2-PAK) 0.3 mg/0.3 mL IJ SOAJ injection Inject 0.3 mg into the muscle as needed for anaphylaxis. Patient not taking: Reported on 10/08/2023 12/27/20   Ulyess Gammons, NP  metoCLOPramide  (REGLAN ) 10 MG tablet Take 1 tablet (10 mg total) by mouth 3 (three) times daily with meals. Patient not taking: Reported on 10/08/2023 12/27/22 01/26/23  Haydee Lipa, MD  rosuvastatin  (CRESTOR ) 10 MG tablet Take 1 tablet (10 mg total) by mouth daily. Patient not taking: Reported on 10/08/2023 05/01/23   Jenelle Mis, FNP     Critical care time: 18   The patient is critically ill with multiple organ system failure and  requires high complexity decision making for assessment and support, frequent evaluation and titration of therapies, advanced monitoring, review of radiographic studies and interpretation of complex data.   Critical Care Time devoted to patient care services, exclusive of separately billable procedures, described in this note is 32 minutes.   Claven Cumming, MD  Pulmonary & Critical care See Amion for pager  If no response to pager , please call [phone] until 7pm After 7:00 pm call Elink  [phone] 10/08/2023, 10:27 PM

## 2023-10-08 NOTE — Subjective & Objective (Signed)
 Reports diarrhea for the past 2 days with low back pain has known history of diverticulitis and C. difficile no associated fevers. CT showed marked severity sigmoid diverticulitis with perforated shin and abscess lactic acid 8.4 New liver lesion also found

## 2023-10-08 NOTE — ED Provider Triage Note (Addendum)
 Emergency Medicine Provider Triage Evaluation Note  Vicki [name] , a 58 y.o. female  was evaluated in triage.  Pt complains of lower abdominal pain and low back pain, feels like prior diverticulitis and C. difficile, having severe sharp pain followed by uncontrollable diarrhea all day today..  Review of Systems  Positive: Abdominal pain, diarrhea Negative: Fever  Physical Exam  BP (!) 100/58 (BP Location: Left Arm)   Pulse (!) 119   Temp 97.9 F (36.6 C)   Resp (!) 25   SpO2 100%  Gen:   Awake, appears uncomfortable Resp:  Normal effort  MSK:   Moves extremities without difficulty  Other:    Medical Decision Making  Medically screening exam initiated at 4:34 PM.  Appropriate orders placed.  Alira Fretwell was informed that the remainder of the evaluation will be completed by another provider, this initial triage assessment does not replace that evaluation, and the importance of remaining in the ED until their evaluation is complete.     Aimee Houseman, PA-C 10/08/23 1636    Aimee Houseman, PA-C 10/08/23 1638

## 2023-10-08 NOTE — ED Notes (Signed)
CCMD notified

## 2023-10-08 NOTE — ED Notes (Signed)
 Pt reports increase in intermittent lower ABD pain. Appears uncomfortable, rocking side to side.

## 2023-10-08 NOTE — Telephone Encounter (Signed)
 Copied from CRM [id number]. Topic: Clinical - Red Word Triage >> Oct 08, 2023  2:52 PM Fredrica W wrote: Red Word that prompted transfer to Nurse Triage: Pain abdomen - diverticulitis flare   Chief Complaint: abdominal pian, diarrhea, difficulty breathing (worse than normal) Symptoms: nausea, diarrhea, vomiting, difficulty breathing (worse than normal) Frequency: a few days Pertinent Negatives: Patient denies fever Disposition: [x] ED /[] Urgent Care (no appt availability in office) / [] Appointment(In office/virtual)/ []  [facility]/ [] Home Care/ [] Refused Recommended Disposition /[] [facility]/ []  Follow-up with PCP Additional Notes: Patient's husband, Forestine Igo, called and is with the patient at this time. He advised that the patient has been hurting in her abdomen for the past few days on the right side of her abdomen and they stated that this is where she has experienced pain in the past when having flare ups of diverticulitis. Patient is in the background answering questions and sounds very unwell over the phone. Patient states that she has had a lot of diarrhea and some vomiting. Patient is also on home oxygen and she admits to her difficulty breathing being worse over the past few days as well. They did deny her having any fevers at this time. With patient being in severe pain, 10 out of 10 she rates her pain, it is recommended for the patient to go to the Emergency Room. He is also advised that with her difficulty breathing worse than it normally is at baseline for the past few days is another concern for evaluation in the Emergency Room. The husband is advised that if anything worsens on the way they can call 911 at any point. Patient's husband verbalized understanding.  Reason for Disposition  [1] SEVERE pain (e.g., excruciating) AND [2] present > 1 hour  Answer Assessment - Initial Assessment Questions 1. LOCATION: "Where does it hurt?"      Right side of her  abdomen---same area where she has had issues before 2. RADIATION: "Does the pain shoot anywhere else?" (e.g., chest, back)     Up her right side 3. ONSET: "When did the pain begin?" (e.g., minutes, hours or days ago)      A few days ago 4. SUDDEN: "Gradual or sudden onset?"     Getting worse 5. PATTERN "Does the pain come and go, or is it constant?"    - If it comes and goes: "How long does it last?" "Do you have pain now?"     (Note: Comes and goes means the pain is intermittent. It goes away completely between bouts.)    - If constant: "Is it getting better, staying the same, or getting worse?"      (Note: Constant means the pain never goes away completely; most serious pain is constant and gets worse.)      constant 6. SEVERITY: "How bad is the pain?"  (e.g., Scale 1-10; mild, moderate, or severe)    - MILD (1-3): Doesn't interfere with normal activities, abdomen soft and not tender to touch.     - MODERATE (4-7): Interferes with normal activities or awakens from sleep, abdomen tender to touch.     - SEVERE (8-10): Excruciating pain, doubled over, unable to do any normal activities.       10 7. RECURRENT SYMPTOM: "Have you ever had this type of stomach pain before?" If Yes, ask: "When was the last time?" and "What happened that time?"      ---been in hospital before 8. CAUSE: "What do you think is causing  the stomach pain?"     "Diverticulitis" 9. RELIEVING/AGGRAVATING FACTORS: "What makes it better or worse?" (e.g., antacids, bending or twisting motion, bowel movement)     ------ 10. OTHER SYMPTOMS: "Do you have any other symptoms?" (e.g., back pain, diarrhea, fever, urination pain, vomiting)       Diarrhea, difficulty breathing a little worse than normal  Protocols used: Abdominal Pain - Female-A-AH

## 2023-10-08 NOTE — ED Provider Notes (Signed)
 [place] EMERGENCY DEPARTMENT AT [facility] Provider Note   CSN: [id number] Arrival date & time: 10/08/23  1556     History  No chief complaint on file.   Vicki [name] is a 58 y.o. female.  The history is provided by the patient, medical records and a significant other. No language interpreter was used.  Abdominal Pain Pain location:  Generalized Pain quality: aching and cramping   Pain radiates to:  Does not radiate Pain severity:  Severe Onset quality:  Gradual Duration:  3 days Timing:  Constant Progression:  Worsening Chronicity:  New Context: not trauma   Relieved by:  Nothing Worsened by:  Palpation Ineffective treatments:  None tried Associated symptoms: chest pain, chills, cough, diarrhea, fatigue, nausea and vomiting   Associated symptoms: no constipation, no dysuria, no fever and no shortness of breath        Home Medications Prior to Admission medications   Medication Sig Start Date End Date Taking? Authorizing Provider  acetaminophen  (TYLENOL ) 500 MG tablet Take 500 mg by mouth every 6 (six) hours as needed for moderate pain.    [provider]  albuterol  (VENTOLIN  HFA) 108 (90 Base) MCG/ACT inhaler INHALE 2 PUFFS INTO THE LUNGS TWICE A DAY 07/15/23   Jenelle Mis, FNP  ALPRAZolam  (XANAX ) 1 MG tablet Take 1 tablet (1 mg total) by mouth 2 (two) times daily as needed for anxiety. 08/28/23   Jenelle Mis, FNP  amLODipine  (NORVASC ) 5 MG tablet Take 1 tablet (5 mg total) by mouth daily. 03/22/23   Yolanda Hence S, FNP  ASPIRIN  LOW DOSE 81 MG EC tablet TAKE 1 TABLET BY MOUTH 2 TIMES DAILY. Patient taking differently: Take 81 mg by mouth in the morning and at bedtime. 03/21/21   Ulyess Gammons, NP  azelastine  (ASTELIN ) 0.1 % nasal spray Place 1 spray into both nostrils 2 (two) times daily. Use in each nostril as directed Patient taking differently: Place 1 spray into both nostrils daily. 07/07/21   Ulyess Gammons, NP  citalopram   (CELEXA ) 40 MG tablet TAKE 1 TABLET BY MOUTH EVERY DAY 07/31/23   Jenelle Mis, FNP  EPINEPHrine  (EPIPEN  2-PAK) 0.3 mg/0.3 mL IJ SOAJ injection Inject 0.3 mg into the muscle as needed for anaphylaxis. 12/27/20   Ulyess Gammons, NP  Fluticasone -Umeclidin-Vilant (TRELEGY ELLIPTA ) 200-62.5-25 MCG/ACT AEPB TAKE 1 PUFF BY MOUTH EVERY DAY 02/22/23   Olalere, Adewale A, MD  hydrOXYzine  (ATARAX ) 25 MG tablet TAKE 0.5-1 TABLETS (12.5-25 MG TOTAL) BY MOUTH 3 (THREE) TIMES DAILY AS NEEDED FOR ANXIETY. 09/18/21   Ulyess Gammons, NP  ipratropium-albuterol  (DUONEB) 0.5-2.5 (3) MG/3ML SOLN Use 1 vial by nebulization in the morning and at bedtime. 10/10/21   Ulyess Gammons, NP  Magnesium  Citrate 100 MG TABS Take 1 tablet by mouth at bedtime. Patient taking differently: Take 100 mg by mouth at bedtime. 10/10/21   Ulyess Gammons, NP  meclizine  (ANTIVERT ) 25 MG tablet Take 1 tablet (25 mg total) by mouth 3 (three) times daily as needed for dizziness. 12/27/20   Ulyess Gammons, NP  metoCLOPramide  (REGLAN ) 10 MG tablet Take 1 tablet (10 mg total) by mouth 3 (three) times daily with meals. 12/27/22 01/26/23  Haydee Lipa, MD  omeprazole  (PRILOSEC) 40 MG capsule Take 1 capsule (40 mg total) by mouth 2 (two) times daily. Take 30 mins. Prior to breakfast and supper 03/04/23   Graciella Lavender, PA  ondansetron  (ZOFRAN ) 4 MG tablet Take one tablet by mouth every 4-6 hours as  needed 03/04/23   Graciella Lavender, PA  potassium chloride  SA (KLOR-CON  M) 20 MEQ tablet Take 1 tablet (20 mEq total) by mouth daily. 10/10/21   Ulyess Gammons, NP  rizatriptan  (MAXALT ) 10 MG tablet TAKE 1 TABLET BY MOUTH AS NEEDED FOR MIGRAINE. MAY REPEAT IN 2 HOURS IF NEEDED STRENGTH: 10 MG 09/26/23   Jenelle Mis, FNP  rOPINIRole  (REQUIP ) 2 MG tablet TAKE 1/2 TO 1 TABLETS BY MOUTH AT BEDTIME. 02/25/23   Jenelle Mis, FNP  rosuvastatin  (CRESTOR ) 10 MG tablet Take 1 tablet (10 mg total) by mouth daily. 05/01/23   Jenelle Mis, FNP   traZODone  (DESYREL ) 50 MG tablet Take 1 tablet (50 mg total) by mouth at bedtime. 05/06/23   Jenelle Mis, FNP      Allergies    Jasmine oil, Levaquin  [levofloxacin ], Penicillins, Aleve [naproxen sodium], Ceftin [cefuroxime axetil], Chlorhexidine , Depo-provera [medroxyprogesterone acetate], Doxycycline , and Tramadol    Review of Systems   Review of Systems  Constitutional:  Positive for chills and fatigue. Negative for fever.  HENT:  Negative for congestion.   Respiratory:  Positive for cough. Negative for chest tightness, shortness of breath and wheezing.   Cardiovascular:  Positive for chest pain. Negative for palpitations and leg swelling.  Gastrointestinal:  Positive for abdominal pain, diarrhea, nausea and vomiting. Negative for constipation.  Genitourinary:  Positive for decreased urine volume and difficulty urinating. Negative for dysuria and flank pain.  Musculoskeletal:  Positive for back pain (low). Negative for neck pain and neck stiffness.  Skin:  Negative for rash and wound.  Neurological:  Negative for headaches.  Psychiatric/Behavioral:  Negative for agitation and confusion.   All other systems reviewed and are negative.   Physical Exam Updated Vital Signs BP (!) 100/58 (BP Location: Left Arm)   Pulse (!) 119   Temp 97.9 F (36.6 C)   Resp (!) 25   SpO2 100%  Physical Exam Vitals and nursing note reviewed.  Constitutional:      General: She is not in acute distress.    Appearance: She is well-developed. She is ill-appearing. She is not toxic-appearing or diaphoretic.  HENT:     Head: Normocephalic and atraumatic.     Nose: No congestion or rhinorrhea.     Mouth/Throat:     Mouth: Mucous membranes are dry.     Pharynx: No oropharyngeal exudate or posterior oropharyngeal erythema.  Eyes:     Extraocular Movements: Extraocular movements intact.     Conjunctiva/sclera: Conjunctivae normal.     Pupils: Pupils are equal, round, and reactive to light.   Cardiovascular:     Rate and Rhythm: Regular rhythm. Tachycardia present.     Heart sounds: No murmur heard. Pulmonary:     Effort: Pulmonary effort is normal. No respiratory distress.     Breath sounds: Normal breath sounds. No wheezing, rhonchi or rales.  Chest:     Chest wall: No tenderness.  Abdominal:     General: Abdomen is flat.     Palpations: Abdomen is soft.     Tenderness: There is abdominal tenderness. There is guarding. There is no right CVA tenderness or left CVA tenderness.  Musculoskeletal:        General: Tenderness present. No swelling.     Cervical back: Neck supple. No tenderness.     Right lower leg: No edema.     Left lower leg: No edema.  Skin:    General: Skin is warm and dry.  Capillary Refill: Capillary refill takes less than 2 seconds.     Findings: No erythema or rash.  Neurological:     General: No focal deficit present.     Mental Status: She is alert.     Sensory: No sensory deficit.     Motor: No weakness.  Psychiatric:        Mood and Affect: Mood normal.     ED Results / Procedures / Treatments   Labs (all labs ordered are listed, but only abnormal results are displayed) Labs Reviewed  COMPREHENSIVE METABOLIC PANEL WITH GFR - Abnormal; Notable for the following components:      Result Value   Potassium 3.2 (*)    Chloride 95 (*)    Glucose, Bld 133 (*)    BUN <5 (*)    Calcium  7.9 (*)    Albumin 3.4 (*)    AST 61 (*)    All other components within normal limits  CBC WITH DIFFERENTIAL/PLATELET - Abnormal; Notable for the following components:   WBC 11.3 (*)    Neutro Abs 9.4 (*)    All other components within normal limits  URINALYSIS, W/ REFLEX TO CULTURE (INFECTION SUSPECTED) - Abnormal; Notable for the following components:   Color, Urine AMBER (*)    APPearance HAZY (*)    Specific Gravity, Urine 1.032 (*)    Ketones, ur 5 (*)    Protein, ur 30 (*)    Bacteria, UA RARE (*)    All other components within normal limits   I-STAT CG4 LACTIC ACID, ED - Abnormal; Notable for the following components:   Lactic Acid, Venous 2.9 (*)    All other components within normal limits  I-STAT CG4 LACTIC ACID, ED - Abnormal; Notable for the following components:   Lactic Acid, Venous 8.7 (*)    All other components within normal limits  RESP PANEL BY RT-PCR (RSV, FLU A&B, COVID)  RVPGX2  CULTURE, BLOOD (ROUTINE X 2)  CULTURE, BLOOD (ROUTINE X 2)  C DIFFICILE QUICK SCREEN W PCR REFLEX    PROTIME-INR  HIV ANTIBODY (ROUTINE TESTING W REFLEX)  CBC  COMPREHENSIVE METABOLIC PANEL WITH GFR  TROPONIN I (HIGH SENSITIVITY)  TROPONIN I (HIGH SENSITIVITY)    EKG EKG Interpretation Date/Time:  Tuesday Oct 08 2023 17:39:39 EDT Ventricular Rate:  109 PR Interval:  152 QRS Duration:  112 QT Interval:  351 QTC Calculation: 469 R Axis:   68  Text Interpretation: Sinus tachycardia Probable left atrial enlargement Borderline intraventricular conduction delay Low voltage, precordial leads when compared to prior, more artifact and wandering baseline No STEMI Confirmed by Wynell Heath ([id number]) on 10/08/2023 6:48:28 PM  Radiology CT ABDOMEN PELVIS W CONTRAST Result Date: 10/08/2023 CLINICAL DATA:  Left lower quadrant pain. EXAM: CT ABDOMEN AND PELVIS WITH CONTRAST TECHNIQUE: Multidetector CT imaging of the abdomen and pelvis was performed using the standard protocol following bolus administration of intravenous contrast. RADIATION DOSE REDUCTION: This exam was performed according to the departmental dose-optimization program which includes automated exposure control, adjustment of the mA and/or kV according to patient size and/or use of iterative reconstruction technique. CONTRAST:  75mL OMNIPAQUE  IOHEXOL  350 MG/ML SOLN COMPARISON:  December 22, 2022 FINDINGS: Lower chest: Mild areas of lingular, right middle lobe and bibasilar linear scarring and/or atelectasis are seen. Hepatobiliary: There is diffuse fatty infiltration of the liver  parenchyma. A 1.7 cm x 2.1 cm x 3.0 cm isodense liver lesion is seen within segment IV of the right lobe of the liver (  approximately 45.15 Hounsfield units). The parenchyma throughout the remainder of the liver measures approximately 54.93 Hounsfield units. This area is surrounded by a very thin hyperdense rim and is not clearly visualized on the prior study. Status post cholecystectomy. No biliary dilatation. Pancreas: Unremarkable. No pancreatic ductal dilatation or surrounding inflammatory changes. Spleen: Normal in size without focal abnormality. Adrenals/Urinary Tract: Adrenal glands are unremarkable. Kidneys are normal, without renal calculi, focal lesion, or hydronephrosis. Bladder is unremarkable. Stomach/Bowel: Stomach is within normal limits. The appendix is surgically absent. No evidence of bowel dilatation. Numerous diverticula are seen throughout the descending and sigmoid colon. The mid sigmoid colon is also markedly thickened and inflamed. Mildly inflamed loops of adjacent distal ileum are seen. There is no evidence of associated perforation or abscess. Vascular/Lymphatic: Aortic atherosclerosis. No enlarged abdominal or pelvic lymph nodes. Reproductive: Status post hysterectomy. No adnexal masses. Other: No abdominal wall hernia or abnormality. There is a small amount of pelvic free fluid. Musculoskeletal: Degenerative changes are seen within the lower lumbar spine. IMPRESSION: 1. Marked severity sigmoid diverticulitis without associated perforation or abscess. 2. Mildly inflamed loops of adjacent distal ileum. 3. Hepatic steatosis. 4. Isodense liver lesion, as described above, which represents a new finding when compared to the prior study. Nonemergent MRI follow-up is recommended to further exclude the presence of an underlying neoplastic process. 5. Evidence of prior cholecystectomy, appendectomy and hysterectomy. 6. Aortic atherosclerosis. Electronically Signed   By: Virgle Grime M.D.   On:  10/08/2023 20:45   DG Chest Port 1 View Result Date: 10/08/2023 CLINICAL DATA:  Questionable sepsis - evaluate for abnormality Abdominal and back pain. EXAM: PORTABLE CHEST 1 VIEW COMPARISON:  Chest CT 07/11/2022 FINDINGS: Chronic hyperinflation and bronchial thickening. No focal airspace disease. The heart is normal in size. No pulmonary edema, pleural effusion, or pneumothorax. No acute osseous findings IMPRESSION: Chronic hyperinflation and bronchial thickening. No acute findings. Electronically Signed   By: Chadwick Colonel M.D.   On: 10/08/2023 18:22    Procedures Procedures    CRITICAL CARE Performed by: Marine Sia Dempsey Knotek Total critical care time: 45 minutes Critical care time was exclusive of separately billable procedures and treating other patients. Critical care was necessary to treat or prevent imminent or life-threatening deterioration. Critical care was time spent personally by me on the following activities: development of treatment plan with patient and/or surrogate as well as nursing, discussions with consultants, evaluation of patient's response to treatment, examination of patient, obtaining history from patient or surrogate, ordering and performing treatments and interventions, ordering and review of laboratory studies, ordering and review of radiographic studies, pulse oximetry and re-evaluation of patient's condition.  Medications Ordered in ED Medications  lactated ringers  infusion ( Intravenous New Bag/Given 10/08/23 1907)  fentaNYL  (SUBLIMAZE ) injection 50 mcg (50 mcg Intravenous Not Given 10/08/23 1857)  acetaminophen  (TYLENOL ) tablet 650 mg (650 mg Oral Given 10/08/23 1904)  morphine  (PF) 4 MG/ML injection 4 mg (has no administration in time range)  lactated ringers  bolus 1,000 mL (0 mLs Intravenous Stopped 10/08/23 1815)  ondansetron  (ZOFRAN ) injection 4 mg (4 mg Intravenous Given 10/08/23 1738)  fentaNYL  (SUBLIMAZE ) injection 50 mcg (50 mcg Intravenous Given  10/08/23 1738)  lactated ringers  bolus 1,000 mL (0 mLs Intravenous Stopped 10/08/23 1812)    And  lactated ringers  bolus 500 mL (0 mLs Intravenous Stopped 10/08/23 1929)  aztreonam (AZACTAM) 2 g in sodium chloride  0.9 % 100 mL IVPB (0 g Intravenous Stopped 10/08/23 1812)  metroNIDAZOLE  (FLAGYL ) IVPB 500 mg (0 mg Intravenous  Stopped 10/08/23 1854)  vancomycin  (VANCOCIN ) IVPB 1000 mg/200 mL premix (0 mg Intravenous Stopped 10/08/23 1954)  morphine  (PF) 4 MG/ML injection 4 mg (4 mg Intravenous Given 10/08/23 1904)  iohexol  (OMNIPAQUE ) 350 MG/ML injection 75 mL (75 mLs Intravenous Contrast Given 10/08/23 2019)  promethazine  (PHENERGAN ) 25 mg in sodium chloride  0.9 % 50 mL IVPB (0 mg Intravenous Stopped 10/08/23 1956)    ED Course/ Medical Decision Making/ A&P                                 Medical Decision Making Risk OTC drugs. Prescription drug management. Decision regarding hospitalization.    Vicki [name] is a 58 y.o. female with a past medical history significant for COPD with home oxygen requirement, asthma, anxiety, depression, previous diverticulitis and previous C. difficile colitis who presents with chills, fatigue, nausea, vomiting, diarrhea, decreased urine, and abdominal pain.  According to patient, over the last week she has had diarrhea again that is extremely frequent and watery.  She denies any blood in her stools.  She reports the last several days she has had worsening abdominal pain that feels like when she had diverticulitis and C. difficile.  She reports some chills but no documented fevers.  She reports some fatigue.  She reports no shortness of breath does have some mild chest discomfort with all the vomiting and some cough.  She reports she has had severe abdominal pain all of her abdomen and she is worried about.  She was clutching and rolling around on the bed in severe pain.  She reports some low back soreness as well.  Denies any head trauma but does work she fell  the other day but did not hit her abdomen.  Reports she fell on her right arm but does not have any arm complaints at this time.  Denies any other complaints.  On exam, patient is ill-appearing.  Chest is nontender.  Lungs had some coarseness in the bases.  Bowel sounds were appreciated but abdomen is extremely tender all over.  Mild tenderness across her lower back.  Midline nontender.  She did have sensation and strength and pulses in extremities.  Patient very warm to the touch.  Although her oral temperature was normal in triage, I am concerned about fever.  Will get a rectal temperature.  With her tachycardia, tachypnea, soft pressure, chills, and ill appearance I will activate a code sepsis.  Her lactic acid from triage did return elevated as did her white blood cell count.  Will order broad-spectrum antibiotics that avoid penicillins and Levaquin  based on her allergies.  She will get screening labs otherwise we will give her pain medicine, nausea medicine, and fluids.  Will get the CT and pelvis as ordered from triage.  Will get chest x-ray with a cough.  She will be admitted for further management after workup is completed.  8:42 PM CT was obtained and I looked at the pictures and a concerned about diverticulitis.  I called radiology and spoke to them myself and they agree with severe diverticulitis but no clear abscess, perforation, free air, or obstruction.  Lactic acid rose to 8.7 from 2.9 which is concerning.  This is despite getting fluids for her code sepsis.  I went to reassess the patient and her blood pressure still around 100 systolic and she is screaming in pain.  She would not let me barely touch her abdomen.  Will call  general surgery although I do not see an acute surgical problem now I am still worried about her bowel.  Will call critical care given the rise in lactic acid and her soft pressures with the sepsis.  9:30 PM Critical care was kind of to come evaluate patient and  feels that now that her pain is starting to improve with medications she is safe for a stepdown unit and not ICU level care at this time.  Will call medicine for admission.        Final Clinical Impression(s) / ED Diagnoses Final diagnoses:  Sepsis, due to unspecified organism, unspecified whether acute organ dysfunction present [facility])  Diverticulitis  Continuous severe abdominal pain    Clinical Impression: 1. Sepsis, due to unspecified organism, unspecified whether acute organ dysfunction present (HCC)   2. Diverticulitis   3. Continuous severe abdominal pain     Disposition: Admit  This note was prepared with assistance of Dragon voice recognition software. Occasional wrong-word or sound-a-like substitutions may have occurred due to the inherent limitations of voice recognition software.      Dayna Geurts, Marine Sia, MD 10/09/23 [phone]

## 2023-10-08 NOTE — ED Notes (Signed)
 Provider aware of temp. Awaiting lab results before treatment.

## 2023-10-08 NOTE — ED Notes (Signed)
 Floor made aware that patient is coming upstairs.

## 2023-10-08 NOTE — Sepsis Progress Note (Signed)
 Sepsis protocol monitored by eLink ?

## 2023-10-08 NOTE — ED Notes (Signed)
 Lab to add on trop to existing blood.

## 2023-10-08 NOTE — ED Triage Notes (Signed)
 Pt c/o pelvic and lower back pain, diarrhea x 2 days; hx diverticulitis and c diff; denies fevers; states she feels like she can't urinate, states unable to urinate today

## 2023-10-08 NOTE — H&P (Signed)
 History and Physical    Patient: Vicki [name] BJY:[id number] DOB: [birth year] DOA: 10/08/2023 DOS: the patient was seen and examined on 10/08/2023 PCP: Jenelle Mis, FNP  Patient coming from: Home  Chief Complaint: No chief complaint on file.  HPI: Vicki [name] is a 58 y.o. female with medical history significant of hypertension, hyperlipidemia, chronic respiratory failure on 4 L of oxygen, history of C. difficile, and anxiety presents with severe abdominal.  She experiences severe pain in her pelvis and abdomen, primarily on the right side, radiating to her back. The pain is described as stabbing or like twisted intestines. This episode has lasted about a week, beginning with diarrhea and mucus discharge from the rectum, but no blood. The pain becomes so intense that it feels that she cannot breathe.  This is her fifth hospital visit for similar symptoms. She has a history of two hernias, one on the right side and another that is difficult to locate. The last episode resulted in bruising when the hernia protruded.  She is normally on four liters of oxygen at home and has a history of smoking, though she currently only takes a few puffs from a cigarette occasionally.  In the emergency department patient was noted to be febrile up to 102.5 F with tachycardia and tachypnea meeting SIRS criteria.  Labs significant for WBC 11.3, potassium 3.2, calcium  7.9, PMN 3.4, AST 61, high-sensitivity troponins negative x 2, and lactic acid 2.9->8.7.  Urinalysis without significant signs for infection.  Blood cultures were obtained.  CT scan of the abdomen and pelvis noted moderate severity sigmoid diverticulitis without associated perforation or abscess, mildly inflamed loops of adjacent distal ileum, hepatic steatosis, and isodense liver lesion.  Due to clinical concern PCCM and general surgery had been consulted.  Patient had been given 2.5 L of lactated Ringer 's, acetaminophen  650 mg p.o.,  morphine , fentanyl  IV, vancomycin , metronidazole , and aztreonam.   Review of Systems: As mentioned in the history of present illness. All other systems reviewed and are negative. Past Medical History:  Diagnosis Date   Allergy    seasonal allergies   Anxiety    on meds   Arthritis    hands   Asthma    childhood-current   CAP (community acquired pneumonia) 04/07/2019   Carpal tunnel syndrome    had bilateral sx to tx   Cataract    bilateral--no sx yet   COPD (chronic obstructive pulmonary disease) (HCC)    Depression    on meds   Diverticulitis    Emphysema of lung (HCC)    uses MDI   Essential hypertension, benign 07/14/2019   on meds   Hyperlipidemia    on meds   Oxygen deficiency    on 5 L cont [place]   Oxygen dependent    on 5 L cont [place]   Past Surgical History:  Procedure Laterality Date   ABDOMINAL HYSTERECTOMY  2009   APPENDECTOMY     ARTHROSCOPIC REPAIR ACL Left 2011/2014   x 2   BIOPSY  01/21/2020   Procedure: BIOPSY;  Surgeon: Sergio Dandy, MD;  Location: [facility];  Service: Endoscopy;;  EGD and COLON   BUNIONECTOMY  2006   both   CARPAL TUNNEL RELEASE Bilateral 10/28/2013   Procedure: BILATERAL CARPAL TUNNEL RELEASE;  Surgeon: Kemp Patter, MD;  Location: [facility];  Service: Orthopedics;  Laterality: Bilateral;   CHOLECYSTECTOMY  1994   CHONDROPLASTY Right 08/31/2020   Procedure: CHONDROPLASTY;  Surgeon: Marland Silvas,  Bearl Limes, MD;  Location: [facility];  Service: Orthopedics;  Laterality: Right;   COLONOSCOPY WITH PROPOFOL  N/A 01/21/2020   Procedure: COLONOSCOPY WITH PROPOFOL ;  Surgeon: Sergio Dandy, MD;  Location: [facility];  Service: Endoscopy;  Laterality: N/A;   DILATION AND CURETTAGE OF UTERUS     ESOPHAGOGASTRODUODENOSCOPY (EGD) WITH PROPOFOL  N/A 01/21/2020   Procedure: ESOPHAGOGASTRODUODENOSCOPY (EGD) WITH PROPOFOL ;  Surgeon: Sergio Dandy, MD;  Location: [facility];  Service: Endoscopy;  Laterality: N/A;    KNEE ARTHROSCOPY WITH ANTERIOR CRUCIATE LIGAMENT (ACL) REPAIR Right 08/31/2020   Procedure: RIGHT KNEE ARTHROSCOPY WITH ANTERIOR CRUCIATE LIGAMENT (ACL) REPAIR MEDIAL AND LATERAL  MENISECTOMY;  Surgeon: Marlena Sima, MD;  Location: [facility];  Service: Orthopedics;  Laterality: Right;  FEMORAL NERVE BLOCK   POLYPECTOMY  01/21/2020   Procedure: POLYPECTOMY;  Surgeon: Sergio Dandy, MD;  Location: [facility];  Service: Endoscopy;;   TUBAL LIGATION  2004   Social History:  reports that she has been smoking cigarettes. She has a 34 pack-year smoking history. She has never used smokeless tobacco. She reports current alcohol use. She reports that she does not use drugs.  Allergies  Allergen Reactions   Jasmine Oil Shortness Of Breath and Swelling   Levaquin  [Levofloxacin ] Shortness Of Breath, Itching, Swelling and Rash   Penicillins Anaphylaxis and Hives    Tolerated ceftriaxone    Aleve [Naproxen Sodium] Itching   Ceftin [Cefuroxime Axetil] Other (See Comments)    unsure   Chlorhexidine      Unknown reaction   Depo-Provera [Medroxyprogesterone Acetate] Other (See Comments)    Severe acne   Doxycycline  Other (See Comments)    Raises blood pressure    Tramadol Other (See Comments)    Kept her up all night    Family History  Problem Relation Age of Onset   Brain cancer Mother 58   Lung cancer Mother 74   Breast cancer Sister 31   Ovarian cancer Sister 81   Colon cancer Neg Hx    Colon polyps Neg Hx    Esophageal cancer Neg Hx    Rectal cancer Neg Hx    Stomach cancer Neg Hx     Prior to Admission medications   Medication Sig Start Date End Date Taking? Authorizing Provider  acetaminophen  (TYLENOL ) 500 MG tablet Take 500-1,000 mg by mouth every 6 (six) hours as needed for moderate pain (pain score 4-6).   Yes [provider]  albuterol  (VENTOLIN  HFA) 108 (90 Base) MCG/ACT inhaler INHALE 2 PUFFS INTO THE LUNGS TWICE A DAY Patient taking differently:  Inhale 2 puffs into the lungs 2 (two) times daily as needed for shortness of breath. 07/15/23  Yes Howard, Amber S, FNP  ALPRAZolam  (XANAX ) 1 MG tablet Take 1 tablet (1 mg total) by mouth 2 (two) times daily as needed for anxiety. 08/28/23  Yes Howard, Amber S, FNP  amLODipine  (NORVASC ) 5 MG tablet Take 1 tablet (5 mg total) by mouth daily. 03/22/23  Yes Howard, Amber S, FNP  ASPIRIN  LOW DOSE 81 MG EC tablet TAKE 1 TABLET BY MOUTH 2 TIMES DAILY. Patient taking differently: Take 81 mg by mouth in the morning and at bedtime. 03/21/21  Yes Ulyess Gammons, NP  azelastine  (ASTELIN ) 0.1 % nasal spray Place 1 spray into both nostrils 2 (two) times daily. Use in each nostril as directed Patient taking differently: Place 1 spray into both nostrils daily as needed for allergies. 07/07/21  Yes Ulyess Gammons, NP  citalopram  (CELEXA ) 40 MG  tablet TAKE 1 TABLET BY MOUTH EVERY DAY 07/31/23  Yes Jenelle Mis, FNP  Fluticasone -Umeclidin-Vilant (TRELEGY ELLIPTA ) 200-62.5-25 MCG/ACT AEPB TAKE 1 PUFF BY MOUTH EVERY DAY 02/22/23  Yes Olalere, Adewale A, MD  hydrOXYzine  (ATARAX ) 25 MG tablet TAKE 0.5-1 TABLETS (12.5-25 MG TOTAL) BY MOUTH 3 (THREE) TIMES DAILY AS NEEDED FOR ANXIETY. 09/18/21  Yes Ulyess Gammons, NP  ipratropium-albuterol  (DUONEB) 0.5-2.5 (3) MG/3ML SOLN Use 1 vial by nebulization in the morning and at bedtime. 10/10/21  Yes Ulyess Gammons, NP  Magnesium  Citrate 100 MG TABS Take 1 tablet by mouth at bedtime. Patient taking differently: Take 100 mg by mouth at bedtime. 10/10/21  Yes Ulyess Gammons, NP  meclizine  (ANTIVERT ) 25 MG tablet Take 1 tablet (25 mg total) by mouth 3 (three) times daily as needed for dizziness. 12/27/20  Yes Ulyess Gammons, NP  omeprazole  (PRILOSEC) 40 MG capsule Take 1 capsule (40 mg total) by mouth 2 (two) times daily. Take 30 mins. Prior to breakfast and supper 03/04/23  Yes Graciella Lavender, [place]  ondansetron  (ZOFRAN ) 4 MG tablet Take one tablet by mouth every 4-6 hours as needed  03/04/23  Yes Lemmon, Kathy Parker, PA  potassium chloride  SA (KLOR-CON  M) 20 MEQ tablet Take 1 tablet (20 mEq total) by mouth daily. 10/10/21  Yes Ulyess Gammons, NP  rizatriptan  (MAXALT ) 10 MG tablet TAKE 1 TABLET BY MOUTH AS NEEDED FOR MIGRAINE. MAY REPEAT IN 2 HOURS IF NEEDED STRENGTH: 10 MG 09/26/23  Yes Gwen Lek, [facility] S, FNP  rOPINIRole  (REQUIP ) 2 MG tablet TAKE 1/2 TO 1 TABLETS BY MOUTH AT BEDTIME. 02/25/23  Yes Howard, [employer], FNP  traZODone  (DESYREL ) 50 MG tablet Take 1 tablet (50 mg total) by mouth at bedtime. 05/06/23  Yes Howard, Amber S, FNP  EPINEPHrine  (EPIPEN  2-PAK) 0.3 mg/0.3 mL IJ SOAJ injection Inject 0.3 mg into the muscle as needed for anaphylaxis. Patient not taking: Reported on 10/08/2023 12/27/20   Ulyess Gammons, NP  metoCLOPramide  (REGLAN ) 10 MG tablet Take 1 tablet (10 mg total) by mouth 3 (three) times daily with meals. Patient not taking: Reported on 10/08/2023 12/27/22 01/26/23  Haydee Lipa, MD  rosuvastatin  (CRESTOR ) 10 MG tablet Take 1 tablet (10 mg total) by mouth daily. Patient not taking: Reported on 10/08/2023 05/01/23   Jenelle Mis, FNP    Physical Exam: Vitals:   10/08/23 1830 10/08/23 1930 10/08/23 2000 10/08/23 2100  BP: 129/89 115/78 104/72 110/79  Pulse: 96 (!) 110 (!) 110 (!) 113  Resp: 16 (!) 22 (!) 21 18  Temp:  (!) 100.8 F (38.2 C)    TempSrc:      SpO2: 100% 100% 99% 97%   *** Data Reviewed: {Tip this will not be part of the note when signed- Document your independent interpretation of telemetry tracing, EKG, lab, Radiology test or any other diagnostic tests. Add any new diagnostic test ordered today. (Optional):26781} {Results:26384}  Assessment and Plan: Severe sepsis secondary to sigmoid diverticulitis Patient presents with complaints of severe abdominal pain and diarrhea over the last week.  In the ED found to be febrile up to 102.5 F with tachycardia and tachypnea meeting SIRS criteria.  CT scan pelvis revealed marked  severity sigmoid diverticulitis without associated perforation or abscess.  Lactic acid was noted to be elevated from 2.6->8.7.  Blood cultures have been obtained.  Patient was given 2.5 L of IV fluids, vancomycin , metronidazole , and aztreonam.   - Admit to progressive bed - Follow-up blood cultures - Clear liquid diet -  Check C. difficile - Continue metronidazole .  Change aztreonam to Rocephin  - Continue lactated Ringer 's at 150 mL/h - Trend lactic acid levels - Appreciate general surgery consultative services we will follow-up for any further recommendations  Hypokalemia Acute.  Potassium noted to be 3.2. - Give 40 mEq of potassium chloride  p.o.  Chronic respiratory failure with hypoxia - Continue nasal cannula oxygen 4 L    Advance Care Planning:   Code Status: Full Code ***  Consults: ***  Family Communication: ***  Severity of Illness: {Observation/Inpatient:21159}  Author: Lena Qualia, MD 10/08/2023 10:01 PM  For on call review [email or web address].

## 2023-10-08 NOTE — ED Notes (Signed)
 Provider notified of increased lactic.

## 2023-10-09 DIAGNOSIS — J9611 Chronic respiratory failure with hypoxia: Secondary | ICD-10-CM | POA: Diagnosis not present

## 2023-10-09 DIAGNOSIS — R6521 Severe sepsis with septic shock: Secondary | ICD-10-CM

## 2023-10-09 DIAGNOSIS — K5732 Diverticulitis of large intestine without perforation or abscess without bleeding: Secondary | ICD-10-CM | POA: Diagnosis present

## 2023-10-09 DIAGNOSIS — R7401 Elevation of levels of liver transaminase levels: Secondary | ICD-10-CM | POA: Diagnosis present

## 2023-10-09 DIAGNOSIS — E876 Hypokalemia: Secondary | ICD-10-CM | POA: Diagnosis not present

## 2023-10-09 DIAGNOSIS — K5792 Diverticulitis of intestine, part unspecified, without perforation or abscess without bleeding: Secondary | ICD-10-CM | POA: Diagnosis not present

## 2023-10-09 DIAGNOSIS — E8729 Other acidosis: Secondary | ICD-10-CM

## 2023-10-09 DIAGNOSIS — F1010 Alcohol abuse, uncomplicated: Secondary | ICD-10-CM | POA: Diagnosis present

## 2023-10-09 DIAGNOSIS — A419 Sepsis, unspecified organism: Secondary | ICD-10-CM | POA: Diagnosis not present

## 2023-10-09 LAB — COMPREHENSIVE METABOLIC PANEL WITH GFR
ALT: 13 U/L (ref 0–44)
ALT: 11 U/L (ref 0–44)
AST: 45 U/L — ABNORMAL HIGH (ref 15–41)
AST: 41 U/L (ref 15–41)
Albumin: 2.4 g/dL — ABNORMAL LOW (ref 3.5–5.0)
Albumin: 2.5 g/dL — ABNORMAL LOW (ref 3.5–5.0)
Alkaline Phosphatase: 78 U/L (ref 38–126)
Alkaline Phosphatase: 74 U/L (ref 38–126)
Anion gap: 6 (ref 5–15)
Anion gap: 19 — ABNORMAL HIGH (ref 5–15)
BUN: <5 mg/dL — ABNORMAL LOW (ref 6–20)
BUN: 5 mg/dL — ABNORMAL LOW (ref 6–20)
CO2: 32 mmol/L (ref 22–32)
CO2: 16 mmol/L — ABNORMAL LOW (ref 22–32)
Calcium: 7.8 mg/dL — ABNORMAL LOW (ref 8.9–10.3)
Calcium: 7.4 mg/dL — ABNORMAL LOW (ref 8.9–10.3)
Chloride: 99 mmol/L (ref 98–111)
Chloride: 100 mmol/L (ref 98–111)
Creatinine, Ser: 0.74 mg/dL (ref 0.44–1.00)
Creatinine, Ser: 0.83 mg/dL (ref 0.44–1.00)
GFR, Estimated: >60 mL/min (ref >60)
GFR, Estimated: >60 mL/min (ref >60)
Glucose, Bld: 108 mg/dL — ABNORMAL HIGH (ref 70–99)
Glucose, Bld: 94 mg/dL (ref 70–99)
Potassium: 2.9 mmol/L — ABNORMAL LOW (ref 3.5–5.1)
Potassium: 3.3 mmol/L — ABNORMAL LOW (ref 3.5–5.1)
Sodium: 137 mmol/L (ref 135–145)
Sodium: 135 mmol/L (ref 135–145)
Total Bilirubin: 1 mg/dL (ref 0.0–1.2)
Total Bilirubin: 1.4 mg/dL — ABNORMAL HIGH (ref 0.0–1.2)
Total Protein: 5.2 g/dL — ABNORMAL LOW (ref 6.5–8.1)
Total Protein: 4.9 g/dL — ABNORMAL LOW (ref 6.5–8.1)

## 2023-10-09 LAB — CBC WITH DIFFERENTIAL/PLATELET
Abs Immature Granulocytes: 0.01 10*3/uL (ref 0.00–0.07)
Basophils Absolute: 0 10*3/uL (ref 0.0–0.1)
Basophils Relative: 0 %
Eosinophils Absolute: 0.1 10*3/uL (ref 0.0–0.5)
Eosinophils Relative: 1 %
HCT: 30.8 % — ABNORMAL LOW (ref 36.0–46.0)
Hemoglobin: 10 g/dL — ABNORMAL LOW (ref 12.0–15.0)
Immature Granulocytes: 0 %
Lymphocytes Relative: 8 %
Lymphs Abs: 0.3 10*3/uL — ABNORMAL LOW (ref 0.7–4.0)
MCH: 31.7 pg (ref 26.0–34.0)
MCHC: 32.5 g/dL (ref 30.0–36.0)
MCV: 97.8 fL (ref 80.0–100.0)
Monocytes Absolute: 0.3 10*3/uL (ref 0.1–1.0)
Monocytes Relative: 7 %
Neutro Abs: 3.8 10*3/uL (ref 1.7–7.7)
Neutrophils Relative %: 84 %
Platelets: 129 10*3/uL — ABNORMAL LOW (ref 150–400)
RBC: 3.15 MIL/uL — ABNORMAL LOW (ref 3.87–5.11)
RDW: 13.1 % (ref 11.5–15.5)
Smear Review: NORMAL
WBC: 4.5 10*3/uL (ref 4.0–10.5)
nRBC: 0 % (ref 0.0–0.2)

## 2023-10-09 LAB — MAGNESIUM
Magnesium: 0.7 mg/dL — CL (ref 1.7–2.4)
Magnesium: 2.5 mg/dL — ABNORMAL HIGH (ref 1.7–2.4)

## 2023-10-09 LAB — CBC
HCT: 32.1 % — ABNORMAL LOW (ref 36.0–46.0)
Hemoglobin: 10.4 g/dL — ABNORMAL LOW (ref 12.0–15.0)
MCH: 31.2 pg (ref 26.0–34.0)
MCHC: 32.4 g/dL (ref 30.0–36.0)
MCV: 96.4 fL (ref 80.0–100.0)
Platelets: 161 10*3/uL (ref 150–400)
RBC: 3.33 MIL/uL — ABNORMAL LOW (ref 3.87–5.11)
RDW: 13.1 % (ref 11.5–15.5)
WBC: 9.8 10*3/uL (ref 4.0–10.5)
nRBC: 0 % (ref 0.0–0.2)

## 2023-10-09 LAB — MRSA NEXT GEN BY PCR, NASAL: MRSA by PCR Next Gen: NOT DETECTED

## 2023-10-09 LAB — C DIFFICILE QUICK SCREEN W PCR REFLEX
C Diff antigen: NEGATIVE
C Diff interpretation: NOT DETECTED
C Diff toxin: NEGATIVE

## 2023-10-09 LAB — LACTIC ACID, PLASMA
Lactic Acid, Venous: 2 mmol/L (ref 0.5–1.9)
Lactic Acid, Venous: 1.6 mmol/L (ref 0.5–1.9)
Lactic Acid, Venous: 5.2 mmol/L (ref 0.5–1.9)

## 2023-10-09 LAB — HIV ANTIBODY (ROUTINE TESTING W REFLEX): HIV Screen 4th Generation wRfx: NONREACTIVE

## 2023-10-09 LAB — PROCALCITONIN: Procalcitonin: 3.64 ng/mL

## 2023-10-09 LAB — GLUCOSE, CAPILLARY
Glucose-Capillary: 100 mg/dL — ABNORMAL HIGH (ref 70–99)
Glucose-Capillary: 107 mg/dL — ABNORMAL HIGH (ref 70–99)
Glucose-Capillary: 94 mg/dL (ref 70–99)

## 2023-10-09 LAB — PHOSPHORUS
Phosphorus: 2.2 mg/dL — ABNORMAL LOW (ref 2.5–4.6)
Phosphorus: 2.6 mg/dL (ref 2.5–4.6)

## 2023-10-09 MED ORDER — LACTATED RINGERS IV BOLUS
1000.0000 mL | Freq: Once | INTRAVENOUS | Status: AC
  Administered 2023-10-09: 1000 mL via INTRAVENOUS

## 2023-10-09 MED ORDER — OXYCODONE HCL 5 MG PO TABS: 5.0000 mg | ORAL_TABLET | ORAL | Status: DC | PRN

## 2023-10-09 MED ORDER — SODIUM CHLORIDE 0.9 % IV SOLN: 250.0000 mL | INTRAVENOUS | Status: AC

## 2023-10-09 MED ORDER — MIDODRINE HCL 5 MG PO TABS
2.5000 mg | ORAL_TABLET | Freq: Three times a day (TID) | ORAL | Status: DC
  Filled 2023-10-09: qty 1

## 2023-10-09 MED ORDER — METHOCARBAMOL 500 MG PO TABS
1000.0000 mg | ORAL_TABLET | Freq: Three times a day (TID) | ORAL | Status: DC
  Administered 2023-10-09 – 2023-10-10 (×4): 1000 mg via ORAL
  Filled 2023-10-09 (×4): qty 2

## 2023-10-09 MED ORDER — MAGNESIUM SULFATE 4 GM/100ML IV SOLN
4.0000 g | Freq: Once | INTRAVENOUS | Status: AC
  Administered 2023-10-09: 4 g via INTRAVENOUS
  Filled 2023-10-09: qty 100

## 2023-10-09 MED ORDER — LACTATED RINGERS IV SOLN: INTRAVENOUS | Status: AC

## 2023-10-09 MED ORDER — SODIUM CHLORIDE 0.9 % IV BOLUS: 500.0000 mL | Freq: Once | INTRAVENOUS | Status: DC

## 2023-10-09 MED ORDER — HYDROCODONE-ACETAMINOPHEN 5-325 MG PO TABS
1.0000 | ORAL_TABLET | ORAL | Status: DC | PRN
  Administered 2023-10-09: 1 via ORAL
  Filled 2023-10-09: qty 1

## 2023-10-09 MED ORDER — ACETAMINOPHEN 500 MG PO TABS
1000.0000 mg | ORAL_TABLET | Freq: Four times a day (QID) | ORAL | Status: DC
  Administered 2023-10-09 – 2023-10-10 (×4): 1000 mg via ORAL
  Filled 2023-10-09 (×6): qty 2

## 2023-10-09 MED ORDER — NOREPINEPHRINE 4 MG/250ML-% IV SOLN
0.0000 ug/min | INTRAVENOUS | Status: DC
  Administered 2023-10-09: 2 ug/min via INTRAVENOUS
  Administered 2023-10-10: 7 ug/min via INTRAVENOUS
  Administered 2023-10-10: 10 ug/min via INTRAVENOUS
  Filled 2023-10-09 (×3): qty 250

## 2023-10-09 MED ORDER — MAGNESIUM OXIDE -MG SUPPLEMENT 400 (240 MG) MG PO TABS
400.0000 mg | ORAL_TABLET | Freq: Every day | ORAL | Status: DC
  Administered 2023-10-09: 400 mg via ORAL
  Filled 2023-10-09 (×3): qty 1

## 2023-10-09 MED ORDER — TRAZODONE HCL 50 MG PO TABS
50.0000 mg | ORAL_TABLET | Freq: Every day | ORAL | Status: DC
  Administered 2023-10-09 (×2): 50 mg via ORAL
  Filled 2023-10-09 (×2): qty 1

## 2023-10-09 MED ORDER — POTASSIUM CHLORIDE 10 MEQ/100ML IV SOLN
10.0000 meq | INTRAVENOUS | Status: DC
  Administered 2023-10-09: 10 meq via INTRAVENOUS
  Filled 2023-10-09: qty 100

## 2023-10-09 MED ORDER — ADULT MULTIVITAMIN W/MINERALS CH
1.0000 | ORAL_TABLET | Freq: Every day | ORAL | Status: DC
  Administered 2023-10-09: 1 via ORAL
  Filled 2023-10-09: qty 1

## 2023-10-09 MED ORDER — PANTOPRAZOLE SODIUM 40 MG PO TBEC
40.0000 mg | DELAYED_RELEASE_TABLET | Freq: Two times a day (BID) | ORAL | Status: DC
  Administered 2023-10-09 (×3): 40 mg via ORAL
  Filled 2023-10-09 (×3): qty 1

## 2023-10-09 MED ORDER — BUDESON-GLYCOPYRROL-FORMOTEROL 160-9-4.8 MCG/ACT IN AERO
2.0000 | INHALATION_SPRAY | Freq: Two times a day (BID) | RESPIRATORY_TRACT | Status: DC
  Administered 2023-10-09 – 2023-10-19 (×21): 2 via RESPIRATORY_TRACT
  Filled 2023-10-09: qty 5.9

## 2023-10-09 MED ORDER — ALBUMIN HUMAN 25 % IV SOLN
25.0000 g | Freq: Once | INTRAVENOUS | Status: AC
  Administered 2023-10-09: 25 g via INTRAVENOUS
  Filled 2023-10-09: qty 100

## 2023-10-09 MED ORDER — POTASSIUM PHOSPHATES 15 MMOLE/5ML IV SOLN
20.0000 mmol | Freq: Once | INTRAVENOUS | Status: AC
  Administered 2023-10-09: 20 mmol via INTRAVENOUS
  Filled 2023-10-09: qty 6.67

## 2023-10-09 MED ORDER — POTASSIUM CHLORIDE CRYS ER 20 MEQ PO TBCR
20.0000 meq | EXTENDED_RELEASE_TABLET | Freq: Every day | ORAL | Status: DC
  Administered 2023-10-09: 20 meq via ORAL
  Filled 2023-10-09: qty 1

## 2023-10-09 MED ORDER — CITALOPRAM HYDROBROMIDE 20 MG PO TABS
40.0000 mg | ORAL_TABLET | Freq: Every day | ORAL | Status: DC
  Administered 2023-10-09: 40 mg via ORAL
  Filled 2023-10-09 (×2): qty 2

## 2023-10-09 MED ORDER — AMLODIPINE BESYLATE 5 MG PO TABS: 5.0000 mg | ORAL_TABLET | Freq: Every day | ORAL | Status: DC

## 2023-10-09 MED ORDER — KETOROLAC TROMETHAMINE 15 MG/ML IJ SOLN
30.0000 mg | Freq: Four times a day (QID) | INTRAMUSCULAR | Status: DC
  Administered 2023-10-09 (×2): 30 mg via INTRAVENOUS
  Filled 2023-10-09 (×2): qty 2

## 2023-10-09 MED ORDER — LORAZEPAM 1 MG PO TABS: 1.0000 mg | ORAL_TABLET | ORAL | Status: DC | PRN

## 2023-10-09 MED ORDER — MIDODRINE HCL 5 MG PO TABS
5.0000 mg | ORAL_TABLET | Freq: Three times a day (TID) | ORAL | Status: DC
  Administered 2023-10-09 – 2023-10-10 (×3): 5 mg via ORAL
  Filled 2023-10-09 (×2): qty 1

## 2023-10-09 MED ORDER — HYDROMORPHONE HCL 1 MG/ML IJ SOLN
0.5000 mg | INTRAMUSCULAR | Status: DC | PRN
  Administered 2023-10-09 – 2023-10-10 (×3): 0.5 mg via INTRAVENOUS
  Filled 2023-10-09 (×3): qty 1

## 2023-10-09 MED ORDER — AZELASTINE HCL 0.1 % NA SOLN
1.0000 | Freq: Every day | NASAL | Status: DC | PRN
Start: 2023-10-09 — End: 2023-10-19

## 2023-10-09 MED ORDER — POTASSIUM CHLORIDE CRYS ER 20 MEQ PO TBCR: 40.0000 meq | EXTENDED_RELEASE_TABLET | Freq: Once | ORAL | Status: DC

## 2023-10-09 MED ORDER — IPRATROPIUM-ALBUTEROL 0.5-2.5 (3) MG/3ML IN SOLN
3.0000 mL | Freq: Two times a day (BID) | RESPIRATORY_TRACT | Status: DC
  Administered 2023-10-09 – 2023-10-13 (×11): 3 mL via RESPIRATORY_TRACT
  Filled 2023-10-09 (×11): qty 3

## 2023-10-09 MED ORDER — SODIUM CHLORIDE 0.9 % IV BOLUS
500.0000 mL | Freq: Once | INTRAVENOUS | Status: AC
  Administered 2023-10-09: 500 mL via INTRAVENOUS

## 2023-10-09 MED ORDER — THIAMINE HCL 100 MG/ML IJ SOLN: 100.0000 mg | Freq: Every day | INTRAMUSCULAR | Status: DC

## 2023-10-09 MED ORDER — PROCHLORPERAZINE EDISYLATE 10 MG/2ML IJ SOLN
10.0000 mg | Freq: Four times a day (QID) | INTRAMUSCULAR | Status: DC | PRN
  Administered 2023-10-10 – 2023-10-18 (×4): 10 mg via INTRAVENOUS
  Filled 2023-10-09 (×4): qty 2

## 2023-10-09 MED ORDER — ALPRAZOLAM 0.5 MG PO TABS: 1.0000 mg | ORAL_TABLET | Freq: Two times a day (BID) | ORAL | Status: DC | PRN

## 2023-10-09 MED ORDER — MIDODRINE HCL 5 MG PO TABS: 5.0000 mg | ORAL_TABLET | Freq: Three times a day (TID) | ORAL | Status: DC

## 2023-10-09 MED ORDER — POTASSIUM CHLORIDE 10 MEQ/100ML IV SOLN
10.0000 meq | INTRAVENOUS | Status: AC
  Administered 2023-10-09 – 2023-10-10 (×6): 10 meq via INTRAVENOUS
  Filled 2023-10-09 (×7): qty 100

## 2023-10-09 MED ORDER — MAGNESIUM SULFATE 4 GM/100ML IV SOLN
4.0000 g | Freq: Once | INTRAVENOUS | Status: AC
  Administered 2023-10-09: 4 g via INTRAVENOUS
  Filled 2023-10-09 (×2): qty 100

## 2023-10-09 MED ORDER — THIAMINE MONONITRATE 100 MG PO TABS
100.0000 mg | ORAL_TABLET | Freq: Every day | ORAL | Status: DC
  Administered 2023-10-09 – 2023-10-19 (×10): 100 mg via ORAL
  Filled 2023-10-09 (×10): qty 1

## 2023-10-09 MED ORDER — FOLIC ACID 1 MG PO TABS
1.0000 mg | ORAL_TABLET | Freq: Every day | ORAL | Status: DC
  Administered 2023-10-09: 1 mg via ORAL
  Filled 2023-10-09: qty 1

## 2023-10-09 MED ORDER — LORAZEPAM 2 MG/ML IJ SOLN: 1.0000 mg | INTRAMUSCULAR | Status: DC | PRN

## 2023-10-09 MED ORDER — ROPINIROLE HCL 1 MG PO TABS
1.0000 mg | ORAL_TABLET | Freq: Every day | ORAL | Status: DC
  Administered 2023-10-09 (×2): 1 mg via ORAL
  Filled 2023-10-09 (×3): qty 1

## 2023-10-09 NOTE — H&P (Incomplete)
 History and Physical    Patient: Vicki [name] ZOX:[id number] DOB: 04-03-1966 DOA: 10/08/2023 DOS: the patient was seen and examined on 10/08/2023 PCP: Jenelle Mis, FNP  Patient coming from: Home  Chief Complaint: No chief complaint on file.  HPI: Vicki [name] is a 58 y.o. female with medical history significant of hypertension, hyperlipidemia, COPD, chronic respiratory failure on 4 L of oxygen, history of C. difficile, and anxiety presents with severe abdominal.  She experiences severe pain in her pelvis and abdomen, primarily on the right side, radiating to her back. The pain is described as stabbing or like twisted intestines. This episode has lasted about a week, beginning with diarrhea and mucus discharge from the rectum, but no blood. The pain becomes so intense that it feels that she cannot breathe.  This is her fifth hospital visit for similar symptoms. She has a history of two hernias, one on the right side and another that is difficult to locate. The last episode resulted in bruising when the hernia protruded.  She is normally on four liters of oxygen at home and has a history of smoking, though she currently only takes a few puffs from a cigarette occasionally.  In the emergency department patient was noted to be febrile up to 102.5 F with tachycardia and tachypnea meeting SIRS criteria.  Labs significant for WBC 11.3, potassium 3.2, calcium  7.9, PMN 3.4, AST 61, high-sensitivity troponins negative x 2, and lactic acid 2.9->8.7.  Urinalysis without significant signs for infection.  Blood cultures were obtained.  CT scan of the abdomen and pelvis noted moderate severity sigmoid diverticulitis without associated perforation or abscess, mildly inflamed loops of adjacent distal ileum, hepatic steatosis, and isodense liver lesion.  Due to clinical concern PCCM and general surgery had been consulted.  Patient had been given 2.5 L of lactated Ringer 's, acetaminophen  650 mg  p.o., morphine , fentanyl  IV, vancomycin , metronidazole , and aztreonam.   Review of Systems: As mentioned in the history of present illness. All other systems reviewed and are negative. Past Medical History:  Diagnosis Date  . Allergy    seasonal allergies  . Anxiety    on meds  . Arthritis    hands  . Asthma    childhood-current  . CAP (community acquired pneumonia) 04/07/2019  . Carpal tunnel syndrome    had bilateral sx to tx  . Cataract    bilateral--no sx yet  . COPD (chronic obstructive pulmonary disease) (HCC)   . Depression    on meds  . Diverticulitis   . Emphysema of lung (HCC)    uses MDI  . Essential hypertension, benign 07/14/2019   on meds  . Hyperlipidemia    on meds  . Oxygen deficiency    on 5 L cont [place]  . Oxygen dependent    on 5 L cont [place]   Past Surgical History:  Procedure Laterality Date  . ABDOMINAL HYSTERECTOMY  2009  . APPENDECTOMY    . ARTHROSCOPIC REPAIR ACL Left 2011/2014   x 2  . BIOPSY  01/21/2020   Procedure: BIOPSY;  Surgeon: Sergio Dandy, MD;  Location: [facility];  Service: Endoscopy;;  EGD and COLON  . BUNIONECTOMY  2006   both  . CARPAL TUNNEL RELEASE Bilateral 10/28/2013   Procedure: BILATERAL CARPAL TUNNEL RELEASE;  Surgeon: Kemp Patter, MD;  Location: [facility];  Service: Orthopedics;  Laterality: Bilateral;  . CHOLECYSTECTOMY  1994  . CHONDROPLASTY Right 08/31/2020   Procedure: CHONDROPLASTY;  Surgeon:  Marlena Sima, MD;  Location: [facility];  Service: Orthopedics;  Laterality: Right;  . COLONOSCOPY WITH PROPOFOL  N/A 01/21/2020   Procedure: COLONOSCOPY WITH PROPOFOL ;  Surgeon: Sergio Dandy, MD;  Location: [facility];  Service: Endoscopy;  Laterality: N/A;  . DILATION AND CURETTAGE OF UTERUS    . ESOPHAGOGASTRODUODENOSCOPY (EGD) WITH PROPOFOL  N/A 01/21/2020   Procedure: ESOPHAGOGASTRODUODENOSCOPY (EGD) WITH PROPOFOL ;  Surgeon: Sergio Dandy, MD;  Location: [facility];   Service: Endoscopy;  Laterality: N/A;  . KNEE ARTHROSCOPY WITH ANTERIOR CRUCIATE LIGAMENT (ACL) REPAIR Right 08/31/2020   Procedure: RIGHT KNEE ARTHROSCOPY WITH ANTERIOR CRUCIATE LIGAMENT (ACL) REPAIR MEDIAL AND LATERAL  MENISECTOMY;  Surgeon: Marlena Sima, MD;  [facility];  Service: Orthopedics;  Laterality: Right;  FEMORAL NERVE BLOCK  . POLYPECTOMY  01/21/2020   Procedure: POLYPECTOMY;  Surgeon: Sergio Dandy, MD;  Location: [facility];  Service: Endoscopy;;  . TUBAL LIGATION  2004   Social History:  reports that she has been smoking cigarettes. She has a 34 pack-year smoking history. She has never used smokeless tobacco. She reports current alcohol use. She reports that she does not use drugs.  Allergies  Allergen Reactions  . Jasmine Oil Shortness Of Breath and Swelling  . Levaquin  [Levofloxacin ] Shortness Of Breath, Itching, Swelling and Rash  . Penicillins Anaphylaxis and Hives    Tolerated ceftriaxone   . Aleve [Naproxen Sodium] Itching  . Ceftin [Cefuroxime Axetil] Other (See Comments)    unsure  . Chlorhexidine      Unknown reaction  . Depo-Provera [Medroxyprogesterone Acetate] Other (See Comments)    Severe acne  . Doxycycline  Other (See Comments)    Raises blood pressure   . Tramadol Other (See Comments)    Kept her up all night    Family History  Problem Relation Age of Onset  . Brain cancer Mother 43  . Lung cancer Mother 81  . Breast cancer Sister 37  . Ovarian cancer Sister 44  . Colon cancer Neg Hx   . Colon polyps Neg Hx   . Esophageal cancer Neg Hx   . Rectal cancer Neg Hx   . Stomach cancer Neg Hx     Prior to Admission medications   Medication Sig Start Date End Date Taking? Authorizing Provider  acetaminophen  (TYLENOL ) 500 MG tablet Take 500-1,000 mg by mouth every 6 (six) hours as needed for moderate pain (pain score 4-6).   Yes [provider]  albuterol  (VENTOLIN  HFA) 108 (90 Base) MCG/ACT inhaler INHALE 2  PUFFS INTO THE LUNGS TWICE A DAY Patient taking differently: Inhale 2 puffs into the lungs 2 (two) times daily as needed for shortness of breath. 07/15/23  Yes Howard, Amber S, FNP  ALPRAZolam  (XANAX ) 1 MG tablet Take 1 tablet (1 mg total) by mouth 2 (two) times daily as needed for anxiety. 08/28/23  Yes Howard, Amber S, FNP  amLODipine  (NORVASC ) 5 MG tablet Take 1 tablet (5 mg total) by mouth daily. 03/22/23  Yes Howard, Amber S, FNP  ASPIRIN  LOW DOSE 81 MG EC tablet TAKE 1 TABLET BY MOUTH 2 TIMES DAILY. Patient taking differently: Take 81 mg by mouth in the morning and at bedtime. 03/21/21  Yes Ulyess Gammons, NP  azelastine  (ASTELIN ) 0.1 % nasal spray Place 1 spray into both nostrils 2 (two) times daily. Use in each nostril as directed Patient taking differently: Place 1 spray into both nostrils daily as needed for allergies. 07/07/21  Yes Ulyess Gammons, NP  citalopram  (CELEXA ) 40  MG tablet TAKE 1 TABLET BY MOUTH EVERY DAY 07/31/23  Yes Jenelle Mis, FNP  Fluticasone -Umeclidin-Vilant (TRELEGY ELLIPTA ) 200-62.5-25 MCG/ACT AEPB TAKE 1 PUFF BY MOUTH EVERY DAY 02/22/23  Yes Olalere, Adewale A, MD  hydrOXYzine  (ATARAX ) 25 MG tablet TAKE 0.5-1 TABLETS (12.5-25 MG TOTAL) BY MOUTH 3 (THREE) TIMES DAILY AS NEEDED FOR ANXIETY. 09/18/21  Yes Ulyess Gammons, NP  ipratropium-albuterol  (DUONEB) 0.5-2.5 (3) MG/3ML SOLN Use 1 vial by nebulization in the morning and at bedtime. 10/10/21  Yes Ulyess Gammons, NP  Magnesium  Citrate 100 MG TABS Take 1 tablet by mouth at bedtime. Patient taking differently: Take 100 mg by mouth at bedtime. 10/10/21  Yes Ulyess Gammons, NP  meclizine  (ANTIVERT ) 25 MG tablet Take 1 tablet (25 mg total) by mouth 3 (three) times daily as needed for dizziness. 12/27/20  Yes Ulyess Gammons, NP  omeprazole  (PRILOSEC) 40 MG capsule Take 1 capsule (40 mg total) by mouth 2 (two) times daily. Take 30 mins. Prior to breakfast and supper 03/04/23  Yes Graciella Lavender, [place]  ondansetron  (ZOFRAN )  4 MG tablet Take one tablet by mouth every 4-6 hours as needed 03/04/23  Yes Lemmon, Kathy Parker, PA  potassium chloride  SA (KLOR-CON  M) 20 MEQ tablet Take 1 tablet (20 mEq total) by mouth daily. 10/10/21  Yes Ulyess Gammons, NP  rizatriptan  (MAXALT ) 10 MG tablet TAKE 1 TABLET BY MOUTH AS NEEDED FOR MIGRAINE. MAY REPEAT IN 2 HOURS IF NEEDED STRENGTH: 10 MG 09/26/23  Yes Gwen Lek, [facility] S, FNP  rOPINIRole  (REQUIP ) 2 MG tablet TAKE 1/2 TO 1 TABLETS BY MOUTH AT BEDTIME. 02/25/23  Yes Howard, [employer], FNP  traZODone  (DESYREL ) 50 MG tablet Take 1 tablet (50 mg total) by mouth at bedtime. 05/06/23  Yes Howard, Amber S, FNP  EPINEPHrine  (EPIPEN  2-PAK) 0.3 mg/0.3 mL IJ SOAJ injection Inject 0.3 mg into the muscle as needed for anaphylaxis. Patient not taking: Reported on 10/08/2023 12/27/20   Ulyess Gammons, NP  metoCLOPramide  (REGLAN ) 10 MG tablet Take 1 tablet (10 mg total) by mouth 3 (three) times daily with meals. Patient not taking: Reported on 10/08/2023 12/27/22 01/26/23  Haydee Lipa, MD  rosuvastatin  (CRESTOR ) 10 MG tablet Take 1 tablet (10 mg total) by mouth daily. Patient not taking: Reported on 10/08/2023 05/01/23   Jenelle Mis, FNP    Physical Exam: Vitals:   10/08/23 1830 10/08/23 1930 10/08/23 2000 10/08/23 2100  BP: 129/89 115/78 104/72 110/79  Pulse: 96 (!) 110 (!) 110 (!) 113  Resp: 16 (!) 22 (!) 21 18  Temp:  (!) 100.8 F (38.2 C)    TempSrc:      SpO2: 100% 100% 99% 97%   *** Data Reviewed: {Tip this will not be part of the note when signed- Document your independent interpretation of telemetry tracing, EKG, lab, Radiology test or any other diagnostic tests. Add any new diagnostic test ordered today. (Optional):26781} {Results:26384}  Assessment and Plan: Severe sepsis secondary to sigmoid diverticulitis Patient presents with complaints of severe abdominal pain and diarrhea over the last week.  In the ED found to be febrile up to 102.5 F with tachycardia and  tachypnea meeting SIRS criteria.  CT scan pelvis revealed marked severity sigmoid diverticulitis without associated perforation or abscess.  Lactic acid was noted to be elevated from 2.6->8.7.  Blood cultures have been obtained.  Patient was given 2.5 L of IV fluids, vancomycin , metronidazole , and aztreonam.   - Admit to progressive bed - Follow-up blood cultures - Clear liquid  diet - Check C. difficile - Continue metronidazole .  Change aztreonam to Rocephin  - Continue lactated Ringer 's at 150 mL/h - Trend lactic acid levels - Appreciate general surgery consultative services we will follow-up for any further recommendations  Hypokalemia Acute.  Potassium noted to be 3.2. - Give 40 mEq of potassium chloride  p.o.  COPD Chronic respiratory failure with hypoxia - Continue nasal cannula oxygen 4 L  Essential hypertension Blood pressures currently maintained. - Continue amlodipine   Anxiety - Continue Celexa  and Xanax  as needed   GERD -   Advance Care Planning:   Code Status: Full Code ***  Consults: ***  Family Communication: ***  Severity of Illness: {Observation/Inpatient:21159}  Author: Lena Qualia, MD 10/08/2023 10:01 PM  For on call review [email or web address].

## 2023-10-09 NOTE — Hospital Course (Addendum)
 The patient is a 58 year old obese Caucasian female with past medical history significant for Mannam to hypertension, hyperlipidemia, COPD with chronic respiratory failure on 4 L of supplemental oxygen, history of C. difficile, history of recurrent diverticulitis, anxiety depression presents with severe abdominal discomfort and pain.  Further workup reveals she has an acute sigmoid diverticulitis without perforation or abscess.  Patient was noted to be septic on admission and remains in the progressive care unit and is getting IV fluid hydration.  General surgery has been consulted for further evaluation and recommendations and she is now on a clear liquid diet.  Antiemetics and analgesics are being adjusted given her continued nausea and abdominal discomfort and pain.  Assessment and Plan:  Severe Sepsis secondary to Sigmoid Diverticulitis: Patient presents with complaints of severe abdominal pain and diarrhea over the last week.  In the ED found to be febrile up to 102.5 F with tachycardia and tachypnea meeting SIRS criteria.  CT Abd/Pelvis revealed marked severity sigmoid diverticulitis without associated perforation or abscess and also showed mildly inflamed loops of the adjacent distal ileum.. Lactic Acid Level Trend went from  2.6 -> 8.7 -> 2.0 -> 1.6 .  Blood cultures have been obtained and pending.  Patient was given 2.5 L of IV fluids and initiated on IV Vancomycin , metronidazole , and Aztreonam in the ED.  Patient has had prior history of multiple Diverticulitis Episodes. -Admitted to Progressive Bed and will need to Follow-up blood cultures and Monitor intake and output. -Diet advanced from NPO to Clear liquid diet -Check C. Difficile and pending  -C/w IV Metronidazol and Change Aztreonam to IV Ceftriaxone  -Continue Lactated Ringer 's at 150 mL/hr; continue with supportive care antiemetics with Ondansetron  4 mg p.o./IV every 6 as needed for nausea and 10 mg of prochlorperazine  10 mg every 6 as  needed for refractory nausea or vomiting -Pain control with IV morphine  4 mg every 3 as needed severe pain, methocarbamol 1000 mg p.o. every 8, IV ketorolac  30 mg every 6 scheduled, hydrocodone -acetaminophen  1 tab p.o. every 4 as needed for moderate pain and 1000 mg of Acetaminophen  every 6 hours scheduled -WBC went from 11.3 -> 9.8 -PCCM Consulted and feel she is not a candidate for ICU admission at this time and pending continue IV fluid hydration and antibiotics. -General Surgery consulted for further evaluation recommendations and feel that she is okay with critical diet.  They are recommended to monitor and trend with serial abdominal exams and feel that hopefully she can improve with antibiotics this hospitalization without surgery.  Recommending colonoscopy in 6 weeks post resolution of this episode and discussion with the surgery team in outpatient setting for elective sigmoidectomy  Liver Lesion with Hepatic Steatosis: Patient found to have an isodense liver lesion that measured 1.7 x 2.1 x 3.0 cm in the right lobe of the liver with diffuse fatty infiltration of the liver parenchyma noted.  Recommendation is for outpatient nonemergent MRI to further exclude presence of underlying neoplastic process    COPD / Chronic Respiratory Failure with Hypoxia: C/w 4 L of Supplemental O2 via [place].  Continue supplemental oxygen via nasal cannula continuing with Breztri 2 puffs IH twice daily, and DuoNeb 3 mL twice daily   Essential HTN:  Hold Amlodipine  5 mg po Daily. CTM BP per Protocol. Last BP reading was 94/64. Getting IVF as above w/ Boluses.  RLS: C/w Ropinirole  1 mg po at bedtime  Migraines: C/w Supmatriptan 100 mg po q2hprn    Alcohol Abuse: Patient reports drinking a  little less than a pint of vodka every few days or so, but her husband reports that she drinks a pint of vodka per day on average. C/w CIWA protocols with po/IV Lorazepam  1-4 mg q1h as needed. C/w Thiamine  100 mg po/IV daily, Folic  Acid 1 mg po Daily, MVI + Minerals 1 tab po Daily    Anxiety and Depression: C/w Citalopram  40 mg po Daily and Alrpazolam 1 mg po BID as needed   Insomnia: C/w Trazodone  50 mg po qHS   Tobacco Abuse: Patient reports only smoking a few puffs of a cigarettes here in there, but husband reported that she still smokes about a pack per day on average. C/w Tobacco Cessation Counseling    Hyperlipidemia: Previous lipid panel noted total cholesterol 293, HDL 67, LDL 188, and triglycerides 202. Will consider resuming Rosuvastatin     Hypokalemia: Patient's K+ Level went from 3.2 -> 2.9. Replete w/ po Kcl 40 mEQ + 20 mEQ x1. Also replete w/ IV KPhos 20 mmol. CTM and Replete as Necessary. Repeat CMP in the AM  Hypomagnesemia: Patient's Mag Level is 0.7. Replete w/ IV Mag Sulfate 4 grams. CTM and Replete as Necessary. Repeat Mag in the AM  Hypophosphatemia: Phos Level is now 2.2 Replete with IV KPhos 20 mmol.CTM and Replete as Necessary. Repeat Phos Level in the AM  Elevated AST: Likely Reactive and Chronic and appears similar to prior values. AST is now gone from 61 -> 45. AST to ALT ratio c/w Alcohol Abuse. CTM and Trend and repeat CMP in the AM and if necessary will obtain RUQ U/S and Acute Hepatitis Panel  Normocytic Anemia: Hgb/Hct is now 10.4/32.1. Check Anemia Panel in the AM. CTM for S/S of Bleeding; No overt bleeding noted. Repeat CBC in the AM  GERD/GI Prophylaxis:  Continue pharmacy substitution of PPI w/ Pantoprazole  40 mg BID  Hypoalbuminemia: Patient's Albumin Level went from 3.4 -> 2.4. CTM and Trend and repeat CMP in the AM  Overweight: Complicates overall prognosis and care. Estimated body mass index is 29.41 kg/m as calculated from the following:   Height as of 04/23/23: 5\' 3"  (1.6 m).   Weight as of 04/23/23: 75.3 kg. Weight Loss and Dietary Counseling given

## 2023-10-09 NOTE — Progress Notes (Signed)
 Pt's  BP soft throughout shift. Pt states she feels "weak and tired", and has been alert and verbally responsive. Dr. Gillermo Lack notified several times during shift about pt's low BP. See new orders. Pt's BP remains low and does not increase despite interventions. Dr. Gillermo Lack notified and new orders to transfer pt to ICU. Report given to [facility] RN Ammon Bales. Pt transported off unit to [id number]-08 with RN, NT, and all personal belongings. Pt's husband at bedside and updated.

## 2023-10-09 NOTE — Progress Notes (Signed)
 Patient just seen earlier this morning by Dr. Aniceto Barley.  Asked to resee patient due to abdominal pain.  She states her pain is slightly better, but she just started treatment.  She is diffusely tender, but vitals stable at this time.  She is overall relatively comfortable at this time.  Her nausea seems to be improving.  May trial CLD as able.  We will continue to monitor, but continue conservative treatment for now with bowel rest/IV abx.    Leone Ralphs 10:22 AM 10/09/2023

## 2023-10-09 NOTE — Consult Note (Signed)
 NAME:  Vicki [name], MRN:  [id number], DOB:  12/25/1965, LOS: 1 ADMISSION DATE:  10/08/2023, CONSULTATION DATE:  10/09/2023 REFERRING MD:  Gillermo Lack, [facility] CHIEF COMPLAINT:  septic shock   History of Present Illness:  58 y/o female with PMH for COPD/Emphysema on 4-5 liters home O2 but still smoking (follows [facility]), Depression, esophagitis, recurrent Diverticulitis and recurrent C diff (follows with GI) who presented with abdominal pain x 1 week and also several days of n/v and 1 week of diarrhea. She says she initially thought hse had a UTI and trying to treat herself at home and with OTC meds but it did not get better and in the last 3 days has worsened. Therefore she came to the hospital. She denies blood in her stool. Her LA was 2.9 in ED and then repat was 8.7. She c/o generalized abdominal pain.   Pertinent  Medical History  Anxiety, Arthritis, Asthma, CAP (community acquired pneumonia) (04/07/2019), Carpal tunnel syndrome, Cataract, COPD (chronic obstructive pulmonary disease) (HCC), Depression, Diverticulitis, Emphysema of lung (HCC), Essential hypertension, benign (07/14/2019), Hyperlipidemia, Oxygen deficiency, and Oxygen dependent.   Significant Hospital Events: Including procedures, antibiotic start and stop dates in addition to other pertinent events   5/27: initially evaluated by PCCM, deferred ICU admission as BP stable, recommending continued IVF, antibiotics 5/28: re-consult for hypotension after IVF resuscitation, albumin   Interim History / Subjective:  Having ongoing moderate to severe abdominal pain and pressure. She feels tired. Still having diarrhea. No nausea or vomiting. Tolerating clear liquids.   Objective    Blood pressure (!) 76/52, pulse 84, temperature 98.4 F (36.9 C), temperature source Oral, resp. rate 20, SpO2 95%.        Intake/Output Summary (Last 24 hours) at 10/09/2023 1804 Last data filed at 10/09/2023 0800 Gross per 24 hour  Intake --   Output 200 ml  Net -200 ml   There were no vitals filed for this visit.  Examination: General: Middle-age female, sitting in bed, uncomfortable appearing HENT: NCAT, anicteric sclera, moist mucous membranes Lungs: Clear to auscultation bilaterally, 4 L nasal cannula Cardiovascular: S1, S2, no murmur, sinus rhythm Abdomen: Rounded, tenderness to mild palpation in the lower quadrants, no rigidity, decreased bowel sounds Extremities: No pitting edema Neuro: Awake, alert and oriented GU: No Foley  Resolved Hospital Problem list    Assessment & Plan:  Septic shock secondary to sigmoid diverticulitis Lactic acidosis  History of recurrent diverticulitis and recurrent C diff infections  Presented with 1 week of abdominal pain, nausea and vomiting and diarrhea.  CT with marked severity sigmoid diverticulitis without abscess or perforation.  She has history of recurrent sigmoid diverticulitis and followed by [facility] gastroenterology.  Lactic 2.9> 8.7> 2> 1.6 - Continue Rocephin , Flagyl  IV - Peripheral Levophed for MAP goal >65 - Zofran  and Compazine  for nausea -Pain control with Dilaudid  - Can continue oral midodrine 5 mg every 8 - PPI BID - Surgery following, appreciate their help in management - Consider GI consult in the morning  Hypomagnesemia  Hypokalemia   -Repeat 4 g mag now -Potassium for 6 runs as well as oral -Repeat labs at 9pm  Alcohol use disorder Alcohol withdrawal Drinks slightly less than a pint of vodka every few days -CIWA protocol -Thiamine , folic acid , multivitamin -Seizure precautions -Counsel on cessation  COPD on 4-5L home O2  Asthma  - O2 nasal cannula for sat goal 90% -Scheduled DuoNeb -Continue Breztri  - albuterol  PRN  Hypertension Hyperlipidemia - hold antihypertensives in setting  of shock  - continue home statin   Depression  Insomnia  - continue celexa  40mg  daily  - trazadone nightly   Best Practice (right click and "Reselect all  SmartList Selections" daily)   Diet/type: clear liquids DVT prophylaxis: LMWH GI prophylaxis: PPI Lines: N/A Foley:  N/A Code Status:  full code Last date of multidisciplinary goals of care discussion [update husband and pt on plan of care at transfer]  Labs   CBC: Recent Labs  Lab 10/08/23 1633 10/09/23 0442  WBC 11.3* 9.8  NEUTROABS 9.4*  --   HGB 13.0 10.4*  HCT 39.6 32.1*  MCV 95.2 96.4  PLT 213 161    Basic Metabolic Panel: Recent Labs  Lab 10/08/23 1633 10/09/23 0442  NA 138 137  K 3.2* 2.9*  CL 95* 99  CO2 31 32  GLUCOSE 133* 108*  BUN <5* <5*  CREATININE 0.70 0.74  CALCIUM  7.9* 7.8*  MG  --  0.7*  PHOS  --  2.2*   GFR: CrCl cannot be calculated (Unknown ideal weight.). Recent Labs  Lab 10/08/23 1633 10/08/23 1706 10/08/23 1900 10/09/23 0030 10/09/23 0438 10/09/23 0442  WBC 11.3*  --   --   --   --  9.8  LATICACIDVEN  --  2.9* 8.7* 2.0* 1.6  --     Liver Function Tests: Recent Labs  Lab 10/08/23 1633 10/09/23 0442  AST 61* 45*  ALT 16 13  ALKPHOS 121 78  BILITOT 1.2 1.0  PROT 6.9 5.2*  ALBUMIN 3.4* 2.4*   No results for input(s): "LIPASE", "AMYLASE" in the last 168 hours. No results for input(s): "AMMONIA" in the last 168 hours.  ABG    Component Value Date/Time   HCO3 32.4 (H) 04/05/2022 1353   TCO2 34 (H) 04/05/2022 1353   O2SAT 90 04/05/2022 1353     Coagulation Profile: Recent Labs  Lab 10/08/23 1633  INR 1.0    Cardiac Enzymes: No results for input(s): "CKTOTAL", "CKMB", "CKMBINDEX", "TROPONINI" in the last 168 hours.  HbA1C: Hgb A1c MFr Bld  Date/Time Value Ref Range Status  04/25/2023 11:36 AM 5.3 <5.7 % of total Hgb Final    Comment:    For the purpose of screening for the presence of diabetes: . <5.7%       Consistent with the absence of diabetes 5.7-6.4%    Consistent with increased risk for diabetes             (prediabetes) > or =6.5%  Consistent with diabetes . This assay result is consistent with  a decreased risk of diabetes. . Currently, no consensus exists regarding use of hemoglobin A1c for diagnosis of diabetes in children. . According to American Diabetes Association (ADA) guidelines, hemoglobin A1c <7.0% represents optimal control in non-pregnant diabetic patients. Different metrics may apply to specific patient populations.  Standards of Medical Care in Diabetes(ADA). Aaron Aas   07/10/2021 01:20 PM 5.4 4.6 - 6.5 % Final    Comment:    Glycemic Control Guidelines for People with Diabetes:Non Diabetic:  <6%Goal of Therapy: <7%Additional Action Suggested:  >8%     CBG: No results for input(s): "GLUCAP" in the last 168 hours.  Review of Systems:   As above  Past Medical History:  She,  has a past medical history of Allergy, Anxiety, Arthritis, Asthma, CAP (community acquired pneumonia) (04/07/2019), Carpal tunnel syndrome, Cataract, COPD (chronic obstructive pulmonary disease) (HCC), Depression, Diverticulitis, Emphysema of lung (HCC), Essential hypertension, benign (07/14/2019), Hyperlipidemia, Oxygen deficiency, and Oxygen dependent.  Surgical History:   Past Surgical History:  Procedure Laterality Date   ABDOMINAL HYSTERECTOMY  2009   APPENDECTOMY     ARTHROSCOPIC REPAIR ACL Left 2011/2014   x 2   BIOPSY  01/21/2020   Procedure: BIOPSY;  Surgeon: Sergio Dandy, MD;  Location: [facility];  Service: Endoscopy;;  EGD and COLON   BUNIONECTOMY  2006   both   CARPAL TUNNEL RELEASE Bilateral 10/28/2013   Procedure: BILATERAL CARPAL TUNNEL RELEASE;  Surgeon: Kemp Patter, MD;  Location: [facility];  Service: Orthopedics;  Laterality: Bilateral;   CHOLECYSTECTOMY  1994   CHONDROPLASTY Right 08/31/2020   Procedure: CHONDROPLASTY;  Surgeon: Marlena Sima, MD;  Location: [facility];  Service: Orthopedics;  Laterality: Right;   COLONOSCOPY WITH PROPOFOL  N/A 01/21/2020   Procedure: COLONOSCOPY WITH PROPOFOL ;  Surgeon: Sergio Dandy, MD;   Location: [facility];  Service: Endoscopy;  Laterality: N/A;   DILATION AND CURETTAGE OF UTERUS     ESOPHAGOGASTRODUODENOSCOPY (EGD) WITH PROPOFOL  N/A 01/21/2020   Procedure: ESOPHAGOGASTRODUODENOSCOPY (EGD) WITH PROPOFOL ;  Surgeon: Sergio Dandy, MD;  Location: [facility];  Service: Endoscopy;  Laterality: N/A;   KNEE ARTHROSCOPY WITH ANTERIOR CRUCIATE LIGAMENT (ACL) REPAIR Right 08/31/2020   Procedure: RIGHT KNEE ARTHROSCOPY WITH ANTERIOR CRUCIATE LIGAMENT (ACL) REPAIR MEDIAL AND LATERAL  MENISECTOMY;  Surgeon: Marlena Sima, MD;  Location: [facility];  Service: Orthopedics;  Laterality: Right;  FEMORAL NERVE BLOCK   POLYPECTOMY  01/21/2020   Procedure: POLYPECTOMY;  Surgeon: Sergio Dandy, MD;  Location: [facility];  Service: Endoscopy;;   TUBAL LIGATION  2004     Social History:   reports that she has been smoking cigarettes. She has a 34 pack-year smoking history. She has never used smokeless tobacco. She reports current alcohol use. She reports that she does not use drugs.   Family History:  Her family history includes Brain cancer (age of onset: 69) in her mother; Breast cancer (age of onset: 52) in her sister; Lung cancer (age of onset: 64) in her mother; Ovarian cancer (age of onset: 30) in her sister. There is no history of Colon cancer, Colon polyps, Esophageal cancer, Rectal cancer, or Stomach cancer.   Allergies Allergies  Allergen Reactions   Jasmine Oil Shortness Of Breath and Swelling   Levaquin  [Levofloxacin ] Shortness Of Breath, Itching, Swelling and Rash   Penicillins Anaphylaxis and Hives    Tolerated ceftriaxone    Aleve [Naproxen Sodium] Itching   Ceftin [Cefuroxime Axetil] Other (See Comments)    unsure   Chlorhexidine      Unknown reaction   Depo-Provera [Medroxyprogesterone Acetate] Other (See Comments)    Severe acne   Doxycycline  Other (See Comments)    Raises blood pressure    Tramadol Other (See Comments)    Kept her up all  night     Home Medications  Prior to Admission medications   Medication Sig Start Date End Date Taking? Authorizing Provider  acetaminophen  (TYLENOL ) 500 MG tablet Take 500-1,000 mg by mouth every 6 (six) hours as needed for moderate pain (pain score 4-6).   Yes [provider]  albuterol  (VENTOLIN  HFA) 108 (90 Base) MCG/ACT inhaler INHALE 2 PUFFS INTO THE LUNGS TWICE A DAY Patient taking differently: Inhale 2 puffs into the lungs 2 (two) times daily as needed for shortness of breath. 07/15/23  Yes Howard, Amber S, FNP  ALPRAZolam  (XANAX ) 1 MG tablet Take 1 tablet (1 mg total) by mouth 2 (two) times daily as  needed for anxiety. 08/28/23  Yes Howard, Amber S, FNP  amLODipine  (NORVASC ) 5 MG tablet Take 1 tablet (5 mg total) by mouth daily. 03/22/23  Yes Howard, Amber S, FNP  ASPIRIN  LOW DOSE 81 MG EC tablet TAKE 1 TABLET BY MOUTH 2 TIMES DAILY. Patient taking differently: Take 81 mg by mouth in the morning and at bedtime. 03/21/21  Yes Ulyess Gammons, NP  azelastine  (ASTELIN ) 0.1 % nasal spray Place 1 spray into both nostrils 2 (two) times daily. Use in each nostril as directed Patient taking differently: Place 1 spray into both nostrils daily as needed for allergies. 07/07/21  Yes Ulyess Gammons, NP  citalopram  (CELEXA ) 40 MG tablet TAKE 1 TABLET BY MOUTH EVERY DAY 07/31/23  Yes Jenelle Mis, FNP  Fluticasone -Umeclidin-Vilant (TRELEGY ELLIPTA ) 200-62.5-25 MCG/ACT AEPB TAKE 1 PUFF BY MOUTH EVERY DAY 02/22/23  Yes Olalere, Adewale A, MD  ipratropium-albuterol  (DUONEB) 0.5-2.5 (3) MG/3ML SOLN Use 1 vial by nebulization in the morning and at bedtime. 10/10/21  Yes Ulyess Gammons, NP  Magnesium  Citrate 100 MG TABS Take 1 tablet by mouth at bedtime. Patient taking differently: Take 100 mg by mouth at bedtime. 10/10/21  Yes Ulyess Gammons, NP  meclizine  (ANTIVERT ) 25 MG tablet Take 1 tablet (25 mg total) by mouth 3 (three) times daily as needed for dizziness. 12/27/20  Yes Ulyess Gammons, NP   omeprazole  (PRILOSEC) 40 MG capsule Take 1 capsule (40 mg total) by mouth 2 (two) times daily. Take 30 mins. Prior to breakfast and supper 03/04/23  Yes Graciella Lavender, [place]  ondansetron  (ZOFRAN ) 4 MG tablet Take one tablet by mouth every 4-6 hours as needed 03/04/23  Yes Lemmon, Kathy Parker, PA  potassium chloride  SA (KLOR-CON  M) 20 MEQ tablet Take 1 tablet (20 mEq total) by mouth daily. 10/10/21  Yes Ulyess Gammons, NP  rizatriptan  (MAXALT ) 10 MG tablet TAKE 1 TABLET BY MOUTH AS NEEDED FOR MIGRAINE. MAY REPEAT IN 2 HOURS IF NEEDED STRENGTH: 10 MG 09/26/23  Yes Yolanda Hence S, FNP  rOPINIRole  (REQUIP ) 2 MG tablet TAKE 1/2 TO 1 TABLETS BY MOUTH AT BEDTIME. 02/25/23  Yes Howard, [employer], FNP  traZODone  (DESYREL ) 50 MG tablet Take 1 tablet (50 mg total) by mouth at bedtime. 05/06/23  Yes Howard, Amber S, FNP  EPINEPHrine  (EPIPEN  2-PAK) 0.3 mg/0.3 mL IJ SOAJ injection Inject 0.3 mg into the muscle as needed for anaphylaxis. Patient not taking: Reported on 10/08/2023 12/27/20   Ulyess Gammons, NP  metoCLOPramide  (REGLAN ) 10 MG tablet Take 1 tablet (10 mg total) by mouth 3 (three) times daily with meals. Patient not taking: Reported on 10/08/2023 12/27/22 01/26/23  Haydee Lipa, MD  rosuvastatin  (CRESTOR ) 10 MG tablet Take 1 tablet (10 mg total) by mouth daily. Patient not taking: Reported on 10/08/2023 05/01/23   Jenelle Mis, FNP     Critical care time: [address]   Ar[facility] 10/09/23 6:04 PM  Please see [email or web address] for pager details.  From 7A-7P if no response, please call [phone] After hours, please call ELink [phone]

## 2023-10-09 NOTE — Progress Notes (Signed)
 PROGRESS NOTE    Vicki [name]  ZOX:[id number] DOB: [birth year] DOA: 10/08/2023 PCP: Jenelle Mis, FNP   Brief Narrative:  The patient is a 58 year old obese Caucasian female with past medical history significant for Mann[place] to hypertension, hyperlipidemia, COPD with chronic respiratory failure on 4 L of supplemental oxygen, history of C. difficile, history of recurrent diverticulitis, anxiety depression presents with severe abdominal discomfort and pain.  Further workup reveals she has an acute sigmoid diverticulitis without perforation or abscess.  Patient was noted to be septic on admission and remains in the progressive care unit and is getting IV fluid hydration.  General surgery has been consulted for further evaluation and recommendations and she is now on a clear liquid diet.  Antiemetics and analgesics are being adjusted given her continued nausea and abdominal discomfort and pain.  Assessment and Plan:  Severe Sepsis secondary to Sigmoid Diverticulitis: Patient presents with complaints of severe abdominal pain and diarrhea over the last week.  In the ED found to be febrile up to 102.5 F with tachycardia and tachypnea meeting SIRS criteria.  CT Abd/Pelvis revealed marked severity sigmoid diverticulitis without associated perforation or abscess and also showed mildly inflamed loops of the adjacent distal ileum.. Lactic Acid Level Trend went from  2.6 -> 8.7 -> 2.0 -> 1.6 .  Blood cultures have been obtained and pending.  Patient was given 2.5 L of IV fluids and initiated on IV Vancomycin , metronidazole , and Aztreonam  in the ED.  Patient has had prior history of multiple Diverticulitis Episodes. -Admitted to Progressive Bed and will need to Follow-up blood cultures and Monitor intake and output. -Diet advanced from NPO to Clear liquid diet -Check C. Difficile and pending  -C/w IV Metronidazol and Change Aztreonam  to IV Ceftriaxone  -Continue Lactated Ringer 's at 150 mL/hr; continue  with supportive care antiemetics with Ondansetron  4 mg p.o./IV every 6 as needed for nausea and 10 mg of prochlorperazine  10 mg every 6 as needed for refractory nausea or vomiting -Pain control with IV morphine  4 mg every 3 as needed severe pain, methocarbamol  1000 mg p.o. every 8, IV ketorolac  30 mg every 6 scheduled, hydrocodone -acetaminophen  1 tab p.o. every 4 as needed for moderate pain and 1000 mg of Acetaminophen  every 6 hours scheduled -WBC went from 11.3 -> 9.8 -PCCM Consulted and feel she is not a candidate for ICU admission at this time and pending continue IV fluid hydration and antibiotics. -General Surgery consulted for further evaluation recommendations and feel that she is okay with critical diet.  They are recommended to monitor and trend with serial abdominal exams and feel that hopefully she can improve with antibiotics this hospitalization without surgery.  Recommending colonoscopy in 6 weeks post resolution of this episode and discussion with the surgery team in outpatient setting for elective sigmoidectomy  Liver Lesion with Hepatic Steatosis: Patient found to have an isodense liver lesion that measured 1.7 x 2.1 x 3.0 cm in the right lobe of the liver with diffuse fatty infiltration of the liver parenchyma noted.  Recommendation is for outpatient nonemergent MRI to further exclude presence of underlying neoplastic process    COPD / Chronic Respiratory Failure with Hypoxia: C/w 4 L of Supplemental O2 via [place].  Continue supplemental oxygen via nasal cannula continuing with Breztri  2 puffs IH twice daily, and DuoNeb 3 mL twice daily   Essential HTN:  Hold Amlodipine  5 mg po Daily. CTM BP per Protocol. Last BP reading was 94/64. Getting IVF as above w/ Boluses.  RLS:  C/w Ropinirole  1 mg po at bedtime  Migraines: C/w Supmatriptan 100 mg po q2hprn    Alcohol Abuse: Patient reports drinking a little less than a pint of vodka every few days or so, but her husband reports that she drinks  a pint of vodka per day on average. C/w CIWA protocols with po/IV Lorazepam  1-4 mg q1h as needed. C/w Thiamine  100 mg po/IV daily, Folic Acid  1 mg po Daily, MVI + Minerals 1 tab po Daily    Anxiety and Depression: C/w Citalopram  40 mg po Daily and Alrpazolam 1 mg po BID as needed   Insomnia: C/w Trazodone  50 mg po qHS   Tobacco Abuse: Patient reports only smoking a few puffs of a cigarettes here in there, but husband reported that she still smokes about a pack per day on average. C/w Tobacco Cessation Counseling    Hyperlipidemia: Previous lipid panel noted total cholesterol 293, HDL 67, LDL 188, and triglycerides 202. Will consider resuming Rosuvastatin     Hypokalemia: Patient's K+ Level went from 3.2 -> 2.9. Replete w/ po Kcl 40 mEQ + 20 mEQ x1. Also replete w/ IV KPhos 20 mmol. CTM and Replete as Necessary. Repeat CMP in the AM  Hypomagnesemia: Patient's Mag Level is 0.7. Replete w/ IV Mag Sulfate 4 grams. CTM and Replete as Necessary. Repeat Mag in the AM  Hypophosphatemia: Phos Level is now 2.2 Replete with IV KPhos 20 mmol.CTM and Replete as Necessary. Repeat Phos Level in the AM  Elevated AST: Likely Reactive and Chronic and appears similar to prior values. AST is now gone from 61 -> 45. AST to ALT ratio c/w Alcohol Abuse. CTM and Trend and repeat CMP in the AM and if necessary will obtain RUQ U/S and Acute Hepatitis Panel  Normocytic Anemia: Hgb/Hct is now 10.4/32.1. Check Anemia Panel in the AM. CTM for S/S of Bleeding; No overt bleeding noted. Repeat CBC in the AM  GERD/GI Prophylaxis:  Continue pharmacy substitution of PPI w/ Pantoprazole  40 mg BID  Hypoalbuminemia: Patient's Albumin Level went from 3.4 -> 2.4. CTM and Trend and repeat CMP in the AM  Overweight: Complicates overall prognosis and care. Estimated body mass index is 29.41 kg/m as calculated from the following:   Height as of 04/23/23: 5\' 3"  (1.6 m).   Weight as of 04/23/23: 75.3 kg. Weight Loss and Dietary  Counseling given  DVT prophylaxis: enoxaparin  (LOVENOX ) injection 40 mg Start: 10/09/23 1000    Code Status: Full Code Family Communication: No family present @ Bedside  Disposition Plan:  Level of care: Progressive Status is: Inpatient Remains inpatient appropriate because: Needs further clinical improvement and clearance by Specialists   Consultants:  General Surgery PCCM  Procedures:  As delineated as above  Antimicrobials:  Anti-infectives (From admission, onward)    Start     Dose/Rate Route Frequency Ordered Stop   10/09/23 0800  metroNIDAZOLE  (FLAGYL ) IVPB 500 mg        500 mg 100 mL/hr over 60 Minutes Intravenous Every 12 hours 10/08/23 2220     10/09/23 0000  cefTRIAXone  (ROCEPHIN ) 2 g in sodium chloride  0.9 % 100 mL IVPB        2 g 200 mL/hr over 30 Minutes Intravenous Every 24 hours 10/08/23 2220     10/08/23 1730  aztreonam (AZACTAM) 2 g in sodium chloride  0.9 % 100 mL IVPB        2 g 200 mL/hr over 30 Minutes Intravenous  Once 10/08/23 1724 10/08/23 1812   10/08/23  1730  metroNIDAZOLE  (FLAGYL ) IVPB 500 mg        500 mg 100 mL/hr over 60 Minutes Intravenous  Once 10/08/23 1724 10/08/23 1854   10/08/23 1730  vancomycin  (VANCOCIN ) IVPB 1000 mg/200 mL premix        1,000 mg 200 mL/hr over 60 Minutes Intravenous  Once 10/08/23 1724 10/08/23 1954       Subjective: Seen and examined at bedside and she states that she is not feeling well overall and continues to have some abdominal discomfort as well as nausea.  States that she vomited about 3 times this morning.  States that she does not feel well currently.  Denies any chest pain.  States that she continues to still smoke intermittently.  Objective: Vitals:   10/09/23 0811 10/09/23 0819 10/09/23 1145 10/09/23 1210  BP: 93/70  (!) 84/65 94/64  Pulse: 93  88 90  Resp: 17  13 15   Temp: 98.9 F (37.2 C)  98.1 F (36.7 C) 98.5 F (36.9 C)  TempSrc: Oral  Oral Oral  SpO2: 95% 95% 95% 94%    Intake/Output  Summary (Last 24 hours) at 10/09/2023 1336 Last data filed at 10/09/2023 0800 Gross per 24 hour  Intake --  Output 200 ml  Net -200 ml   There were no vitals filed for this visit.  Examination: Physical Exam:  Constitutional: WN/WD overweight older appearing than her stated age Caucasian female who appears uncomfortable and anxious Respiratory: Diminished to auscultation bilaterally with some coarse breath sounds, no wheezing, rales, rhonchi or crackles. Normal respiratory effort and patient is not tachypenic. No accessory muscle use.  Unlabored breathing but is wearing supplemental oxygen via nasal cannula 4 L  Cardiovascular: RRR, no murmurs / rubs / gallops. S1 and S2 auscultated. No extremity edema.  Abdomen: Soft, tender to palpate and distended 2/2 body habitus. Bowel sounds positive.  GU: Deferred. Musculoskeletal: No clubbing / cyanosis of digits/nails. No joint deformity upper and lower extremities.  Skin: No rashes, lesions, ulcers on a limited skin evaluation. No induration; Warm and dry.  Neurologic: CN 2-12 grossly intact with no focal deficits. Romberg sign cerebellar reflexes not assessed.  Psychiatric: Normal judgment and insight. Alert and oriented x 3. Anxious mood and appropriate affect.   Data Reviewed: I have personally reviewed following labs and imaging studies  CBC: Recent Labs  Lab 10/08/23 1633 10/09/23 0442  WBC 11.3* 9.8  NEUTROABS 9.4*  --   HGB 13.0 10.4*  HCT 39.6 32.1*  MCV 95.2 96.4  PLT 213 161   Basic Metabolic Panel: Recent Labs  Lab 10/08/23 1633 10/09/23 0442  NA 138 137  K 3.2* 2.9*  CL 95* 99  CO2 31 32  GLUCOSE 133* 108*  BUN <5* <5*  CREATININE 0.70 0.74  CALCIUM  7.9* 7.8*  MG  --  0.7*  PHOS  --  2.2*   GFR: CrCl cannot be calculated (Unknown ideal weight.). Liver Function Tests: Recent Labs  Lab 10/08/23 1633 10/09/23 0442  AST 61* 45*  ALT 16 13  ALKPHOS 121 78  BILITOT 1.2 1.0  PROT 6.9 5.2*  ALBUMIN  3.4* 2.4*    No results for input(s): "LIPASE", "AMYLASE" in the last 168 hours. No results for input(s): "AMMONIA" in the last 168 hours. Coagulation Profile: Recent Labs  Lab 10/08/23 1633  INR 1.0   Cardiac Enzymes: No results for input(s): "CKTOTAL", "CKMB", "CKMBINDEX", "TROPONINI" in the last 168 hours. BNP (last 3 results) No results for input(s): "PROBNP" in the  last 8760 hours. HbA1C: No results for input(s): "HGBA1C" in the last 72 hours. CBG: No results for input(s): "GLUCAP" in the last 168 hours. Lipid Profile: No results for input(s): "CHOL", "HDL", "LDLCALC", "TRIG", "CHOLHDL", "LDLDIRECT" in the last 72 hours. Thyroid Function Tests: No results for input(s): "TSH", "T4TOTAL", "FREET4", "T3FREE", "THYROIDAB" in the last 72 hours. Anemia Panel: No results for input(s): "VITAMINB12", "FOLATE", "FERRITIN", "TIBC", "IRON", "RETICCTPCT" in the last 72 hours. Sepsis Labs: Recent Labs  Lab 10/08/23 1706 10/08/23 1900 10/09/23 0030 10/09/23 0438  LATICACIDVEN 2.9* 8.7* 2.0* 1.6   Recent Results (from the past 240 hours)  Blood Culture (routine x 2)     Status: None (Preliminary result)   Collection Time: 10/08/23  4:55 PM   Specimen: BLOOD LEFT ARM  Result Value Ref Range Status   Specimen Description BLOOD LEFT ARM  Final   Special Requests   Final    BOTTLES DRAWN AEROBIC AND ANAEROBIC Blood Culture adequate volume   Culture   Final    NO GROWTH < 24 HOURS Performed at [facility], [address] [place], [place] [address]    Report Status PENDING  Incomplete  Blood Culture (routine x 2)     Status: None (Preliminary result)   Collection Time: 10/08/23  5:00 PM   Specimen: BLOOD RIGHT HAND  Result Value Ref Range Status   Specimen Description BLOOD RIGHT HAND  Final   Special Requests   Final    BOTTLES DRAWN AEROBIC AND ANAEROBIC Blood Culture adequate volume   Culture   Final    NO GROWTH < 24 HOURS Performed at [facility], [address]  [place], [place] [address]    Report Status PENDING  Incomplete  Resp panel by RT-PCR (RSV, Flu A&B, Covid) Anterior Nasal Swab     Status: None   Collection Time: 10/08/23  5:33 PM   Specimen: Anterior Nasal Swab  Result Value Ref Range Status   SARS Coronavirus 2 by RT PCR NEGATIVE NEGATIVE Final   Influenza A by PCR NEGATIVE NEGATIVE Final   Influenza B by PCR NEGATIVE NEGATIVE Final    Comment: (NOTE) The Xpert Xpress SARS-CoV-2/FLU/RSV plus assay is intended as an aid in the diagnosis of influenza from Nasopharyngeal swab specimens and should not be used as a sole basis for treatment. Nasal washings and aspirates are unacceptable for Xpert Xpress SARS-CoV-2/FLU/RSV testing.  Fact Sheet for Patients: BloggerCourse.com  Fact Sheet for Healthcare Providers: SeriousBroker.it  This test is not yet approved or cleared by the [place]  FDA and has been authorized for detection and/or diagnosis of SARS-CoV-2 by FDA under an Emergency Use Authorization (EUA). This EUA will remain in effect (meaning this test can be used) for the duration of the COVID-19 declaration under Section 564(b)(1) of the Act, 21 U.S.C. section 360bbb-3(b)(1), unless the authorization is terminated or revoked.     Resp Syncytial Virus by PCR NEGATIVE NEGATIVE Final    Comment: (NOTE) Fact Sheet for Patients: BloggerCourse.com  Fact Sheet for Healthcare Providers: SeriousBroker.it  This test is not yet approved or cleared by the [place]  FDA and has been authorized for detection and/or diagnosis of SARS-CoV-2 by FDA under an Emergency Use Authorization (EUA). This EUA will remain in effect (meaning this test can be used) for the duration of the COVID-19 declaration under Section 564(b)(1) of the Act, 21 U.S.C. section 360bbb-3(b)(1), unless the authorization is terminated or revoked.  Performed at  [facility],  [address], [place], [place] [address]     Radiology Studies: CT ABDOMEN PELVIS W CONTRAST Result Date: 10/08/2023 CLINICAL DATA:  Left lower quadrant pain. EXAM: CT ABDOMEN AND PELVIS WITH CONTRAST TECHNIQUE: Multidetector CT imaging of the abdomen and pelvis was performed using the standard protocol following bolus administration of intravenous contrast. RADIATION DOSE REDUCTION: This exam was performed according to the departmental dose-optimization program which includes automated exposure control, adjustment of the mA and/or kV according to patient size and/or use of iterative reconstruction technique. CONTRAST:  75mL OMNIPAQUE  IOHEXOL  350 MG/ML SOLN COMPARISON:  December 22, 2022 FINDINGS: Lower chest: Mild areas of lingular, right middle lobe and bibasilar linear scarring and/or atelectasis are seen. Hepatobiliary: There is diffuse fatty infiltration of the liver parenchyma. A 1.7 cm x 2.1 cm x 3.0 cm isodense liver lesion is seen within segment IV of the right lobe of the liver (approximately 45.15 Hounsfield units). The parenchyma throughout the remainder of the liver measures approximately 54.93 Hounsfield units. This area is surrounded by a very thin hyperdense rim and is not clearly visualized on the prior study. Status post cholecystectomy. No biliary dilatation. Pancreas: Unremarkable. No pancreatic ductal dilatation or surrounding inflammatory changes. Spleen: Normal in size without focal abnormality. Adrenals/Urinary Tract: Adrenal glands are unremarkable. Kidneys are normal, without renal calculi, focal lesion, or hydronephrosis. Bladder is unremarkable. Stomach/Bowel: Stomach is within normal limits. The appendix is surgically absent. No evidence of bowel dilatation. Numerous diverticula are seen throughout the descending and sigmoid colon. The mid sigmoid colon is also markedly thickened and inflamed. Mildly inflamed loops of adjacent distal ileum are seen. There is no  evidence of associated perforation or abscess. Vascular/Lymphatic: Aortic atherosclerosis. No enlarged abdominal or pelvic lymph nodes. Reproductive: Status post hysterectomy. No adnexal masses. Other: No abdominal wall hernia or abnormality. There is a small amount of pelvic free fluid. Musculoskeletal: Degenerative changes are seen within the lower lumbar spine. IMPRESSION: 1. Marked severity sigmoid diverticulitis without associated perforation or abscess. 2. Mildly inflamed loops of adjacent distal ileum. 3. Hepatic steatosis. 4. Isodense liver lesion, as described above, which represents a new finding when compared to the prior study. Nonemergent MRI follow-up is recommended to further exclude the presence of an underlying neoplastic process. 5. Evidence of prior cholecystectomy, appendectomy and hysterectomy. 6. Aortic atherosclerosis. Electronically Signed   By: Virgle Grime M.D.   On: 10/08/2023 20:45   DG Chest Port 1 View Result Date: 10/08/2023 CLINICAL DATA:  Questionable sepsis - evaluate for abnormality Abdominal and back pain. EXAM: PORTABLE CHEST 1 VIEW COMPARISON:  Chest CT 07/11/2022 FINDINGS: Chronic hyperinflation and bronchial thickening. No focal airspace disease. The heart is normal in size. No pulmonary edema, pleural effusion, or pneumothorax. No acute osseous findings IMPRESSION: Chronic hyperinflation and bronchial thickening. No acute findings. Electronically Signed   By: Chadwick Colonel M.D.   On: 10/08/2023 18:22   Scheduled Meds:  acetaminophen   1,000 mg Oral Q6H   budesonide -glycopyrrolate -formoterol   2 puff Inhalation BID   citalopram   40 mg Oral Daily   enoxaparin  (LOVENOX ) injection  40 mg Subcutaneous Q24H   fentaNYL  (SUBLIMAZE ) injection  50 mcg Intravenous Once   folic acid   1 mg Oral Daily   ipratropium-albuterol   3 mL Nebulization BID   ketorolac   30 mg Intravenous Q6H   magnesium  oxide  400 mg Oral QHS   methocarbamol  1,000 mg Oral Q8H   multivitamin  with minerals  1 tablet Oral Daily   pantoprazole   40 mg  Oral BID   potassium chloride  SA  20 mEq Oral Daily   potassium chloride   40 mEq Oral Once   rOPINIRole   1 mg Oral QHS   sodium chloride  flush  3 mL Intravenous Q12H   thiamine   100 mg Oral Daily   Or   thiamine   100 mg Intravenous Daily   traZODone   50 mg Oral QHS   Continuous Infusions:  cefTRIAXone  (ROCEPHIN )  IV 2 g (10/09/23 0014)   metronidazole  Stopped (10/09/23 1030)   potassium PHOSPHATE IVPB (in mmol) 20 mmol (10/09/23 1030)    LOS: 1 day   Aura Leeds, DO [facility] Available via Epic secure chat 7am-7pm After these hours, please refer to coverage provider listed on [email or web address] 10/09/2023, 1:36 PM

## 2023-10-09 NOTE — Progress Notes (Signed)
   10/09/23 1550  Provider Notification  Provider Name/Title Dr. Gillermo Lack  Date Provider Notified 10/09/23  Time Provider Notified 1550  Method of Notification  (secure chat)  Notification Reason Change in status (pt's BP 85/57 (66) post albumin  infusion. Pt requesting an update from the doctor.)  Provider response See new orders  Date of Provider Response 10/09/23  Time of Provider Response 1551   Pts BP continues to be soft with MAP >65. Pt denies feeling dizzy or being lightheaded. Pt expresses concern for her low BP, and would like an update from the MD. Dr. Gillermo Lack notified, new orders received for Midodrine .

## 2023-10-09 NOTE — Consult Note (Addendum)
 Reason for Consult/Chief Complaint: diverticulitis Consultant: Tegeler, MD  Vicki [name] is an 58 y.o. female.   HPI: 58F with COPD on home O2, multiple prior episodes of diverticulitis, and prior C dif colitis presented in sepsis 2/2 diverticulitis. Empiric abx were started and the patient was admitted to internal medicine. Her lactic acid was as high as 8.7, but has since normalized. Mild leukocytosis at 11.3. To me, patient denies n/v, reports last colonoscopy 2-3y ago by [place], and had a liquid, non-bloody bowel movement while I was in the room. Her surgical history is notable for hysterectomy, appendectomy, and cholecystectomy.   Past Medical History:  Diagnosis Date   Allergy    seasonal allergies   Anxiety    on meds   Arthritis    hands   Asthma    childhood-current   CAP (community acquired pneumonia) 04/07/2019   Carpal tunnel syndrome    had bilateral sx to tx   Cataract    bilateral--no sx yet   COPD (chronic obstructive pulmonary disease) (HCC)    Depression    on meds   Diverticulitis    Emphysema of lung (HCC)    uses MDI   Essential hypertension, benign 07/14/2019   on meds   Hyperlipidemia    on meds   Oxygen deficiency    on 5 L cont [place]   Oxygen dependent    on 5 L cont [place]    Past Surgical History:  Procedure Laterality Date   ABDOMINAL HYSTERECTOMY  2009   APPENDECTOMY     ARTHROSCOPIC REPAIR ACL Left 2011/2014   x 2   BIOPSY  01/21/2020   Procedure: BIOPSY;  Surgeon: Sergio Dandy, MD;  Location: [facility];  Service: Endoscopy;;  EGD and COLON   BUNIONECTOMY  2006   both   CARPAL TUNNEL RELEASE Bilateral 10/28/2013   Procedure: BILATERAL CARPAL TUNNEL RELEASE;  Surgeon: Kemp Patter, MD;  Location: [facility];  Service: Orthopedics;  Laterality: Bilateral;   CHOLECYSTECTOMY  1994   CHONDROPLASTY Right 08/31/2020   Procedure: CHONDROPLASTY;  Surgeon: Marlena Sima, MD;  Location: [facility];   Service: Orthopedics;  Laterality: Right;   COLONOSCOPY WITH PROPOFOL  N/A 01/21/2020   Procedure: COLONOSCOPY WITH PROPOFOL ;  Surgeon: Sergio Dandy, MD;  Location: [facility];  Service: Endoscopy;  Laterality: N/A;   DILATION AND CURETTAGE OF UTERUS     ESOPHAGOGASTRODUODENOSCOPY (EGD) WITH PROPOFOL  N/A 01/21/2020   Procedure: ESOPHAGOGASTRODUODENOSCOPY (EGD) WITH PROPOFOL ;  Surgeon: Sergio Dandy, MD;  Location: [facility];  Service: Endoscopy;  Laterality: N/A;   KNEE ARTHROSCOPY WITH ANTERIOR CRUCIATE LIGAMENT (ACL) REPAIR Right 08/31/2020   Procedure: RIGHT KNEE ARTHROSCOPY WITH ANTERIOR CRUCIATE LIGAMENT (ACL) REPAIR MEDIAL AND LATERAL  MENISECTOMY;  Surgeon: Marlena Sima, MD;  Location:  SURGERY CENTER;  Service: Orthopedics;  Laterality: Right;  FEMORAL NERVE BLOCK   POLYPECTOMY  01/21/2020   Procedure: POLYPECTOMY;  Surgeon: Sergio Dandy, MD;  Location: [facility];  Service: Endoscopy;;   TUBAL LIGATION  2004    Family History  Problem Relation Age of Onset   Brain cancer Mother 37   Lung cancer Mother 94   Breast cancer Sister 66   Ovarian cancer Sister 42   Colon cancer Neg Hx    Colon polyps Neg Hx    Esophageal cancer Neg Hx    Rectal cancer Neg Hx    Stomach cancer Neg Hx     Social History:  reports that she has been smoking cigarettes. She has a 34 pack-year smoking history. She has never used smokeless tobacco. She reports current alcohol use. She reports that she does not use drugs.  Allergies:  Allergies  Allergen Reactions   Jasmine Oil Shortness Of Breath and Swelling   Levaquin  [Levofloxacin ] Shortness Of Breath, Itching, Swelling and Rash   Penicillins Anaphylaxis and Hives    Tolerated ceftriaxone    Aleve [Naproxen Sodium] Itching   Ceftin [Cefuroxime Axetil] Other (See Comments)    unsure   Chlorhexidine      Unknown reaction   Depo-Provera [Medroxyprogesterone Acetate] Other (See Comments)    Severe acne   Doxycycline   Other (See Comments)    Raises blood pressure    Tramadol Other (See Comments)    Kept her up all night    Medications: I have reviewed the patient's current medications.  Results for orders placed or performed during the hospital encounter of 10/08/23 (from the past 48 hours)  Comprehensive metabolic panel     Status: Abnormal   Collection Time: 10/08/23  4:33 PM  Result Value Ref Range   Sodium 138 135 - 145 mmol/L   Potassium 3.2 (L) 3.5 - 5.1 mmol/L   Chloride 95 (L) 98 - 111 mmol/L   CO2 31 22 - 32 mmol/L   Glucose, Bld 133 (H) 70 - 99 mg/dL    Comment: Glucose reference range applies only to samples taken after fasting for at least 8 hours.   BUN <5 (L) 6 - 20 mg/dL   Creatinine, Ser 4.09 0.44 - 1.00 mg/dL   Calcium  7.9 (L) 8.9 - 10.3 mg/dL   Total Protein 6.9 6.5 - 8.1 g/dL   Albumin 3.4 (L) 3.5 - 5.0 g/dL   AST 61 (H) 15 - 41 U/L   ALT 16 0 - 44 U/L   Alkaline Phosphatase 121 38 - 126 U/L   Total Bilirubin 1.2 0.0 - 1.2 mg/dL   GFR, Estimated >81 >19 mL/min    Comment: (NOTE) Calculated using the CKD-EPI Creatinine Equation (2021)    Anion gap 12 5 - 15    Comment: Performed at [facility], [address] [place], [place] [address]  CBC with Differential     Status: Abnormal   Collection Time: 10/08/23  4:33 PM  Result Value Ref Range   WBC 11.3 (H) 4.0 - 10.5 K/uL   RBC 4.16 3.87 - 5.11 MIL/uL   Hemoglobin 13.0 12.0 - 15.0 g/dL   HCT 95.6 21.3 - 08.6 %   MCV 95.2 80.0 - 100.0 fL   MCH 31.3 26.0 - 34.0 pg   MCHC 32.8 30.0 - 36.0 g/dL   RDW 57.8 46.9 - 62.9 %   Platelets 213 150 - 400 K/uL    Comment: REPEATED TO VERIFY   nRBC 0.0 0.0 - 0.2 %   Neutrophils Relative % 84 %   Neutro Abs 9.4 (H) 1.7 - 7.7 K/uL   Lymphocytes Relative 8 %   Lymphs Abs 0.9 0.7 - 4.0 K/uL   Monocytes Relative 8 %   Monocytes Absolute 0.9 0.1 - 1.0 K/uL   Eosinophils Relative 0 %   Eosinophils Absolute 0.0 0.0 - 0.5 K/uL   Basophils Relative 0 %   Basophils Absolute 0.0  0.0 - 0.1 K/uL   Immature Granulocytes 0 %   Abs Immature Granulocytes 0.05 0.00 - 0.07 K/uL    Comment: Performed at [facility], [address], [place], [place]  [address]  Protime-INR     Status: None   Collection Time: 10/08/23  4:33 PM  Result Value Ref Range   Prothrombin Time 13.6 11.4 - 15.2 seconds   INR 1.0 0.8 - 1.2    Comment: (NOTE) INR goal varies based on device and disease states. Performed at [facility], [address], [place], [place] [address]   Troponin I (High Sensitivity)     Status: None   Collection Time: 10/08/23  4:33 PM  Result Value Ref Range   Troponin I (High Sensitivity) 11 <18 ng/L    Comment: (NOTE) Elevated high sensitivity troponin I (hsTnI) values and significant  changes across serial measurements may suggest ACS but many other  chronic and acute conditions are known to elevate hsTnI results.  Refer to the "Links" section for chest pain algorithms and additional  guidance. Performed at [facility], [address], [place], [place] [address]   Urinalysis, w/ Reflex to Culture (Infection Suspected) -Urine, Clean Catch     Status: Abnormal   Collection Time: 10/08/23  4:34 PM  Result Value Ref Range   Specimen Source URINE, CLEAN CATCH    Color, Urine AMBER (A) YELLOW    Comment: BIOCHEMICALS MAY BE AFFECTED BY COLOR   APPearance HAZY (A) CLEAR   Specific Gravity, Urine 1.032 (H) 1.005 - 1.030   pH 5.0 5.0 - 8.0   Glucose, UA NEGATIVE NEGATIVE mg/dL   Hgb urine dipstick NEGATIVE NEGATIVE   Bilirubin Urine NEGATIVE NEGATIVE   Ketones, ur 5 (A) NEGATIVE mg/dL   Protein, ur 30 (A) NEGATIVE mg/dL   Nitrite NEGATIVE NEGATIVE   Leukocytes,Ua NEGATIVE NEGATIVE   RBC / HPF 0-5 0 - 5 RBC/hpf   WBC, UA 0-5 0 - 5 WBC/hpf    Comment:        Reflex urine culture not performed if WBC <=10, OR if Squamous epithelial cells >5. If Squamous epithelial cells >5 suggest recollection.    Bacteria, UA RARE (A) NONE SEEN   Squamous  Epithelial / HPF 6-10 0 - 5 /HPF   Mucus PRESENT    Hyaline Casts, UA PRESENT     Comment: Performed at [facility], [address] [place], [place] [address]  I-Stat Lactic Acid, ED     Status: Abnormal   Collection Time: 10/08/23  5:06 PM  Result Value Ref Range   Lactic Acid, Venous 2.9 (HH) 0.5 - 1.9 mmol/L   Comment NOTIFIED PHYSICIAN   Resp panel by RT-PCR (RSV, Flu A&B, Covid) Anterior Nasal Swab     Status: None   Collection Time: 10/08/23  5:33 PM   Specimen: Anterior Nasal Swab  Result Value Ref Range   SARS Coronavirus 2 by RT PCR NEGATIVE NEGATIVE   Influenza A by PCR NEGATIVE NEGATIVE   Influenza B by PCR NEGATIVE NEGATIVE    Comment: (NOTE) The Xpert Xpress SARS-CoV-2/FLU/RSV plus assay is intended as an aid in the diagnosis of influenza from Nasopharyngeal swab specimens and should not be used as a sole basis for treatment. Nasal washings and aspirates are unacceptable for Xpert Xpress SARS-CoV-2/FLU/RSV testing.  Fact Sheet for Patients: BloggerCourse.com  Fact Sheet for Healthcare Providers: SeriousBroker.it  This test is not yet approved or cleared by the [place]  FDA and has been authorized for detection and/or diagnosis of SARS-CoV-2 by FDA under an Emergency Use Authorization (EUA). This EUA will remain in effect (meaning this test can be used) for the duration of  the COVID-19 declaration under Section 564(b)(1) of the Act, 21 U.S.C. section 360bbb-3(b)(1), unless the authorization is terminated or revoked.     Resp Syncytial Virus by PCR NEGATIVE NEGATIVE    Comment: (NOTE) Fact Sheet for Patients: BloggerCourse.com  Fact Sheet for Healthcare Providers: SeriousBroker.it  This test is not yet approved or cleared by the [place]  FDA and has been authorized for detection and/or diagnosis of SARS-CoV-2 by FDA under an Emergency Use  Authorization (EUA). This EUA will remain in effect (meaning this test can be used) for the duration of the COVID-19 declaration under Section 564(b)(1) of the Act, 21 U.S.C. section 360bbb-3(b)(1), unless the authorization is terminated or revoked.  Performed at [facility], [address], [place], [place] [address]   Troponin I (High Sensitivity)     Status: None   Collection Time: 10/08/23  6:48 PM  Result Value Ref Range   Troponin I (High Sensitivity) 5 <18 ng/L    Comment: (NOTE) Elevated high sensitivity troponin I (hsTnI) values and significant  changes across serial measurements may suggest ACS but many other  chronic and acute conditions are known to elevate hsTnI results.  Refer to the "Links" section for chest pain algorithms and additional  guidance. Performed at [facility], [address], [place], [place] [address]   I-Stat Lactic Acid, ED     Status: Abnormal   Collection Time: 10/08/23  7:00 PM  Result Value Ref Range   Lactic Acid, Venous 8.7 (HH) 0.5 - 1.9 mmol/L   Comment NOTIFIED PHYSICIAN   HIV Antibody (routine testing w rflx)     Status: None   Collection Time: 10/09/23 12:30 AM  Result Value Ref Range   HIV Screen 4th Generation wRfx Non Reactive Non Reactive    Comment: Performed at [facility], [address] [place], [place] [address]  Lactic acid, plasma     Status: Abnormal   Collection Time: 10/09/23 12:30 AM  Result Value Ref Range   Lactic Acid, Venous 2.0 (HH) 0.5 - 1.9 mmol/L    Comment: CRITICAL RESULT CALLED TO, READ BACK BY AND VERIFIED WITH Y. ROFA, RN AT 0110 05.28.25 JLASIGAN Performed at [facility], [address] [place], [place] [address]   Lactic acid, plasma     Status: None   Collection Time: 10/09/23  4:38 AM  Result Value Ref Range   Lactic Acid, Venous 1.6 0.5 - 1.9 mmol/L    Comment: Performed at [facility], [address], [place], [place] [address]  CBC     Status: Abnormal    Collection Time: 10/09/23  4:42 AM  Result Value Ref Range   WBC 9.8 4.0 - 10.5 K/uL   RBC 3.33 (L) 3.87 - 5.11 MIL/uL   Hemoglobin 10.4 (L) 12.0 - 15.0 g/dL   HCT 78.4 (L) 69.6 - 29.5 %   MCV 96.4 80.0 - 100.0 fL   MCH 31.2 26.0 - 34.0 pg   MCHC 32.4 30.0 - 36.0 g/dL   RDW 28.4 13.2 - 44.0 %   Platelets 161 150 - 400 K/uL   nRBC 0.0 0.0 - 0.2 %    Comment: Performed at [facility], [address], [place], [place] [address]  Comprehensive metabolic panel     Status: Abnormal   Collection Time: 10/09/23  4:42 AM  Result Value Ref Range   Sodium 137 135 - 145 mmol/L   Potassium 2.9 (L) 3.5 -  5.1 mmol/L   Chloride 99 98 - 111 mmol/L   CO2 32 22 - 32 mmol/L   Glucose, Bld 108 (H) 70 - 99 mg/dL    Comment: Glucose reference range applies only to samples taken after fasting for at least 8 hours.   BUN <5 (L) 6 - 20 mg/dL   Creatinine, Ser 6.57 0.44 - 1.00 mg/dL   Calcium  7.8 (L) 8.9 - 10.3 mg/dL   Total Protein 5.2 (L) 6.5 - 8.1 g/dL   Albumin 2.4 (L) 3.5 - 5.0 g/dL   AST 45 (H) 15 - 41 U/L   ALT 13 0 - 44 U/L   Alkaline Phosphatase 78 38 - 126 U/L   Total Bilirubin 1.0 0.0 - 1.2 mg/dL   GFR, Estimated >84 >69 mL/min    Comment: (NOTE) Calculated using the CKD-EPI Creatinine Equation (2021)    Anion gap 6 5 - 15    Comment: Performed at [facility], [address] [place], [place] [address]  Magnesium      Status: Abnormal   Collection Time: 10/09/23  4:42 AM  Result Value Ref Range   Magnesium  0.7 (LL) 1.7 - 2.4 mg/dL    Comment: FIRST CALL ATTEMPT AT 0525 CRITICAL RESULT CALLED TO, READ BACK BY AND VERIFIED WITH Jerone Moorman, RN AT [phone] 05.28.25 JLASIGAN Performed at [facility], [address], [place], [place] [address]   Phosphorus     Status: Abnormal   Collection Time: 10/09/23  4:42 AM  Result Value Ref Range   Phosphorus 2.2 (L) 2.5 - 4.6 mg/dL    Comment: Performed at [facility], [address], [place], [place] [address]    CT ABDOMEN  PELVIS W CONTRAST Result Date: 10/08/2023 CLINICAL DATA:  Left lower quadrant pain. EXAM: CT ABDOMEN AND PELVIS WITH CONTRAST TECHNIQUE: Multidetector CT imaging of the abdomen and pelvis was performed using the standard protocol following bolus administration of intravenous contrast. RADIATION DOSE REDUCTION: This exam was performed according to the departmental dose-optimization program which includes automated exposure control, adjustment of the mA and/or kV according to patient size and/or use of iterative reconstruction technique. CONTRAST:  75mL OMNIPAQUE  IOHEXOL  350 MG/ML SOLN COMPARISON:  December 22, 2022 FINDINGS: Lower chest: Mild areas of lingular, right middle lobe and bibasilar linear scarring and/or atelectasis are seen. Hepatobiliary: There is diffuse fatty infiltration of the liver parenchyma. A 1.7 cm x 2.1 cm x 3.0 cm isodense liver lesion is seen within segment IV of the right lobe of the liver (approximately 45.15 Hounsfield units). The parenchyma throughout the remainder of the liver measures approximately 54.93 Hounsfield units. This area is surrounded by a very thin hyperdense rim and is not clearly visualized on the prior study. Status post cholecystectomy. No biliary dilatation. Pancreas: Unremarkable. No pancreatic ductal dilatation or surrounding inflammatory changes. Spleen: Normal in size without focal abnormality. Adrenals/Urinary Tract: Adrenal glands are unremarkable. Kidneys are normal, without renal calculi, focal lesion, or hydronephrosis. Bladder is unremarkable. Stomach/Bowel: Stomach is within normal limits. The appendix is surgically absent. No evidence of bowel dilatation. Numerous diverticula are seen throughout the descending and sigmoid colon. The mid sigmoid colon is also markedly thickened and inflamed. Mildly inflamed loops of adjacent distal ileum are seen. There is no evidence of associated perforation or abscess. Vascular/Lymphatic: Aortic atherosclerosis. No enlarged  abdominal or pelvic lymph nodes. Reproductive: Status post hysterectomy. No adnexal masses. Other: No abdominal wall hernia or abnormality. There is a small amount of pelvic  free fluid. Musculoskeletal: Degenerative changes are seen within the lower lumbar spine. IMPRESSION: 1. Marked severity sigmoid diverticulitis without associated perforation or abscess. 2. Mildly inflamed loops of adjacent distal ileum. 3. Hepatic steatosis. 4. Isodense liver lesion, as described above, which represents a new finding when compared to the prior study. Nonemergent MRI follow-up is recommended to further exclude the presence of an underlying neoplastic process. 5. Evidence of prior cholecystectomy, appendectomy and hysterectomy. 6. Aortic atherosclerosis. Electronically Signed   By: Virgle Grime M.D.   On: 10/08/2023 20:45   DG Chest Port 1 View Result Date: 10/08/2023 CLINICAL DATA:  Questionable sepsis - evaluate for abnormality Abdominal and back pain. EXAM: PORTABLE CHEST 1 VIEW COMPARISON:  Chest CT 07/11/2022 FINDINGS: Chronic hyperinflation and bronchial thickening. No focal airspace disease. The heart is normal in size. No pulmonary edema, pleural effusion, or pneumothorax. No acute osseous findings IMPRESSION: Chronic hyperinflation and bronchial thickening. No acute findings. Electronically Signed   By: Chadwick Colonel M.D.   On: 10/08/2023 18:22    ROS 10 point review of systems is negative except as listed above in HPI.   Physical Exam Blood pressure 99/64, pulse (!) 110, temperature 99 F (37.2 C), temperature source Oral, resp. rate 20, SpO2 97%. Constitutional: well-developed, well-nourished HEENT: pupils equal, round, reactive to light, 2mm b/l, moist conjunctiva, external inspection of ears and nose normal, hearing intact Oropharynx: normal oropharyngeal mucosa, normal dentition Neck: no thyromegaly, trachea midline, no midline cervical tenderness to palpation Chest: breath sounds equal  bilaterally, normal respiratory effort, no midline or lateral chest wall tenderness to palpation/deformity Abdomen: soft, exquisite RLQ TTP, no bruising, no hepatosplenomegaly GU: normal female genitalia  Skin: warm, dry, no rashes Psych: normal memory, normal mood/affect     Assessment/Plan: Diverticulitis - no perforation or abscess on CT, recommend continuing abx. WBC and LA have both normalized. No n/v and currently having BMs. Continue abx, trend WBC and abdominal exam. I think she is okay for CLD. Would recommend colonoscopy at 6w post-resolution of this episode. Given history of multiple episodes of diverticulitis, would recommend consideration of elective sigmoidectomy if she can get through this hospitalization without surgery. She would be at increased surgical risk given her baseline pulmonary status.  FEN - CLD okay from my standpoint DVT - SCDs, LMWH Dispo - per primary    Anda Bamberg, MD General and Trauma Surgery [facility]

## 2023-10-09 NOTE — Progress Notes (Addendum)
 eLink Physician-Brief Progress Note Patient Name: Vicki [name] DOB: [birth year] MRN: [id number]   Date of Service  10/09/2023  HPI/Events of Note  LA 5.2 Cr normal, co2 at 16  7.45/46/57 from from noon time.  Camera:  On 4 lit nasal o2. On levo at 3 mcg via PIV. Sinus tachy. Nausea for a while. Per RN discussion.  Urine out put ok. Mental status is fine. Getting LR at 100 ml/hr.  S/p 3 lit fluids already.  CT abdomen: IMPRESSION: 1. Marked severity sigmoid diverticulitis without associated perforation or abscess. 2. Mildly inflamed loops of adjacent distal ileum. 3. Hepatic steatosis. 4. Isodense liver lesion, as described above, which represents a new finding when compared to the prior study. Nonemergent MRI follow-up is recommended to further exclude the presence of an underlying neoplastic process. 5. Evidence of prior cholecystectomy, appendectomy and hysterectomy. 6. Aortic atherosclerosis.   Already seen by Gen surgery.  2021: echo EF 50%  eICU Interventions  Elevated LA, could be delayed clearance of LA from Hepatic steatosis. Saline 500 ml bolus. Watch for abdominal distension pain. If so need KUB/CxR for any free air. If LA still going up, need repeat Ct abdomen for any worsening or perforations and Gen surgery follow through.  Get LDH      Intervention Category Intermediate Interventions: Other:  Rexann Catalan 10/09/2023, 10:05 PM  22:52 pt is getting oral magnesium  oxide. and current Mag is 2.5. Nurse reached out to Rx and asked to hold it or not due to the nature of it increasing her mag level - stop mag oxide for now

## 2023-10-10 ENCOUNTER — Other Ambulatory Visit: Payer: Self-pay

## 2023-10-10 ENCOUNTER — Inpatient Hospital Stay (HOSPITAL_COMMUNITY): Admitting: Certified Registered Nurse Anesthetist

## 2023-10-10 ENCOUNTER — Encounter (HOSPITAL_COMMUNITY)
Admission: EM | Disposition: A | Discharge status: Home Health | Payer: Self-pay | Source: Home / Self Care | Attending: Internal Medicine

## 2023-10-10 ENCOUNTER — Encounter (HOSPITAL_COMMUNITY): Payer: Self-pay | Admitting: Internal Medicine

## 2023-10-10 DIAGNOSIS — J449 Chronic obstructive pulmonary disease, unspecified: Secondary | ICD-10-CM

## 2023-10-10 DIAGNOSIS — K5732 Diverticulitis of large intestine without perforation or abscess without bleeding: Secondary | ICD-10-CM

## 2023-10-10 DIAGNOSIS — R6521 Severe sepsis with septic shock: Secondary | ICD-10-CM | POA: Diagnosis not present

## 2023-10-10 DIAGNOSIS — A419 Sepsis, unspecified organism: Secondary | ICD-10-CM | POA: Diagnosis not present

## 2023-10-10 DIAGNOSIS — K5721 Diverticulitis of large intestine with perforation and abscess with bleeding: Secondary | ICD-10-CM

## 2023-10-10 DIAGNOSIS — K650 Generalized (acute) peritonitis: Secondary | ICD-10-CM | POA: Diagnosis not present

## 2023-10-10 DIAGNOSIS — I10 Essential (primary) hypertension: Secondary | ICD-10-CM

## 2023-10-10 HISTORY — PX: LAPAROTOMY: SHX154

## 2023-10-10 LAB — POCT I-STAT 7, (LYTES, BLD GAS, ICA,H+H)
Acid-base deficit: 6 mmol/L — ABNORMAL HIGH (ref 0.0–2.0)
Bicarbonate: 22.6 mmol/L (ref 20.0–28.0)
Calcium, Ion: 1.1 mmol/L — ABNORMAL LOW (ref 1.15–1.40)
HCT: 30 % — ABNORMAL LOW (ref 36.0–46.0)
Hemoglobin: 10.2 g/dL — ABNORMAL LOW (ref 12.0–15.0)
O2 Saturation: 100 %
Patient temperature: 37.1
Potassium: 3.8 mmol/L (ref 3.5–5.1)
Sodium: 134 mmol/L — ABNORMAL LOW (ref 135–145)
TCO2: 24 mmol/L (ref 22–32)
pCO2 arterial: 58.1 mmHg — ABNORMAL HIGH (ref 32–48)
pH, Arterial: 7.198 — CL (ref 7.35–7.45)
pO2, Arterial: 226 mmHg — ABNORMAL HIGH (ref 83–108)

## 2023-10-10 LAB — CBC WITH DIFFERENTIAL/PLATELET
Abs Immature Granulocytes: 0 10*3/uL (ref 0.00–0.07)
Basophils Absolute: 0.2 10*3/uL — ABNORMAL HIGH (ref 0.0–0.1)
Basophils Relative: 1 %
Eosinophils Absolute: 0 10*3/uL (ref 0.0–0.5)
Eosinophils Relative: 0 %
HCT: 30.1 % — ABNORMAL LOW (ref 36.0–46.0)
Hemoglobin: 9.9 g/dL — ABNORMAL LOW (ref 12.0–15.0)
Lymphocytes Relative: 4 %
Lymphs Abs: 0.7 10*3/uL (ref 0.7–4.0)
MCH: 31.8 pg (ref 26.0–34.0)
MCHC: 32.9 g/dL (ref 30.0–36.0)
MCV: 96.8 fL (ref 80.0–100.0)
Monocytes Absolute: 0.5 10*3/uL (ref 0.1–1.0)
Monocytes Relative: 3 %
Neutro Abs: 15.7 10*3/uL — ABNORMAL HIGH (ref 1.7–7.7)
Neutrophils Relative %: 92 %
Platelets: 200 10*3/uL (ref 150–400)
RBC: 3.11 MIL/uL — ABNORMAL LOW (ref 3.87–5.11)
RDW: 13.2 % (ref 11.5–15.5)
WBC: 17.1 10*3/uL — ABNORMAL HIGH (ref 4.0–10.5)
nRBC: 0 % (ref 0.0–0.2)
nRBC: 0 /100{WBCs}

## 2023-10-10 LAB — COMPREHENSIVE METABOLIC PANEL WITH GFR
ALT: 12 U/L (ref 0–44)
AST: 34 U/L (ref 15–41)
Albumin: 2.5 g/dL — ABNORMAL LOW (ref 3.5–5.0)
Alkaline Phosphatase: 55 U/L (ref 38–126)
Anion gap: 9 (ref 5–15)
BUN: <5 mg/dL — ABNORMAL LOW (ref 6–20)
CO2: 23 mmol/L (ref 22–32)
Calcium: 7.5 mg/dL — ABNORMAL LOW (ref 8.9–10.3)
Chloride: 101 mmol/L (ref 98–111)
Creatinine, Ser: 0.79 mg/dL (ref 0.44–1.00)
GFR, Estimated: >60 mL/min (ref >60)
Glucose, Bld: 108 mg/dL — ABNORMAL HIGH (ref 70–99)
Potassium: 3.6 mmol/L (ref 3.5–5.1)
Sodium: 133 mmol/L — ABNORMAL LOW (ref 135–145)
Total Bilirubin: 1.2 mg/dL (ref 0.0–1.2)
Total Protein: 5 g/dL — ABNORMAL LOW (ref 6.5–8.1)

## 2023-10-10 LAB — PHOSPHORUS: Phosphorus: 2.5 mg/dL (ref 2.5–4.6)

## 2023-10-10 LAB — LACTIC ACID, PLASMA: Lactic Acid, Venous: 4.5 mmol/L (ref 0.5–1.9)

## 2023-10-10 LAB — LACTATE DEHYDROGENASE: LDH: 151 U/L (ref 98–192)

## 2023-10-10 LAB — TYPE AND SCREEN
ABO/RH(D): O NEG
Antibody Screen: NEGATIVE

## 2023-10-10 LAB — GLUCOSE, CAPILLARY
Glucose-Capillary: 118 mg/dL — ABNORMAL HIGH (ref 70–99)
Glucose-Capillary: 103 mg/dL — ABNORMAL HIGH (ref 70–99)
Glucose-Capillary: 125 mg/dL — ABNORMAL HIGH (ref 70–99)
Glucose-Capillary: 133 mg/dL — ABNORMAL HIGH (ref 70–99)
Glucose-Capillary: 156 mg/dL — ABNORMAL HIGH (ref 70–99)
Glucose-Capillary: 151 mg/dL — ABNORMAL HIGH (ref 70–99)

## 2023-10-10 LAB — ABO/RH: ABO/RH(D): O NEG

## 2023-10-10 SURGERY — LAPAROTOMY, EXPLORATORY
Anesthesia: General

## 2023-10-10 MED ORDER — ONDANSETRON HCL 4 MG/2ML IJ SOLN
INTRAMUSCULAR | Status: AC
  Filled 2023-10-10: qty 2

## 2023-10-10 MED ORDER — LIDOCAINE 2% (20 MG/ML) 5 ML SYRINGE
INTRAMUSCULAR | Status: DC | PRN
  Administered 2023-10-10: 60 mg via INTRAVENOUS

## 2023-10-10 MED ORDER — FENTANYL CITRATE (PF) 250 MCG/5ML IJ SOLN
INTRAMUSCULAR | Status: AC
  Filled 2023-10-10: qty 5

## 2023-10-10 MED ORDER — MIDAZOLAM HCL 2 MG/2ML IJ SOLN
1.0000 mg | INTRAMUSCULAR | Status: DC | PRN
  Administered 2023-10-10 (×2): 2 mg via INTRAVENOUS
  Filled 2023-10-10 (×3): qty 2

## 2023-10-10 MED ORDER — FENTANYL 2500MCG IN NS 250ML (10MCG/ML) PREMIX INFUSION
0.0000 ug/h | INTRAVENOUS | Status: DC
  Administered 2023-10-10: 50 ug/h via INTRAVENOUS
  Filled 2023-10-10: qty 250

## 2023-10-10 MED ORDER — PROPOFOL 10 MG/ML IV BOLUS
INTRAVENOUS | Status: DC | PRN
  Administered 2023-10-10: 120 mg via INTRAVENOUS
  Administered 2023-10-10: 25 ug/kg/min via INTRAVENOUS

## 2023-10-10 MED ORDER — FENTANYL 2500MCG IN NS 250ML (10MCG/ML) PREMIX INFUSION: 0.0000 ug/h | INTRAVENOUS | Status: DC

## 2023-10-10 MED ORDER — FENTANYL CITRATE PF 50 MCG/ML IJ SOSY
25.0000 ug | PREFILLED_SYRINGE | INTRAMUSCULAR | Status: DC | PRN
  Administered 2023-10-10 – 2023-10-11 (×3): 25 ug via INTRAVENOUS
  Filled 2023-10-10 (×3): qty 1

## 2023-10-10 MED ORDER — ENOXAPARIN SODIUM 40 MG/0.4ML IJ SOSY
40.0000 mg | PREFILLED_SYRINGE | INTRAMUSCULAR | Status: DC
  Administered 2023-10-11 – 2023-10-19 (×9): 40 mg via SUBCUTANEOUS
  Filled 2023-10-10 (×9): qty 0.4

## 2023-10-10 MED ORDER — DEXAMETHASONE SODIUM PHOSPHATE 10 MG/ML IJ SOLN
INTRAMUSCULAR | Status: DC | PRN
  Administered 2023-10-10: 10 mg via INTRAVENOUS

## 2023-10-10 MED ORDER — PANTOPRAZOLE SODIUM 40 MG IV SOLR
40.0000 mg | Freq: Every day | INTRAVENOUS | Status: DC
  Administered 2023-10-10: 40 mg via INTRAVENOUS
  Filled 2023-10-10: qty 10

## 2023-10-10 MED ORDER — 0.9 % SODIUM CHLORIDE (POUR BTL) OPTIME
TOPICAL | Status: DC | PRN
  Administered 2023-10-10: 2000 mL

## 2023-10-10 MED ORDER — SUCCINYLCHOLINE CHLORIDE 200 MG/10ML IV SOSY
PREFILLED_SYRINGE | INTRAVENOUS | Status: AC
  Filled 2023-10-10: qty 10

## 2023-10-10 MED ORDER — SODIUM CHLORIDE 0.9 % IV SOLN
1.0000 mg | Freq: Every day | INTRAVENOUS | Status: DC
  Filled 2023-10-10 (×2): qty 0.2

## 2023-10-10 MED ORDER — PROPOFOL 10 MG/ML IV BOLUS
INTRAVENOUS | Status: AC
  Filled 2023-10-10: qty 20

## 2023-10-10 MED ORDER — ONDANSETRON HCL 4 MG/2ML IJ SOLN
INTRAMUSCULAR | Status: DC | PRN
  Administered 2023-10-10: 4 mg via INTRAVENOUS

## 2023-10-10 MED ORDER — LIDOCAINE 2% (20 MG/ML) 5 ML SYRINGE
INTRAMUSCULAR | Status: AC
  Filled 2023-10-10: qty 5

## 2023-10-10 MED ORDER — NOREPINEPHRINE BITARTRATE 1 MG/ML IV SOLN
INTRAVENOUS | Status: DC | PRN
  Administered 2023-10-10: .5 mL via INTRAVENOUS
  Administered 2023-10-10 (×4): 1 mL via INTRAVENOUS

## 2023-10-10 MED ORDER — ROCURONIUM BROMIDE 10 MG/ML (PF) SYRINGE
PREFILLED_SYRINGE | INTRAVENOUS | Status: DC | PRN
  Administered 2023-10-10: 70 mg via INTRAVENOUS

## 2023-10-10 MED ORDER — FAMOTIDINE 20 MG PO TABS: 20.0000 mg | ORAL_TABLET | Freq: Two times a day (BID) | ORAL | Status: DC

## 2023-10-10 MED ORDER — SODIUM CHLORIDE (PF) 0.9 % IJ SOLN
INTRAMUSCULAR | Status: AC
Start: 2023-10-10 — End: ?
  Filled 2023-10-10: qty 10

## 2023-10-10 MED ORDER — ALBUMIN HUMAN 5 % IV SOLN: INTRAVENOUS | Status: DC | PRN

## 2023-10-10 MED ORDER — LACTATED RINGERS IV SOLN: INTRAVENOUS | Status: DC

## 2023-10-10 MED ORDER — MIDAZOLAM HCL 2 MG/2ML IJ SOLN
INTRAMUSCULAR | Status: DC | PRN
  Administered 2023-10-10: 2 mg via INTRAVENOUS

## 2023-10-10 MED ORDER — DEXAMETHASONE SODIUM PHOSPHATE 10 MG/ML IJ SOLN
INTRAMUSCULAR | Status: AC
  Filled 2023-10-10: qty 1

## 2023-10-10 MED ORDER — FENTANYL CITRATE (PF) 250 MCG/5ML IJ SOLN
INTRAMUSCULAR | Status: DC | PRN
  Administered 2023-10-10 (×5): 50 ug via INTRAVENOUS

## 2023-10-10 MED ORDER — CHLORHEXIDINE GLUCONATE 0.12 % MT SOLN
OROMUCOSAL | Status: AC
  Administered 2023-10-10: 15 mL via OROMUCOSAL
  Filled 2023-10-10: qty 15

## 2023-10-10 MED ORDER — FENTANYL BOLUS VIA INFUSION: 25.0000 ug | INTRAVENOUS | Status: DC | PRN

## 2023-10-10 MED ORDER — MIDAZOLAM HCL 2 MG/2ML IJ SOLN
INTRAMUSCULAR | Status: AC
  Filled 2023-10-10: qty 2

## 2023-10-10 MED ORDER — ORAL CARE MOUTH RINSE: 15.0000 mL | Freq: Once | OROMUCOSAL | Status: AC

## 2023-10-10 MED ORDER — CHLORHEXIDINE GLUCONATE 0.12 % MT SOLN
15.0000 mL | Freq: Once | OROMUCOSAL | Status: AC
  Filled 2023-10-10: qty 15

## 2023-10-10 MED ORDER — POTASSIUM CHLORIDE CRYS ER 20 MEQ PO TBCR
40.0000 meq | EXTENDED_RELEASE_TABLET | Freq: Once | ORAL | Status: AC
  Administered 2023-10-10: 40 meq via ORAL
  Filled 2023-10-10: qty 2

## 2023-10-10 MED ORDER — ACETAMINOPHEN 10 MG/ML IV SOLN
1000.0000 mg | Freq: Four times a day (QID) | INTRAVENOUS | Status: DC
  Administered 2023-10-10 – 2023-10-11 (×3): 1000 mg via INTRAVENOUS
  Filled 2023-10-10 (×5): qty 100

## 2023-10-10 MED ORDER — FENTANYL CITRATE PF 50 MCG/ML IJ SOSY: 25.0000 ug | PREFILLED_SYRINGE | Freq: Once | INTRAMUSCULAR | Status: DC

## 2023-10-10 MED ORDER — PROPOFOL 1000 MG/100ML IV EMUL
INTRAVENOUS | Status: AC
Start: 2023-10-10 — End: ?
  Filled 2023-10-10: qty 100

## 2023-10-10 SURGICAL SUPPLY — 40 items
BAG COUNTER SPONGE SURGICOUNT (BAG) ×2 IMPLANT
BLADE CLIPPER SURG (BLADE) IMPLANT
BNDG GAUZE DERMACEA FLUFF 4 (GAUZE/BANDAGES/DRESSINGS) IMPLANT
CANISTER SUCTION 3000ML PPV (SUCTIONS) ×2 IMPLANT
CHLORAPREP W/TINT 26 (MISCELLANEOUS) ×2 IMPLANT
COVER SURGICAL LIGHT HANDLE (MISCELLANEOUS) ×2 IMPLANT
DRAPE LAPAROSCOPIC ABDOMINAL (DRAPES) ×2 IMPLANT
DRAPE WARM FLUID 44X44 (DRAPES) ×2 IMPLANT
ELECT BLADE 6.5 EXT (BLADE) IMPLANT
ELECTRODE REM PT RTRN 9FT ADLT (ELECTROSURGICAL) ×2 IMPLANT
GAUZE PAD ABD 8X10 STRL (GAUZE/BANDAGES/DRESSINGS) IMPLANT
GLOVE BIO SURGEON STRL SZ7.5 (GLOVE) ×2 IMPLANT
GLOVE INDICATOR 8.0 STRL GRN (GLOVE) ×2 IMPLANT
GOWN STRL REUS W/ TWL LRG LVL3 (GOWN DISPOSABLE) ×2 IMPLANT
GOWN STRL REUS W/ TWL XL LVL3 (GOWN DISPOSABLE) ×2 IMPLANT
HANDLE SUCTION POOLE (INSTRUMENTS) ×2 IMPLANT
KIT BASIN OR (CUSTOM PROCEDURE TRAY) ×2 IMPLANT
KIT OSTOMY DRAINABLE 2.75 STR (WOUND CARE) IMPLANT
KIT TURNOVER KIT B (KITS) ×2 IMPLANT
LIGASURE IMPACT 36 18CM CVD LR (INSTRUMENTS) IMPLANT
NS IRRIG 1000ML POUR BTL (IV SOLUTION) ×4 IMPLANT
PACK GENERAL/GYN (CUSTOM PROCEDURE TRAY) ×2 IMPLANT
PAD ARMBOARD POSITIONER FOAM (MISCELLANEOUS) ×2 IMPLANT
PENCIL SMOKE EVACUATOR (MISCELLANEOUS) ×2 IMPLANT
RELOAD GRN CONTOUR (ENDOMECHANICALS) ×1 IMPLANT
RELOAD STAPLE 40 GRN THCK (ENDOMECHANICALS) IMPLANT
SPECIMEN JAR LARGE (MISCELLANEOUS) IMPLANT
SPONGE T-LAP 18X18 ~~LOC~~+RFID (SPONGE) IMPLANT
STAPLER CVD CUT GN 40 RELOAD (ENDOMECHANICALS) ×1 IMPLANT
STAPLER CVD CUT GRN 40 RELOAD (ENDOMECHANICALS) IMPLANT
STAPLER SKIN PROX 35W (STAPLE) ×2 IMPLANT
SUT PDS AB 1 TP1 96 (SUTURE) ×4 IMPLANT
SUT SILK 2 0 SH CR/8 (SUTURE) ×2 IMPLANT
SUT SILK 2 0 TIES 10X30 (SUTURE) ×2 IMPLANT
SUT SILK 3 0 SH CR/8 (SUTURE) ×2 IMPLANT
SUT SILK 3 0 TIES 10X30 (SUTURE) ×2 IMPLANT
TAPE CLOTH SURG 6X10 NS LF (GAUZE/BANDAGES/DRESSINGS) IMPLANT
TOWEL GREEN STERILE (TOWEL DISPOSABLE) ×2 IMPLANT
TRAY FOLEY MTR SLVR 16FR STAT (SET/KITS/TRAYS/PACK) ×2 IMPLANT
YANKAUER SUCT BULB TIP NO VENT (SUCTIONS) IMPLANT

## 2023-10-10 NOTE — Op Note (Signed)
 10/08/2023 - 10/10/2023  1:14 PM  PATIENT:  Vicki [name]  58 y.o. female  Patient Care Team: Vicki Mis, FNP as PCP - General (Family Medicine) Vicki Sharper, MD as Consulting Physician ([facility] Vicki [name]  PRE-OPERATIVE DIAGNOSIS:  Perforated diverticulitis  POST-OPERATIVE DIAGNOSIS:  Perforated diverticulitis with purulent peritonitis  PROCEDURE:  Exploratory laparotomy with sigmoid colectomy, end colostomy (Hartmann's procedure)  SURGEON:  Vicki [name]. Vicki Masterson, MD  ASSISTANT: OR staff  ANESTHESIA:   general  COUNTS:  Sponge, needle and instrument counts were reported correct x2 at the conclusion of the operation.  EBL: 300 mL  DRAINS: none  SPECIMEN: rectosigmoid colon - suture distal staple line  COMPLICATIONS: none  FINDINGS: Purulent peritonitis with sigmoid diverticulitis. Hartmann's procedure carried out otherwise uneventfully  DISPOSITION: ICU in critical but stable condition  DESCRIPTION: The patient was identified in preop holding and taken to the OR where she was placed on the operating room table. SCDs were placed.  Hair on the abdomen was clipped.  General endotracheal anesthesia was induced without difficulty.  She was then prepped and draped in the usual sterile fashion. A surgical timeout was performed indicating the correct patient, procedure, positioning and need for preoperative antibiotics.   A low midline incision was created.  Subcutaneous tissue is divided electrocautery.  Fascia was incised in the midline.  The peritoneal cavity is entered.  Copious amounts of purulent ascites is emanating from the abdomen.  This is evacuated with the pull tip suction device.  We evacuated approximately 500 cc of purulent ascites.  Upon entering the abdomen (organ space), I encountered purulent ascites.  CASE DATA:  Type of patient?: DOW CASE (Surgical Hospitalist Vicki [name] Inpatient)  Status of Case? EMERGENT Add  On  Infection Present At Time Of Surgery (PATOS)?  PURULENCE at the entire abdomen  The wound is opened.  A Balfour retractor is placed.  The omentum was reflected cephalad.  The small bowel was retracted on the left lower quadrant.  She was positioned in Trendelenburg.  The sigmoid colon is readily identified and in the mid to distal portion of the sigmoid there is a dense inflammatory reaction with fibrosis.  There is phlegmonous changes at this level with purulence.  This is consistent with the site of perforation presumably secondary to diverticulitis.  The peritoneum overlying the mesentery of the sigmoid colon is scored.  We went up proximally to the beginning of the sigmoid colon and created a mesenteric window at this location.  The colon is then divided using a contour green load stapler.  We then mobilized the sigmoid colon fully.  We identified a point of distal transection which is at the level of the rectosigmoid junction where the Vicki [name] has splayed and overlies the sacral promontory.  Anatomically, this is felt to represent the proximalmost aspect of the rectum.  A wound is created mesentery at this location and the rectosigmoid colon is divided using a green load contour stapler.  The distal staple line of the specimen is tagged with a suture.  The intervening mesentery is then ligated and divided hugging the colon and staying well away from the retroperitoneum and left ureter.  The specimen is then passed off.  The abdomen is irrigated.  Hemostasis is verified.  There was 1 area of oozing on the sigmoid mesentery that was controlled with a 3-0 silk figure-of-eight suture.  The pelvis is irrigated.  Hemostasis verified.  The rectosigmoid stump was tagged with a single Prolene suture.  The abdomen is then reinspected.  It is copiously irrigated with sterile saline till the effluent was clean.  The omentum was brought down over the midline.  We turned our attention to the descending colon.  The  planned colostomy site is evaluated.  This is in the left lower quadrant.  A wheal of skin is excised this location.  The subcutaneous tissue divided electrocautery.  The fascia is incised longitudinally over the rectus muscle.  The rectus muscles and spread.  The knee was carefully entered.  This is dilated up to about 2 fingerbreadths in diameter.  The descending colon was then brought out through this.  We did mobilize additional descending colon by incising the Vicki [name] line of Toldt to facilitate reach.  This reaches nicely.  There is a palpable pulse in the mesentery going up to the planned colostomy.  The Babcock clamp was placed on this.  Attention was then turned to closing.  With the omentum down across the midline covering the small bowel, the midline fascia was closed using 2 running #1 PDS sutures.  The wound is irrigated.  A dressing consisting of Kerlix and 4 x 4's is placed.  The skin was intentionally left open given the contamination of this procedure.  The colostomy was then matured using 3-0 Vicryl sutures.  The colostomy is pink in color.  It is widely patent down to and below the level of the fascia.  All sponge, needle, and instrument counts are reported correct.  The case was turned back over anesthesia.  The decision was made for her to remain intubated given her tenuous respiratory status.  She was subsequently transported to the intensive care unit in critical but stable condition.

## 2023-10-10 NOTE — Anesthesia Procedure Notes (Signed)
 Procedure Name: Intubation Date/Time: 10/10/2023 11:56 AM  Performed by: Alys Julian, CRNAPre-anesthesia Checklist: Patient identified, Emergency Drugs available, Suction available and Patient being monitored Patient Re-evaluated:Patient Re-evaluated prior to induction Oxygen Delivery Method: Circle system utilized Preoxygenation: Pre-oxygenation with 100% oxygen Induction Type: IV induction, Cricoid Pressure applied and Rapid sequence Laryngoscope Size: Miller and 2 Grade View: Grade I Tube type: Oral Tube size: 7.0 mm Number of attempts: 1 Airway Equipment and Method: Stylet and Bite block Placement Confirmation: ETT inserted through vocal cords under direct vision, positive ETCO2 and breath sounds checked- equal and bilateral Secured at: 21 cm Tube secured with: Tape Dental Injury: Teeth and Oropharynx as per pre-operative assessment

## 2023-10-10 NOTE — Progress Notes (Signed)
 [facility] ADULT ICU REPLACEMENT PROTOCOL   The patient does apply for the [facility] Adult ICU Electrolyte Replacment Protocol based on the criteria listed below:   1.Exclusion criteria: TCTS, ECMO, Dialysis, and Myasthenia Gravis patients 2. Is GFR >/= 30 ml/min? Yes.    Patient's GFR today is >60 3. Is SCr </= 2? Yes.   Patient's SCr is 0.79 mg/dL 4. Did SCr increase >/= 0.5 in 24 hours? No. 5.Pt's weight >40kg  Yes.   6. Abnormal electrolyte(s): potassium 3.6  7. Electrolytes replaced per protocol 8.  Call MD STAT for K+ </= 2.5, Phos </= 1, or Mag </= 1 Physician:  protocol  Marva Sleight 10/10/2023 6:41 AM

## 2023-10-10 NOTE — Anesthesia Preprocedure Evaluation (Addendum)
 Anesthesia Evaluation  Patient identified by MRN, date of birth, ID band Patient awake    Reviewed: Allergy & Precautions, H&P , NPO status , Patient's Chart, lab work & pertinent test results  Airway Mallampati: I  TM Distance: >3 FB Neck ROM: Full    Dental  (+) Teeth Intact, Dental Advisory Given   Pulmonary asthma , pneumonia, resolved, COPD,  COPD inhaler and oxygen dependent, Current Smoker and Patient abstained from smoking.   breath sounds clear to auscultation       Cardiovascular hypertension, Pt. on medications Normal cardiovascular exam Rhythm:Regular Rate:Normal  EKG 10/08/23 Probable left atrial enlargement Borderline intraventricular conduction delay  Echo 10/07/19 1. Left ventricular ejection fraction, by estimation, is 60 to 65%. The  left ventricle has normal function. The left ventricle has no regional  wall motion abnormalities. Left ventricular diastolic parameters were  normal.   2. Right ventricular systolic function is normal. The right ventricular  size is mildly enlarged. Tricuspid regurgitation signal is inadequate for  assessing PA pressure.   3. The mitral valve is normal in structure. No evidence of mitral valve  regurgitation.   4. The aortic valve was not well visualized. Aortic valve regurgitation  is not visualized. No aortic stenosis is present.   5. The inferior vena cava is normal in size with greater than 50%  respiratory variability, suggesting right atrial pressure of 3 mmHg.   Exercise stress test 08/2019 Low risk study  Septic shock on Levo    Neuro/Psych  Headaches PSYCHIATRIC DISORDERS Anxiety Depression     Neuromuscular disease    GI/Hepatic ,GERD  Medicated,,Diverticulitis C. Diff +   Endo/Other  Obesity  Renal/GU Renal InsufficiencyRenal disease     Musculoskeletal  (+) Arthritis , Osteoarthritis,    Abdominal  (+) + obese  Peds  Hematology negative hematology  ROS (+)   Anesthesia Other Findings   Reproductive/Obstetrics                             Anesthesia Physical Anesthesia Plan  ASA: III  Anesthesia Plan: General   Post-op Pain Management:    Induction: Intravenous  PONV Risk Score and Plan: 2 and Treatment may vary due to age or medical condition, Ondansetron  and Midazolam   Airway Management Planned: Oral ETT  Additional Equipment: Arterial line  Intra-op Plan:   Post-operative Plan: Post-operative intubation/ventilation  Informed Consent: I have reviewed the patients History and Physical, chart, labs and discussed the procedure including the risks, benefits and alternatives for the proposed anesthesia with the patient or authorized representative who has indicated his/her understanding and acceptance.     Dental advisory given  Plan Discussed with: CRNA and Anesthesiologist  Anesthesia Plan Comments:         Anesthesia Quick Evaluation

## 2023-10-10 NOTE — Progress Notes (Signed)
 Progress Note     Subjective: Pt transferred to the ICU yesterday evening secondary to hypotension, started on levophed. She reports abdominal pain is unchanged from yesterday. Having significant nausea and vomiting. BP remains soft, intermittently tachycardic. O2 requirements stable currently. Lactic worsened overnight and significant worsening in leukocytosis.   Objective: Vital signs in last 24 hours: Temp:  [98.1 F (36.7 C)-100.4 F (38 C)] 98.8 F (37.1 C) (05/29 0715) Pulse Rate:  [84-139] 87 (05/29 0715) Resp:  [13-31] 22 (05/29 0715) BP: (71-143)/(50-94) 100/64 (05/29 0715) SpO2:  [88 %-97 %] 96 % (05/29 0715) Weight:  [78.7 kg] 78.7 kg (05/28 1820) Last BM Date : 10/09/23  Intake/Output from previous day: 05/28 0701 - 05/29 0700 In: 4356.9 [I.V.:1585.9; IV Piggyback:2771.1] Out: 340 [Urine:340] Intake/Output this shift: No intake/output data recorded.  PE: General: WD, obese female, ill appearing  HEENT: head is normocephalic, atraumatic.  Sclera anicteric.  Ears and nose without any masses or lesions.  Mouth is pink and moist Heart: sinus tachycardia in low 100s Lungs: on 4L via [place] which is baseline, no audible wheezing, no tachypnea  Abd: distended, diffusely ttp with some guarding and peritonitis MS: all 4 extremities are symmetrical with no cyanosis, clubbing, or edema. Skin: warm and dry with no masses, lesions, or rashes Neuro: non-focal exam  Psych: A&Ox3 with an appropriate affect.    Lab Results:  Recent Labs    10/09/23 1907 10/10/23 0402  WBC 4.5 17.1*  HGB 10.0* 9.9*  HCT 30.8* 30.1*  PLT 129* 200   BMET Recent Labs    10/09/23 1907 10/10/23 0402  NA 135 133*  K 3.3* 3.6  CL 100 101  CO2 16* 23  GLUCOSE 94 108*  BUN 5* <5*  CREATININE 0.83 0.79  CALCIUM  7.4* 7.5*   PT/INR Recent Labs    10/08/23 1633  LABPROT 13.6  INR 1.0   CMP     Component Value Date/Time   NA 133 (L) 10/10/2023 0402   NA 140 10/07/2020 0925   K  3.6 10/10/2023 0402   CL 101 10/10/2023 0402   CO2 23 10/10/2023 0402   GLUCOSE 108 (H) 10/10/2023 0402   BUN <5 (L) 10/10/2023 0402   BUN 12 10/07/2020 0925   CREATININE 0.79 10/10/2023 0402   CREATININE 0.76 04/25/2023 1136   CALCIUM  7.5 (L) 10/10/2023 0402   PROT 5.0 (L) 10/10/2023 0402   PROT 7.0 10/07/2020 0925   ALBUMIN 2.5 (L) 10/10/2023 0402   ALBUMIN 4.3 10/07/2020 0925   AST 34 10/10/2023 0402   ALT 12 10/10/2023 0402   ALKPHOS 55 10/10/2023 0402   BILITOT 1.2 10/10/2023 0402   BILITOT 0.2 10/07/2020 0925   GFRNONAA >60 10/10/2023 0402   GFRAA 81 08/17/2019 1238   Lipase     Component Value Date/Time   LIPASE 30 12/22/2022 1903       Studies/Results: CT ABDOMEN PELVIS W CONTRAST Result Date: 10/08/2023 CLINICAL DATA:  Left lower quadrant pain. EXAM: CT ABDOMEN AND PELVIS WITH CONTRAST TECHNIQUE: Multidetector CT imaging of the abdomen and pelvis was performed using the standard protocol following bolus administration of intravenous contrast. RADIATION DOSE REDUCTION: This exam was performed according to the departmental dose-optimization program which includes automated exposure control, adjustment of the mA and/or kV according to patient size and/or use of iterative reconstruction technique. CONTRAST:  75mL OMNIPAQUE  IOHEXOL  350 MG/ML SOLN COMPARISON:  December 22, 2022 FINDINGS: Lower chest: Mild areas of lingular, right middle lobe and bibasilar linear scarring  and/or atelectasis are seen. Hepatobiliary: There is diffuse fatty infiltration of the liver parenchyma. A 1.7 cm x 2.1 cm x 3.0 cm isodense liver lesion is seen within segment IV of the right lobe of the liver (approximately 45.15 Hounsfield units). The parenchyma throughout the remainder of the liver measures approximately 54.93 Hounsfield units. This area is surrounded by a very thin hyperdense rim and is not clearly visualized on the prior study. Status post cholecystectomy. No biliary dilatation. Pancreas:  Unremarkable. No pancreatic ductal dilatation or surrounding inflammatory changes. Spleen: Normal in size without focal abnormality. Adrenals/Urinary Tract: Adrenal glands are unremarkable. Kidneys are normal, without renal calculi, focal lesion, or hydronephrosis. Bladder is unremarkable. Stomach/Bowel: Stomach is within normal limits. The appendix is surgically absent. No evidence of bowel dilatation. Numerous diverticula are seen throughout the descending and sigmoid colon. The mid sigmoid colon is also markedly thickened and inflamed. Mildly inflamed loops of adjacent distal ileum are seen. There is no evidence of associated perforation or abscess. Vascular/Lymphatic: Aortic atherosclerosis. No enlarged abdominal or pelvic lymph nodes. Reproductive: Status post hysterectomy. No adnexal masses. Other: No abdominal wall hernia or abnormality. There is a small amount of pelvic free fluid. Musculoskeletal: Degenerative changes are seen within the lower lumbar spine. IMPRESSION: 1. Marked severity sigmoid diverticulitis without associated perforation or abscess. 2. Mildly inflamed loops of adjacent distal ileum. 3. Hepatic steatosis. 4. Isodense liver lesion, as described above, which represents a new finding when compared to the prior study. Nonemergent MRI follow-up is recommended to further exclude the presence of an underlying neoplastic process. 5. Evidence of prior cholecystectomy, appendectomy and hysterectomy. 6. Aortic atherosclerosis. Electronically Signed   By: Virgle Grime M.D.   On: 10/08/2023 20:45   DG Chest Port 1 View Result Date: 10/08/2023 CLINICAL DATA:  Questionable sepsis - evaluate for abnormality Abdominal and back pain. EXAM: PORTABLE CHEST 1 VIEW COMPARISON:  Chest CT 07/11/2022 FINDINGS: Chronic hyperinflation and bronchial thickening. No focal airspace disease. The heart is normal in size. No pulmonary edema, pleural effusion, or pneumothorax. No acute osseous findings IMPRESSION:  Chronic hyperinflation and bronchial thickening. No acute findings. Electronically Signed   By: Chadwick Colonel M.D.   On: 10/08/2023 18:22    Anti-infectives: Anti-infectives (From admission, onward)    Start     Dose/Rate Route Frequency Ordered Stop   10/09/23 0800  metroNIDAZOLE  (FLAGYL ) IVPB 500 mg        500 mg 100 mL/hr over 60 Minutes Intravenous Every 12 hours 10/08/23 2220     10/09/23 0000  cefTRIAXone  (ROCEPHIN ) 2 g in sodium chloride  0.9 % 100 mL IVPB        2 g 200 mL/hr over 30 Minutes Intravenous Every 24 hours 10/08/23 2220     10/08/23 1730  aztreonam (AZACTAM) 2 g in sodium chloride  0.9 % 100 mL IVPB        2 g 200 mL/hr over 30 Minutes Intravenous  Once 10/08/23 1724 10/08/23 1812   10/08/23 1730  metroNIDAZOLE  (FLAGYL ) IVPB 500 mg        500 mg 100 mL/hr over 60 Minutes Intravenous  Once 10/08/23 1724 10/08/23 1854   10/08/23 1730  vancomycin  (VANCOCIN ) IVPB 1000 mg/200 mL premix        1,000 mg 200 mL/hr over 60 Minutes Intravenous  Once 10/08/23 1724 10/08/23 1954        Assessment/Plan  Septic shock secondary to  Sigmoid Diverticulitis  - CT without perforation or abscess but marked severity sigmoid diverticulitis  -  pt this AM on levo with significant ttp and peritonitis  - WBC 17 from 11 on admission and lactic was going back up overnight - at this point seems to be failing conservative management with bowel rest and antibiotics  - would recommend proceeding to OR for ex-lap with partial colectomy and colostomy. Discussed risks of general anesthesia, infection, bleeding, need for prolonged intubation post-operatively, possible open abdomen with return to OR, possible post-operative abscess and wound issues. I have discussed this with the patient and she agrees to proceed. 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  This care required high  level of medical decision making.    Annetta Killian, [facility] Surgery 10/10/2023, 8:10 AM Please see Amion for pager number during day hours 7:00am-4:30pm

## 2023-10-10 NOTE — Progress Notes (Signed)
 Pt returned from PACU now.

## 2023-10-10 NOTE — Consult Note (Signed)
 NAME:  Vicki [name], MRN:  [id number], DOB:  1965/12/09, LOS: 2 ADMISSION DATE:  10/08/2023, CONSULTATION DATE:  10/09/2023 REFERRING MD:  Gillermo Lack, [facility] CHIEF COMPLAINT:  septic shock   History of Present Illness:  58 y/o female with PMH for COPD/Emphysema on 4-5 liters home O2 but still smoking (follows [facility]), Depression, esophagitis, recurrent Diverticulitis and recurrent C diff (follows with GI) who presented with abdominal pain x 1 week and also several days of n/v and 1 week of diarrhea. She says she initially thought hse had a UTI and trying to treat herself at home and with OTC meds but it did not get better and in the last 3 days has worsened. Therefore she came to the hospital. She denies blood in her stool. Her LA was 2.9 in ED and then repat was 8.7. She c/o generalized abdominal pain.   Pertinent  Medical History  Anxiety, Arthritis, Asthma, CAP (community acquired pneumonia) (04/07/2019), Carpal tunnel syndrome, Cataract, COPD (chronic obstructive pulmonary disease) (HCC), Depression, Diverticulitis, Emphysema of lung (HCC), Essential hypertension, benign (07/14/2019), Hyperlipidemia, Oxygen deficiency, and Oxygen dependent.   Significant Hospital Events: Including procedures, antibiotic start and stop dates in addition to other pertinent events   5/27: initially evaluated by PCCM, deferred ICU admission as BP stable, recommending continued IVF, antibiotics 5/28: re-consult for hypotension after IVF resuscitation, albumin   Interim History / Subjective:  Remains on norepinephrine 7 Continues to have nausea, abdominal discomfort I/O+ 4 L total WBC 17.1, LFT reassuring  Objective    Blood pressure 91/70, pulse (!) 106, temperature 98.8 F (37.1 C), temperature source Oral, resp. rate 19, weight 78.7 kg, SpO2 95%.        Intake/Output Summary (Last 24 hours) at 10/10/2023 1000 Last data filed at 10/10/2023 0700 Gross per 24 hour  Intake 4356.93 ml  Output  140 ml  Net 4216.93 ml   Filed Weights   10/09/23 1820  Weight: 78.7 kg    Examination: General: Ill-appearing woman, sitting up in bed HENT: Oropharynx clear, dry.  Strong voice, no secretions Lungs: Decreased to both bases but clear bilaterally.  Nasal cannula O2 in place Cardiovascular: Regular, distant, no murmur Abdomen: Tender to palpation bilateral lower quadrants, no rigidity, no rebound, hypoactive bowel sounds Extremities: No edema Neuro: Awake and alert, oriented, follows commands  Resolved Hospital Problem list    Assessment & Plan:  Septic shock secondary to sigmoid diverticulitis Lactic acidosis  History of recurrent diverticulitis and recurrent C diff infections   Lactic 2.9> 8.7> 2> 1.6 -Continue ceftriaxone , metronidazole  - Wean norepinephrine as able, volume resuscitation - Appreciate surgery management.  Discussed with them this morning 5/29.  Plan for her to go to the operating room for resection today 5/29.  She may come out of the OR hemodynamically stable, intubated. - Pain control with Dilaudid  - Nausea control with Zofran , Compazine  - Has been receiving oral midodrine, on hold in preparation for OR - PPI twice daily  Hypomagnesemia, improved Hypokalemia, improved -Follow BMP closely - Replete electrolytes as indicated  Alcohol use disorder Alcohol withdrawal Drinks slightly less than a pint of vodka every few days -CIWA protocol.  No evidence withdrawal at this time - Continue thiamine , folic acid , multivitamin -Seizure precautions -Counsel on cessation  COPD on 4-5L home O2  Asthma  - O2 nasal cannula for sat goal 90% -DuoNeb as scheduled -Continue Breztri -Albuterol  as needed  Hypertension Hyperlipidemia -Her home antihypertensive regimen is on hold - Restart statin when feasible for her  to take p.o.  Depression  Insomnia  -Hold Celexa  and trazodone  until she is able to take p.o. -Hold Requip    Best Practice (right click and  "Reselect all SmartList Selections" daily)   Diet/type: NPO DVT prophylaxis: LMWH GI prophylaxis: PPI Lines: N/A Foley:  N/A Code Status:  full code Last date of multidisciplinary goals of care discussion [update husband and pt on plan of care at transfer]  Labs   CBC: Recent Labs  Lab 10/08/23 1633 10/09/23 0442 10/09/23 1907 10/10/23 0402  WBC 11.3* 9.8 4.5 17.1*  NEUTROABS 9.4*  --  3.8 15.7*  HGB 13.0 10.4* 10.0* 9.9*  HCT 39.6 32.1* 30.8* 30.1*  MCV 95.2 96.4 97.8 96.8  PLT 213 161 129* 200    Basic Metabolic Panel: Recent Labs  Lab 10/08/23 1633 10/09/23 0442 10/09/23 1907 10/09/23 2108 10/10/23 0402  NA 138 137 135  --  133*  K 3.2* 2.9* 3.3*  --  3.6  CL 95* 99 100  --  101  CO2 31 32 16*  --  23  GLUCOSE 133* 108* 94  --  108*  BUN <5* <5* 5*  --  <5*  CREATININE 0.70 0.74 0.83  --  0.79  CALCIUM  7.9* 7.8* 7.4*  --  7.5*  MG  --  0.7*  --  2.5*  --   PHOS  --  2.2* 2.6  --  2.5   GFR: Estimated Creatinine Clearance: 78.9 mL/min (by C-G formula based on SCr of 0.79 mg/dL). Recent Labs  Lab 10/08/23 1633 10/08/23 1706 10/09/23 0030 10/09/23 0438 10/09/23 0442 10/09/23 1907 10/09/23 2108 10/10/23 0019 10/10/23 0402  PROCALCITON  --   --   --   --   --   --  3.64  --   --   WBC 11.3*  --   --   --  9.8 4.5  --   --  17.1*  LATICACIDVEN  --    < > 2.0* 1.6  --   --  5.2* 4.5*  --    < > = values in this interval not displayed.    Liver Function Tests: Recent Labs  Lab 10/08/23 1633 10/09/23 0442 10/09/23 1907 10/10/23 0402  AST 61* 45* 41 34  ALT 16 13 11 12   ALKPHOS 121 78 74 55  BILITOT 1.2 1.0 1.4* 1.2  PROT 6.9 5.2* 4.9* 5.0*  ALBUMIN 3.4* 2.4* 2.5* 2.5*   No results for input(s): "LIPASE", "AMYLASE" in the last 168 hours. No results for input(s): "AMMONIA" in the last 168 hours.  ABG    Component Value Date/Time   HCO3 32.4 (H) 04/05/2022 1353   TCO2 34 (H) 04/05/2022 1353   O2SAT 90 04/05/2022 1353     Coagulation  Profile: Recent Labs  Lab 10/08/23 1633  INR 1.0    Cardiac Enzymes: No results for input(s): "CKTOTAL", "CKMB", "CKMBINDEX", "TROPONINI" in the last 168 hours.  HbA1C: Hgb A1c MFr Bld  Date/Time Value Ref Range Status  04/25/2023 11:36 AM 5.3 <5.7 % of total Hgb Final    Comment:    For the purpose of screening for the presence of diabetes: . <5.7%       Consistent with the absence of diabetes 5.7-6.4%    Consistent with increased risk for diabetes             (prediabetes) > or =6.5%  Consistent with diabetes . This assay result is consistent with a decreased risk of diabetes. . Currently,  no consensus exists regarding use of hemoglobin A1c for diagnosis of diabetes in children. . According to American Diabetes Association (ADA) guidelines, hemoglobin A1c <7.0% represents optimal control in non-pregnant diabetic patients. Different metrics may apply to specific patient populations.  Standards of Medical Care in Diabetes(ADA). Aaron Aas   07/10/2021 01:20 PM 5.4 4.6 - 6.5 % Final    Comment:    Glycemic Control Guidelines for People with Diabetes:Non Diabetic:  <6%Goal of Therapy: <7%Additional Action Suggested:  >8%     CBG: Recent Labs  Lab 10/09/23 1818 10/09/23 1956 10/09/23 2340 10/10/23 0350 10/10/23 0724  GLUCAP 94 100* 107* 118* 103*     Critical care time: 34 minutes    Racheal Buddle, MD, PhD 10/10/2023, 10:00 AM [facility] [phone] or if no answer before 7:00PM call [phone] For any issues after 7:00PM please call eLink [phone]

## 2023-10-10 NOTE — Anesthesia Procedure Notes (Signed)
 Arterial Line Insertion Start/End5/29/2025 11:45 AM, 10/10/2023 11:50 AM Performed by: Alphia Jasmine, CRNA, CRNA  Patient location: Pre-op. Preanesthetic checklist: patient identified, IV checked, site marked, risks and benefits discussed, surgical consent, monitors and equipment checked, pre-op evaluation, timeout performed and anesthesia consent Lidocaine  1% used for infiltration Left, radial was placed Catheter size: 20 G Hand hygiene performed  and maximum sterile barriers used  Allen's test indicative of satisfactory collateral circulation Attempts: 2 Procedure performed using ultrasound guided technique. Ultrasound Notes:no ultrasound evidence of intravascular and/or intraneural injection Following insertion, dressing applied and Biopatch. Post procedure assessment: normal and unchanged  Patient tolerated the procedure well with no immediate complications. Additional procedure comments: 1st attempt by SRNA.

## 2023-10-10 NOTE — Procedures (Signed)
 Extubation Procedure Note  Patient Details:   Name: Vicki [name] DOB: [birth year] MRN: [id number]   Airway Documentation:    Vent end date: 10/10/23 Vent end time: 1840   Evaluation  O2 sats: stable throughout Complications: No apparent complications Patient did tolerate procedure well. Bilateral Breath Sounds: Clear, Diminished   Yes  Pt extubated per order to 4L [place]. Pt had a positive cuff leak, able to state name, good strong cough and no stridor noted.   Parley Bolls 10/10/2023, 6:40 PM

## 2023-10-10 NOTE — Transfer of Care (Signed)
 Immediate Anesthesia Transfer of Care Note  Patient: Vicki [name]  Procedure(s) Performed: EXPLORATORY LAPAROTOMY, PARTIAL COLECTOMY AND COLOSTOMY  Patient Location: ICU  Anesthesia Type:General  Level of Consciousness: sedated and Patient remains intubated per anesthesia plan  Airway & Oxygen Therapy: Patient remains intubated per anesthesia plan and Patient placed on Ventilator (see vital sign flow sheet for setting)  Post-op Assessment: Report given to RN and Post -op Vital signs reviewed and stable  Post vital signs: Reviewed and stable  Last Vitals:  Vitals Value Taken Time  BP 118/71   Temp 36.7   Pulse 124   Resp 27   SpO2 100     Last Pain:  Vitals:   10/10/23 1056  TempSrc:   PainSc: 9       Patients Stated Pain Goal: 1 (10/10/23 0900)  Complications: No notable events documented.

## 2023-10-10 NOTE — Progress Notes (Signed)
 Called to room. Pt intermittently reaching for tube. Nursing reports either having to give sedating drugs resulting in decreased BP or gets hypertensive and reaches for tubes  BP (!) 84/64   Pulse 96   Temp (!) 97.3 F (36.3 C) (Oral)   Resp (!) 8   Ht 5\' 3"  (1.6 m)   Wt 78.7 kg   SpO2 96%   BMI 30.73 kg/m   acetaminophen      cefTRIAXone  (ROCEPHIN )  IV 200 mL/hr at 10/10/23 0017   fentaNYL  infusion INTRAVENOUS 50 mcg/hr (10/10/23 1429)   folic acid  1 mg in sodium chloride  0.9 % 50 mL IVPB     metronidazole  Stopped (10/10/23 0936)   norepinephrine  (LEVOPHED ) Adult infusion 10 mcg/min (10/10/23 1656)    General On my arrival the pt is resting in bed. She is on 9mcg/min of norepi but rapidly weaning as effects of the versed  taper off.  Hent NCAT no JVD orally intubated Pulm clear. Placed on PSV 5/peep 5 VTs in 600s-700s. Rr 10-12 no accessory use. Readily wakes up. Follows commands. Denies SOB Card rrr Abd soft mid abd dressing CD&I ostomy beefy bowel sounds quiet  Ext warm and dry no edema brisk CR Neuro awake follows commands no focal def    Latest Ref Rng & Units 10/10/2023    3:24 PM 10/10/2023    4:02 AM 10/09/2023    7:07 PM  CBC  WBC 4.0 - 10.5 K/uL  17.1  4.5   Hemoglobin 12.0 - 15.0 g/dL 16.1  9.9  09.6   Hematocrit 36.0 - 46.0 % 30.0  30.1  30.8   Platelets 150 - 400 K/uL  200  129    Impression   Chronic respiratory failure  Post op ventilator management  COPD w/ asthma overlap (not currently in exacerbation)  Sepsis 2/2 perforated diverticulitis and peritonitis now s/p expl lap, sig  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  H/o etoh w/ concern for wd (not evident currently)   Discussion Pt looks good F/Vt very favorable, pain under control, follows commands interacts appropriate. We either have to sedate her or extubate. Given her underling lung disease I worry the longer we keep her intubated the more difficult it may be to get her off vent. Her NE  needs are rapidly coming down as she wakes up further   Plan Extubate to [place] (3-4 lpm) IS Scheduled nebs Pulse ox  NPO Careful w/ narcs Early mobilization  Dc sedating gtts   My cct 43 min

## 2023-10-10 NOTE — TOC CM/SW Note (Signed)
 Transition of Care [facility]) - Inpatient Brief Assessment   Patient Details  Name: Denyla Cortese MRN: [id number] Date of Birth: 01-07-1966  [facility] CM/SW Contact:    Juliane Och, LCSW Phone Number: 10/10/2023, 3:15 PM   Clinical Narrative:  3:15 PM Per chart review, patient resides at home with spouse and child(ren). Patient has a PCP and insurance. Patient does not have SNF or HH history. Patient has DME history with [employer] and [employer] (home oxygen, nebulizer, walker, rolling walker). TOC consult for substance use education/counseling has been acknowledged. Patient is currently intubated and expected to be extubated tomorrow. TOC will continue to follow.  Transition of Care Asessment: Insurance and Status: Insurance coverage has been reviewed Patient has primary care physician: Yes Home environment has been reviewed: Private Residence Prior level of function:: N/A Prior/Current Home Services: No current home services (Has DME history) Social Drivers of Health Review: SDOH reviewed no interventions necessary (As of June 20th, 2024. SDOH needs updating.) Readmission risk has been reviewed: Yes Transition of care needs: transition of care needs identified, TOC will continue to follow

## 2023-10-10 NOTE — Anesthesia Postprocedure Evaluation (Signed)
 Anesthesia Post Note  Patient: Vicki [name]  Procedure(s) Performed: EXPLORATORY LAPAROTOMY, PARTIAL COLECTOMY AND COLOSTOMY     Patient location during evaluation: PACU Anesthesia Type: General Level of consciousness: patient remains intubated per anesthesia plan Pain management: pain level controlled Vital Signs Assessment: post-procedure vital signs reviewed and stable Respiratory status: spontaneous breathing, nonlabored ventilation, respiratory function stable, patient on ventilator - see flowsheet for VS and patient remains intubated per anesthesia plan Cardiovascular status: unstable and blood pressure returned to baseline Postop Assessment: no apparent nausea or vomiting Anesthetic complications: no Comments: Patient taken to ICU intubated on Levophed gtt.    No notable events documented.  Last Vitals:  Vitals:   10/10/23 1030 10/10/23 1338  BP: 125/87   Pulse: 94   Resp: 20   Temp:    SpO2: 95% 99%    Last Pain:  Vitals:   10/10/23 1056  TempSrc:   PainSc: 9                  Kaeleen Odom A.

## 2023-10-10 NOTE — Consult Note (Signed)
 WOC Nurse ostomy consult note Consulted for a new end colostomy.  Perforated diverticulitis. Exploratory laparotomy today.  WOC team added her to our following list to assist her about ostomy care.  Scheduled to do a consult tomorrow for her first pouch change pos-op.  WOC team will follow. Please reconsult if further assistance is needed. Thank-you,  Rachel Budds BSN, RN, [employer], WOC  (Pager: [phone])

## 2023-10-11 ENCOUNTER — Encounter (HOSPITAL_COMMUNITY): Payer: Self-pay | Admitting: Surgery

## 2023-10-11 DIAGNOSIS — E871 Hypo-osmolality and hyponatremia: Secondary | ICD-10-CM

## 2023-10-11 DIAGNOSIS — A419 Sepsis, unspecified organism: Secondary | ICD-10-CM | POA: Diagnosis not present

## 2023-10-11 DIAGNOSIS — K5721 Diverticulitis of large intestine with perforation and abscess with bleeding: Secondary | ICD-10-CM | POA: Diagnosis not present

## 2023-10-11 DIAGNOSIS — R6521 Severe sepsis with septic shock: Secondary | ICD-10-CM | POA: Diagnosis not present

## 2023-10-11 DIAGNOSIS — J9622 Acute and chronic respiratory failure with hypercapnia: Secondary | ICD-10-CM

## 2023-10-11 DIAGNOSIS — J9621 Acute and chronic respiratory failure with hypoxia: Secondary | ICD-10-CM

## 2023-10-11 LAB — CBC
HCT: 27.3 % — ABNORMAL LOW (ref 36.0–46.0)
Hemoglobin: 8.9 g/dL — ABNORMAL LOW (ref 12.0–15.0)
MCH: 31.2 pg (ref 26.0–34.0)
MCHC: 32.6 g/dL (ref 30.0–36.0)
MCV: 95.8 fL (ref 80.0–100.0)
Platelets: 195 10*3/uL (ref 150–400)
RBC: 2.85 MIL/uL — ABNORMAL LOW (ref 3.87–5.11)
RDW: 13.1 % (ref 11.5–15.5)
WBC: 9.8 10*3/uL (ref 4.0–10.5)
nRBC: 0 % (ref 0.0–0.2)

## 2023-10-11 LAB — BASIC METABOLIC PANEL WITH GFR
Anion gap: 7 (ref 5–15)
BUN: 8 mg/dL (ref 6–20)
CO2: 23 mmol/L (ref 22–32)
Calcium: 7.5 mg/dL — ABNORMAL LOW (ref 8.9–10.3)
Chloride: 101 mmol/L (ref 98–111)
Creatinine, Ser: 0.59 mg/dL (ref 0.44–1.00)
GFR, Estimated: >60 mL/min (ref >60)
Glucose, Bld: 123 mg/dL — ABNORMAL HIGH (ref 70–99)
Potassium: 4.5 mmol/L (ref 3.5–5.1)
Sodium: 131 mmol/L — ABNORMAL LOW (ref 135–145)

## 2023-10-11 LAB — GLUCOSE, CAPILLARY
Glucose-Capillary: 100 mg/dL — ABNORMAL HIGH (ref 70–99)
Glucose-Capillary: 115 mg/dL — ABNORMAL HIGH (ref 70–99)
Glucose-Capillary: 121 mg/dL — ABNORMAL HIGH (ref 70–99)
Glucose-Capillary: 138 mg/dL — ABNORMAL HIGH (ref 70–99)
Glucose-Capillary: 138 mg/dL — ABNORMAL HIGH (ref 70–99)

## 2023-10-11 MED ORDER — OXYCODONE HCL 5 MG PO TABS
5.0000 mg | ORAL_TABLET | ORAL | Status: DC | PRN
  Administered 2023-10-11 – 2023-10-13 (×8): 5 mg via ORAL
  Administered 2023-10-14 – 2023-10-15 (×4): 10 mg via ORAL
  Filled 2023-10-11: qty 1
  Filled 2023-10-11: qty 2
  Filled 2023-10-11: qty 1
  Filled 2023-10-11: qty 2
  Filled 2023-10-11: qty 1
  Filled 2023-10-11: qty 2
  Filled 2023-10-11 (×4): qty 1
  Filled 2023-10-11: qty 2
  Filled 2023-10-11 (×2): qty 1

## 2023-10-11 MED ORDER — PANTOPRAZOLE SODIUM 40 MG PO TBEC
40.0000 mg | DELAYED_RELEASE_TABLET | Freq: Two times a day (BID) | ORAL | Status: DC
  Administered 2023-10-11 – 2023-10-19 (×17): 40 mg via ORAL
  Filled 2023-10-11 (×17): qty 1

## 2023-10-11 MED ORDER — ACETAMINOPHEN 500 MG PO TABS: 1000.0000 mg | ORAL_TABLET | Freq: Four times a day (QID) | ORAL | Status: DC | PRN

## 2023-10-11 MED ORDER — FUROSEMIDE 10 MG/ML IJ SOLN
60.0000 mg | Freq: Once | INTRAMUSCULAR | Status: AC
  Administered 2023-10-11: 60 mg via INTRAVENOUS
  Filled 2023-10-11: qty 6

## 2023-10-11 MED ORDER — HYDROMORPHONE HCL 1 MG/ML IJ SOLN
0.5000 mg | INTRAMUSCULAR | Status: DC | PRN
  Administered 2023-10-11 – 2023-10-15 (×14): 0.5 mg via INTRAVENOUS
  Filled 2023-10-11 (×5): qty 0.5
  Filled 2023-10-11: qty 1
  Filled 2023-10-11 (×7): qty 0.5
  Filled 2023-10-11: qty 1
  Filled 2023-10-11 (×2): qty 0.5

## 2023-10-11 MED ORDER — METHOCARBAMOL 1000 MG/10ML IJ SOLN: 1000.0000 mg | Freq: Three times a day (TID) | INTRAMUSCULAR | Status: DC

## 2023-10-11 MED ORDER — METHOCARBAMOL 500 MG PO TABS
1000.0000 mg | ORAL_TABLET | Freq: Three times a day (TID) | ORAL | Status: DC
  Administered 2023-10-11 – 2023-10-18 (×21): 1000 mg via ORAL
  Filled 2023-10-11 (×21): qty 2

## 2023-10-11 MED ORDER — FOLIC ACID 5 MG/ML IJ SOLN
1.0000 mg | Freq: Every day | INTRAMUSCULAR | Status: AC
  Administered 2023-10-11: 1 mg via INTRAVENOUS
  Filled 2023-10-11: qty 0.2

## 2023-10-11 MED ORDER — GUAIFENESIN ER 600 MG PO TB12
600.0000 mg | ORAL_TABLET | Freq: Two times a day (BID) | ORAL | Status: DC
  Administered 2023-10-11 – 2023-10-19 (×17): 600 mg via ORAL
  Filled 2023-10-11 (×17): qty 1

## 2023-10-11 NOTE — Progress Notes (Signed)
 Progress Note  1 Day Post-Op  Subjective: Pt reports abdominal pain that is different from prior to surgery. She still has a little nausea but significantly less than yesterday. She asked if she had a colostomy this AM and we discussed this. She would like to try to get up today once pain is better controlled. She does feel like she is getting a little congestion in her chest. She is very thankful for all the care she is receiving.   Objective: Vital signs in last 24 hours: Temp:  [97.3 F (36.3 C)-98.5 F (36.9 C)] 98.4 F (36.9 C) (05/30 0743) Pulse Rate:  [62-138] 76 (05/30 0500) Resp:  [8-26] 15 (05/30 0500) BP: (78-134)/(59-88) 78/59 (05/30 0400) SpO2:  [89 %-99 %] 91 % (05/30 0852) Arterial Line BP: (77-179)/(46-101) 115/65 (05/30 0500) FiO2 (%):  [50 %-100 %] 50 % (05/29 1815) Last BM Date : 10/10/23  Intake/Output from previous day: 05/29 0701 - 05/30 0700 In: 2822.9 [I.V.:2073; IV Piggyback:749.9] Out: 770 [Urine:230; Emesis/NG output:240; Blood:300] Intake/Output this shift: No intake/output data recorded.  PE: General: WD, obese female, appears uncomfortable but non-toxic  HEENT: Sclera anicteric Heart: RRR Lungs: on 4L via [place] which is baseline, slightly junky sounding cough  Abd: mildly distended, appropriately ttp, midline wound clean, ostomy viable without any gas or stool in bag Psych: A&Ox3 with an appropriate affect.    Lab Results:  Recent Labs    10/10/23 0402 10/10/23 1524 10/11/23 0408  WBC 17.1*  --  9.8  HGB 9.9* 10.2* 8.9*  HCT 30.1* 30.0* 27.3*  PLT 200  --  195   BMET Recent Labs    10/10/23 0402 10/10/23 1524 10/11/23 0408  NA 133* 134* 131*  K 3.6 3.8 4.5  CL 101  --  101  CO2 23  --  23  GLUCOSE 108*  --  123*  BUN <5*  --  8  CREATININE 0.79  --  0.59  CALCIUM  7.5*  --  7.5*   PT/INR Recent Labs    10/08/23 1633  LABPROT 13.6  INR 1.0   CMP     Component Value Date/Time   NA 131 (L) 10/11/2023 0408   NA 140  10/07/2020 0925   K 4.5 10/11/2023 0408   CL 101 10/11/2023 0408   CO2 23 10/11/2023 0408   GLUCOSE 123 (H) 10/11/2023 0408   BUN 8 10/11/2023 0408   BUN 12 10/07/2020 0925   CREATININE 0.59 10/11/2023 0408   CREATININE 0.76 04/25/2023 1136   CALCIUM  7.5 (L) 10/11/2023 0408   PROT 5.0 (L) 10/10/2023 0402   PROT 7.0 10/07/2020 0925   ALBUMIN 2.5 (L) 10/10/2023 0402   ALBUMIN 4.3 10/07/2020 0925   AST 34 10/10/2023 0402   ALT 12 10/10/2023 0402   ALKPHOS 55 10/10/2023 0402   BILITOT 1.2 10/10/2023 0402   BILITOT 0.2 10/07/2020 0925   GFRNONAA >60 10/11/2023 0408   GFRAA 81 08/17/2019 1238   Lipase     Component Value Date/Time   LIPASE 30 12/22/2022 1903       Studies/Results: No results found.  Anti-infectives: Anti-infectives (From admission, onward)    Start     Dose/Rate Route Frequency Ordered Stop   10/09/23 0800  metroNIDAZOLE  (FLAGYL ) IVPB 500 mg        500 mg 100 mL/hr over 60 Minutes Intravenous Every 12 hours 10/08/23 2220     10/09/23 0000  cefTRIAXone  (ROCEPHIN ) 2 g in sodium chloride  0.9 % 100 mL IVPB  2 g 200 mL/hr over 30 Minutes Intravenous Every 24 hours 10/08/23 2220     10/08/23 1730  aztreonam  (AZACTAM ) 2 g in sodium chloride  0.9 % 100 mL IVPB        2 g 200 mL/hr over 30 Minutes Intravenous  Once 10/08/23 1724 10/08/23 1812   10/08/23 1730  metroNIDAZOLE  (FLAGYL ) IVPB 500 mg        500 mg 100 mL/hr over 60 Minutes Intravenous  Once 10/08/23 1724 10/08/23 1854   10/08/23 1730  vancomycin  (VANCOCIN ) IVPB 1000 mg/200 mL premix        1,000 mg 200 mL/hr over 60 Minutes Intravenous  Once 10/08/23 1724 10/08/23 1954        Assessment/Plan Septic shock secondary to Sigmoid Diverticulitis with purulent ascites and peritonitis  POD1 s/p exploratory laparotomy, sigmoid colectomy, end colostomy Dr. Camilo Cella - no ostomy output yet but patient does report some improvement in nausea - ok to have ice chips and sips with meds today but would not  advance until having at least some gas via stoma - daily WTD to midline wound, likely VAC next week on Monday  - adjusted meds for pain control - continue foley today - continue abx through POD5, high risk for post-op abscess given purulent ascites - mobilize as tolerated, IS/flutter valve - WOC following for new colostomy    FEN: ice chips/sips with meds, IVF per CCM VTE: LMWH  ID: rocephin /flagyl    - per CCM -  COPD with chronic respiratory failure on 4L at home Alcohol use disorder with history of alcohol withdrawal HTN HLD Depression  Liver lesion - isodense lesion noted on CT, nonemergent MRI recommended  Migraines RLS Tobacco abuse 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

## 2023-10-11 NOTE — Evaluation (Signed)
 Physical Therapy Evaluation Patient Details Name: Vicki [name] MRN: [id number] DOB: 09-Sep-1965 Today's Date: 10/11/2023  History of Present Illness  57yo female admitted 10/08/23 with pelvic and lower back pain, diarrhea, sepsis due to diverticulitis. 5/29 Ex lap with sigmoid colectomy, end colostomy. 5/29 extubated.  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 Pt educated for splinting, rolling, transfers and functional mobility. Pt on 4L at baseline, 6L during today's session with desaturation to 83% during limited activity and increased time to recover in sitting with sats 88% at rest. Cues for breathing technique and function. Pt with decreased cardiopulmonary function, activity tolerance, transfers and gait who will benefit from acute therapy to maximize mobility and safety. HHPT recommended along with daily mobility OOB with nursing.   HR 99-114        If plan is discharge home, recommend the following: A little help with walking and/or transfers;A little help with bathing/dressing/bathroom;Assistance with cooking/housework;Assist for transportation   Can travel by private vehicle        Equipment Recommendations BSC/3in1  Recommendations for Other Services  OT consult    Functional Status Assessment Patient has had a recent decline in their functional status and demonstrates the ability to make significant improvements in function in a reasonable and predictable amount of time.     Precautions / Restrictions Precautions Precautions: Fall;Other (comment) Recall of Precautions/Restrictions: Impaired Precaution/Restrictions Comments: watch sats, desats with all movement, open abdomen, ostomy      Mobility  Bed Mobility Overal bed mobility: Needs Assistance Bed Mobility: Rolling, Sidelying to Sit Rolling: Min assist Sidelying to sit: Min assist, HOB elevated, Used rails        General bed mobility comments: HOB 30 degrees, increased time, assist to roll and rise, splinting abdomen with pillow    Transfers Overall transfer level: Needs assistance   Transfers: Sit to/from Stand Sit to Stand: Min assist           General transfer comment: min assist to power up for rise with cues for sequence and hand placement, CGA to lower to surface    Ambulation/Gait Ambulation/Gait assistance: Contact guard assist Gait Distance (Feet): 10 Feet Assistive device: Rolling walker (2 wheels) Gait Pattern/deviations: Step-through pattern, Decreased stride length, Trunk flexed   Gait velocity interpretation: <1.8 ft/sec, indicate of risk for recurrent falls   General Gait Details: pt with shorts steps, reliant on RW and flexed trunk due to pain. Pt with limited tolerance due to desaturation to 83% on 6L and fatigue. cues for breathing technique  Stairs            Wheelchair Mobility     Tilt Bed    Modified Rankin (Stroke Patients Only)       Balance Overall balance assessment: Needs assistance   Sitting balance-Leahy Scale: Fair     Standing balance support: Bilateral upper extremity supported, During functional activity, Reliant on assistive device for balance Standing balance-Leahy Scale: Poor Standing balance comment: RW in standing                             Pertinent Vitals/Pain Pain Assessment Pain Assessment: 0-10 Pain Score: 5  Pain Location: abdomen Pain Descriptors / Indicators: Aching, Guarding, Sore Pain Intervention(s): Limited activity within patient's tolerance, Monitored during session, Premedicated before session, Repositioned, Other (comment) (splinting abdomen with pillow for bed mobility and  standing)    Home Living Family/patient expects to be discharged to:: Private residence Living Arrangements: Spouse/significant other;Children Available Help at Discharge: Family;Available 24 hours/day Type of Home:  House Home Access: Stairs to enter   [employer] of Steps: 3   Home Layout: One level Home Equipment: Agricultural consultant (2 wheels);Cane - single point      Prior Function Prior Level of Function : Independent/Modified Independent;Needs assist             Mobility Comments: cane or rw, Limited distance <100' ADLs Comments: performs ADL with increased time, spouse does IADLs and most of the driving     Extremity/Trunk Assessment   Upper Extremity Assessment Upper Extremity Assessment: Generalized weakness    Lower Extremity Assessment Lower Extremity Assessment: Generalized weakness    Cervical / Trunk Assessment Cervical / Trunk Assessment: Other exceptions Cervical / Trunk Exceptions: abdomenal sx, guarding, open abdomen  Communication   Communication Communication: No apparent difficulties    Cognition Arousal: Alert Behavior During Therapy: WFL for tasks assessed/performed   PT - Cognitive impairments: No apparent impairments                         Following commands: Intact       Cueing Cueing Techniques: Verbal cues     General Comments      Exercises     Assessment/Plan    PT Assessment Patient needs continued PT services  PT Problem List Decreased activity tolerance;Decreased balance;Decreased mobility;Decreased knowledge of use of DME       PT Treatment Interventions Gait training;Stair training;Functional mobility training;Therapeutic activities;Patient/family education;Therapeutic exercise;Balance training;DME instruction    PT Goals (Current goals can be found in the Care Plan section)  Acute Rehab PT Goals Patient Stated Goal: return home PT Goal Formulation: With patient Time For Goal Achievement: 10/25/23 Potential to Achieve Goals: Good    Frequency Min 2X/week     Co-evaluation               AM-PAC PT "6 Clicks" Mobility  Outcome Measure Help needed turning from your back to your side while in a  flat bed without using bedrails?: A Little Help needed moving from lying on your back to sitting on the side of a flat bed without using bedrails?: A Little Help needed moving to and from a bed to a chair (including a wheelchair)?: A Little Help needed standing up from a chair using your arms (e.g., wheelchair or bedside chair)?: A Little Help needed to walk in hospital room?: A Lot Help needed climbing 3-5 steps with a railing? : Total 6 Click Score: 15    End of Session Equipment Utilized During Treatment: Oxygen Activity Tolerance: Patient tolerated treatment well Patient left: in chair;with call bell/phone within reach;with nursing/sitter in room Nurse Communication: Mobility status PT Visit Diagnosis: Other abnormalities of gait and mobility (R26.89);Difficulty in walking, not elsewhere classified (R26.2)    Time: 1610-9604 PT Time Calculation (min) (ACUTE ONLY): 28 min   Charges:   PT Evaluation $PT Eval Moderate Complexity: 1 Mod PT Treatments $Therapeutic Activity: 8-22 mins PT General Charges $$ ACUTE PT VISIT: 1 Visit         Annis Baseman, PT Acute Rehabilitation Services Office: [phone]   Jackey Mary Daymond Cordts 10/11/2023, 12:42 PM

## 2023-10-11 NOTE — Plan of Care (Signed)
  Problem: Education: Goal: Knowledge of General Education information will improve Description: Including pain rating scale, medication(s)/side effects and non-pharmacologic comfort measures Outcome: Progressing   Problem: Clinical Measurements: Goal: Ability to maintain clinical measurements within normal limits will improve Outcome: Progressing Goal: Will remain free from infection Outcome: Progressing Goal: Diagnostic test results will improve Outcome: Progressing Goal: Respiratory complications will improve Outcome: Progressing   Problem: Activity: Goal: Risk for activity intolerance will decrease Outcome: Progressing   Problem: Coping: Goal: Level of anxiety will decrease Outcome: Progressing   Problem: Pain Managment: Goal: General experience of comfort will improve and/or be controlled Outcome: Progressing   Problem: Safety: Goal: Ability to remain free from injury will improve Outcome: Progressing

## 2023-10-11 NOTE — Evaluation (Signed)
 Occupational Therapy Evaluation Patient Details Name: Vicki [name] MRN: [id number] DOB: 1966-02-10 Today's Date: 10/11/2023   History of Present Illness   57yo female admitted 10/08/23 with pelvic and lower back pain, diarrhea, sepsis due to diverticulitis. 5/29 Ex lap with sigmoid colectomy, end colostomy. 5/29 extubated.  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     Clinical Impressions Patient admitted for the diagnosis and procedure above.  PTA she lives with her spouse, who is able to provide any needed assist for ADL completion at home.  She would benefit from a shower chair initially, and OT will complete education regarding Reacher [employer] Sponge use for lower body ADL.  Patient presents with the deficits listed below, with abdominal discomfort the primary deficit.  Currently needing Min A for basic mobility and up to Mod A for ADL completion.  OT anticipates her functional status should improve as pain lessens.  No post acute OT is anticipated.       If plan is discharge home, recommend the following:   Assist for transportation;Assistance with cooking/housework;A little help with bathing/dressing/bathroom     Functional Status Assessment   Patient has had a recent decline in their functional status and demonstrates the ability to make significant improvements in function in a reasonable and predictable amount of time.     Equipment Recommendations   Tub/shower seat     Recommendations for Other Services         Precautions/Restrictions   Precautions Precautions: Fall Precaution/Restrictions Comments: watch sats, desats with all movement, open abdomen, ostomy Restrictions Weight Bearing Restrictions Per Provider Order: No     Mobility Bed Mobility Overal bed mobility: Needs Assistance Bed Mobility: Sit to Supine       Sit to supine: Mod assist   General bed mobility comments: increased time and assist for bringing legs up onto the  bed Patient Response: Cooperative  Transfers Overall transfer level: Needs assistance   Transfers: Sit to/from Stand, Bed to chair/wheelchair/BSC Sit to Stand: Min assist     Step pivot transfers: Min assist            Balance Overall balance assessment: Needs assistance Sitting-balance support: Feet supported Sitting balance-Leahy Scale: Good     Standing balance support: Bilateral upper extremity supported Standing balance-Leahy Scale: Poor                             ADL either performed or assessed with clinical judgement   ADL       Grooming: Wash/dry hands;Wash/dry face;Set up;Sitting           Upper Body Dressing : Moderate assistance;Sitting   Lower Body Dressing: Minimal assistance;Sit to/from stand   Toilet Transfer: Minimal assistance;Stand-pivot                   Vision Patient Visual Report: No change from baseline       Perception Perception: Not tested       Praxis Praxis: Not tested       Pertinent Vitals/Pain Pain Assessment Pain Assessment: Faces Faces Pain Scale: Hurts even more Pain Location: abdomen Pain Descriptors / Indicators: Jabbing, Guarding, Grimacing, Sore, Tender Pain Intervention(s): Monitored during session     Extremity/Trunk Assessment Upper Extremity Assessment Upper Extremity Assessment: Overall WFL for tasks assessed   Lower Extremity Assessment Lower Extremity Assessment: Defer to PT evaluation   Cervical / Trunk Assessment Cervical / Trunk Assessment: Other  exceptions Cervical / Trunk Exceptions: abdomenal sx, open abdomen   Communication Communication Communication: No apparent difficulties   Cognition Arousal: Alert Behavior During Therapy: WFL for tasks assessed/performed Cognition: No apparent impairments                               Following commands: Intact       Cueing  General Comments   Cueing Techniques: Verbal cues   Watch O2 sats    Exercises     Shoulder Instructions      Home Living Family/patient expects to be discharged to:: Private residence Living Arrangements: Spouse/significant other;Children Available Help at Discharge: Family;Available 24 hours/day Type of Home: House Home Access: Stairs to enter [employer] of Steps: 3   Home Layout: One level     Bathroom Shower/Tub: Chief Strategy Officer: Standard Bathroom Accessibility: Yes   Home Equipment: Agricultural consultant (2 wheels);Cane - single point          Prior Functioning/Environment Prior Level of Function : Independent/Modified Independent;Needs assist             Mobility Comments: cane or rw, Limited distance <100' ADLs Comments: performs ADL with increased time, spouse completes IADLs and community mobility.  Patient will participate with ilght iADL    OT Problem List: Decreased strength;Decreased activity tolerance;Impaired balance (sitting and/or standing);Pain   OT Treatment/Interventions: Self-care/ADL training;Therapeutic activities;DME and/or AE instruction;Balance training;Patient/family education      OT Goals(Current goals can be found in the care plan section)   Acute Rehab OT Goals Patient Stated Goal: Return home OT Goal Formulation: With patient Time For Goal Achievement: 10/25/23 Potential to Achieve Goals: Good ADL Goals Pt Will Perform Grooming: with modified independence;standing Pt Will Perform Lower Body Dressing: with modified independence;sit to/from stand;with adaptive equipment Pt Will Transfer to Toilet: with modified independence;regular height toilet;ambulating   OT Frequency:  Min 2X/week    Co-evaluation              AM-PAC OT "6 Clicks" Daily Activity     Outcome Measure Help from another person eating meals?: None Help from another person taking care of personal grooming?: None Help from another person toileting, which includes using toliet, bedpan, or urinal?: A  Little Help from another person bathing (including washing, rinsing, drying)?: A Lot Help from another person to put on and taking off regular upper body clothing?: A Lot Help from another person to put on and taking off regular lower body clothing?: A Little 6 Click Score: 18   End of Session Equipment Utilized During Treatment: Oxygen Nurse Communication: Mobility status  Activity Tolerance: Patient limited by pain Patient left: in bed;with call bell/phone within reach  OT Visit Diagnosis: Unsteadiness on feet (R26.81);Pain;Muscle weakness (generalized) (M62.81) Pain - part of body:  (abdominal)                Time: 1440-1500 OT Time Calculation (min): 20 min Charges:  OT General Charges $OT Visit: 1 Visit OT Evaluation $OT Eval Moderate Complexity: 1 Mod  10/11/2023  RP, OTR/L  Acute Rehabilitation Services  Office:  [phone]   Benjamen Brand 10/11/2023, 3:17 PM

## 2023-10-11 NOTE — Progress Notes (Signed)
 PT Cancellation Note  Patient Details Name: Vicki [name] MRN: [id number] DOB: 02/12/1966   Cancelled Treatment:    Reason Eval/Treat Not Completed: Pain limiting ability to participate (pt reports pain 8/10 despite medication and not yet agreeable to mobility)   Mileydi Milsap B Margaret Staggs 10/11/2023, 9:17 AM Annis Baseman, PT Acute Rehabilitation Services Office: [phone]

## 2023-10-11 NOTE — Consult Note (Signed)
 WOC Nurse ostomy consult note Stoma type/location: LLQ Colostomy Stomal assessment/size: 25 mm round. Stoma red and viable, edema 2+/4+, on the skin level. Peristomal assessment: intact, painful area. Treatment options for stomal/peristomal skin: Skin removal [id number], ring [id number], belt #621 Output 5 ml liquid pink. Ostomy pouching: 2pc. #[id number] and bag #234. Education provided: Teaching instructions about the ostomy care. The pt is anxious about her condition. We change the pouch with no hands on. Schedule a visit on MON with her husband to provide more informations.  - Clean the skin with water ans soup if needed.  - Teach how to close and open the lock in roll system. - Change pouch twice a week or if is leaking.  Obs: sensitive skin - use the skin remover wipes when change the barrier.  Enrolled patient in [employer] DC program: Not yet.  WOC team will follow MON at 1300. Please reconsult if further assistance is needed. Thank-you,  Rachel Budds BSN, RN, [employer], WOC  (Pager: [phone])

## 2023-10-11 NOTE — TOC Initial Note (Signed)
 Transition of Care [facility]) - Initial/Assessment Note    Patient Details  Name: Vicki [name] MRN: [id number] Date of Birth: 03/18/1966  Transition of [facility]) CM/SW Contact:    Juliane Och, LCSW Phone Number: 10/11/2023, 11:31 AM  Clinical Narrative:                  11:31 AM CSW introduced self and role to patient. Patient confirmed she resides at home with spouse who is able to provide transportation and home support if needed upon discharge. Patient confirmed that she does not have SNF/HH history. Patient confirmed that she has home DME (can, rolling walker). Per chart review, patient at home oxygen, nebulizer, and walker at well through [employer] and [employer]. Patient declined CSW offer of substance use resources.  Expected Discharge Plan: Home/Self Care Barriers to Discharge: Continued Medical Work up   Patient Goals and CMS Choice Patient states their goals for this hospitalization and ongoing recovery are:: to return home          Expected Discharge Plan and Services In-house Referral: Clinical Social Work     Living arrangements for the past 2 months: Single Family Home                                      Prior Living Arrangements/Services Living arrangements for the past 2 months: Single Family Home Lives with:: Spouse Patient language and need for interpreter reviewed:: Yes Do you feel safe going back to the place where you live?: Yes        Care giver support system in place?: Yes (comment)   Criminal Activity/Legal Involvement Pertinent to Current Situation/Hospitalization: No - Comment as needed  Activities of Daily Living      Permission Sought/Granted Permission sought to share information with : Family Supports Permission granted to share information with : No (Contact information on chart)  Share Information with NAME: Shacara Cozine     Permission granted to share info w Relationship: Spouse  Permission granted to share info w  Contact Information: [phone]  Emotional Assessment Appearance:: Appears stated age Attitude/Demeanor/Rapport: Engaged Affect (typically observed): Accepting, Adaptable, Calm, Appropriate, Pleasant, Stable Orientation: : Oriented to Self, Oriented to Place, Oriented to  Time, Oriented to Situation   Psych Involvement: No (comment)  Admission diagnosis:  Diverticulitis [K57.92] Sepsis (HCC) [A41.9] Continuous severe abdominal pain [R10.9] Sepsis, due to unspecified organism, unspecified whether acute organ dysfunction present [facility]) [A41.9] Patient Active Problem List   Diagnosis Date Noted   Sigmoid diverticulitis 10/09/2023   Hypokalemia 10/09/2023   Elevated AST (SGOT) 10/09/2023   Alcohol abuse 10/09/2023   Acute diarrhea 05/01/2023   Elevated glucose level 05/01/2023   Intractable nausea and vomiting 12/23/2022   Gastroenteritis 12/23/2022   Anxiety and depression 12/23/2022   Acute gastroenteritis 12/23/2022   Migraine without status migrainosus, not intractable 10/29/2022   GERD (gastroesophageal reflux disease) 10/29/2022   Ear pain, bilateral 10/29/2022   Encounter to establish care with new doctor 09/26/2022   Encounter for smoking cessation counseling 09/26/2022   Dehydration 04/12/2022   Septic shock (HCC) 04/12/2022   Leukocytosis 04/12/2022   Acute diverticulitis 04/11/2022   Multifocal pneumonia 04/06/2022   COPD exacerbation (HCC) 04/25/2021   Chronic respiratory failure with hypoxia (HCC) 04/25/2021   Essential hypertension 04/25/2021   AKI (acute kidney injury) (HCC) 04/25/2021   Aortic atherosclerosis (HCC) 10/28/2020   C. difficile colitis 10/19/2020  Diverticulitis 10/18/2020   Chronic urticaria 10/07/2020   Multiple drug allergies 10/07/2020   Anterior cruciate ligament complete tear 08/31/2020   Preop pulmonary/respiratory exam 07/28/2020   Polyp of transverse colon    Colon cancer screening    Generalized abdominal pain    Nausea and  vomiting    Mucosal abnormality of rectum    Hyperlipidemia 07/14/2019   RLS (restless legs syndrome) 07/14/2019   Tobacco abuse 07/14/2019   Essential hypertension, benign 07/14/2019   GAD (generalized anxiety disorder) 07/14/2019   CAP (community acquired pneumonia) 04/07/2019   UTI (urinary tract infection) 04/07/2019   SIRS (systemic inflammatory response syndrome) (HCC) 04/07/2019   Viral gastroenteritis 04/07/2019   Fatigue 11/07/2018   Depression 11/07/2018   Insomnia 11/07/2018   Polypharmacy 07/19/2017   Asthma    Carpal tunnel syndrome    COPD, severe (HCC)    DDD (degenerative disc disease), lumbar 10/22/2016   PCP:  Jenelle Mis, FNP Pharmacy:   [facility] [phone] Jonette Nestle, [place] - [address] [place] [address] Phone: [phone] Fax: [phone]     Social Drivers of Health (SDOH) Social History: SDOH Screenings   Food Insecurity: No Food Insecurity (11/01/2022)  Housing: Low Risk  (11/01/2022)  Transportation Needs: No Transportation Needs (11/01/2022)  Utilities: Not At Risk (11/01/2022)  Alcohol Screen: Low Risk  (11/01/2022)  Depression (PHQ2-9): High Risk (04/25/2023)  Financial Resource Strain: Low Risk  (11/01/2022)  Physical Activity: Inactive (11/01/2022)  Social Connections: Moderately Isolated (11/01/2022)  Stress: No Stress Concern Present (11/01/2022)  Tobacco Use: High Risk (10/10/2023)   SDOH Interventions:     Readmission Risk Interventions     No data to display

## 2023-10-11 NOTE — Consult Note (Addendum)
 NAME:  Vicki [name], MRN:  [id number], DOB:  [birth year], LOS: 3 ADMISSION DATE:  10/08/2023, CONSULTATION DATE:  10/09/2023 REFERRING MD:  Gillermo Lack, [facility] CHIEF COMPLAINT:  septic shock   History of Present Illness:  58 y/o female with PMH for COPD/Emphysema on 4-5 liters home O2 but still smoking (follows [facility]), Depression, esophagitis, recurrent Diverticulitis and recurrent C diff (follows with GI) who presented with abdominal pain x 1 week and also several days of n/v and 1 week of diarrhea. She says she initially thought hse had a UTI and trying to treat herself at home and with OTC meds but it did not get better and in the last 3 days has worsened. Therefore she came to the hospital. She denies blood in her stool. Her LA was 2.9 in ED and then repat was 8.7. She c/o generalized abdominal pain.   Pertinent  Medical History  Anxiety, Arthritis, Asthma, CAP (community acquired pneumonia) (04/07/2019), Carpal tunnel syndrome, Cataract, COPD (chronic obstructive pulmonary disease) (HCC), Depression, Diverticulitis, Emphysema of lung (HCC), Essential hypertension, benign (07/14/2019), Hyperlipidemia, Oxygen deficiency, and Oxygen dependent.   Significant Hospital Events: Including procedures, antibiotic start and stop dates in addition to other pertinent events   5/27: initially evaluated by PCCM, deferred ICU admission as BP stable, recommending continued IVF, antibiotics 5/28: re-consult for hypotension after IVF resuscitation, albumin  5/29 Failing conservative management. To OR>> found perforated diverticulitis with purulent peritonitis. S/p Ex-lap with sigmoid colectomy, end colostomy (Hartmann's procedure    Interim History / Subjective:  Came off of vasopressor support this morning Continue to complain of some nausea but denied abdominal pain White count trended down significantly   Objective    Blood pressure 95/72, pulse 84, temperature 98.4 F (36.9 C), temperature  source Oral, resp. rate 15, height 5\' 3"  (1.6 m), weight 78.7 kg, SpO2 90%.    Vent Mode: CPAP;PSV FiO2 (%):  [50 %-100 %] 50 % Set Rate:  [18 bmp-24 bmp] 24 bmp Vt Set:  [420 mL] 420 mL PEEP:  [5 cmH20] 5 cmH20 Pressure Support:  [6 cmH20] 6 cmH20 Plateau Pressure:  [13 cmH20] 13 cmH20   Intake/Output Summary (Last 24 hours) at 10/11/2023 1001 Last data filed at 10/11/2023 0300 Gross per 24 hour  Intake 2822.85 ml  Output 530 ml  Net 2292.85 ml   Filed Weights   10/09/23 1820  Weight: 78.7 kg    Examination: General: Middle-aged obese female, lying on the bed HEENT: [place]/AT, eyes anicteric.  moist mucus membranes Neuro: Alert, awake following commands Chest: Reduced air entry at the bases bilaterally, no wheezes or rhonchi Heart: Regular rate and rhythm, no murmurs or gallops Abdomen: Soft, nontender, nondistended, sluggish sounds present. Ostomy present  Labs and images reviewed   Patient Lines/Drains/Airways Status     Active Line/Drains/Airways     Name Placement date Placement time Site Days   Arterial Line 10/10/23 Left Radial 10/10/23  1145  Radial  1   Peripheral IV 10/08/23 20 G Right Forearm 10/08/23  1730  Forearm  3   Peripheral IV 10/09/23 20 G 1.25" Posterior;Right Forearm 10/09/23  1025  Forearm  2   Peripheral IV 10/10/23 18 G 1.16" Left Antecubital 10/10/23  1130  Antecubital  1   Colostomy LLQ 10/10/23  1315  LLQ  1   Urethral Catheter T. Harper RN Latex 16 Fr. 10/10/23  1156  Latex  1   Incision (Closed) 10/10/23 Abdomen 10/10/23  1318  -- 1  Resolved Hospital Problem list   Hypomagnesemia/hypokalemia  Assessment & Plan:  Septic shock secondary to sigmoid diverticulitis with acute peritonitis status post ex lap, sigmoid colectomy and end ostomy Lactic acidosis  History of recurrent diverticulitis and recurrent C diff infections  Shock has resolved Patient came off of vasopressor support Continue IV ceftriaxone  and metronidazole  General  Surgery is following Recommend continue ice chips but remain n.p.o. Continue Zofran  and Compazine  Lactic acidosis has resolved Continue Protonix  twice daily  Hyponatremia/hypocalcemia Closely monitor electrolytes and supplement  Alcohol abuse Tobacco dependence Watch for signs of withdrawal Cessation counseling provided, patient is determining to quit smoking  Acute on chronic hypoxic/hypercapnic respiratory failure with COPD on 4L home O2  Currently on 6 L nasal cannula oxygen, titrate with O2 sat goal 88-92% Continue inhalers Added Lasix as she looks volume overloaded  Hypertension Hyperlipidemia Hold oral meds for now until cleared by surgery  Depression  Insomnia  Hold Celexa , Requip  and trazodone  until she is able to take p.o.  Obesity Diet and exercise counseling provided  Isodense liver lesion Needs MRI liver at some point before discharge  Best Practice (right click and "Reselect all SmartList Selections" daily)   Diet/type: NPO DVT prophylaxis: LMWH GI prophylaxis: PPI Lines: N/A Foley: Yes still needed Code Status:  full code Last date of multidisciplinary goals of care discussion.  5/30 patient was updated at bedside  Labs   CBC: Recent Labs  Lab 10/08/23 1633 10/09/23 0442 10/09/23 1907 10/10/23 0402 10/10/23 1524 10/11/23 0408  WBC 11.3* 9.8 4.5 17.1*  --  9.8  NEUTROABS 9.4*  --  3.8 15.7*  --   --   HGB 13.0 10.4* 10.0* 9.9* 10.2* 8.9*  HCT 39.6 32.1* 30.8* 30.1* 30.0* 27.3*  MCV 95.2 96.4 97.8 96.8  --  95.8  PLT 213 161 129* 200  --  195    Basic Metabolic Panel: Recent Labs  Lab 10/08/23 1633 10/09/23 0442 10/09/23 1907 10/09/23 2108 10/10/23 0402 10/10/23 1524 10/11/23 0408  NA 138 137 135  --  133* 134* 131*  K 3.2* 2.9* 3.3*  --  3.6 3.8 4.5  CL 95* 99 100  --  101  --  101  CO2 31 32 16*  --  23  --  23  GLUCOSE 133* 108* 94  --  108*  --  123*  BUN <5* <5* 5*  --  <5*  --  8  CREATININE 0.70 0.74 0.83  --  0.79  --   0.59  CALCIUM  7.9* 7.8* 7.4*  --  7.5*  --  7.5*  MG  --  0.7*  --  2.5*  --   --   --   PHOS  --  2.2* 2.6  --  2.5  --   --    GFR: Estimated Creatinine Clearance: 78.9 mL/min (by C-G formula based on SCr of 0.59 mg/dL). Recent Labs  Lab 10/09/23 0030 10/09/23 0438 10/09/23 0442 10/09/23 1907 10/09/23 2108 10/10/23 0019 10/10/23 0402 10/11/23 0408  PROCALCITON  --   --   --   --  3.64  --   --   --   WBC  --   --  9.8 4.5  --   --  17.1* 9.8  LATICACIDVEN 2.0* 1.6  --   --  5.2* 4.5*  --   --     Liver Function Tests: Recent Labs  Lab 10/08/23 1633 10/09/23 0442 10/09/23 1907 10/10/23 0402  AST 61* 45* 41  34  ALT 16 13 11 12   ALKPHOS 121 78 74 55  BILITOT 1.2 1.0 1.4* 1.2  PROT 6.9 5.2* 4.9* 5.0*  ALBUMIN 3.4* 2.4* 2.5* 2.5*   No results for input(s): "LIPASE", "AMYLASE" in the last 168 hours. No results for input(s): "AMMONIA" in the last 168 hours.  ABG    Component Value Date/Time   PHART 7.198 (LL) 10/10/2023 1524   PCO2ART 58.1 (H) 10/10/2023 1524   PO2ART 226 (H) 10/10/2023 1524   HCO3 22.6 10/10/2023 1524   TCO2 24 10/10/2023 1524   ACIDBASEDEF 6.0 (H) 10/10/2023 1524   O2SAT 100 10/10/2023 1524     Coagulation Profile: Recent Labs  Lab 10/08/23 1633  INR 1.0    Cardiac Enzymes: No results for input(s): "CKTOTAL", "CKMB", "CKMBINDEX", "TROPONINI" in the last 168 hours.  HbA1C: Hgb A1c MFr Bld  Date/Time Value Ref Range Status  04/25/2023 11:36 AM 5.3 <5.7 % of total Hgb Final    Comment:    For the purpose of screening for the presence of diabetes: . <5.7%       Consistent with the absence of diabetes 5.7-6.4%    Consistent with increased risk for diabetes             (prediabetes) > or =6.5%  Consistent with diabetes . This assay result is consistent with a decreased risk of diabetes. . Currently, no consensus exists regarding use of hemoglobin A1c for diagnosis of diabetes in children. . According to American Diabetes  Association (ADA) guidelines, hemoglobin A1c <7.0% represents optimal control in non-pregnant diabetic patients. Different metrics may apply to specific patient populations.  Standards of Medical Care in Diabetes(ADA). Aaron Aas   07/10/2021 01:20 PM 5.4 4.6 - 6.5 % Final    Comment:    Glycemic Control Guidelines for People with Diabetes:Non Diabetic:  <6%Goal of Therapy: <7%Additional Action Suggested:  >8%     CBG: Recent Labs  Lab 10/10/23 1528 10/10/23 1941 10/10/23 2318 10/11/23 0311 10/11/23 0741  GLUCAP 133* 156* 151* 138* 138*      Trevor Fudge, MD [facility] See Amion for pager If no response to pager, please call [phone] until 7pm After 7pm, Please call E-link [phone]

## 2023-10-12 DIAGNOSIS — J9611 Chronic respiratory failure with hypoxia: Secondary | ICD-10-CM | POA: Diagnosis not present

## 2023-10-12 DIAGNOSIS — J449 Chronic obstructive pulmonary disease, unspecified: Secondary | ICD-10-CM | POA: Diagnosis not present

## 2023-10-12 DIAGNOSIS — A419 Sepsis, unspecified organism: Secondary | ICD-10-CM | POA: Diagnosis not present

## 2023-10-12 DIAGNOSIS — K5732 Diverticulitis of large intestine without perforation or abscess without bleeding: Secondary | ICD-10-CM | POA: Diagnosis not present

## 2023-10-12 LAB — CBC
HCT: 26.5 % — ABNORMAL LOW (ref 36.0–46.0)
Hemoglobin: 9 g/dL — ABNORMAL LOW (ref 12.0–15.0)
MCH: 32.3 pg (ref 26.0–34.0)
MCHC: 34 g/dL (ref 30.0–36.0)
MCV: 95 fL (ref 80.0–100.0)
Platelets: 217 10*3/uL (ref 150–400)
RBC: 2.79 MIL/uL — ABNORMAL LOW (ref 3.87–5.11)
RDW: 13.2 % (ref 11.5–15.5)
WBC: 11 10*3/uL — ABNORMAL HIGH (ref 4.0–10.5)
nRBC: 0 % (ref 0.0–0.2)

## 2023-10-12 LAB — BASIC METABOLIC PANEL WITH GFR
Anion gap: 9 (ref 5–15)
BUN: 15 mg/dL (ref 6–20)
CO2: 26 mmol/L (ref 22–32)
Calcium: 7.7 mg/dL — ABNORMAL LOW (ref 8.9–10.3)
Chloride: 99 mmol/L (ref 98–111)
Creatinine, Ser: 0.64 mg/dL (ref 0.44–1.00)
GFR, Estimated: >60 mL/min (ref >60)
Glucose, Bld: 74 mg/dL (ref 70–99)
Potassium: 3.7 mmol/L (ref 3.5–5.1)
Sodium: 134 mmol/L — ABNORMAL LOW (ref 135–145)

## 2023-10-12 LAB — GLUCOSE, CAPILLARY
Glucose-Capillary: 94 mg/dL (ref 70–99)
Glucose-Capillary: 85 mg/dL (ref 70–99)
Glucose-Capillary: 71 mg/dL (ref 70–99)
Glucose-Capillary: 93 mg/dL (ref 70–99)
Glucose-Capillary: 86 mg/dL (ref 70–99)
Glucose-Capillary: 116 mg/dL — ABNORMAL HIGH (ref 70–99)

## 2023-10-12 MED ORDER — IPRATROPIUM-ALBUTEROL 0.5-2.5 (3) MG/3ML IN SOLN
RESPIRATORY_TRACT | Status: AC
  Filled 2023-10-12: qty 3

## 2023-10-12 MED ORDER — ALPRAZOLAM 0.5 MG PO TABS
0.5000 mg | ORAL_TABLET | Freq: Three times a day (TID) | ORAL | Status: DC | PRN
  Administered 2023-10-12 – 2023-10-17 (×7): 0.5 mg via ORAL
  Filled 2023-10-12 (×9): qty 1

## 2023-10-12 MED ORDER — ACETAMINOPHEN 500 MG PO TABS
1000.0000 mg | ORAL_TABLET | Freq: Four times a day (QID) | ORAL | Status: DC
  Administered 2023-10-12 – 2023-10-19 (×23): 1000 mg via ORAL
  Filled 2023-10-12 (×25): qty 2

## 2023-10-12 MED ORDER — MELATONIN 5 MG PO TABS
5.0000 mg | ORAL_TABLET | Freq: Every evening | ORAL | Status: DC | PRN
  Administered 2023-10-12 – 2023-10-15 (×4): 5 mg via ORAL
  Filled 2023-10-12 (×4): qty 1

## 2023-10-12 MED ORDER — SODIUM CHLORIDE 0.9 % IV SOLN
2.0000 g | Freq: Three times a day (TID) | INTRAVENOUS | Status: AC
  Administered 2023-10-12 – 2023-10-17 (×15): 2 g via INTRAVENOUS
  Filled 2023-10-12 (×15): qty 12.5

## 2023-10-12 NOTE — Progress Notes (Signed)
 PROGRESS NOTE        PATIENT DETAILS Name: Vicki [name] Age: 58 y.o. Sex: female Date of Birth: [birth year] Admit Date: 10/08/2023 Admitting Physician Lena Qualia, MD WJX:[id number], Schuyler Custard, FNP  Brief Summary: Patient is a 58 y.o.  female with history of COPD on 4 L of oxygen at home-presented with abdominal pain secondary to sigmoid diverticulosis, hospital course complicated by development of septic shock requiring pressors.  She subsequently developed peritonitis and was taken to the operating room where she was found to have perforated diverticulitis with purulent peritonitis and underwent Hartman's procedure.    Significant events: 5/27>> admit to [facility]-severe sepsis secondary to sigmoid diverticulitis-BP soft-PCCM consulted-keep on floor. 5/28>> persistent hypotension-septic shock-transferred to ICU 5/29>> peritonitis-persistent septic shock-2 OR-perforated diverticulitis with purulent peritonitis-underwent Hartman's procedure. 5/31>> transferred back to [facility]  Significant studies: 5/27>> CT abdomen/pelvis: Severe sigmoid diverticulitis without associated abscess/perforation.  Significant microbiology data: 5/27>> COVID/influenza/RSV PCR: Negative 5/27>> stool C. difficile studies: Negative 5/27>> blood cultures: Negative  Procedures: 5/29>> Hartman's procedure  Consults: PCCM CCS  Subjective: Lying comfortably in bed-denies any chest pain or shortness of breath.  Some pain at the operative site.  No output from ostomy yet.  Objective: Vitals: Blood pressure 107/74, pulse 87, temperature 98.4 F (36.9 C), temperature source Oral, resp. rate 16, height 5\' 3"  (1.6 m), weight 78.7 kg, SpO2 92%.   Exam: Gen Exam:Alert awake-not in any distress HEENT:atraumatic, normocephalic Chest: B/L clear to auscultation anteriorly CVS:S1S2 regular Abdomen: Soft-appropriately tender-empty ostomy bag.  Midline dressing-not soaked. Extremities:no  edema Neurology: Non focal Skin: no rash  Pertinent Labs/Radiology:    Latest Ref Rng & Units 10/11/2023    4:08 AM 10/10/2023    3:24 PM 10/10/2023    4:02 AM  CBC  WBC 4.0 - 10.5 K/uL 9.8   17.1   Hemoglobin 12.0 - 15.0 g/dL 8.9  95.6  9.9   Hematocrit 36.0 - 46.0 % 27.3  30.0  30.1   Platelets 150 - 400 K/uL 195   200     Lab Results  Component Value Date   NA 131 (L) 10/11/2023   K 4.5 10/11/2023   CL 101 10/11/2023   CO2 23 10/11/2023      Assessment/Plan: Septic shock secondary to sigmoid diverticulitis with perforation resulting in peritonitis-now s/p Hartman's procedure 5/29. Sepsis physiology has resolved Required pressors while in the ICU Remains on Rocephin /Flagyl  General Surgery following and directing postop care.  Acute on chronic hypoxic/hypercapnic respiratory failure (on 4 L of oxygen at home) Worsening hypoxia-secondary to sepsis/pain-causing splitting-background of COPD Improving-Down to 4 L of oxygen this morning  Normocytic anemia Secondary to critical illness Follow CBC and transfuse if significant drop  COPD Stable Continue bronchodilators  HTN BP stable without the use of any antihypertensives Resume amlodipine  when able  Anxiety/depression Resume Celexa  when oral intake is more stable.  GERD PPI  EtOH abuse No signs of alcohol withdrawal Continue to monitor closely  Tobacco abuse Counseled  Class 1 Obesity: Estimated body mass index is 30.73 kg/m as calculated from the following:   Height as of this encounter: 5\' 3"  (1.6 m).   Weight as of this encounter: 78.7 kg.   Code status:   Code Status: Full Code   DVT Prophylaxis: enoxaparin  (LOVENOX ) injection 40 mg Start: 10/11/23 1000 SCDs Start: 10/09/23 1722  Family Communication: None at bedside   Disposition Plan: Status is: Inpatient Remains inpatient appropriate because: Severity of illness   Planned Discharge Destination:Home health   Diet: Diet Order              Diet NPO time specified Except for: Ice Chips, Sips with Meds  Diet effective now                     Antimicrobial agents: Anti-infectives (From admission, onward)    Start     Dose/Rate Route Frequency Ordered Stop   10/09/23 0800  metroNIDAZOLE  (FLAGYL ) IVPB 500 mg        500 mg 100 mL/hr over 60 Minutes Intravenous Every 12 hours 10/08/23 2220 10/15/23 2359   10/09/23 0000  cefTRIAXone  (ROCEPHIN ) 2 g in sodium chloride  0.9 % 100 mL IVPB        2 g 200 mL/hr over 30 Minutes Intravenous Every 24 hours 10/08/23 2220 10/15/23 2359   10/08/23 1730  aztreonam  (AZACTAM ) 2 g in sodium chloride  0.9 % 100 mL IVPB        2 g 200 mL/hr over 30 Minutes Intravenous  Once 10/08/23 1724 10/08/23 1812   10/08/23 1730  metroNIDAZOLE  (FLAGYL ) IVPB 500 mg        500 mg 100 mL/hr over 60 Minutes Intravenous  Once 10/08/23 1724 10/08/23 1854   10/08/23 1730  vancomycin  (VANCOCIN ) IVPB 1000 mg/200 mL premix        1,000 mg 200 mL/hr over 60 Minutes Intravenous  Once 10/08/23 1724 10/08/23 1954        MEDICATIONS: Scheduled Meds:  budesonide -glycopyrrolate -formoterol   2 puff Inhalation BID   enoxaparin  (LOVENOX ) injection  40 mg Subcutaneous Q24H   guaiFENesin   600 mg Oral BID   ipratropium-albuterol   3 mL Nebulization BID   methocarbamol   1,000 mg Oral TID   pantoprazole   40 mg Oral BID   sodium chloride  flush  3 mL Intravenous Q12H   thiamine   100 mg Oral Daily   Continuous Infusions:  cefTRIAXone  (ROCEPHIN )  IV 2 g (10/11/23 2343)   metronidazole  500 mg (10/11/23 1957)   PRN Meds:.acetaminophen , albuterol , azelastine , HYDROmorphone  (DILAUDID ) injection, ondansetron  **OR** ondansetron  (ZOFRAN ) IV, oxyCODONE , prochlorperazine , SUMAtriptan    I have personally reviewed following labs and imaging studies  LABORATORY DATA: CBC: Recent Labs  Lab 10/08/23 1633 10/09/23 0442 10/09/23 1907 10/10/23 0402 10/10/23 1524 10/11/23 0408  WBC 11.3* 9.8 4.5 17.1*  --  9.8   NEUTROABS 9.4*  --  3.8 15.7*  --   --   HGB 13.0 10.4* 10.0* 9.9* 10.2* 8.9*  HCT 39.6 32.1* 30.8* 30.1* 30.0* 27.3*  MCV 95.2 96.4 97.8 96.8  --  95.8  PLT 213 161 129* 200  --  195    Basic Metabolic Panel: Recent Labs  Lab 10/08/23 1633 10/09/23 0442 10/09/23 1907 10/09/23 2108 10/10/23 0402 10/10/23 1524 10/11/23 0408  NA 138 137 135  --  133* 134* 131*  K 3.2* 2.9* 3.3*  --  3.6 3.8 4.5  CL 95* 99 100  --  101  --  101  CO2 31 32 16*  --  23  --  23  GLUCOSE 133* 108* 94  --  108*  --  123*  BUN <5* <5* 5*  --  <5*  --  8  CREATININE 0.70 0.74 0.83  --  0.79  --  0.59  CALCIUM  7.9* 7.8* 7.4*  --  7.5*  --  7.5*  MG  --  0.7*  --  2.5*  --   --   --   PHOS  --  2.2* 2.6  --  2.5  --   --     GFR: Estimated Creatinine Clearance: 78.9 mL/min (by C-G formula based on SCr of 0.59 mg/dL).  Liver Function Tests: Recent Labs  Lab 10/08/23 1633 10/09/23 0442 10/09/23 1907 10/10/23 0402  AST 61* 45* 41 34  ALT 16 13 11 12   ALKPHOS 121 78 74 55  BILITOT 1.2 1.0 1.4* 1.2  PROT 6.9 5.2* 4.9* 5.0*  ALBUMIN  3.4* 2.4* 2.5* 2.5*   No results for input(s): "LIPASE", "AMYLASE" in the last 168 hours. No results for input(s): "AMMONIA" in the last 168 hours.  Coagulation Profile: Recent Labs  Lab 10/08/23 1633  INR 1.0    Cardiac Enzymes: No results for input(s): "CKTOTAL", "CKMB", "CKMBINDEX", "TROPONINI" in the last 168 hours.  BNP (last 3 results) No results for input(s): "PROBNP" in the last 8760 hours.  Lipid Profile: No results for input(s): "CHOL", "HDL", "LDLCALC", "TRIG", "CHOLHDL", "LDLDIRECT" in the last 72 hours.  Thyroid Function Tests: No results for input(s): "TSH", "T4TOTAL", "FREET4", "T3FREE", "THYROIDAB" in the last 72 hours.  Anemia Panel: No results for input(s): "VITAMINB12", "FOLATE", "FERRITIN", "TIBC", "IRON", "RETICCTPCT" in the last 72 hours.  Urine analysis:    Component Value Date/Time   COLORURINE AMBER (A) 10/08/2023 1634    APPEARANCEUR HAZY (A) 10/08/2023 1634   APPEARANCEUR Cloudy (A) 04/23/2019 1141   LABSPEC 1.032 (H) 10/08/2023 1634   PHURINE 5.0 10/08/2023 1634   GLUCOSEU NEGATIVE 10/08/2023 1634   GLUCOSEU NEGATIVE 08/18/2020 1346   HGBUR NEGATIVE 10/08/2023 1634   BILIRUBINUR NEGATIVE 10/08/2023 1634   BILIRUBINUR negative 07/14/2019 1342   BILIRUBINUR Negative 04/23/2019 1141   KETONESUR 5 (A) 10/08/2023 1634   PROTEINUR 30 (A) 10/08/2023 1634   UROBILINOGEN 0.2 08/18/2020 1346   NITRITE NEGATIVE 10/08/2023 1634   LEUKOCYTESUR NEGATIVE 10/08/2023 1634    Sepsis Labs: Lactic Acid, Venous    Component Value Date/Time   LATICACIDVEN 4.5 (HH) 10/10/2023 0019    MICROBIOLOGY: Recent Results (from the past 240 hours)  Blood Culture (routine x 2)     Status: None (Preliminary result)   Collection Time: 10/08/23  4:55 PM   Specimen: BLOOD LEFT ARM  Result Value Ref Range Status   Specimen Description BLOOD LEFT ARM  Final   Special Requests   Final    BOTTLES DRAWN AEROBIC AND ANAEROBIC Blood Culture adequate volume   Culture   Final    NO GROWTH 4 DAYS Performed at [facility], [address] [place], [place] [address]    Report Status PENDING  Incomplete  Blood Culture (routine x 2)     Status: None (Preliminary result)   Collection Time: 10/08/23  5:00 PM   Specimen: BLOOD RIGHT HAND  Result Value Ref Range Status   Specimen Description BLOOD RIGHT HAND  Final   Special Requests   Final    BOTTLES DRAWN AEROBIC AND ANAEROBIC Blood Culture adequate volume   Culture   Final    NO GROWTH 4 DAYS Performed at [facility], [address], [place], [place] [address]    Report Status PENDING  Incomplete  C Difficile Quick Screen w PCR reflex     Status: None   Collection Time: 10/08/23  5:23 PM   Specimen: STOOL  Result Value Ref Range Status   C Diff antigen  NEGATIVE NEGATIVE Final   C Diff toxin NEGATIVE NEGATIVE Final   C Diff interpretation No C. difficile detected.   Final    Comment: Performed at [facility], [address], [place], [place] [address]  Resp panel by RT-PCR (RSV, Flu A&B, Covid) Anterior Nasal Swab     Status: None   Collection Time: 10/08/23  5:33 PM   Specimen: Anterior Nasal Swab  Result Value Ref Range Status   SARS Coronavirus 2 by RT PCR NEGATIVE NEGATIVE Final   Influenza A by PCR NEGATIVE NEGATIVE Final   Influenza B by PCR NEGATIVE NEGATIVE Final    Comment: (NOTE) The Xpert Xpress SARS-CoV-2/FLU/RSV plus assay is intended as an aid in the diagnosis of influenza from Nasopharyngeal swab specimens and should not be used as a sole basis for treatment. Nasal washings and aspirates are unacceptable for Xpert Xpress SARS-CoV-2/FLU/RSV testing.  Fact Sheet for Patients: BloggerCourse.com  Fact Sheet for Healthcare Providers: SeriousBroker.it  This test is not yet approved or cleared by the [place]  FDA and has been authorized for detection and/or diagnosis of SARS-CoV-2 by FDA under an Emergency Use Authorization (EUA). This EUA will remain in effect (meaning this test can be used) for the duration of the COVID-19 declaration under Section 564(b)(1) of the Act, 21 U.S.C. section 360bbb-3(b)(1), unless the authorization is terminated or revoked.     Resp Syncytial Virus by PCR NEGATIVE NEGATIVE Final    Comment: (NOTE) Fact Sheet for Patients: BloggerCourse.com  Fact Sheet for Healthcare Providers: SeriousBroker.it  This test is not yet approved or cleared by the [place]  FDA and has been authorized for detection and/or diagnosis of SARS-CoV-2 by FDA under an Emergency Use Authorization (EUA). This EUA will remain in effect (meaning this test can be used) for the duration of the COVID-19 declaration under Section 564(b)(1) of the Act, 21 U.S.C. section 360bbb-3(b)(1), unless the authorization is  terminated or revoked.  Performed at [facility], [address], [place], [place] [address]   MRSA Next Gen by PCR, Nasal     Status: None   Collection Time: 10/09/23  5:22 PM   Specimen: STOOL; Nasal Swab  Result Value Ref Range Status   MRSA by PCR Next Gen NOT DETECTED NOT DETECTED Final    Comment: (NOTE) The GeneXpert MRSA Assay (FDA approved for NASAL specimens only), is one component of a comprehensive MRSA colonization surveillance program. It is not intended to diagnose MRSA infection nor to guide or monitor treatment for MRSA infections. Test performance is not FDA approved in patients less than 52 years old. Performed at [facility], [address], [place], [place] [address]     RADIOLOGY STUDIES/RESULTS: No results found.   LOS: 4 days   Kimberly Penna, MD  [facility]    To contact the attending provider between 7A-7P or the covering provider during after hours 7P-7A, please log into the web site [email or web address] and access using universal [place] password for that web site. If you do not have the password, please call the hospital operator.  10/12/2023, 8:42 AM

## 2023-10-12 NOTE — Progress Notes (Signed)
 10/12/2023  Vicki [name] [id number] [birth year]  CARE TEAM: PCP: Jenelle Mis, FNP  Outpatient Care Team: Patient Care Team: Jenelle Mis, FNP as PCP - General (Family Medicine) Margaretann Sharper, MD as Consulting Physician (Pulmonary Disease) Associates, [facility]  Inpatient Treatment Team: Treatment Team:  Burton Casey, MD [facility], Md, MD Jenifer Miu, Velta Gianotti, RN Trevor Fudge, MD Tyrone Gallop, MD Kathaleen Pale, RN Adaline Ada, [facility] Kalman Ores, [place] Richardine Chancy, RN Audrea Blender, RN Waunita Haff, RN Swist, Debbie, RN Duffy, Florene Huntington, LCSWA   Problem List:   Principal Problem:   Septic shock [facility]) Active Problems:   COPD, severe (HCC)   Insomnia   Hyperlipidemia   Tobacco abuse   Chronic respiratory failure with hypoxia [facility])   Essential hypertension   GERD (gastroesophageal reflux disease)   Anxiety and depression   Sigmoid diverticulitis   Hypokalemia   Elevated AST (SGOT)   Alcohol abuse   10/10/2023  PRE-OPERATIVE DIAGNOSIS:  Perforated diverticulitis   POST-OPERATIVE DIAGNOSIS:  Perforated diverticulitis with purulent peritonitis   PROCEDURE:  Exploratory laparotomy with sigmoid colectomy, end colostomy (Hartmann's procedure)   SURGEON:  Renford Cartwright. White, MD  FINDINGS: Purulent peritonitis with sigmoid diverticulitis. Hartmann's procedure carried out otherwise uneventfully   Assessment [facility] Stay = 4 days) 2 Days Post-Op    Okay    Assessment/Plan Septic shock secondary to Sigmoid Diverticulitis with purulent ascites and peritonitis  POD1 s/p exploratory laparotomy, sigmoid colectomy, end colostomy Dr. Camilo Cella - no ostomy output yet but patient does report some improvement in nausea - ok to have ice chips and sips with meds today but would not advance until having at least some gas via stoma - Wound appears to be cleaning up.  See if we can start wound VAC   - adjusted meds for pain  control - continue foley today - continue abx through POD5.  With increasing leukocytosis and some question of ileus switch to cefepime  for more appropriate coverage given complex severe diverticulitis.  Low threshold to do CAT scan if not better by postop day 5 - mobilize as tolerated, IS/flutter valve - WOC following for new colostomy    FEN: ice chips/sips with meds, IVF per CCM VTE: LMWH  ID: rocephin /flagyl  -5/31.  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  Questions were answered.  She expressed understanding & appreciation.  -Disposition: To be determined.  Still many days to go t.      I reviewed nursing notes, hospitalist notes, last 24 h vitals and pain scores, last 48 h intake and output, last 24 h labs and trends, and last 24 h imaging results.  I have reviewed this patient's available data, including medical history, events of note, test results, etc as part of my evaluation.   A significant portion of that time was spent in counseling. Care during the described time interval was provided by me.  This care required moderate level of medical decision making.  10/12/2023    Subjective: (Chief complaint)  Patient feeling more sore and puny today.  Did walk in bathroom.  Feeling full or bloated not want  to do more than sips.  Objective:  Vital signs:  Vitals:   10/11/23 2347 10/12/23 0243 10/12/23 0343 10/12/23 0748  BP: 99/68 94/70 99/67  107/74  Pulse: 87 84 86 87  Resp: 17 13 17 16   Temp: 97.9 F (36.6 C) 98 F (36.7 C) 97.8 F (36.6 C) 98.4 F (36.9 C)  TempSrc: Oral Oral Oral Oral  SpO2: 93% 94% 92% 92%  Weight:       Height:        Last BM Date : 10/10/23  Intake/Output   Yesterday:  05/30 0701 - 05/31 0700 In: -  Out: 1000 [Urine:1000] This shift:  No intake/output data recorded.  Bowel function:  Flatus: No  BM:  No  Drain: (No drain)   Physical Exam:  General: Pt awake/alert in no acute distress Eyes: PERRL, normal EOM.  Sclera clear.  No icterus Neuro: CN II-XII intact w/o focal sensory/motor deficits. Lymph: No head/neck/groin lymphadenopathy Psych:  No delerium/psychosis/paranoia.  Oriented x 4.  Mildly worried but consolable HENT: Normocephalic, Mucus membranes moist.  No thrush Neck: Supple, No tracheal deviation.  No obvious thyromegaly Chest: No pain to chest wall compression.  Good respiratory excursion.  No audible wheezing CV:  Pulses intact.  Regular rhythm.  No major extremity edema MS: Normal AROM mjr joints.  No obvious deformity  Abdomen: Soft.  Mildy distended.  Mildly tender at incisions only.  No evidence of peritonitis.  No incarcerated hernias. Midline wound packed with good granulation and subcutaneous tissues.  No major purulence.  Ext:   No deformity.  No mjr edema.  No cyanosis Skin: No petechiae / purpurea.  No major sores.  Warm and dry    Results:   Cultures: Recent Results (from the past 720 hours)  Blood Culture (routine x 2)     Status: None (Preliminary result)   Collection Time: 10/08/23  4:55 PM   Specimen: BLOOD LEFT ARM  Result Value Ref Range Status   Specimen Description BLOOD LEFT ARM  Final   Special Requests   Final    BOTTLES DRAWN AEROBIC AND ANAEROBIC Blood Culture adequate volume   Culture   Final    NO GROWTH 4 DAYS Performed at [facility], [address] [place], [place] [address]    Report Status PENDING  Incomplete  Blood Culture (routine x 2)     Status: None (Preliminary result)   Collection Time: 10/08/23  5:00 PM   Specimen: BLOOD RIGHT HAND  Result Value Ref Range Status   Specimen Description BLOOD  RIGHT HAND  Final   Special Requests   Final    BOTTLES DRAWN AEROBIC AND ANAEROBIC Blood Culture adequate volume   Culture   Final    NO GROWTH 4 DAYS Performed at [facility], [address] [place], [place] [address]    Report Status PENDING  Incomplete  C Difficile Quick Screen w PCR reflex     Status: None   Collection Time: 10/08/23  5:23 PM   Specimen: STOOL  Result Value Ref Range Status   C Diff antigen NEGATIVE NEGATIVE Final   C Diff toxin NEGATIVE NEGATIVE Final   C Diff interpretation No C. difficile detected.  Final    Comment: Performed at [facility], [address], [place], [place] [address]  Resp panel by RT-PCR (RSV, Flu A&B, Covid) Anterior Nasal Swab     Status: None   Collection Time: 10/08/23  5:33 PM  Specimen: Anterior Nasal Swab  Result Value Ref Range Status   SARS Coronavirus 2 by RT PCR NEGATIVE NEGATIVE Final   Influenza A by PCR NEGATIVE NEGATIVE Final   Influenza B by PCR NEGATIVE NEGATIVE Final    Comment: (NOTE) The Xpert Xpress SARS-CoV-2/FLU/RSV plus assay is intended as an aid in the diagnosis of influenza from Nasopharyngeal swab specimens and should not be used as a sole basis for treatment. Nasal washings and aspirates are unacceptable for Xpert Xpress SARS-CoV-2/FLU/RSV testing.  Fact Sheet for Patients: BloggerCourse.com  Fact Sheet for Healthcare Providers: SeriousBroker.it  This test is not yet approved or cleared by the [place]  FDA and has been authorized for detection and/or diagnosis of SARS-CoV-2 by FDA under an Emergency Use Authorization (EUA). This EUA will remain in effect (meaning this test can be used) for the duration of the COVID-19 declaration under Section 564(b)(1) of the Act, 21 U.S.C. section 360bbb-3(b)(1), unless the authorization is terminated or revoked.     Resp Syncytial Virus by PCR NEGATIVE NEGATIVE Final    Comment: (NOTE) Fact  Sheet for Patients: BloggerCourse.com  Fact Sheet for Healthcare Providers: SeriousBroker.it  This test is not yet approved or cleared by the [place]  FDA and has been authorized for detection and/or diagnosis of SARS-CoV-2 by FDA under an Emergency Use Authorization (EUA). This EUA will remain in effect (meaning this test can be used) for the duration of the COVID-19 declaration under Section 564(b)(1) of the Act, 21 U.S.C. section 360bbb-3(b)(1), unless the authorization is terminated or revoked.  Performed at [facility] Lab, [address] [place], [place] [address]   MRSA Next Gen by PCR, Nasal     Status: None   Collection Time: 10/09/23  5:22 PM   Specimen: STOOL; Nasal Swab  Result Value Ref Range Status   MRSA by PCR Next Gen NOT DETECTED NOT DETECTED Final    Comment: (NOTE) The GeneXpert MRSA Assay (FDA approved for NASAL specimens only), is one component of a comprehensive MRSA colonization surveillance program. It is not intended to diagnose MRSA infection nor to guide or monitor treatment for MRSA infections. Test performance is not FDA approved in patients less than 33 years old. Performed at [facility] Lab, [address] [place], [place] [address]     Labs: Results for orders placed or performed during the hospital encounter of 10/08/23 (from the past 48 hours)  Glucose, capillary     Status: Abnormal   Collection Time: 10/10/23  1:59 PM  Result Value Ref Range   Glucose-Capillary 125 (H) 70 - 99 mg/dL    Comment: Glucose reference range applies only to samples taken after fasting for at least 8 hours.  I-STAT 7, (LYTES, BLD GAS, ICA, H+H)     Status: Abnormal   Collection Time: 10/10/23  3:24 PM  Result Value Ref Range   pH, Arterial 7.198 (LL) 7.35 - 7.45   pCO2 arterial 58.1 (H) 32 - 48 mmHg   pO2, Arterial 226 (H) 83 - 108 mmHg   Bicarbonate 22.6 20.0 - 28.0 mmol/L   TCO2 24 22 - 32  mmol/L   O2 Saturation 100 %   Acid-base deficit 6.0 (H) 0.0 - 2.0 mmol/L   Sodium 134 (L) 135 - 145 mmol/L   Potassium 3.8 3.5 - 5.1 mmol/L   Calcium , Ion 1.10 (L) 1.15 - 1.40 mmol/L   HCT 30.0 (L) 36.0 - 46.0 %   Hemoglobin 10.2 (L) 12.0 - 15.0 g/dL  Patient temperature 37.1 C    Collection site art line    Drawn by RT    Sample type ARTERIAL   Glucose, capillary     Status: Abnormal   Collection Time: 10/10/23  3:28 PM  Result Value Ref Range   Glucose-Capillary 133 (H) 70 - 99 mg/dL    Comment: Glucose reference range applies only to samples taken after fasting for at least 8 hours.  Glucose, capillary     Status: Abnormal   Collection Time: 10/10/23  7:41 PM  Result Value Ref Range   Glucose-Capillary 156 (H) 70 - 99 mg/dL    Comment: Glucose reference range applies only to samples taken after fasting for at least 8 hours.  Glucose, capillary     Status: Abnormal   Collection Time: 10/10/23 11:18 PM  Result Value Ref Range   Glucose-Capillary 151 (H) 70 - 99 mg/dL    Comment: Glucose reference range applies only to samples taken after fasting for at least 8 hours.  Glucose, capillary     Status: Abnormal   Collection Time: 10/11/23  3:11 AM  Result Value Ref Range   Glucose-Capillary 138 (H) 70 - 99 mg/dL    Comment: Glucose reference range applies only to samples taken after fasting for at least 8 hours.  CBC     Status: Abnormal   Collection Time: 10/11/23  4:08 AM  Result Value Ref Range   WBC 9.8 4.0 - 10.5 K/uL   RBC 2.85 (L) 3.87 - 5.11 MIL/uL   Hemoglobin 8.9 (L) 12.0 - 15.0 g/dL   HCT 16.1 (L) 09.6 - 04.5 %   MCV 95.8 80.0 - 100.0 fL   MCH 31.2 26.0 - 34.0 pg   MCHC 32.6 30.0 - 36.0 g/dL   RDW 40.9 81.1 - 91.4 %   Platelets 195 150 - 400 K/uL   nRBC 0.0 0.0 - 0.2 %    Comment: Performed at [facility] Lab, [address] [place], [place] [address]  Basic metabolic panel with GFR     Status: Abnormal   Collection Time: 10/11/23  4:08 AM  Result Value  Ref Range   Sodium 131 (L) 135 - 145 mmol/L   Potassium 4.5 3.5 - 5.1 mmol/L   Chloride 101 98 - 111 mmol/L   CO2 23 22 - 32 mmol/L   Glucose, Bld 123 (H) 70 - 99 mg/dL    Comment: Glucose reference range applies only to samples taken after fasting for at least 8 hours.   BUN 8 6 - 20 mg/dL   Creatinine, Ser 6.21 0.44 - 1.00 mg/dL   Calcium  7.5 (L) 8.9 - 10.3 mg/dL   GFR, Estimated >30 >86 mL/min    Comment: (NOTE) Calculated using the CKD-EPI Creatinine Equation (2021)    Anion gap 7 5 - 15    Comment: Performed at [facility] Lab, [address] [place], [place] [address]  Glucose, capillary     Status: Abnormal   Collection Time: 10/11/23  7:41 AM  Result Value Ref Range   Glucose-Capillary 138 (H) 70 - 99 mg/dL    Comment: Glucose reference range applies only to samples taken after fasting for at least 8 hours.  Glucose, capillary     Status: Abnormal   Collection Time: 10/11/23 11:32 AM  Result Value Ref Range   Glucose-Capillary 121 (H) 70 - 99 mg/dL    Comment: Glucose reference range applies only to samples taken after fasting for at least  8 hours.  Glucose, capillary     Status: Abnormal   Collection Time: 10/11/23  9:51 PM  Result Value Ref Range   Glucose-Capillary 100 (H) 70 - 99 mg/dL    Comment: Glucose reference range applies only to samples taken after fasting for at least 8 hours.  Glucose, capillary     Status: Abnormal   Collection Time: 10/11/23 11:42 PM  Result Value Ref Range   Glucose-Capillary 115 (H) 70 - 99 mg/dL    Comment: Glucose reference range applies only to samples taken after fasting for at least 8 hours.  Glucose, capillary     Status: None   Collection Time: 10/12/23  3:53 AM  Result Value Ref Range   Glucose-Capillary 94 70 - 99 mg/dL    Comment: Glucose reference range applies only to samples taken after fasting for at least 8 hours.  Glucose, capillary     Status: None   Collection Time: 10/12/23  7:47 AM  Result Value Ref Range    Glucose-Capillary 85 70 - 99 mg/dL    Comment: Glucose reference range applies only to samples taken after fasting for at least 8 hours.  CBC     Status: Abnormal   Collection Time: 10/12/23  8:24 AM  Result Value Ref Range   WBC 11.0 (H) 4.0 - 10.5 K/uL   RBC 2.79 (L) 3.87 - 5.11 MIL/uL   Hemoglobin 9.0 (L) 12.0 - 15.0 g/dL   HCT 29.5 (L) 62.1 - 30.8 %   MCV 95.0 80.0 - 100.0 fL   MCH 32.3 26.0 - 34.0 pg   MCHC 34.0 30.0 - 36.0 g/dL   RDW 65.7 84.6 - 96.2 %   Platelets 217 150 - 400 K/uL   nRBC 0.0 0.0 - 0.2 %    Comment: Performed at [facility], [address], [place], [place] [address]    Imaging / Studies: No results found.  Medications / Allergies: per chart  Antibiotics: Anti-infectives (From admission, onward)    Start     Dose/Rate Route Frequency Ordered Stop   10/09/23 0800  metroNIDAZOLE  (FLAGYL ) IVPB 500 mg        500 mg 100 mL/hr over 60 Minutes Intravenous Every 12 hours 10/08/23 2220 10/15/23 2359   10/09/23 0000  cefTRIAXone  (ROCEPHIN ) 2 g in sodium chloride  0.9 % 100 mL IVPB        2 g 200 mL/hr over 30 Minutes Intravenous Every 24 hours 10/08/23 2220 10/15/23 2359   10/08/23 1730  aztreonam  (AZACTAM ) 2 g in sodium chloride  0.9 % 100 mL IVPB        2 g 200 mL/hr over 30 Minutes Intravenous  Once 10/08/23 1724 10/08/23 1812   10/08/23 1730  metroNIDAZOLE  (FLAGYL ) IVPB 500 mg        500 mg 100 mL/hr over 60 Minutes Intravenous  Once 10/08/23 1724 10/08/23 1854   10/08/23 1730  vancomycin  (VANCOCIN ) IVPB 1000 mg/200 mL premix        1,000 mg 200 mL/hr over 60 Minutes Intravenous  Once 10/08/23 1724 10/08/23 1954         Note: Portions of this report may have been transcribed using voice recognition software. Every effort was made to ensure accuracy; however, inadvertent computerized transcription errors may be present.   Any transcriptional errors that result from this process are unintentional.    Eddye Goodie, MD, FACS,  MASCRS Esophageal, Gastrointestinal & Colorectal Surgery Robotic and Minimally Invasive Surgery  [facility]  Surgery [facility] [address], [address] [place], [place] [address] [phone] Fax [phone] Main  CONTACT INFORMATION: Weekday (9AM-5PM): Call [facility] main office at [phone] Weeknight (5PM-9AM) or Weekend/Holiday: Check EPIC "Web Links" tab & use "AMION" (password " [email or web address]") for General Surgery [facility] coverage  Please, DO NOT use SecureChat  (it is not reliable communication to reach operating surgeons & will lead to a delay in care).   Epic staff messaging available for outptient concerns needing 1-2 business day response.      10/12/2023  9:38 AM

## 2023-10-12 NOTE — Consult Note (Signed)
 WOC team consulted for new colostomy.  WOC team currently following, initial education and assessment 10/11/2023.  Plans for further education Monday 10/14/2023.   Thank you,    Ronni Colace MSN, RN-BC, [employer] [phone]

## 2023-10-12 NOTE — Plan of Care (Signed)

## 2023-10-13 DIAGNOSIS — K5720 Diverticulitis of large intestine with perforation and abscess without bleeding: Secondary | ICD-10-CM

## 2023-10-13 LAB — CULTURE, BLOOD (ROUTINE X 2)
Culture: NO GROWTH
Culture: NO GROWTH
Special Requests: ADEQUATE
Special Requests: ADEQUATE

## 2023-10-13 LAB — CBC
HCT: 29.9 % — ABNORMAL LOW (ref 36.0–46.0)
Hemoglobin: 9.9 g/dL — ABNORMAL LOW (ref 12.0–15.0)
MCH: 31.4 pg (ref 26.0–34.0)
MCHC: 33.1 g/dL (ref 30.0–36.0)
MCV: 94.9 fL (ref 80.0–100.0)
Platelets: 216 10*3/uL (ref 150–400)
RBC: 3.15 MIL/uL — ABNORMAL LOW (ref 3.87–5.11)
RDW: 13.3 % (ref 11.5–15.5)
WBC: 7.9 10*3/uL (ref 4.0–10.5)
nRBC: 0 % (ref 0.0–0.2)

## 2023-10-13 LAB — GLUCOSE, CAPILLARY
Glucose-Capillary: 88 mg/dL (ref 70–99)
Glucose-Capillary: 81 mg/dL (ref 70–99)
Glucose-Capillary: 83 mg/dL (ref 70–99)
Glucose-Capillary: 136 mg/dL — ABNORMAL HIGH (ref 70–99)
Glucose-Capillary: 103 mg/dL — ABNORMAL HIGH (ref 70–99)
Glucose-Capillary: 99 mg/dL (ref 70–99)
Glucose-Capillary: 107 mg/dL — ABNORMAL HIGH (ref 70–99)

## 2023-10-13 LAB — BASIC METABOLIC PANEL WITH GFR
Anion gap: 11 (ref 5–15)
BUN: 10 mg/dL (ref 6–20)
CO2: 24 mmol/L (ref 22–32)
Calcium: 8 mg/dL — ABNORMAL LOW (ref 8.9–10.3)
Chloride: 98 mmol/L (ref 98–111)
Creatinine, Ser: 0.56 mg/dL (ref 0.44–1.00)
GFR, Estimated: >60 mL/min (ref >60)
Glucose, Bld: 84 mg/dL (ref 70–99)
Potassium: 3.7 mmol/L (ref 3.5–5.1)
Sodium: 133 mmol/L — ABNORMAL LOW (ref 135–145)

## 2023-10-13 NOTE — Progress Notes (Signed)
 PROGRESS NOTE        PATIENT DETAILS Name: Vicki [name] Age: 58 y.o. Sex: female Date of Birth: Nov 15, 1965 Admit Date: 10/08/2023 Admitting Physician Lena Qualia, MD ZOX:[id number], Schuyler Custard, FNP  Brief Summary: Patient is a 58 y.o.  female with history of COPD on 4 L of oxygen at home-presented with abdominal pain secondary to sigmoid diverticulosis, hospital course complicated by development of septic shock requiring pressors.  She subsequently developed peritonitis and was taken to the operating room where she was found to have perforated diverticulitis with purulent peritonitis and underwent Hartman's procedure.    Significant events: 5/27>> admit to [facility]-severe sepsis secondary to sigmoid diverticulitis-BP soft-PCCM consulted-keep on floor. 5/28>> persistent hypotension-septic shock-transferred to ICU 5/29>> peritonitis-persistent septic shock-2 OR-perforated diverticulitis with purulent peritonitis-underwent Hartman's procedure. 5/31>> transferred back to [facility]  Significant studies: 5/27>> CT abdomen/pelvis: Severe sigmoid diverticulitis without associated abscess/perforation.  Significant microbiology data: 5/27>> COVID/influenza/RSV PCR: Negative 5/27>> stool C. difficile studies: Negative 5/27>> blood cultures: Negative  Procedures: 5/29>> Hartman's procedure  Consults: PCCM [facility]  Subjective: Patient in bed appears to be in no distress, denies any headache or chest pain, mild dry cough, no shortness of breath, still having some abdominal pain postop, no nausea, currently not passing any flatus or no stool in her ostomy bag.  Objective: Vitals: Blood pressure 99/61, pulse 90, temperature 98.6 F (37 C), temperature source Axillary, resp. rate 20, height 5\' 3"  (1.6 m), weight 78.7 kg, SpO2 97%.   Exam:  Awake Alert, No new F.N deficits, Normal affect [place].AT,PERRAL Supple Neck, No JVD,   Symmetrical Chest wall movement, Good air  movement bilaterally, CTAB RRR,No Gallops, Rubs or new Murmurs,  +ve B.Sounds, Abd Soft, colostomy bag in place. No Cyanosis, Clubbing or edema    Assessment/Plan:  Septic shock secondary to sigmoid diverticulitis with perforation resulting in peritonitis-now s/p Hartman's procedure 5/29. Sepsis physiology has resolved Required pressors while in the ICU Remains on Rocephin /Flagyl  General Surgery following and directing postop care.  Acute on chronic hypoxic/hypercapnic respiratory failure (on 4 L of oxygen at home) Worsening hypoxia-secondary to sepsis/pain-causing splitting-background of COPD Improving on 4 L nasal cannula oxygen, encouraged to sit in chair use I-S and flutter valve.  On home oxygen as well.  Normocytic anemia Secondary to critical illness Follow CBC and transfuse if significant drop  COPD Stable Continue bronchodilators  HTN BP stable without the use of any antihypertensives Resume amlodipine  when able  Anxiety/depression Resume Celexa  when oral intake is more stable.  GERD PPI  EtOH abuse No signs of alcohol withdrawal Continue to monitor closely  Tobacco abuse Counseled  Class 1 Obesity: Estimated body mass index is 30.73 kg/m as calculated from the following:   Height as of this encounter: 5\' 3"  (1.6 m).   Weight as of this encounter: 78.7 kg.   Code status:   Code Status: Full Code   DVT Prophylaxis: enoxaparin  (LOVENOX ) injection 40 mg Start: 10/11/23 1000 SCDs Start: 10/09/23 1722    Family Communication: None at bedside   Disposition Plan: Status is: Inpatient Remains inpatient appropriate because: Severity of illness   Planned Discharge Destination:Home health   Diet: Diet Order             Diet clear liquid Room service appropriate? Yes; Fluid consistency: Thin  Diet effective now  MEDICATIONS: Scheduled Meds:  acetaminophen   1,000 mg Oral Q6H   budesonide -glycopyrrolate -formoterol   2 puff  Inhalation BID   enoxaparin  (LOVENOX ) injection  40 mg Subcutaneous Q24H   guaiFENesin   600 mg Oral BID   ipratropium-albuterol   3 mL Nebulization BID   ipratropium-albuterol        methocarbamol   1,000 mg Oral TID   pantoprazole   40 mg Oral BID   sodium chloride  flush  3 mL Intravenous Q12H   thiamine   100 mg Oral Daily   Continuous Infusions:  ceFEPime  (MAXIPIME ) IV 2 g (10/13/23 0628)   metronidazole  500 mg (10/12/23 1958)   PRN Meds:.albuterol , ALPRAZolam , azelastine , HYDROmorphone  (DILAUDID ) injection, ipratropium-albuterol , melatonin, ondansetron  **OR** ondansetron  (ZOFRAN ) IV, oxyCODONE , prochlorperazine , SUMAtriptan    I have personally reviewed following labs and imaging studies   Data Review:   DVT Prophylaxis  enoxaparin  (LOVENOX ) injection 40 mg Start: 10/11/23 1000 SCDs Start: 10/09/23 1722  Recent Labs  Lab 10/08/23 1633 10/09/23 0442 10/09/23 1907 10/10/23 0402 10/10/23 1524 10/11/23 0408 10/12/23 0824  WBC 11.3* 9.8 4.5 17.1*  --  9.8 11.0*  HGB 13.0 10.4* 10.0* 9.9* 10.2* 8.9* 9.0*  HCT 39.6 32.1* 30.8* 30.1* 30.0* 27.3* 26.5*  PLT 213 161 129* 200  --  195 217  MCV 95.2 96.4 97.8 96.8  --  95.8 95.0  MCH 31.3 31.2 31.7 31.8  --  31.2 32.3  MCHC 32.8 32.4 32.5 32.9  --  32.6 34.0  RDW 13.1 13.1 13.1 13.2  --  13.1 13.2  LYMPHSABS 0.9  --  0.3* 0.7  --   --   --   MONOABS 0.9  --  0.3 0.5  --   --   --   EOSABS 0.0  --  0.1 0.0  --   --   --   BASOSABS 0.0  --  0.0 0.2*  --   --   --     Recent Labs  Lab 10/08/23 1633 10/08/23 1706 10/08/23 1900 10/09/23 0030 10/09/23 0438 10/09/23 0442 10/09/23 1907 10/09/23 2108 10/10/23 0019 10/10/23 0402 10/10/23 1524 10/11/23 0408 10/12/23 0824  NA 138  --   --   --   --  137 135  --   --  133* 134* 131* 134*  K 3.2*  --   --   --   --  2.9* 3.3*  --   --  3.6 3.8 4.5 3.7  CL 95*  --   --   --   --  99 100  --   --  101  --  101 99  CO2 31  --   --   --   --  32 16*  --   --  23  --  23 26   ANIONGAP 12  --   --   --   --  6 19*  --   --  9  --  7 9  GLUCOSE 133*  --   --   --   --  108* 94  --   --  108*  --  123* 74  BUN <5*  --   --   --   --  <5* 5*  --   --  <5*  --  8 15  CREATININE 0.70  --   --   --   --  0.74 0.83  --   --  0.79  --  0.59 0.64  AST 61*  --   --   --   --  45* 41  --   --  34  --   --   --   ALT 16  --   --   --   --  13 11  --   --  12  --   --   --   ALKPHOS 121  --   --   --   --  78 74  --   --  55  --   --   --   BILITOT 1.2  --   --   --   --  1.0 1.4*  --   --  1.2  --   --   --   ALBUMIN  3.4*  --   --   --   --  2.4* 2.5*  --   --  2.5*  --   --   --   PROCALCITON  --   --   --   --   --   --   --  3.64  --   --   --   --   --   LATICACIDVEN  --    < > 8.7* 2.0* 1.6  --   --  5.2* 4.5*  --   --   --   --   INR 1.0  --   --   --   --   --   --   --   --   --   --   --   --   MG  --   --   --   --   --  0.7*  --  2.5*  --   --   --   --   --   PHOS  --   --   --   --   --  2.2* 2.6  --   --  2.5  --   --   --   CALCIUM  7.9*  --   --   --   --  7.8* 7.4*  --   --  7.5*  --  7.5* 7.7*   < > = values in this interval not displayed.     Recent Labs  Lab 10/08/23 1633 10/08/23 1706 10/08/23 1900 10/09/23 0030 10/09/23 9147 10/09/23 0442 10/09/23 1907 10/09/23 2108 10/10/23 0019 10/10/23 0402 10/11/23 0408 10/12/23 0824  PROCALCITON  --   --   --   --   --   --   --  3.64  --   --   --   --   LATICACIDVEN  --    < > 8.7* 2.0* 1.6  --   --  5.2* 4.5*  --   --   --   INR 1.0  --   --   --   --   --   --   --   --   --   --   --   MG  --   --   --   --   --  0.7*  --  2.5*  --   --   --   --   CALCIUM  7.9*  --   --   --   --  7.8* 7.4*  --   --  7.5* 7.5* 7.7*   < > = values in this interval not displayed.    Radiology Reports  No results found.    Signature  -   Lynnwood Sauer M.D on 10/13/2023 at 8:15  AM   -  To page go to [email or web address]

## 2023-10-13 NOTE — Progress Notes (Signed)
 10/13/2023  Vicki [name] [id number] [birth year]  CARE TEAM: PCP: Jenelle Mis, FNP  Outpatient Care Team: Patient Care Team: Jenelle Mis, FNP as PCP - General (Family Medicine) Margaretann Sharper, MD as Consulting Physician (Pulmonary Disease) [facility], [facility]  Inpatient Treatment Team: Treatment Team:  Cala Castleman, MD [facility], Md, MD Jenifer Miu, Velta Gianotti, RN Trevor Fudge, MD Tyrone Gallop, MD Kalman Ores, NT Adaline Ada, [place] Waunita Haff, RN Kathaleen Pale, RN   Problem List:   Principal Problem:   Diverticulitis with perforation and abscess s/p Hartmann/colostomy 10/10/2023 Active Problems:   COPD, severe (HCC)   Insomnia   Hyperlipidemia   Tobacco abuse   Chronic respiratory failure with hypoxia (HCC)   Essential hypertension   Septic shock (HCC)   GERD (gastroesophageal reflux disease)   Anxiety and depression   Sigmoid diverticulitis   Hypokalemia   Elevated AST (SGOT)   Alcohol abuse   10/10/2023  PRE-OPERATIVE DIAGNOSIS:  Perforated diverticulitis   POST-OPERATIVE DIAGNOSIS:  Perforated diverticulitis with purulent peritonitis   PROCEDURE:  Exploratory laparotomy with sigmoid colectomy, end colostomy (Hartmann's procedure)   SURGEON:  Renford Cartwright. White, MD  FINDINGS: Purulent peritonitis with sigmoid diverticulitis. Hartmann's procedure carried out otherwise uneventfully   Assessment [facility] Stay = 5 days) 3 Days Post-Op    Okay    Assessment/Plan Septic shock secondary to Sigmoid Diverticulitis with purulent ascites and peritonitis   POD3 s/p exploratory laparotomy, sigmoid colectomy, end colostomy 10/10/2023 Dr. Camilo Cella - Okay to clear liquid diet as tolerated.  Once has evidence of flatus/stool in colostomy, gradually advance diet.   - Nausea control. - WOC following for new colostomy  - Wound appears to be cleaning up.  See if we can start wound VAC   - adjusted meds for pain control  -scheduled Tylenol  to start with extra for breakthrough - Foley d/c 5/31 - continue Abx through POD5.  With increasing leukocytosis and some question of ileus switch to cefepime /metronidazole  for more appropriate coverage given complex severe diverticulitis.  Low threshold to do CAT scan if not better by postop day 5  - mobilize as tolerated.  Challenging this oxygen dependent COPD patient.  Supplemental oxygen with mobilization.  Diuresis as tolerated, IS/flutter valve    FEN: ice chips/sips with meds, IVF per CCM VTE: LMWH  ID: rocephin /flagyl  -5/31.  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  Questions were answered.  She expressed understanding & appreciation.  -Disposition: To be determined.  Still many days to go t.      I reviewed nursing notes, hospitalist notes, last 24 h vitals and pain scores, last 48 h intake and output, last 24 h labs and trends, and last 24 h imaging results.  I have reviewed this patient's available data, including medical history, events of note, test results, etc as part of my evaluation.   A significant portion of that time was spent in counseling. Care during the described time interval was provided by me.  This care required moderate level of medical decision making.  10/13/2023    Subjective: (Chief complaint)  Patient feeling little stronger.  Mild nausea.  Walking in room only.  Nurse in room.  Objective:  Vital signs:  Vitals:   10/12/23 2001 10/12/23 2212 10/13/23 0636 10/13/23 0817  BP: 121/88 99/61  (!) 123/91  Pulse: 88 90  84  Resp: 18 20  14   Temp: 98.3 F (36.8 C) 98.5 F (36.9 C) 98.6 F  (37 C) 98 F (36.7 C)  TempSrc: Oral Oral Axillary Oral  SpO2: 95% 97%  98%  Weight:      Height:        Last BM Date : 10/10/23  Intake/Output   Yesterday:  05/31 0701 - 06/01 0700 In: -  Out: 2800 [Urine:2800] This shift:  No intake/output data recorded.  Bowel function:  Flatus: No  BM:  No  Drain: (No drain)   Physical Exam:  General: Pt awake/alert in no acute distress Eyes: PERRL, normal EOM.  Sclera clear.  No icterus Neuro: CN II-XII intact w/o focal sensory/motor deficits. Lymph: No head/neck/groin lymphadenopathy Psych:  No delerium/psychosis/paranoia.  Oriented x 4.  Mildly worried but consolable HENT: Normocephalic, Mucus membranes moist.  No thrush Neck: Supple, No tracheal deviation.  No obvious thyromegaly Chest: No pain to chest wall compression.  Good respiratory excursion.  No audible wheezing CV:  Pulses intact.  Regular rhythm.  No major extremity edema MS: Normal AROM mjr joints.  No obvious deformity  Abdomen: Obese  Soft.  Nondistended.  Mildly tender at incisions only.  No evidence of peritonitis.  No incarcerated hernias. Midline wound packed with good granulation and subcutaneous tissues.  No major purulence. Colostomy left upper quadrant with mild edema.  No flatus nor stool in bag   Ext:   No deformity.  No mjr edema.  No cyanosis Skin: No petechiae / purpurea.  No major sores.  Warm and dry    Results:   Cultures: Recent Results (from the past 720 hours)  Blood Culture (routine x 2)     Status: None   Collection Time: 10/08/23  4:55 PM   Specimen: BLOOD LEFT ARM  Result Value Ref Range Status   Specimen Description BLOOD LEFT ARM  Final   Special Requests   Final    BOTTLES DRAWN AEROBIC AND ANAEROBIC Blood Culture adequate volume   Culture   Final    NO GROWTH 5 DAYS Performed at [facility], [address] [place], [place] [address]    Report Status 10/13/2023 FINAL  Final  Blood Culture (routine x 2)     Status:  None   Collection Time: 10/08/23  5:00 PM   Specimen: BLOOD RIGHT HAND  Result Value Ref Range Status   Specimen Description BLOOD RIGHT HAND  Final   Special Requests   Final    BOTTLES DRAWN AEROBIC AND ANAEROBIC Blood Culture adequate volume   Culture   Final    NO GROWTH 5 DAYS Performed at [facility], [address] [place], [place] [address]    Report Status 10/13/2023 FINAL  Final  C Difficile Quick Screen w PCR reflex     Status: None   Collection Time: 10/08/23  5:23 PM   Specimen: STOOL  Result Value Ref Range Status   C Diff antigen NEGATIVE NEGATIVE Final   C Diff toxin NEGATIVE NEGATIVE Final   C Diff interpretation No C. difficile detected.  Final    Comment: Performed at [facility], [address] [place], [place] [address]  Resp panel by RT-PCR (RSV, Flu A&B, Covid) Anterior  Nasal Swab     Status: None   Collection Time: 10/08/23  5:33 PM   Specimen: Anterior Nasal Swab  Result Value Ref Range Status   SARS Coronavirus 2 by RT PCR NEGATIVE NEGATIVE Final   Influenza A by PCR NEGATIVE NEGATIVE Final   Influenza B by PCR NEGATIVE NEGATIVE Final    Comment: (NOTE) The Xpert Xpress SARS-CoV-2/FLU/RSV plus assay is intended as an aid in the diagnosis of influenza from Nasopharyngeal swab specimens and should not be used as a sole basis for treatment. Nasal washings and aspirates are unacceptable for Xpert Xpress SARS-CoV-2/FLU/RSV testing.  Fact Sheet for Patients: BloggerCourse.com  Fact Sheet for Healthcare Providers: SeriousBroker.it  This test is not yet approved or cleared by the [place]  FDA and has been authorized for detection and/or diagnosis of SARS-CoV-2 by FDA under an Emergency Use Authorization (EUA). This EUA will remain in effect (meaning this test can be used) for the duration of the COVID-19 declaration under Section 564(b)(1) of the Act, 21 U.S.C. section 360bbb-3(b)(1),  unless the authorization is terminated or revoked.     Resp Syncytial Virus by PCR NEGATIVE NEGATIVE Final    Comment: (NOTE) Fact Sheet for Patients: BloggerCourse.com  Fact Sheet for Healthcare Providers: SeriousBroker.it  This test is not yet approved or cleared by the [place]  FDA and has been authorized for detection and/or diagnosis of SARS-CoV-2 by FDA under an Emergency Use Authorization (EUA). This EUA will remain in effect (meaning this test can be used) for the duration of the COVID-19 declaration under Section 564(b)(1) of the Act, 21 U.S.C. section 360bbb-3(b)(1), unless the authorization is terminated or revoked.  Performed at [facility], [address] [place], [place] [address]   MRSA Next Gen by PCR, Nasal     Status: None   Collection Time: 10/09/23  5:22 PM   Specimen: STOOL; Nasal Swab  Result Value Ref Range Status   MRSA by PCR Next Gen NOT DETECTED NOT DETECTED Final    Comment: (NOTE) The GeneXpert MRSA Assay (FDA approved for NASAL specimens only), is one component of a comprehensive MRSA colonization surveillance program. It is not intended to diagnose MRSA infection nor to guide or monitor treatment for MRSA infections. Test performance is not FDA approved in patients less than 34 years old. Performed at [facility], [address], [place], [place] [address]     Labs: Results for orders placed or performed during the hospital encounter of 10/08/23 (from the past 48 hours)  Glucose, capillary     Status: Abnormal   Collection Time: 10/11/23 11:32 AM  Result Value Ref Range   Glucose-Capillary 121 (H) 70 - 99 mg/dL    Comment: Glucose reference range applies only to samples taken after fasting for at least 8 hours.  Glucose, capillary     Status: Abnormal   Collection Time: 10/11/23  9:51 PM  Result Value Ref Range   Glucose-Capillary 100 (H) 70 - 99 mg/dL    Comment:  Glucose reference range applies only to samples taken after fasting for at least 8 hours.  Glucose, capillary     Status: Abnormal   Collection Time: 10/11/23 11:42 PM  Result Value Ref Range   Glucose-Capillary 115 (H) 70 - 99 mg/dL    Comment: Glucose reference range applies only to samples taken after fasting for at least 8 hours.  Glucose, capillary     Status: None   Collection Time: 10/12/23  3:53 AM  Result Value Ref Range   Glucose-Capillary 94 70 - 99 mg/dL    Comment: Glucose reference range applies only to samples taken after fasting for at least 8 hours.  Glucose, capillary     Status: None   Collection Time: 10/12/23  7:47 AM  Result Value Ref Range   Glucose-Capillary 85 70 - 99 mg/dL    Comment: Glucose reference range applies only to samples taken after fasting for at least 8 hours.  CBC     Status: Abnormal   Collection Time: 10/12/23  8:24 AM  Result Value Ref Range   WBC 11.0 (H) 4.0 - 10.5 K/uL   RBC 2.79 (L) 3.87 - 5.11 MIL/uL   Hemoglobin 9.0 (L) 12.0 - 15.0 g/dL   HCT 11.9 (L) 14.7 - 82.9 %   MCV 95.0 80.0 - 100.0 fL   MCH 32.3 26.0 - 34.0 pg   MCHC 34.0 30.0 - 36.0 g/dL   RDW 56.2 13.0 - 86.5 %   Platelets 217 150 - 400 K/uL   nRBC 0.0 0.0 - 0.2 %    Comment: Performed at [facility], [address], [place], [place] [address]  Basic metabolic panel with GFR     Status: Abnormal   Collection Time: 10/12/23  8:24 AM  Result Value Ref Range   Sodium 134 (L) 135 - 145 mmol/L   Potassium 3.7 3.5 - 5.1 mmol/L   Chloride 99 98 - 111 mmol/L   CO2 26 22 - 32 mmol/L   Glucose, Bld 74 70 - 99 mg/dL    Comment: Glucose reference range applies only to samples taken after fasting for at least 8 hours.   BUN 15 6 - 20 mg/dL   Creatinine, Ser 6.29 0.44 - 1.00 mg/dL   Calcium  7.7 (L) 8.9 - 10.3 mg/dL   GFR, Estimated >52 >84 mL/min    Comment: (NOTE) Calculated using the CKD-EPI Creatinine Equation (2021)    Anion gap 9 5 - 15    Comment: Performed at  [facility], [address] [place], [place] [address]  Glucose, capillary     Status: None   Collection Time: 10/12/23 12:10 PM  Result Value Ref Range   Glucose-Capillary 71 70 - 99 mg/dL    Comment: Glucose reference range applies only to samples taken after fasting for at least 8 hours.  Glucose, capillary     Status: None   Collection Time: 10/12/23  4:50 PM  Result Value Ref Range   Glucose-Capillary 93 70 - 99 mg/dL    Comment: Glucose reference range applies only to samples taken after fasting for at least 8 hours.  Glucose, capillary     Status: None   Collection Time: 10/12/23  7:59 PM  Result Value Ref Range   Glucose-Capillary 86 70 - 99 mg/dL    Comment: Glucose reference range applies only to samples taken after fasting for at least 8 hours.  Glucose, capillary     Status: Abnormal   Collection Time: 10/12/23 10:06 PM  Result Value Ref Range   Glucose-Capillary 116 (H) 70 - 99 mg/dL    Comment: Glucose reference range applies only to samples taken after fasting for at least 8 hours.  Glucose, capillary     Status: None   Collection Time: 10/13/23  6:33 AM  Result Value Ref Range   Glucose-Capillary 88 70 - 99 mg/dL    Comment: Glucose reference range applies only to samples taken after fasting for  at least 8 hours.  Glucose, capillary     Status: None   Collection Time: 10/13/23  8:24 AM  Result Value Ref Range   Glucose-Capillary 81 70 - 99 mg/dL    Comment: Glucose reference range applies only to samples taken after fasting for at least 8 hours.    Imaging / Studies: No results found.  Medications / Allergies: per chart  Antibiotics: Anti-infectives (From admission, onward)    Start     Dose/Rate Route Frequency Ordered Stop   10/12/23 1400  ceFEPIme  (MAXIPIME ) 2 g in sodium chloride  0.9 % 100 mL IVPB       Note to Pharmacy: Pharmacy may adjust dosing strength, schedule, rate of infusion, etc as needed to optimize therapy   2 g 200 mL/hr over 30  Minutes Intravenous Every 8 hours 10/12/23 1036 10/17/23 1359   10/09/23 0800  metroNIDAZOLE  (FLAGYL ) IVPB 500 mg        500 mg 100 mL/hr over 60 Minutes Intravenous Every 12 hours 10/08/23 2220 10/15/23 2359   10/09/23 0000  cefTRIAXone  (ROCEPHIN ) 2 g in sodium chloride  0.9 % 100 mL IVPB  Status:  Discontinued        2 g 200 mL/hr over 30 Minutes Intravenous Every 24 hours 10/08/23 2220 10/12/23 1036   10/08/23 1730  aztreonam  (AZACTAM ) 2 g in sodium chloride  0.9 % 100 mL IVPB        2 g 200 mL/hr over 30 Minutes Intravenous  Once 10/08/23 1724 10/08/23 1812   10/08/23 1730  metroNIDAZOLE  (FLAGYL ) IVPB 500 mg        500 mg 100 mL/hr over 60 Minutes Intravenous  Once 10/08/23 1724 10/08/23 1854   10/08/23 1730  vancomycin  (VANCOCIN ) IVPB 1000 mg/200 mL premix        1,000 mg 200 mL/hr over 60 Minutes Intravenous  Once 10/08/23 1724 10/08/23 1954         Note: Portions of this report may have been transcribed using voice recognition software. Every effort was made to ensure accuracy; however, inadvertent computerized transcription errors may be present.   Any transcriptional errors that result from this process are unintentional.    Eddye Goodie, MD, FACS, MASCRS Esophageal, Gastrointestinal & Colorectal Surgery Robotic and Minimally Invasive Surgery  [facility] [address] [place], [place] [address] [phone] Fax [phone] Main  CONTACT INFORMATION: Weekday (9AM-5PM): Call [facility] main office at [phone] Weeknight (5PM-9AM) or Weekend/Holiday: Check EPIC "Web Links" tab & use "AMION" (password " [email or web address]") for General Surgery [facility] coverage  Please, DO NOT use SecureChat  (it is not reliable communication to reach operating surgeons & will lead to a delay in care).   Epic staff messaging available for outptient concerns needing 1-2 business day response.      10/13/2023  8:36 AM

## 2023-10-13 NOTE — Plan of Care (Signed)
  Problem: Education: Goal: Knowledge of General Education information will improve Description: Including pain rating scale, medication(s)/side effects and non-pharmacologic comfort measures Outcome: Progressing   Problem: Health Behavior/Discharge Planning: Goal: Ability to manage health-related needs will improve Outcome: Progressing   Problem: Clinical Measurements: Goal: Will remain free from infection Outcome: Progressing Goal: Diagnostic test results will improve Outcome: Progressing   Problem: Activity: Goal: Risk for activity intolerance will decrease Outcome: Progressing   Problem: Nutrition: Goal: Adequate nutrition will be maintained Outcome: Progressing   Problem: Coping: Goal: Level of anxiety will decrease Outcome: Progressing

## 2023-10-14 ENCOUNTER — Inpatient Hospital Stay (HOSPITAL_COMMUNITY)

## 2023-10-14 DIAGNOSIS — K5720 Diverticulitis of large intestine with perforation and abscess without bleeding: Secondary | ICD-10-CM | POA: Diagnosis not present

## 2023-10-14 LAB — BASIC METABOLIC PANEL WITH GFR
Anion gap: 10 (ref 5–15)
BUN: 6 mg/dL (ref 6–20)
CO2: 25 mmol/L (ref 22–32)
Calcium: 7.7 mg/dL — ABNORMAL LOW (ref 8.9–10.3)
Chloride: 97 mmol/L — ABNORMAL LOW (ref 98–111)
Creatinine, Ser: 0.55 mg/dL (ref 0.44–1.00)
GFR, Estimated: >60 mL/min (ref >60)
Glucose, Bld: 96 mg/dL (ref 70–99)
Potassium: 3.7 mmol/L (ref 3.5–5.1)
Sodium: 132 mmol/L — ABNORMAL LOW (ref 135–145)

## 2023-10-14 LAB — PHOSPHORUS: Phosphorus: 2.8 mg/dL (ref 2.5–4.6)

## 2023-10-14 LAB — CBC WITH DIFFERENTIAL/PLATELET
Abs Immature Granulocytes: 0.36 10*3/uL — ABNORMAL HIGH (ref 0.00–0.07)
Basophils Absolute: 0 10*3/uL (ref 0.0–0.1)
Basophils Relative: 0 %
Eosinophils Absolute: 0.4 10*3/uL (ref 0.0–0.5)
Eosinophils Relative: 4 %
HCT: 29.3 % — ABNORMAL LOW (ref 36.0–46.0)
Hemoglobin: 9.9 g/dL — ABNORMAL LOW (ref 12.0–15.0)
Immature Granulocytes: 4 %
Lymphocytes Relative: 12 %
Lymphs Abs: 1.2 10*3/uL (ref 0.7–4.0)
MCH: 32 pg (ref 26.0–34.0)
MCHC: 33.8 g/dL (ref 30.0–36.0)
MCV: 94.8 fL (ref 80.0–100.0)
Monocytes Absolute: 1.6 10*3/uL — ABNORMAL HIGH (ref 0.1–1.0)
Monocytes Relative: 16 %
Neutro Abs: 6.6 10*3/uL (ref 1.7–7.7)
Neutrophils Relative %: 64 %
Platelets: 231 10*3/uL (ref 150–400)
RBC: 3.09 MIL/uL — ABNORMAL LOW (ref 3.87–5.11)
RDW: 13.2 % (ref 11.5–15.5)
WBC: 10.1 10*3/uL (ref 4.0–10.5)
nRBC: 0 % (ref 0.0–0.2)

## 2023-10-14 LAB — GLUCOSE, CAPILLARY
Glucose-Capillary: 91 mg/dL (ref 70–99)
Glucose-Capillary: 94 mg/dL (ref 70–99)
Glucose-Capillary: 94 mg/dL (ref 70–99)
Glucose-Capillary: 116 mg/dL — ABNORMAL HIGH (ref 70–99)
Glucose-Capillary: 110 mg/dL — ABNORMAL HIGH (ref 70–99)
Glucose-Capillary: 122 mg/dL — ABNORMAL HIGH (ref 70–99)

## 2023-10-14 LAB — SURGICAL PATHOLOGY

## 2023-10-14 LAB — MAGNESIUM: Magnesium: 1.2 mg/dL — ABNORMAL LOW (ref 1.7–2.4)

## 2023-10-14 MED ORDER — SMOG ENEMA
200.0000 mL | Freq: Once | RECTAL | Status: DC
  Filled 2023-10-14: qty 960

## 2023-10-14 MED ORDER — MAGNESIUM SULFATE 4 GM/100ML IV SOLN
4.0000 g | Freq: Once | INTRAVENOUS | Status: AC
  Administered 2023-10-14: 4 g via INTRAVENOUS
  Filled 2023-10-14: qty 100

## 2023-10-14 MED ORDER — POTASSIUM CHLORIDE CRYS ER 20 MEQ PO TBCR
20.0000 meq | EXTENDED_RELEASE_TABLET | Freq: Once | ORAL | Status: AC
  Administered 2023-10-14: 20 meq via ORAL
  Filled 2023-10-14: qty 1

## 2023-10-14 MED ORDER — BISACODYL 10 MG RE SUPP
10.0000 mg | Freq: Once | RECTAL | Status: AC
  Administered 2023-10-14: 10 mg via RECTAL
  Filled 2023-10-14: qty 1

## 2023-10-14 NOTE — Progress Notes (Signed)
 Progress Note  4 Days Post-Op  Subjective: Pt reports cramping gas pains - passing flatus via stoma but no stool output yet. She has some nausea occasionally but no vomiting. Has not been out of bed.   Objective: Vital signs in last 24 hours: Temp:  [97.3 F (36.3 C)-98.6 F (37 C)] 98.6 F (37 C) (06/02 0800) Pulse Rate:  [82-98] 91 (06/02 0800) Resp:  [13-19] 19 (06/02 0800) BP: (98-130)/(67-91) 98/67 (06/02 0800) SpO2:  [93 %-98 %] 93 % (06/02 0800) Last BM Date : 10/10/23  Intake/Output from previous day: 06/01 0701 - 06/02 0700 In: -  Out: 860 [Urine:850; Stool:10] Intake/Output this shift: No intake/output data recorded.  PE: General: WD, obese female, appears uncomfortable but non-toxic  HEENT: Sclera anicteric Heart: RRR Lungs: on 4L via [place] which is baseline, normal effot  Abd: mildly distended, appropriately ttp, midline wound clean, ostomy viable with gas and sweat in bag Psych: A&Ox3 with an appropriate affect.    Lab Results:  Recent Labs    10/13/23 0833 10/14/23 0337  WBC 7.9 10.1  HGB 9.9* 9.9*  HCT 29.9* 29.3*  PLT 216 231   BMET Recent Labs    10/13/23 0833 10/14/23 0337  NA 133* 132*  K 3.7 3.7  CL 98 97*  CO2 24 25  GLUCOSE 84 96  BUN 10 6  CREATININE 0.56 0.55  CALCIUM  8.0* 7.7*   PT/INR No results for input(s): "LABPROT", "INR" in the last 72 hours.  CMP     Component Value Date/Time   NA 132 (L) 10/14/2023 0337   NA 140 10/07/2020 0925   K 3.7 10/14/2023 0337   CL 97 (L) 10/14/2023 0337   CO2 25 10/14/2023 0337   GLUCOSE 96 10/14/2023 0337   BUN 6 10/14/2023 0337   BUN 12 10/07/2020 0925   CREATININE 0.55 10/14/2023 0337   CREATININE 0.76 04/25/2023 1136   CALCIUM  7.7 (L) 10/14/2023 0337   PROT 5.0 (L) 10/10/2023 0402   PROT 7.0 10/07/2020 0925   ALBUMIN  2.5 (L) 10/10/2023 0402   ALBUMIN  4.3 10/07/2020 0925   AST 34 10/10/2023 0402   ALT 12 10/10/2023 0402   ALKPHOS 55 10/10/2023 0402   BILITOT 1.2 10/10/2023  0402   BILITOT 0.2 10/07/2020 0925   GFRNONAA >60 10/14/2023 0337   GFRAA 81 08/17/2019 1238   Lipase     Component Value Date/Time   LIPASE 30 12/22/2022 1903       Studies/Results: No results found.  Anti-infectives: Anti-infectives (From admission, onward)    Start     Dose/Rate Route Frequency Ordered Stop   10/12/23 1400  ceFEPIme  (MAXIPIME ) 2 g in sodium chloride  0.9 % 100 mL IVPB       Note to Pharmacy: Pharmacy may adjust dosing strength, schedule, rate of infusion, etc as needed to optimize therapy   2 g 200 mL/hr over 30 Minutes Intravenous Every 8 hours 10/12/23 1036 10/17/23 1359   10/09/23 0800  metroNIDAZOLE  (FLAGYL ) IVPB 500 mg        500 mg 100 mL/hr over 60 Minutes Intravenous Every 12 hours 10/08/23 2220 10/15/23 2359   10/09/23 0000  cefTRIAXone  (ROCEPHIN ) 2 g in sodium chloride  0.9 % 100 mL IVPB  Status:  Discontinued        2 g 200 mL/hr over 30 Minutes Intravenous Every 24 hours 10/08/23 2220 10/12/23 1036   10/08/23 1730  aztreonam  (AZACTAM ) 2 g in sodium chloride  0.9 % 100 mL IVPB  2 g 200 mL/hr over 30 Minutes Intravenous  Once 10/08/23 1724 10/08/23 1812   10/08/23 1730  metroNIDAZOLE  (FLAGYL ) IVPB 500 mg        500 mg 100 mL/hr over 60 Minutes Intravenous  Once 10/08/23 1724 10/08/23 1854   10/08/23 1730  vancomycin  (VANCOCIN ) IVPB 1000 mg/200 mL premix        1,000 mg 200 mL/hr over 60 Minutes Intravenous  Once 10/08/23 1724 10/08/23 1954        Assessment/Plan Septic shock secondary to Sigmoid Diverticulitis with purulent ascites and peritonitis  POD4 s/p exploratory laparotomy, sigmoid colectomy, end colostomy Dr. Camilo Cella - continue CLD and check KUB today, stomal suppository - VAC placement today to midline  - continue abx through POD5, high risk for post-op abscess given purulent ascites - mobilize as tolerated, IS/flutter valve - WOC following for new colostomy    FEN: CLD,  VTE: LMWH  ID: rocephin /flagyl  through POD5   -  per TRH -  COPD with chronic respiratory failure on 4L at home Alcohol use disorder with history of alcohol withdrawal HTN HLD Depression  Liver lesion - isodense lesion noted on CT, nonemergent MRI recommended  Migraines RLS Tobacco abuse Obesity class I - BMI 30.73   LOS: 6 days     Annetta Killian, [facility] Surgery 10/14/2023, 10:12 AM Please see Amion for pager number during day hours 7:00am-4:30pm

## 2023-10-14 NOTE — Plan of Care (Signed)
   Problem: Education: Goal: Knowledge of General Education information will improve Description: Including pain rating scale, medication(s)/side effects and non-pharmacologic comfort measures Outcome: Progressing   Problem: Health Behavior/Discharge Planning: Goal: Ability to manage health-related needs will improve Outcome: Progressing   Problem: Clinical Measurements: Goal: Will remain free from infection Outcome: Progressing Goal: Diagnostic test results will improve Outcome: Progressing Goal: Respiratory complications will improve Outcome: Progressing Goal: Cardiovascular complication will be avoided Outcome: Progressing   Problem: Activity: Goal: Risk for activity intolerance will decrease Outcome: Progressing   Problem: Nutrition: Goal: Adequate nutrition will be maintained Outcome: Progressing

## 2023-10-14 NOTE — Consult Note (Addendum)
 WOC Nurse Consult Note: Reason for Consult: Apply Vac dressing to the abdominal suture Wound type: Surgical dehiscence. Pressure Injury POA: NA Measurement: 13 x 5 x 5 cm Wound bed: 90% pink granulated tissue. 10% fat. Drainage (amount, consistency, odor) Minimum amount, no odor. Serous Periwound: Intact Dressing procedure/placement/frequency:  Removed old NPWT dressing Cleansed wound with normal saline Periwound skin protected with window framed with drape  Filled wound with 1 piece of black foam and 1 piece of white foam. Sealed NPWT dressing at HG/13mmHG  Patient received IV pain medication per bedside nurse prior to dressing change Patient tolerated procedure well WOC nurse will continue to provide NPWT dressing changed due to the complexity of the dressing change.   WOC Nurse ostomy follow up Stoma type/location: LLQ Colostomy Stomal assessment/size: 30 mm round. Stoma red and viable, edema 2+/4+, on the skin level. Peristomal assessment: intact, painful area. Treatment options for stomal/peristomal skin: Skin removal [id number], ring [id number], belt #621 Output 20 ml of pink liquid. Ostomy pouching: 2pc. #[id number] and bag #[id number]. Education provided:  Provide educational ostomy care for the patient and her husband with no hands on.   - The pouch need to be change twice per week or if is leaking. - Clean the skin with soup and water, or just water. Keep in mind that any product can stay at the skin before apply the next pouch to have a better seal.   - Cut the barrier with the size and shape as the ostomy has. - Take the protect plastic off the barrier. That can be use as a model for the next pouch system. - Apply the ring surrounding the cut;  - Apply on the skin, stretching a little the bottom of the ostomy site to apply correctly. - The warmed skin will interact with the barrier, making it sealed.   - To empty the pouch when becomes 1/3 full. - Teach how to open and close  the lock in roll system (the patient's wife did with no help. - A bag with filter will provide a release the gas without odor, does not need to protect against the water. - The pt can take a shower normally, at the dates that the pt will change the pouch system, she can uses the warmed water to help gently take off the tape and the barrier. Enrolled patient in [place] Secure Start Discharge program: Yes (today)  WOC team will follow THURS.   Please reconsult if further assistance is needed. Thank-you,  Rachel Budds BSN, RN, [employer], WOC  (Pager: [phone])

## 2023-10-14 NOTE — Progress Notes (Signed)
 PROGRESS NOTE        PATIENT DETAILS Name: Vicki [name] Age: 58 y.o. Sex: female Date of Birth: 29-Sep-1965 Admit Date: 10/08/2023 Admitting Physician Lena Qualia, MD [id number], Schuyler Custard, FNP  Brief Summary: Patient is a 58 y.o.  female with history of COPD on 4 L of oxygen at home-presented with abdominal pain secondary to sigmoid diverticulosis, hospital course complicated by development of septic shock requiring pressors.  She subsequently developed peritonitis and was taken to the operating room where she was found to have perforated diverticulitis with purulent peritonitis and underwent Hartman's procedure.    Significant events: 5/27>> admit to [facility]-severe sepsis secondary to sigmoid diverticulitis-BP soft-PCCM consulted-keep on floor. 5/28>> persistent hypotension-septic shock-transferred to ICU 5/29>> peritonitis-persistent septic shock-2 OR-perforated diverticulitis with purulent peritonitis-underwent Hartman's procedure. 5/31>> transferred back to [facility]  Significant studies: 5/27>> CT abdomen/pelvis: Severe sigmoid diverticulitis without associated abscess/perforation.  Significant microbiology data: 5/27>> COVID/influenza/RSV PCR: Negative 5/27>> stool C. difficile studies: Negative 5/27>> blood cultures: Negative  Procedures: 5/29>> Hartman's procedure  Consults: PCCM CCS  Subjective:  Patient in bed, appears comfortable, denies any headache, no fever, no chest pain or pressure, no shortness of breath, right postop pain, passing some flatus, continue to monitor.  Objective: Vitals: Blood pressure 111/84, pulse 98, temperature 98.2 F (36.8 C), temperature source Oral, resp. rate 15, height 5\' 3"  (1.6 m), weight 78.7 kg, SpO2 94%.   Exam:  Awake Alert, No new F.N deficits, Normal affect [place].AT,PERRAL Supple Neck, No JVD,   Symmetrical Chest wall movement, Good air movement bilaterally, CTAB RRR,No Gallops, Rubs or new  Murmurs,  +ve B.Sounds, Abd Soft, colostomy bag in place. No Cyanosis, Clubbing or edema    Assessment/Plan:  Septic shock secondary to sigmoid diverticulitis with perforation resulting in peritonitis-now s/p Hartman's procedure 5/29. Sepsis physiology has resolved Required pressors while in the ICU Remains on Rocephin /Flagyl  General Surgery following and directing postop care.  Acute on chronic hypoxic/hypercapnic respiratory failure (on 4 L of oxygen at home) Worsening hypoxia-secondary to sepsis/pain-causing splitting-background of COPD Improving on 4 L nasal cannula oxygen, encouraged to sit in chair use I-S and flutter valve.  On home oxygen as well.  Normocytic anemia Secondary to critical illness Follow CBC and transfuse if significant drop  COPD Stable Continue bronchodilators  HTN BP stable without the use of any antihypertensives Resume amlodipine  when able  Hypomagnesemia.  Replaced.    Anxiety/depression Resume Celexa  when oral intake is more stable.  GERD PPI  EtOH abuse No signs of alcohol withdrawal Continue to monitor closely  Tobacco abuse Counseled  Class 1 Obesity: Estimated body mass index is 30.73 kg/m as calculated from the following:   Height as of this encounter: 5\' 3"  (1.6 m).   Weight as of this encounter: 78.7 kg.   Code status:   Code Status: Full Code   DVT Prophylaxis: enoxaparin  (LOVENOX ) injection 40 mg Start: 10/11/23 1000 SCDs Start: 10/09/23 1722    Family Communication: None at bedside   Disposition Plan: Status is: Inpatient Remains inpatient appropriate because: Severity of illness   Planned Discharge Destination:Home health   Diet: Diet Order             Diet clear liquid Room service appropriate? Yes; Fluid consistency: Thin  Diet effective now  MEDICATIONS: Scheduled Meds:  acetaminophen   1,000 mg Oral Q6H   budesonide -glycopyrrolate -formoterol   2 puff Inhalation BID    enoxaparin  (LOVENOX ) injection  40 mg Subcutaneous Q24H   guaiFENesin   600 mg Oral BID   methocarbamol   1,000 mg Oral TID   pantoprazole   40 mg Oral BID   sodium chloride  flush  3 mL Intravenous Q12H   thiamine   100 mg Oral Daily   Continuous Infusions:  ceFEPime  (MAXIPIME ) IV 2 g (10/14/23 1610)   metronidazole  500 mg (10/13/23 2040)   PRN Meds:.albuterol , ALPRAZolam , azelastine , HYDROmorphone  (DILAUDID ) injection, melatonin, ondansetron  **OR** ondansetron  (ZOFRAN ) IV, oxyCODONE , prochlorperazine , SUMAtriptan    I have personally reviewed following labs and imaging studies   Data Review:   DVT Prophylaxis  enoxaparin  (LOVENOX ) injection 40 mg Start: 10/11/23 1000 SCDs Start: 10/09/23 1722  Recent Labs  Lab 10/08/23 1633 10/09/23 0442 10/09/23 1907 10/10/23 0402 10/10/23 1524 10/11/23 0408 10/12/23 0824 10/13/23 0833 10/14/23 0337  WBC 11.3*   < > 4.5 17.1*  --  9.8 11.0* 7.9 10.1  HGB 13.0   < > 10.0* 9.9* 10.2* 8.9* 9.0* 9.9* 9.9*  HCT 39.6   < > 30.8* 30.1* 30.0* 27.3* 26.5* 29.9* 29.3*  PLT 213   < > 129* 200  --  195 217 216 231  MCV 95.2   < > 97.8 96.8  --  95.8 95.0 94.9 94.8  MCH 31.3   < > 31.7 31.8  --  31.2 32.3 31.4 32.0  MCHC 32.8   < > 32.5 32.9  --  32.6 34.0 33.1 33.8  RDW 13.1   < > 13.1 13.2  --  13.1 13.2 13.3 13.2  LYMPHSABS 0.9  --  0.3* 0.7  --   --   --   --  1.2  MONOABS 0.9  --  0.3 0.5  --   --   --   --  1.6*  EOSABS 0.0  --  0.1 0.0  --   --   --   --  0.4  BASOSABS 0.0  --  0.0 0.2*  --   --   --   --  0.0   < > = values in this interval not displayed.    Recent Labs  Lab 10/08/23 1633 10/08/23 1706 10/08/23 1900 10/09/23 0030 10/09/23 0438 10/09/23 0442 10/09/23 1907 10/09/23 2108 10/10/23 0019 10/10/23 0402 10/10/23 1524 10/11/23 0408 10/12/23 0824 10/13/23 0833 10/14/23 0337  NA 138  --   --   --   --  137 135  --   --  133* 134* 131* 134* 133* 132*  K 3.2*  --   --   --   --  2.9* 3.3*  --   --  3.6 3.8 4.5 3.7 3.7  3.7  CL 95*  --   --   --   --  99 100  --   --  101  --  101 99 98 97*  CO2 31  --   --   --   --  32 16*  --   --  23  --  23 26 24 25   ANIONGAP 12  --   --   --   --  6 19*  --   --  9  --  7 9 11 10   GLUCOSE 133*  --   --   --   --  108* 94  --   --  108*  --  123* 74  84 96  BUN <5*  --   --   --   --  <5* 5*  --   --  <5*  --  8 15 10 6   CREATININE 0.70  --   --   --   --  0.74 0.83  --   --  0.79  --  0.59 0.64 0.56 0.55  AST 61*  --   --   --   --  45* 41  --   --  34  --   --   --   --   --   ALT 16  --   --   --   --  13 11  --   --  12  --   --   --   --   --   ALKPHOS 121  --   --   --   --  78 74  --   --  55  --   --   --   --   --   BILITOT 1.2  --   --   --   --  1.0 1.4*  --   --  1.2  --   --   --   --   --   ALBUMIN  3.4*  --   --   --   --  2.4* 2.5*  --   --  2.5*  --   --   --   --   --   PROCALCITON  --   --   --   --   --   --   --  3.64  --   --   --   --   --   --   --   LATICACIDVEN  --    < > 8.7* 2.0* 1.6  --   --  5.2* 4.5*  --   --   --   --   --   --   INR 1.0  --   --   --   --   --   --   --   --   --   --   --   --   --   --   MG  --   --   --   --   --  0.7*  --  2.5*  --   --   --   --   --   --  1.2*  PHOS  --   --   --   --   --  2.2* 2.6  --   --  2.5  --   --   --   --  2.8  CALCIUM  7.9*  --   --   --   --  7.8* 7.4*  --   --  7.5*  --  7.5* 7.7* 8.0* 7.7*   < > = values in this interval not displayed.     Recent Labs  Lab 10/08/23 1633 10/08/23 1706 10/08/23 1900 10/09/23 0030 10/09/23 2130 10/09/23 8657 10/09/23 1907 10/09/23 2108 10/10/23 0019 10/10/23 0402 10/11/23 0408 10/12/23 0824 10/13/23 0833 10/14/23 0337  PROCALCITON  --   --   --   --   --   --   --  3.64  --   --   --   --   --   --   LATICACIDVEN  --    < > 8.7* 2.0* 1.6  --   --  5.2* 4.5*  --   --   --   --   --   INR 1.0  --   --   --   --   --   --   --   --   --   --   --   --   --   MG  --   --   --   --   --  0.7*  --  2.5*  --   --   --   --   --  1.2*  CALCIUM  7.9*   --   --   --   --  7.8*   < >  --   --  7.5* 7.5* 7.7* 8.0* 7.7*   < > = values in this interval not displayed.    Radiology Reports  No results found.    Signature  -   Lynnwood Sauer M.D on 10/14/2023 at 8:56 AM   -  To page go to [email or web address]

## 2023-10-14 NOTE — Plan of Care (Signed)

## 2023-10-14 NOTE — TOC Progression Note (Signed)
 Transition of Care [facility]) - Progression Note    Patient Details  Name: Vicki [name] MRN: [id number] Date of Birth: 07-06-1965  [facility]) CM/SW Contact  Jeani Mill, RN Phone Number: 10/14/2023, 3:18 PM  Clinical Narrative:    Orders for home health and wound vac. Spoke to patient and spouse at bedside. This RNCM offered choice for Home  Health, Patient states she has no preference, RNCM made referral to amy with [employer] and awaiting response.   [facility] is out of network with insurance for wound vac. Sent referral to [facility] with [facility] will email order form. Toc following.   Expected Discharge Plan: Home/Self Care Barriers to Discharge: Continued Medical Work up  Expected Discharge Plan and Services In-house Referral: Clinical Social Work     Living arrangements for the past 2 months: Single Family Home                                       Social Determinants of Health (SDOH) Interventions SDOH Screenings   Food Insecurity: No Food Insecurity (10/12/2023)  Housing: Unknown (10/12/2023)  Transportation Needs: No Transportation Needs (10/12/2023)  Utilities: Not At Risk (10/12/2023)  Alcohol Screen: Low Risk  (11/01/2022)  Depression (PHQ2-9): High Risk (04/25/2023)  Financial Resource Strain: Low Risk  (11/01/2022)  Physical Activity: Inactive (11/01/2022)  Social Connections: Moderately Isolated (11/01/2022)  Stress: No Stress Concern Present (11/01/2022)  Tobacco Use: High Risk (10/10/2023)    Readmission Risk Interventions     No data to display

## 2023-10-15 DIAGNOSIS — K5720 Diverticulitis of large intestine with perforation and abscess without bleeding: Secondary | ICD-10-CM | POA: Diagnosis not present

## 2023-10-15 LAB — CBC WITH DIFFERENTIAL/PLATELET
Abs Immature Granulocytes: 0.6 10*3/uL — ABNORMAL HIGH (ref 0.00–0.07)
Basophils Absolute: 0 10*3/uL (ref 0.0–0.1)
Basophils Relative: 0 %
Eosinophils Absolute: 0.5 10*3/uL (ref 0.0–0.5)
Eosinophils Relative: 4 %
HCT: 26.6 % — ABNORMAL LOW (ref 36.0–46.0)
Hemoglobin: 9 g/dL — ABNORMAL LOW (ref 12.0–15.0)
Immature Granulocytes: 5 %
Lymphocytes Relative: 15 %
Lymphs Abs: 1.7 10*3/uL (ref 0.7–4.0)
MCH: 32 pg (ref 26.0–34.0)
MCHC: 33.8 g/dL (ref 30.0–36.0)
MCV: 94.7 fL (ref 80.0–100.0)
Monocytes Absolute: 1.7 10*3/uL — ABNORMAL HIGH (ref 0.1–1.0)
Monocytes Relative: 15 %
Neutro Abs: 7.2 10*3/uL (ref 1.7–7.7)
Neutrophils Relative %: 61 %
Platelets: 285 10*3/uL (ref 150–400)
RBC: 2.81 MIL/uL — ABNORMAL LOW (ref 3.87–5.11)
RDW: 13.4 % (ref 11.5–15.5)
WBC: 11.6 10*3/uL — ABNORMAL HIGH (ref 4.0–10.5)
nRBC: 0 % (ref 0.0–0.2)

## 2023-10-15 LAB — SODIUM, URINE, RANDOM: Sodium, Ur: 42 mmol/L

## 2023-10-15 LAB — BASIC METABOLIC PANEL WITH GFR
Anion gap: 3 — ABNORMAL LOW (ref 5–15)
BUN: <5 mg/dL — ABNORMAL LOW (ref 6–20)
CO2: 29 mmol/L (ref 22–32)
Calcium: 7.4 mg/dL — ABNORMAL LOW (ref 8.9–10.3)
Chloride: 99 mmol/L (ref 98–111)
Creatinine, Ser: 0.55 mg/dL (ref 0.44–1.00)
GFR, Estimated: >60 mL/min (ref >60)
Glucose, Bld: 106 mg/dL — ABNORMAL HIGH (ref 70–99)
Potassium: 3.9 mmol/L (ref 3.5–5.1)
Sodium: 131 mmol/L — ABNORMAL LOW (ref 135–145)

## 2023-10-15 LAB — PHOSPHORUS: Phosphorus: 2.5 mg/dL (ref 2.5–4.6)

## 2023-10-15 LAB — GLUCOSE, CAPILLARY
Glucose-Capillary: 101 mg/dL — ABNORMAL HIGH (ref 70–99)
Glucose-Capillary: 101 mg/dL — ABNORMAL HIGH (ref 70–99)
Glucose-Capillary: 105 mg/dL — ABNORMAL HIGH (ref 70–99)
Glucose-Capillary: 106 mg/dL — ABNORMAL HIGH (ref 70–99)
Glucose-Capillary: 114 mg/dL — ABNORMAL HIGH (ref 70–99)
Glucose-Capillary: 115 mg/dL — ABNORMAL HIGH (ref 70–99)

## 2023-10-15 LAB — URIC ACID: Uric Acid, Serum: 3 mg/dL (ref 2.5–7.1)

## 2023-10-15 LAB — MAGNESIUM: Magnesium: 1.8 mg/dL (ref 1.7–2.4)

## 2023-10-15 LAB — OSMOLALITY, URINE: Osmolality, Ur: 252 mosm/kg — ABNORMAL LOW (ref 300–900)

## 2023-10-15 LAB — CREATININE, URINE, RANDOM: Creatinine, Urine: 38 mg/dL

## 2023-10-15 LAB — OSMOLALITY: Osmolality: 277 mosm/kg (ref 275–295)

## 2023-10-15 MED ORDER — HYDROMORPHONE HCL 1 MG/ML IJ SOLN: 0.5000 mg | INTRAMUSCULAR | Status: DC | PRN

## 2023-10-15 MED ORDER — HYDROMORPHONE HCL 1 MG/ML IJ SOLN
0.5000 mg | Freq: Three times a day (TID) | INTRAMUSCULAR | Status: DC | PRN
  Administered 2023-10-15 – 2023-10-18 (×6): 0.5 mg via INTRAVENOUS
  Filled 2023-10-15 (×6): qty 0.5

## 2023-10-15 MED ORDER — ENSURE MAX PROTEIN PO LIQD
11.0000 [oz_av] | Freq: Two times a day (BID) | ORAL | Status: DC
  Administered 2023-10-15 – 2023-10-18 (×4): 11 [oz_av] via ORAL
  Filled 2023-10-15 (×10): qty 330

## 2023-10-15 MED ORDER — MIDODRINE HCL 5 MG PO TABS
10.0000 mg | ORAL_TABLET | Freq: Three times a day (TID) | ORAL | Status: DC
  Administered 2023-10-15 – 2023-10-19 (×13): 10 mg via ORAL
  Filled 2023-10-15 (×13): qty 2

## 2023-10-15 MED ORDER — LACTATED RINGERS IV BOLUS
1000.0000 mL | Freq: Once | INTRAVENOUS | Status: AC
  Administered 2023-10-15: 1000 mL via INTRAVENOUS

## 2023-10-15 MED ORDER — OXYCODONE HCL 5 MG PO TABS
5.0000 mg | ORAL_TABLET | ORAL | Status: DC | PRN
  Administered 2023-10-15 – 2023-10-19 (×19): 5 mg via ORAL
  Filled 2023-10-15 (×19): qty 1

## 2023-10-15 NOTE — Progress Notes (Signed)
 Physical Therapy Treatment Patient Details Name: Vicki [name] MRN: [id number] DOB: Feb 12, 1966 Today's Date: 10/15/2023   History of Present Illness 58 yo female admitted 10/08/23 with pelvic and lower back pain, diarrhea, sepsis due to diverticulitis. 5/29 Ex lap with sigmoid colectomy, end colostomy. 5/29 extubated.  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    PT Comments  Progressing well, requires up to min assist for bed mobility and transfers today. Cues for technique. Slow and guarded movements. Very motivated. Out of bed to chair with short distance ambulation in room at CGA level. Feels better upright in chair. Patient will continue to benefit from skilled physical therapy services to further improve independence with functional mobility.   BP supine 88/64 HR 90. Seated 105/79 HR 94. Reclined in chair after gait 94/69 HR 85.     If plan is discharge home, recommend the following: A little help with walking and/or transfers;A little help with bathing/dressing/bathroom;Assistance with cooking/housework;Assist for transportation;Help with stairs or ramp for entrance   Can travel by private vehicle        Equipment Recommendations  BSC/3in1    Recommendations for Other Services       Precautions / Restrictions Precautions Precautions: Fall Recall of Precautions/Restrictions: Impaired Precaution/Restrictions Comments: watch sats, desats with all movement, open abdomen, ostomy Restrictions Weight Bearing Restrictions Per Provider Order: No     Mobility  Bed Mobility Overal bed mobility: Needs Assistance Bed Mobility: Rolling, Sidelying to Sit Rolling: Min assist, Used rails Sidelying to sit: Min assist, HOB elevated, Used rails       General bed mobility comments: Partial roll towards left, cues to be mindful of colostomy, assisted with multiple lines/leads/vac. Min assist to complete transition to edge of bed and rise with trunk support.     Transfers Overall transfer level: Needs assistance Equipment used: Rolling walker (2 wheels) Transfers: Sit to/from Stand Sit to Stand: Min assist           General transfer comment: Min assist for boost to stand, very slow to straighten trunk due to abdominal pain but gradually tolerated upright stance. RW for support.    Ambulation/Gait Ambulation/Gait assistance: Contact guard assist Gait Distance (Feet): 16 Feet Assistive device: Rolling walker (2 wheels) Gait Pattern/deviations: Step-through pattern, Decreased stride length, Trunk flexed Gait velocity: dec Gait velocity interpretation: <1.31 ft/sec, indicative of household ambulator   General Gait Details: CGA for safety, cues for RW proximity and upright stance as tolerated. Cues for RW placement with turns and backing up. No buckling. Very slow and guarded but stable with RW to support. SpO2 91% on 4L supplemental O2.   Stairs             Wheelchair Mobility     Tilt Bed    Modified Rankin (Stroke Patients Only)       Balance Overall balance assessment: Needs assistance Sitting-balance support: Feet supported Sitting balance-Leahy Scale: Good     Standing balance support: Bilateral upper extremity supported Standing balance-Leahy Scale: Poor Standing balance comment: RW in standing                            Communication Communication Communication: No apparent difficulties  Cognition Arousal: Alert Behavior During Therapy: WFL for tasks assessed/performed   PT - Cognitive impairments: No apparent impairments  Following commands: Intact      Cueing Cueing Techniques: Verbal cues  Exercises General Exercises - Lower Extremity Ankle Circles/Pumps: PROM, Both, 10 reps, Supine Quad Sets: Strengthening, Both, 10 reps, Seated Gluteal Sets: Strengthening, Both, 10 reps, Seated    General Comments General comments (skin integrity, edema, etc.): BP  supine 88/64 HR 90. Seated 105/79 HR 94. Reclined in chair after gait 94/69 HR 85.      Pertinent Vitals/Pain Pain Assessment Pain Assessment: Faces Faces Pain Scale: Hurts whole lot Pain Location: abdomen with movement Pain Descriptors / Indicators: Guarding, Grimacing, Sore Pain Intervention(s): Limited activity within patient's tolerance, Monitored during session, Repositioned    Home Living                          Prior Function            PT Goals (current goals can now be found in the care plan section) Acute Rehab PT Goals Patient Stated Goal: return home PT Goal Formulation: With patient Time For Goal Achievement: 10/25/23 Potential to Achieve Goals: Good Progress towards PT goals: Progressing toward goals    Frequency    Min 2X/week      PT Plan      Co-evaluation              AM-PAC PT "6 Clicks" Mobility   Outcome Measure  Help needed turning from your back to your side while in a flat bed without using bedrails?: A Little Help needed moving from lying on your back to sitting on the side of a flat bed without using bedrails?: A Little Help needed moving to and from a bed to a chair (including a wheelchair)?: A Little Help needed standing up from a chair using your arms (e.g., wheelchair or bedside chair)?: A Little Help needed to walk in hospital room?: A Little Help needed climbing 3-5 steps with a railing? : Total 6 Click Score: 16    End of Session Equipment Utilized During Treatment: Oxygen Activity Tolerance: Patient tolerated treatment well Patient left: in chair;with call bell/phone within reach;with chair alarm set Nurse Communication: Mobility status (Needs new purewick) PT Visit Diagnosis: Other abnormalities of gait and mobility (R26.89);Difficulty in walking, not elsewhere classified (R26.2);Unsteadiness on feet (R26.81);Muscle weakness (generalized) (M62.81);Pain Pain - part of body:  (abdomen)     Time: 1610-9604 PT  Time Calculation (min) (ACUTE ONLY): 29 min  Charges:    $Gait Training: 8-22 mins $Therapeutic Activity: 8-22 mins PT General Charges $$ ACUTE PT VISIT: 1 Visit                     Jory Ng, PT, DPT [facility]  Rehabilitation Services Physical Therapist Office: [phone] Website: [email or web address]    Alinda Irani 10/15/2023, 11:15 AM

## 2023-10-15 NOTE — Progress Notes (Signed)
 PROGRESS NOTE        PATIENT DETAILS Name: Vicki [name] Age: 58 y.o. Sex: female Date of Birth: 12-Mar-1966 Admit Date: 10/08/2023 Admitting Physician Lena Qualia, MD ZOX:[id number], Schuyler Custard, FNP  Brief Summary: Patient is a 58 y.o.  female with history of COPD on 4 L of oxygen at home-presented with abdominal pain secondary to sigmoid diverticulosis, hospital course complicated by development of septic shock requiring pressors.  She subsequently developed peritonitis and was taken to the operating room where she was found to have perforated diverticulitis with purulent peritonitis and underwent Hartman's procedure.    Significant events: 5/27>> admit to [facility]-severe sepsis secondary to sigmoid diverticulitis-BP soft-PCCM consulted-keep on floor. 5/28>> persistent hypotension-septic shock-transferred to ICU 5/29>> peritonitis-persistent septic shock-2 OR-perforated diverticulitis with purulent peritonitis-underwent Hartman's procedure. 5/31>> transferred back to [facility]  Significant studies: 5/27>> CT abdomen/pelvis: Severe sigmoid diverticulitis without associated abscess/perforation.  Significant microbiology data: 5/27>> COVID/influenza/RSV PCR: Negative 5/27>> stool C. difficile studies: Negative 5/27>> blood cultures: Negative  Procedures: 5/29>> Hartman's procedure  Consults: PCCM [facility]  Subjective:  In bed denies any headache, no chest pain, no shortness of breath, abdominal discomfort much improved, passing flatus and some liquid stool in the colostomy bag.  Objective: Vitals: Blood pressure (!) 99/58, pulse 87, temperature 98.1 F (36.7 C), temperature source Oral, resp. rate 17, height 5\' 3"  (1.6 m), weight 78.7 kg, SpO2 96%.   Exam:  Awake Alert, No new F.N deficits, Normal affect [place].AT,PERRAL Supple Neck, No JVD,   Symmetrical Chest wall movement, Good air movement bilaterally, CTAB RRR,No Gallops, Rubs or new Murmurs,  +ve  B.Sounds, Abd Soft, colostomy bag in place. No Cyanosis, Clubbing or edema    Assessment/Plan:  Septic shock secondary to sigmoid diverticulitis with perforation resulting in peritonitis-now s/p Hartman's procedure 5/29. Sepsis physiology has resolved Required pressors while in the ICU Remains on Rocephin /Flagyl  General Surgery following and directing postop care.  Acute on chronic hypoxic/hypercapnic respiratory failure (on 4 L of oxygen at home) Worsening hypoxia-secondary to sepsis/pain-causing splitting-background of COPD Improving on 4 L nasal cannula oxygen, encouraged to sit in chair use I-S and flutter valve.  On home oxygen as well.  Normocytic anemia Secondary to critical illness Follow CBC and transfuse if significant drop  COPD Stable Continue bronchodilators  HTN BP stable without the use of any antihypertensives Resume amlodipine  when able  Hypomagnesemia.  Replaced.    Hyponatremia.  Could be SIADH versus dehydration.  Check urine electrolytes and monitor.    Anxiety/depression Resume Celexa  when oral intake is more stable.  GERD PPI  EtOH abuse No signs of alcohol withdrawal Continue to monitor closely  Tobacco abuse Counseled  Liver lesion - isodense lesion noted on CT, nonemergent MRI recommended outpt per PCP   Class 1 Obesity: Estimated body mass index is 30.73 kg/m as calculated from the following:   Height as of this encounter: 5\' 3"  (1.6 m).   Weight as of this encounter: 78.7 kg.   Code status:   Code Status: Full Code   DVT Prophylaxis: enoxaparin  (LOVENOX ) injection 40 mg Start: 10/11/23 1000 SCDs Start: 10/09/23 1722    Family Communication: None at bedside   Disposition Plan: Status is: Inpatient Remains inpatient appropriate because: Severity of illness   Planned Discharge Destination:Home health   Diet: Diet Order  Diet full liquid Room service appropriate? Yes; Fluid consistency: Thin  Diet effective  now                   MEDICATIONS: Scheduled Meds:  acetaminophen   1,000 mg Oral Q6H   budesonide -glycopyrrolate -formoterol   2 puff Inhalation BID   enoxaparin  (LOVENOX ) injection  40 mg Subcutaneous Q24H   guaiFENesin   600 mg Oral BID   methocarbamol   1,000 mg Oral TID   pantoprazole   40 mg Oral BID   sodium chloride  flush  3 mL Intravenous Q12H   SMOG  200 mL Rectal Once   thiamine   100 mg Oral Daily   Continuous Infusions:  ceFEPime  (MAXIPIME ) IV 2 g (10/15/23 0508)   metronidazole  500 mg (10/15/23 0754)   PRN Meds:.albuterol , ALPRAZolam , azelastine , HYDROmorphone  (DILAUDID ) injection, melatonin, ondansetron  **OR** ondansetron  (ZOFRAN ) IV, oxyCODONE , prochlorperazine , SUMAtriptan    I have personally reviewed following labs and imaging studies   Data Review:   DVT Prophylaxis  enoxaparin  (LOVENOX ) injection 40 mg Start: 10/11/23 1000 SCDs Start: 10/09/23 1722  Recent Labs  Lab 10/08/23 1633 10/09/23 0442 10/09/23 1907 10/10/23 0402 10/10/23 1524 10/11/23 0408 10/12/23 0824 10/13/23 0833 10/14/23 0337 10/15/23 0318  WBC 11.3*   < > 4.5 17.1*  --  9.8 11.0* 7.9 10.1 11.6*  HGB 13.0   < > 10.0* 9.9*   < > 8.9* 9.0* 9.9* 9.9* 9.0*  HCT 39.6   < > 30.8* 30.1*   < > 27.3* 26.5* 29.9* 29.3* 26.6*  PLT 213   < > 129* 200  --  195 217 216 231 285  MCV 95.2   < > 97.8 96.8  --  95.8 95.0 94.9 94.8 94.7  MCH 31.3   < > 31.7 31.8  --  31.2 32.3 31.4 32.0 32.0  MCHC 32.8   < > 32.5 32.9  --  32.6 34.0 33.1 33.8 33.8  RDW 13.1   < > 13.1 13.2  --  13.1 13.2 13.3 13.2 13.4  LYMPHSABS 0.9  --  0.3* 0.7  --   --   --   --  1.2 1.7  MONOABS 0.9  --  0.3 0.5  --   --   --   --  1.6* 1.7*  EOSABS 0.0  --  0.1 0.0  --   --   --   --  0.4 0.5  BASOSABS 0.0  --  0.0 0.2*  --   --   --   --  0.0 0.0   < > = values in this interval not displayed.    Recent Labs  Lab 10/08/23 1633 10/08/23 1706 10/08/23 1900 10/09/23 0030 10/09/23 7829 10/09/23 5621 10/09/23 1907  10/09/23 2108 10/10/23 0019 10/10/23 0402 10/10/23 1524 10/11/23 0408 10/12/23 0824 10/13/23 0833 10/14/23 0337 10/15/23 0318  NA 138  --   --   --   --  137 135  --   --  133*   < > 131* 134* 133* 132* 131*  K 3.2*  --   --   --   --  2.9* 3.3*  --   --  3.6   < > 4.5 3.7 3.7 3.7 3.9  CL 95*  --   --   --   --  99 100  --   --  101  --  101 99 98 97* 99  CO2 31  --   --   --   --  32 16*  --   --  23  --  23 26 24 25 29   ANIONGAP 12  --   --   --   --  6 19*  --   --  9  --  7 9 11 10  3*  GLUCOSE 133*  --   --   --   --  108* 94  --   --  108*  --  123* 74 84 96 106*  BUN <5*  --   --   --   --  <5* 5*  --   --  <5*  --  8 15 10 6  <5*  CREATININE 0.70  --   --   --   --  0.74 0.83  --   --  0.79  --  0.59 0.64 0.56 0.55 0.55  AST 61*  --   --   --   --  45* 41  --   --  34  --   --   --   --   --   --   ALT 16  --   --   --   --  13 11  --   --  12  --   --   --   --   --   --   ALKPHOS 121  --   --   --   --  78 74  --   --  55  --   --   --   --   --   --   BILITOT 1.2  --   --   --   --  1.0 1.4*  --   --  1.2  --   --   --   --   --   --   ALBUMIN  3.4*  --   --   --   --  2.4* 2.5*  --   --  2.5*  --   --   --   --   --   --   PROCALCITON  --   --   --   --   --   --   --  3.64  --   --   --   --   --   --   --   --   LATICACIDVEN  --    < > 8.7* 2.0* 1.6  --   --  5.2* 4.5*  --   --   --   --   --   --   --   INR 1.0  --   --   --   --   --   --   --   --   --   --   --   --   --   --   --   MG  --   --   --   --   --  0.7*  --  2.5*  --   --   --   --   --   --  1.2* 1.8  PHOS  --   --   --   --   --  2.2* 2.6  --   --  2.5  --   --   --   --  2.8 2.5  CALCIUM  7.9*  --   --   --   --  7.8* 7.4*  --   --  7.5*  --  7.5* 7.7* 8.0* 7.7* 7.4*   < > =  values in this interval not displayed.     Recent Labs  Lab 10/08/23 1633 10/08/23 1706 10/08/23 1900 10/09/23 0030 10/09/23 1610 10/09/23 9604 10/09/23 1907 10/09/23 2108 10/10/23 0019 10/10/23 0402 10/11/23 0408  10/12/23 5409 10/13/23 8119 10/14/23 0337 10/15/23 0318  PROCALCITON  --   --   --   --   --   --   --  3.64  --   --   --   --   --   --   --   LATICACIDVEN  --    < > 8.7* 2.0* 1.6  --   --  5.2* 4.5*  --   --   --   --   --   --   INR 1.0  --   --   --   --   --   --   --   --   --   --   --   --   --   --   MG  --   --   --   --   --  0.7*  --  2.5*  --   --   --   --   --  1.2* 1.8  CALCIUM  7.9*  --   --   --   --  7.8*   < >  --   --    < > 7.5* 7.7* 8.0* 7.7* 7.4*   < > = values in this interval not displayed.    Radiology Reports  DG Abd Portable 1V Result Date: 10/14/2023 CLINICAL DATA:  Postoperative ileus EXAM: PORTABLE ABDOMEN - 1 VIEW COMPARISON:  Abdominal x-ray 10/19/2020 FINDINGS: There is gaseous distention of the colon and central small bowel. There is no evidence for bowel obstruction. No dilated bowel loops are seen. Cholecystectomy clips are present. No free air. Lung bases are clear. No acute osseous abnormality. IMPRESSION: Nonobstructive bowel gas pattern. Electronically Signed   By: Tyron Gallon M.D.   On: 10/14/2023 17:26      Signature  -   Lynnwood Sauer M.D on 10/15/2023 at 8:44 AM   -  To page go to [email or web address]

## 2023-10-15 NOTE — Care Management Important Message (Signed)
 Important Message  Patient Details  Name: Vicki [name] MRN: [id number] Date of Birth: 1965-08-03   Important Message Given:  Yes - Medicare IM     Wynonia Hedges 10/15/2023, 3:16 PM

## 2023-10-15 NOTE — Plan of Care (Signed)
 PROGRESSING

## 2023-10-15 NOTE — Care Management Important Message (Signed)
 Important Message  Patient Details  Name: Vicki [name] MRN: [id number] Date of Birth: [birth year]   Important Message Given:  Yes - Medicare IM     Wynonia Hedges 10/15/2023, 11:38 AM

## 2023-10-15 NOTE — Progress Notes (Signed)
 Occupational Therapy Treatment Patient Details Name: Vicki [name] MRN: [id number] DOB: [birth year] Today's Date: 10/15/2023   History of present illness 58 yo female admitted 10/08/23 with pelvic and lower back pain, diarrhea, sepsis due to diverticulitis. 5/29 Ex lap with sigmoid colectomy, end colostomy. 5/29 extubated.  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   OT comments  Patient received in recliner and agreeable to participate with OT for AE training. Patient able to perform grooming seated in recliner with setup. AE training provided for LB dressing with reacher and sock aide with patient able to return demonstration with min assist and verbal cues. Patient limited with mobility due to abdominal pain. Discharge recommendations continue to be appropriate. Acute OT to continue to follow to address established goals.       If plan is discharge home, recommend the following:  Assist for transportation;Assistance with cooking/housework;A little help with bathing/dressing/bathroom   Equipment Recommendations  Tub/shower seat    Recommendations for Other Services      Precautions / Restrictions Precautions Precautions: Fall Recall of Precautions/Restrictions: Impaired Precaution/Restrictions Comments: watch sats, desats with all movement, open abdomen, ostomy Restrictions Weight Bearing Restrictions Per Provider Order: No       Mobility Bed Mobility Overal bed mobility: Needs Assistance             General bed mobility comments: OOB in recliner    Transfers                   General transfer comment: declined transfers. Up in recline with 8/10 pain     Balance Overall balance assessment: Needs assistance Sitting-balance support: Feet supported Sitting balance-Leahy Scale: Good Sitting balance - Comments: seated in recliner with no back support                                   ADL either performed or assessed with clinical  judgement   ADL Overall ADL's : Needs assistance/impaired     Grooming: Wash/dry hands;Wash/dry face;Oral care;Set up;Sitting Grooming Details (indicate cue type and reason): in recliner             Lower Body Dressing: Minimal assistance;With adaptive equipment;Sitting/lateral leans Lower Body Dressing Details (indicate cue type and reason): AE training with reacher and sock aide with min assist and min verbal cues               General ADL Comments: Patient with increased abdominal pain and performed OT seated in recliner    Extremity/Trunk Assessment              Vision       Perception     Praxis     Communication Communication Communication: No apparent difficulties   Cognition Arousal: Alert Behavior During Therapy: WFL for tasks assessed/performed Cognition: No apparent impairments                               Following commands: Intact        Cueing   Cueing Techniques: Verbal cues  Exercises      Shoulder Instructions       General Comments BP supine 88/64 HR 90. Seated 105/79 HR 94. Reclined in chair after gait 94/69 HR 85.    Pertinent Vitals/ Pain       Pain Assessment Pain Assessment: Faces Pain Score: 8  Faces Pain Scale: Hurts whole lot Pain Location: abdomen with movement Pain Descriptors / Indicators: Guarding, Grimacing, Sore Pain Intervention(s): Limited activity within patient's tolerance, Monitored during session, Repositioned, Patient requesting pain meds-RN notified  Home Living                                          Prior Functioning/Environment              Frequency  Min 2X/week        Progress Toward Goals  OT Goals(current goals can now be found in the care plan section)  Progress towards OT goals: Progressing toward goals  Acute Rehab OT Goals Patient Stated Goal: to go home OT Goal Formulation: With patient Time For Goal Achievement: 10/25/23 Potential to  Achieve Goals: Good ADL Goals Pt Will Perform Grooming: with modified independence;standing Pt Will Perform Lower Body Dressing: with modified independence;sit to/from stand;with adaptive equipment Pt Will Transfer to Toilet: with modified independence;regular height toilet;ambulating  Plan      Co-evaluation                 AM-PAC OT "6 Clicks" Daily Activity     Outcome Measure   Help from another person eating meals?: None Help from another person taking care of personal grooming?: None Help from another person toileting, which includes using toliet, bedpan, or urinal?: A Little Help from another person bathing (including washing, rinsing, drying)?: A Lot Help from another person to put on and taking off regular upper body clothing?: A Lot Help from another person to put on and taking off regular lower body clothing?: A Little 6 Click Score: 18    End of Session Equipment Utilized During Treatment: Oxygen  OT Visit Diagnosis: Unsteadiness on feet (R26.81);Pain;Muscle weakness (generalized) (M62.81) Pain - part of body:  (abdomin)   Activity Tolerance Patient limited by pain   Patient Left in chair;with call bell/phone within reach   Nurse Communication Mobility status;Patient requests pain meds        Time: 3220-2542 OT Time Calculation (min): 28 min  Charges: OT General Charges $OT Visit: 1 Visit OT Treatments $Self Care/Home Management : 23-37 mins  Anitra Barn, OTA Acute Rehabilitation Services  Office [phone]   Jovita Nipper 10/15/2023, 2:31 PM

## 2023-10-15 NOTE — Progress Notes (Signed)
 Progress Note  5 Days Post-Op  Subjective: Pt reports cramping gas pains - passing flatus via stoma but still no still. Moving in bed but no one helped her to chair yesterday. Tolerating FLD but not hungry.   Objective: Vital signs in last 24 hours: Temp:  [98.1 F (36.7 C)] 98.1 F (36.7 C) (06/02 1606) Pulse Rate:  [84-87] 87 (06/03 0000) Resp:  [17] 17 (06/03 0000) BP: (99)/(58) 99/58 (06/03 0000) SpO2:  [96 %] 96 % (06/03 0908) Last BM Date : 10/10/23  Intake/Output from previous day: 06/02 0701 - 06/03 0700 In: 100 [IV Piggyback:100] Out: 400 [Urine:400] Intake/Output this shift: No intake/output data recorded.  PE: General: WD, obese female, NAD  HEENT: Sclera anicteric Heart: RRR Lungs: on 4L via [place] which is baseline, normal effot  Abd: mildly distended, appropriately ttp, midline wound with VAC present, ostomy viable with gas and sweat in bag Psych: A&Ox3 with an appropriate affect.    Lab Results:  Recent Labs    10/14/23 0337 10/15/23 0318  WBC 10.1 11.6*  HGB 9.9* 9.0*  HCT 29.3* 26.6*  PLT 231 285   BMET Recent Labs    10/14/23 0337 10/15/23 0318  NA 132* 131*  K 3.7 3.9  CL 97* 99  CO2 25 29  GLUCOSE 96 106*  BUN 6 <5*  CREATININE 0.55 0.55  CALCIUM  7.7* 7.4*   PT/INR No results for input(s): "LABPROT", "INR" in the last 72 hours.  CMP     Component Value Date/Time   NA 131 (L) 10/15/2023 0318   NA 140 10/07/2020 0925   K 3.9 10/15/2023 0318   CL 99 10/15/2023 0318   CO2 29 10/15/2023 0318   GLUCOSE 106 (H) 10/15/2023 0318   BUN <5 (L) 10/15/2023 0318   BUN 12 10/07/2020 0925   CREATININE 0.55 10/15/2023 0318   CREATININE 0.76 04/25/2023 1136   CALCIUM  7.4 (L) 10/15/2023 0318   PROT 5.0 (L) 10/10/2023 0402   PROT 7.0 10/07/2020 0925   ALBUMIN  2.5 (L) 10/10/2023 0402   ALBUMIN  4.3 10/07/2020 0925   AST 34 10/10/2023 0402   ALT 12 10/10/2023 0402   ALKPHOS 55 10/10/2023 0402   BILITOT 1.2 10/10/2023 0402   BILITOT 0.2  10/07/2020 0925   GFRNONAA >60 10/15/2023 0318   GFRAA 81 08/17/2019 1238   Lipase     Component Value Date/Time   LIPASE 30 12/22/2022 1903       Studies/Results: DG Abd Portable 1V Result Date: 10/14/2023 CLINICAL DATA:  Postoperative ileus EXAM: PORTABLE ABDOMEN - 1 VIEW COMPARISON:  Abdominal x-ray 10/19/2020 FINDINGS: There is gaseous distention of the colon and central small bowel. There is no evidence for bowel obstruction. No dilated bowel loops are seen. Cholecystectomy clips are present. No free air. Lung bases are clear. No acute osseous abnormality. IMPRESSION: Nonobstructive bowel gas pattern. Electronically Signed   By: Tyron Gallon M.D.   On: 10/14/2023 17:26    Anti-infectives: Anti-infectives (From admission, onward)    Start     Dose/Rate Route Frequency Ordered Stop   10/12/23 1400  ceFEPIme  (MAXIPIME ) 2 g in sodium chloride  0.9 % 100 mL IVPB       Note to Pharmacy: Pharmacy may adjust dosing strength, schedule, rate of infusion, etc as needed to optimize therapy   2 g 200 mL/hr over 30 Minutes Intravenous Every 8 hours 10/12/23 1036 10/17/23 1359   10/09/23 0800  metroNIDAZOLE  (FLAGYL ) IVPB 500 mg  500 mg 100 mL/hr over 60 Minutes Intravenous Every 12 hours 10/08/23 2220 10/15/23 2359   10/09/23 0000  cefTRIAXone  (ROCEPHIN ) 2 g in sodium chloride  0.9 % 100 mL IVPB  Status:  Discontinued        2 g 200 mL/hr over 30 Minutes Intravenous Every 24 hours 10/08/23 2220 10/12/23 1036   10/08/23 1730  aztreonam  (AZACTAM ) 2 g in sodium chloride  0.9 % 100 mL IVPB        2 g 200 mL/hr over 30 Minutes Intravenous  Once 10/08/23 1724 10/08/23 1812   10/08/23 1730  metroNIDAZOLE  (FLAGYL ) IVPB 500 mg        500 mg 100 mL/hr over 60 Minutes Intravenous  Once 10/08/23 1724 10/08/23 1854   10/08/23 1730  vancomycin  (VANCOCIN ) IVPB 1000 mg/200 mL premix        1,000 mg 200 mL/hr over 60 Minutes Intravenous  Once 10/08/23 1724 10/08/23 1954         Assessment/Plan Septic shock secondary to Sigmoid Diverticulitis with purulent ascites and peritonitis  POD5 s/p exploratory laparotomy, sigmoid colectomy, end colostomy Dr. Camilo Cella - advance to soft diet, added ensure max BID - VAC M-R  - continue abx through POD5, high risk for post-op abscess given purulent ascites - repeat CBC in AM - if no real bowel function by tomorrow consider CT AP tomorrow  - mobilize as tolerated, IS/flutter valve - WOC following for new colostomy    FEN: soft diet, ensure  VTE: LMWH  ID: rocephin /flagyl  through POD5   - per [facility] -  COPD with chronic respiratory failure on 4L at home Alcohol use disorder with history of alcohol withdrawal HTN HLD Depression  Liver lesion - isodense lesion noted on CT, nonemergent MRI recommended  Migraines RLS Tobacco abuse Obesity class I - BMI 30.73   LOS: 7 days     Annetta Killian, [facility] 10/15/2023, 9:35 AM Please see Amion for pager number during day hours 7:00am-4:30pm

## 2023-10-16 ENCOUNTER — Inpatient Hospital Stay (HOSPITAL_COMMUNITY)

## 2023-10-16 DIAGNOSIS — K5720 Diverticulitis of large intestine with perforation and abscess without bleeding: Secondary | ICD-10-CM | POA: Diagnosis not present

## 2023-10-16 LAB — GLUCOSE, CAPILLARY
Glucose-Capillary: 89 mg/dL (ref 70–99)
Glucose-Capillary: 84 mg/dL (ref 70–99)
Glucose-Capillary: 124 mg/dL — ABNORMAL HIGH (ref 70–99)
Glucose-Capillary: 115 mg/dL — ABNORMAL HIGH (ref 70–99)
Glucose-Capillary: 139 mg/dL — ABNORMAL HIGH (ref 70–99)

## 2023-10-16 LAB — BASIC METABOLIC PANEL WITH GFR
Anion gap: 6 (ref 5–15)
BUN: 6 mg/dL (ref 6–20)
CO2: 25 mmol/L (ref 22–32)
Calcium: 7.6 mg/dL — ABNORMAL LOW (ref 8.9–10.3)
Chloride: 102 mmol/L (ref 98–111)
Creatinine, Ser: 0.63 mg/dL (ref 0.44–1.00)
GFR, Estimated: >60 mL/min (ref >60)
Glucose, Bld: 92 mg/dL (ref 70–99)
Potassium: 3.9 mmol/L (ref 3.5–5.1)
Sodium: 133 mmol/L — ABNORMAL LOW (ref 135–145)

## 2023-10-16 LAB — CBC WITH DIFFERENTIAL/PLATELET
Abs Immature Granulocytes: 0.3 10*3/uL — ABNORMAL HIGH (ref 0.00–0.07)
Basophils Absolute: 0 10*3/uL (ref 0.0–0.1)
Basophils Relative: 0 %
Eosinophils Absolute: 0.3 10*3/uL (ref 0.0–0.5)
Eosinophils Relative: 2 %
HCT: 28.6 % — ABNORMAL LOW (ref 36.0–46.0)
Hemoglobin: 9.4 g/dL — ABNORMAL LOW (ref 12.0–15.0)
Lymphocytes Relative: 4 %
Lymphs Abs: 0.7 10*3/uL (ref 0.7–4.0)
MCH: 31.2 pg (ref 26.0–34.0)
MCHC: 32.9 g/dL (ref 30.0–36.0)
MCV: 95 fL (ref 80.0–100.0)
Metamyelocytes Relative: 1 %
Monocytes Absolute: 3.1 10*3/uL — ABNORMAL HIGH (ref 0.1–1.0)
Monocytes Relative: 19 %
Myelocytes: 1 %
Neutro Abs: 12 10*3/uL — ABNORMAL HIGH (ref 1.7–7.7)
Neutrophils Relative %: 73 %
Platelets: 331 10*3/uL (ref 150–400)
RBC: 3.01 MIL/uL — ABNORMAL LOW (ref 3.87–5.11)
RDW: 13.6 % (ref 11.5–15.5)
WBC: 16.4 10*3/uL — ABNORMAL HIGH (ref 4.0–10.5)
nRBC: 0 /100{WBCs}
nRBC: 0.1 % (ref 0.0–0.2)

## 2023-10-16 LAB — URINALYSIS, ROUTINE W REFLEX MICROSCOPIC
Bilirubin Urine: NEGATIVE
Glucose, UA: NEGATIVE mg/dL
Hgb urine dipstick: NEGATIVE
Ketones, ur: 15 mg/dL — AB
Nitrite: POSITIVE — AB
Protein, ur: NEGATIVE mg/dL
Specific Gravity, Urine: 1.015 (ref 1.005–1.030)
pH: 6.5 (ref 5.0–8.0)

## 2023-10-16 LAB — URINALYSIS, MICROSCOPIC (REFLEX)

## 2023-10-16 LAB — PHOSPHORUS: Phosphorus: 2.6 mg/dL (ref 2.5–4.6)

## 2023-10-16 LAB — MAGNESIUM: Magnesium: 1.5 mg/dL — ABNORMAL LOW (ref 1.7–2.4)

## 2023-10-16 LAB — C-REACTIVE PROTEIN: CRP: 15.3 mg/dL — ABNORMAL HIGH (ref <1.0)

## 2023-10-16 LAB — PROCALCITONIN: Procalcitonin: 7.47 ng/mL

## 2023-10-16 MED ORDER — MAGNESIUM SULFATE 4 GM/100ML IV SOLN
4.0000 g | Freq: Once | INTRAVENOUS | Status: AC
  Administered 2023-10-16: 4 g via INTRAVENOUS
  Filled 2023-10-16: qty 100

## 2023-10-16 MED ORDER — IOHEXOL 350 MG/ML SOLN
75.0000 mL | Freq: Once | INTRAVENOUS | Status: AC | PRN
  Administered 2023-10-16: 75 mL via INTRAVENOUS

## 2023-10-16 MED ORDER — LACTATED RINGERS IV BOLUS
500.0000 mL | Freq: Once | INTRAVENOUS | Status: AC
  Administered 2023-10-16: 500 mL via INTRAVENOUS

## 2023-10-16 MED ORDER — LACTATED RINGERS IV SOLN: INTRAVENOUS | Status: AC

## 2023-10-16 NOTE — Progress Notes (Signed)
 Progress Note  6 Days Post-Op  Subjective: Pt having actual stool output from ostomy now but still poor appetite and some nausea overnight. Some pain with trying to urinate but difficult to assess whether this is dysuria vs just pain from incision.   Objective: Vital signs in last 24 hours: Temp:  [97.8 F (36.6 C)-98.6 F (37 C)] 98 F (36.7 C) (06/04 0758) Pulse Rate:  [81-84] 84 (06/04 0758) Resp:  [16-19] 19 (06/04 0758) BP: (93-104)/(62-73) 104/73 (06/04 0755) SpO2:  [93 %-95 %] 93 % (06/04 0758) Weight:  [90.7 kg] 90.7 kg (06/04 0500) Last BM Date : 10/15/23  Intake/Output from previous day: 06/03 0701 - 06/04 0700 In: 200 [IV Piggyback:200] Out: 30 [Stool:30] Intake/Output this shift: Total I/O In: -  Out: 750 [Urine:750]  PE: General: WD, obese female, NAD  HEENT: Sclera anicteric Heart: RRR Lungs: on 4L via [place] which is baseline, normal effot  Abd: mildly distended, appropriately ttp, midline wound with VAC present, ostomy viable with gas and liquid stool in bag Psych: A&Ox3 with an appropriate affect.    Lab Results:  Recent Labs    10/15/23 0318 10/16/23 0318  WBC 11.6* 16.4*  HGB 9.0* 9.4*  HCT 26.6* 28.6*  PLT 285 331   BMET Recent Labs    10/15/23 0318 10/16/23 0318  NA 131* 133*  K 3.9 3.9  CL 99 102  CO2 29 25  GLUCOSE 106* 92  BUN <5* 6  CREATININE 0.55 0.63  CALCIUM  7.4* 7.6*   PT/INR No results for input(s): "LABPROT", "INR" in the last 72 hours.  CMP     Component Value Date/Time   NA 133 (L) 10/16/2023 0318   NA 140 10/07/2020 0925   K 3.9 10/16/2023 0318   CL 102 10/16/2023 0318   CO2 25 10/16/2023 0318   GLUCOSE 92 10/16/2023 0318   BUN 6 10/16/2023 0318   BUN 12 10/07/2020 0925   CREATININE 0.63 10/16/2023 0318   CREATININE 0.76 04/25/2023 1136   CALCIUM  7.6 (L) 10/16/2023 0318   PROT 5.0 (L) 10/10/2023 0402   PROT 7.0 10/07/2020 0925   ALBUMIN  2.5 (L) 10/10/2023 0402   ALBUMIN  4.3 10/07/2020 0925   AST 34  10/10/2023 0402   ALT 12 10/10/2023 0402   ALKPHOS 55 10/10/2023 0402   BILITOT 1.2 10/10/2023 0402   BILITOT 0.2 10/07/2020 0925   GFRNONAA >60 10/16/2023 0318   GFRAA 81 08/17/2019 1238   Lipase     Component Value Date/Time   LIPASE 30 12/22/2022 1903       Studies/Results: DG Chest Port 1 View Result Date: 10/16/2023 CLINICAL DATA:  Shortness of breath EXAM: PORTABLE CHEST 1 VIEW COMPARISON:  Oct 08, 2023 FINDINGS: The heart size and mediastinal contours are within normal limits. Both lungs are clear. The visualized skeletal structures are unremarkable. IMPRESSION: No active disease. Electronically Signed   By: Fredrich Jefferson M.D.   On: 10/16/2023 08:15   DG Abd Portable 1V Result Date: 10/14/2023 CLINICAL DATA:  Postoperative ileus EXAM: PORTABLE ABDOMEN - 1 VIEW COMPARISON:  Abdominal x-ray 10/19/2020 FINDINGS: There is gaseous distention of the colon and central small bowel. There is no evidence for bowel obstruction. No dilated bowel loops are seen. Cholecystectomy clips are present. No free air. Lung bases are clear. No acute osseous abnormality. IMPRESSION: Nonobstructive bowel gas pattern. Electronically Signed   By: Tyron Gallon M.D.   On: 10/14/2023 17:26    Anti-infectives: Anti-infectives (From admission, onward)  Start     Dose/Rate Route Frequency Ordered Stop   10/12/23 1400  ceFEPIme  (MAXIPIME ) 2 g in sodium chloride  0.9 % 100 mL IVPB       Note to Pharmacy: Pharmacy may adjust dosing strength, schedule, rate of infusion, etc as needed to optimize therapy   2 g 200 mL/hr over 30 Minutes Intravenous Every 8 hours 10/12/23 1036 10/17/23 1359   10/09/23 0800  metroNIDAZOLE  (FLAGYL ) IVPB 500 mg        500 mg 100 mL/hr over 60 Minutes Intravenous Every 12 hours 10/08/23 2220 10/16/23 0724   10/09/23 0000  cefTRIAXone  (ROCEPHIN ) 2 g in sodium chloride  0.9 % 100 mL IVPB  Status:  Discontinued        2 g 200 mL/hr over 30 Minutes Intravenous Every 24 hours 10/08/23  2220 10/12/23 1036   10/08/23 1730  aztreonam  (AZACTAM ) 2 g in sodium chloride  0.9 % 100 mL IVPB        2 g 200 mL/hr over 30 Minutes Intravenous  Once 10/08/23 1724 10/08/23 1812   10/08/23 1730  metroNIDAZOLE  (FLAGYL ) IVPB 500 mg        500 mg 100 mL/hr over 60 Minutes Intravenous  Once 10/08/23 1724 10/08/23 1854   10/08/23 1730  vancomycin  (VANCOCIN ) IVPB 1000 mg/200 mL premix        1,000 mg 200 mL/hr over 60 Minutes Intravenous  Once 10/08/23 1724 10/08/23 1954        Assessment/Plan Septic shock secondary to Sigmoid Diverticulitis with purulent ascites and peritonitis  POD6 s/p exploratory laparotomy, sigmoid colectomy, end colostomy Dr. 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

## 2023-10-16 NOTE — Progress Notes (Signed)
 PROGRESS NOTE        PATIENT DETAILS Name: Vicki [name] Age: 58 y.o. Sex: female Date of Birth: 1965/07/11 Admit Date: 10/08/2023 Admitting Physician Lena Qualia, MD [id number], Schuyler Custard, FNP  Brief Summary: Patient is a 58 y.o.  female with history of COPD on 4 L of oxygen at home-presented with abdominal pain secondary to sigmoid diverticulosis, hospital course complicated by development of septic shock requiring pressors.  She subsequently developed peritonitis and was taken to the operating room where she was found to have perforated diverticulitis with purulent peritonitis and underwent Hartman's procedure.    Significant events: 5/27>> admit to [facility]-severe sepsis secondary to sigmoid diverticulitis-BP soft-PCCM consulted-keep on floor. 5/28>> persistent hypotension-septic shock-transferred to ICU 5/29>> peritonitis-persistent septic shock-2 OR-perforated diverticulitis with purulent peritonitis-underwent Hartman's procedure. 5/31>> transferred back to [facility]  Significant studies: 5/27>> CT abdomen/pelvis: Severe sigmoid diverticulitis without associated abscess/perforation.  Significant microbiology data: 5/27>> COVID/influenza/RSV PCR: Negative 5/27>> stool C. difficile studies: Negative 5/27>> blood cultures: Negative  Procedures: 5/29>> Hartman's procedure  Consults: PCCM CCS  Subjective:  Patient in bed, appears comfortable, denies any headache, no fever, no chest pain or pressure, no shortness of breath , mild generalized abdominal pain. No new focal weakness.   Objective: Vitals: Blood pressure 100/71, pulse 84, temperature 97.8 F (36.6 C), temperature source Oral, resp. rate 16, height 5\' 3"  (1.6 m), weight 90.7 kg, SpO2 95%.   Exam:  Awake Alert, No new F.N deficits, Normal affect [place].AT,PERRAL Supple Neck, No JVD,   Symmetrical Chest wall movement, Good air movement bilaterally, CTAB RRR,No Gallops, Rubs or new Murmurs,   +ve B.Sounds, Abd Soft, colostomy bag in place. No Cyanosis, Clubbing or edema    Assessment/Plan:  Septic shock secondary to sigmoid diverticulitis with perforation resulting in peritonitis-now s/p Hartman's procedure 5/29. Sepsis physiology has resolved Required pressors while in the ICU Remains on cefepime  General Surgery following and directing postop care.  With liquid stool have noted a mild increase in her WBC count and procalcitonin on 10/16/2023, general surgery to evaluate May require repeat abdominal imaging, for now we will check UA and chest x-ray as well.  Continue cefepime .  Acute on chronic hypoxic/hypercapnic respiratory failure (on 4 L of oxygen at home) Worsening hypoxia-secondary to sepsis/pain-causing splitting-background of COPD Improving on 4 L nasal cannula oxygen, encouraged to sit in chair use I-S and flutter valve.  On home oxygen as well.  Normocytic anemia Secondary to critical illness Follow CBC and transfuse if significant drop  COPD Stable Continue bronchodilators  HTN BP stable without the use of any antihypertensives Resume amlodipine  when able  Hypomagnesemia.  Replaced.    Hyponatremia.  Likely dehydration improving with IV fluids.  Anxiety/depression Resume Celexa  when oral intake is more stable.  GERD PPI  EtOH abuse No signs of alcohol withdrawal Continue to monitor closely  Tobacco abuse Counseled  Liver lesion - isodense lesion noted on CT, nonemergent MRI recommended outpt per PCP   Class 1 Obesity: Estimated body mass index is 35.42 kg/m as calculated from the following:   Height as of this encounter: 5\' 3"  (1.6 m).   Weight as of this encounter: 90.7 kg.   Code status:   Code Status: Full Code   DVT Prophylaxis: Place TED hose Start: 10/15/23 0908 enoxaparin  (LOVENOX ) injection 40 mg Start: 10/11/23 1000 SCDs Start: 10/09/23 1722  Family Communication: None at bedside   Disposition Plan: Status is:  Inpatient Remains inpatient appropriate because: Severity of illness   Planned Discharge Destination:Home health   Diet: Diet Order             DIET SOFT Room service appropriate? Yes; Fluid consistency: Thin  Diet effective now                   MEDICATIONS: Scheduled Meds:  acetaminophen   1,000 mg Oral Q6H   budesonide -glycopyrrolate -formoterol   2 puff Inhalation BID   enoxaparin  (LOVENOX ) injection  40 mg Subcutaneous Q24H   guaiFENesin   600 mg Oral BID   methocarbamol   1,000 mg Oral TID   midodrine   10 mg Oral TID WC   pantoprazole   40 mg Oral BID   Ensure Max Protein  11 oz Oral BID   sodium chloride  flush  3 mL Intravenous Q12H   SMOG  200 mL Rectal Once   thiamine   100 mg Oral Daily   Continuous Infusions:  ceFEPime  (MAXIPIME ) IV 2 g (10/16/23 0720)   magnesium  sulfate bolus IVPB 4 g (10/16/23 0724)   PRN Meds:.albuterol , ALPRAZolam , azelastine , HYDROmorphone  (DILAUDID ) injection, melatonin, [DISCONTINUED] ondansetron  **OR** ondansetron  (ZOFRAN ) IV, oxyCODONE , prochlorperazine , SUMAtriptan    I have personally reviewed following labs and imaging studies   Data Review:   DVT Prophylaxis  Place TED hose Start: 10/15/23 0908 enoxaparin  (LOVENOX ) injection 40 mg Start: 10/11/23 1000 SCDs Start: 10/09/23 1722  Recent Labs  Lab 10/09/23 1907 10/10/23 0402 10/10/23 1524 10/12/23 0824 10/13/23 0833 10/14/23 0337 10/15/23 0318 10/16/23 0318  WBC 4.5 17.1*   < > 11.0* 7.9 10.1 11.6* 16.4*  HGB 10.0* 9.9*   < > 9.0* 9.9* 9.9* 9.0* 9.4*  HCT 30.8* 30.1*   < > 26.5* 29.9* 29.3* 26.6* 28.6*  PLT 129* 200   < > 217 216 231 285 331  MCV 97.8 96.8   < > 95.0 94.9 94.8 94.7 95.0  MCH 31.7 31.8   < > 32.3 31.4 32.0 32.0 31.2  MCHC 32.5 32.9   < > 34.0 33.1 33.8 33.8 32.9  RDW 13.1 13.2   < > 13.2 13.3 13.2 13.4 13.6  LYMPHSABS 0.3* 0.7  --   --   --  1.2 1.7 0.7  MONOABS 0.3 0.5  --   --   --  1.6* 1.7* 3.1*  EOSABS 0.1 0.0  --   --   --  0.4 0.5 0.3  BASOSABS  0.0 0.2*  --   --   --  0.0 0.0 0.0   < > = values in this interval not displayed.    Recent Labs  Lab 10/09/23 1907 10/09/23 2108 10/10/23 0019 10/10/23 0402 10/10/23 1524 10/12/23 1610 10/13/23 9604 10/14/23 0337 10/15/23 0318 10/16/23 0318  NA 135  --   --  133*   < > 134* 133* 132* 131* 133*  K 3.3*  --   --  3.6   < > 3.7 3.7 3.7 3.9 3.9  CL 100  --   --  101   < > 99 98 97* 99 102  CO2 16*  --   --  23   < > 26 24 25 29 25   ANIONGAP 19*  --   --  9   < > 9 11 10  3* 6  GLUCOSE 94  --   --  108*   < > 74 84 96 106* 92  BUN 5*  --   --  <  5*   < > 15 10 6  <5* 6  CREATININE 0.83  --   --  0.79   < > 0.64 0.56 0.55 0.55 0.63  AST 41  --   --  34  --   --   --   --   --   --   ALT 11  --   --  12  --   --   --   --   --   --   ALKPHOS 74  --   --  55  --   --   --   --   --   --   BILITOT 1.4*  --   --  1.2  --   --   --   --   --   --   ALBUMIN  2.5*  --   --  2.5*  --   --   --   --   --   --   PROCALCITON  --  3.64  --   --   --   --   --   --   --  7.47  LATICACIDVEN  --  5.2* 4.5*  --   --   --   --   --   --   --   MG  --  2.5*  --   --   --   --   --  1.2* 1.8 1.5*  PHOS 2.6  --   --  2.5  --   --   --  2.8 2.5 2.6  CALCIUM  7.4*  --   --  7.5*   < > 7.7* 8.0* 7.7* 7.4* 7.6*   < > = values in this interval not displayed.     Recent Labs  Lab 10/09/23 2108 10/10/23 0019 10/10/23 0402 10/12/23 1610 10/13/23 9604 10/14/23 0337 10/15/23 0318 10/16/23 0318  PROCALCITON 3.64  --   --   --   --   --   --  7.47  LATICACIDVEN 5.2* 4.5*  --   --   --   --   --   --   MG 2.5*  --   --   --   --  1.2* 1.8 1.5*  CALCIUM   --   --    < > 7.7* 8.0* 7.7* 7.4* 7.6*   < > = values in this interval not displayed.    Radiology Reports  DG Abd Portable 1V Result Date: 10/14/2023 CLINICAL DATA:  Postoperative ileus EXAM: PORTABLE ABDOMEN - 1 VIEW COMPARISON:  Abdominal x-ray 10/19/2020 FINDINGS: There is gaseous distention of the colon and central small bowel. There is no  evidence for bowel obstruction. No dilated bowel loops are seen. Cholecystectomy clips are present. No free air. Lung bases are clear. No acute osseous abnormality. IMPRESSION: Nonobstructive bowel gas pattern. Electronically Signed   By: Tyron Gallon M.D.   On: 10/14/2023 17:26      Signature  -   Lynnwood Sauer M.D on 10/16/2023 at 7:45 AM   -  To page go to [email or web address]

## 2023-10-16 NOTE — Plan of Care (Signed)
 progressing

## 2023-10-16 NOTE — Progress Notes (Signed)
 PT Cancellation Note  Patient Details Name: Vicki [name] MRN: [id number] DOB: Aug 14, 1965   Cancelled Treatment:    Reason Eval/Treat Not Completed: Patient declined, no reason specified (Pt refused politely asking PT to come back later.)   Suleyma Wafer 10/16/2023, 10:47 AM

## 2023-10-17 DIAGNOSIS — K5720 Diverticulitis of large intestine with perforation and abscess without bleeding: Secondary | ICD-10-CM | POA: Diagnosis not present

## 2023-10-17 LAB — CBC WITH DIFFERENTIAL/PLATELET
Abs Immature Granulocytes: 0 10*3/uL (ref 0.00–0.07)
Basophils Absolute: 0.1 10*3/uL (ref 0.0–0.1)
Basophils Relative: 1 %
Eosinophils Absolute: 0.5 10*3/uL (ref 0.0–0.5)
Eosinophils Relative: 4 %
HCT: 31 % — ABNORMAL LOW (ref 36.0–46.0)
Hemoglobin: 10.2 g/dL — ABNORMAL LOW (ref 12.0–15.0)
Lymphocytes Relative: 11 %
Lymphs Abs: 1.5 10*3/uL (ref 0.7–4.0)
MCH: 31.2 pg (ref 26.0–34.0)
MCHC: 32.9 g/dL (ref 30.0–36.0)
MCV: 94.8 fL (ref 80.0–100.0)
Monocytes Absolute: 0.8 10*3/uL (ref 0.1–1.0)
Monocytes Relative: 6 %
Neutro Abs: 10.3 10*3/uL — ABNORMAL HIGH (ref 1.7–7.7)
Neutrophils Relative %: 78 %
Platelets: 350 10*3/uL (ref 150–400)
RBC: 3.27 MIL/uL — ABNORMAL LOW (ref 3.87–5.11)
RDW: 13.9 % (ref 11.5–15.5)
WBC: 13.2 10*3/uL — ABNORMAL HIGH (ref 4.0–10.5)
nRBC: 0 % (ref 0.0–0.2)
nRBC: 0 /100{WBCs}

## 2023-10-17 LAB — COMPREHENSIVE METABOLIC PANEL WITH GFR
ALT: 9 U/L (ref 0–44)
AST: 25 U/L (ref 15–41)
Albumin: 1.7 g/dL — ABNORMAL LOW (ref 3.5–5.0)
Alkaline Phosphatase: 120 U/L (ref 38–126)
Anion gap: 7 (ref 5–15)
BUN: 6 mg/dL (ref 6–20)
CO2: 24 mmol/L (ref 22–32)
Calcium: 7.8 mg/dL — ABNORMAL LOW (ref 8.9–10.3)
Chloride: 101 mmol/L (ref 98–111)
Creatinine, Ser: 0.52 mg/dL (ref 0.44–1.00)
GFR, Estimated: >60 mL/min (ref >60)
Glucose, Bld: 127 mg/dL — ABNORMAL HIGH (ref 70–99)
Potassium: 3.6 mmol/L (ref 3.5–5.1)
Sodium: 132 mmol/L — ABNORMAL LOW (ref 135–145)
Total Bilirubin: 0.7 mg/dL (ref 0.0–1.2)
Total Protein: 5.2 g/dL — ABNORMAL LOW (ref 6.5–8.1)

## 2023-10-17 LAB — C-REACTIVE PROTEIN: CRP: 12.5 mg/dL — ABNORMAL HIGH (ref <1.0)

## 2023-10-17 LAB — GLUCOSE, CAPILLARY
Glucose-Capillary: 116 mg/dL — ABNORMAL HIGH (ref 70–99)
Glucose-Capillary: 129 mg/dL — ABNORMAL HIGH (ref 70–99)
Glucose-Capillary: 92 mg/dL (ref 70–99)
Glucose-Capillary: 98 mg/dL (ref 70–99)

## 2023-10-17 LAB — PROCALCITONIN: Procalcitonin: 4.13 ng/mL

## 2023-10-17 LAB — MAGNESIUM: Magnesium: 1.6 mg/dL — ABNORMAL LOW (ref 1.7–2.4)

## 2023-10-17 LAB — PHOSPHORUS: Phosphorus: 2.2 mg/dL — ABNORMAL LOW (ref 2.5–4.6)

## 2023-10-17 MED ORDER — MAGNESIUM SULFATE 4 GM/100ML IV SOLN
4.0000 g | Freq: Once | INTRAVENOUS | Status: AC
  Administered 2023-10-17: 4 g via INTRAVENOUS
  Filled 2023-10-17: qty 100

## 2023-10-17 NOTE — Progress Notes (Signed)
 Physical Therapy Treatment Patient Details Name: Vicki [name] MRN: [id number] DOB: 09/29/1965 Today's Date: 10/17/2023   History of Present Illness 58 yo female admitted 10/08/23 with pelvic and lower back pain, diarrhea, sepsis due to diverticulitis. 5/29 Ex lap with sigmoid colectomy, end colostomy. 5/29 extubated.  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    PT Comments  Progressing towards goals, eager to get OOB. Encouraged OOB several times daily with staff assist. Ambulated in room with RW and close CGA. Distance limited by pain and later nausea. SpO2 90-91% on 4L while ambulating HR 113. BP 106/79 sitting on EOB. Reviewed LE exercises, encouraged IS use regularly. Patient will continue to benefit from skilled physical therapy services to further improve independence with functional mobility.    If plan is discharge home, recommend the following: A little help with walking and/or transfers;A little help with bathing/dressing/bathroom;Assistance with cooking/housework;Assist for transportation;Help with stairs or ramp for entrance   Can travel by private vehicle        Equipment Recommendations  BSC/3in1    Recommendations for Other Services       Precautions / Restrictions Precautions Precautions: Fall Recall of Precautions/Restrictions: Impaired Precaution/Restrictions Comments: watch sats, desats with all movement, open abdomen, ostomy Restrictions Weight Bearing Restrictions Per Provider Order: No     Mobility  Bed Mobility Overal bed mobility: Needs Assistance Bed Mobility: Rolling, Sidelying to Sit Rolling: Used rails, Contact guard assist Sidelying to sit: Min assist, HOB elevated, Used rails       General bed mobility comments: CGA to roll with cues for rail use. Min assist for trunk support and LEs to scoot to EOB.    Transfers Overall transfer level: Needs assistance Equipment used: Rolling walker (2 wheels) Transfers: Sit to/from Stand Sit  to Stand: Min assist, From elevated surface           General transfer comment: Initially pulled herself forward creating an unsafe proximity to EOB. Reviewed techniques, bed elevated slightly, and able to rise correctly with min assist for boost. Trunk flexed due to pain, takes a moment to relax and extend trunk upright.    Ambulation/Gait Ambulation/Gait assistance: Contact guard assist Gait Distance (Feet): 25 Feet Assistive device: Rolling walker (2 wheels) Gait Pattern/deviations: Step-through pattern, Decreased stride length, Trunk flexed Gait velocity: dec Gait velocity interpretation: <1.31 ft/sec, indicative of household ambulator   General Gait Details: CGA for safety, slow and guarded but without evidence of LOB or buckling. Cues for proximity to device and upright posture as tolerated. SpO2 90-91% while ambulating on 4L supplemental O2, HR 113. Nauseated end of distance.   Stairs             Wheelchair Mobility     Tilt Bed    Modified Rankin (Stroke Patients Only)       Balance Overall balance assessment: Needs assistance Sitting-balance support: Feet supported Sitting balance-Leahy Scale: Good     Standing balance support: Bilateral upper extremity supported Standing balance-Leahy Scale: Poor Standing balance comment: RW in standing                            Communication Communication Communication: No apparent difficulties  Cognition Arousal: Alert Behavior During Therapy: WFL for tasks assessed/performed   PT - Cognitive impairments: No apparent impairments                         Following  commands: Intact      Cueing Cueing Techniques: Verbal cues  Exercises General Exercises - Lower Extremity Ankle Circles/Pumps: Both, 10 reps, AROM, Seated Long Arc Quad: Strengthening, Both, 10 reps, Seated Hip Flexion/Marching: Strengthening, Both, 5 reps, Standing Mini-Sqauts: Strengthening, Both, 5 reps, Standing     General Comments General comments (skin integrity, edema, etc.): Wound vac noted to be turned off when PT entered room. RN notified, powered on. Functioning remainder of session. BP supine 91/69 HR 85, seated BP 106/79 HR 113. SpO2 96% on 4L at rest.      Pertinent Vitals/Pain Pain Assessment Pain Assessment: Faces Faces Pain Scale: Hurts even more Pain Location: abdomen with movement Pain Descriptors / Indicators: Guarding, Grimacing, Sore    Home Living                          Prior Function            PT Goals (current goals can now be found in the care plan section) Acute Rehab PT Goals Patient Stated Goal: return home PT Goal Formulation: With patient Time For Goal Achievement: 10/25/23 Potential to Achieve Goals: Good Progress towards PT goals: Progressing toward goals    Frequency    Min 2X/week      PT Plan      Co-evaluation              AM-PAC PT "6 Clicks" Mobility   Outcome Measure  Help needed turning from your back to your side while in a flat bed without using bedrails?: A Little Help needed moving from lying on your back to sitting on the side of a flat bed without using bedrails?: A Little Help needed moving to and from a bed to a chair (including a wheelchair)?: A Little Help needed standing up from a chair using your arms (e.g., wheelchair or bedside chair)?: A Little Help needed to walk in hospital room?: A Little Help needed climbing 3-5 steps with a railing? : A Lot 6 Click Score: 17    End of Session Equipment Utilized During Treatment: Oxygen Activity Tolerance: Patient tolerated treatment well Patient left: in chair;with call bell/phone within reach;with chair alarm set;with SCD's reapplied Nurse Communication: Mobility status (wound vac, nausea.) PT Visit Diagnosis: Other abnormalities of gait and mobility (R26.89);Difficulty in walking, not elsewhere classified (R26.2);Unsteadiness on feet (R26.81);Muscle weakness  (generalized) (M62.81);Pain Pain - part of body:  (abdomen)     Time: 0981-1914 PT Time Calculation (min) (ACUTE ONLY): 24 min  Charges:    $Gait Training: 8-22 mins $Therapeutic Activity: 8-22 mins PT General Charges $$ ACUTE PT VISIT: 1 Visit                     Jory Ng, PT, DPT [facility]  Rehabilitation Services Physical Therapist Office: [phone] Website: [email or web address]    Alinda Irani 10/17/2023, 2:26 PM

## 2023-10-17 NOTE — Progress Notes (Signed)
 Progress Note  7 Days Post-Op  Subjective: Pt having more stool and gas from ostomy but still some crampy gas pain. Discussed CT AP negative for acute post-operative complication or abscess.   Objective: Vital signs in last 24 hours: Temp:  [97.3 F (36.3 C)-97.9 F (36.6 C)] 97.9 F (36.6 C) (06/05 0800) Pulse Rate:  [65-78] 72 (06/05 0800) Resp:  [16-22] 18 (06/05 0800) BP: (80-103)/(52-72) 93/62 (06/05 0800) SpO2:  [92 %-97 %] 96 % (06/05 0800) Last BM Date : 10/16/23  Intake/Output from previous day: 06/04 0701 - 06/05 0700 In: 2026.4 [P.O.:960; I.V.:466.4; IV Piggyback:600] Out: 1725 [Urine:1450; Drains:50; Stool:225] Intake/Output this shift: Total I/O In: -  Out: 800 [Urine:800]  PE: General: WD, obese female, NAD  HEENT: Sclera anicteric Heart: RRR Lungs: on 4L via [place] which is baseline, normal effot  Abd: mildly distended, appropriately ttp, midline wound with VAC present, ostomy viable with gas and liquid stool in bag Psych: A&Ox3 with an appropriate affect.    Lab Results:  Recent Labs    10/16/23 0318 10/17/23 0920  WBC 16.4* 13.2*  HGB 9.4* 10.2*  HCT 28.6* 31.0*  PLT 331 350   BMET Recent Labs    10/15/23 0318 10/16/23 0318  NA 131* 133*  K 3.9 3.9  CL 99 102  CO2 29 25  GLUCOSE 106* 92  BUN <5* 6  CREATININE 0.55 0.63  CALCIUM  7.4* 7.6*   PT/INR No results for input(s): "LABPROT", "INR" in the last 72 hours.  CMP     Component Value Date/Time   NA 133 (L) 10/16/2023 0318   NA 140 10/07/2020 0925   K 3.9 10/16/2023 0318   CL 102 10/16/2023 0318   CO2 25 10/16/2023 0318   GLUCOSE 92 10/16/2023 0318   BUN 6 10/16/2023 0318   BUN 12 10/07/2020 0925   CREATININE 0.63 10/16/2023 0318   CREATININE 0.76 04/25/2023 1136   CALCIUM  7.6 (L) 10/16/2023 0318   PROT 5.0 (L) 10/10/2023 0402   PROT 7.0 10/07/2020 0925   ALBUMIN  2.5 (L) 10/10/2023 0402   ALBUMIN  4.3 10/07/2020 0925   AST 34 10/10/2023 0402   ALT 12 10/10/2023 0402    ALKPHOS 55 10/10/2023 0402   BILITOT 1.2 10/10/2023 0402   BILITOT 0.2 10/07/2020 0925   GFRNONAA >60 10/16/2023 0318   GFRAA 81 08/17/2019 1238   Lipase     Component Value Date/Time   LIPASE 30 12/22/2022 1903       Studies/Results: CT ABDOMEN PELVIS W CONTRAST Result Date: 10/16/2023 CLINICAL DATA:  Abdominal pain postop EXAM: CT ABDOMEN AND PELVIS WITH CONTRAST TECHNIQUE: Multidetector CT imaging of the abdomen and pelvis was performed using the standard protocol following bolus administration of intravenous contrast. RADIATION DOSE REDUCTION: This exam was performed according to the departmental dose-optimization program which includes automated exposure control, adjustment of the mA and/or kV according to patient size and/or use of iterative reconstruction technique. CONTRAST:  75mL OMNIPAQUE  IOHEXOL  350 MG/ML SOLN COMPARISON:  Oct 08, 2023 FINDINGS: Lower chest: Small bilateral pleural effusion with bilateral basilar infiltrates and atelectasis. Hepatobiliary: Small amount of ascites. No focal hepatic lesions. Prior cholecystectomy, no biliary dilatation. Pancreas: Unremarkable. No pancreatic ductal dilatation or surrounding inflammatory changes. Spleen: Normal in size without focal abnormality. Adrenals/Urinary Tract: Adrenal glands are unremarkable. Kidneys are normal, without renal calculi, focal lesion, or hydronephrosis. Bladder is unremarkable. Stomach/Bowel: No small or large bowel obstruction. There is small amount of ascites with some bowel edema involving several bowel  loops in the left midportion of the abdomen better seen from images 27-48. Edema may be secondary to the ascites. 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 No obstruction. No organized intra-abdominal abscesses or drainable fluid collections. Vascular/Lymphatic: No significant vascular findings are present. No enlarged abdominal or pelvic lymph nodes. Reproductive: Status post hysterectomy. No adnexal masses. Other:  No abdominal wall hernia or abnormality. No abdominopelvic ascites. Nonspecific edema and stranding of the anterior abdominal wall without evidence of organized fluid collections. Musculoskeletal: No acute or significant osseous findings. IMPRESSION: *Small bilateral pleural effusion with bilateral basilar infiltrates and atelectasis. *Small amount of ascites. 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 No obstruction. No organized intra-abdominal abscesses or drainable fluid collections. *Nonspecific edema and stranding of the anterior abdominal wall without evidence of organized fluid collections. *There is small amount of ascites with some bowel edema involving several bowel loops in the left midportion of the abdomen better seen from. Edema may be secondary to the ascites. Electronically Signed   By: Fredrich Jefferson M.D.   On: 10/16/2023 14:21   DG Chest Port 1 View Result Date: 10/16/2023 CLINICAL DATA:  Shortness of breath EXAM: PORTABLE CHEST 1 VIEW COMPARISON:  Oct 08, 2023 FINDINGS: The heart size and mediastinal contours are within normal limits. Both lungs are clear. The visualized skeletal structures are unremarkable. IMPRESSION: No active disease. Electronically Signed   By: Fredrich Jefferson M.D.   On: 10/16/2023 08:15    Anti-infectives: Anti-infectives (From admission, onward)    Start     Dose/Rate Route Frequency Ordered Stop   10/12/23 1400  ceFEPIme  (MAXIPIME ) 2 g in sodium chloride  0.9 % 100 mL IVPB       Note to Pharmacy: Pharmacy may adjust dosing strength, schedule, rate of infusion, etc as needed to optimize therapy   2 g 200 mL/hr over 30 Minutes Intravenous Every 8 hours 10/12/23 1036 10/17/23 0705   10/09/23 0800  metroNIDAZOLE  (FLAGYL ) IVPB 500 mg        500 mg 100 mL/hr over 60 Minutes Intravenous Every 12 hours 10/08/23 2220 10/16/23 0724   10/09/23 0000  cefTRIAXone  (ROCEPHIN ) 2 g in sodium chloride  0.9 % 100 mL IVPB  Status:  Discontinued        2 g 200 mL/hr over 30  Minutes Intravenous Every 24 hours 10/08/23 2220 10/12/23 1036   10/08/23 1730  aztreonam  (AZACTAM ) 2 g in sodium chloride  0.9 % 100 mL IVPB        2 g 200 mL/hr over 30 Minutes Intravenous  Once 10/08/23 1724 10/08/23 1812   10/08/23 1730  metroNIDAZOLE  (FLAGYL ) IVPB 500 mg        500 mg 100 mL/hr over 60 Minutes Intravenous  Once 10/08/23 1724 10/08/23 1854   10/08/23 1730  vancomycin  (VANCOCIN ) IVPB 1000 mg/200 mL premix        1,000 mg 200 mL/hr over 60 Minutes Intravenous  Once 10/08/23 1724 10/08/23 1954        Assessment/Plan Septic shock secondary to Sigmoid Diverticulitis with purulent ascites and peritonitis  POD7 s/p exploratory laparotomy, sigmoid colectomy, end colostomy Dr. 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

## 2023-10-17 NOTE — Consult Note (Signed)
 WOC Nurse Consult Note: Reason for Consult: Apply Vac dressing to the abdominal suture Wound type: Surgical dehiscence. Pressure Injury POA: NA Measurement: 14 x 4 x 2 cm Wound bed: 100% pink granulated tissue Drainage (amount, consistency, odor) Minimum amount, 150 ml on canister at 1330, no odor. Serous Periwound: Intact Dressing procedure/placement/frequency:   Removed old NPWT dressing Cleansed wound with normal saline Periwound skin protected with window framed with drape   Filled wound with 2 piece of black foam. Sealed NPWT dressing at HG/146mmHG  Patient received IV pain medication per bedside nurse prior to dressing change Patient tolerated procedure well WOC nurse will continue to provide NPWT dressing changed due to the complexity of the dressing change.    WOC Nurse ostomy follow up Stoma type/location: LLQ Colostomy Stomal assessment/size: 30 mm round. Stoma red and viable, on the skin level. Peristomal assessment: intact. Treatment options for stomal/peristomal skin: ring #[id number], belt #621 Output 100 ml of soft stools. Ostomy pouching: 2pc. #[id number] and bag #[id number]. Education provided:  Provide educational ostomy care for the patient and her husband with hands on.   - The pouch need to be change twice per week or if is leaking. - Clean the skin with soup and water, or just water. Keep in mind that any product can stay at the skin before apply the next pouch to have a better seal.   - Cut the barrier with the size and shape as the ostomy has. - Take the protect plastic off the barrier. That can be use as a model for the next pouch system. - Apply the ring surrounding the cut;  - Apply on the skin, stretching a little the bottom of the ostomy site to apply correctly. - The warmed skin will interact with the barrier, making it sealed.   - To empty the pouch when becomes 1/3 full. - Teach how to open and close the lock in roll system (the patient's wife did with no  help. - A bag with filter will provide a release the gas without odor, does not need to protect against the water. - The pt can take a shower normally regarding her ostomy.  Enrolled patient in [place] [employer] Discharge program: Yes  Ordered 5 kits of ostomy supplies to home.   WOC team will follow THURS.   Please reconsult if further assistance is needed. Thank-you,  Rachel Budds BSN, RN, [employer], WOC  (Pager: [phone])

## 2023-10-17 NOTE — Discharge Instructions (Addendum)
 [facility]      [facility], [place] [phone]  OPEN ABDOMINAL SURGERY: POST OP INSTRUCTIONS  Always review your discharge instruction sheet given to you by the facility where your surgery was performed.  IF YOU HAVE DISABILITY OR FAMILY LEAVE FORMS, YOU MUST BRING THEM TO THE OFFICE FOR PROCESSING.  PLEASE DO NOT GIVE THEM TO YOUR DOCTOR.  A prescription for pain medication may be given to you upon discharge.  Take your pain medication as prescribed, if needed.  If narcotic pain medicine is not needed, then you may take acetaminophen  (Tylenol ) or ibuprofen  (Advil ) as needed. Take your usually prescribed medications unless otherwise directed. If you need a refill on your pain medication, please contact your pharmacy. They will contact our office to request authorization.  Prescriptions will not be filled after 5pm or on week-ends. You should follow a light diet the first few days after arrival home, such as soup and crackers, pudding, etc.unless your doctor has advised otherwise. A high-fiber, low fat diet can be resumed as tolerated.   Be sure to include lots of fluids daily. Most patients will experience some swelling and bruising on the chest and neck area.  Ice packs will help.  Swelling and bruising can take several days to resolve Most patients will experience some swelling and bruising in the area of the incision. Ice pack will help. Swelling and bruising can take several days to resolve..  It is common to experience some constipation if taking pain medication after surgery.  Increasing fluid intake and taking a stool softener will usually help or prevent this problem from occurring.  A mild laxative (Milk of Magnesia or Miralax ) should be taken according to package directions if there are no bowel movements after 48 hours.  You may have steri-strips (small skin tapes) in place directly over the incision.  These strips should be left on the skin for 7-10 days.  If your surgeon used skin  glue on the incision, you may shower in 24 hours.  The glue will flake off over the next 2-3 weeks.  Any sutures or staples will be removed at the office during your follow-up visit. You may find that a light gauze bandage over your incision may keep your staples from being rubbed or pulled. You may shower and replace the bandage daily. ACTIVITIES:  You may resume regular (light) daily activities beginning the next day--such as daily self-care, walking, climbing stairs--gradually increasing activities as tolerated.  You may have sexual intercourse when it is comfortable.  Refrain from any heavy lifting or straining until approved by your doctor. You may drive when you no longer are taking prescription pain medication, you can comfortably wear a seatbelt, and you can safely maneuver your car and apply brakes  You should see your doctor in the office for a follow-up appointment approximately two weeks after your surgery.  Make sure that you call for this appointment within a day or two after you arrive home to insure a convenient appointment time.   WHEN TO CALL YOUR DOCTOR: Fever over 101.0 Inability to urinate Nausea and/or vomiting Extreme swelling or bruising Continued bleeding from incision. Increased pain, redness, or drainage from the incision. Difficulty swallowing or breathing Muscle cramping or spasms. Numbness or tingling in hands or feet or around lips.  The clinic staff is available to answer your questions during regular business hours.  Please don't hesitate to call and ask to speak to one of the nurses if you have concerns.  For further questions, please visit [email or web address]   #######################################################  Ostomy Support Information  You've heard that people get along just fine with only one of their eyes, or one of their lungs, or one of their kidneys. But you also know that you have only one intestine and only one bladder, and that  leaves you feeling awfully empty, both physically and emotionally: You think no other people go around without part of their intestine with the ends of their intestines sticking out through their abdominal walls.   YOU ARE NOT ALONE.  There are nearly three quarters of a million people in the [place]  who have an ostomy; people who have had surgery to remove all or part of their colons or bladders.   There is even a national association, the [place] [employer] [place] with over 350 local affiliated support groups that are organized by volunteers who provide peer support and counseling. [employer] has a toll free telephone num-ber, [phone] and an educational, interactive website, www.ostomy.org   An ostomy is an opening in the belly (abdominal wall) made by surgery. Ostomates are people who have had this procedure. The opening (stoma) allows the kidney or bowel to grdischarge waste. An external pouch covers the stoma to collect waste. Pouches are are a simple bag and are odor free. Different companies have disposable or reusable pouches to fit one's lifestyle. An ostomy can either be temporary or permanent.   THERE ARE THREE MAIN TYPES OF OSTOMIES Colostomy. A colostomy is a surgically created opening in the large intestine (colon). Ileostomy. An ileostomy is a surgically created opening in the small intestine. Urostomy. A urostomy is a surgically created opening to divert urine away from the bladder.  OSTOMY Care  The following guidelines will make care of your colostomy easier. Keep this information close by for quick reference.  Helpful DIET hints Eat a well-balanced diet including vegetables and fresh fruits. Eat on a regular schedule.  Drink at least 6 to 8 glasses of fluids daily. Eat slowly in a relaxed atmosphere. Chew your food thoroughly. Avoid chewing gum, smoking, and drinking from a straw. This will help decrease the amount of air you swallow, which may help reduce gas. Eating  yogurt or drinking buttermilk may help reduce gas.  To control gas at night, do not eat after 8 p.m. This will give your bowel time to quiet down before you go to bed.  If gas is a problem, you can purchase Beano. Sprinkle Beano on the first bite of food before eating to reduce gas. It has no flavor and should not change the taste of your food. You can buy Beano over the counter at your local drugstore.  Foods like fish, onions, garlic, broccoli, asparagus, and cabbage produce odor. Although your pouch is odor-proof, if you eat these foods you may notice a stronger odor when emptying your pouch. If this is a concern, you may want to limit these foods in your diet.  If you have an ileostomy, you will have chronic diarrhea & need to drink more liquids to avoid getting dehydrated.  Consider antidiarrheal medicine like imodium  (loperamide ) or Lomotil to help slow down bowel movements / diarrhea into your ileostomy bag.  GETTING TO GOOD BOWEL HEALTH WITH AN ILEOSTOMY    With the colon bypassed & not in use, you will have small bowel diarrhea.   It is important to thicken & slow your bowel movements down.   The goal: 4-6 small BOWEL MOVEMENTS A DAY It  is important to drink plenty of liquids to avoid getting dehydrated  CONTROLLING ILEOSTOMY DIARRHEA  TAKE A FIBER SUPPLEMENT (FiberCon or Benefiner soluble fiber) twice a day - to thicken stools by absorbing excess fluid and retrain the intestines to act more normally.  Slowly increase the dose over a few weeks.  Too much fiber too soon can backfire and cause cramping & bloating.  TAKE AN IRON SUPPLEMENT twice a day to naturally constipate your bowels.  Usually ferrous sulfate 325mg  twice a day)  TAKE ANTI-DIARRHEAL MEDICINES: Loperamide  (Imodium ) can slow down diarrhea.  Start with two tablets (= 4mg ) first and then try one tablet every 6 hours.  Can go up to 2 pills four times day (8 pills of 2mg  max) Avoid if you are having fevers or severe pain.   If you are not better or start feeling worse, stop all medicines and call your doctor for advice LoMotil (Diphenoxylate / Atropine) is another medicine that can constipate & slow down bowel moevements Pepto Bismol (bismuth) can gently thicken bowels as well  If diarrhea is worse,: drink plenty of liquids and try simpler foods for a few days to avoid stressing your intestines further. Avoid dairy products (especially milk & ice cream) for a short time.  The intestines often can lose the ability to digest lactose when stressed. Avoid foods that cause gassiness or bloating.  Typical foods include beans and other legumes, cabbage, broccoli, and dairy foods.  Every person has some sensitivity to other foods, so listen to our body and avoid those foods that trigger problems for you.Call your doctor if you are getting worse or not better.  Sometimes further testing (cultures, endoscopy, X-ray studies, bloodwork, etc) may be needed to help diagnose and treat the cause of the diarrhea. Take extra anti-diarrheal medicines (maximum is 8 pills of 2mg  loperamide  a day)   Tips for POUCHING an OSTOMY   Changing Your Pouch The best time to change your pouch is in the morning, before eating or drinking anything. Your stoma can function at any time, but it will function more after eating or drinking.   Applying the pouching system  Place all your equipment close at hand before removing your pouch.  Wash your hands.  Stand or sit in front of a mirror. Use the position that works best for you. Remember that you must keep the skin around the stoma wrinkle-free for a good seal.  Gently remove the used pouch (1-piece system) or the pouch and old wafer (2-piece system). Empty the pouch into the toilet. Save the closure clip to use again.  Wash the stoma itself and the skin around the stoma. Your stoma may bleed a little when being washed. This is normal. Rinse and pat dry. You may use a wash cloth or soft paper  towels (like Bounty), mild soap (like Dial, Safeguard, or [place]), and water. Avoid soaps that contain perfumes or lotions.  For a new pouch (1-piece system) or a new wafer (2-piece system), measure your stoma using the stoma guide in each box of supplies.  Trace the shape of your stoma onto the back of the new pouch or the back of the new wafer. Cut out the opening. Remove the paper backing and set it aside.  Optional: Apply a skin barrier powder to surrounding skin if it is irritated (bare or weeping), and dust off the excess. Optional: Apply a skin-prep wipe (such as Skin Prep or All-Kare) to the skin around the stoma, and let  it dry. Do not apply this solution if the skin is irritated (red, tender, or broken) or if you have shaved around the stoma. Optional: Apply a skin barrier paste (such as Stomahesive, Coloplast, or Premium) around the opening cut in the back of the pouch or wafer. Allow it to dry for 30 to 60 seconds.  Hold the pouch (1-piece system) or wafer (2-piece system) with the sticky side toward your body. Make sure the skin around the stoma is wrinkle-free. Center the opening on the stoma, then press firmly to your abdomen (Fig. 4). Look in the mirror to check if you are placing the pouch, or wafer, in the right position. For a 2-piece system, snap the pouch onto the wafer. Make sure it snaps into place securely.  Place your hand over the stoma and the pouch or wafer for about 30 seconds. The heat from your hand can help the pouch or wafer stick to your skin.  Add deodorant (such as Super Banish or Nullo) to your pouch. Other options include food extracts such as vanilla oil and peppermint extract. Add about 10 drops of the deodorant to the pouch. Then apply the closure clamp. Note: Do not use toxic  chemicals or commercial cleaning agents in your pouch. These substances may harm the stoma.  Optional: For extra seal, apply tape to all 4 sides around the pouch or wafer, as if you  were framing a picture. You may use any brand of medical adhesive tape. Change your pouch every 5 to 7 days. Change it immediately if a leak occurs.  Wash your hands afterwards.  If you are wearing a 2-piece system, you may use 2 new pouches per week and alternate them. Rinse the pouch with mild soap and warm water and hang it to dry for the next day. Apply the fresh pouch. Alternate the 2 pouches like this for a week. After a week, change the wafer and begin with 2 new pouches. Place the old pouches in a plastic bag, and put them in the trash.   LIVING WITH AN OSTOMY  Emptying Your Pouch Empty your pouch when it is one-third full (of urine, stool, and/or gas). If you wait until your pouch is fuller than this, it will be more difficult to empty and more noticeable. When you empty your pouch, either put toilet paper in the toilet bowl first, or flush the toilet while you empty the pouch. This will reduce splashing. You can empty the pouch between your legs or to one side while sitting, or while standing or stooping. If you have a 2-piece system, you can snap off the pouch to empty it. Remember that your stoma may function during this time. If you wish to rinse your pouch after you empty it, a turkey baster can be helpful. When using a baster, squirt water up into the pouch through the opening at the bottom. With a 2-piece system, you can snap off the pouch to rinse it. After rinsing  your pouch, empty it into the toilet. When rinsing your pouch at home, put a few granules of Dreft soap in the rinse water. This helps lubricate and freshen your pouch. The inside of your pouch can be sprayed with non-stick cooking oil (Pam spray). This may help reduce stool sticking to the inside of the pouch.  Bathing You may shower or bathe with your pouch on or off. Remember that your stoma may function during this time.  The materials you use to wash your  stoma and the skin around it should be clean, but they do  not need to be sterile.  Wearing Your Pouch During hot weather, or if you perspire a lot in general, wear a cover over your pouch. This may prevent a rash on your skin under the pouch. Pouch covers are sold at ostomy supply stores. Wear the pouch inside your underwear for better support. Watch your weight. Any gain or loss of 10 to 15 pounds or more can change the way your pouch fits.  Going Away From Home A collapsible cup (like those that come in travel kits) or a soft plastic squirt bottle with a pull-up top (like a travel bottle for shampoo) can be used for rinsing your pouch when you are away from home. Tilt the opening of the pouch at an upward angle when using a cup to rinse.  Carry wet wipes or extra tissues to use in public bathrooms.  Carry an extra pouching system with you at all times.  Never keep ostomy supplies in the glove compartment of your car. Extreme heat or cold can damage the skin barriers and adhesive wafers on the pouch.  When you travel, carry your ostomy supplies with you at all times. Keep them within easy reach. Do not pack ostomy supplies in baggage that will be checked or otherwise separated from you, because your baggage might be lost. If you're traveling out of the country, it is helpful to have a letter stating that you are carrying ostomy supplies as a medical necessity.  If you need ostomy supplies while traveling, look in the yellow pages of the telephone book under "Surgical Supplies." Or call the local ostomy organization to find out where supplies are available.  Do not let your ostomy supplies get low. Always order new pouches before you use the last one.  Reducing Odor Limit foods such as broccoli, cabbage, onions, fish, and garlic in your diet to help reduce odor. Each time you empty your pouch, carefully clean the opening of the pouch, both inside and outside, with toilet paper. Rinse your pouch 1 or 2 times daily after you empty it (see directions  for emptying your pouch and going away from home). Add deodorant (such as Super Banish or Nullo) to your pouch. Use air deodorizers in your bathroom. Do not add aspirin  to your pouch. Even though aspirin  can help prevent odor, it could cause ulcers on your stoma.  When to call the doctor Call the doctor if you have any of the following symptoms: Purple, black, or white stoma Severe cramps lasting more than 6 hours Severe watery discharge from the stoma lasting more than 6 hours No output from the colostomy for 3 days Excessive bleeding from your stoma Swelling of your stoma to more than 1/2-inch larger than usual Pulling inward of your stoma below skin level Severe skin irritation or deep ulcers Bulging or other changes in your abdomen  When to call your ostomy nurse Call your ostomy/enterostomal therapy (WOCN) nurse if any of the following occurs: Frequent leaking of your pouching system Change in size or appearance of your stoma, causing discomfort or problems with your pouch Skin rash or rawness Weight gain or loss that causes problems with your pouch     FREQUENTLY ASKED QUESTIONS   Why haven't you met any of these folks who have an ostomy?  Well, maybe you have! You just did not recognize them because an ostomy doesn't show. It can be kept secret if you  wish. Why, maybe some of your best friends, office associates or neighbors have an ostomy ... you never can tell. People facing ostomy surgery have many quality-of-life questions like: Will you bulge? Smell? Make noises? Will you feel waste leaving your body? Will you be a captive of the toilet? Will you starve? Be a social outcast? Get/stay married? Have babies? Easily bathe, go swimming, bend over?  OK, let's look at what you can expect:   Will you bulge?  Remember, without part of the intestine or bladder, and its contents, you should have a flatter tummy than before. You can expect to wear, with little exception, what you  wore before surgery ... and this in-cludes tight clothing and bathing suits.   Will you smell?  Today, thanks to modern odor proof pouching systems, you can walk into an ostomy support group meeting and not smell anything that is foul or offensive. And, for those with an ileostomy or colostomy who are concerned about odor when emptying their pouch, there are in-pouch deodorants that can be used to eliminate any waste odors that may exist.   Will you make noises?  Everyone produces gas, especially if they are an air-swallower. But intestinal sounds that occur from time to time are no differ-ent than a gurgling tummy, and quite often your clothing will muffle any sounds.   Will you feel the waste discharges?  For those with a colostomy or ileostomy there might be a slight pressure when waste leaves your body, but understand that the intestines have no nerve endings, so there will be no unpleasant sensations. Those with a urostomy will probably be unaware of any kidney drainage.   Will you be a captive of the toilet?  Immediately post-op you will spend more time in the bathroom than you will after your body recovers from surgery. Every person is different, but on average those with an ileostomy or urostomy may empty their pouches 4 to 6 times a day; a little  less if you have a colostomy. The average wear time between pouch system changes is 3 to 5 days and the changing process should take less than 30 minutes.   Will I need to be on a special diet? Most people return to their normal diet when they have recovered from surgery. Be sure to chew your food well, eat a well-balanced diet and drink plenty of fluids. If you experience problems with a certain food, wait a couple of weeks and try it again.  Will there be odor and noises? Pouching systems are designed to be odor-proof or odor-resistant. There are deodorants that can be used in the pouch. Medications are also available to help reduce odor.  Limit gas-producing foods and carbonated beverages. You will experience less gas and fewer noises as you heal from surgery.  How much time will it take to care for my ostomy? At first, you may spend a lot of time learning about your ostomy and how to take care of it. As you become more comfortable and skilled at changing the pouching system, it will take very little time to care for it.   Will I be able to return to work? People with ostomies can perform most jobs. As soon as you have healed from surgery, you should be able to return to work. Heavy lifting (more than 10 pounds) may be discouraged.   What about intimacy? Sexual relationships and intimacy are important and fulfilling aspects of your life. They should continue after ostomy surgery.  Intimacy-related concerns should be discussed openly between you and your partner.   Can I wear regular clothing? You do not need to wear special clothing. Ostomy pouches are fairly flat and barely noticeable. Elastic undergarments will not hurt the stoma or prevent the ostomy from functioning.   Can I participate in sports? An ostomy should not limit your involvement in sports. Many people with ostomies are runners, skiers, swimmers or participate in other active lifestyles. Talk with your caregiver first before doing heavy physical activity.  Will you starve?  Not if you follow doctor's orders at each stage of your post-op adjustment. There is no such thing as an "ostomy diet". Some people with an ostomy will be able to eat and tolerate anything; others may find diffi-culty with some foods. Each person is an individual and must determine, by trial, what is best for them. A good practice for all is to drink plenty of water.   Will you be a social outcast?  Have you met anyone who has an ostomy and is a social outcast? Why should you be the first? Only your attitude and self image will effect how you are treated. No confi-dent person is an Investment banker, corporate.     PROFESSIONAL HELP   Resources are available if you need help or have questions about your ostomy.   Specially trained nurses called Wound, Ostomy Continence Nurses (WOCN) are available for consultation in most major medical centers.  Consider getting an ostomy consult at an outpatient ostomy clinic.   [place] has an Ostomy Clinic run by an Chartered certified accountant at the [facility].  [phone]. [facility] can help set up an appointment   The [employer] ([employer]) is a group made up of many local chapters throughout the [place] . These local groups hold meetings and provide support to prospective and existing ostomates. They sponsor educational events and have qualified visitors to make personal or telephone visits. Contact the [employer]OA for the chapter nearest you and for other educational publications.  More detailed information can be found in Colostomy Guide, a publication of the [employer] (UOA). Contact UOA at 1-(309)559-6286 or visit their web site at YellowSpecialist.at. The website contains links to other sites, suppliers and resources.  Media planner Start Services: Start at the website to enlist for support.  Your Wound Ostomy (WOCN) nurse may have started this process. https://www.hollister.com/en/securestart Secure Start services are designed to support people as they live their lives with an ostomy or neurogenic bladder. Enrolling is easy and at no cost to the patient. We realize that each person's needs and life journey are different. Through Secure Start services, we want to help people live their life, their way.  #######################################################    Ostomy care as instructed to you by general surgery.  Follow with Primary MD Jenelle Mis, FNP in 7 days   Get CBC, CMP, 2 view Chest X ray -  checked next visit with your primary MD    Activity: As tolerated with Full fall precautions use  walker/cane & assistance as needed  Disposition Home   Diet: Heart Healthy    Special Instructions: If you have smoked or chewed Tobacco  in the last 2 yrs please stop smoking, stop any regular Alcohol  and or any Recreational drug use.  On your next visit with your primary care physician please Get Medicines reviewed and adjusted.  Please request your Prim.MD to go over all Hospital Tests and Procedure/Radiological results at  the follow up, please get all Hospital records sent to your Prim MD by signing hospital release before you go home.  If you experience worsening of your admission symptoms, develop shortness of breath, life threatening emergency, suicidal or homicidal thoughts you must seek medical attention immediately by calling 911 or calling your MD immediately  if symptoms less severe.  You Must read complete instructions/literature along with all the possible adverse reactions/side effects for all the Medicines you take and that have been prescribed to you. Take any new Medicines after you have completely understood and accpet all the possible adverse reactions/side effects.   Do not drive when taking Pain medications.  Do not take more than prescribed Pain, Sleep and Anxiety Medications  Wear Seat belts while driving.

## 2023-10-17 NOTE — Progress Notes (Signed)
 PROGRESS NOTE        PATIENT DETAILS Name: Vicki [name] Age: 58 y.o. Sex: female Date of Birth: 06/02/1965 Admit Date: 10/08/2023 Admitting Physician Lena Qualia, MD [id number], Schuyler Custard, FNP  Brief Summary: Patient is a 58 y.o.  female with history of COPD on 4 L of oxygen at home-presented with abdominal pain secondary to sigmoid diverticulosis, hospital course complicated by development of septic shock requiring pressors.  She subsequently developed peritonitis and was taken to the operating room where she was found to have perforated diverticulitis with purulent peritonitis and underwent Hartman's procedure.    Significant events: 5/27>> admit to [facility]-severe sepsis secondary to sigmoid diverticulitis-BP soft-PCCM consulted-keep on floor. 5/28>> persistent hypotension-septic shock-transferred to ICU 5/29>> peritonitis-persistent septic shock-2 OR-perforated diverticulitis with purulent peritonitis-underwent Hartman's procedure. 5/31>> transferred back to [facility]  Significant studies: 5/27>> CT abdomen/pelvis: Severe sigmoid diverticulitis without associated abscess/perforation.  Significant microbiology data: 5/27>> COVID/influenza/RSV PCR: Negative 5/27>> stool C. difficile studies: Negative 5/27>> blood cultures: Negative  Procedures: 5/29>> Hartman's procedure  Consults: PCCM CCS  Subjective: Patient in bed, appears comfortable, denies any headache, no fever, no chest pain or pressure, no shortness of breath , no abdominal pain. No new focal weakness.   Objective: Vitals: Blood pressure 93/62, pulse 72, temperature 97.9 F (36.6 C), temperature source Oral, resp. rate 18, height 5\' 3"  (1.6 m), weight 90.7 kg, SpO2 96%.   Exam:  Awake Alert, No new F.N deficits, Normal affect [place].AT,PERRAL Supple Neck, No JVD,   Symmetrical Chest wall movement, Good air movement bilaterally, CTAB RRR,No Gallops, Rubs or new Murmurs,  +ve B.Sounds,  Abd Soft, colostomy bag in place. No Cyanosis, Clubbing or edema    Assessment/Plan:  Septic shock secondary to sigmoid diverticulitis with perforation resulting in peritonitis-now s/p Hartman's procedure 5/29. Sepsis physiology has resolved Required pressors while in the ICU Remains on cefepime  General Surgery following and directing postop care.  Now has liquid stool in her ostomy bag, repeat CT scan from 10/16/2023 is stable, per general surgery she is improving continue to monitor likely discharge in the next few days.  Acute on chronic hypoxic/hypercapnic respiratory failure (on 4 L of oxygen at home) Worsening hypoxia-secondary to sepsis/pain-causing splitting-background of COPD Improving on 4 L nasal cannula oxygen, encouraged to sit in chair use I-S and flutter valve.  On home oxygen as well.  Normocytic anemia Secondary to critical illness Follow CBC and transfuse if significant drop  COPD Stable Continue bronchodilators  HTN BP stable without the use of any antihypertensives Resume amlodipine  when able  Hypomagnesemia.  Replaced.    Hyponatremia.  Likely dehydration improving with IV fluids.  Anxiety/depression Resume Celexa  when oral intake is more stable.  GERD PPI  EtOH abuse No signs of alcohol withdrawal Continue to monitor closely  Tobacco abuse Counseled  Liver lesion - isodense lesion noted on CT, nonemergent MRI recommended outpt per PCP   Class 1 Obesity: Estimated body mass index is 35.42 kg/m as calculated from the following:   Height as of this encounter: 5\' 3"  (1.6 m).   Weight as of this encounter: 90.7 kg.   Code status:   Code Status: Full Code   DVT Prophylaxis: Place TED hose Start: 10/15/23 0908 enoxaparin  (LOVENOX ) injection 40 mg Start: 10/11/23 1000 SCDs Start: 10/09/23 1722    Family Communication: None at bedside   Disposition  Plan: Status is: Inpatient Remains inpatient appropriate because: Severity of illness    Planned Discharge Destination:Home health   Diet: Diet Order             Diet regular Room service appropriate? Yes; Fluid consistency: Thin  Diet effective now                   MEDICATIONS: Scheduled Meds:  acetaminophen   1,000 mg Oral Q6H   budesonide -glycopyrrolate -formoterol   2 puff Inhalation BID   enoxaparin  (LOVENOX ) injection  40 mg Subcutaneous Q24H   guaiFENesin   600 mg Oral BID   methocarbamol   1,000 mg Oral TID   midodrine   10 mg Oral TID WC   pantoprazole   40 mg Oral BID   Ensure Max Protein  11 oz Oral BID   sodium chloride  flush  3 mL Intravenous Q12H   SMOG  200 mL Rectal Once   thiamine   100 mg Oral Daily   Continuous Infusions:   PRN Meds:.albuterol , ALPRAZolam , azelastine , HYDROmorphone  (DILAUDID ) injection, melatonin, [DISCONTINUED] ondansetron  **OR** ondansetron  (ZOFRAN ) IV, oxyCODONE , prochlorperazine , SUMAtriptan    I have personally reviewed following labs and imaging studies   Data Review:   DVT Prophylaxis  Place TED hose Start: 10/15/23 0908 enoxaparin  (LOVENOX ) injection 40 mg Start: 10/11/23 1000 SCDs Start: 10/09/23 1722  Recent Labs  Lab 10/13/23 0833 10/14/23 0337 10/15/23 0318 10/16/23 0318 10/17/23 0920  WBC 7.9 10.1 11.6* 16.4* 13.2*  HGB 9.9* 9.9* 9.0* 9.4* 10.2*  HCT 29.9* 29.3* 26.6* 28.6* 31.0*  PLT 216 231 285 331 350  MCV 94.9 94.8 94.7 95.0 94.8  MCH 31.4 32.0 32.0 31.2 31.2  MCHC 33.1 33.8 33.8 32.9 32.9  RDW 13.3 13.2 13.4 13.6 13.9  LYMPHSABS  --  1.2 1.7 0.7 PENDING  MONOABS  --  1.6* 1.7* 3.1* PENDING  EOSABS  --  0.4 0.5 0.3 PENDING  BASOSABS  --  0.0 0.0 0.0 PENDING    Recent Labs  Lab 10/12/23 0824 10/13/23 0833 10/14/23 0337 10/15/23 0318 10/16/23 0318 10/16/23 0750  NA 134* 133* 132* 131* 133*  --   K 3.7 3.7 3.7 3.9 3.9  --   CL 99 98 97* 99 102  --   CO2 26 24 25 29 25   --   ANIONGAP 9 11 10  3* 6  --   GLUCOSE 74 84 96 106* 92  --   BUN 15 10 6  <5* 6  --   CREATININE 0.64 0.56 0.55  0.55 0.63  --   CRP  --   --   --   --   --  15.3*  PROCALCITON  --   --   --   --  7.47  --   MG  --   --  1.2* 1.8 1.5*  --   PHOS  --   --  2.8 2.5 2.6  --   CALCIUM  7.7* 8.0* 7.7* 7.4* 7.6*  --      Recent Labs  Lab 10/12/23 0824 10/13/23 0833 10/14/23 0337 10/15/23 0318 10/16/23 0318 10/16/23 0750  CRP  --   --   --   --   --  15.3*  PROCALCITON  --   --   --   --  7.47  --   MG  --   --  1.2* 1.8 1.5*  --   CALCIUM  7.7* 8.0* 7.7* 7.4* 7.6*  --     Radiology Reports  CT ABDOMEN PELVIS W CONTRAST Result Date: 10/16/2023 CLINICAL  DATA:  Abdominal pain postop EXAM: CT ABDOMEN AND PELVIS WITH CONTRAST TECHNIQUE: Multidetector CT imaging of the abdomen and pelvis was performed using the standard protocol following bolus administration of intravenous contrast. RADIATION DOSE REDUCTION: This exam was performed according to the departmental dose-optimization program which includes automated exposure control, adjustment of the mA and/or kV according to patient size and/or use of iterative reconstruction technique. CONTRAST:  75mL OMNIPAQUE  IOHEXOL  350 MG/ML SOLN COMPARISON:  Oct 08, 2023 FINDINGS: Lower chest: Small bilateral pleural effusion with bilateral basilar infiltrates and atelectasis. Hepatobiliary: Small amount of ascites. No focal hepatic lesions. Prior cholecystectomy, no biliary dilatation. Pancreas: Unremarkable. No pancreatic ductal dilatation or surrounding inflammatory changes. Spleen: Normal in size without focal abnormality. Adrenals/Urinary Tract: Adrenal glands are unremarkable. Kidneys are normal, without renal calculi, focal lesion, or hydronephrosis. Bladder is unremarkable. Stomach/Bowel: No small or large bowel obstruction. There is small amount of ascites with some bowel edema involving several bowel loops in the left midportion of the abdomen better seen from images 27-48. Edema may be secondary to the ascites. 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 No obstruction.  No organized intra-abdominal abscesses or drainable fluid collections. Vascular/Lymphatic: No significant vascular findings are present. No enlarged abdominal or pelvic lymph nodes. Reproductive: Status post hysterectomy. No adnexal masses. Other: No abdominal wall hernia or abnormality. No abdominopelvic ascites. Nonspecific edema and stranding of the anterior abdominal wall without evidence of organized fluid collections. Musculoskeletal: No acute or significant osseous findings. IMPRESSION: *Small bilateral pleural effusion with bilateral basilar infiltrates and atelectasis. *Small amount of ascites. 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 No obstruction. No organized intra-abdominal abscesses or drainable fluid collections. *Nonspecific edema and stranding of the anterior abdominal wall without evidence of organized fluid collections. *There is small amount of ascites with some bowel edema involving several bowel loops in the left midportion of the abdomen better seen from. Edema may be secondary to the ascites. Electronically Signed   By: Fredrich Jefferson M.D.   On: 10/16/2023 14:21   DG Chest Port 1 View Result Date: 10/16/2023 CLINICAL DATA:  Shortness of breath EXAM: PORTABLE CHEST 1 VIEW COMPARISON:  Oct 08, 2023 FINDINGS: The heart size and mediastinal contours are within normal limits. Both lungs are clear. The visualized skeletal structures are unremarkable. IMPRESSION: No active disease. Electronically Signed   By: Fredrich Jefferson M.D.   On: 10/16/2023 08:15      Signature  -   Lynnwood Sauer M.D on 10/17/2023 at 10:15 AM   -  To page go to [email or web address]

## 2023-10-18 DIAGNOSIS — K5720 Diverticulitis of large intestine with perforation and abscess without bleeding: Secondary | ICD-10-CM | POA: Diagnosis not present

## 2023-10-18 LAB — CBC WITH DIFFERENTIAL/PLATELET
Abs Immature Granulocytes: 0.81 10*3/uL — ABNORMAL HIGH (ref 0.00–0.07)
Basophils Absolute: 0.1 10*3/uL (ref 0.0–0.1)
Basophils Relative: 1 %
Eosinophils Absolute: 0.4 10*3/uL (ref 0.0–0.5)
Eosinophils Relative: 3 %
HCT: 29 % — ABNORMAL LOW (ref 36.0–46.0)
Hemoglobin: 9.7 g/dL — ABNORMAL LOW (ref 12.0–15.0)
Immature Granulocytes: 5 %
Lymphocytes Relative: 11 %
Lymphs Abs: 1.6 10*3/uL (ref 0.7–4.0)
MCH: 31.9 pg (ref 26.0–34.0)
MCHC: 33.4 g/dL (ref 30.0–36.0)
MCV: 95.4 fL (ref 80.0–100.0)
Monocytes Absolute: 1.5 10*3/uL — ABNORMAL HIGH (ref 0.1–1.0)
Monocytes Relative: 10 %
Neutro Abs: 10.5 10*3/uL — ABNORMAL HIGH (ref 1.7–7.7)
Neutrophils Relative %: 70 %
Platelets: 360 10*3/uL (ref 150–400)
RBC: 3.04 MIL/uL — ABNORMAL LOW (ref 3.87–5.11)
RDW: 14 % (ref 11.5–15.5)
WBC: 14.9 10*3/uL — ABNORMAL HIGH (ref 4.0–10.5)
nRBC: 0 % (ref 0.0–0.2)

## 2023-10-18 LAB — COMPREHENSIVE METABOLIC PANEL WITH GFR
ALT: 9 U/L (ref 0–44)
AST: 30 U/L (ref 15–41)
Albumin: 1.9 g/dL — ABNORMAL LOW (ref 3.5–5.0)
Alkaline Phosphatase: 129 U/L — ABNORMAL HIGH (ref 38–126)
Anion gap: 12 (ref 5–15)
BUN: 6 mg/dL (ref 6–20)
CO2: 18 mmol/L — ABNORMAL LOW (ref 22–32)
Calcium: 8 mg/dL — ABNORMAL LOW (ref 8.9–10.3)
Chloride: 103 mmol/L (ref 98–111)
Creatinine, Ser: 0.55 mg/dL (ref 0.44–1.00)
GFR, Estimated: >60 mL/min (ref >60)
Glucose, Bld: 85 mg/dL (ref 70–99)
Potassium: 3.9 mmol/L (ref 3.5–5.1)
Sodium: 133 mmol/L — ABNORMAL LOW (ref 135–145)
Total Bilirubin: 0.5 mg/dL (ref 0.0–1.2)
Total Protein: 5.7 g/dL — ABNORMAL LOW (ref 6.5–8.1)

## 2023-10-18 LAB — PROCALCITONIN: Procalcitonin: 2.19 ng/mL

## 2023-10-18 LAB — C-REACTIVE PROTEIN: CRP: 11.8 mg/dL — ABNORMAL HIGH (ref <1.0)

## 2023-10-18 LAB — MAGNESIUM: Magnesium: 2.1 mg/dL (ref 1.7–2.4)

## 2023-10-18 LAB — GLUCOSE, CAPILLARY
Glucose-Capillary: 100 mg/dL — ABNORMAL HIGH (ref 70–99)
Glucose-Capillary: 105 mg/dL — ABNORMAL HIGH (ref 70–99)
Glucose-Capillary: 105 mg/dL — ABNORMAL HIGH (ref 70–99)
Glucose-Capillary: 114 mg/dL — ABNORMAL HIGH (ref 70–99)
Glucose-Capillary: 94 mg/dL (ref 70–99)
Glucose-Capillary: 94 mg/dL (ref 70–99)
Glucose-Capillary: 97 mg/dL (ref 70–99)

## 2023-10-18 LAB — PHOSPHORUS: Phosphorus: 2.4 mg/dL — ABNORMAL LOW (ref 2.5–4.6)

## 2023-10-18 MED ORDER — POTASSIUM PHOSPHATES 15 MMOLE/5ML IV SOLN
30.0000 mmol | Freq: Once | INTRAVENOUS | Status: AC
  Administered 2023-10-18: 30 mmol via INTRAVENOUS
  Filled 2023-10-18: qty 10

## 2023-10-18 MED ORDER — CLOBETASOL PROPIONATE 0.05 % EX CREA
TOPICAL_CREAM | Freq: Two times a day (BID) | CUTANEOUS | Status: DC
  Administered 2023-10-18: 1 via TOPICAL
  Filled 2023-10-18: qty 15

## 2023-10-18 MED ORDER — METHOCARBAMOL 500 MG PO TABS
500.0000 mg | ORAL_TABLET | Freq: Three times a day (TID) | ORAL | Status: DC
  Administered 2023-10-18 – 2023-10-19 (×3): 500 mg via ORAL
  Filled 2023-10-18 (×3): qty 1

## 2023-10-18 MED ORDER — LACTATED RINGERS IV SOLN: INTRAVENOUS | Status: AC

## 2023-10-18 NOTE — Plan of Care (Signed)
  Problem: Education: Goal: Knowledge of General Education information will improve Description: Including pain rating scale, medication(s)/side effects and non-pharmacologic comfort measures Outcome: Not Progressing   Problem: Health Behavior/Discharge Planning: Goal: Ability to manage health-related needs will improve Outcome: Not Progressing   Problem: Clinical Measurements: Goal: Ability to maintain clinical measurements within normal limits will improve Outcome: Not Progressing Goal: Will remain free from infection Outcome: Not Progressing Goal: Diagnostic test results will improve Outcome: Not Progressing Goal: Respiratory complications will improve Outcome: Not Progressing Goal: Cardiovascular complication will be avoided Outcome: Not Progressing   Problem: Activity: Goal: Risk for activity intolerance will decrease Outcome: Not Progressing   Problem: Nutrition: Goal: Adequate nutrition will be maintained Outcome: Not Progressing   Problem: Coping: Goal: Level of anxiety will decrease Outcome: Not Progressing   Problem: Elimination: Goal: Will not experience complications related to bowel motility Outcome: Not Progressing Goal: Will not experience complications related to urinary retention Outcome: Not Progressing   Problem: Pain Managment: Goal: General experience of comfort will improve and/or be controlled Outcome: Not Progressing   Problem: Safety: Goal: Ability to remain free from injury will improve Outcome: Not Progressing   Problem: Skin Integrity: Goal: Risk for impaired skin integrity will decrease Outcome: Not Progressing   Problem: Activity: Goal: Ability to tolerate increased activity will improve Outcome: Not Progressing   Problem: Respiratory: Goal: Ability to maintain a clear airway and adequate ventilation will improve Outcome: Not Progressing   Problem: Role Relationship: Goal: Method of communication will improve Outcome: Not  Progressing

## 2023-10-18 NOTE — TOC Progression Note (Signed)
 Transition of Care [facility]) - Progression Note    Patient Details  Name: Vicki [name] MRN: [id number] Date of Birth: May 19, 1965  [facility] [place]) CM/SW Contact  Eusebio High, RN Phone Number: 10/18/2023, 3:46 PM  Clinical Narrative:     TOC following for needs- Wound Vac paperwork submitted and approved by [employer] to be delivered bedside.  [facility] will provide services. Family will provide transportation at home at [place]     Bedisde commode also ordered     [employer]  (From admission, onward)           Start     Ordered   10/18/23 1558  For home use only DME Bedside commode  Once       Question:  Patient needs a bedside commode to treat with the following condition  Answer:  Diverticulitis   10/18/23 1559   10/14/23 0733  For home use only DME Negative pressure wound device  Once       Question Answer Comment  Frequency of dressing change 2 times per week   Length of need 3 Months   Dressing type Foam   Amount of suction 125 mm/Hg   Pressure application Continuous pressure   Supplies 10 canisters and 15 dressings per month for duration of therapy      10/14/23 0732              Expected Discharge Plan: Home/Self Care Barriers to Discharge: Continued Medical Work up  Expected Discharge Plan and Services In-house Referral: Clinical Social Work     Living arrangements for the past 2 months: Single Family Home                                       Social Determinants of Health (SDOH) Interventions SDOH Screenings   Food Insecurity: No Food Insecurity (10/12/2023)  Housing: Unknown (10/12/2023)  Transportation Needs: No Transportation Needs (10/12/2023)  Utilities: Not At Risk (10/12/2023)  Alcohol Screen: Low Risk  (11/01/2022)  Depression (PHQ2-9): High Risk (04/25/2023)  Financial Resource Strain: Low Risk  (11/01/2022)  Physical Activity: Inactive (11/01/2022)  Social Connections: Moderately Isolated  (11/01/2022)  Stress: No Stress Concern Present (11/01/2022)  Tobacco Use: High Risk (10/10/2023)    Readmission Risk Interventions     No data to display

## 2023-10-18 NOTE — Progress Notes (Addendum)
 PROGRESS NOTE        PATIENT DETAILS Name: Vicki [name] Age: 58 y.o. Sex: female Date of Birth: 1966-02-06 Admit Date: 10/08/2023 Admitting Physician Lena Qualia, MD [id number], Schuyler Custard, FNP  Brief Summary: Patient is a 58 y.o.  female with history of COPD on 4 L of oxygen at home-presented with abdominal pain secondary to sigmoid diverticulosis, hospital course complicated by development of septic shock requiring pressors.  She subsequently developed peritonitis and was taken to the operating room where she was found to have perforated diverticulitis with purulent peritonitis and underwent Hartman's procedure.    Significant events: 5/27>> admit to [facility]-severe sepsis secondary to sigmoid diverticulitis-BP soft-PCCM consulted-keep on floor. 5/28>> persistent hypotension-septic shock-transferred to ICU 5/29>> peritonitis-persistent septic shock-2 OR-perforated diverticulitis with purulent peritonitis-underwent Hartman's procedure. 5/31>> transferred back to [facility]  Significant studies: 5/27>> CT abdomen/pelvis: Severe sigmoid diverticulitis without associated abscess/perforation.  Significant microbiology data: 5/27>> COVID/influenza/RSV PCR: Negative 5/27>> stool C. difficile studies: Negative 5/27>> blood cultures: Negative  Procedures: 5/29>> Hartman's procedure  Consults: PCCM CCS  Subjective: Patient in bed, appears comfortable, denies any headache, no fever, no chest pain or pressure, no shortness of breath , no abdominal pain. No new focal weakness.   Objective: Vitals: Blood pressure 103/77, pulse 75, temperature 97.7 F (36.5 C), temperature source Oral, resp. rate 18, height 5\' 3"  (1.6 m), weight 90.7 kg, SpO2 99%.   Exam:  Awake Alert, No new F.N deficits, Normal affect [place].AT,PERRAL Supple Neck, No JVD,   Symmetrical Chest wall movement, Good air movement bilaterally, CTAB RRR,No Gallops, Rubs or new Murmurs,  +ve B.Sounds,  Abd Soft, colostomy bag in place - stool. No Cyanosis, Clubbing or edema    Assessment/Plan:  Septic shock secondary to sigmoid diverticulitis with perforation resulting in peritonitis-now s/p Hartman's procedure 5/29. Sepsis physiology has resolved Required pressors while in the ICU Remains on cefepime  General Surgery following and directing postop care.  Now has liquid stool in her ostomy bag, repeat CT scan from 10/16/2023 is stable, per general surgery she is improving continue to monitor likely discharge in the next few days.  Acute on chronic hypoxic/hypercapnic respiratory failure (on 4 L of oxygen at home) Worsening hypoxia-secondary to sepsis/pain-causing splitting-background of COPD Improving on 4 L nasal cannula oxygen, encouraged to sit in chair use I-S and flutter valve.  On home oxygen as well.  Normocytic anemia Secondary to critical illness Follow CBC and transfuse if significant drop  COPD Stable Continue bronchodilators  HTN BP stable without the use of any antihypertensives Resume amlodipine  when able  Hypomagnesemia, hypophosphatemia.  Replaced.    Hyponatremia.  Likely dehydration improving with IV fluids.  Anxiety/depression Resume Celexa  when oral intake is more stable.  GERD PPI  EtOH abuse No signs of alcohol withdrawal Continue to monitor closely  Tobacco abuse Counseled  Liver lesion - isodense lesion noted on CT, nonemergent MRI recommended outpt per PCP   Class 1 Obesity: Estimated body mass index is 35.42 kg/m as calculated from the following:   Height as of this encounter: 5\' 3"  (1.6 m).   Weight as of this encounter: 90.7 kg.   Code status:   Code Status: Full Code   DVT Prophylaxis: Place TED hose Start: 10/15/23 0908 enoxaparin  (LOVENOX ) injection 40 mg Start: 10/11/23 1000 SCDs Start: 10/09/23 1722    Family Communication: None at bedside  Disposition Plan: Status is: Inpatient Remains inpatient appropriate because:  Severity of illness   Planned Discharge Destination:Home health   Diet: Diet Order             Diet regular Room service appropriate? Yes; Fluid consistency: Thin  Diet effective now                   MEDICATIONS: Scheduled Meds:  acetaminophen   1,000 mg Oral Q6H   budesonide -glycopyrrolate -formoterol   2 puff Inhalation BID   enoxaparin  (LOVENOX ) injection  40 mg Subcutaneous Q24H   guaiFENesin   600 mg Oral BID   methocarbamol   1,000 mg Oral TID   midodrine   10 mg Oral TID WC   pantoprazole   40 mg Oral BID   Ensure Max Protein  11 oz Oral BID   sodium chloride  flush  3 mL Intravenous Q12H   SMOG  200 mL Rectal Once   thiamine   100 mg Oral Daily   Continuous Infusions:   PRN Meds:.albuterol , ALPRAZolam , azelastine , HYDROmorphone  (DILAUDID ) injection, melatonin, [DISCONTINUED] ondansetron  **OR** ondansetron  (ZOFRAN ) IV, oxyCODONE , prochlorperazine , SUMAtriptan    I have personally reviewed following labs and imaging studies   Data Review:   DVT Prophylaxis  Place TED hose Start: 10/15/23 0908 enoxaparin  (LOVENOX ) injection 40 mg Start: 10/11/23 1000 SCDs Start: 10/09/23 1722  Recent Labs  Lab 10/13/23 0833 10/14/23 0337 10/15/23 0318 10/16/23 0318 10/17/23 0920  WBC 7.9 10.1 11.6* 16.4* 13.2*  HGB 9.9* 9.9* 9.0* 9.4* 10.2*  HCT 29.9* 29.3* 26.6* 28.6* 31.0*  PLT 216 231 285 331 350  MCV 94.9 94.8 94.7 95.0 94.8  MCH 31.4 32.0 32.0 31.2 31.2  MCHC 33.1 33.8 33.8 32.9 32.9  RDW 13.3 13.2 13.4 13.6 13.9  LYMPHSABS  --  1.2 1.7 0.7 1.5  MONOABS  --  1.6* 1.7* 3.1* 0.8  EOSABS  --  0.4 0.5 0.3 0.5  BASOSABS  --  0.0 0.0 0.0 0.1    Recent Labs  Lab 10/13/23 0833 10/14/23 0337 10/15/23 0318 10/16/23 0318 10/16/23 0750 10/17/23 0920  NA 133* 132* 131* 133*  --  132*  K 3.7 3.7 3.9 3.9  --  3.6  CL 98 97* 99 102  --  101  CO2 24 25 29 25   --  24  ANIONGAP 11 10 3* 6  --  7  GLUCOSE 84 96 106* 92  --  127*  BUN 10 6 <5* 6  --  6  CREATININE 0.56  0.55 0.55 0.63  --  0.52  AST  --   --   --   --   --  25  ALT  --   --   --   --   --  9  ALKPHOS  --   --   --   --   --  120  BILITOT  --   --   --   --   --  0.7  ALBUMIN   --   --   --   --   --  1.7*  CRP  --   --   --   --  15.3* 12.5*  PROCALCITON  --   --   --  7.47  --  4.13  MG  --  1.2* 1.8 1.5*  --  1.6*  PHOS  --  2.8 2.5 2.6  --  2.2*  CALCIUM  8.0* 7.7* 7.4* 7.6*  --  7.8*     Recent Labs  Lab 10/13/23 [phone] 10/14/23 [id number]  10/15/23 0318 10/16/23 0318 10/16/23 0750 10/17/23 0920  CRP  --   --   --   --  15.3* 12.5*  PROCALCITON  --   --   --  7.47  --  4.13  MG  --  1.2* 1.8 1.5*  --  1.6*  CALCIUM  8.0* 7.7* 7.4* 7.6*  --  7.8*    Radiology Reports  CT ABDOMEN PELVIS W CONTRAST Result Date: 10/16/2023 CLINICAL DATA:  Abdominal pain postop EXAM: CT ABDOMEN AND PELVIS WITH CONTRAST TECHNIQUE: Multidetector CT imaging of the abdomen and pelvis was performed using the standard protocol following bolus administration of intravenous contrast. RADIATION DOSE REDUCTION: This exam was performed according to the departmental dose-optimization program which includes automated exposure control, adjustment of the mA and/or kV according to patient size and/or use of iterative reconstruction technique. CONTRAST:  75mL OMNIPAQUE  IOHEXOL  350 MG/ML SOLN COMPARISON:  Oct 08, 2023 FINDINGS: Lower chest: Small bilateral pleural effusion with bilateral basilar infiltrates and atelectasis. Hepatobiliary: Small amount of ascites. No focal hepatic lesions. Prior cholecystectomy, no biliary dilatation. Pancreas: Unremarkable. No pancreatic ductal dilatation or surrounding inflammatory changes. Spleen: Normal in size without focal abnormality. Adrenals/Urinary Tract: Adrenal glands are unremarkable. Kidneys are normal, without renal calculi, focal lesion, or hydronephrosis. Bladder is unremarkable. Stomach/Bowel: No small or large bowel obstruction. There is small amount of ascites with some bowel edema  involving several bowel loops in the left midportion of the abdomen better seen from images 27-48. Edema may be secondary to the ascites. 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 No obstruction. No organized intra-abdominal abscesses or drainable fluid collections. Vascular/Lymphatic: No significant vascular findings are present. No enlarged abdominal or pelvic lymph nodes. Reproductive: Status post hysterectomy. No adnexal masses. Other: No abdominal wall hernia or abnormality. No abdominopelvic ascites. Nonspecific edema and stranding of the anterior abdominal wall without evidence of organized fluid collections. Musculoskeletal: No acute or significant osseous findings. IMPRESSION: *Small bilateral pleural effusion with bilateral basilar infiltrates and atelectasis. *Small amount of ascites. 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 No obstruction. No organized intra-abdominal abscesses or drainable fluid collections. *Nonspecific edema and stranding of the anterior abdominal wall without evidence of organized fluid collections. *There is small amount of ascites with some bowel edema involving several bowel loops in the left midportion of the abdomen better seen from. Edema may be secondary to the ascites. Electronically Signed   By: Fredrich Jefferson M.D.   On: 10/16/2023 14:21   DG Chest Port 1 View Result Date: 10/16/2023 CLINICAL DATA:  Shortness of breath EXAM: PORTABLE CHEST 1 VIEW COMPARISON:  Oct 08, 2023 FINDINGS: The heart size and mediastinal contours are within normal limits. Both lungs are clear. The visualized skeletal structures are unremarkable. IMPRESSION: No active disease. Electronically Signed   By: Fredrich Jefferson M.D.   On: 10/16/2023 08:15      Signature  -   Lynnwood Sauer M.D on 10/18/2023 at 7:27 AM   -  To page go to [email or web address]

## 2023-10-18 NOTE — Progress Notes (Signed)
 [facility] Liaison Note  10/18/2023  Harlie Buening April 16, 1966 [id number]  Location: RN Hospital Liaison screened the patient remotely at [facility].  Insurance: [facility]   Hoda Hon is a 58 y.o. female who is a Primary Care Patient of Gwen Lek, Schuyler Custard, FNP  [facility]. The patient was screened for readmission hospitalization with noted high risk score for unplanned readmission risk with 1 IP in 6 months.  Patient is currently active with Care Management for chronic disease management services.  Patient has been engaged by a Tourist information centre manager.  Our community based plan of care has focused on disease management and community resource support.   Patient will receive a post hospital call and will be evaluated for assessments and disease process education. Liaison will collaborate with the involved VBCI team member concerning pt's discharge disposition.   Review of patient's electronic medical record reveals patient was admitted for Diverticulitis with perforation and abscess s/p colostomy. Pt will discharge home with [facility].   VBCI Care Management/Population Health does not replace or interfere with any arrangements made by the Inpatient Transition of Care team.   For questions contact:   Lilla Reichert, RN, BSN Hospital Liaison [place]   [facility], Population Health Office Hours MTWF  8:00 am-6:00 pm Direct Dial: [phone] mobile [email or web address]

## 2023-10-18 NOTE — Progress Notes (Signed)
 Progress Note  8 Days Post-Op  Subjective: Pt tolerating diet and having bowel function. Pain control is an issue with low BP. VAC changed yesterday and wound looks healthy.   Objective: Vital signs in last 24 hours: Temp:  [97.7 F (36.5 C)-98.2 F (36.8 C)] 97.7 F (36.5 C) (06/06 0428) Pulse Rate:  [70-80] 80 (06/06 0905) Resp:  [13-18] 13 (06/06 0905) BP: (95-113)/(64-80) 95/64 (06/06 0905) SpO2:  [93 %-100 %] 100 % (06/06 0905) FiO2 (%):  [50 %] 50 % (06/05 2059) Last BM Date : 10/18/23  Intake/Output from previous day: 06/05 0701 - 06/06 0700 In: 240 [P.O.:240] Out: 1350 [Urine:1150; Stool:200] Intake/Output this shift: Total I/O In: -  Out: 950 [Urine:850; Stool:100]  PE: General: WD, obese female, NAD  HEENT: Sclera anicteric Heart: RRR Lungs: on 4L via  which is baseline, normal effot  Abd: mildly distended, appropriately ttp, midline wound with VAC present, ostomy viable with gas and liquid stool in bag Psych: A&Ox3 with an appropriate affect.    Lab Results:  Recent Labs    10/17/23 0920 10/18/23 0814  WBC 13.2* 14.9*  HGB 10.2* 9.7*  HCT 31.0* 29.0*  PLT 350 360   BMET Recent Labs    10/17/23 0920 10/18/23 0608  NA 132* 133*  K 3.6 3.9  CL 101 103  CO2 24 18*  GLUCOSE 127* 85  BUN 6 6  CREATININE 0.52 0.55  CALCIUM  7.8* 8.0*   PT/INR No results for input(s): "LABPROT", "INR" in the last 72 hours.  CMP     Component Value Date/Time   NA 133 (L) 10/18/2023 0608   NA 140 10/07/2020 0925   K 3.9 10/18/2023 0608   CL 103 10/18/2023 0608   CO2 18 (L) 10/18/2023 0608   GLUCOSE 85 10/18/2023 0608   BUN 6 10/18/2023 0608   BUN 12 10/07/2020 0925   CREATININE 0.55 10/18/2023 0608   CREATININE 0.76 04/25/2023 1136   CALCIUM  8.0 (L) 10/18/2023 0608   PROT 5.7 (L) 10/18/2023 0608   PROT 7.0 10/07/2020 0925   ALBUMIN  1.9 (L) 10/18/2023 0608   ALBUMIN  4.3 10/07/2020 0925   AST 30 10/18/2023 0608   ALT 9 10/18/2023 0608   ALKPHOS  129 (H) 10/18/2023 0608   BILITOT 0.5 10/18/2023 0608   BILITOT 0.2 10/07/2020 0925   GFRNONAA >60 10/18/2023 0608   GFRAA 81 08/17/2019 1238   Lipase     Component Value Date/Time   LIPASE 30 12/22/2022 1903       Studies/Results: CT ABDOMEN PELVIS W CONTRAST Result Date: 10/16/2023 CLINICAL DATA:  Abdominal pain postop EXAM: CT ABDOMEN AND PELVIS WITH CONTRAST TECHNIQUE: Multidetector CT imaging of the abdomen and pelvis was performed using the standard protocol following bolus administration of intravenous contrast. RADIATION DOSE REDUCTION: This exam was performed according to the departmental dose-optimization program which includes automated exposure control, adjustment of the mA and/or kV according to patient size and/or use of iterative reconstruction technique. CONTRAST:  75mL OMNIPAQUE  IOHEXOL  350 MG/ML SOLN COMPARISON:  Oct 08, 2023 FINDINGS: Lower chest: Small bilateral pleural effusion with bilateral basilar infiltrates and atelectasis. Hepatobiliary: Small amount of ascites. No focal hepatic lesions. Prior cholecystectomy, no biliary dilatation. Pancreas: Unremarkable. No pancreatic ductal dilatation or surrounding inflammatory changes. Spleen: Normal in size without focal abnormality. Adrenals/Urinary Tract: Adrenal glands are unremarkable. Kidneys are normal, without renal calculi, focal lesion, or hydronephrosis. Bladder is unremarkable. Stomach/Bowel: No small or large bowel obstruction. There is small amount of ascites with  some bowel edema involving several bowel loops in the left midportion of the abdomen better seen from images 27-48. Edema may be secondary to the ascites. 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 No obstruction. No organized intra-abdominal abscesses or drainable fluid collections. Vascular/Lymphatic: No significant vascular findings are present. No enlarged abdominal or pelvic lymph nodes. Reproductive: Status post hysterectomy. No adnexal masses. Other: No  abdominal wall hernia or abnormality. No abdominopelvic ascites. Nonspecific edema and stranding of the anterior abdominal wall without evidence of organized fluid collections. Musculoskeletal: No acute or significant osseous findings. IMPRESSION: *Small bilateral pleural effusion with bilateral basilar infiltrates and atelectasis. *Small amount of ascites. 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 No obstruction. No organized intra-abdominal abscesses or drainable fluid collections. *Nonspecific edema and stranding of the anterior abdominal wall without evidence of organized fluid collections. *There is small amount of ascites with some bowel edema involving several bowel loops in the left midportion of the abdomen better seen from. Edema may be secondary to the ascites. Electronically Signed   By: Fredrich Jefferson M.D.   On: 10/16/2023 14:21    Anti-infectives: Anti-infectives (From admission, onward)    Start     Dose/Rate Route Frequency Ordered Stop   10/12/23 1400  ceFEPIme  (MAXIPIME ) 2 g in sodium chloride  0.9 % 100 mL IVPB       Note to Pharmacy: Pharmacy may adjust dosing strength, schedule, rate of infusion, etc as needed to optimize therapy   2 g 200 mL/hr over 30 Minutes Intravenous Every 8 hours 10/12/23 1036 10/17/23 0705   10/09/23 0800  metroNIDAZOLE  (FLAGYL ) IVPB 500 mg        500 mg 100 mL/hr over 60 Minutes Intravenous Every 12 hours 10/08/23 2220 10/16/23 0724   10/09/23 0000  cefTRIAXone  (ROCEPHIN ) 2 g in sodium chloride  0.9 % 100 mL IVPB  Status:  Discontinued        2 g 200 mL/hr over 30 Minutes Intravenous Every 24 hours 10/08/23 2220 10/12/23 1036   10/08/23 1730  aztreonam  (AZACTAM ) 2 g in sodium chloride  0.9 % 100 mL IVPB        2 g 200 mL/hr over 30 Minutes Intravenous  Once 10/08/23 1724 10/08/23 1812   10/08/23 1730  metroNIDAZOLE  (FLAGYL ) IVPB 500 mg        500 mg 100 mL/hr over 60 Minutes Intravenous  Once 10/08/23 1724 10/08/23 1854   10/08/23 1730  vancomycin   (VANCOCIN ) IVPB 1000 mg/200 mL premix        1,000 mg 200 mL/hr over 60 Minutes Intravenous  Once 10/08/23 1724 10/08/23 1954        Assessment/Plan Septic shock secondary to Sigmoid Diverticulitis with purulent ascites and peritonitis  POD8 s/p exploratory laparotomy, sigmoid colectomy, end colostomy Dr. Camilo Cella - reg diet, ensure max BID - VAC M-R  - completed flagyl , remains on cefepime  today - CT 6/4 without abdominal source of leukocytosis  - WBC 14K, afebrile  - mobilize as tolerated, IS/flutter valve - WOC following for new colostomy  - surgically stable for DC when medically cleared, HH VAC and RN have been ordered, referral made to ostomy clinic. 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

## 2023-10-18 NOTE — Progress Notes (Signed)
 Physical Therapy Treatment Patient Details Name: Vicki [name] MRN: [id number] DOB: 1965-09-18 Today's Date: 10/18/2023   History of Present Illness 58 yo female admitted 10/08/23 with pelvic and lower back pain, diarrhea, sepsis due to diverticulitis. 5/29 Ex lap with sigmoid colectomy, end colostomy. 5/29 extubated.  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    PT Comments  Good progress. Ambulates slowly with RW at a CGA level up to 65 feet today with 2 standing rest breaks. SpO2 94% on 4L. CGA for transfers from recliner. Educated on safety, increased mobility as tolerated, frequent OOB attempts with staff. Patient will continue to benefit from skilled physical therapy services to further improve independence with functional mobility.    If plan is discharge home, recommend the following: A little help with walking and/or transfers;A little help with bathing/dressing/bathroom;Assistance with cooking/housework;Assist for transportation;Help with stairs or ramp for entrance   Can travel by private vehicle        Equipment Recommendations  BSC/3in1    Recommendations for Other Services       Precautions / Restrictions Precautions Precautions: Fall Recall of Precautions/Restrictions: Impaired Precaution/Restrictions Comments: watch sats, desats with all movement, open abdomen, ostomy Restrictions Weight Bearing Restrictions Per Provider Order: No     Mobility  Bed Mobility Overal bed mobility: Needs Assistance Bed Mobility: Sit to Supine       Sit to supine: Contact guard assist   General bed mobility comments: Able to sit EOB and turn, brought HOB to pt while sitting up to avoid straining abdomen.    Transfers Overall transfer level: Needs assistance Equipment used: Rolling walker (2 wheels) Transfers: Sit to/from Stand Sit to Stand: Contact guard assist           General transfer comment: CGA for safety rising from recliner. Good hand placement. Slow  to rise and extend trunk.    Ambulation/Gait Ambulation/Gait assistance: Contact guard assist Gait Distance (Feet): 65 Feet Assistive device: Rolling walker (2 wheels) Gait Pattern/deviations: Step-through pattern, Decreased stride length, Trunk flexed Gait velocity: dec Gait velocity interpretation: <1.31 ft/sec, indicative of household ambulator   General Gait Details: CGA for safety, cues for proximity to RW. Slight posterior lean but able to self correct on one instance. Educated on safety and awareness. Spo2 94% on 4L supplemental O2. Required 2 standing rest breaks to complete.   Stairs             Wheelchair Mobility     Tilt Bed    Modified Rankin (Stroke Patients Only)       Balance Overall balance assessment: Needs assistance Sitting-balance support: Feet supported Sitting balance-Leahy Scale: Good Sitting balance - Comments: seated in recliner with no back support   Standing balance support: Single extremity supported Standing balance-Leahy Scale: Poor Standing balance comment: reliant on RW for support                            Communication Communication Communication: No apparent difficulties  Cognition Arousal: Alert Behavior During Therapy: WFL for tasks assessed/performed   PT - Cognitive impairments: No apparent impairments                         Following commands: Intact      Cueing Cueing Techniques: Verbal cues  Exercises      General Comments General comments (skin integrity, edema, etc.): VSS on 4L  Pertinent Vitals/Pain Pain Assessment Pain Assessment: Faces Faces Pain Scale: Hurts even more Pain Location: abdomen with movement Pain Descriptors / Indicators: Guarding, Grimacing, Sore Pain Intervention(s): Limited activity within patient's tolerance, Premedicated before session, Repositioned    Home Living                          Prior Function            PT Goals (current  goals can now be found in the care plan section) Acute Rehab PT Goals Patient Stated Goal: return home PT Goal Formulation: With patient Time For Goal Achievement: 10/25/23 Potential to Achieve Goals: Good Progress towards PT goals: Progressing toward goals    Frequency    Min 2X/week      PT Plan      Co-evaluation              AM-PAC PT "6 Clicks" Mobility   Outcome Measure  Help needed turning from your back to your side while in a flat bed without using bedrails?: A Little Help needed moving from lying on your back to sitting on the side of a flat bed without using bedrails?: A Little Help needed moving to and from a bed to a chair (including a wheelchair)?: A Little Help needed standing up from a chair using your arms (e.g., wheelchair or bedside chair)?: A Little Help needed to walk in hospital room?: A Little Help needed climbing 3-5 steps with a railing? : A Lot 6 Click Score: 17    End of Session Equipment Utilized During Treatment: Oxygen Activity Tolerance: Patient tolerated treatment well Patient left: with SCD's reapplied;in bed;with call bell/phone within reach;with bed alarm set;with family/visitor present Nurse Communication: Mobility status PT Visit Diagnosis: Other abnormalities of gait and mobility (R26.89);Difficulty in walking, not elsewhere classified (R26.2);Unsteadiness on feet (R26.81);Muscle weakness (generalized) (M62.81);Pain Pain - part of body:  (abdomen)     Time: 2440-1027 PT Time Calculation (min) (ACUTE ONLY): 33 min  Charges:    $Gait Training: 8-22 mins $Therapeutic Activity: 8-22 mins PT General Charges $$ ACUTE PT VISIT: 1 Visit                     Jory Ng, PT, DPT [facility]  Rehabilitation Services Physical Therapist Office: [phone] Website: [email or web address]    Alinda Irani 10/18/2023, 3:56 PM

## 2023-10-18 NOTE — Progress Notes (Signed)
 Occupational Therapy Treatment Patient Details Name: Vicki [name] MRN: [id number] DOB: 1966-02-26 Today's Date: 10/18/2023   History of present illness 58 yo female admitted 10/08/23 with pelvic and lower back pain, diarrhea, sepsis due to diverticulitis. 5/29 Ex lap with sigmoid colectomy, end colostomy. 5/29 extubated.  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   OT comments  Patient received in supine and agreeable to OT treatment. Patient feeling fatigued due to lack of sleep last night. Patient requiring increased time and min assist to get to EOB. Patient participated in UB bathing while seated on EOB with assistance for back. Patient performed transfer to recliner with min assist to stand and for transfer with RW. Patient positioned in chair for comfort and patient's husband present at end of session. Acute OT to continue to follow to address established goals.       If plan is discharge home, recommend the following:  Assist for transportation;Assistance with cooking/housework;A little help with bathing/dressing/bathroom   Equipment Recommendations  Tub/shower seat    Recommendations for Other Services      Precautions / Restrictions Precautions Precautions: Fall Recall of Precautions/Restrictions: Impaired Precaution/Restrictions Comments: watch sats, desats with all movement, open abdomen, ostomy Restrictions Weight Bearing Restrictions Per Provider Order: No       Mobility Bed Mobility Overal bed mobility: Needs Assistance Bed Mobility: Rolling, Sidelying to Sit Rolling: Used rails, Contact guard assist Sidelying to sit: Min assist, HOB elevated, Used rails       General bed mobility comments: increased time due to abdominal pain    Transfers Overall transfer level: Needs assistance Equipment used: Rolling walker (2 wheels) Transfers: Sit to/from Stand, Bed to chair/wheelchair/BSC Sit to Stand: Min assist, From elevated surface     Step pivot  transfers: Min assist     General transfer comment: performed transfer from bed to recliner with min assist     Balance Overall balance assessment: Needs assistance Sitting-balance support: Feet supported Sitting balance-Leahy Scale: Good     Standing balance support: Bilateral upper extremity supported Standing balance-Leahy Scale: Poor Standing balance comment: reliant on RW for support                           ADL either performed or assessed with clinical judgement   ADL Overall ADL's : Needs assistance/impaired     Grooming: Set up;Sitting   Upper Body Bathing: Minimal assistance;Sitting Upper Body Bathing Details (indicate cue type and reason): for back     Upper Body Dressing : Set up;Sitting Upper Body Dressing Details (indicate cue type and reason): gown change Lower Body Dressing: Maximal assistance;Bed level Lower Body Dressing Details (indicate cue type and reason): to donn compression stockings Toilet Transfer: Minimal assistance;Rolling walker (2 wheels) Toilet Transfer Details (indicate cue type and reason): simulated to recliner                Extremity/Trunk Assessment              Vision       Perception     Praxis     Communication Communication Communication: No apparent difficulties   Cognition Arousal: Alert Behavior During Therapy: WFL for tasks assessed/performed Cognition: No apparent impairments                               Following commands: Intact  Cueing   Cueing Techniques: Verbal cues  Exercises      Shoulder Instructions       General Comments patient requiring increased time due to faigue from lack of sleep last night    Pertinent Vitals/ Pain       Pain Assessment Pain Assessment: Faces Faces Pain Scale: Hurts even more Pain Location: abdomen with movement Pain Descriptors / Indicators: Guarding, Grimacing, Sore Pain Intervention(s): Limited activity within patient's  tolerance, Monitored during session, Premedicated before session, Repositioned  Home Living                                          Prior Functioning/Environment              Frequency  Min 2X/week        Progress Toward Goals  OT Goals(current goals can now be found in the care plan section)  Progress towards OT goals: Progressing toward goals  Acute Rehab OT Goals Patient Stated Goal: to go home OT Goal Formulation: With patient Time For Goal Achievement: 10/25/23 Potential to Achieve Goals: Good ADL Goals Pt Will Perform Grooming: with modified independence;standing Pt Will Perform Lower Body Dressing: with modified independence;sit to/from stand;with adaptive equipment Pt Will Transfer to Toilet: with modified independence;regular height toilet;ambulating  Plan      Co-evaluation                 AM-PAC OT "6 Clicks" Daily Activity     Outcome Measure   Help from another person eating meals?: None Help from another person taking care of personal grooming?: None Help from another person toileting, which includes using toliet, bedpan, or urinal?: A Little Help from another person bathing (including washing, rinsing, drying)?: A Lot Help from another person to put on and taking off regular upper body clothing?: A Lot Help from another person to put on and taking off regular lower body clothing?: A Little 6 Click Score: 18    End of Session Equipment Utilized During Treatment: Rolling walker (2 wheels);Oxygen  OT Visit Diagnosis: Unsteadiness on feet (R26.81);Pain;Muscle weakness (generalized) (M62.81) Pain - part of body:  (abdominal pain)   Activity Tolerance Patient tolerated treatment well   Patient Left in chair;with call bell/phone within reach;with family/visitor present   Nurse Communication Mobility status        Time: 1610-9604 OT Time Calculation (min): 41 min  Charges: OT General Charges $OT Visit: 1 Visit OT  Treatments $Self Care/Home Management : 23-37 mins $Therapeutic Activity: 8-22 mins  Anitra Barn, OTA Acute Rehabilitation Services  Office [phone]   Jovita Nipper 10/18/2023, 2:29 PM

## 2023-10-19 ENCOUNTER — Other Ambulatory Visit (HOSPITAL_COMMUNITY): Payer: Self-pay

## 2023-10-19 DIAGNOSIS — K5720 Diverticulitis of large intestine with perforation and abscess without bleeding: Secondary | ICD-10-CM | POA: Diagnosis not present

## 2023-10-19 LAB — GLUCOSE, CAPILLARY
Glucose-Capillary: 87 mg/dL (ref 70–99)
Glucose-Capillary: 111 mg/dL — ABNORMAL HIGH (ref 70–99)

## 2023-10-19 MED ORDER — MELATONIN 5 MG PO TABS
5.0000 mg | ORAL_TABLET | Freq: Every evening | ORAL | 0 refills | Status: DC | PRN
  Filled 2023-10-19: qty 10, 10d supply, fill #0

## 2023-10-19 MED ORDER — FOLIC ACID 1 MG PO TABS
1.0000 mg | ORAL_TABLET | Freq: Every day | ORAL | 0 refills | Status: DC
  Filled 2023-10-19: qty 30, 30d supply, fill #0

## 2023-10-19 MED ORDER — THIAMINE HCL 100 MG PO TABS
100.0000 mg | ORAL_TABLET | Freq: Every day | ORAL | 0 refills | Status: DC
  Filled 2023-10-19: qty 30, 30d supply, fill #0

## 2023-10-19 MED ORDER — CEPHALEXIN 500 MG PO CAPS
500.0000 mg | ORAL_CAPSULE | Freq: Three times a day (TID) | ORAL | 0 refills | Status: AC
  Filled 2023-10-19: qty 15, 5d supply, fill #0

## 2023-10-19 MED ORDER — TRIAMCINOLONE ACETONIDE 0.5 % EX OINT
TOPICAL_OINTMENT | Freq: Two times a day (BID) | CUTANEOUS | 0 refills | Status: DC
  Filled 2023-10-19: qty 30, 7d supply, fill #0
  Filled 2023-10-19: qty 30, fill #0

## 2023-10-19 MED ORDER — ACETAMINOPHEN 500 MG PO TABS
500.0000 mg | ORAL_TABLET | Freq: Three times a day (TID) | ORAL | 0 refills | Status: AC | PRN
  Filled 2023-10-19: qty 30, 10d supply, fill #0

## 2023-10-19 MED ORDER — MIDODRINE HCL 5 MG PO TABS
5.0000 mg | ORAL_TABLET | Freq: Two times a day (BID) | ORAL | 0 refills | Status: DC
  Filled 2023-10-19: qty 30, 15d supply, fill #0

## 2023-10-19 MED ORDER — ONDANSETRON HCL 4 MG PO TABS
4.0000 mg | ORAL_TABLET | Freq: Three times a day (TID) | ORAL | 0 refills | Status: DC | PRN
  Filled 2023-10-19: qty 15, 5d supply, fill #0

## 2023-10-19 MED ORDER — ROSUVASTATIN CALCIUM 10 MG PO TABS
10.0000 mg | ORAL_TABLET | Freq: Every day | ORAL | 0 refills | Status: DC
  Filled 2023-10-19: qty 30, 30d supply, fill #0

## 2023-10-19 MED ORDER — HYDROCODONE-ACETAMINOPHEN 5-325 MG PO TABS
1.0000 | ORAL_TABLET | Freq: Two times a day (BID) | ORAL | 0 refills | Status: DC | PRN
  Filled 2023-10-19: qty 15, 8d supply, fill #0

## 2023-10-19 MED ORDER — METRONIDAZOLE 500 MG PO TABS
500.0000 mg | ORAL_TABLET | Freq: Three times a day (TID) | ORAL | 0 refills | Status: AC
  Filled 2023-10-19: qty 15, 1d supply, fill #0
  Filled 2023-10-19: qty 15, 5d supply, fill #0

## 2023-10-19 NOTE — Progress Notes (Signed)
 PT Cancellation Note  Patient Details Name: Vicki [name] MRN: [id number] DOB: Sep 20, 1965   Cancelled Treatment:    Reason Eval/Treat Not Completed: Patient declined, no reason specified  Nauseated, planning to go home shortly. Declines PT session this AM. Will follow.   Jory Ng, PT, DPT [facility]  Rehabilitation Services Physical Therapist Office: [phone] Website: [email or web address]  Alinda Irani 10/19/2023, 9:43 AM

## 2023-10-19 NOTE — Discharge Summary (Signed)
 Vicki [name] ERD:[id number] DOB: 1965/09/15 DOA: 10/08/2023  PCP: Jenelle Mis, FNP  Admit date: 10/08/2023  Discharge date: 10/19/2023  Admitted From: Home   Disposition:  Home   Recommendations for Outpatient Follow-up:   Follow up with PCP in 1-2 weeks  PCP Please obtain BMP/CBC, 2 view CXR in 1week,  (see Discharge instructions)   PCP Please follow up on the following pending results:  Liver lesion - isodense lesion noted on CT, nonemergent MRI recommended outpt per PCP   Home Health: RN   Equipment/Devices: as below  Consultations: CCS Discharge Condition: Stable     CODE STATUS: Full     Diet Recommendation: Heart Healthy      CC Abd pain   Brief history of present illness from the day of admission and additional interim summary    58 y.o.  female with history of COPD on 4 L of oxygen at home-presented with abdominal pain secondary to sigmoid diverticulosis, hospital course complicated by development of septic shock requiring pressors.  She subsequently developed peritonitis and was taken to the operating room where she was found to have perforated diverticulitis with purulent peritonitis and underwent Hartman's procedure.     Significant events: 5/27>> admit to [facility]-severe sepsis secondary to sigmoid diverticulitis-BP soft-PCCM consulted-keep on floor. 5/28>> persistent hypotension-septic shock-transferred to ICU 5/29>> peritonitis-persistent septic shock-2 OR-perforated diverticulitis with purulent peritonitis-underwent Hartman's procedure. 5/31>> transferred back to [facility]   Significant studies: 5/27>> CT abdomen/pelvis: Severe sigmoid diverticulitis without associated abscess/perforation.   Significant microbiology data: 5/27>> COVID/influenza/RSV PCR: Negative 5/27>> stool C. difficile  studies: Negative 5/27>> blood cultures: Negative   Procedures: 5/29>> Hartman's procedure                                                                 Hospital Course    Septic shock secondary to sigmoid diverticulitis with perforation resulting in peritonitis-now s/p Hartman's procedure 5/29. Sepsis physiology has resolved Required pressors while in the ICU Was on cefepime  here, much improved will get 5 days of oral Keflex and Flagyl  General Surgery followed the patient here and directed her postop care.  Now has liquid stool in her ostomy bag, repeat CT scan from 10/16/2023 is stable, case discussed with general surgery on 10/18/2023 stable for discharge with ostomy and wound VAC, arrangements for home wound VAC made by general surgery, ostomy instructions and teaching per general surgery.  Patient overall much improved, minimal postop site pain, eager to go home will be discharged with PCP follow-up and general surgery follow-up postdischarge.   Acute on chronic hypoxic/hypercapnic respiratory failure (on 4 L of oxygen at home) Worsening hypoxia-secondary to sepsis/pain-causing splitting-background of COPD Improved currently on 3 to 4 L of nasal cannula oxygen at rest which is her home equivalent.  Is been strictly counseled  to abstain from smoking and alcohol   Normocytic anemia Secondary to critical illness Follow CBC and anemia panel per PCP.   COPD Stable Continue bronchodilators   HTN Blood pressure was soft here her home blood pressure medication has been stopped, she was on midodrine  10 3 times daily now down to 5 twice daily, PCP to monitor and adjust.    Anxiety/depression Resume Celexa     GERD PPI   EtOH abuse No signs of alcohol withdrawal Continue to monitor closely   Tobacco abuse Counseled to quit.   Liver lesion - isodense lesion noted on CT, nonemergent MRI recommended outpt per PCP     Class 1 Obesity: Estimated body mass index is 35.42 follow-up  with PCP.    Discharge diagnosis     Principal Problem:   Diverticulitis with perforation and abscess s/p Hartmann/colostomy 10/10/2023 Active Problems:   Septic shock (HCC)   Sigmoid diverticulitis   Hypokalemia   COPD, severe (HCC)   Chronic respiratory failure with hypoxia (HCC)   Essential hypertension   Elevated AST (SGOT)   Alcohol abuse   Anxiety and depression   Insomnia   Tobacco abuse   Hyperlipidemia   GERD (gastroesophageal reflux disease)    Discharge instructions    Discharge Instructions     Ambulatory Referral for Lung Cancer Scre   Complete by: As directed    Ambulatory referral to Wound Clinic   Complete by: As directed    Diet - low sodium heart healthy   Complete by: As directed    Discharge instructions   Complete by: As directed    Ostomy care as instructed to you by general surgery.  Follow with Primary MD Jenelle Mis, FNP in 7 days   Get CBC, CMP, 2 view Chest X ray -  checked next visit with your primary MD    Activity: As tolerated with Full fall precautions use walker/cane & assistance as needed  Disposition Home   Diet: Heart Healthy    Special Instructions: If you have smoked or chewed Tobacco  in the last 2 yrs please stop smoking, stop any regular Alcohol  and or any Recreational drug use.  On your next visit with your primary care physician please Get Medicines reviewed and adjusted.  Please request your Prim.MD to go over all Hospital Tests and Procedure/Radiological results at the follow up, please get all Hospital records sent to your Prim MD by signing hospital release before you go home.  If you experience worsening of your admission symptoms, develop shortness of breath, life threatening emergency, suicidal or homicidal thoughts you must seek medical attention immediately by calling 911 or calling your MD immediately  if symptoms less severe.  You Must read complete instructions/literature along with all the possible  adverse reactions/side effects for all the Medicines you take and that have been prescribed to you. Take any new Medicines after you have completely understood and accpet all the possible adverse reactions/side effects.   Do not drive when taking Pain medications.  Do not take more than prescribed Pain, Sleep and Anxiety Medications  Wear Seat belts while driving.   Discharge wound care:   Complete by: As directed    Ostomy site care and wound VAC as instructed by general surgery   Increase activity slowly   Complete by: As directed    No wound care   Complete by: As directed        Discharge Medications   Allergies as of 10/19/2023  Reactions   Jasmine Oil Shortness Of Breath, Swelling   Levaquin  [levofloxacin ] Shortness Of Breath, Itching, Swelling, Rash   Penicillins Anaphylaxis, Hives   Tolerated ceftriaxone    Aleve [naproxen Sodium] Itching   Chlorhexidine     Unknown reaction   Depo-provera [medroxyprogesterone Acetate] Other (See Comments)   Severe acne   Doxycycline  Other (See Comments)   Raises blood pressure    Tramadol Other (See Comments)   Kept her up all night        Medication List     STOP taking these medications    amLODipine  5 MG tablet Commonly known as: NORVASC    metoCLOPramide  10 MG tablet Commonly known as: REGLAN    traZODone  50 MG tablet Commonly known as: DESYREL        TAKE these medications    acetaminophen  500 MG tablet Commonly known as: TYLENOL  Take 1 tablet (500 mg total) by mouth every 8 (eight) hours as needed for moderate pain (pain score 4-6) or mild pain (pain score 1-3). What changed:  how much to take when to take this reasons to take this   albuterol  108 (90 Base) MCG/ACT inhaler Commonly known as: VENTOLIN  HFA INHALE 2 PUFFS INTO THE LUNGS TWICE A DAY What changed:  how much to take how to take this when to take this reasons to take this additional instructions   ALPRAZolam  1 MG tablet Commonly known  as: XANAX  Take 1 tablet (1 mg total) by mouth 2 (two) times daily as needed for anxiety.   Aspirin  Low Dose 81 MG tablet Generic drug: aspirin  EC TAKE 1 TABLET BY MOUTH 2 TIMES DAILY. What changed:  how much to take when to take this   azelastine  0.1 % nasal spray Commonly known as: ASTELIN  Place 1 spray into both nostrils 2 (two) times daily. Use in each nostril as directed What changed:  when to take this reasons to take this additional instructions   citalopram  40 MG tablet Commonly known as: CELEXA  TAKE 1 TABLET BY MOUTH EVERY DAY   EPINEPHrine  0.3 mg/0.3 mL Soaj injection Commonly known as: EpiPen  2-Pak Inject 0.3 mg into the muscle as needed for anaphylaxis.   folic acid  1 MG tablet Commonly known as: FOLVITE  Take 1 tablet (1 mg total) by mouth daily.   HYDROcodone -acetaminophen  5-325 MG tablet Commonly known as: NORCO/VICODIN Take 1 tablet by mouth every 12 (twelve) hours as needed for severe pain (pain score 7-10).   ipratropium-albuterol  0.5-2.5 (3) MG/3ML Soln Commonly known as: DUONEB Use 1 vial by nebulization in the morning and at bedtime.   Magnesium  Citrate 100 MG Tabs Take 1 tablet by mouth at bedtime. What changed: how much to take   meclizine  25 MG tablet Commonly known as: ANTIVERT  Take 1 tablet (25 mg total) by mouth 3 (three) times daily as needed for dizziness.   melatonin 5 MG Tabs Take 1 tablet (5 mg total) by mouth at bedtime as needed (insomnia).   midodrine  5 MG tablet Commonly known as: PROAMATINE  Take 1 tablet (5 mg total) by mouth 2 (two) times daily with a meal.   omeprazole  40 MG capsule Commonly known as: PRILOSEC Take 1 capsule (40 mg total) by mouth 2 (two) times daily. Take 30 mins. Prior to breakfast and supper   ondansetron  4 MG tablet Commonly known as: Zofran  Take 1 tablet (4 mg total) by mouth every 8 (eight) hours as needed for nausea or vomiting. Take one tablet by mouth every 4-6 hours as needed What changed:  how much to take how to take this when to take this reasons to take this   potassium chloride  SA 20 MEQ tablet Commonly known as: KLOR-CON  M Take 1 tablet (20 mEq total) by mouth daily.   rizatriptan  10 MG tablet Commonly known as: MAXALT  TAKE 1 TABLET BY MOUTH AS NEEDED FOR MIGRAINE. MAY REPEAT IN 2 HOURS IF NEEDED STRENGTH: 10 MG   rOPINIRole  2 MG tablet Commonly known as: REQUIP  TAKE 1/2 TO 1 TABLETS BY MOUTH AT BEDTIME.   rosuvastatin  10 MG tablet Commonly known as: Crestor  Take 1 tablet (10 mg total) by mouth daily.   thiamine  100 MG tablet Commonly known as: Vitamin B-1 Take 1 tablet (100 mg total) by mouth daily.   Trelegy Ellipta  200-62.5-25 MCG/ACT Aepb Generic drug: Fluticasone -Umeclidin-Vilant TAKE 1 PUFF BY MOUTH EVERY DAY   triamcinolone  cream 0.5 % Commonly known as: KENALOG  Apply topically 2 (two) times daily. Apply to your back twice a day               Durable Medical Equipment  (From admission, onward)           Start     Ordered   10/18/23 1558  For home use only DME Bedside commode  Once       Question:  Patient needs a bedside commode to treat with the following condition  Answer:  Diverticulitis   10/18/23 1559   10/14/23 0733  For home use only DME Negative pressure wound device  Once       Question Answer Comment  Frequency of dressing change 2 times per week   Length of need 3 Months   Dressing type Foam   Amount of suction 125 mm/Hg   Pressure application Continuous pressure   Supplies 10 canisters and 15 dressings per month for duration of therapy      10/14/23 0732              Discharge Care Instructions  (From admission, onward)           Start     Ordered   10/19/23 0000  Discharge wound care:       Comments: Ostomy site care and wound VAC as instructed by general surgery   10/19/23 5409             Follow-up Information     Melvenia Stabs, MD. Call.   Specialties: General Surgery, Colon  and Rectal Surgery Why: We are working on scheduling a follow up appointment in about 4 weeks, please call to confirm appointment date/time. Contact information: [address] [place] [address] [phone]         Jenelle Mis, FNP. Schedule an appointment as soon as possible for a visit in 3 day(s).   Specialty: Family Medicine Contact information: [address] [place] [address] [phone]                 Major procedures and Radiology Reports - PLEASE review detailed and final reports thoroughly  -      CT ABDOMEN PELVIS W CONTRAST Result Date: 10/16/2023 CLINICAL DATA:  Abdominal pain postop EXAM: CT ABDOMEN AND PELVIS WITH CONTRAST TECHNIQUE: Multidetector CT imaging of the abdomen and pelvis was performed using the standard protocol following bolus administration of intravenous contrast. RADIATION DOSE REDUCTION: This exam was performed according to the departmental dose-optimization program which includes automated exposure control, adjustment of the mA and/or kV according to  patient size and/or use of iterative reconstruction technique. CONTRAST:  75mL OMNIPAQUE  IOHEXOL  350 MG/ML SOLN COMPARISON:  Oct 08, 2023 FINDINGS: Lower chest: Small bilateral pleural effusion with bilateral basilar infiltrates and atelectasis. Hepatobiliary: Small amount of ascites. No focal hepatic lesions. Prior cholecystectomy, no biliary dilatation. Pancreas: Unremarkable. No pancreatic ductal dilatation or surrounding inflammatory changes. Spleen: Normal in size without focal abnormality. Adrenals/Urinary Tract: Adrenal glands are unremarkable. Kidneys are normal, without renal calculi, focal lesion, or hydronephrosis. Bladder is unremarkable. Stomach/Bowel: No small or large bowel obstruction. There is small amount of ascites with some bowel edema involving several bowel loops in the left midportion of the abdomen better seen from images 27-48. Edema may be  secondary to the ascites. 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 No obstruction. No organized intra-abdominal abscesses or drainable fluid collections. Vascular/Lymphatic: No significant vascular findings are present. No enlarged abdominal or pelvic lymph nodes. Reproductive: Status post hysterectomy. No adnexal masses. Other: No abdominal wall hernia or abnormality. No abdominopelvic ascites. Nonspecific edema and stranding of the anterior abdominal wall without evidence of organized fluid collections. Musculoskeletal: No acute or significant osseous findings. IMPRESSION: *Small bilateral pleural effusion with bilateral basilar infiltrates and atelectasis. *Small amount of ascites. 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 No obstruction. No organized intra-abdominal abscesses or drainable fluid collections. *Nonspecific edema and stranding of the anterior abdominal wall without evidence of organized fluid collections. *There is small amount of ascites with some bowel edema involving several bowel loops in the left midportion of the abdomen better seen from. Edema may be secondary to the ascites. Electronically Signed   By: Fredrich Jefferson M.D.   On: 10/16/2023 14:21   DG Chest Port 1 View Result Date: 10/16/2023 CLINICAL DATA:  Shortness of breath EXAM: PORTABLE CHEST 1 VIEW COMPARISON:  Oct 08, 2023 FINDINGS: The heart size and mediastinal contours are within normal limits. Both lungs are clear. The visualized skeletal structures are unremarkable. IMPRESSION: No active disease. Electronically Signed   By: Fredrich Jefferson M.D.   On: 10/16/2023 08:15   DG Abd Portable 1V Result Date: 10/14/2023 CLINICAL DATA:  Postoperative ileus EXAM: PORTABLE ABDOMEN - 1 VIEW COMPARISON:  Abdominal x-ray 10/19/2020 FINDINGS: There is gaseous distention of the colon and central small bowel. There is no evidence for bowel obstruction. No dilated bowel loops are seen. Cholecystectomy clips are present. No free air. Lung bases  are clear. No acute osseous abnormality. IMPRESSION: Nonobstructive bowel gas pattern. Electronically Signed   By: Tyron Gallon M.D.   On: 10/14/2023 17:26   CT ABDOMEN PELVIS W CONTRAST Result Date: 10/08/2023 CLINICAL DATA:  Left lower quadrant pain. EXAM: CT ABDOMEN AND PELVIS WITH CONTRAST TECHNIQUE: Multidetector CT imaging of the abdomen and pelvis was performed using the standard protocol following bolus administration of intravenous contrast. RADIATION DOSE REDUCTION: This exam was performed according to the departmental dose-optimization program which includes automated exposure control, adjustment of the mA and/or kV according to patient size and/or use of iterative reconstruction technique. CONTRAST:  75mL OMNIPAQUE  IOHEXOL  350 MG/ML SOLN COMPARISON:  December 22, 2022 FINDINGS: Lower chest: Mild areas of lingular, right middle lobe and bibasilar linear scarring and/or atelectasis are seen. Hepatobiliary: There is diffuse fatty infiltration of the liver parenchyma. A 1.7 cm x 2.1 cm x 3.0 cm isodense liver lesion is seen within segment IV of the right lobe of the liver (approximately 45.15 Hounsfield units). The parenchyma throughout the remainder of the liver measures approximately 54.93 Hounsfield units. This area  is surrounded by a very thin hyperdense rim and is not clearly visualized on the prior study. Status post cholecystectomy. No biliary dilatation. Pancreas: Unremarkable. No pancreatic ductal dilatation or surrounding inflammatory changes. Spleen: Normal in size without focal abnormality. Adrenals/Urinary Tract: Adrenal glands are unremarkable. Kidneys are normal, without renal calculi, focal lesion, or hydronephrosis. Bladder is unremarkable. Stomach/Bowel: Stomach is within normal limits. The appendix is surgically absent. No evidence of bowel dilatation. Numerous diverticula are seen throughout the descending and sigmoid colon. The mid sigmoid colon is also markedly thickened and inflamed.  Mildly inflamed loops of adjacent distal ileum are seen. There is no evidence of associated perforation or abscess. Vascular/Lymphatic: Aortic atherosclerosis. No enlarged abdominal or pelvic lymph nodes. Reproductive: Status post hysterectomy. No adnexal masses. Other: No abdominal wall hernia or abnormality. There is a small amount of pelvic free fluid. Musculoskeletal: Degenerative changes are seen within the lower lumbar spine. IMPRESSION: 1. Marked severity sigmoid diverticulitis without associated perforation or abscess. 2. Mildly inflamed loops of adjacent distal ileum. 3. Hepatic steatosis. 4. Isodense liver lesion, as described above, which represents a new finding when compared to the prior study. Nonemergent MRI follow-up is recommended to further exclude the presence of an underlying neoplastic process. 5. Evidence of prior cholecystectomy, appendectomy and hysterectomy. 6. Aortic atherosclerosis. Electronically Signed   By: Virgle Grime M.D.   On: 10/08/2023 20:45   DG Chest Port 1 View Result Date: 10/08/2023 CLINICAL DATA:  Questionable sepsis - evaluate for abnormality Abdominal and back pain. EXAM: PORTABLE CHEST 1 VIEW COMPARISON:  Chest CT 07/11/2022 FINDINGS: Chronic hyperinflation and bronchial thickening. No focal airspace disease. The heart is normal in size. No pulmonary edema, pleural effusion, or pneumothorax. No acute osseous findings IMPRESSION: Chronic hyperinflation and bronchial thickening. No acute findings. Electronically Signed   By: Chadwick Colonel M.D.   On: 10/08/2023 18:22    Micro Results    Recent Results (from the past 240 hours)  MRSA Next Gen by PCR, Nasal     Status: None   Collection Time: 10/09/23  5:22 PM   Specimen: STOOL; Nasal Swab  Result Value Ref Range Status   MRSA by PCR Next Gen NOT DETECTED NOT DETECTED Final    Comment: (NOTE) The GeneXpert MRSA Assay (FDA approved for NASAL specimens only), is one component of a comprehensive MRSA  colonization surveillance program. It is not intended to diagnose MRSA infection nor to guide or monitor treatment for MRSA infections. Test performance is not FDA approved in patients less than 13 years old. Performed at [facility], [address], [place], [place] [address]     Today   Subjective    Vicki [name] today has no headache,no chest abdominal pain,no new weakness tingling or numbness, feels much better wants to go home today.    Objective   Blood pressure 105/75, pulse 80, temperature 98.3 F (36.8 C), temperature source Oral, resp. rate 12, height 5\' 3"  (1.6 m), weight 90.7 kg, SpO2 97%.   Intake/Output Summary (Last 24 hours) at 10/19/2023 0732 Last data filed at 10/19/2023 0620 Gross per 24 hour  Intake 1222.27 ml  Output 1650 ml  Net -427.73 ml    Exam  Awake Alert, No new F.N deficits,    [place].AT,PERRAL Supple Neck,   Symmetrical Chest wall movement, Good air movement bilaterally, CTAB RRR,No Gallops,   +ve B.Sounds, Abd Soft, Non tender, ostomy stable with liquid stool No Cyanosis, Clubbing or edema    Data Review  Recent Labs  Lab 10/14/23 0337 10/15/23 0318 10/16/23 0318 10/17/23 0920 10/18/23 0814  WBC 10.1 11.6* 16.4* 13.2* 14.9*  HGB 9.9* 9.0* 9.4* 10.2* 9.7*  HCT 29.3* 26.6* 28.6* 31.0* 29.0*  PLT 231 285 331 350 360  MCV 94.8 94.7 95.0 94.8 95.4  MCH 32.0 32.0 31.2 31.2 31.9  MCHC 33.8 33.8 32.9 32.9 33.4  RDW 13.2 13.4 13.6 13.9 14.0  LYMPHSABS 1.2 1.7 0.7 1.5 1.6  MONOABS 1.6* 1.7* 3.1* 0.8 1.5*  EOSABS 0.4 0.5 0.3 0.5 0.4  BASOSABS 0.0 0.0 0.0 0.1 0.1    Recent Labs  Lab 10/14/23 0337 10/15/23 0318 10/16/23 0318 10/16/23 0750 10/17/23 0920 10/18/23 0608 10/18/23 0814  NA 132* 131* 133*  --  132* 133*  --   K 3.7 3.9 3.9  --  3.6 3.9  --   CL 97* 99 102  --  101 103  --   CO2 25 29 25   --  24 18*  --   ANIONGAP 10 3* 6  --  7 12  --   GLUCOSE 96 106* 92  --  127* 85  --   BUN 6 <5* 6  --  6 6  --    CREATININE 0.55 0.55 0.63  --  0.52 0.55  --   AST  --   --   --   --  25 30  --   ALT  --   --   --   --  9 9  --   ALKPHOS  --   --   --   --  120 129*  --   BILITOT  --   --   --   --  0.7 0.5  --   ALBUMIN   --   --   --   --  1.7* 1.9*  --   CRP  --   --   --  15.3* 12.5* 11.8*  --   PROCALCITON  --   --  7.47  --  4.13  --  2.19  MG 1.2* 1.8 1.5*  --  1.6* 2.1  --   PHOS 2.8 2.5 2.6  --  2.2* 2.4*  --   CALCIUM  7.7* 7.4* 7.6*  --  7.8* 8.0*  --     Total Time in preparing paper work, data evaluation and todays exam - 35 minutes  Signature  -    Lynnwood Sauer M.D on 10/19/2023 at 7:32 AM   -  To page go to [email or web address]

## 2023-10-19 NOTE — TOC Transition Note (Addendum)
 Transition of Care [facility]) - Discharge Note   Patient Details  Name: Vicki [name] MRN: [id number] Date of Birth: [birth year]  [facility] CM/SW Contact:  Omie Bickers, RN Phone Number: 10/19/2023, 7:42 AM   Clinical Narrative:     Spoke w patient, she confirms that [employer] has delivered the wound vac for home to her room. She states they have not delivered the 3/1 and instead of waiting for [employer] to open in over an hour and then wait for delivery to the room she would rather pick it up at [facility] on her way home.  [facility] notified of DC and states they will do [facility] on Monday. Patient states her spouse will be ride home.  Portable tank requested through [employer] and nurse from [facility] requested to assist with converting [employer] V[facility] to [facility]  Final next level of care: Home w Home Health Services Barriers to Discharge: No Barriers Identified   Patient Goals and CMS Choice Patient states their goals for this hospitalization and ongoing recovery are:: to return home          Discharge Placement                       Discharge Plan and Services Additional resources added to the After Visit Summary for   In-house Referral: Clinical Social Work              DME Arranged: Vac DME Agency: [employer]       HH Arranged: RN HH Agency: [facility] Date HH Agency Contacted: 10/19/23 Time HH Agency Contacted: [phone] Representative spoke with at [facility] Agency: Amy  Social Drivers of Health (SDOH) Interventions SDOH Screenings   Food Insecurity: No Food Insecurity (10/12/2023)  Housing: Unknown (10/12/2023)  Transportation Needs: No Transportation Needs (10/12/2023)  Utilities: Not At Risk (10/12/2023)  Alcohol Screen: Low Risk  (11/01/2022)  Depression (PHQ2-9): High Risk (04/25/2023)  Financial Resource Strain: Low Risk  (11/01/2022)  Physical Activity: Inactive (11/01/2022)  Social Connections: Moderately Isolated (11/01/2022)  Stress: No Stress Concern Present  (11/01/2022)  Tobacco Use: High Risk (10/10/2023)     Readmission Risk Interventions     No data to display

## 2023-10-21 ENCOUNTER — Telehealth: Payer: Self-pay | Admitting: *Deleted

## 2023-10-21 ENCOUNTER — Telehealth: Payer: Self-pay

## 2023-10-21 NOTE — Telephone Encounter (Signed)
 Copied from CRM [id number]. Topic: Appointments - Scheduling Inquiry for Clinic >> Oct 21, 2023 10:22 AM Felicitas Horse wrote: Reason for CRM: Pt husband wayne called in and stated pt was discharged from hospital on Saturday 10/19/2023 and was told to schedule a hospital follow up with primary int he next 3 days, next available is 06/19 and pt needs something sooner. Please reach out and schedule.    Callback [phone]

## 2023-10-21 NOTE — Transitions of Care (Post Inpatient/ED Visit) (Signed)
   10/21/2023  Name: Vicki [name] MRN: [id number] DOB: August 17, 1965  Today's TOC FU Call Status: Today's TOC FU Call Status:: Unsuccessful Call (1st Attempt) Unsuccessful Call (1st Attempt) Date: 10/21/23  Attempted to reach the patient regarding the most recent Inpatient/ED visit.  Follow Up Plan: Additional outreach attempts will be made to reach the patient to complete the Transitions of Care (Post Inpatient/ED visit) call.   Cecilie Coffee [facility], BSN RN Care Manager/ Transition of Care [place] [facility]/ [facility] [phone]

## 2023-10-22 ENCOUNTER — Telehealth: Payer: Self-pay | Admitting: *Deleted

## 2023-10-22 DIAGNOSIS — Z433 Encounter for attention to colostomy: Secondary | ICD-10-CM | POA: Diagnosis not present

## 2023-10-22 DIAGNOSIS — F1020 Alcohol dependence, uncomplicated: Secondary | ICD-10-CM | POA: Diagnosis not present

## 2023-10-22 DIAGNOSIS — A419 Sepsis, unspecified organism: Secondary | ICD-10-CM | POA: Diagnosis not present

## 2023-10-22 DIAGNOSIS — Z4801 Encounter for change or removal of surgical wound dressing: Secondary | ICD-10-CM | POA: Diagnosis not present

## 2023-10-22 DIAGNOSIS — J4489 Other specified chronic obstructive pulmonary disease: Secondary | ICD-10-CM | POA: Diagnosis not present

## 2023-10-22 DIAGNOSIS — J439 Emphysema, unspecified: Secondary | ICD-10-CM | POA: Diagnosis not present

## 2023-10-22 DIAGNOSIS — Z48815 Encounter for surgical aftercare following surgery on the digestive system: Secondary | ICD-10-CM | POA: Diagnosis not present

## 2023-10-22 DIAGNOSIS — J9611 Chronic respiratory failure with hypoxia: Secondary | ICD-10-CM | POA: Diagnosis not present

## 2023-10-22 DIAGNOSIS — R6520 Severe sepsis without septic shock: Secondary | ICD-10-CM | POA: Diagnosis not present

## 2023-10-22 NOTE — Transitions of Care (Post Inpatient/ED Visit) (Signed)
   10/22/2023  Name: Vicki [name] MRN: [id number] DOB: 12-10-1965  Today's TOC FU Call Status: Today's TOC FU Call Status:: Unsuccessful Call (2nd Attempt) Unsuccessful Call (2nd Attempt) Date: 10/22/23  Attempted to reach the patient regarding the most recent Inpatient/ED visit.  Follow Up Plan: Additional outreach attempts will be made to reach the patient to complete the Transitions of Care (Post Inpatient/ED visit) call.   Una Ganser BSN RN [facility] Care Management Coordinator [email or web address] Direct Dial: [phone]  Fax: [phone] Website: [email or web address]

## 2023-10-23 ENCOUNTER — Telehealth: Payer: Self-pay

## 2023-10-23 NOTE — Transitions of Care (Post Inpatient/ED Visit) (Signed)
   10/23/2023  Name: Vicki [name] MRN: [id number] DOB: November 27, 1965  Today's TOC FU Call Status: Today's TOC FU Call Status:: Unsuccessful Call (3rd Attempt) Unsuccessful Call (3rd Attempt) Date: 10/23/23  Attempted to reach the patient regarding the most recent Inpatient/ED visit.  Follow Up Plan: No further outreach attempts will be made at this time. We have been unable to contact the patient.  Legna Mausolf J. Laymond Postle RN, MSN [facility], [facility] RN Care Manager Direct Dial: [phone]  Fax: [phone] Website: [email or web address]

## 2023-10-25 ENCOUNTER — Other Ambulatory Visit: Payer: Self-pay | Admitting: Family Medicine

## 2023-10-25 ENCOUNTER — Encounter: Payer: Self-pay | Admitting: Family Medicine

## 2023-10-25 ENCOUNTER — Ambulatory Visit: Admitting: Family Medicine

## 2023-10-25 VITALS — BP 98/64 | HR 111 | Temp 97.9°F

## 2023-10-25 DIAGNOSIS — J441 Chronic obstructive pulmonary disease with (acute) exacerbation: Secondary | ICD-10-CM

## 2023-10-25 DIAGNOSIS — Z48815 Encounter for surgical aftercare following surgery on the digestive system: Secondary | ICD-10-CM | POA: Diagnosis not present

## 2023-10-25 DIAGNOSIS — J449 Chronic obstructive pulmonary disease, unspecified: Secondary | ICD-10-CM

## 2023-10-25 DIAGNOSIS — F1020 Alcohol dependence, uncomplicated: Secondary | ICD-10-CM | POA: Diagnosis not present

## 2023-10-25 DIAGNOSIS — K5720 Diverticulitis of large intestine with perforation and abscess without bleeding: Secondary | ICD-10-CM

## 2023-10-25 DIAGNOSIS — Z433 Encounter for attention to colostomy: Secondary | ICD-10-CM | POA: Diagnosis not present

## 2023-10-25 DIAGNOSIS — A419 Sepsis, unspecified organism: Secondary | ICD-10-CM | POA: Diagnosis not present

## 2023-10-25 DIAGNOSIS — G2581 Restless legs syndrome: Secondary | ICD-10-CM

## 2023-10-25 DIAGNOSIS — D649 Anemia, unspecified: Secondary | ICD-10-CM

## 2023-10-25 DIAGNOSIS — R6520 Severe sepsis without septic shock: Secondary | ICD-10-CM | POA: Diagnosis not present

## 2023-10-25 DIAGNOSIS — F32A Depression, unspecified: Secondary | ICD-10-CM

## 2023-10-25 DIAGNOSIS — K769 Liver disease, unspecified: Secondary | ICD-10-CM

## 2023-10-25 DIAGNOSIS — G43909 Migraine, unspecified, not intractable, without status migrainosus: Secondary | ICD-10-CM

## 2023-10-25 DIAGNOSIS — J9611 Chronic respiratory failure with hypoxia: Secondary | ICD-10-CM | POA: Diagnosis not present

## 2023-10-25 DIAGNOSIS — Z4801 Encounter for change or removal of surgical wound dressing: Secondary | ICD-10-CM | POA: Diagnosis not present

## 2023-10-25 DIAGNOSIS — E876 Hypokalemia: Secondary | ICD-10-CM

## 2023-10-25 DIAGNOSIS — R32 Unspecified urinary incontinence: Secondary | ICD-10-CM

## 2023-10-25 DIAGNOSIS — R42 Dizziness and giddiness: Secondary | ICD-10-CM

## 2023-10-25 DIAGNOSIS — Z87892 Personal history of anaphylaxis: Secondary | ICD-10-CM

## 2023-10-25 DIAGNOSIS — J4489 Other specified chronic obstructive pulmonary disease: Secondary | ICD-10-CM | POA: Diagnosis not present

## 2023-10-25 DIAGNOSIS — J439 Emphysema, unspecified: Secondary | ICD-10-CM | POA: Diagnosis not present

## 2023-10-25 MED ORDER — ALPRAZOLAM 1 MG PO TABS: 1.0000 mg | ORAL_TABLET | Freq: Two times a day (BID) | ORAL | 0 refills | Status: DC | PRN

## 2023-10-25 MED ORDER — ONDANSETRON HCL 8 MG PO TABS: 8.0000 mg | ORAL_TABLET | Freq: Three times a day (TID) | ORAL | 0 refills | Status: DC | PRN

## 2023-10-25 MED ORDER — HYDROCODONE-ACETAMINOPHEN 5-325 MG PO TABS: 1.0000 | ORAL_TABLET | Freq: Two times a day (BID) | ORAL | 0 refills | Status: DC | PRN

## 2023-10-25 NOTE — Progress Notes (Signed)
 Patient Office Visit  Assessment & Plan:  Sepsis, due to unspecified organism, unspecified whether acute organ dysfunction present (HCC) -     CBC with Differential/Platelet  COPD exacerbation (HCC)  Diverticulitis of large intestine with perforation and abscess without bleeding  Liver lesion  Anemia, unspecified type -     Comprehensive metabolic panel with GFR -     CBC with Differential/Platelet -     TSH -     Vitamin B12 -     Iron, TIBC and Ferritin Panel  Depression, unspecified depression type -     ALPRAZolam ; Take 1 tablet (1 mg total) by mouth 2 (two) times daily as needed for anxiety.  Dispense: 30 tablet; Refill: 0  Urinary incontinence, unspecified type  Other orders -     Ondansetron  HCl; Take 1 tablet (8 mg total) by mouth every 8 (eight) hours as needed for nausea or vomiting.  Dispense: 20 tablet; Refill: 0 -     HYDROcodone -Acetaminophen ; Take 1 tablet by mouth every 12 (twelve) hours as needed for severe pain (pain score 7-10).  Dispense: 15 tablet; Refill: 0   Assessment and Plan    Diverticulitis with perforation and sepsis Admitted with diverticulitis, perforation, sepsis, and peritonitis. Underwent Hartman's procedure. Treated with antibiotics. Wound vac and colostomy in place. Reports improvement but still has chills and pain. Slight anemia persists. - Continue home health care visits three times a week. - Complete the last dose of antibiotics. - Follow up with the surgeon on July 1. - Monitor for signs of infection or complications. - Repeat CBC to monitor anemia. - Ensure adequate nutrition with high fiber diet and supplements like Ensure.  Chronic Obstructive Pulmonary Disease (COPD) Managed with oxygen and Trelegy Ellipta . Quit smoking. Experienced shortness of breath and intubation post-surgery. - Continue Trelegy Ellipta  daily. - Maintain oxygen therapy at four liters. - Follow up with pulmonologist Dr. Susi Eric on June 26. - Encourage  continued smoking cessation.  Anemia Slight anemia with hemoglobin improved from 9 to 9.7. Contributes to fatigue post-sepsis. - Repeat CBC to monitor anemia. - Ensure adequate iron intake through diet and supplements.  Liver lesion Liver lesion identified on CT, measuring 1.7 cm by 3 cm. Not urgent. Fatty liver present. - Schedule MRI to evaluate liver lesion in the future.  Urinary incontinence Experiences nocturnal urinary incontinence. Considering PureWik system. No urologist consultation or medication tried. Will defer this to Yolanda Hence NP her PCP - Discuss urinary incontinence management with home health nurse. - Consider referral to urologist if needed. - Evaluate potential use of PureWik system.  General Health Maintenance Advised on high fiber diet to prevent diverticulitis. Informed about fatty liver and need for lung cancer screening due to smoking history. - Encourage high fiber diet. - Continue annual lung cancer screening with CT scan.       Test results were reviewed and analyzed as part of the medical decision making of this visit.  Reviewed hospital notes lab work diagnostic studies discharge summary during the office visit.  Patient is aware she should not take the Xanax  and hydrocodone  at the same time. Follow up with Amber in the next month or so.  Will forward the request to [facility] re PureWick per patient/husband.  Not sure how this will be set up.  Return in about 4 weeks (around 11/22/2023), or if symptoms worsen or fail to improve.   Subjective:    Patient ID: Vicki [name], female    DOB: [birth year]  Age: 58 y.o. MRN: [id number]  Chief Complaint  Patient presents with   Hospitalization Follow-up    Pt would like to get purewick system at home.   Medication Refill    Pt would like to know if she can possibly get more pain medication.    Medication Refill   Discussed the use of AI scribe software for clinical note transcription with the  patient, who gave verbal consent to proceed.  History of Present Illness        Vicki [name] is a 58 year old female with a history of diverticulitis who presents for post-hospitalization follow-up after treatment for diverticulitis with perforation and sepsis.  She was admitted to the hospital on Oct 08, 2023, with severe abdominal pain, fever, and chills. She was diagnosed with diverticulitis with perforation, leading to sepsis and peritonitis. She underwent emergency surgery, specifically Hartman's procedure, on Oct 10, 2023. She was discharged on October 19, 2023, after a stay in the ICU and is currently on a wound vac and colostomy bag. she hopes the colostomy bag will be reversed in 6 months.   She has a history of recurrent diverticulitis, having experienced five episodes in the past few years, and has been treated with multiple courses of antibiotics. This is the first time she experienced a perforation/abscess/sepsis. Previous episodes were managed with antibiotics, which provided partial relief.  She is currently on her last dose of oral antibiotics following her hospital discharge. She experiences chills but no fever. She also reports pain, particularly when moving from her house to the car, and is using hydrocodone  for pain management, taking one every twelve hours. She is also on Xanax  for anxiety, taking one milligram once a day (trying to reduce the dosage over time)  She has a history of COPD and uses inhalers, including Trelegy Ellipta  daily, and a nebulizer as needed. She quit smoking on the day of her hospital admission. She is on four liters of oxygen at home, which is her baseline.  She reports being anemic with a hemoglobin level that dropped to nine during her hospital stay but has improved to 9.7. She experiences fatigue, which she attributes to her recent severe infection and hospitalization. She is also experiencing nausea twice a day, managed with Zofran  4 mg as needed.  Nausea has been present for a while.   She has a history of urinary incontinence, primarily at night. She reports a significant weight discrepancy noted in her medical records, with a recent weight of 168 pounds, down from a recorded 199 pounds, which she believes was an error.  She has a history of fatty liver and a liver lesion was noted on a CT scan during her recent hospitalization. She has arthritis. Physical Exam MEASUREMENTS: Weight- 168. Results LABS Hb: 9.7 WBC: elevated Blood cultures: negative C diff: negative  RADIOLOGY Chest x-ray: normal (10/16/2023) CT scan (10/08/2023): 1.7 cm x 3 cm liver lesion, fatty liver CT scan (10/16/2023): no mention of liver lesion, fatty liver Assessment & Plan Diverticulitis with perforation and sepsis Admitted with diverticulitis, perforation, sepsis, and peritonitis. Underwent Hartman's procedure. Treated with antibiotics. Wound vac and colostomy in place. Reports improvement but still has chills and pain. Slight anemia persists. - Continue home health care visits three times a week. - Complete the last dose of antibiotics. - Follow up with the surgeon on July 1. - Monitor for signs of infection or complications. - Repeat CBC to monitor anemia and iron studies - Ensure adequate nutrition with  high fiber diet and supplements like Ensure.  Chronic Obstructive Pulmonary Disease (COPD) Managed with oxygen and Trelegy Ellipta . Quit smoking. Experienced shortness of breath and intubation post-surgery. - Continue Trelegy Ellipta  daily. - Maintain oxygen therapy at four liters. - Follow up with pulmonologist Dr. Susi Eric on June 26. - Encourage continued smoking cessation.  Anemia Slight anemia with hemoglobin improved from 9 to 9.7. Contributes to fatigue post-sepsis. - Repeat CBC to monitor anemia. - Ensure adequate iron intake through diet and supplements.  Liver lesion seen on CT scan- reviewed today Liver lesion identified on CT,  measuring 1.7 cm by 3 cm. Not urgent. Fatty liver present. - Schedule MRI to evaluate liver lesion in the future.  Urinary incontinence Experiences nocturnal urinary incontinence. Considering PureWik system. No urologist consultation or medication tried. Will defer this to Yolanda Hence NP her PCP - Discuss urinary incontinence management with home health nurse. - Consider referral to urologist if needed. - Evaluate potential use of PureWik system.  General Health Maintenance Advised on high fiber diet to prevent diverticulitis. Informed about fatty liver and need for lung cancer screening due to smoking history. - Encourage high fiber diet. - Continue annual lung cancer screening with CT scan. IMPRESSION: CT scan reviewed 1. Marked severity sigmoid diverticulitis without associated perforation or abscess. 2. Mildly inflamed loops of adjacent distal ileum. 3. Hepatic steatosis. 4. Isodense liver lesion, as described above, which represents a new finding when compared to the prior study. Nonemergent MRI follow-up is recommended to further exclude the presence of an underlying neoplastic process. 5. Evidence of prior cholecystectomy, appendectomy and hysterectomy. 6. Aortic atherosclerosis.     Electronically Signed   By: Virgle Grime M.D.   On: 10/08/2023 20:45    The 10-year ASCVD risk score (Arnett DK, et al., 2019) is: 3.8%  Past Medical History:  Diagnosis Date   Allergy    seasonal allergies   Anxiety    on meds   Arthritis    hands   Asthma    childhood-current   CAP (community acquired pneumonia) 04/07/2019   Carpal tunnel syndrome    had bilateral sx to tx   Cataract    bilateral--no sx yet   COPD (chronic obstructive pulmonary disease) (HCC)    Depression    on meds   Diverticulitis    Emphysema of lung (HCC)    uses MDI   Essential hypertension, benign 07/14/2019   on meds   Hyperlipidemia    on meds   Oxygen deficiency    on 5 L cont [place]    Oxygen dependent    on 5 L cont [place]   Past Surgical History:  Procedure Laterality Date   ABDOMINAL HYSTERECTOMY  2009   APPENDECTOMY     ARTHROSCOPIC REPAIR ACL Left 2011/2014   x 2   BIOPSY  01/21/2020   Procedure: BIOPSY;  Surgeon: Sergio Dandy, MD;  Location: [facility];  Service: Endoscopy;;  EGD and COLON   BUNIONECTOMY  2006   both   CARPAL TUNNEL RELEASE Bilateral 10/28/2013   Procedure: BILATERAL CARPAL TUNNEL RELEASE;  Surgeon: Kemp Patter, MD;  Location: [facility];  Service: Orthopedics;  Laterality: Bilateral;   CHOLECYSTECTOMY  1994   CHONDROPLASTY Right 08/31/2020   Procedure: CHONDROPLASTY;  Surgeon: Marlena Sima, MD;  Location:  SURGERY CENTER;  Service: Orthopedics;  Laterality: Right;   COLONOSCOPY WITH PROPOFOL  N/A 01/21/2020   Procedure: COLONOSCOPY WITH PROPOFOL ;  Surgeon: Sergio Dandy, MD;  Location: [facility];  Service: Endoscopy;  Laterality: N/A;   DILATION AND CURETTAGE OF UTERUS     ESOPHAGOGASTRODUODENOSCOPY (EGD) WITH PROPOFOL  N/A 01/21/2020   Procedure: ESOPHAGOGASTRODUODENOSCOPY (EGD) WITH PROPOFOL ;  Surgeon: Sergio Dandy, MD;  Location: [facility];  Service: Endoscopy;  Laterality: N/A;   KNEE ARTHROSCOPY WITH ANTERIOR CRUCIATE LIGAMENT (ACL) REPAIR Right 08/31/2020   Procedure: RIGHT KNEE ARTHROSCOPY WITH ANTERIOR CRUCIATE LIGAMENT (ACL) REPAIR MEDIAL AND LATERAL  MENISECTOMY;  Surgeon: Marlena Sima, MD;  Location: [facility];  Service: Orthopedics;  Laterality: Right;  FEMORAL NERVE BLOCK   LAPAROTOMY N/A 10/10/2023   Procedure: EXPLORATORY LAPAROTOMY, PARTIAL COLECTOMY AND COLOSTOMY;  Surgeon: Melvenia Stabs, MD;  Location: [facility];  Service: General;  Laterality: N/A;  EXPLORATORY LAPAROTOMY , PARTIAL COLECTOMY AND COLOSTOMY   POLYPECTOMY  01/21/2020   Procedure: POLYPECTOMY;  Surgeon: Sergio Dandy, MD;  Location: [facility];  Service: Endoscopy;;   TUBAL LIGATION  2004    Social History   Tobacco Use   Smoking status: Every Day    Current packs/day: 1.00    Average packs/day: 1 pack/day for 34.0 years (34.0 ttl pk-yrs)    Types: Cigarettes   Smokeless tobacco: Never   Tobacco comments:    6 cigarettes a day   Vaping Use   Vaping status: Never Used  Substance Use Topics   Alcohol use: Yes    Comment: everyday   Drug use: No   Family History  Problem Relation Age of Onset   Brain cancer Mother 83   Lung cancer Mother 62   Breast cancer Sister 110   Ovarian cancer Sister 36   Colon cancer Neg Hx    Colon polyps Neg Hx    Esophageal cancer Neg Hx    Rectal cancer Neg Hx    Stomach cancer Neg Hx    Allergies  Allergen Reactions   Jasmine Oil Shortness Of Breath and Swelling   Levaquin  [Levofloxacin ] Shortness Of Breath, Itching, Swelling and Rash   Penicillins Anaphylaxis and Hives    Tolerated ceftriaxone    Aleve [Naproxen Sodium] Itching   Chlorhexidine      Unknown reaction   Depo-Provera [Medroxyprogesterone Acetate] Other (See Comments)    Severe acne   Doxycycline  Other (See Comments)    Raises blood pressure    Tramadol Other (See Comments)    Kept her up all night    ROS    Objective:    BP 98/64   Pulse (!) 111   Temp 97.9 F (36.6 C)   SpO2 93%  BP Readings from Last 3 Encounters:  10/25/23 98/64  10/19/23 104/70  04/23/23 98/70   Wt Readings from Last 3 Encounters:  10/16/23 199 lb 15.3 oz (90.7 kg)  04/23/23 166 lb (75.3 kg)  03/04/23 166 lb (75.3 kg)    Physical Exam Vitals and nursing note reviewed.  Constitutional:      General: She is not in acute distress.    Appearance: Normal appearance.     Comments: Comes in with her husband in a wheelchair with oxygen via nasal cannula.  Patient also has a wound VAC and ostomy bag  HENT:     Head: Normocephalic.     Right Ear: Tympanic membrane, ear canal and external ear normal.     Left Ear: Tympanic membrane, ear canal and external ear normal.   Eyes:      Extraocular Movements: Extraocular movements intact.     Conjunctiva/sclera: Conjunctivae normal.  Pupils: Pupils are equal, round, and reactive to light.    Cardiovascular:     Rate and Rhythm: Regular rhythm. Tachycardia present.     Heart sounds: Normal heart sounds.  Pulmonary:     Effort: Pulmonary effort is normal. No respiratory distress.     Breath sounds: Normal breath sounds. No wheezing.  Abdominal:     Tenderness: There is no abdominal tenderness.     Comments: Wound vac and ostomy bag with brownish stool   Musculoskeletal:     Right lower leg: No edema.     Left lower leg: No edema.   Neurological:     General: No focal deficit present.     Mental Status: She is alert and oriented to person, place, and time.   Psychiatric:        Mood and Affect: Mood normal.        Behavior: Behavior normal.      No results found for any visits on 10/25/23.

## 2023-10-26 LAB — CBC WITH DIFFERENTIAL/PLATELET
Absolute Lymphocytes: 1238 {cells}/uL (ref 850–3900)
Absolute Monocytes: 1426 {cells}/uL — ABNORMAL HIGH (ref 200–950)
Basophils Absolute: 79 {cells}/uL (ref 0–200)
Basophils Relative: 0.8 %
Eosinophils Absolute: 248 {cells}/uL (ref 15–500)
Eosinophils Relative: 2.5 %
HCT: 28.8 % — ABNORMAL LOW (ref 35.0–45.0)
Hemoglobin: 9.1 g/dL — ABNORMAL LOW (ref 11.7–15.5)
MCH: 30.4 pg (ref 27.0–33.0)
MCHC: 31.6 g/dL — ABNORMAL LOW (ref 32.0–36.0)
MCV: 96.3 fL (ref 80.0–100.0)
MPV: 9.7 fL (ref 7.5–12.5)
Monocytes Relative: 14.4 %
Neutro Abs: 6910 {cells}/uL (ref 1500–7800)
Neutrophils Relative %: 69.8 %
Platelets: 586 10*3/uL — ABNORMAL HIGH (ref 140–400)
RBC: 2.99 10*6/uL — ABNORMAL LOW (ref 3.80–5.10)
RDW: 12.5 % (ref 11.0–15.0)
Total Lymphocyte: 12.5 %
WBC: 9.9 10*3/uL (ref 3.8–10.8)

## 2023-10-26 LAB — COMPREHENSIVE METABOLIC PANEL WITH GFR
AG Ratio: 0.8 (calc) — ABNORMAL LOW (ref 1.0–2.5)
ALT: 4 U/L — ABNORMAL LOW (ref 6–29)
AST: 28 U/L (ref 10–35)
Albumin: 2.5 g/dL — ABNORMAL LOW (ref 3.6–5.1)
Alkaline phosphatase (APISO): 97 U/L (ref 37–153)
BUN/Creatinine Ratio: 10 (calc) (ref 6–22)
BUN: 5 mg/dL — ABNORMAL LOW (ref 7–25)
CO2: 29 mmol/L (ref 20–32)
Calcium: 7.5 mg/dL — ABNORMAL LOW (ref 8.6–10.4)
Chloride: 98 mmol/L (ref 98–110)
Creat: 0.49 mg/dL — ABNORMAL LOW (ref 0.50–1.03)
Globulin: 3.3 g/dL (ref 1.9–3.7)
Glucose, Bld: 106 mg/dL — ABNORMAL HIGH (ref 65–99)
Potassium: 4.6 mmol/L (ref 3.5–5.3)
Sodium: 134 mmol/L — ABNORMAL LOW (ref 135–146)
Total Bilirubin: 0.3 mg/dL (ref 0.2–1.2)
Total Protein: 5.8 g/dL — ABNORMAL LOW (ref 6.1–8.1)
eGFR: 111 mL/min/{1.73_m2} (ref >=60)

## 2023-10-26 LAB — IRON,TIBC AND FERRITIN PANEL
%SAT: 15 % — ABNORMAL LOW (ref 16–45)
Ferritin: 777 ng/mL — ABNORMAL HIGH (ref 16–232)
Iron: 18 ug/dL — ABNORMAL LOW (ref 45–160)
TIBC: 124 ug/dL — ABNORMAL LOW (ref 250–450)

## 2023-10-26 LAB — VITAMIN B12: Vitamin B-12: 545 pg/mL (ref 200–1100)

## 2023-10-26 LAB — TSH: TSH: 24.19 m[IU]/L — ABNORMAL HIGH (ref 0.40–4.50)

## 2023-10-28 ENCOUNTER — Ambulatory Visit: Payer: Self-pay | Admitting: Family Medicine

## 2023-10-28 DIAGNOSIS — J9611 Chronic respiratory failure with hypoxia: Secondary | ICD-10-CM | POA: Diagnosis not present

## 2023-10-28 DIAGNOSIS — A419 Sepsis, unspecified organism: Secondary | ICD-10-CM | POA: Diagnosis not present

## 2023-10-28 DIAGNOSIS — J4489 Other specified chronic obstructive pulmonary disease: Secondary | ICD-10-CM | POA: Diagnosis not present

## 2023-10-28 DIAGNOSIS — R6520 Severe sepsis without septic shock: Secondary | ICD-10-CM | POA: Diagnosis not present

## 2023-10-28 DIAGNOSIS — J439 Emphysema, unspecified: Secondary | ICD-10-CM | POA: Diagnosis not present

## 2023-10-28 DIAGNOSIS — Z433 Encounter for attention to colostomy: Secondary | ICD-10-CM | POA: Diagnosis not present

## 2023-10-28 DIAGNOSIS — Z4801 Encounter for change or removal of surgical wound dressing: Secondary | ICD-10-CM | POA: Diagnosis not present

## 2023-10-28 DIAGNOSIS — F1020 Alcohol dependence, uncomplicated: Secondary | ICD-10-CM | POA: Diagnosis not present

## 2023-10-28 DIAGNOSIS — Z48815 Encounter for surgical aftercare following surgery on the digestive system: Secondary | ICD-10-CM | POA: Diagnosis not present

## 2023-10-29 DIAGNOSIS — J4489 Other specified chronic obstructive pulmonary disease: Secondary | ICD-10-CM | POA: Diagnosis not present

## 2023-10-29 DIAGNOSIS — Z4801 Encounter for change or removal of surgical wound dressing: Secondary | ICD-10-CM | POA: Diagnosis not present

## 2023-10-29 DIAGNOSIS — R6520 Severe sepsis without septic shock: Secondary | ICD-10-CM | POA: Diagnosis not present

## 2023-10-29 DIAGNOSIS — Z48815 Encounter for surgical aftercare following surgery on the digestive system: Secondary | ICD-10-CM | POA: Diagnosis not present

## 2023-10-29 DIAGNOSIS — A419 Sepsis, unspecified organism: Secondary | ICD-10-CM | POA: Diagnosis not present

## 2023-10-29 DIAGNOSIS — F1020 Alcohol dependence, uncomplicated: Secondary | ICD-10-CM | POA: Diagnosis not present

## 2023-10-29 DIAGNOSIS — Z433 Encounter for attention to colostomy: Secondary | ICD-10-CM | POA: Diagnosis not present

## 2023-10-29 DIAGNOSIS — J439 Emphysema, unspecified: Secondary | ICD-10-CM | POA: Diagnosis not present

## 2023-10-29 DIAGNOSIS — J9611 Chronic respiratory failure with hypoxia: Secondary | ICD-10-CM | POA: Diagnosis not present

## 2023-10-30 ENCOUNTER — Telehealth: Payer: Self-pay

## 2023-10-30 DIAGNOSIS — F1020 Alcohol dependence, uncomplicated: Secondary | ICD-10-CM | POA: Diagnosis not present

## 2023-10-30 DIAGNOSIS — A419 Sepsis, unspecified organism: Secondary | ICD-10-CM | POA: Diagnosis not present

## 2023-10-30 DIAGNOSIS — J4489 Other specified chronic obstructive pulmonary disease: Secondary | ICD-10-CM | POA: Diagnosis not present

## 2023-10-30 DIAGNOSIS — Z433 Encounter for attention to colostomy: Secondary | ICD-10-CM | POA: Diagnosis not present

## 2023-10-30 DIAGNOSIS — J9611 Chronic respiratory failure with hypoxia: Secondary | ICD-10-CM | POA: Diagnosis not present

## 2023-10-30 DIAGNOSIS — R6520 Severe sepsis without septic shock: Secondary | ICD-10-CM | POA: Diagnosis not present

## 2023-10-30 DIAGNOSIS — Z48815 Encounter for surgical aftercare following surgery on the digestive system: Secondary | ICD-10-CM | POA: Diagnosis not present

## 2023-10-30 DIAGNOSIS — J439 Emphysema, unspecified: Secondary | ICD-10-CM | POA: Diagnosis not present

## 2023-10-30 DIAGNOSIS — Z4801 Encounter for change or removal of surgical wound dressing: Secondary | ICD-10-CM | POA: Diagnosis not present

## 2023-10-30 NOTE — Telephone Encounter (Signed)
 Copied from CRM [id number]. Topic: Clinical - Medication Question >> Oct 30, 2023  2:34 PM Tiffany S wrote: Reason for CRM: [facility] request for 17 prescriptions for this patient.

## 2023-11-01 ENCOUNTER — Other Ambulatory Visit: Payer: Self-pay | Admitting: Family Medicine

## 2023-11-01 ENCOUNTER — Telehealth: Payer: Self-pay

## 2023-11-01 DIAGNOSIS — Z433 Encounter for attention to colostomy: Secondary | ICD-10-CM | POA: Diagnosis not present

## 2023-11-01 DIAGNOSIS — Z48815 Encounter for surgical aftercare following surgery on the digestive system: Secondary | ICD-10-CM | POA: Diagnosis not present

## 2023-11-01 DIAGNOSIS — J439 Emphysema, unspecified: Secondary | ICD-10-CM | POA: Diagnosis not present

## 2023-11-01 DIAGNOSIS — A419 Sepsis, unspecified organism: Secondary | ICD-10-CM | POA: Diagnosis not present

## 2023-11-01 DIAGNOSIS — Z4801 Encounter for change or removal of surgical wound dressing: Secondary | ICD-10-CM | POA: Diagnosis not present

## 2023-11-01 DIAGNOSIS — J4489 Other specified chronic obstructive pulmonary disease: Secondary | ICD-10-CM | POA: Diagnosis not present

## 2023-11-01 DIAGNOSIS — R6520 Severe sepsis without septic shock: Secondary | ICD-10-CM | POA: Diagnosis not present

## 2023-11-01 DIAGNOSIS — F1020 Alcohol dependence, uncomplicated: Secondary | ICD-10-CM | POA: Diagnosis not present

## 2023-11-01 DIAGNOSIS — J9611 Chronic respiratory failure with hypoxia: Secondary | ICD-10-CM | POA: Diagnosis not present

## 2023-11-01 NOTE — Telephone Encounter (Signed)
 Copied from CRM [id number]. Topic: Clinical - Prescription Issue >> Nov 01, 2023  9:42 AM Dorthula Gavel H wrote: Reason for CRM: [facility] is checking back in about the 17 prescriptions for the pt that they are requesting. They are starting med packing for the pt. Phone Number is [phone]

## 2023-11-01 NOTE — Telephone Encounter (Unsigned)
 Copied from CRM [id number]. Topic: Clinical - Medication Refill >> Nov 01, 2023 10:22 AM Hamp Levine R wrote: Medication: HYDROcodone -acetaminophen  (NORCO/VICODIN) 5-325 MG tablet  Has the patient contacted their pharmacy? Yes, call dr  This is the patient's preferred pharmacy:  [id number] [place], [place] - [address] [place] [address] Phone: [phone] Fax: [phone]  Is this the correct pharmacy for this prescription? Yes If no, delete pharmacy and type the correct one.   Has the prescription been filled recently? No  Is the patient out of the medication? No, 2 left (advise by home health to request a refill)  Has the patient been seen for an appointment in the last year OR does the patient have an upcoming appointment? Yes  Can we respond through MyChart? Yes  Agent: Please be advised that Rx refills may take up to 3 business days. We ask that you follow-up with your pharmacy.

## 2023-11-04 DIAGNOSIS — F1020 Alcohol dependence, uncomplicated: Secondary | ICD-10-CM | POA: Diagnosis not present

## 2023-11-04 DIAGNOSIS — R6520 Severe sepsis without septic shock: Secondary | ICD-10-CM | POA: Diagnosis not present

## 2023-11-04 DIAGNOSIS — Z433 Encounter for attention to colostomy: Secondary | ICD-10-CM | POA: Diagnosis not present

## 2023-11-04 DIAGNOSIS — A419 Sepsis, unspecified organism: Secondary | ICD-10-CM | POA: Diagnosis not present

## 2023-11-04 DIAGNOSIS — Z4801 Encounter for change or removal of surgical wound dressing: Secondary | ICD-10-CM | POA: Diagnosis not present

## 2023-11-04 DIAGNOSIS — Z48815 Encounter for surgical aftercare following surgery on the digestive system: Secondary | ICD-10-CM | POA: Diagnosis not present

## 2023-11-04 DIAGNOSIS — J4489 Other specified chronic obstructive pulmonary disease: Secondary | ICD-10-CM | POA: Diagnosis not present

## 2023-11-04 DIAGNOSIS — J9611 Chronic respiratory failure with hypoxia: Secondary | ICD-10-CM | POA: Diagnosis not present

## 2023-11-04 DIAGNOSIS — J439 Emphysema, unspecified: Secondary | ICD-10-CM | POA: Diagnosis not present

## 2023-11-04 NOTE — Telephone Encounter (Addendum)
 [facility] is calling to follow up on the below request for medication refills. Previously called 11/01/2023  Medication: citalopram  (CELEXA ) 40 MG tablet [[id number]] ASPIRIN  LOW DOSE 81 MG EC tablet [[id number]] folic acid  (FOLVITE ) 1 MG tablet [[id number]]  albuterol  (VENTOLIN  HFA) 108 (90 Base) MCG/ACT inhaler [[id number]] ALPRAZolam  (XANAX ) 1 MG tablet [[id number]] EPINEPHrine  (EPIPEN  2-PAK) 0.3 mg/0.3 mL IJ SOAJ injection [[id number]] potassium chloride  SA (KLOR-CON  M) 20 MEQ tablet [[id number]] Magnesium  Citrate 100 MG TABS [[id number]] ondansetron  (ZOFRAN ) 4 MG tablet [[id number]] rosuvastatin  (CRESTOR ) 10 MG tablet [[id number]] rizatriptan  (MAXALT ) 10 MG tablet [[id number]] meclizine  (ANTIVERT ) 25 MG tablet [[id number]] rOPINIRole  (REQUIP ) 2 MG tablet [[id number]] thiamine  (VITAMIN B1) 100 MG tablet [[id number]] midodrine  (PROAMATINE ) 5 MG tablet [[id number]]   Has the patient contacted their pharmacy? Yes (Agent: If no, request that the patient contact the pharmacy for the refill. If patient does not wish to contact the pharmacy document the reason why and proceed with request.) (Agent: If yes, when and what did the pharmacy advise?)  This is the patient's preferred pharmacy:   [facility], [place] - [address] [place] [address] Phone: [phone] Fax: [phone]  Is this the correct pharmacy for this prescription? Yes If no, delete pharmacy and type the correct one.   Has the prescription been filled recently? No  Is the patient out of the medication? No  Has the patient been seen for an appointment in the last year OR does the patient have an upcoming appointment? No  Can we respond through MyChart? Yes  Agent: Please be advised that Rx refills may take up to 3 business days. We ask that you follow-up with your pharmacy.

## 2023-11-04 NOTE — Telephone Encounter (Signed)
 Requested medications are due for refill today.  yes  Requested medications are on the active medications list.  yes  Last refill. 10/25/2023 #15 0 rf  Future visit scheduled.   yes  Notes to clinic.  Refill not delegated.    Requested Prescriptions  Pending Prescriptions Disp Refills   HYDROcodone -acetaminophen  (NORCO/VICODIN) 5-325 MG tablet 15 tablet 0    Sig: Take 1 tablet by mouth every 12 (twelve) hours as needed for severe pain (pain score 7-10).     Not Delegated - Analgesics:  Opioid Agonist Combinations Failed - 11/04/2023  3:29 PM      Failed - This refill cannot be delegated      Failed - Urine Drug Screen completed in last 360 days      Failed - Valid encounter within last 3 months    Recent Outpatient Visits           1 week ago Sepsis, due to unspecified organism, unspecified whether acute organ dysfunction present [facility])   [place] [facility] Aletha Bene, MD   6 months ago Hyperlipidemia, unspecified hyperlipidemia type   [place] [facility] Kayla Jeoffrey RAMAN, FNP   6 months ago Depression, unspecified depression type   [place] [facility] Kayla Jeoffrey RAMAN, FNP   1 year ago Polypharmacy   [place] [facility] Kayla Jeoffrey RAMAN, FNP   1 year ago Encounter to establish care with new doctor   [place] [facility] Kayla Jeoffrey RAMAN, FNP

## 2023-11-05 ENCOUNTER — Ambulatory Visit: Payer: Self-pay | Admitting: Family Medicine

## 2023-11-05 DIAGNOSIS — F1020 Alcohol dependence, uncomplicated: Secondary | ICD-10-CM | POA: Diagnosis not present

## 2023-11-05 DIAGNOSIS — J4489 Other specified chronic obstructive pulmonary disease: Secondary | ICD-10-CM | POA: Diagnosis not present

## 2023-11-05 DIAGNOSIS — A419 Sepsis, unspecified organism: Secondary | ICD-10-CM | POA: Diagnosis not present

## 2023-11-05 DIAGNOSIS — Z4801 Encounter for change or removal of surgical wound dressing: Secondary | ICD-10-CM | POA: Diagnosis not present

## 2023-11-05 DIAGNOSIS — Z433 Encounter for attention to colostomy: Secondary | ICD-10-CM | POA: Diagnosis not present

## 2023-11-05 DIAGNOSIS — Z48815 Encounter for surgical aftercare following surgery on the digestive system: Secondary | ICD-10-CM | POA: Diagnosis not present

## 2023-11-05 DIAGNOSIS — R6520 Severe sepsis without septic shock: Secondary | ICD-10-CM | POA: Diagnosis not present

## 2023-11-05 DIAGNOSIS — J9611 Chronic respiratory failure with hypoxia: Secondary | ICD-10-CM | POA: Diagnosis not present

## 2023-11-05 DIAGNOSIS — J439 Emphysema, unspecified: Secondary | ICD-10-CM | POA: Diagnosis not present

## 2023-11-05 NOTE — Telephone Encounter (Unsigned)
 Copied from CRM [id number]. Topic: Clinical - Medication Refill >> Nov 05, 2023 11:26 AM Zebedee SAUNDERS wrote: Medication: HYDROcodone -acetaminophen  (NORCO/VICODIN) 5-325 MG tablet  Has the patient contacted their pharmacy? Yes (Agent: If no, request that the patient contact the pharmacy for the refill. If patient does not wish to contact the pharmacy document the reason why and proceed with request.) (Agent: If yes, when and what did the pharmacy advise?)  This is the patient's preferred pharmacy:  [id number] [place], [place] - [address] [place] [address] Phone: [phone] Fax: [phone]  Is this the correct pharmacy for this prescription? Yes If no, delete pharmacy and type the correct one.   Has the prescription been filled recently? Yes  Is the patient out of the medication? Yes  Has the patient been seen for an appointment in the last year OR does the patient have an upcoming appointment? Yes  Can we respond through MyChart? Yes  Agent: Please be advised that Rx refills may take up to 3 business days. We ask that you follow-up with your pharmacy.

## 2023-11-05 NOTE — Telephone Encounter (Signed)
 FYI Only or Action Required?: Action required by provider: medication refill request.  Patient was last seen in primary care on 10/25/2023 by Aletha Bene, MD. Called Nurse Triage reporting Blood Pressure Check. Symptoms began today. Interventions attempted: Prescription medications: Midodrine . Symptoms are: stable.  Triage Disposition: See Physician Within 24 Hours  Patient/caregiver understands and will follow disposition?: Yes        Copied from CRM [id number]. Topic: Clinical - Red Word Triage >> Nov 05, 2023  4:25 PM Vicki [name] wrote: Red Word that prompted transfer to Nurse Triage: Vicki [name] w/ [facility] physical therapy calling to report low blood pressure. When seated, BP was reading 95/68 and standing it dropped to 78/60 w/ some dizziness reported and heartrate was 115. Reason for Disposition  [1] Systolic BP 90-110 AND [2] taking blood pressure medications AND [3] NOT dizzy, lightheaded or weak  Answer Assessment - Initial Assessment Questions Spoke with Vicki [name], patient's home physical therapist. Patient refuses scheduling an appointment at this time. Patient has a wellness visit on the phone this Thursday. Patient is requesting a refill of midodrine  sent to [facility] on [address]. This RN educated pt on  new-worsening symptoms AND when to call back/seek emergent care.    Fatigue and loss of appetite started today One tablet left of midodrine   History of low blood pressure- usually 100 SBP Denies chest pain  Earlier: BP sitting 96/68 and standing 78/60, HR 115 (5/10 dizziness when standing) *per Vicki [name], pt had not eaten really at this point, after this Vicki [name] put compression stockings on patient*  Now: BP sitting 99/72 BP, HR 93, not currently dizzy,  standing 94/70, HR 126  Protocols used: Blood Pressure - Low-A-AH

## 2023-11-06 ENCOUNTER — Emergency Department (HOSPITAL_COMMUNITY)

## 2023-11-06 ENCOUNTER — Encounter (HOSPITAL_COMMUNITY): Payer: Self-pay

## 2023-11-06 ENCOUNTER — Other Ambulatory Visit: Payer: Self-pay

## 2023-11-06 ENCOUNTER — Emergency Department (HOSPITAL_COMMUNITY)
Admission: EM | Admit: 2023-11-06 | Discharge: 2023-11-06 | Disposition: A | Discharge status: Home / Self Care | Attending: Emergency Medicine | Admitting: Emergency Medicine

## 2023-11-06 DIAGNOSIS — R000 Tachycardia, unspecified: Secondary | ICD-10-CM | POA: Diagnosis not present

## 2023-11-06 DIAGNOSIS — D72829 Elevated white blood cell count, unspecified: Secondary | ICD-10-CM | POA: Diagnosis not present

## 2023-11-06 DIAGNOSIS — J449 Chronic obstructive pulmonary disease, unspecified: Secondary | ICD-10-CM | POA: Diagnosis not present

## 2023-11-06 DIAGNOSIS — R7989 Other specified abnormal findings of blood chemistry: Secondary | ICD-10-CM | POA: Diagnosis not present

## 2023-11-06 DIAGNOSIS — Z48815 Encounter for surgical aftercare following surgery on the digestive system: Secondary | ICD-10-CM | POA: Diagnosis not present

## 2023-11-06 DIAGNOSIS — Z433 Encounter for attention to colostomy: Secondary | ICD-10-CM | POA: Diagnosis not present

## 2023-11-06 DIAGNOSIS — Z7982 Long term (current) use of aspirin: Secondary | ICD-10-CM | POA: Diagnosis not present

## 2023-11-06 DIAGNOSIS — R6520 Severe sepsis without septic shock: Secondary | ICD-10-CM | POA: Diagnosis not present

## 2023-11-06 DIAGNOSIS — J439 Emphysema, unspecified: Secondary | ICD-10-CM | POA: Diagnosis not present

## 2023-11-06 DIAGNOSIS — G43909 Migraine, unspecified, not intractable, without status migrainosus: Secondary | ICD-10-CM | POA: Diagnosis not present

## 2023-11-06 DIAGNOSIS — R519 Headache, unspecified: Secondary | ICD-10-CM | POA: Insufficient documentation

## 2023-11-06 DIAGNOSIS — F1020 Alcohol dependence, uncomplicated: Secondary | ICD-10-CM | POA: Diagnosis not present

## 2023-11-06 DIAGNOSIS — J9611 Chronic respiratory failure with hypoxia: Secondary | ICD-10-CM | POA: Diagnosis not present

## 2023-11-06 DIAGNOSIS — Z4801 Encounter for change or removal of surgical wound dressing: Secondary | ICD-10-CM | POA: Diagnosis not present

## 2023-11-06 DIAGNOSIS — A419 Sepsis, unspecified organism: Secondary | ICD-10-CM | POA: Diagnosis not present

## 2023-11-06 DIAGNOSIS — Z7951 Long term (current) use of inhaled steroids: Secondary | ICD-10-CM | POA: Insufficient documentation

## 2023-11-06 DIAGNOSIS — R110 Nausea: Secondary | ICD-10-CM | POA: Diagnosis not present

## 2023-11-06 DIAGNOSIS — Z79899 Other long term (current) drug therapy: Secondary | ICD-10-CM | POA: Diagnosis not present

## 2023-11-06 DIAGNOSIS — J4489 Other specified chronic obstructive pulmonary disease: Secondary | ICD-10-CM | POA: Diagnosis not present

## 2023-11-06 LAB — CBC WITH DIFFERENTIAL/PLATELET
Abs Immature Granulocytes: 0.06 10*3/uL (ref 0.00–0.07)
Basophils Absolute: 0 10*3/uL (ref 0.0–0.1)
Basophils Relative: 0 %
Eosinophils Absolute: 0.5 10*3/uL (ref 0.0–0.5)
Eosinophils Relative: 4 %
HCT: 30.1 % — ABNORMAL LOW (ref 36.0–46.0)
Hemoglobin: 9.9 g/dL — ABNORMAL LOW (ref 12.0–15.0)
Immature Granulocytes: 1 %
Lymphocytes Relative: 17 %
Lymphs Abs: 2.2 10*3/uL (ref 0.7–4.0)
MCH: 30.7 pg (ref 26.0–34.0)
MCHC: 32.9 g/dL (ref 30.0–36.0)
MCV: 93.5 fL (ref 80.0–100.0)
Monocytes Absolute: 1.8 10*3/uL — ABNORMAL HIGH (ref 0.1–1.0)
Monocytes Relative: 13 %
Neutro Abs: 8.4 10*3/uL — ABNORMAL HIGH (ref 1.7–7.7)
Neutrophils Relative %: 65 %
Platelets: 531 10*3/uL — ABNORMAL HIGH (ref 150–400)
RBC: 3.22 MIL/uL — ABNORMAL LOW (ref 3.87–5.11)
RDW: 14.9 % (ref 11.5–15.5)
WBC: 13 10*3/uL — ABNORMAL HIGH (ref 4.0–10.5)
nRBC: 0 % (ref 0.0–0.2)

## 2023-11-06 LAB — COMPREHENSIVE METABOLIC PANEL WITH GFR
ALT: 13 U/L (ref 0–44)
AST: 102 U/L — ABNORMAL HIGH (ref 15–41)
Albumin: 2.9 g/dL — ABNORMAL LOW (ref 3.5–5.0)
Alkaline Phosphatase: 184 U/L — ABNORMAL HIGH (ref 38–126)
Anion gap: 16 — ABNORMAL HIGH (ref 5–15)
BUN: 7 mg/dL (ref 6–20)
CO2: 21 mmol/L — ABNORMAL LOW (ref 22–32)
Calcium: 8 mg/dL — ABNORMAL LOW (ref 8.9–10.3)
Chloride: 100 mmol/L (ref 98–111)
Creatinine, Ser: 0.64 mg/dL (ref 0.44–1.00)
GFR, Estimated: >60 mL/min (ref >60)
Glucose, Bld: 123 mg/dL — ABNORMAL HIGH (ref 70–99)
Potassium: 4.5 mmol/L (ref 3.5–5.1)
Sodium: 137 mmol/L (ref 135–145)
Total Bilirubin: 0.9 mg/dL (ref 0.0–1.2)
Total Protein: 7.6 g/dL (ref 6.5–8.1)

## 2023-11-06 LAB — URINALYSIS, ROUTINE W REFLEX MICROSCOPIC
Bilirubin Urine: NEGATIVE
Glucose, UA: NEGATIVE mg/dL
Hgb urine dipstick: NEGATIVE
Ketones, ur: 20 mg/dL — AB
Leukocytes,Ua: NEGATIVE
Nitrite: NEGATIVE
Protein, ur: 30 mg/dL — AB
Specific Gravity, Urine: 1.026 (ref 1.005–1.030)
pH: 5 (ref 5.0–8.0)

## 2023-11-06 LAB — ETHANOL: Alcohol, Ethyl (B): <15 mg/dL (ref <15)

## 2023-11-06 MED ORDER — METOCLOPRAMIDE HCL 5 MG/ML IJ SOLN
10.0000 mg | Freq: Once | INTRAMUSCULAR | Status: AC
  Administered 2023-11-06: 10 mg via INTRAVENOUS
  Filled 2023-11-06: qty 2

## 2023-11-06 MED ORDER — SODIUM CHLORIDE 0.9 % IV BOLUS
1000.0000 mL | Freq: Once | INTRAVENOUS | Status: AC
  Administered 2023-11-06: 1000 mL via INTRAVENOUS

## 2023-11-06 MED ORDER — MIDODRINE HCL 5 MG PO TABS
5.0000 mg | ORAL_TABLET | Freq: Once | ORAL | Status: AC
  Administered 2023-11-06: 5 mg via ORAL
  Filled 2023-11-06: qty 1

## 2023-11-06 MED ORDER — KETOROLAC TROMETHAMINE 15 MG/ML IJ SOLN
15.0000 mg | Freq: Once | INTRAMUSCULAR | Status: AC
  Administered 2023-11-06: 15 mg via INTRAVENOUS
  Filled 2023-11-06: qty 1

## 2023-11-06 MED ORDER — MIDODRINE HCL 5 MG PO TABS: 5.0000 mg | ORAL_TABLET | Freq: Two times a day (BID) | ORAL | 0 refills | Status: DC

## 2023-11-06 MED ORDER — HALOPERIDOL LACTATE 5 MG/ML IJ SOLN
5.0000 mg | Freq: Once | INTRAMUSCULAR | Status: AC
  Administered 2023-11-06: 5 mg via INTRAVENOUS
  Filled 2023-11-06: qty 1

## 2023-11-06 MED ORDER — DIPHENHYDRAMINE HCL 50 MG/ML IJ SOLN
25.0000 mg | Freq: Once | INTRAMUSCULAR | Status: AC
  Administered 2023-11-06: 25 mg via INTRAVENOUS
  Filled 2023-11-06: qty 1

## 2023-11-06 NOTE — ED Notes (Signed)
 Patient transported to CT

## 2023-11-06 NOTE — ED Notes (Signed)
 Patient attempted bedpan to provide urine. Unsuccessful.

## 2023-11-06 NOTE — ED Triage Notes (Addendum)
 Pt arrived via EMS, from home. Hx of migraines, has had one for 3 days.   Post op- wound vac at site   18R FA 4mg  zofran  given by EMS

## 2023-11-06 NOTE — Discharge Instructions (Signed)
 Today you were seen for headache.  Please return to the ED if you have uncontrollable vomiting, fever that does not go down with Tylenol  or Motrin , or double vision.  Thank you for letting us  treat you today. After reviewing your labs and imaging, I feel you are safe to go home. Please follow up with your PCP in the next several days and provide them with your records from this visit. Return to the Emergency Room if pain becomes severe or symptoms worsen.

## 2023-11-06 NOTE — ED Provider Notes (Signed)
 [place] EMERGENCY DEPARTMENT AT [facility] Provider Note   CSN: [id number] Arrival date & time: 11/06/23  1230     Patient presents with: Migraine   Vicki [name] is a 58 y.o. female past history significant for COPD, asthma, migraine, sepsis, and anxiety presents today for headache x 3 days.  Patient reports nausea, vomiting, photophobia, tinnitus, and overall fatigue.  Patient denies head injury, fever, chills, chest pain, or shortness of breath.  Patient has been taking oxycodone  and Tylenol  with minimal relief.    Migraine Associated symptoms include headaches.       Prior to Admission medications   Medication Sig Start Date End Date Taking? Authorizing Provider  acetaminophen  (TYLENOL ) 500 MG tablet Take 1 tablet (500 mg total) by mouth every 8 (eight) hours as needed for moderate pain (pain score 4-6) or mild pain (pain score 1-3). 10/19/23   Dennise Lavada POUR, MD  albuterol  (VENTOLIN  HFA) 108 (90 Base) MCG/ACT inhaler INHALE 2 PUFFS INTO THE LUNGS TWICE A DAY Patient taking differently: Inhale 2 puffs into the lungs 2 (two) times daily as needed for shortness of breath. 07/15/23   Belford Pascucci Jeoffrey RAMAN, FNP  ALPRAZolam  (XANAX ) 1 MG tablet Take 1 tablet (1 mg total) by mouth 2 (two) times daily as needed for anxiety. 10/25/23   Aletha Bene, MD  ASPIRIN  LOW DOSE 81 MG EC tablet TAKE 1 TABLET BY MOUTH 2 TIMES DAILY. Patient taking differently: Take 81 mg by mouth in the morning and at bedtime. 03/21/21   Kip Ade, NP  azelastine  (ASTELIN ) 0.1 % nasal spray Place 1 spray into both nostrils 2 (two) times daily. Use in each nostril as directed Patient taking differently: Place 1 spray into both nostrils daily as needed for allergies. 07/07/21   Kip Ade, NP  citalopram  (CELEXA ) 40 MG tablet TAKE 1 TABLET BY MOUTH EVERY DAY 07/31/23   Marquette Piontek Jeoffrey RAMAN, FNP  EPINEPHrine  (EPIPEN  2-PAK) 0.3 mg/0.3 mL IJ SOAJ injection Inject 0.3 mg into the muscle as needed for  anaphylaxis. 12/27/20   Kip Ade, NP  Fluticasone -Umeclidin-Vilant (TRELEGY ELLIPTA ) 200-62.5-25 MCG/ACT AEPB TAKE 1 PUFF BY MOUTH EVERY DAY 02/22/23   Olalere, Jennet A, MD  folic acid  (FOLVITE ) 1 MG tablet Take 1 tablet (1 mg total) by mouth daily. 10/19/23   Singh, Prashant K, MD  HYDROcodone -acetaminophen  (NORCO/VICODIN) 5-325 MG tablet Take 1 tablet by mouth every 12 (twelve) hours as needed for severe pain (pain score 7-10). 10/25/23   Aletha Bene, MD  ipratropium-albuterol  (DUONEB) 0.5-2.5 (3) MG/3ML SOLN Use 1 vial by nebulization in the morning and at bedtime. 10/10/21   Kip Ade, NP  Magnesium  Citrate 100 MG TABS Take 1 tablet by mouth at bedtime. Patient taking differently: Take 100 mg by mouth at bedtime. 10/10/21   Kip Ade, NP  meclizine  (ANTIVERT ) 25 MG tablet Take 1 tablet (25 mg total) by mouth 3 (three) times daily as needed for dizziness. 12/27/20   Kip Ade, NP  melatonin 5 MG TABS Take 1 tablet (5 mg total) by mouth at bedtime as needed (insomnia). 10/19/23   Singh, Prashant K, MD  midodrine  (PROAMATINE ) 5 MG tablet Take 1 tablet (5 mg total) by mouth 2 (two) times daily with a meal. 11/06/23   Kianah Harries Jeoffrey RAMAN, FNP  omeprazole  (PRILOSEC) 40 MG capsule Take 1 capsule (40 mg total) by mouth 2 (two) times daily. Take 30 mins. Prior to breakfast and supper 03/04/23   Beather Delon Gibson, PA  ondansetron  (ZOFRAN ) 4  MG tablet Take 1 tablet (4 mg total) by mouth every 8 (eight) hours as needed for nausea or vomiting. 10/19/23   Singh, Prashant K, MD  ondansetron  (ZOFRAN ) 8 MG tablet Take 1 tablet (8 mg total) by mouth every 8 (eight) hours as needed for nausea or vomiting. 10/25/23   Aletha Bene, MD  potassium chloride  SA (KLOR-CON  M) 20 MEQ tablet Take 1 tablet (20 mEq total) by mouth daily. 10/10/21   Kip Ade, NP  rizatriptan  (MAXALT ) 10 MG tablet TAKE 1 TABLET BY MOUTH AS NEEDED FOR MIGRAINE. MAY REPEAT IN 2 HOURS IF NEEDED STRENGTH: 10 MG 09/26/23    Mikhael Hendriks Jeoffrey RAMAN, FNP  rOPINIRole  (REQUIP ) 2 MG tablet TAKE 1/2 TO 1 TABLETS BY MOUTH AT BEDTIME. 02/25/23   Saleh Ulbrich Jeoffrey RAMAN, FNP  rosuvastatin  (CRESTOR ) 10 MG tablet Take 1 tablet (10 mg total) by mouth daily. 10/19/23   Singh, Prashant K, MD  thiamine  (VITAMIN B1) 100 MG tablet Take 1 tablet (100 mg total) by mouth daily. 10/19/23   Singh, Prashant K, MD  triamcinolone  ointment (KENALOG ) 0.5 % Apply topically to your back 2 (two) times daily. 10/19/23   Singh, Prashant K, MD    Allergies: Jasmine oil, Levaquin  [levofloxacin ], Penicillins, Aleve [naproxen sodium], Chlorhexidine , Depo-provera [medroxyprogesterone acetate], Doxycycline , and Tramadol    Review of Systems  HENT:  Positive for tinnitus.   Eyes:  Positive for photophobia.  Gastrointestinal:  Positive for nausea and vomiting.  Neurological:  Positive for headaches.    Updated Vital Signs BP 101/73   Pulse 78   Temp 98.7 F (37.1 C) (Oral)   Resp 16   Ht 5' 3 (1.6 m)   Wt 90 kg   SpO2 96%   BMI 35.15 kg/m   Physical Exam Vitals and nursing note reviewed.  Constitutional:      General: She is not in acute distress.    Appearance: She is well-developed. She is not diaphoretic.     Comments: Uncomfortable appearing  HENT:     Head: Normocephalic and atraumatic.     Right Ear: External ear normal.     Left Ear: External ear normal.     Nose: Nose normal.     Mouth/Throat:     Mouth: Mucous membranes are moist.     Pharynx: Oropharynx is clear.   Eyes:     Extraocular Movements: Extraocular movements intact.     Conjunctiva/sclera: Conjunctivae normal.     Pupils: Pupils are equal, round, and reactive to light.    Cardiovascular:     Rate and Rhythm: Regular rhythm. Tachycardia present.     Pulses: Normal pulses.     Heart sounds: Normal heart sounds. No murmur heard. Pulmonary:     Effort: Pulmonary effort is normal. No respiratory distress.     Breath sounds: Normal breath sounds.  Abdominal:     Palpations:  Abdomen is soft.     Tenderness: There is no abdominal tenderness.   Musculoskeletal:        General: No swelling.     Cervical back: Neck supple.   Skin:    General: Skin is warm and dry.     Capillary Refill: Capillary refill takes less than 2 seconds.   Neurological:     General: No focal deficit present.     Mental Status: She is alert and oriented to person, place, and time.   Psychiatric:        Mood and Affect: Mood normal.     (  all labs ordered are listed, but only abnormal results are displayed) Labs Reviewed  CBC WITH DIFFERENTIAL/PLATELET - Abnormal; Notable for the following components:      Result Value   WBC 13.0 (*)    RBC 3.22 (*)    Hemoglobin 9.9 (*)    HCT 30.1 (*)    Platelets 531 (*)    Neutro Abs 8.4 (*)    Monocytes Absolute 1.8 (*)    All other components within normal limits  URINALYSIS, ROUTINE W REFLEX MICROSCOPIC - Abnormal; Notable for the following components:   Color, Urine AMBER (*)    APPearance HAZY (*)    Ketones, ur 20 (*)    Protein, ur 30 (*)    Bacteria, UA RARE (*)    All other components within normal limits  COMPREHENSIVE METABOLIC PANEL WITH GFR - Abnormal; Notable for the following components:   CO2 21 (*)    Glucose, Bld 123 (*)    Calcium  8.0 (*)    Albumin  2.9 (*)    AST 102 (*)    Alkaline Phosphatase 184 (*)    Anion gap 16 (*)    All other components within normal limits  ETHANOL    EKG: None  Radiology: CT Head Wo Contrast Result Date: 11/06/2023 CLINICAL DATA:  Headache, increasing frequency or severity. History of migraines. EXAM: CT HEAD WITHOUT CONTRAST TECHNIQUE: Contiguous axial images were obtained from the base of the skull through the vertex without intravenous contrast. RADIATION DOSE REDUCTION: This exam was performed according to the departmental dose-optimization program which includes automated exposure control, adjustment of the mA and/or kV according to patient size and/or use of iterative  reconstruction technique. COMPARISON:  CT head 12/22/2022. FINDINGS: Brain: No acute intracranial hemorrhage. No CT evidence of acute infarct. Similar appearance of prominent perivascular space versus remote lacunar infarct in the inferior aspect of the left lentiform nucleus. No edema, mass effect, or midline shift. The basilar cisterns are patent. Ventricles: The ventricles are normal. Vascular: No hyperdense vessel or unexpected calcification. Skull: No acute or aggressive finding. Chronic deformity of the left lamina papyracea. Orbits: Orbits are symmetric. Sinuses: Mucosal thickening and secretions in the left sphenoid sinus. Other: Mastoid air cells are clear. IMPRESSION: No CT evidence of acute intracranial abnormality. Similar appearance of prominent perivascular space versus remote lacunar infarct in the inferior aspect of the left lentiform nucleus. Electronically Signed   By: Donnice Mania M.D.   On: 11/06/2023 14:31     Procedures   Medications Ordered in the ED  metoCLOPramide  (REGLAN ) injection 10 mg (10 mg Intravenous Given 11/06/23 1325)  diphenhydrAMINE  (BENADRYL ) injection 25 mg (25 mg Intravenous Given 11/06/23 1325)  ketorolac  (TORADOL ) 15 MG/ML injection 15 mg (15 mg Intravenous Given 11/06/23 1613)  haloperidol lactate (HALDOL) injection 5 mg (5 mg Intravenous Given 11/06/23 1613)  midodrine  (PROAMATINE ) tablet 5 mg (5 mg Oral Given 11/06/23 1613)  sodium chloride  0.9 % bolus 1,000 mL (0 mLs Intravenous Stopped 11/06/23 1737)                                    Medical Decision Making Amount and/or Complexity of Data Reviewed Labs: ordered. Radiology: ordered.  Risk Prescription drug management.   This patient presents to the ED for concern of headache differential diagnosis includes headache, migraine, brain bleed, electrolyte abnormality, dehydration   Lab Tests:  I Ordered, and personally interpreted labs.  The  pertinent results include: Leukocytosis at 13, anemia at  9.9 which is chronic for historical values, mildly decreased CO2, anion gap at 16, hypocalcemia at 8, elevated alk phos at 184, decreased albumin  at 2.9, increased AST at 102, alcohol less than 15 UA with 20 ketones, 30 protein, rare bacteria, and 6-10 WBCs   Imaging Studies ordered:  I ordered imaging studies including CT head Noncon I independently visualized and interpreted imaging which showed No CT evidence of intracranial abnormality. I agree with the radiologist interpretation   Medicines ordered and prescription drug management:  I ordered medication including Toradol , Tylenol , midodrine , Haldol, IVF Reglan  and Benadryl     I have reviewed the patients home medicines and have made adjustments as needed   Problem List / ED Course:  Upon reevaluation patient states she is feeling much better and able to tolerate p.o. intake at this time. Considered for admission or further workup however patient's vital signs, physical exam, labs, and imaging of been reassuring.  Patient given return precautions.  I feel patient safe for discharge at this time.       Final diagnoses:  Acute nonintractable headache, unspecified headache type    ED Discharge Orders     None          Francis Ileana SAILOR, PA-C 11/06/23 1805    Bari Roxie HERO, DO 11/07/23 [phone]

## 2023-11-06 NOTE — ED Notes (Signed)
 Patient attempted urine sample again. Unsuccessful.

## 2023-11-06 NOTE — ED Notes (Signed)
 Patient given water for PO challenge.

## 2023-11-07 ENCOUNTER — Telehealth: Payer: Self-pay

## 2023-11-07 ENCOUNTER — Ambulatory Visit: Payer: Medicare HMO | Admitting: *Deleted

## 2023-11-07 DIAGNOSIS — A419 Sepsis, unspecified organism: Secondary | ICD-10-CM | POA: Diagnosis not present

## 2023-11-07 DIAGNOSIS — Z433 Encounter for attention to colostomy: Secondary | ICD-10-CM | POA: Diagnosis not present

## 2023-11-07 DIAGNOSIS — F1020 Alcohol dependence, uncomplicated: Secondary | ICD-10-CM | POA: Diagnosis not present

## 2023-11-07 DIAGNOSIS — J9611 Chronic respiratory failure with hypoxia: Secondary | ICD-10-CM | POA: Diagnosis not present

## 2023-11-07 DIAGNOSIS — J4489 Other specified chronic obstructive pulmonary disease: Secondary | ICD-10-CM | POA: Diagnosis not present

## 2023-11-07 DIAGNOSIS — Z4801 Encounter for change or removal of surgical wound dressing: Secondary | ICD-10-CM | POA: Diagnosis not present

## 2023-11-07 DIAGNOSIS — J439 Emphysema, unspecified: Secondary | ICD-10-CM | POA: Diagnosis not present

## 2023-11-07 DIAGNOSIS — R6520 Severe sepsis without septic shock: Secondary | ICD-10-CM | POA: Diagnosis not present

## 2023-11-07 DIAGNOSIS — Z0000 Encounter for general adult medical examination without abnormal findings: Secondary | ICD-10-CM

## 2023-11-07 DIAGNOSIS — Z48815 Encounter for surgical aftercare following surgery on the digestive system: Secondary | ICD-10-CM | POA: Diagnosis not present

## 2023-11-07 NOTE — Progress Notes (Unsigned)
 Subjective:   Vicki [name] is a 58 y.o. female who presents for Medicare Annual (Subsequent) preventive examination.  Visit Complete: Virtual I connected with  Venus Johnny Pouch on 11/07/23 by a audio enabled telemedicine application and verified that I am speaking with the correct person using two identifiers.  Patient Location: Home  Provider Location: Home Office  I discussed the limitations of evaluation and management by telemedicine. The patient expressed understanding and agreed to proceed.  Vital Signs: Because this visit was a virtual/telehealth visit, some criteria may be missing or patient reported. Any vitals not documented were not able to be obtained and vitals that have been documented are patient reported.  Cardiac Risk Factors include: advanced age (>66men, >56 women);obesity (BMI >30kg/m2);hypertension;family history of premature cardiovascular disease     Objective:    Today's Vitals   11/07/23 1027  PainSc: 3    There is no height or weight on file to calculate BMI.     11/07/2023   10:30 AM 11/06/2023    2:48 PM 12/22/2022    6:32 PM 11/01/2022   10:55 AM 04/12/2022    5:35 PM 04/26/2021    8:10 PM 10/18/2020    9:20 AM  Advanced Directives  Does Patient Have a Medical Advance Directive? Yes No No No Yes Yes No  Type of Psychiatrist of [place];Living will Living will   Does patient want to make changes to medical advance directive?     No - Patient declined No - Patient declined   Copy of Healthcare Power of Attorney in Chart? Yes - validated most recent copy scanned in chart (See row information)    No - copy requested    Would patient like information on creating a medical advance directive?  No - Patient declined  Yes (MAU/Ambulatory/Procedural Areas - Information given)  No - Guardian declined No - Patient declined    Current Medications (verified) Outpatient Encounter Medications as  of 11/07/2023  Medication Sig   acetaminophen  (TYLENOL ) 500 MG tablet Take 1 tablet (500 mg total) by mouth every 8 (eight) hours as needed for moderate pain (pain score 4-6) or mild pain (pain score 1-3).   albuterol  (VENTOLIN  HFA) 108 (90 Base) MCG/ACT inhaler INHALE 2 PUFFS INTO THE LUNGS TWICE A DAY   ALPRAZolam  (XANAX ) 1 MG tablet Take 1 tablet (1 mg total) by mouth 2 (two) times daily as needed for anxiety.   ASPIRIN  LOW DOSE 81 MG EC tablet TAKE 1 TABLET BY MOUTH 2 TIMES DAILY.   azelastine  (ASTELIN ) 0.1 % nasal spray Place 1 spray into both nostrils 2 (two) times daily. Use in each nostril as directed (Patient taking differently: Place 1 spray into both nostrils daily as needed for allergies.)   citalopram  (CELEXA ) 40 MG tablet TAKE 1 TABLET BY MOUTH EVERY DAY   EPINEPHrine  (EPIPEN  2-PAK) 0.3 mg/0.3 mL IJ SOAJ injection Inject 0.3 mg into the muscle as needed for anaphylaxis.   Fluticasone -Umeclidin-Vilant (TRELEGY ELLIPTA ) 200-62.5-25 MCG/ACT AEPB TAKE 1 PUFF BY MOUTH EVERY DAY   folic acid  (FOLVITE ) 1 MG tablet Take 1 tablet (1 mg total) by mouth daily.   HYDROcodone -acetaminophen  (NORCO/VICODIN) 5-325 MG tablet Take 1 tablet by mouth every 12 (twelve) hours as needed for severe pain (pain score 7-10).   ipratropium-albuterol  (DUONEB) 0.5-2.5 (3) MG/3ML SOLN Use 1 vial by nebulization in the morning and at bedtime.   Magnesium  Citrate 100 MG TABS Take 1 tablet by  mouth at bedtime. (Patient taking differently: Take 100 mg by mouth at bedtime.)   meclizine  (ANTIVERT ) 25 MG tablet Take 1 tablet (25 mg total) by mouth 3 (three) times daily as needed for dizziness.   melatonin 5 MG TABS Take 1 tablet (5 mg total) by mouth at bedtime as needed (insomnia).   midodrine  (PROAMATINE ) 5 MG tablet Take 1 tablet (5 mg total) by mouth 2 (two) times daily with a meal.   omeprazole  (PRILOSEC) 40 MG capsule Take 1 capsule (40 mg total) by mouth 2 (two) times daily. Take 30 mins. Prior to breakfast and  supper   ondansetron  (ZOFRAN ) 4 MG tablet Take 1 tablet (4 mg total) by mouth every 8 (eight) hours as needed for nausea or vomiting.   ondansetron  (ZOFRAN ) 8 MG tablet Take 1 tablet (8 mg total) by mouth every 8 (eight) hours as needed for nausea or vomiting.   potassium chloride  SA (KLOR-CON  M) 20 MEQ tablet Take 1 tablet (20 mEq total) by mouth daily.   rizatriptan  (MAXALT ) 10 MG tablet TAKE 1 TABLET BY MOUTH AS NEEDED FOR MIGRAINE. MAY REPEAT IN 2 HOURS IF NEEDED STRENGTH: 10 MG   rOPINIRole  (REQUIP ) 2 MG tablet TAKE 1/2 TO 1 TABLETS BY MOUTH AT BEDTIME.   rosuvastatin  (CRESTOR ) 10 MG tablet Take 1 tablet (10 mg total) by mouth daily.   thiamine  (VITAMIN B1) 100 MG tablet Take 1 tablet (100 mg total) by mouth daily.   triamcinolone  ointment (KENALOG ) 0.5 % Apply topically to your back 2 (two) times daily.   No facility-administered encounter medications on file as of 11/07/2023.    Allergies (verified) Jasmine oil, Levaquin  [levofloxacin ], Penicillins, Aleve [naproxen sodium], Chlorhexidine , Depo-provera [medroxyprogesterone acetate], Doxycycline , and Tramadol   History: Past Medical History:  Diagnosis Date   Allergy    seasonal allergies   Anxiety    on meds   Arthritis    hands   Asthma    childhood-current   CAP (community acquired pneumonia) 04/07/2019   Carpal tunnel syndrome    had bilateral sx to tx   Cataract    bilateral--no sx yet   COPD (chronic obstructive pulmonary disease) (HCC)    Depression    on meds   Diverticulitis    Emphysema of lung (HCC)    uses MDI   Essential hypertension, benign 07/14/2019   on meds   Hyperlipidemia    on meds   Oxygen deficiency    on 5 L cont [place]   Oxygen dependent    on 5 L cont [place]   Past Surgical History:  Procedure Laterality Date   ABDOMINAL HYSTERECTOMY  2009   APPENDECTOMY     ARTHROSCOPIC REPAIR ACL Left 2011/2014   x 2   BIOPSY  01/21/2020   Procedure: BIOPSY;  Surgeon: Shila Gustav GAILS, MD;  Location: [facility];  Service: Endoscopy;;  EGD and COLON   BUNIONECTOMY  2006   both   CARPAL TUNNEL RELEASE Bilateral 10/28/2013   Procedure: BILATERAL CARPAL TUNNEL RELEASE;  Surgeon: Arley JONELLE Curia, MD;  Location: [facility];  Service: Orthopedics;  Laterality: Bilateral;   CHOLECYSTECTOMY  1994   CHONDROPLASTY Right 08/31/2020   Procedure: CHONDROPLASTY;  Surgeon: Shari Sieving, MD;  Location: [facility];  Service: Orthopedics;  Laterality: Right;   COLONOSCOPY WITH PROPOFOL  N/A 01/21/2020   Procedure: COLONOSCOPY WITH PROPOFOL ;  Surgeon: Shila Gustav GAILS, MD;  Location: [facility];  Service: Endoscopy;  Laterality: N/A;   DILATION AND  CURETTAGE OF UTERUS     ESOPHAGOGASTRODUODENOSCOPY (EGD) WITH PROPOFOL  N/A 01/21/2020   Procedure: ESOPHAGOGASTRODUODENOSCOPY (EGD) WITH PROPOFOL ;  Surgeon: Shila Gustav GAILS, MD;  Location: [facility];  Service: Endoscopy;  Laterality: N/A;   KNEE ARTHROSCOPY WITH ANTERIOR CRUCIATE LIGAMENT (ACL) REPAIR Right 08/31/2020   Procedure: RIGHT KNEE ARTHROSCOPY WITH ANTERIOR CRUCIATE LIGAMENT (ACL) REPAIR MEDIAL AND LATERAL  MENISECTOMY;  Surgeon: Shari Sieving, MD;  Location: [facility];  Service: Orthopedics;  Laterality: Right;  FEMORAL NERVE BLOCK   LAPAROTOMY N/A 10/10/2023   Procedure: EXPLORATORY LAPAROTOMY, PARTIAL COLECTOMY AND COLOSTOMY;  Surgeon: Teresa Lonni HERO, MD;  Location: [facility];  Service: General;  Laterality: N/A;  EXPLORATORY LAPAROTOMY , PARTIAL COLECTOMY AND COLOSTOMY   POLYPECTOMY  01/21/2020   Procedure: POLYPECTOMY;  Surgeon: Shila Gustav GAILS, MD;  Location: [facility];  Service: Endoscopy;;   TUBAL LIGATION  2004   Family History  Problem Relation Age of Onset   Brain cancer Mother 38   Lung cancer Mother 52   Breast cancer Sister 25   Ovarian cancer Sister 52   Colon cancer Neg Hx    Colon polyps Neg Hx    Esophageal cancer Neg Hx    Rectal cancer Neg Hx    Stomach cancer Neg Hx     Social History   Socioeconomic History   Marital status: Married    Spouse name: Lemond   Number of children: 3   Years of education: 12th grade   Highest education level: Not on file  Occupational History   Occupation: Multimedia programmer: [employer]  Tobacco Use   Smoking status: Former    Current packs/day: 1.00    Average packs/day: 1 pack/day for 34.0 years (34.0 ttl pk-yrs)    Types: Cigarettes   Smokeless tobacco: Never   Tobacco comments:    6 cigarettes a day   Vaping Use   Vaping status: Never Used  Substance and Sexual Activity   Alcohol use: Yes    Comment: everyday   Drug use: No   Sexual activity: Yes    Birth control/protection: Surgical  Other Topics Concern   Not on file  Social History Narrative   Lives with her husband.   Their 3 children live independently.   Social Drivers of Corporate investment banker Strain: Low Risk  (11/07/2023)   Overall Financial Resource Strain (CARDIA)    Difficulty of Paying Living Expenses: Not hard at all  Food Insecurity: No Food Insecurity (11/07/2023)   Hunger Vital Sign    Worried About Running Out of Food in the Last Year: Never true    Ran Out of Food in the Last Year: Never true  Transportation Needs: No Transportation Needs (11/07/2023)   PRAPARE - Administrator, Civil Service (Medical): No    Lack of Transportation (Non-Medical): No  Physical Activity: Inactive (11/07/2023)   Exercise Vital Sign    Days of Exercise per Week: 0 days    Minutes of Exercise per Session: 0 min  Stress: Stress Concern Present (11/07/2023)   [employer] of Occupational Health - Occupational Stress Questionnaire    Feeling of Stress: To some extent  Social Connections: Moderately Integrated (11/07/2023)   Social Connection and Isolation Panel    Frequency of Communication with Friends and Family: More than three times a week    Frequency of Social Gatherings with Friends and Family: Twice a week     Attends Religious Services: More  than 4 times per year    Active Member of Clubs or Organizations: No    Attends Banker Meetings: Never    Marital Status: Married    Tobacco Counseling Counseling given: Not Answered Tobacco comments: 6 cigarettes a day    Clinical Intake:  Pre-visit preparation completed: Yes  Pain : 0-10 Pain Score: 3  Pain Location: Head Pain Descriptors / Indicators: Dull, Aching Pain Onset: In the past 7 days Pain Frequency: Intermittent     Diabetes: No  How often do you need to have someone help you when you read instructions, pamphlets, or other written materials from your doctor or pharmacy?: 1 - Never  Interpreter Needed?: No  Information entered by :: Mliss Graff LPN   Activities of Daily Living    11/07/2023   10:36 AM 10/10/2023   10:57 AM  In your present state of health, do you have any difficulty performing the following activities:  Hearing? 0   Vision? 0   Difficulty concentrating or making decisions? 0   Walking or climbing stairs? 1   Dressing or bathing? 1   Doing errands, shopping? 1 0  Preparing Food and eating ? Y   Using the Toilet? N   In the past six months, have you accidently leaked urine? N   Do you have problems with loss of bowel control? N   Managing your Medications? Y   Managing your Finances? Y   Housekeeping or managing your Housekeeping? Y     Patient Care Team: Kayla Jeoffrey RAMAN, FNP as PCP - General (Family Medicine) Neda Jennet LABOR, MD as Consulting Physician (Pulmonary Disease[facility] any recent Medical Services you may have received from other than [facility] providers in the past year (date may be approximate).     Assessment:   This is a routine wellness examination for [place].  Hearing/Vision screen Hearing Screening - Comments:: Up to date Vision Screening - Comments:: Not up to date Education provided   Goals Addressed             This  Visit's Progress    Patient Stated       Feel better so can play with grandkids       Depression Screen    11/07/2023   10:34 AM 04/25/2023    9:19 AM 11/01/2022   10:52 AM 10/29/2022    2:49 PM 04/18/2022    3:49 PM 07/10/2021   12:44 PM 05/09/2021    1:18 PM  PHQ 2/9 Scores  PHQ - 2 Score 3 5 2 2  0 5 0  PHQ- 9 Score 12 13 7  4 17 5     Fall Risk    11/07/2023   10:28 AM 11/01/2022   10:54 AM 10/29/2022    2:49 PM 04/18/2022    3:49 PM 10/10/2021   11:18 AM  Fall Risk   Falls in the past year? 1 0 0 0 1  Number falls in past yr: 0 0 0 0 0  Injury with Fall? 1 0 0 0 1  Risk for fall due to : Impaired balance/gait;Impaired mobility No Fall Risks No Fall Risks No Fall Risks History of fall(s)  Follow up Falls evaluation completed;Education provided;Falls prevention discussed Falls prevention discussed;Education provided;Falls evaluation completed   Falls evaluation completed      Data saved with a previous flowsheet row definition    MEDICARE RISK AT HOME: Medicare Risk at Home Any stairs in or around the  home?: Yes If so, are there any without handrails?: Yes Home free of loose throw rugs in walkways, pet beds, electrical cords, etc?: Yes Adequate lighting in your home to reduce risk of falls?: Yes Life alert?: No Use of a cane, walker or w/c?: Yes Grab bars in the bathroom?: No Shower chair or bench in shower?: No Elevated toilet seat or a handicapped toilet?: No  TIMED UP AND GO:  Was the test performed?  No    Cognitive Function:        11/07/2023   10:32 AM 11/01/2022   10:55 AM  6CIT Screen  What Year? 0 points 0 points  What month? 0 points 0 points  What time? 0 points 0 points  Count back from 20 0 points 0 points  Months in reverse 0 points 0 points  Repeat phrase 0 points 0 points  Total Score 0 points 0 points    Immunizations Immunization History  Administered Date(s) Administered   Influenza Inj Mdck Quad Pf 02/25/2017, 01/09/2018    Influenza,inj,Quad PF,6+ Mos 02/02/2015, 01/28/2019, 04/16/2022   Influenza-Unspecified 02/18/2017, 01/09/2018, 03/23/2020, 04/14/2021, 02/15/2023   Moderna Covid-19 Fall Seasonal Vaccine 53yrs & older 02/15/2023   PFIZER(Purple Top)SARS-COV-2 Vaccination 08/06/2019, 08/29/2019, 03/22/2020   Pneumococcal Polysaccharide-23 03/12/2019, 04/16/2022   Tdap 05/14/2010, 06/13/2020   Zoster Recombinant(Shingrix) 03/12/2019    TDAP status: Up to date  Flu Vaccine status: Up to date    Covid-19 vaccine status: Information provided on how to obtain vaccines.   Qualifies for Shingles Vaccine? Yes   Zostavax completed No   Shingrix Completed?: No.    Education has been provided regarding the importance of this vaccine. Patient has been advised to call insurance company to determine out of pocket expense if they have not yet received this vaccine. Advised may also receive vaccine at local pharmacy or Health Dept. Verbalized acceptance and understanding.  Screening Tests Health Maintenance  Topic Date Due   Hepatitis B Vaccines (1 of 3 - 19+ 3-dose series) Never done   Cervical Cancer Screening (HPV/Pap Cotest)  Never done   Pneumococcal Vaccine 26-67 Years old (2 of 2 - PCV) 04/17/2023   Lung Cancer Screening  07/12/2023   COVID-19 Vaccine (5 - Pfizer risk 2024-25 season) 08/16/2023   INFLUENZA VACCINE  12/13/2023   Medicare Annual Wellness (AWV)  11/06/2024   MAMMOGRAM  12/03/2024   DTaP/Tdap/Td (3 - Td or Tdap) 06/13/2030   Hepatitis C Screening  Completed   HIV Screening  Completed   HPV VACCINES  Aged Out   Meningococcal B Vaccine  Aged Out   Colonoscopy  Discontinued   Fecal DNA (Cologuard)  Discontinued   Zoster Vaccines- Shingrix  Discontinued    Health Maintenance  Health Maintenance Due  Topic Date Due   Hepatitis B Vaccines (1 of 3 - 19+ 3-dose series) Never done   Cervical Cancer Screening (HPV/Pap Cotest)  Never done   Pneumococcal Vaccine 54-18 Years old (2 of 2 - PCV)  04/17/2023   Lung Cancer Screening  07/12/2023   COVID-19 Vaccine (5 - Pfizer risk 2024-25 season) 08/16/2023    Colonoscopy  has a colostomy bag   Mammogram status: Completed  . Repeat every year   Lung Cancer Screening: (Low Dose CT Chest recommended if Age 21-80 years, 20 pack-year currently smoking OR have quit w/in 15years.) does qualify.   Lung Cancer Screening Referral: ordered  Additional Screening:  Hepatitis C Screening: does not qualify; Completed 2020  Vision Screening: Recommended annual ophthalmology exams  for early detection of glaucoma and other disorders of the eye. Is the patient up to date with their annual eye exam?  No  Who is the provider or what is the name of the office in which the patient attends annual eye exams? Education provided If pt is not established with a provider, would they like to be referred to a provider to establish care? No .   Dental Screening: Recommended annual dental exams for proper oral hygiene    Community Resource Referral / Chronic Care Management: CRR required this visit?  No   CCM required this visit?  No     Plan:     I have personally reviewed and noted the following in the patient's chart:   Medical and social history Use of alcohol, tobacco or illicit drugs  Current medications and supplements including opioid prescriptions. Patient is not currently taking opioid prescriptions. Functional ability and status Nutritional status Physical activity Advanced directives List of other physicians Hospitalizations, surgeries, and ER visits in previous 12 months Vitals Screenings to include cognitive, depression, and falls Referrals and appointments  In addition, I have reviewed and discussed with patient certain preventive protocols, quality metrics, and best practice recommendations. A written personalized care plan for preventive services as well as general preventive health recommendations were provided to  patient.     Mliss Graff, LPN   [id number]   After Visit Summary: (MyChart) Due to this being a telephonic visit, the after visit summary with patients personalized plan was offered to patient via MyChart   Nurse Notes:

## 2023-11-07 NOTE — Telephone Encounter (Signed)
 Transition Care Management Follow-up Telephone Call Date of discharge and from where: 11-06-2023 How have you been since you were released from the hospital? Vicki [name] Any questions or concerns? No  Items Reviewed: Did the pt receive and understand the discharge instructions provided? Yes  Medications obtained and verified? Yes  Other? No  Any new allergies since your discharge? No  Dietary orders reviewed? No Do you have support at home? Yes   Home Care and Equipment/Supplies: Were home health services ordered? no If so, what is the name of the agency?   Has the agency set up a time to come to the patient's home? not applicable Were any new equipment or medical supplies ordered?  No What is the name of the medical supply agency?  Were you able to get the supplies/equipment? not applicable Do you have any questions related to the use of the equipment or supplies? No  Functional Questionnaire: (I = Independent and D = Dependent) ADLs: INDEPENDENT WITH ASSIST   Bathing/Dressing- INDEPENDENT WITH ASSIST  Meal Prep- D  Eating- I  Maintaining continence- COLOSTOMY BAG  Transferring/Ambulation- D  Managing Meds- INDEPENDENT WITH ASSIST  Follow up appointments reviewed:  PCP Hospital f/u appt confirmed? Did not schedule will call  . Specialist Hospital f/u appt confirmed? No  . Are transportation arrangements needed? No  If their condition worsens, is the pt aware to call PCP or go to the Emergency Dept.? Yes Was the patient provided with contact information for the PCP's office or ED? Yes Was to pt encouraged to call back with questions or concerns? Yes

## 2023-11-07 NOTE — Telephone Encounter (Signed)
 Copied from CRM [id number]. Topic: Clinical - Medication Question >> Nov 07, 2023  1:09 PM Tiffany S wrote: Reason for CRM: midodrine  (PROAMATINE ) 5 MG tablet [[id number]] Patient is asking if she still needs to take this medication please follow up with patient   [phone] [facility]

## 2023-11-07 NOTE — Telephone Encounter (Signed)
 Copied from CRM [id number]. Topic: Clinical - Prescription Issue >> Nov 07, 2023  8:33 AM Vena H wrote: Reason for CRM: [facility] called to have these medications switched over to them: ASPIRIN  LOW DOSE 81 MG EC tablet albuterol  (VENTOLIN  HFA) 108 (90 Base) MCG/ACT inhaler ALPRAZolam  (XANAX ) 1 MG tablet Magnesium  Citrate 100 MG TABS meclizine  (ANTIVERT ) 25 MG tablet midodrine  (PROAMATINE ) 5 MG tablet

## 2023-11-07 NOTE — Patient Instructions (Signed)
 Ms. Vicki [name] , Thank you for taking time to come for your Medicare Wellness Visit. I appreciate your ongoing commitment to your health goals. Please review the following plan we discussed and let me know if I can assist you in the future.   Screening recommendations/referrals:  Mammogram: Education provided  Recommended yearly ophthalmology/optometry visit for glaucoma screening and checkup Recommended yearly dental visit for hygiene and checkup  Vaccinations: Influenza vaccine: up to date Tdap vaccine: up to date       Preventive Care 65 Years and Older, Female Preventive care refers to lifestyle choices and visits with your health care provider that can promote health and wellness. What does preventive care include? A yearly physical exam. This is also called an annual well check. Dental exams once or twice a year. Routine eye exams. Ask your health care provider how often you should have your eyes checked. Personal lifestyle choices, including: Daily care of your teeth and gums. Regular physical activity. Eating a healthy diet. Avoiding tobacco and drug use. Limiting alcohol use. Practicing safe sex. Taking low-dose aspirin  every day. Taking vitamin and mineral supplements as recommended by your health care provider. What happens during an annual well check? The services and screenings done by your health care provider during your annual well check will depend on your age, overall health, lifestyle risk factors, and family history of disease. Counseling  Your health care provider may ask you questions about your: Alcohol use. Tobacco use. Drug use. Emotional well-being. Home and relationship well-being. Sexual activity. Eating habits. History of falls. Memory and ability to understand (cognition). Work and work Astronomer. Reproductive health. Screening  You may have the following tests or measurements: Height, weight, and BMI. Blood pressure. Lipid and  cholesterol levels. These may be checked every 5 years, or more frequently if you are over 9 years old. Skin check. Lung cancer screening. You may have this screening every year starting at age 4 if you have a 30-pack-year history of smoking and currently smoke or have quit within the past 15 years. Fecal occult blood test (FOBT) of the stool. You may have this test every year starting at age 15. Flexible sigmoidoscopy or colonoscopy. You may have a sigmoidoscopy every 5 years or a colonoscopy every 10 years starting at age 51. Hepatitis C blood test. Hepatitis B blood test. Sexually transmitted disease (STD) testing. Diabetes screening. This is done by checking your blood sugar (glucose) after you have not eaten for a while (fasting). You may have this done every 1-3 years. Bone density scan. This is done to screen for osteoporosis. You may have this done starting at age 73. Mammogram. This may be done every 1-2 years. Talk to your health care provider about how often you should have regular mammograms. Talk with your health care provider about your test results, treatment options, and if necessary, the need for more tests. Vaccines  Your health care provider may recommend certain vaccines, such as: Influenza vaccine. This is recommended every year. Tetanus, diphtheria, and acellular pertussis (Tdap, Td) vaccine. You may need a Td booster every 10 years. Zoster vaccine. You may need this after age 12. Pneumococcal 13-valent conjugate (PCV13) vaccine. One dose is recommended after age 110. Pneumococcal polysaccharide (PPSV23) vaccine. One dose is recommended after age 43. Talk to your health care provider about which screenings and vaccines you need and how often you need them. This information is not intended to replace advice given to you by your health care provider. Make sure  you discuss any questions you have with your health care provider. Document Released: 05/27/2015 Document Revised:  01/18/2016 Document Reviewed: 03/01/2015 Elsevier Interactive Patient Education  2017 [employer].  Fall Prevention in the Home Falls can cause injuries. They can happen to people of all ages. There are many things you can do to make your home safe and to help prevent falls. What can I do on the outside of my home? Regularly fix the edges of walkways and driveways and fix any cracks. Remove anything that might make you trip as you walk through a door, such as a raised step or threshold. Trim any bushes or trees on the path to your home. Use bright outdoor lighting. Clear any walking paths of anything that might make someone trip, such as rocks or tools. Regularly check to see if handrails are loose or broken. Make sure that both sides of any steps have handrails. Any raised decks and porches should have guardrails on the edges. Have any leaves, snow, or ice cleared regularly. Use sand or salt on walking paths during winter. Clean up any spills in your garage right away. This includes oil or grease spills. What can I do in the bathroom? Use night lights. Install grab bars by the toilet and in the tub and shower. Do not use towel bars as grab bars. Use non-skid mats or decals in the tub or shower. If you need to sit down in the shower, use a plastic, non-slip stool. Keep the floor dry. Clean up any water that spills on the floor as soon as it happens. Remove soap buildup in the tub or shower regularly. Attach bath mats securely with double-sided non-slip rug tape. Do not have throw rugs and other things on the floor that can make you trip. What can I do in the bedroom? Use night lights. Make sure that you have a light by your bed that is easy to reach. Do not use any sheets or blankets that are too big for your bed. They should not hang down onto the floor. Have a firm chair that has side arms. You can use this for support while you get dressed. Do not have throw rugs and other things  on the floor that can make you trip. What can I do in the kitchen? Clean up any spills right away. Avoid walking on wet floors. Keep items that you use a lot in easy-to-reach places. If you need to reach something above you, use a strong step stool that has a grab bar. Keep electrical cords out of the way. Do not use floor polish or wax that makes floors slippery. If you must use wax, use non-skid floor wax. Do not have throw rugs and other things on the floor that can make you trip. What can I do with my stairs? Do not leave any items on the stairs. Make sure that there are handrails on both sides of the stairs and use them. Fix handrails that are broken or loose. Make sure that handrails are as long as the stairways. Check any carpeting to make sure that it is firmly attached to the stairs. Fix any carpet that is loose or worn. Avoid having throw rugs at the top or bottom of the stairs. If you do have throw rugs, attach them to the floor with carpet tape. Make sure that you have a light switch at the top of the stairs and the bottom of the stairs. If you do not have them, ask someone  to add them for you. What else can I do to help prevent falls? Wear shoes that: Do not have high heels. Have rubber bottoms. Are comfortable and fit you well. Are closed at the toe. Do not wear sandals. If you use a stepladder: Make sure that it is fully opened. Do not climb a closed stepladder. Make sure that both sides of the stepladder are locked into place. Ask someone to hold it for you, if possible. Clearly mark and make sure that you can see: Any grab bars or handrails. First and last steps. Where the edge of each step is. Use tools that help you move around (mobility aids) if they are needed. These include: Canes. Walkers. Scooters. Crutches. Turn on the lights when you go into a dark area. Replace any light bulbs as soon as they burn out. Set up your furniture so you have a clear path. Avoid  moving your furniture around. If any of your floors are uneven, fix them. If there are any pets around you, be aware of where they are. Review your medicines with your doctor. Some medicines can make you feel dizzy. This can increase your chance of falling. Ask your doctor what other things that you can do to help prevent falls. This information is not intended to replace advice given to you by your health care provider. Make sure you discuss any questions you have with your health care provider. Document Released: 02/24/2009 Document Revised: 10/06/2015 Document Reviewed: 06/04/2014 Elsevier Interactive Patient Education  2017 [employer].

## 2023-11-08 DIAGNOSIS — F1020 Alcohol dependence, uncomplicated: Secondary | ICD-10-CM | POA: Diagnosis not present

## 2023-11-08 DIAGNOSIS — Z4801 Encounter for change or removal of surgical wound dressing: Secondary | ICD-10-CM | POA: Diagnosis not present

## 2023-11-08 DIAGNOSIS — A419 Sepsis, unspecified organism: Secondary | ICD-10-CM | POA: Diagnosis not present

## 2023-11-08 DIAGNOSIS — Z433 Encounter for attention to colostomy: Secondary | ICD-10-CM | POA: Diagnosis not present

## 2023-11-08 DIAGNOSIS — J439 Emphysema, unspecified: Secondary | ICD-10-CM | POA: Diagnosis not present

## 2023-11-08 DIAGNOSIS — J4489 Other specified chronic obstructive pulmonary disease: Secondary | ICD-10-CM | POA: Diagnosis not present

## 2023-11-08 DIAGNOSIS — J9611 Chronic respiratory failure with hypoxia: Secondary | ICD-10-CM | POA: Diagnosis not present

## 2023-11-08 DIAGNOSIS — R6520 Severe sepsis without septic shock: Secondary | ICD-10-CM | POA: Diagnosis not present

## 2023-11-08 DIAGNOSIS — Z48815 Encounter for surgical aftercare following surgery on the digestive system: Secondary | ICD-10-CM | POA: Diagnosis not present

## 2023-11-08 NOTE — Telephone Encounter (Signed)
 The spouse of the patient is calling back [place] as she said to call back when Grae is awake. I teams [place] and then called and spoke with Nanette who got Colleen. I transferred the call

## 2023-11-08 NOTE — Telephone Encounter (Signed)
 Spoke with pt husband, he said that she was asleep and would call me when she wakes up.

## 2023-11-11 ENCOUNTER — Ambulatory Visit: Payer: Self-pay | Admitting: *Deleted

## 2023-11-11 ENCOUNTER — Telehealth: Payer: Self-pay | Admitting: Family Medicine

## 2023-11-11 DIAGNOSIS — Z433 Encounter for attention to colostomy: Secondary | ICD-10-CM | POA: Diagnosis not present

## 2023-11-11 DIAGNOSIS — F1020 Alcohol dependence, uncomplicated: Secondary | ICD-10-CM | POA: Diagnosis not present

## 2023-11-11 DIAGNOSIS — R6520 Severe sepsis without septic shock: Secondary | ICD-10-CM | POA: Diagnosis not present

## 2023-11-11 DIAGNOSIS — J9611 Chronic respiratory failure with hypoxia: Secondary | ICD-10-CM | POA: Diagnosis not present

## 2023-11-11 DIAGNOSIS — J439 Emphysema, unspecified: Secondary | ICD-10-CM | POA: Diagnosis not present

## 2023-11-11 DIAGNOSIS — A419 Sepsis, unspecified organism: Secondary | ICD-10-CM | POA: Diagnosis not present

## 2023-11-11 DIAGNOSIS — Z48815 Encounter for surgical aftercare following surgery on the digestive system: Secondary | ICD-10-CM | POA: Diagnosis not present

## 2023-11-11 DIAGNOSIS — Z4801 Encounter for change or removal of surgical wound dressing: Secondary | ICD-10-CM | POA: Diagnosis not present

## 2023-11-11 DIAGNOSIS — J4489 Other specified chronic obstructive pulmonary disease: Secondary | ICD-10-CM | POA: Diagnosis not present

## 2023-11-11 NOTE — Telephone Encounter (Signed)
 FYI Only or Action Required?: FYI only for provider.  Patient was last seen in primary care on 10/25/2023 by Aletha Bene, MD. Called Nurse Triage reporting Blood Pressure Check. Symptoms began today. Interventions attempted: Rest, hydration, or home remedies. Symptoms are: gradually worsening.  Triage Disposition: Go to ED or PCP/Alternative with Approval  Patient/caregiver understands and will follow disposition?: no                    Copied from CRM [id number]. Topic: Clinical - Red Word Triage >> Nov 11, 2023  4:10 PM Powell HERO wrote: Red Word that prompted transfer to Nurse Triage: Rea, with home health  states that patients blood pressure has been dropping Reason for Disposition  Patient sounds very sick or weak to the triager  Answer Assessment - Initial Assessment Questions 1. BLOOD PRESSURE: What is the blood pressure? Did you take at least two measurements 5 minutes apart?     BP sitting 97/69 sitting BP 85/70 standing rechecked for BP 98/72 sitting, BP 93/69 HR 111 standing. 2. ONSET: When did you take your blood pressure?     Prior to call and now  3. HOW: How did you obtain the blood pressure? (e.g., visiting nurse, automatic home BP monitor)     Automatic BP monitor  by [facility] 4. HISTORY: Do you have a history of low blood pressure? What is your blood pressure normally?     Yes takes midodrine  5. MEDICINES: Are you taking any medications for blood pressure? If Yes, ask: Have they been changed recently?     Taking midodrine   6. PULSE RATE: Do you know what your pulse rate is?      111 7. OTHER SYMPTOMS: Have you been sick recently? Have you had a recent injury?     Recent surgery .  8. PREGNANCY: Is there any chance you are pregnant? When was your last menstrual period?     Na   Recommended ED due to no available appt late in afternoon. Patient does not want to go to ED. CAL notified patient doesn t feel she needs ED at this  time. PT , Rea reports patient HR elevates with every mobility or exertion.  Mild dizziness with walking but reports as better than it has been. Please advise  Protocols used: Blood Pressure - Low-A-AH

## 2023-11-11 NOTE — Telephone Encounter (Signed)
 Copied from CRM [id number]. Topic: General - Other >> Nov 11, 2023 12:36 PM Marissa P wrote: Reason for CRM: Erica with [facility] wanted to let nurse know heart rate jumped up to when sitting 98 and standing at 136, was staying between 125-136 when doing a walking activity. Patient stated its been going on for awhile and did not have any other symptoms.

## 2023-11-13 DIAGNOSIS — J4489 Other specified chronic obstructive pulmonary disease: Secondary | ICD-10-CM | POA: Diagnosis not present

## 2023-11-13 DIAGNOSIS — Z48815 Encounter for surgical aftercare following surgery on the digestive system: Secondary | ICD-10-CM | POA: Diagnosis not present

## 2023-11-13 DIAGNOSIS — J439 Emphysema, unspecified: Secondary | ICD-10-CM | POA: Diagnosis not present

## 2023-11-13 DIAGNOSIS — Z433 Encounter for attention to colostomy: Secondary | ICD-10-CM | POA: Diagnosis not present

## 2023-11-13 DIAGNOSIS — A419 Sepsis, unspecified organism: Secondary | ICD-10-CM | POA: Diagnosis not present

## 2023-11-13 DIAGNOSIS — F1020 Alcohol dependence, uncomplicated: Secondary | ICD-10-CM | POA: Diagnosis not present

## 2023-11-13 DIAGNOSIS — J9611 Chronic respiratory failure with hypoxia: Secondary | ICD-10-CM | POA: Diagnosis not present

## 2023-11-13 DIAGNOSIS — R6520 Severe sepsis without septic shock: Secondary | ICD-10-CM | POA: Diagnosis not present

## 2023-11-13 DIAGNOSIS — Z4801 Encounter for change or removal of surgical wound dressing: Secondary | ICD-10-CM | POA: Diagnosis not present

## 2023-11-13 NOTE — Telephone Encounter (Signed)
 Attempted to contact pt. No answer lvm w/ provider's urgent instructions.

## 2023-11-14 ENCOUNTER — Encounter: Payer: Self-pay | Admitting: Family Medicine

## 2023-11-14 ENCOUNTER — Ambulatory Visit: Admitting: Family Medicine

## 2023-11-14 ENCOUNTER — Other Ambulatory Visit: Payer: Self-pay | Admitting: Family Medicine

## 2023-11-14 VITALS — BP 107/70 | HR 98 | Temp 98.0°F | Ht 63.0 in | Wt 148.0 lb

## 2023-11-14 DIAGNOSIS — E785 Hyperlipidemia, unspecified: Secondary | ICD-10-CM | POA: Diagnosis not present

## 2023-11-14 DIAGNOSIS — Z1329 Encounter for screening for other suspected endocrine disorder: Secondary | ICD-10-CM | POA: Diagnosis not present

## 2023-11-14 DIAGNOSIS — J4489 Other specified chronic obstructive pulmonary disease: Secondary | ICD-10-CM | POA: Diagnosis not present

## 2023-11-14 DIAGNOSIS — Z13220 Encounter for screening for lipoid disorders: Secondary | ICD-10-CM | POA: Diagnosis not present

## 2023-11-14 DIAGNOSIS — R7309 Other abnormal glucose: Secondary | ICD-10-CM

## 2023-11-14 DIAGNOSIS — Z939 Artificial opening status, unspecified: Secondary | ICD-10-CM | POA: Insufficient documentation

## 2023-11-14 DIAGNOSIS — Z0001 Encounter for general adult medical examination with abnormal findings: Secondary | ICD-10-CM

## 2023-11-14 DIAGNOSIS — I10 Essential (primary) hypertension: Secondary | ICD-10-CM | POA: Diagnosis not present

## 2023-11-14 DIAGNOSIS — Z23 Encounter for immunization: Secondary | ICD-10-CM

## 2023-11-14 DIAGNOSIS — D649 Anemia, unspecified: Secondary | ICD-10-CM

## 2023-11-14 DIAGNOSIS — Z0000 Encounter for general adult medical examination without abnormal findings: Secondary | ICD-10-CM | POA: Diagnosis not present

## 2023-11-14 DIAGNOSIS — J439 Emphysema, unspecified: Secondary | ICD-10-CM | POA: Diagnosis not present

## 2023-11-14 DIAGNOSIS — R351 Nocturia: Secondary | ICD-10-CM

## 2023-11-14 DIAGNOSIS — F32A Depression, unspecified: Secondary | ICD-10-CM

## 2023-11-14 DIAGNOSIS — J449 Chronic obstructive pulmonary disease, unspecified: Secondary | ICD-10-CM

## 2023-11-14 DIAGNOSIS — F1020 Alcohol dependence, uncomplicated: Secondary | ICD-10-CM | POA: Diagnosis not present

## 2023-11-14 DIAGNOSIS — Z433 Encounter for attention to colostomy: Secondary | ICD-10-CM | POA: Diagnosis not present

## 2023-11-14 DIAGNOSIS — K769 Liver disease, unspecified: Secondary | ICD-10-CM | POA: Diagnosis not present

## 2023-11-14 DIAGNOSIS — Z4801 Encounter for change or removal of surgical wound dressing: Secondary | ICD-10-CM | POA: Diagnosis not present

## 2023-11-14 DIAGNOSIS — J9611 Chronic respiratory failure with hypoxia: Secondary | ICD-10-CM | POA: Diagnosis not present

## 2023-11-14 DIAGNOSIS — Z122 Encounter for screening for malignant neoplasm of respiratory organs: Secondary | ICD-10-CM

## 2023-11-14 DIAGNOSIS — Z48815 Encounter for surgical aftercare following surgery on the digestive system: Secondary | ICD-10-CM | POA: Diagnosis not present

## 2023-11-14 DIAGNOSIS — A419 Sepsis, unspecified organism: Secondary | ICD-10-CM | POA: Diagnosis not present

## 2023-11-14 DIAGNOSIS — R6520 Severe sepsis without septic shock: Secondary | ICD-10-CM | POA: Diagnosis not present

## 2023-11-14 LAB — URINALYSIS, ROUTINE W REFLEX MICROSCOPIC
Glucose, UA: NEGATIVE
Hgb urine dipstick: NEGATIVE
Leukocytes,Ua: NEGATIVE
Nitrite: NEGATIVE
RBC / HPF: NONE SEEN /HPF (ref 0–2)
Specific Gravity, Urine: 1.02 (ref 1.001–1.035)
pH: 6 (ref 5.0–8.0)

## 2023-11-14 LAB — MICROSCOPIC MESSAGE

## 2023-11-14 MED ORDER — FOLIC ACID 1 MG PO TABS: 1.0000 mg | ORAL_TABLET | Freq: Every day | ORAL | 0 refills | Status: AC

## 2023-11-14 MED ORDER — THIAMINE HCL 100 MG PO TABS: 100.0000 mg | ORAL_TABLET | Freq: Every day | ORAL | 0 refills | Status: AC

## 2023-11-14 MED ORDER — ROSUVASTATIN CALCIUM 10 MG PO TABS: 10.0000 mg | ORAL_TABLET | Freq: Every day | ORAL | 1 refills | Status: DC

## 2023-11-14 MED ORDER — MIDODRINE HCL 5 MG PO TABS
5.0000 mg | ORAL_TABLET | Freq: Two times a day (BID) | ORAL | 0 refills | Status: AC
Start: 2023-11-14 — End: ?

## 2023-11-14 NOTE — Assessment & Plan Note (Signed)
-

## 2023-11-14 NOTE — Assessment & Plan Note (Addendum)
 Today your medical history was reviewed and routine physical exam with labs was performed. Recommend 150 minutes of moderate intensity exercise weekly and consuming a well-balanced diet. Advised to stop smoking if a smoker, avoid smoking if a non-smoker, limit alcohol consumption to 1 drink per day for women and 2 drinks per day for men, and avoid illicit drug use. Counseled in mental health awareness and when to seek medical care. Vaccine maintenance discussed. Appropriate health maintenance items reviewed. Return to office in 1 year for annual physical exam. Lung cancer screening ordered, Hep B and PNA vaccines today.

## 2023-11-14 NOTE — Assessment & Plan Note (Signed)
 Welll controlled on Celexa  40mg  daily and PRN Xanax 

## 2023-11-14 NOTE — Progress Notes (Signed)
 Subjective:  HPI: Vicki [name] is a 58 y.o. female presenting on 11/14/2023 for Medical Management of Chronic Issues (1 yr f/u )   HPI Patient is in today for follow up for low BP readings and elevated HR when working with PT at home. Vicki [name] was hospitalized a month ago for septic shock r/t perforated diverticulitis and underwent a Hartman's procedure. She was DC home with ostomy and wound vac with home health RN. During one of her PT sessions the therapist noted her BP to be as low as 70/40s, she was sent to the ED and given IVF with resolution of symptoms. No recurrent episodes. She is taking Midodrine  5mg  BID and tolerating PT well.  Vicki [name] would also like to complete her CPE today as this is overdue.   Anxiety: well controlled on Celexa  40mg  daily and PRN Xanax , she is using Xanax  sparingly less than once daily, feels as though her symptoms are overall improving  COPD: home O2 4L, followed by Pulmonology, Trelegy and PRN Albuterol   HLD: bASA, on Crestor  10mg  daily, currently inactive  Nicotine  Dependence: quit smoking 36 days ago, congratulated her on this huge success, continue to abstain, referred for lung cancer screening CT  Liver lesion: incidental finding on CT, MRI ordered, overdue for GI follow up- referral placed today  Elevated TSH: has not started Synthroid, repeat TSH panel today, euthyroid on exam   Anemia: repeat CBC, not taking iron supplement  Health Maintenance  Topic Date Due   Lung Cancer Screening  07/12/2023   COVID-19 Vaccine (5 - Pfizer risk 2024-25 season) 11/30/2023 (Originally 08/16/2023)   Hepatitis B Vaccines (2 of 2 - CpG 2-dose series) 12/12/2023   INFLUENZA VACCINE  12/13/2023   Medicare Annual Wellness (AWV)  11/06/2024   MAMMOGRAM  12/03/2024   DTaP/Tdap/Td (3 - Td or Tdap) 06/13/2030   Pneumococcal Vaccine 63-40 Years old  Completed   Hepatitis C Screening  Completed   HIV Screening  Completed   HPV VACCINES  Aged Out    Meningococcal B Vaccine  Aged Out   Colonoscopy  Discontinued   Fecal DNA (Cologuard)  Discontinued   Zoster Vaccines- Shingrix  Discontinued     Review of Systems  Constitutional:  Positive for malaise/fatigue.  HENT: Negative.    Eyes: Negative.   Respiratory:  Positive for shortness of breath.   Cardiovascular: Negative.   Gastrointestinal: Negative.   Genitourinary:  Positive for frequency.  Musculoskeletal: Negative.   Skin: Negative.   Neurological: Negative.   Endo/Heme/Allergies: Negative.   Psychiatric/Behavioral:  The patient is nervous/anxious.   All other systems reviewed and are negative.   Relevant past medical history reviewed and updated as indicated.   Past Medical History:  Diagnosis Date   Allergy    Anxiety    Arthritis    Asthma    CAP (community acquired pneumonia) 04/07/2019   Carpal tunnel syndrome    Cataract    COPD (chronic obstructive pulmonary disease) (HCC)    Depression    Diverticulitis    Emphysema of lung (HCC)    Essential hypertension, benign 07/14/2019   Hyperlipidemia    Oxygen deficiency    Oxygen dependent    on 5 L cont [place]     Past Surgical History:  Procedure Laterality Date   ABDOMINAL HYSTERECTOMY  2009   APPENDECTOMY     ARTHROSCOPIC REPAIR ACL Left 2011/2014   x 2   BIOPSY  01/21/2020   Procedure: BIOPSY;  Surgeon: Shila,  Gustav GAILS, MD;  Location: [facility];  Service: Endoscopy;;  EGD and COLON   BUNIONECTOMY  2006   both   CARPAL TUNNEL RELEASE Bilateral 10/28/2013   Procedure: BILATERAL CARPAL TUNNEL RELEASE;  Surgeon: Arley JONELLE Curia, MD;  Location: [facility];  Service: Orthopedics;  Laterality: Bilateral;   CHOLECYSTECTOMY  1994   CHONDROPLASTY Right 08/31/2020   Procedure: CHONDROPLASTY;  Surgeon: Shari Sieving, MD;  Location: [facility];  Service: Orthopedics;  Laterality: Right;   COLONOSCOPY WITH PROPOFOL  N/A 01/21/2020   Procedure: COLONOSCOPY WITH PROPOFOL ;  Surgeon:  Shila Gustav GAILS, MD;  Location: [facility];  Service: Endoscopy;  Laterality: N/A;   DILATION AND CURETTAGE OF UTERUS     ESOPHAGOGASTRODUODENOSCOPY (EGD) WITH PROPOFOL  N/A 01/21/2020   Procedure: ESOPHAGOGASTRODUODENOSCOPY (EGD) WITH PROPOFOL ;  Surgeon: Shila Gustav GAILS, MD;  Location: [facility];  Service: Endoscopy;  Laterality: N/A;   KNEE ARTHROSCOPY WITH ANTERIOR CRUCIATE LIGAMENT (ACL) REPAIR Right 08/31/2020   Procedure: RIGHT KNEE ARTHROSCOPY WITH ANTERIOR CRUCIATE LIGAMENT (ACL) REPAIR MEDIAL AND LATERAL  MENISECTOMY;  Surgeon: Shari Sieving, MD;  Location: [facility];  Service: Orthopedics;  Laterality: Right;  FEMORAL NERVE BLOCK   LAPAROTOMY N/A 10/10/2023   Procedure: EXPLORATORY LAPAROTOMY, PARTIAL COLECTOMY AND COLOSTOMY;  Surgeon: Teresa Lonni HERO, MD;  Location: [facility];  Service: General;  Laterality: N/A;  EXPLORATORY LAPAROTOMY , PARTIAL COLECTOMY AND COLOSTOMY   POLYPECTOMY  01/21/2020   Procedure: POLYPECTOMY;  Surgeon: Shila Gustav GAILS, MD;  Location: [facility];  Service: Endoscopy;;   TUBAL LIGATION  2004    Allergies and medications reviewed and updated.   Current Outpatient Medications:    acetaminophen  (TYLENOL ) 500 MG tablet, Take 1 tablet (500 mg total) by mouth every 8 (eight) hours as needed for moderate pain (pain score 4-6) or mild pain (pain score 1-3)., Disp: 30 tablet, Rfl: 0   albuterol  (VENTOLIN  HFA) 108 (90 Base) MCG/ACT inhaler, INHALE 2 PUFFS INTO THE LUNGS TWICE A DAY, Disp: 18 each, Rfl: 11   ALPRAZolam  (XANAX ) 1 MG tablet, Take 1 tablet (1 mg total) by mouth 2 (two) times daily as needed for anxiety., Disp: 30 tablet, Rfl: 0   ASPIRIN  LOW DOSE 81 MG EC tablet, TAKE 1 TABLET BY MOUTH 2 TIMES DAILY., Disp: 180 tablet, Rfl: 1   citalopram  (CELEXA ) 40 MG tablet, TAKE 1 TABLET BY MOUTH EVERY DAY, Disp: 90 tablet, Rfl: 1   EPINEPHrine  (EPIPEN  2-PAK) 0.3 mg/0.3 mL IJ SOAJ injection, Inject 0.3 mg into the muscle as needed for  anaphylaxis., Disp: 1 each, Rfl: 3   Fluticasone -Umeclidin-Vilant (TRELEGY ELLIPTA ) 200-62.5-25 MCG/ACT AEPB, TAKE 1 PUFF BY MOUTH EVERY DAY, Disp: 60 each, Rfl: 11   omeprazole  (PRILOSEC) 40 MG capsule, Take 1 capsule (40 mg total) by mouth 2 (two) times daily. Take 30 mins. Prior to breakfast and supper, Disp: 60 capsule, Rfl: 11   ondansetron  (ZOFRAN ) 8 MG tablet, Take 1 tablet (8 mg total) by mouth every 8 (eight) hours as needed for nausea or vomiting., Disp: 20 tablet, Rfl: 0   potassium chloride  SA (KLOR-CON  M) 20 MEQ tablet, Take 1 tablet (20 mEq total) by mouth daily., Disp: 90 tablet, Rfl: 3   rizatriptan  (MAXALT ) 10 MG tablet, TAKE 1 TABLET BY MOUTH AS NEEDED FOR MIGRAINE. MAY REPEAT IN 2 HOURS IF NEEDED STRENGTH: 10 MG, Disp: 10 tablet, Rfl: 0   rOPINIRole  (REQUIP ) 2 MG tablet, TAKE 1/2 TO 1 TABLETS BY MOUTH AT BEDTIME., Disp: 90 tablet,  Rfl: 3   triamcinolone  ointment (KENALOG ) 0.5 %, Apply topically to your back 2 (two) times daily., Disp: 30 g, Rfl: 0   folic acid  (FOLVITE ) 1 MG tablet, Take 1 tablet (1 mg total) by mouth daily., Disp: 30 tablet, Rfl: 0   midodrine  (PROAMATINE ) 5 MG tablet, Take 1 tablet (5 mg total) by mouth 2 (two) times daily with a meal., Disp: 180 tablet, Rfl: 0   ondansetron  (ZOFRAN ) 4 MG tablet, Take 1 tablet (4 mg total) by mouth every 8 (eight) hours as needed for nausea or vomiting. (Patient not taking: Reported on 11/14/2023), Disp: 15 tablet, Rfl: 0   rosuvastatin  (CRESTOR ) 10 MG tablet, Take 1 tablet (10 mg total) by mouth daily., Disp: 90 tablet, Rfl: 1   thiamine  (VITAMIN B1) 100 MG tablet, Take 1 tablet (100 mg total) by mouth daily., Disp: 30 tablet, Rfl: 0  Allergies  Allergen Reactions   Jasmine Oil Shortness Of Breath and Swelling   Levaquin  [Levofloxacin ] Shortness Of Breath, Itching, Swelling and Rash   Penicillins Anaphylaxis and Hives    Tolerated ceftriaxone    Aleve [Naproxen Sodium] Itching   Chlorhexidine  Other (See Comments)    Unknown  reaction   Depo-Provera [Medroxyprogesterone Acetate] Other (See Comments)    Severe acne   Doxycycline  Other (See Comments)    Raises blood pressure    Tramadol Other (See Comments)    Kept her up all night    Objective:   BP 107/70   Pulse 98   Temp 98 F (36.7 C)   Ht 5' 3 (1.6 m)   Wt 148 lb (67.1 kg)   SpO2 97%   BMI 26.22 kg/m      11/14/2023   11:58 AM 11/06/2023    6:18 PM 11/06/2023    4:16 PM  Vitals with BMI  Height 5' 3    Weight 148 lbs    BMI 26.22    Systolic 107 112 898  Diastolic 70 70 73  Pulse 98 79 78     Physical Exam Vitals (using wheelchair) and nursing note reviewed.  Constitutional:      Appearance: Normal appearance. She is normal weight.  HENT:     Head: Normocephalic and atraumatic.     Right Ear: Tympanic membrane, ear canal and external ear normal.     Left Ear: Tympanic membrane, ear canal and external ear normal.     Nose: Nose normal.     Mouth/Throat:     Mouth: Mucous membranes are moist.     Pharynx: Oropharynx is clear.  Eyes:     Extraocular Movements: Extraocular movements intact.     Conjunctiva/sclera: Conjunctivae normal.     Pupils: Pupils are equal, round, and reactive to light.  Cardiovascular:     Rate and Rhythm: Regular rhythm. Tachycardia present.     Pulses: Normal pulses.     Heart sounds: Normal heart sounds.  Pulmonary:     Effort: Pulmonary effort is normal.     Breath sounds: Normal breath sounds.  Abdominal:     General: Bowel sounds are normal.     Palpations: Abdomen is soft.     Comments: LLQ ostomy, UTA stoma or surrounding skin  Musculoskeletal:        General: Normal range of motion.     Cervical back: Normal range of motion and neck supple.  Skin:    General: Skin is warm and dry.     Capillary Refill: Capillary refill takes less than 2  seconds.     Findings: Wound present.         Comments: Midline abdominal wound with dressing CDI  Neurological:     General: No focal deficit present.      Mental Status: She is alert and oriented to person, place, and time. Mental status is at baseline.  Psychiatric:        Mood and Affect: Mood normal.        Behavior: Behavior normal.        Thought Content: Thought content normal.        Judgment: Judgment normal.     Assessment & Plan:  Physical exam, annual Assessment & Plan: Today your medical history was reviewed and routine physical exam with labs was performed. Recommend 150 minutes of moderate intensity exercise weekly and consuming a well-balanced diet. Advised to stop smoking if a smoker, avoid smoking if a non-smoker, limit alcohol consumption to 1 drink per day for women and 2 drinks per day for men, and avoid illicit drug use. Counseled in mental health awareness and when to seek medical care. Vaccine maintenance discussed. Appropriate health maintenance items reviewed. Return to office in 1 year for annual physical exam. Lung cancer screening ordered, Hep B and PNA vaccines today.  Orders: -     CBC with Differential/Platelet -     Comprehensive metabolic panel with GFR -     Hemoglobin A1c -     Lipid panel -     Thyroid Panel With TSH -     CT CHEST LUNG CANCER SCREENING LOW DOSE WO CONTRAST; Future -     Ambulatory referral to Gastroenterology -     Urinalysis, Routine w reflex microscopic  Liver disease Assessment & Plan: History of hepatic steatosis with liver lesion on recent CT. Recommended follow up with MRI ordered today. Referred back to GI for consultation and annual follow up for chronic gastritis, diverticulitis, recent ostomy, GERD.  Orders: -     MR LIVER W CONTRAST; Future -     Ambulatory referral to Gastroenterology  Screening for thyroid disorder Assessment & Plan: Repeat TSH today, not taking Synthroid  Orders: -     Thyroid Panel With TSH  Anemia, unspecified type Assessment & Plan: CBC today, not taking iron  Orders: -     CBC with Differential/Platelet  Encounter for screening  for lung cancer Assessment & Plan: CT lung cancer screening  Orders: -     CT CHEST LUNG CANCER SCREENING LOW DOSE WO CONTRAST; Future  Nocturia Assessment & Plan: UA ordered, denies dysuria, polyuria, fever, chills, body aches, hematuria.   Orders: -     Urinalysis, Routine w reflex microscopic  Screening for lipoid disorders -     Lipid panel  Depression, unspecified depression type Assessment & Plan: Welll controlled on Celexa  40mg  daily and PRN Xanax    Hyperlipidemia, unspecified hyperlipidemia type Assessment & Plan: Continue Rosuvastatin  10mg  daily. I recommend consuming a heart healthy diet such as Mediterranean diet or DASH diet with whole grains, fruits, vegetable, fish, lean meats, nuts, and olive oil. Limit sweets and processed foods. I also encourage moderate intensity exercise 150 minutes weekly. This is 3-5 times weekly for 30-50 minutes each session. Goal should be pace of 3 miles/hours, or walking 1.5 miles in 30 minutes. The 10-year ASCVD risk score (Arnett DK, et al., 2019) is: 1.9%   Orders: -     Lipid panel  Elevated glucose level Assessment & Plan: A1c today  Orders: -  Hemoglobin A1c  Essential hypertension, benign Assessment & Plan: Has been hypotensive since hospitalization, BP meds were DC and started on Midodrine  5mg  BID. Monitoring at home and will report low BP and symptoms.   Orders: -     CBC with Differential/Platelet -     Comprehensive metabolic panel with GFR -     Hemoglobin A1c -     Lipid panel  Encounter for immunization -     Pneumococcal conjugate vaccine 20-valent -     Heplisav-B (HepB-CPG) Vaccine  COPD, severe (HCC) Assessment & Plan: Followed by Pulmonology. ADLs severely limited. She is on 5L nasal cannula. Continue Trelegy and PRN Albuterol .    History of creation of ostomy [facility]) Assessment & Plan: Followed by home health RN three times weekly, reports stoma is pink and moist with surrounding skin CDI, has  supplies for changing and receiving education from [facility].    Other orders -     Folic Acid ; Take 1 tablet (1 mg total) by mouth daily.  Dispense: 30 tablet; Refill: 0 -     Midodrine  HCl; Take 1 tablet (5 mg total) by mouth 2 (two) times daily with a meal.  Dispense: 180 tablet; Refill: 0 -     Rosuvastatin  Calcium ; Take 1 tablet (10 mg total) by mouth daily.  Dispense: 90 tablet; Refill: 1 -     Thiamine  HCl; Take 1 tablet (100 mg total) by mouth daily.  Dispense: 30 tablet; Refill: 0     Follow up plan: Return in about 3 months (around 02/14/2024) for chronic follow-up with labs 1 week prior.  Jeoffrey GORMAN Barrio, FNP

## 2023-11-14 NOTE — Assessment & Plan Note (Signed)
 Continue Rosuvastatin  10mg  daily. I recommend consuming a heart healthy diet such as Mediterranean diet or DASH diet with whole grains, fruits, vegetable, fish, lean meats, nuts, and olive oil. Limit sweets and processed foods. I also encourage moderate intensity exercise 150 minutes weekly. This is 3-5 times weekly for 30-50 minutes each session. Goal should be pace of 3 miles/hours, or walking 1.5 miles in 30 minutes. The 10-year ASCVD risk score (Arnett DK, et al., 2019) is: 1.9%

## 2023-11-14 NOTE — Assessment & Plan Note (Signed)
-  CT lung cancer screening

## 2023-11-14 NOTE — Assessment & Plan Note (Signed)
 Has been hypotensive since hospitalization, BP meds were DC and started on Midodrine  5mg  BID. Monitoring at home and will report low BP and symptoms.

## 2023-11-14 NOTE — Assessment & Plan Note (Signed)
 UA ordered, denies dysuria, polyuria, fever, chills, body aches, hematuria.

## 2023-11-14 NOTE — Assessment & Plan Note (Signed)
 History of hepatic steatosis with liver lesion on recent CT. Recommended follow up with MRI ordered today. Referred back to GI for consultation and annual follow up for chronic gastritis, diverticulitis, recent ostomy, GERD.

## 2023-11-14 NOTE — Assessment & Plan Note (Signed)
 Followed by home health RN three times weekly, reports stoma is pink and moist with surrounding skin CDI, has supplies for changing and receiving education from [facility].

## 2023-11-14 NOTE — Assessment & Plan Note (Signed)
 Followed by Pulmonology. ADLs severely limited. She is on 5L nasal cannula. Continue Trelegy and PRN Albuterol .

## 2023-11-14 NOTE — Assessment & Plan Note (Signed)
 Repeat TSH today, not taking Synthroid

## 2023-11-14 NOTE — Assessment & Plan Note (Signed)
 CBC today, not taking iron

## 2023-11-18 DIAGNOSIS — R6520 Severe sepsis without septic shock: Secondary | ICD-10-CM | POA: Diagnosis not present

## 2023-11-18 DIAGNOSIS — Z48815 Encounter for surgical aftercare following surgery on the digestive system: Secondary | ICD-10-CM | POA: Diagnosis not present

## 2023-11-18 DIAGNOSIS — J9611 Chronic respiratory failure with hypoxia: Secondary | ICD-10-CM | POA: Diagnosis not present

## 2023-11-18 DIAGNOSIS — F1020 Alcohol dependence, uncomplicated: Secondary | ICD-10-CM | POA: Diagnosis not present

## 2023-11-18 DIAGNOSIS — J439 Emphysema, unspecified: Secondary | ICD-10-CM | POA: Diagnosis not present

## 2023-11-18 DIAGNOSIS — Z4801 Encounter for change or removal of surgical wound dressing: Secondary | ICD-10-CM | POA: Diagnosis not present

## 2023-11-18 DIAGNOSIS — J4489 Other specified chronic obstructive pulmonary disease: Secondary | ICD-10-CM | POA: Diagnosis not present

## 2023-11-18 DIAGNOSIS — A419 Sepsis, unspecified organism: Secondary | ICD-10-CM | POA: Diagnosis not present

## 2023-11-18 DIAGNOSIS — Z433 Encounter for attention to colostomy: Secondary | ICD-10-CM | POA: Diagnosis not present

## 2023-11-19 ENCOUNTER — Other Ambulatory Visit: Payer: Self-pay | Admitting: *Deleted

## 2023-11-19 DIAGNOSIS — J441 Chronic obstructive pulmonary disease with (acute) exacerbation: Secondary | ICD-10-CM

## 2023-11-20 ENCOUNTER — Other Ambulatory Visit: Payer: Self-pay | Admitting: Family Medicine

## 2023-11-20 ENCOUNTER — Ambulatory Visit: Payer: Self-pay | Admitting: Family Medicine

## 2023-11-20 ENCOUNTER — Telehealth: Payer: Self-pay | Admitting: Family Medicine

## 2023-11-20 ENCOUNTER — Ambulatory Visit: Payer: Self-pay

## 2023-11-20 DIAGNOSIS — J439 Emphysema, unspecified: Secondary | ICD-10-CM | POA: Diagnosis not present

## 2023-11-20 DIAGNOSIS — F1020 Alcohol dependence, uncomplicated: Secondary | ICD-10-CM | POA: Diagnosis not present

## 2023-11-20 DIAGNOSIS — A419 Sepsis, unspecified organism: Secondary | ICD-10-CM | POA: Diagnosis not present

## 2023-11-20 DIAGNOSIS — Z433 Encounter for attention to colostomy: Secondary | ICD-10-CM | POA: Diagnosis not present

## 2023-11-20 DIAGNOSIS — Z48815 Encounter for surgical aftercare following surgery on the digestive system: Secondary | ICD-10-CM | POA: Diagnosis not present

## 2023-11-20 DIAGNOSIS — J4489 Other specified chronic obstructive pulmonary disease: Secondary | ICD-10-CM | POA: Diagnosis not present

## 2023-11-20 DIAGNOSIS — Z4801 Encounter for change or removal of surgical wound dressing: Secondary | ICD-10-CM | POA: Diagnosis not present

## 2023-11-20 DIAGNOSIS — R6520 Severe sepsis without septic shock: Secondary | ICD-10-CM | POA: Diagnosis not present

## 2023-11-20 DIAGNOSIS — J9611 Chronic respiratory failure with hypoxia: Secondary | ICD-10-CM | POA: Diagnosis not present

## 2023-11-20 LAB — CBC WITH DIFFERENTIAL/PLATELET
Absolute Lymphocytes: 2017 {cells}/uL (ref 850–3900)
Absolute Monocytes: 1591 {cells}/uL — ABNORMAL HIGH (ref 200–950)
Basophils Absolute: 44 {cells}/uL (ref 0–200)
Basophils Relative: 0.4 %
Eosinophils Absolute: 251 {cells}/uL (ref 15–500)
Eosinophils Relative: 2.3 %
HCT: 32.5 % — ABNORMAL LOW (ref 35.0–45.0)
Hemoglobin: 10 g/dL — ABNORMAL LOW (ref 11.7–15.5)
MCH: 29.9 pg (ref 27.0–33.0)
MCHC: 30.8 g/dL — ABNORMAL LOW (ref 32.0–36.0)
MCV: 97.3 fL (ref 80.0–100.0)
MPV: 10.8 fL (ref 7.5–12.5)
Monocytes Relative: 14.6 %
Neutro Abs: 6998 {cells}/uL (ref 1500–7800)
Neutrophils Relative %: 64.2 %
Platelets: 383 Thousand/uL (ref 140–400)
RBC: 3.34 Million/uL — ABNORMAL LOW (ref 3.80–5.10)
RDW: 13.5 % (ref 11.0–15.0)
Total Lymphocyte: 18.5 %
WBC: 10.9 Thousand/uL — ABNORMAL HIGH (ref 3.8–10.8)

## 2023-11-20 LAB — COMPREHENSIVE METABOLIC PANEL WITH GFR
AG Ratio: 0.9 (calc) — ABNORMAL LOW (ref 1.0–2.5)
ALT: 16 U/L (ref 6–29)
AST: 109 U/L — ABNORMAL HIGH (ref 10–35)
Albumin: 3.4 g/dL — ABNORMAL LOW (ref 3.6–5.1)
Alkaline phosphatase (APISO): 162 U/L — ABNORMAL HIGH (ref 37–153)
BUN: 7 mg/dL (ref 7–25)
CO2: 26 mmol/L (ref 20–32)
Calcium: 7.8 mg/dL — ABNORMAL LOW (ref 8.6–10.4)
Chloride: 101 mmol/L (ref 98–110)
Creat: 0.51 mg/dL (ref 0.50–1.03)
Globulin: 3.7 g/dL (ref 1.9–3.7)
Glucose, Bld: 104 mg/dL — ABNORMAL HIGH (ref 65–99)
Potassium: 4.7 mmol/L (ref 3.5–5.3)
Sodium: 138 mmol/L (ref 135–146)
Total Bilirubin: 0.4 mg/dL (ref 0.2–1.2)
Total Protein: 7.1 g/dL (ref 6.1–8.1)
eGFR: 109 mL/min/1.73m2 (ref >=60)

## 2023-11-20 LAB — HEMOGLOBIN A1C
Hgb A1c MFr Bld: 5 % (ref <5.7)
Mean Plasma Glucose: 97 mg/dL
eAG (mmol/L): 5.4 mmol/L

## 2023-11-20 LAB — LIPID PANEL
Cholesterol: 177 mg/dL (ref <200)
HDL: 50 mg/dL (ref >=50)
LDL Cholesterol (Calc): 99 mg/dL
Non-HDL Cholesterol (Calc): 127 mg/dL (ref <130)
Total CHOL/HDL Ratio: 3.5 (calc) (ref <5.0)
Triglycerides: 179 mg/dL — ABNORMAL HIGH (ref <150)

## 2023-11-20 LAB — MAGNESIUM: Magnesium: 0.8 mg/dL — CL (ref 1.5–2.5)

## 2023-11-20 LAB — THYROID PANEL WITH TSH
Free Thyroxine Index: 2.4 (ref 1.4–3.8)
T3 Uptake: 32 % (ref 22–35)
T4, Total: 7.6 ug/dL (ref 5.1–11.9)
TSH: 2.22 m[IU]/L (ref 0.40–4.50)

## 2023-11-20 LAB — TEST AUTHORIZATION

## 2023-11-20 MED ORDER — CALCIUM CARBONATE ANTACID 500 MG PO CHEW: 1.0000 | CHEWABLE_TABLET | Freq: Every day | ORAL | 0 refills | Status: DC

## 2023-11-20 MED ORDER — MAGNESIUM CHLORIDE 64 MG PO TABS: 2.0000 | ORAL_TABLET | Freq: Three times a day (TID) | ORAL | 0 refills | Status: DC

## 2023-11-20 NOTE — Telephone Encounter (Signed)
 Spoke with Vicki [name], verified 2 patient identifiers. Discussed lab results including low Mg and Ca. No symptoms of hypomagnesia or hypocalcemia including muscle spasms or cramps, seizures, AMS, palpitations. Will hold Omeprazole  and supplement Mg and Ca, recheck CMP with Mg and PTH Monday.

## 2023-11-20 NOTE — Telephone Encounter (Signed)
 FYI Only or Action Required?: FYI only for provider.  Patient was last seen in primary care on 11/14/2023 by Kayla Jeoffrey RAMAN, FNP.  Called Nurse Triage reporting Vomiting.  Symptoms began x 2 days.  Interventions attempted: Prescription medications: Zofran .  Symptoms are: unchanged.  Triage Disposition: Go to ED Now (or PCP Triage), Home Care  Patient/caregiver understands and will follow disposition?: Yes    Home Health nurse called to report patient has vomitted x4 times in the last 20 minutes. The vomiting began x 2 days. She has Zofran  for the vomiting, which is not providing relief, and patient has 2 more pills left. Abdominal pain noted as a 6/10. Patient cannot keep down food or water. She is vomiting bile. No diarrhea noted. Vital were WNL per Home heath nurse. No vomiting of blood, no other symptoms noted. Patient has appointment for 7/11, but will seek care in the ED today if symptoms persist.                               Copied from CRM [id number]. Topic: Clinical - Red Word Triage >> Nov 20, 2023  3:23 PM Avram MATSU wrote: Red Word that prompted transfer to Nurse Triage: constant vomiting and its like acid. Reason for Disposition  [1] SEVERE vomiting (e.g., 6 or more times/day, vomits everything) BUT [2] hydrated  [1] SEVERE vomiting (e.g., 6 or more times/day) AND [2] present > 8 hours (Exception: Patient sounds well, is drinking liquids, does not sound dehydrated, and vomiting has lasted less than 24 hours.)  Protocols used: Vomiting-A-AH

## 2023-11-21 ENCOUNTER — Ambulatory Visit: Admitting: Pulmonary Disease

## 2023-11-21 ENCOUNTER — Encounter

## 2023-11-21 ENCOUNTER — Other Ambulatory Visit: Payer: Self-pay | Admitting: Family Medicine

## 2023-11-21 ENCOUNTER — Encounter: Payer: Self-pay | Admitting: Pulmonary Disease

## 2023-11-22 ENCOUNTER — Ambulatory Visit: Payer: Self-pay | Admitting: Family Medicine

## 2023-11-22 ENCOUNTER — Ambulatory Visit (INDEPENDENT_AMBULATORY_CARE_PROVIDER_SITE_OTHER): Payer: Self-pay | Admitting: Family Medicine

## 2023-11-22 ENCOUNTER — Encounter: Payer: Self-pay | Admitting: Family Medicine

## 2023-11-22 ENCOUNTER — Ambulatory Visit: Payer: Self-pay

## 2023-11-22 ENCOUNTER — Other Ambulatory Visit: Payer: Self-pay | Admitting: Family Medicine

## 2023-11-22 ENCOUNTER — Ambulatory Visit
Admission: RE | Admit: 2023-11-22 | Discharge: 2023-11-22 | Disposition: A | Discharge status: Home / Self Care | Source: Ambulatory Visit | Attending: Family Medicine | Admitting: Family Medicine

## 2023-11-22 VITALS — BP 92/62 | HR 112 | Temp 98.5°F | Ht 63.0 in | Wt 148.0 lb

## 2023-11-22 DIAGNOSIS — F419 Anxiety disorder, unspecified: Secondary | ICD-10-CM

## 2023-11-22 DIAGNOSIS — M79602 Pain in left arm: Secondary | ICD-10-CM

## 2023-11-22 DIAGNOSIS — F32A Depression, unspecified: Secondary | ICD-10-CM

## 2023-11-22 DIAGNOSIS — E785 Hyperlipidemia, unspecified: Secondary | ICD-10-CM

## 2023-11-22 DIAGNOSIS — I9589 Other hypotension: Secondary | ICD-10-CM

## 2023-11-22 DIAGNOSIS — R112 Nausea with vomiting, unspecified: Secondary | ICD-10-CM

## 2023-11-22 DIAGNOSIS — R0781 Pleurodynia: Secondary | ICD-10-CM

## 2023-11-22 DIAGNOSIS — J9611 Chronic respiratory failure with hypoxia: Secondary | ICD-10-CM | POA: Diagnosis not present

## 2023-11-22 DIAGNOSIS — R6520 Severe sepsis without septic shock: Secondary | ICD-10-CM | POA: Diagnosis not present

## 2023-11-22 DIAGNOSIS — Z433 Encounter for attention to colostomy: Secondary | ICD-10-CM | POA: Diagnosis not present

## 2023-11-22 DIAGNOSIS — J4489 Other specified chronic obstructive pulmonary disease: Secondary | ICD-10-CM | POA: Diagnosis not present

## 2023-11-22 DIAGNOSIS — M79622 Pain in left upper arm: Secondary | ICD-10-CM | POA: Diagnosis not present

## 2023-11-22 DIAGNOSIS — J439 Emphysema, unspecified: Secondary | ICD-10-CM | POA: Diagnosis not present

## 2023-11-22 DIAGNOSIS — Z48815 Encounter for surgical aftercare following surgery on the digestive system: Secondary | ICD-10-CM | POA: Diagnosis not present

## 2023-11-22 DIAGNOSIS — A419 Sepsis, unspecified organism: Secondary | ICD-10-CM | POA: Diagnosis not present

## 2023-11-22 DIAGNOSIS — F1020 Alcohol dependence, uncomplicated: Secondary | ICD-10-CM | POA: Diagnosis not present

## 2023-11-22 DIAGNOSIS — Z4801 Encounter for change or removal of surgical wound dressing: Secondary | ICD-10-CM | POA: Diagnosis not present

## 2023-11-22 MED ORDER — ROSUVASTATIN CALCIUM 10 MG PO TABS: 10.0000 mg | ORAL_TABLET | Freq: Every day | ORAL | 1 refills | Status: DC

## 2023-11-22 MED ORDER — PROMETHAZINE HCL 25 MG PO TABS: 25.0000 mg | ORAL_TABLET | Freq: Three times a day (TID) | ORAL | 0 refills | Status: DC | PRN

## 2023-11-22 MED ORDER — MAGNESIUM CHLORIDE 64 MG PO TABS: 2.0000 | ORAL_TABLET | Freq: Three times a day (TID) | ORAL | 0 refills | Status: DC

## 2023-11-22 MED ORDER — CLONAZEPAM 0.5 MG PO TABS: 0.5000 mg | ORAL_TABLET | Freq: Two times a day (BID) | ORAL | 0 refills | Status: DC | PRN

## 2023-11-22 MED ORDER — CALCIUM CARBONATE ANTACID 500 MG PO CHEW: 1.0000 | CHEWABLE_TABLET | Freq: Every day | ORAL | 0 refills | Status: AC

## 2023-11-22 MED ORDER — ONDANSETRON HCL 8 MG PO TABS: 8.0000 mg | ORAL_TABLET | Freq: Three times a day (TID) | ORAL | 1 refills | Status: DC | PRN

## 2023-11-22 NOTE — Progress Notes (Signed)
 Patient Office Visit  Assessment & Plan:  Nausea and vomiting, unspecified vomiting type -     Ondansetron  HCl; Take 1 tablet (8 mg total) by mouth every 8 (eight) hours as needed for nausea or vomiting.  Dispense: 30 tablet; Refill: 1 -     Magnesium  Chloride; Take 2 tablets by mouth in the morning, at noon, and at bedtime.  Dispense: 30 tablet; Refill: 0 -     Calcium  Carbonate Antacid; Chew 1 tablet (200 mg of elemental calcium  total) by mouth daily.  Dispense: 30 tablet; Refill: 0 -     CBC with Differential/Platelet -     Comprehensive metabolic panel with GFR -     Lipase -     Promethazine  HCl; Take 1 tablet (25 mg total) by mouth every 8 (eight) hours as needed for nausea or vomiting.  Dispense: 20 tablet; Refill: 0  Other specified hypotension  Hyperlipidemia, unspecified hyperlipidemia type -     Rosuvastatin  Calcium ; Take 1 tablet (10 mg total) by mouth daily.  Dispense: 90 tablet; Refill: 1  Rib pain on left side  Left arm pain -     DG Humerus Left; Future  Anxiety and depression -     clonazePAM ; Take 1 tablet (0.5 mg total) by mouth 2 (two) times daily as needed for anxiety.  Dispense: 30 tablet; Refill: 0   Assessment and Plan    Nausea and Vomiting ongoing Zofran  ineffective. Concerns about dehydration and surgical complications. Phenergan  considered with caution due to sedative effects. - Order blood work for electrolytes and dehydration. - Prescribe Phenergan . - Advise ER visit if symptoms worsen or dehydration suspected. patient and husband aware that this would be the best option but she is declining today.   Hypotension Blood pressure 92/62. On Midodrine , which increases heart rate. Dehydration may contribute to hypotension. Increasing Midodrine  risks tachycardia. - Monitor blood pressure. - Advise against increasing Midodrine . - Encourage increased fluid intake.  Electrolyte Imbalance Low magnesium  and calcium  likely related to surgery and wound  healing. Unable to take supplements due to nausea. - Resend prescription for magnesium  and calcium .  Rib Pain left side Pain under breast possibly due to fall. Concerns about rib fracture or contusion. X-ray ordered. - Order x-ray with rib detail.  Arm Pain Tenderness and pain following fall. Concerns about fracture. X-ray ordered. - Order x-ray of the arm.  Anxiety Increased anxiety possibly related to health issues and pain. Considering switch from Xanax  to Klonopin . - Prescribe Klonopin  instead of Xanax .   Hyperlipidemia Requires refill of Crestor . Currently taking spouse's 20 mg tablets due to running out. - Refill prescription for Crestor  10 mg.  COPD On 4 liters of oxygen. Recently quit smoking but resumed. Encouraged smoking cessation. - Encourage smoking cessation. - Continue oxygen therapy.  Follow-up Multiple ongoing health issues. Discussion about potential colostomy reversal in six months. Home health care provides wound care. - Follow up with surgeon in six months regarding potential colostomy reversal. - Coordinate with home health care for ongoing wound care and monitoring.      X-rays negative for acute fractures- left message on phone Friday evening  Return in about 3 months (around 02/22/2024), or if symptoms worsen or fail to improve.   Subjective:    Patient ID: Vicki [name], female    DOB: 02-May-1966  Age: 58 y.o. MRN: [id number]  Chief Complaint  Patient presents with   Fall   Nausea    HPI Discussed the use of AI  scribe software for clinical note transcription with the patient, who gave verbal consent to proceed.  History of Present Illness        Vicki [name] is a 58 year old female with a history of sepsis/perforated diverticulitis/abscess and colon surgery who presents with nausea and vomiting, hypotension, left sided rib pain and left upper arm pain due to recent fall.   She has been experiencing nausea and vomiting since  Sunday evening. Initially, she felt better after vomiting, but symptoms returned the following morning. No fever or chills have been noted. Her usual medication, Zofran  4 mg, has not been effective and wonders if she can get Phenergan  instead or higher dosage of Zofran   She has a significant medical history of sepsis and colon surgery/s/p Hartman's procedure, during which half of her colon was removed. She is apprehensive about the complexity and duration of a potential reversal procedure. Her surgical follow-up on July 1st was completed, and a follow-up was planned in six months.  She experiences low blood pressure, with a recent reading of 92/62 mmHg, and has had a recent fall. She is on Midodrine  5 mg twice daily to manage her low blood pressure, which also increases her heart rate. Her physical therapist has observed low blood pressure during sessions but then her BP normalizes after a few minutes.   Her current medications include Zofran  4 mg for nausea, Midodrine  5 mg twice daily, and Xanax  1 mg for anxiety, which she has been using more frequently due to recent stress and difficulty sleeping. Patient has not used Klonopin  for anxiety. She also requires a refill for her cholesterol medication, Crestor  10 mg.  She has a history of diverticulitis, which lead to perforation/sepsis. Overall she is having more frequent episodes of nausea and vomiting. She does not have a gallbladder, which may contribute to her digestive issues. Patient did not call her general surgeon re these symptoms.  Patient is aware that it was recommended that she go to ED but does not want to go there.   She reports a recent fall resulting in pain under her breast, suspecting a rib injury, and pain in her left arm and finger, suspecting a fracture. She experiences increased urination at night but denies any burning or discomfort.  Her social history includes smoking, which she had quit for 38 days but has since resumed at a  reduced rate of a couple of cigarettes a day. She is on four liters of oxygen due to COPD which is unchanged compared to previous visits. . Physical Exam VITALS: BP- 92/62 MEASUREMENTS: Weight- 148. CHEST: Tenderness under the breast, possible rib fracture. CARDIOVASCULAR: Tachycardia with regular rhythm. EXTREMITIES: Tenderness in the left upper arm, possible fracture. Results LABS Magnesium : low (11/20/2023) Calcium : low (11/20/2023) Assessment & Plan Nausea and Vomiting ongoing Zofran  ineffective. Concerns about dehydration and surgical complications. Phenergan  considered with caution due to sedative effects. - Order blood work for electrolytes and dehydration. - Prescribe Phenergan . - Advise ER visit if symptoms worsen since dehydration suspected. patient and husband aware that this would be the best option but she is declining today.   Hypotension Blood pressure 92/62. On Midodrine , which increases heart rate. Dehydration may contribute to hypotension. Increasing Midodrine  risks tachycardia. - Monitor blood pressure. - Advise against increasing Midodrine . - Encourage increased fluid intake.  Electrolyte Imbalance Low magnesium  and calcium  likely related to surgery and wound healing. Unable to take supplements due to nausea. - Resend prescription for magnesium  and calcium . Patient has not  started this medication  Rib Pain left side Pain under breast possibly due to fall. Concerns about rib fracture or contusion. X-ray ordered. - Order x-ray with rib detail.  Left Arm Pain Tenderness and pain following fall. Concerns about fracture. X-ray ordered. - Order x-ray of the arm.  Anxiety Increased anxiety possibly related to health issues and pain. Considering switch from Xanax  to Klonopin . - Prescribe Klonopin  instead of Xanax .   Hyperlipidemia Requires refill of Crestor . Currently taking spouse's 20 mg tablets due to running out. - Refill prescription for Crestor  10  mg.  COPD On 4 liters of oxygen. Recently quit smoking but resumed. Encouraged smoking cessation. - Encourage smoking cessation. - Continue oxygen therapy.  Follow-up Multiple ongoing health issues. Discussion about potential  reversal in six months. Home health care provides wound care 3x per week - Follow up with surgeon in six months - patient not sure if she will do a reversal. She may decide to go with a colostomy bag - Coordinate with home health care for ongoing wound care and monitoring.    The 10-year ASCVD risk score (Arnett DK, et al., 2019) is: 1.1%  Past Medical History:  Diagnosis Date   Allergy    Anxiety    Arthritis    Asthma    CAP (community acquired pneumonia) 04/07/2019   Carpal tunnel syndrome    Cataract    COPD (chronic obstructive pulmonary disease) (HCC)    Depression    Diverticulitis    Emphysema of lung (HCC)    Essential hypertension, benign 07/14/2019   Hyperlipidemia    Oxygen deficiency    Oxygen dependent    on 5 L cont [place]   Past Surgical History:  Procedure Laterality Date   ABDOMINAL HYSTERECTOMY  2009   APPENDECTOMY     ARTHROSCOPIC REPAIR ACL Left 2011/2014   x 2   BIOPSY  01/21/2020   Procedure: BIOPSY;  Surgeon: Shila Gustav GAILS, MD;  Location: [facility];  Service: Endoscopy;;  EGD and COLON   BUNIONECTOMY  2006   both   CARPAL TUNNEL RELEASE Bilateral 10/28/2013   Procedure: BILATERAL CARPAL TUNNEL RELEASE;  Surgeon: Arley JONELLE Curia, MD;  Location: [facility];  Service: Orthopedics;  Laterality: Bilateral;   CHOLECYSTECTOMY  1994   CHONDROPLASTY Right 08/31/2020   Procedure: CHONDROPLASTY;  Surgeon: Shari Sieving, MD;  Location: [facility];  Service: Orthopedics;  Laterality: Right;   COLONOSCOPY WITH PROPOFOL  N/A 01/21/2020   Procedure: COLONOSCOPY WITH PROPOFOL ;  Surgeon: Shila Gustav GAILS, MD;  Location: [facility];  Service: Endoscopy;  Laterality: N/A;   DILATION AND CURETTAGE OF UTERUS      ESOPHAGOGASTRODUODENOSCOPY (EGD) WITH PROPOFOL  N/A 01/21/2020   Procedure: ESOPHAGOGASTRODUODENOSCOPY (EGD) WITH PROPOFOL ;  Surgeon: Shila Gustav GAILS, MD;  Location: [facility];  Service: Endoscopy;  Laterality: N/A;   KNEE ARTHROSCOPY WITH ANTERIOR CRUCIATE LIGAMENT (ACL) REPAIR Right 08/31/2020   Procedure: RIGHT KNEE ARTHROSCOPY WITH ANTERIOR CRUCIATE LIGAMENT (ACL) REPAIR MEDIAL AND LATERAL  MENISECTOMY;  Surgeon: Shari Sieving, MD;  Location: [facility];  Service: Orthopedics;  Laterality: Right;  FEMORAL NERVE BLOCK   LAPAROTOMY N/A 10/10/2023   Procedure: EXPLORATORY LAPAROTOMY, PARTIAL COLECTOMY AND COLOSTOMY;  Surgeon: Teresa Lonni HERO, MD;  Location: [facility];  Service: General;  Laterality: N/A;  EXPLORATORY LAPAROTOMY , PARTIAL COLECTOMY AND COLOSTOMY   POLYPECTOMY  01/21/2020   Procedure: POLYPECTOMY;  Surgeon: Shila Gustav GAILS, MD;  Location: [facility];  Service: Endoscopy;;   TUBAL LIGATION  2004   Social History   Tobacco Use   Smoking status: Former    Current packs/day: 1.00    Average packs/day: 1 pack/day for 34.0 years (34.0 ttl pk-yrs)    Types: Cigarettes   Smokeless tobacco: Never   Tobacco comments:    6 cigarettes a day   Vaping Use   Vaping status: Never Used  Substance Use Topics   Alcohol use: Yes    Comment: everyday   Drug use: No   Family History  Problem Relation Age of Onset   Brain cancer Mother 52   Lung cancer Mother 72   Breast cancer Sister 36   Ovarian cancer Sister 67   Colon cancer Neg Hx    Colon polyps Neg Hx    Esophageal cancer Neg Hx    Rectal cancer Neg Hx    Stomach cancer Neg Hx    Allergies  Allergen Reactions   Jasmine Oil Shortness Of Breath and Swelling   Levaquin  [Levofloxacin ] Shortness Of Breath, Itching, Swelling and Rash   Penicillins Anaphylaxis and Hives    Tolerated ceftriaxone    Aleve [Naproxen Sodium] Itching   Chlorhexidine  Other (See Comments)    Unknown reaction   Depo-Provera  [Medroxyprogesterone Acetate] Other (See Comments)    Severe acne   Doxycycline  Other (See Comments)    Raises blood pressure    Tramadol Other (See Comments)    Kept her up all night    ROS    Objective:    BP 92/62   Pulse (!) 112   Temp 98.5 F (36.9 C)   Ht 5' 3 (1.6 m)   Wt 148 lb (67.1 kg) Comment: Stated by patient  SpO2 92% Comment: 4 liters  BMI 26.22 kg/m  BP Readings from Last 3 Encounters:  11/22/23 92/62  11/14/23 107/70  11/06/23 112/70   Wt Readings from Last 3 Encounters:  11/22/23 148 lb (67.1 kg)  11/14/23 148 lb (67.1 kg)  11/06/23 198 lb 6.6 oz (90 kg)    Physical Exam Vitals and nursing note reviewed.  Constitutional:      General: She is not in acute distress.    Appearance: Normal appearance.     Comments: Patient in a wheel chair, comes in with her husband. Has oxygen on (4L)  HENT:     Head: Normocephalic.     Right Ear: Tympanic membrane, ear canal and external ear normal.     Left Ear: Tympanic membrane, ear canal and external ear normal.  Eyes:     Extraocular Movements: Extraocular movements intact.     Pupils: Pupils are equal, round, and reactive to light.  Cardiovascular:     Rate and Rhythm: Normal rate and regular rhythm.     Heart sounds: Normal heart sounds.  Pulmonary:     Effort: Pulmonary effort is normal.     Breath sounds: Normal breath sounds.  Abdominal:     Tenderness: There is no abdominal tenderness.  Musculoskeletal:     Left upper arm: Tenderness and bony tenderness present.     Right lower leg: No edema.     Left lower leg: No edema.     Comments: Left upper arm- bruising noted. Patient is able to lift arm above shoulder level.  Rib cage - patient has tenderness over left rib cage under breast.   Neurological:     General: No focal deficit present.     Mental Status: She is alert and oriented to person, place, and time.  Psychiatric:        Mood and Affect: Mood normal.        Behavior: Behavior normal.       No results found for any visits on 11/22/23.

## 2023-11-22 NOTE — Telephone Encounter (Signed)
 FYI Only or Action Required?: FYI only for provider.  Patient was last seen in primary care on 11/14/2023 by Kayla Jeoffrey RAMAN, FNP.  Called Nurse Triage reporting Fall.  Symptoms began yesterday.  Interventions attempted: Nothing.  Symptoms are: dizziness, fall,nausea, vomiting, left rib pain, left bicep bruie stable.  Triage Disposition: Call PCP Now- called [facility] and notified of additional symptoms for patient's OV today  Patient/caregiver understands and will follow disposition?:        Copied from CRM [id number]. Topic: Clinical - Red Word Triage >> Nov 22, 2023  2:07 PM Antwanette L wrote: Red Word that prompted transfer to Nurse Triage: Patient had fell last night and think she broke some ribs. Got dizzy and ran into a fan Reason for Disposition  [1] Caller has URGENT question AND [2] triager unable to answer question  Answer Assessment - Initial Assessment Questions Toribio states he visited patient today for wound care and she was on her way for office visit. He states her BP was around 92/80 and noted she is on midodrine  he would like to address if she had the fall possibly due to low blood pressure.  1. MECHANISM: How did the fall happen?     Patient got up last night, was walking at the end of her bed. She got dizzy and fell, hitting her left side on an oscillating fan.  2. DOMESTIC VIOLENCE AND ELDER ABUSE SCREENING: Did you fall because someone pushed you or tried to hurt you? If Yes, ask: Are you safe now?     No.  3. ONSET: When did the fall happen? (e.g., minutes, hours, or days ago)     Last night.  4. LOCATION: What part of the body hit the ground? (e.g., back, buttocks, head, hips, knees, hands, head, stomach)     Left side of body.  5. INJURY: Did you hurt (injure) yourself when you fell? If Yes, ask: What did you injure? Tell me more about this? (e.g., body area; type of injury; pain severity)     Left rib pain, left bicep bruise size of about a  baseball.  6. PAIN: Is there any pain? If Yes, ask: How bad is the pain? (e.g., Scale 0-10; or none, mild,      Yes, left rib pain. He states he was concerned she may have broken her ribs. He states she was not in any obvious distress and no labored breathing. Pain worsened with movement.  7. SIZE: For cuts, bruises, or swelling, ask: How large is it? (e.g., inches or centimeters)      Bruise on left bicep is size of about a baseball.  8. PREGNANCY: Is there any chance you are pregnant? When was your last menstrual period?     N/A.  9. OTHER SYMPTOMS: Do you have any other symptoms? (e.g., dizziness, fever, weakness; new-onset or worsening).      Nausea, dizziness. Toribio denies any fever, weakness, stroke like symptoms when he observed patient today.  10. CAUSE: What do you think caused the fall (or falling)? (e.g., dizzy spell, tripped)       Dizziness. He states she normally gets the dizziness.  Protocols used: Falls and [facility]

## 2023-11-24 ENCOUNTER — Other Ambulatory Visit: Payer: Self-pay | Admitting: Family Medicine

## 2023-11-24 DIAGNOSIS — K219 Gastro-esophageal reflux disease without esophagitis: Secondary | ICD-10-CM

## 2023-11-25 ENCOUNTER — Telehealth: Payer: Self-pay

## 2023-11-25 DIAGNOSIS — Z4801 Encounter for change or removal of surgical wound dressing: Secondary | ICD-10-CM | POA: Diagnosis not present

## 2023-11-25 DIAGNOSIS — F1020 Alcohol dependence, uncomplicated: Secondary | ICD-10-CM | POA: Diagnosis not present

## 2023-11-25 DIAGNOSIS — A419 Sepsis, unspecified organism: Secondary | ICD-10-CM | POA: Diagnosis not present

## 2023-11-25 DIAGNOSIS — R6520 Severe sepsis without septic shock: Secondary | ICD-10-CM | POA: Diagnosis not present

## 2023-11-25 DIAGNOSIS — J4489 Other specified chronic obstructive pulmonary disease: Secondary | ICD-10-CM | POA: Diagnosis not present

## 2023-11-25 DIAGNOSIS — J9611 Chronic respiratory failure with hypoxia: Secondary | ICD-10-CM | POA: Diagnosis not present

## 2023-11-25 DIAGNOSIS — Z433 Encounter for attention to colostomy: Secondary | ICD-10-CM | POA: Diagnosis not present

## 2023-11-25 DIAGNOSIS — J439 Emphysema, unspecified: Secondary | ICD-10-CM | POA: Diagnosis not present

## 2023-11-25 DIAGNOSIS — Z48815 Encounter for surgical aftercare following surgery on the digestive system: Secondary | ICD-10-CM | POA: Diagnosis not present

## 2023-11-25 NOTE — Telephone Encounter (Signed)
 Copied from CRM [id number]. Topic: Clinical - Prescription Issue >> Nov 25, 2023 12:49 PM DeAngela L wrote: Reason for CRM: pt called and states the prescription kept her up all night long and she had to use restroom every 15 - 20 minutes  clonazePAM  (KLONOPIN ) 0.5 MG tablet  Pt would like to ask if she could be switched back to xanax  or something her body can tollerate better - ALPRAZolam  (XANAX ) 1 MG tablet Pt num [phone] (M)

## 2023-11-26 ENCOUNTER — Other Ambulatory Visit: Payer: Self-pay

## 2023-11-26 DIAGNOSIS — F419 Anxiety disorder, unspecified: Secondary | ICD-10-CM

## 2023-11-26 MED ORDER — LORAZEPAM 0.5 MG PO TABS: 0.5000 mg | ORAL_TABLET | Freq: Two times a day (BID) | ORAL | 0 refills | Status: DC | PRN

## 2023-11-27 ENCOUNTER — Emergency Department (HOSPITAL_COMMUNITY)
Admission: EM | Admit: 2023-11-27 | Discharge: 2023-11-27 | Disposition: A | Discharge status: Home / Self Care | Attending: Emergency Medicine | Admitting: Emergency Medicine

## 2023-11-27 ENCOUNTER — Other Ambulatory Visit: Payer: Self-pay

## 2023-11-27 ENCOUNTER — Encounter (HOSPITAL_COMMUNITY): Payer: Self-pay | Admitting: Pharmacy Technician

## 2023-11-27 ENCOUNTER — Emergency Department (HOSPITAL_COMMUNITY)

## 2023-11-27 DIAGNOSIS — W19XXXA Unspecified fall, initial encounter: Secondary | ICD-10-CM | POA: Diagnosis not present

## 2023-11-27 DIAGNOSIS — A419 Sepsis, unspecified organism: Secondary | ICD-10-CM | POA: Diagnosis not present

## 2023-11-27 DIAGNOSIS — S2243XA Multiple fractures of ribs, bilateral, initial encounter for closed fracture: Secondary | ICD-10-CM | POA: Diagnosis not present

## 2023-11-27 DIAGNOSIS — I959 Hypotension, unspecified: Secondary | ICD-10-CM | POA: Diagnosis not present

## 2023-11-27 DIAGNOSIS — Z4801 Encounter for change or removal of surgical wound dressing: Secondary | ICD-10-CM | POA: Diagnosis not present

## 2023-11-27 DIAGNOSIS — J439 Emphysema, unspecified: Secondary | ICD-10-CM | POA: Diagnosis not present

## 2023-11-27 DIAGNOSIS — R911 Solitary pulmonary nodule: Secondary | ICD-10-CM | POA: Diagnosis not present

## 2023-11-27 DIAGNOSIS — Z433 Encounter for attention to colostomy: Secondary | ICD-10-CM | POA: Diagnosis not present

## 2023-11-27 DIAGNOSIS — S299XXA Unspecified injury of thorax, initial encounter: Secondary | ICD-10-CM | POA: Diagnosis present

## 2023-11-27 DIAGNOSIS — J4489 Other specified chronic obstructive pulmonary disease: Secondary | ICD-10-CM | POA: Diagnosis not present

## 2023-11-27 DIAGNOSIS — J9611 Chronic respiratory failure with hypoxia: Secondary | ICD-10-CM | POA: Diagnosis not present

## 2023-11-27 DIAGNOSIS — R079 Chest pain, unspecified: Secondary | ICD-10-CM | POA: Diagnosis not present

## 2023-11-27 DIAGNOSIS — R6520 Severe sepsis without septic shock: Secondary | ICD-10-CM | POA: Diagnosis not present

## 2023-11-27 DIAGNOSIS — R0789 Other chest pain: Secondary | ICD-10-CM | POA: Diagnosis not present

## 2023-11-27 DIAGNOSIS — S2232XA Fracture of one rib, left side, initial encounter for closed fracture: Secondary | ICD-10-CM | POA: Insufficient documentation

## 2023-11-27 DIAGNOSIS — R000 Tachycardia, unspecified: Secondary | ICD-10-CM | POA: Diagnosis not present

## 2023-11-27 DIAGNOSIS — Z48815 Encounter for surgical aftercare following surgery on the digestive system: Secondary | ICD-10-CM | POA: Diagnosis not present

## 2023-11-27 DIAGNOSIS — F1020 Alcohol dependence, uncomplicated: Secondary | ICD-10-CM | POA: Diagnosis not present

## 2023-11-27 DIAGNOSIS — R1111 Vomiting without nausea: Secondary | ICD-10-CM | POA: Diagnosis not present

## 2023-11-27 LAB — COMPREHENSIVE METABOLIC PANEL WITH GFR
AG Ratio: 0.9 (calc) — ABNORMAL LOW (ref 1.0–2.5)
ALT: 14 U/L (ref 6–29)
ALT: 16 U/L (ref 0–44)
AST: 53 U/L — ABNORMAL HIGH (ref 10–35)
AST: 84 U/L — ABNORMAL HIGH (ref 15–41)
Albumin: 3.4 g/dL — ABNORMAL LOW (ref 3.6–5.1)
Albumin: 2.9 g/dL — ABNORMAL LOW (ref 3.5–5.0)
Alkaline Phosphatase: 152 U/L — ABNORMAL HIGH (ref 38–126)
Alkaline phosphatase (APISO): 173 U/L — ABNORMAL HIGH (ref 37–153)
Anion gap: 12 (ref 5–15)
BUN: 7 mg/dL (ref 7–25)
BUN: <5 mg/dL — ABNORMAL LOW (ref 6–20)
CO2: 26 mmol/L (ref 20–32)
CO2: 32 mmol/L (ref 22–32)
Calcium: 8.7 mg/dL (ref 8.6–10.4)
Calcium: 9 mg/dL (ref 8.9–10.3)
Chloride: 100 mmol/L (ref 98–110)
Chloride: 94 mmol/L — ABNORMAL LOW (ref 98–111)
Creat: 0.6 mg/dL (ref 0.50–1.03)
Creatinine, Ser: 0.55 mg/dL (ref 0.44–1.00)
GFR, Estimated: >60 mL/min (ref >60)
Globulin: 3.7 g/dL (ref 1.9–3.7)
Glucose, Bld: 107 mg/dL — ABNORMAL HIGH (ref 65–99)
Glucose, Bld: 104 mg/dL — ABNORMAL HIGH (ref 70–99)
Potassium: 4.4 mmol/L (ref 3.5–5.3)
Potassium: 3.8 mmol/L (ref 3.5–5.1)
Sodium: 138 mmol/L (ref 135–146)
Sodium: 138 mmol/L (ref 135–145)
Total Bilirubin: 0.6 mg/dL (ref 0.2–1.2)
Total Bilirubin: 1.1 mg/dL (ref 0.0–1.2)
Total Protein: 7.1 g/dL (ref 6.1–8.1)
Total Protein: 7.1 g/dL (ref 6.5–8.1)
eGFR: 105 mL/min/1.73m2 (ref >=60)

## 2023-11-27 LAB — CBC WITH DIFFERENTIAL/PLATELET
Abs Immature Granulocytes: 0.02 K/uL (ref 0.00–0.07)
Absolute Lymphocytes: 2851 {cells}/uL (ref 850–3900)
Absolute Monocytes: 889 {cells}/uL (ref 200–950)
Basophils Absolute: 44 {cells}/uL (ref 0–200)
Basophils Absolute: 0.1 K/uL (ref 0.0–0.1)
Basophils Relative: 0.5 %
Basophils Relative: 1 %
Eosinophils Absolute: 282 {cells}/uL (ref 15–500)
Eosinophils Absolute: 0.1 K/uL (ref 0.0–0.5)
Eosinophils Relative: 3.2 %
Eosinophils Relative: 2 %
HCT: 32.8 % — ABNORMAL LOW (ref 35.0–45.0)
HCT: 30.8 % — ABNORMAL LOW (ref 36.0–46.0)
Hemoglobin: 10.2 g/dL — ABNORMAL LOW (ref 11.7–15.5)
Hemoglobin: 10.3 g/dL — ABNORMAL LOW (ref 12.0–15.0)
Immature Granulocytes: 0 %
Lymphocytes Relative: 19 %
Lymphs Abs: 1.6 K/uL (ref 0.7–4.0)
MCH: 30.6 pg (ref 27.0–33.0)
MCH: 30.7 pg (ref 26.0–34.0)
MCHC: 31.1 g/dL — ABNORMAL LOW (ref 32.0–36.0)
MCHC: 33.4 g/dL (ref 30.0–36.0)
MCV: 98.5 fL (ref 80.0–100.0)
MCV: 91.7 fL (ref 80.0–100.0)
MPV: 10.2 fL (ref 7.5–12.5)
Monocytes Absolute: 0.9 K/uL (ref 0.1–1.0)
Monocytes Relative: 10.1 %
Monocytes Relative: 11 %
Neutro Abs: 4734 {cells}/uL (ref 1500–7800)
Neutro Abs: 5.7 K/uL (ref 1.7–7.7)
Neutrophils Relative %: 53.8 %
Neutrophils Relative %: 67 %
Platelets: 333 Thousand/uL (ref 140–400)
Platelets: 265 K/uL (ref 150–400)
RBC: 3.33 Million/uL — ABNORMAL LOW (ref 3.80–5.10)
RBC: 3.36 MIL/uL — ABNORMAL LOW (ref 3.87–5.11)
RDW: 15 % (ref 11.0–15.0)
RDW: 16.7 % — ABNORMAL HIGH (ref 11.5–15.5)
Total Lymphocyte: 32.4 %
WBC: 8.8 Thousand/uL (ref 3.8–10.8)
WBC: 8.4 K/uL (ref 4.0–10.5)
nRBC: 0 % (ref 0.0–0.2)

## 2023-11-27 LAB — TEST AUTHORIZATION

## 2023-11-27 LAB — LIPASE: Lipase: 21 U/L (ref 7–60)

## 2023-11-27 LAB — MAGNESIUM: Magnesium: 1 mg/dL — CL (ref 1.5–2.5)

## 2023-11-27 MED ORDER — SODIUM CHLORIDE 0.9 % IV SOLN: INTRAVENOUS | Status: DC

## 2023-11-27 MED ORDER — OXYCODONE HCL 5 MG PO TABS: 5.0000 mg | ORAL_TABLET | ORAL | 0 refills | Status: AC | PRN

## 2023-11-27 MED ORDER — OXYCODONE-ACETAMINOPHEN 5-325 MG PO TABS
2.0000 | ORAL_TABLET | Freq: Once | ORAL | Status: AC
  Administered 2023-11-27: 2 via ORAL
  Filled 2023-11-27: qty 2

## 2023-11-27 MED ORDER — OXYCODONE HCL 5 MG PO TABS
5.0000 mg | ORAL_TABLET | Freq: Once | ORAL | Status: AC
  Administered 2023-11-27: 5 mg via ORAL
  Filled 2023-11-27: qty 1

## 2023-11-27 NOTE — ED Provider Notes (Signed)
 [place] [facility] AT [facility] Provider Note   CSN: [id number] Arrival date & time: 11/27/23  1543     Patient presents with: Chest Pain   Vicki [name] is a 58 y.o. female.    Chest Pain  58 year old female here today with left-sided chest pain that she says has been ongoing since she had a fall on the 10th.  Patient saw her PCP, had negative plain films.  She has been having worsening pain over the left side of her chest.  Patient has a history of COPD, is on 4 L of oxygen chronically.    Prior to Admission medications   Medication Sig Start Date End Date Taking? Authorizing Provider  LORazepam  (ATIVAN ) 0.5 MG tablet Take 1 tablet (0.5 mg total) by mouth 2 (two) times daily as needed for anxiety. 11/26/23   Aletha Bene, MD  oxyCODONE  (ROXICODONE ) 5 MG immediate release tablet Take 1 tablet (5 mg total) by mouth every 4 (four) hours as needed for severe pain (pain score 7-10). 11/27/23  Yes Mannie Pac T, DO  acetaminophen  (TYLENOL ) 500 MG tablet Take 1 tablet (500 mg total) by mouth every 8 (eight) hours as needed for moderate pain (pain score 4-6) or mild pain (pain score 1-3). 10/19/23   Dennise Lavada POUR, MD  albuterol  (VENTOLIN  HFA) 108 (90 Base) MCG/ACT inhaler INHALE 2 PUFFS INTO THE LUNGS TWICE A DAY 07/15/23   Kayla Gauze S, FNP  ASPIRIN  LOW DOSE 81 MG EC tablet TAKE 1 TABLET BY MOUTH 2 TIMES DAILY. 03/21/21   Kip Ade, NP  calcium  carbonate (TUMS) 500 MG chewable tablet Chew 1 tablet (200 mg of elemental calcium  total) by mouth daily. 11/22/23   Aletha Bene, MD  citalopram  (CELEXA ) 40 MG tablet TAKE 1 TABLET BY MOUTH EVERY DAY 07/31/23   Kayla Gauze RAMAN, FNP  EPINEPHrine  (EPIPEN  2-PAK) 0.3 mg/0.3 mL IJ SOAJ injection Inject 0.3 mg into the muscle as needed for anaphylaxis. 12/27/20   Kip Ade, NP  Fluticasone -Umeclidin-Vilant (TRELEGY ELLIPTA ) 200-62.5-25 MCG/ACT AEPB TAKE 1 PUFF BY MOUTH EVERY DAY 02/22/23   Olalere,  Jennet A, MD  folic acid  (FOLVITE ) 1 MG tablet Take 1 tablet (1 mg total) by mouth daily. 11/14/23   Kayla Gauze RAMAN, FNP  Magnesium  Chloride 64 MG TABS Take 2 tablets by mouth in the morning, at noon, and at bedtime. 11/22/23   Aletha Bene, MD  midodrine  (PROAMATINE ) 5 MG tablet Take 1 tablet (5 mg total) by mouth 2 (two) times daily with a meal. 11/14/23   Kayla Gauze RAMAN, FNP  omeprazole  (PRILOSEC) 40 MG capsule Take 1 capsule (40 mg total) by mouth 2 (two) times daily. Take 30 mins. Prior to breakfast and supper 03/04/23   Beather Delon Gibson, PA  ondansetron  (ZOFRAN ) 4 MG tablet Take 1 tablet (4 mg total) by mouth every 8 (eight) hours as needed for nausea or vomiting. Patient not taking: Reported on 11/14/2023 10/19/23   Singh, Prashant K, MD  ondansetron  (ZOFRAN ) 8 MG tablet Take 1 tablet (8 mg total) by mouth every 8 (eight) hours as needed for nausea or vomiting. 11/22/23   Aletha Bene, MD  potassium chloride  SA (KLOR-CON  M) 20 MEQ tablet Take 1 tablet (20 mEq total) by mouth daily. 10/10/21   Kip Ade, NP  promethazine  (PHENERGAN ) 25 MG tablet Take 1 tablet (25 mg total) by mouth every 8 (eight) hours as needed for nausea or vomiting. 11/22/23   Aletha Bene, MD  rizatriptan  (MAXALT ) 10  MG tablet TAKE 1 TABLET BY MOUTH AS NEEDED FOR MIGRAINE. MAY REPEAT IN 2 HOURS IF NEEDED STRENGTH: 10 MG 09/26/23   Kayla Jeoffrey RAMAN, FNP  rOPINIRole  (REQUIP ) 2 MG tablet TAKE 1/2 TO 1 TABLETS BY MOUTH AT BEDTIME. 02/25/23   Kayla Jeoffrey RAMAN, FNP  rosuvastatin  (CRESTOR ) 10 MG tablet Take 1 tablet (10 mg total) by mouth daily. 11/22/23   Aletha Bene, MD  thiamine  (VITAMIN B1) 100 MG tablet Take 1 tablet (100 mg total) by mouth daily. 11/14/23   Kayla Jeoffrey RAMAN, FNP  triamcinolone  ointment (KENALOG ) 0.5 % Apply topically to your back 2 (two) times daily. 10/19/23   Singh, Prashant K, MD    Allergies: Jasmine oil, Levaquin  [levofloxacin ], Penicillins, Klonopin  [clonazepam ], Aleve [naproxen sodium],  Chlorhexidine , Depo-provera [medroxyprogesterone acetate], Doxycycline , and Tramadol    Review of Systems  Cardiovascular:  Positive for chest pain.    Updated Vital Signs BP 106/73 (BP Location: Right Arm)   Pulse 82   Temp 97.6 F (36.4 C) (Oral)   Resp 15   Ht 5' 3 (1.6 m)   Wt 67.1 kg   SpO2 100%   BMI 26.22 kg/m   Physical Exam Vitals reviewed.  Cardiovascular:     Rate and Rhythm: Normal rate.     Heart sounds: Normal heart sounds.  Pulmonary:     Effort: Pulmonary effort is normal. No tachypnea.     Breath sounds: Normal breath sounds. No decreased breath sounds or wheezing.  Chest:     Chest wall: Tenderness present.  Abdominal:     Comments: Ostomy bag with draining stool  Skin:    General: Skin is warm.  Neurological:     Mental Status: She is alert.     (all labs ordered are listed, but only abnormal results are displayed) Labs Reviewed  CBC WITH DIFFERENTIAL/PLATELET - Abnormal; Notable for the following components:      Result Value   RBC 3.36 (*)    Hemoglobin 10.3 (*)    HCT 30.8 (*)    RDW 16.7 (*)    All other components within normal limits  COMPREHENSIVE METABOLIC PANEL WITH GFR - Abnormal; Notable for the following components:   Chloride 94 (*)    Glucose, Bld 104 (*)    BUN <5 (*)    Albumin  2.9 (*)    AST 84 (*)    Alkaline Phosphatase 152 (*)    All other components within normal limits    EKG: EKG Interpretation Date/Time:  Wednesday November 27 2023 15:51:32 EDT Ventricular Rate:  97 PR Interval:  162 QRS Duration:  62 QT Interval:  374 QTC Calculation: 474 R Axis:   66  Text Interpretation: Normal sinus rhythm Low voltage QRS Septal infarct , age undetermined Abnormal ECG When compared with ECG of 08-Oct-2023 17:39, PREVIOUS ECG IS PRESENT Confirmed by Dasie Faden ([id number]) on 11/27/2023 4:37:21 PM  Radiology: CT Chest Wo Contrast Result Date: 11/27/2023 CLINICAL DATA:  History of fall 1 week ago with negative rib series and  increasing chest pain, initial encounter EXAM: CT CHEST WITHOUT CONTRAST TECHNIQUE: Multidetector CT imaging of the chest was performed following the standard protocol without IV contrast. RADIATION DOSE REDUCTION: This exam was performed according to the departmental dose-optimization program which includes automated exposure control, adjustment of the mA and/or kV according to patient size and/or use of iterative reconstruction technique. COMPARISON:  Rib series from earlier in the same day as well as 11/22/2023. FINDINGS: Cardiovascular: Somewhat limited due to  lack of IV contrast. No aneurysmal dilatation of the aorta is seen. Mild atherosclerotic calcifications of the aorta are seen. No cardiac enlargement is noted. Mediastinum/Nodes: Thoracic inlet is within normal limits. No hilar or mediastinal adenopathy is noted. The esophagus as visualized is within normal limits. Lungs/Pleura: Lungs are well aerated bilaterally and demonstrate diffuse emphysematous changes. No focal infiltrate or sizable effusion is seen. Mild scarring in the bases is noted bilaterally. There is a small somewhat spiculated nodule identified right upper lobe anteriorly measuring 6 mm. This is best visualized on image number 88 of series 7. No other nodule is noted. Upper Abdomen: No acute abnormality. Musculoskeletal: Minimally displaced fracture of the anterior aspect of the left sixth rib is seen. Multiple healed bilateral rib fractures are noted. IMPRESSION: Anterior left sixth rib fracture without complicating factors. Suspicious right solid pulmonary nodule within the upper lobe measuring 6 mm. Per Fleischner Society Guidelines, recommend a non-contrast Chest CT at 6-12 months. If patient is high risk for malignancy, consider an additional non-contrast Chest CT at 18-24 months. If patient is low risk for malignancy, non-contrast Chest CT at 18-24 months is optional. These guidelines do not apply to immunocompromised patients and  patients with cancer. Follow up in patients with significant comorbidities as clinically warranted. For lung cancer screening, adhere to Lung-RADS guidelines. Reference: Radiology. 2017; 284(1):228-43. Aortic Atherosclerosis (ICD10-I70.0) and Emphysema (ICD10-J43.9). Electronically Signed   By: Oneil Devonshire M.D.   On: 11/27/2023 21:18   DG Ribs Unilateral W/Chest Left Result Date: 11/27/2023 CLINICAL DATA:  Sudden onset chest pain 2 hours prior to admission. Fell 1 week ago. EXAM: LEFT RIBS AND CHEST - 3+ VIEW COMPARISON:  11/22/2023 FINDINGS: Normal-sized heart. Clear lungs with normal vascularity. Previously described old, healed left lateral 3rd and 7th rib fractures. No acute fracture or pneumothorax seen. IMPRESSION: No acute abnormality. Electronically Signed   By: Elspeth Bathe M.D.   On: 11/27/2023 19:06     Procedures   Medications Ordered in the ED  0.9 %  sodium chloride  infusion ( Intravenous New Bag/Given 11/27/23 2020)  oxyCODONE -acetaminophen  (PERCOCET/ROXICET) 5-325 MG per tablet 2 tablet (2 tablets Oral Given 11/27/23 1702)  oxyCODONE  (Oxy IR/ROXICODONE ) immediate release tablet 5 mg (5 mg Oral Given 11/27/23 2018)                                    Medical Decision Making Patient with pain over the left side of the chest wall.  Differential diagnoses include rib fracture, chest wall contusion, less likely pneumothorax, less likely ACS, less likely aortic injury, less likely PE.  Plan-patient with reproducible pain over the chest wall.  She has not taken Tylenol  at home.  Continues have some pain.  Her plain films are negative, will obtain CT imaging to assess for fracture.  Patient at her baseline O2.  Symptoms not cardiac in nature.  Do not believe troponin is required.  My independent review of the patient's EKG shows no ST segment depressions or elevations, no T wave inversions, no evidence of acute ischemia.  Reassessment 9:20 PM-patient CT imaging of the chest shows an  acute left rib fracture.  Will discharge patient with analgesia.  Incidental pulmonary nodule found, discussed with patient.  Amount and/or Complexity of Data Reviewed Radiology: ordered.  Risk Prescription drug management.        Final diagnoses:  Closed fracture of one rib of left side, initial encounter  ED Discharge Orders          Ordered    oxyCODONE  (ROXICODONE ) 5 MG immediate release tablet  Every 4 hours PRN        11/27/23 2127               Mannie Pac T, DO 11/27/23 2127

## 2023-11-27 NOTE — ED Provider Triage Note (Signed)
 Emergency Medicine Provider Triage Evaluation Note  Gissela Bloch , a 58 y.o. female  was evaluated in triage.  Pt complains of left-sided chest pain after a fall about a week ago.  Patient had negative x-rays.  Pain is sharp and worse with movement.  Review of Systems  Positive: Shortness of breath Negative: No hemoptysis  Physical Exam  BP (!) 124/91 (BP Location: Left Arm)   Pulse (!) 103   Temp 98.8 F (37.1 C) (Oral)   Resp 18   SpO2 97%  Gen:   Awake, mild distress Resp:  Normal effort left anterior chest wall pain without crepitus  MSK:   Moves extremities without difficulty  Other:    Medical Decision Making  Medically screening exam initiated at 4:43 PM.  Appropriate orders placed.  Jinan Biggins was informed that the remainder of the evaluation will be completed by another provider, this initial triage assessment does not replace that evaluation, and the importance of remaining in the ED until their evaluation is complete.     Dasie Faden, MD 11/27/23 [phone]

## 2023-11-27 NOTE — ED Triage Notes (Signed)
 Pt bib ems with sudden onset chest pain approx 2 hours pta. Pt with hx COPD, on 4L [place] at all times. Ems noted ronchi bilaterally. Pt endorses feeling lightheaded with nausea and vomiting today as well. When pt sat up HR went up to 150. Given 500cc NS and 4mg  Zofran  pta. Pt took aspirin  prior to EMS arrival.

## 2023-11-27 NOTE — ED Notes (Signed)
 Pt transported to xray

## 2023-11-27 NOTE — Discharge Instructions (Addendum)
 You have a fracture of your sixth rib on your left side.  This will heal over the next couple of weeks.  You can take 5 mg of oxycodone  every 6 hours.  You can take Motrin  and Tylenol .  Please follow-up with your primary care doctor.

## 2023-11-29 ENCOUNTER — Other Ambulatory Visit: Payer: Self-pay

## 2023-11-29 DIAGNOSIS — E612 Magnesium deficiency: Secondary | ICD-10-CM

## 2023-11-29 NOTE — Progress Notes (Signed)
 Called to give information and to schedule, no contact made. Lvm for call back as well as sent result letter w/ medication changes and order for lab work in one week.

## 2023-12-02 DIAGNOSIS — R6520 Severe sepsis without septic shock: Secondary | ICD-10-CM | POA: Diagnosis not present

## 2023-12-02 DIAGNOSIS — J439 Emphysema, unspecified: Secondary | ICD-10-CM | POA: Diagnosis not present

## 2023-12-02 DIAGNOSIS — J9611 Chronic respiratory failure with hypoxia: Secondary | ICD-10-CM | POA: Diagnosis not present

## 2023-12-02 DIAGNOSIS — F1020 Alcohol dependence, uncomplicated: Secondary | ICD-10-CM | POA: Diagnosis not present

## 2023-12-02 DIAGNOSIS — Z4801 Encounter for change or removal of surgical wound dressing: Secondary | ICD-10-CM | POA: Diagnosis not present

## 2023-12-02 DIAGNOSIS — A419 Sepsis, unspecified organism: Secondary | ICD-10-CM | POA: Diagnosis not present

## 2023-12-02 DIAGNOSIS — Z433 Encounter for attention to colostomy: Secondary | ICD-10-CM | POA: Diagnosis not present

## 2023-12-02 DIAGNOSIS — Z48815 Encounter for surgical aftercare following surgery on the digestive system: Secondary | ICD-10-CM | POA: Diagnosis not present

## 2023-12-02 DIAGNOSIS — J4489 Other specified chronic obstructive pulmonary disease: Secondary | ICD-10-CM | POA: Diagnosis not present

## 2023-12-04 DIAGNOSIS — F1020 Alcohol dependence, uncomplicated: Secondary | ICD-10-CM | POA: Diagnosis not present

## 2023-12-04 DIAGNOSIS — J439 Emphysema, unspecified: Secondary | ICD-10-CM | POA: Diagnosis not present

## 2023-12-04 DIAGNOSIS — R6520 Severe sepsis without septic shock: Secondary | ICD-10-CM | POA: Diagnosis not present

## 2023-12-04 DIAGNOSIS — J4489 Other specified chronic obstructive pulmonary disease: Secondary | ICD-10-CM | POA: Diagnosis not present

## 2023-12-04 DIAGNOSIS — Z4801 Encounter for change or removal of surgical wound dressing: Secondary | ICD-10-CM | POA: Diagnosis not present

## 2023-12-04 DIAGNOSIS — A419 Sepsis, unspecified organism: Secondary | ICD-10-CM | POA: Diagnosis not present

## 2023-12-04 DIAGNOSIS — Z433 Encounter for attention to colostomy: Secondary | ICD-10-CM | POA: Diagnosis not present

## 2023-12-04 DIAGNOSIS — J9611 Chronic respiratory failure with hypoxia: Secondary | ICD-10-CM | POA: Diagnosis not present

## 2023-12-04 DIAGNOSIS — Z48815 Encounter for surgical aftercare following surgery on the digestive system: Secondary | ICD-10-CM | POA: Diagnosis not present

## 2023-12-06 DIAGNOSIS — F1020 Alcohol dependence, uncomplicated: Secondary | ICD-10-CM | POA: Diagnosis not present

## 2023-12-06 DIAGNOSIS — J4489 Other specified chronic obstructive pulmonary disease: Secondary | ICD-10-CM | POA: Diagnosis not present

## 2023-12-06 DIAGNOSIS — Z433 Encounter for attention to colostomy: Secondary | ICD-10-CM | POA: Diagnosis not present

## 2023-12-06 DIAGNOSIS — R6520 Severe sepsis without septic shock: Secondary | ICD-10-CM | POA: Diagnosis not present

## 2023-12-06 DIAGNOSIS — J9611 Chronic respiratory failure with hypoxia: Secondary | ICD-10-CM | POA: Diagnosis not present

## 2023-12-06 DIAGNOSIS — J439 Emphysema, unspecified: Secondary | ICD-10-CM | POA: Diagnosis not present

## 2023-12-06 DIAGNOSIS — A419 Sepsis, unspecified organism: Secondary | ICD-10-CM | POA: Diagnosis not present

## 2023-12-06 DIAGNOSIS — Z48815 Encounter for surgical aftercare following surgery on the digestive system: Secondary | ICD-10-CM | POA: Diagnosis not present

## 2023-12-06 DIAGNOSIS — Z4801 Encounter for change or removal of surgical wound dressing: Secondary | ICD-10-CM | POA: Diagnosis not present

## 2023-12-11 DIAGNOSIS — Z933 Colostomy status: Secondary | ICD-10-CM | POA: Diagnosis not present

## 2023-12-26 ENCOUNTER — Telehealth: Payer: Self-pay

## 2023-12-26 ENCOUNTER — Other Ambulatory Visit: Payer: Self-pay

## 2023-12-26 ENCOUNTER — Other Ambulatory Visit: Payer: Self-pay | Admitting: Family Medicine

## 2023-12-26 DIAGNOSIS — F32A Depression, unspecified: Secondary | ICD-10-CM

## 2023-12-26 DIAGNOSIS — E785 Hyperlipidemia, unspecified: Secondary | ICD-10-CM

## 2023-12-26 MED ORDER — ROSUVASTATIN CALCIUM 10 MG PO TABS: 10.0000 mg | ORAL_TABLET | Freq: Every day | ORAL | 1 refills | Status: DC

## 2023-12-26 NOTE — Telephone Encounter (Signed)
 Copied from CRM #[id number]. Topic: Clinical - Prescription Issue >> Dec 26, 2023 10:39 AM [name] wrote: Reason for CRM: Patient is stating that she did not rcv the med she got message stating it was ready and when went to get was told never got a script for the med, did a refill for the patient for atavin.

## 2023-12-26 NOTE — Telephone Encounter (Unsigned)
 Copied from CRM [id number]. Topic: Clinical - Medication Refill >> Dec 26, 2023 10:31 AM [name] wrote: Medication: LORazepam  (ATIVAN ) 0.5 MG tablet  Has the patient contacted their pharmacy? Yes Pharmacy states did not get a response for the refill  This is the patient's preferred pharmacy:  [id number] [place], [place] - [address] [place] [address] Phone: [phone] Fax: [phone]   Is this the correct pharmacy for this prescription? Yes If no, delete pharmacy and type the correct one.   Has the prescription been filled recently? Yes  Is the patient out of the medication? Yes  Has the patient been seen for an appointment in the last year OR does the patient have an upcoming appointment? Yes  Can we respond through MyChart? Yes  Agent: Please be advised that Rx refills may take up to 3 business days. We ask that you follow-up with your pharmacy.

## 2023-12-30 NOTE — Telephone Encounter (Signed)
 Requested medication (s) are due for refill today:yes  Requested medication (s) are on the active medication list: yes  Last refill:  11/26/23 #30  Future visit scheduled: no  Notes to clinic:  med not delegated to NT to RF   Requested Prescriptions  Pending Prescriptions Disp Refills   LORazepam  (ATIVAN ) 0.5 MG tablet 30 tablet 0    Sig: Take 1 tablet (0.5 mg total) by mouth 2 (two) times daily as needed for anxiety.     Not Delegated - Psychiatry: Anxiolytics/Hypnotics 2 Failed - 12/30/2023  1:11 PM      Failed - This refill cannot be delegated      Failed - Urine Drug Screen completed in last 360 days      Passed - Patient is not pregnant      Passed - Valid encounter within last 6 months    Recent Outpatient Visits           1 month ago Nausea and vomiting, unspecified vomiting type   [place] [facility] Aletha Bene, MD   1 month ago Physical exam, annual   [place] [facility] Kayla Jeoffrey RAMAN, FNP   2 months ago Sepsis, due to unspecified organism, unspecified whether acute organ dysfunction present [facility])   [place] [facility] Aletha Bene, MD   8 months ago Hyperlipidemia, unspecified hyperlipidemia type    [place] [facility] Kayla Jeoffrey RAMAN, FNP   8 months ago Depression, unspecified depression type    [place] [facility] Kayla Jeoffrey RAMAN, FNP

## 2024-01-08 ENCOUNTER — Telehealth: Payer: Self-pay | Admitting: Family Medicine

## 2024-01-08 NOTE — Telephone Encounter (Unsigned)
 Copied from CRM [id number]. Topic: Clinical - Prescription Issue >> Jan 08, 2024  2:21 PM Tiffini S wrote: Reason for CRM: [facility] LOR[facility]epam  (ATIVAN ) 0.5 MG tablet- patient have called twice and have been out for two months. If medication is not approved than please place back on Xanax  1.0mg  Please call the patient at [phone].

## 2024-01-09 ENCOUNTER — Telehealth: Payer: Self-pay

## 2024-01-09 NOTE — Telephone Encounter (Signed)
 Pt. Has requested a Prescription for a nicotine  patch. [facility] [address], if Prescription is approved   Copied from CRM [id number]. Topic: Clinical - Medication Question >> Jan 09, 2024  1:57 PM Gustabo D wrote: Pt wants a nicotine  patch

## 2024-01-09 NOTE — Telephone Encounter (Signed)
 Copied from CRM [id number]. Topic: Clinical - Prescription Issue >> Jan 08, 2024  2:21 PM Tiffini S wrote: Reason for CRM: [facility] LORazepam  (ATIVAN ) 0.5 MG tablet- patient have called twice and have been out for two months. If medication is not approved than please place back on Xanax  1.0mg  Please call the patient at [phone]. >> Jan 09, 2024  1:52 PM Gustabo D wrote: Pt says her pcp put her on Azapam if that's not approved then to put her on xanax . She's been out for 2 months

## 2024-01-10 NOTE — Telephone Encounter (Signed)
 Called to alert pt. To physicians message. Or to call office to answer questions so that we can handle her medication refill in a timely manner.

## 2024-01-14 ENCOUNTER — Other Ambulatory Visit: Payer: Self-pay | Admitting: Family Medicine

## 2024-01-14 DIAGNOSIS — F32A Depression, unspecified: Secondary | ICD-10-CM

## 2024-01-14 MED ORDER — LORAZEPAM 0.5 MG PO TABS: 0.5000 mg | ORAL_TABLET | Freq: Two times a day (BID) | ORAL | 0 refills | Status: DC | PRN

## 2024-01-17 NOTE — Telephone Encounter (Signed)
 Called pt. To alert her once again that we have been tryin to reach her by phone and by my chart. Lvm for call back.

## 2024-01-21 ENCOUNTER — Encounter: Admitting: Family Medicine

## 2024-01-21 ENCOUNTER — Other Ambulatory Visit (HOSPITAL_COMMUNITY): Payer: Self-pay

## 2024-01-21 ENCOUNTER — Ambulatory Visit: Admitting: Family Medicine

## 2024-01-21 NOTE — Progress Notes (Signed)
 No show for video visit

## 2024-01-21 NOTE — Telephone Encounter (Signed)
 Pt. Having video visit today w/ provider .

## 2024-01-22 ENCOUNTER — Other Ambulatory Visit (HOSPITAL_COMMUNITY): Payer: Self-pay

## 2024-01-22 DIAGNOSIS — Z933 Colostomy status: Secondary | ICD-10-CM | POA: Diagnosis not present

## 2024-01-28 ENCOUNTER — Ambulatory Visit: Admitting: Family Medicine

## 2024-02-04 ENCOUNTER — Other Ambulatory Visit: Payer: Self-pay | Admitting: Family Medicine

## 2024-02-04 DIAGNOSIS — G43909 Migraine, unspecified, not intractable, without status migrainosus: Secondary | ICD-10-CM

## 2024-02-10 ENCOUNTER — Encounter: Payer: Self-pay | Admitting: Family Medicine

## 2024-02-17 ENCOUNTER — Ambulatory Visit: Admitting: Family Medicine

## 2024-02-18 ENCOUNTER — Telehealth: Admitting: Family Medicine

## 2024-02-18 ENCOUNTER — Encounter: Payer: Self-pay | Admitting: Family Medicine

## 2024-02-18 ENCOUNTER — Ambulatory Visit: Admitting: Family Medicine

## 2024-02-18 ENCOUNTER — Ambulatory Visit: Payer: Self-pay

## 2024-02-18 DIAGNOSIS — F32A Depression, unspecified: Secondary | ICD-10-CM

## 2024-02-18 DIAGNOSIS — F419 Anxiety disorder, unspecified: Secondary | ICD-10-CM

## 2024-02-18 DIAGNOSIS — A084 Viral intestinal infection, unspecified: Secondary | ICD-10-CM | POA: Diagnosis not present

## 2024-02-18 MED ORDER — NICOTINE 14 MG/24HR TD PT24: 14.0000 mg | MEDICATED_PATCH | Freq: Every day | TRANSDERMAL | 0 refills | Status: AC

## 2024-02-18 MED ORDER — LORAZEPAM 0.5 MG PO TABS: 0.5000 mg | ORAL_TABLET | Freq: Two times a day (BID) | ORAL | 0 refills | Status: DC | PRN

## 2024-02-18 NOTE — Assessment & Plan Note (Signed)
 Holding Dulcolax for watery diarrhea. No systemic symptoms. Encouraged to push electrolyte fluids and monitor for worsening or persistent symptoms. Follow up PRN

## 2024-02-18 NOTE — Telephone Encounter (Signed)
 DR NEEDS TO SEE PT IN PERSON pt refused to speak to nurse since dr asked to have pt come in person/appt    Routing information to PCP office - to attempt to reach out to patient.

## 2024-02-18 NOTE — Progress Notes (Signed)
 Virtual Visit via Video note  I connected with Vicki [name] on 02/18/24 at 1510 by video and verified that I am speaking with the correct person using two identifiers. Vicki [name] is currently located at home and her husband is currently with her during visit. The provider, Jeoffrey GORMAN Barrio, FNP is located in their office at time of visit.  I discussed the limitations, risks, security and privacy concerns of performing an evaluation and management service by video and the availability of in person appointments. I also discussed with the patient that there may be a patient responsible charge related to this service. The patient expressed understanding and agreed to proceed.  Subjective: PCP: Barrio Jeoffrey GORMAN, FNP  No chief complaint on file.   HPI Vicki [name] is connecting today for nausea and diarrhea for 2 days. She had constipation last week and wasn't passing as much stool into her ostomy bag. Starting yesterday she began having watery diarrhea that is filling her bag. Is taking Dulcolax daily but has been holding this today or yesterday. 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 She was DC home with ostomy and wound vac  Denies fever, body aches, vomiting, but is having chills. Her husband was recently sick with diarrhea. No recent antibiotics or medication changes. She is able to tolerate PO intake and is drinking a lot of water.    ROS: Per HPI  Current Outpatient Medications:    nicotine  (NICODERM CQ  - DOSED IN MG/24 HOURS) 14 mg/24hr patch, Place 1 patch (14 mg total) onto the skin daily., Disp: 28 patch, Rfl: 0   acetaminophen  (TYLENOL ) 500 MG tablet, Take 1 tablet (500 mg total) by mouth every 8 (eight) hours as needed for moderate pain (pain score 4-6) or mild pain (pain score 1-3)., Disp: 30 tablet, Rfl: 0   albuterol  (VENTOLIN  HFA) 108 (90 Base) MCG/ACT inhaler, INHALE 2 PUFFS INTO THE  LUNGS TWICE A DAY, Disp: 18 each, Rfl: 11   ASPIRIN  LOW DOSE 81 MG EC tablet, TAKE 1 TABLET BY MOUTH 2 TIMES DAILY., Disp: 180 tablet, Rfl: 1   calcium  carbonate (TUMS) 500 MG chewable tablet, Chew 1 tablet (200 mg of elemental calcium  total) by mouth daily., Disp: 30 tablet, Rfl: 0   citalopram  (CELEXA ) 40 MG tablet, TAKE 1 TABLET BY MOUTH EVERY DAY, Disp: 90 tablet, Rfl: 1   EPINEPHrine  (EPIPEN  2-PAK) 0.3 mg/0.3 mL IJ SOAJ injection, Inject 0.3 mg into the muscle as needed for anaphylaxis., Disp: 1 each, Rfl: 3   Fluticasone -Umeclidin-Vilant (TRELEGY ELLIPTA ) 200-62.5-25 MCG/ACT AEPB, TAKE 1 PUFF BY MOUTH EVERY DAY, Disp: 60 each, Rfl: 11   folic acid  (FOLVITE ) 1 MG tablet, Take 1 tablet (1 mg total) by mouth daily., Disp: 30 tablet, Rfl: 0   LORazepam  (ATIVAN ) 0.5 MG tablet, Take 1 tablet (0.5 mg total) by mouth 2 (two) times daily as needed for anxiety., Disp: 30 tablet, Rfl: 0   Magnesium  Chloride 64 MG TABS, Take 2 tablets by mouth in the morning, at noon, and at bedtime., Disp: 30 tablet, Rfl: 0   midodrine  (PROAMATINE ) 5 MG tablet, Take 1 tablet (5 mg total) by mouth 2 (two) times daily with a meal., Disp: 180 tablet, Rfl: 0   omeprazole  (PRILOSEC) 40 MG capsule, Take 1 capsule (40 mg total) by mouth 2 (two) times daily. Take 30 mins. Prior to breakfast and supper, Disp: 60 capsule, Rfl: 11   ondansetron  (ZOFRAN ) 4 MG tablet, Take  1 tablet (4 mg total) by mouth every 8 (eight) hours as needed for nausea or vomiting. (Patient not taking: Reported on 11/14/2023), Disp: 15 tablet, Rfl: 0   ondansetron  (ZOFRAN ) 8 MG tablet, Take 1 tablet (8 mg total) by mouth every 8 (eight) hours as needed for nausea or vomiting., Disp: 30 tablet, Rfl: 1   oxyCODONE  (ROXICODONE ) 5 MG immediate release tablet, Take 1 tablet (5 mg total) by mouth every 4 (four) hours as needed for severe pain (pain score 7-10)., Disp: 12 tablet, Rfl: 0   potassium chloride  SA (KLOR-CON  M) 20 MEQ tablet, Take 1 tablet (20 mEq total) by  mouth daily., Disp: 90 tablet, Rfl: 3   promethazine  (PHENERGAN ) 25 MG tablet, Take 1 tablet (25 mg total) by mouth every 8 (eight) hours as needed for nausea or vomiting., Disp: 20 tablet, Rfl: 0   rizatriptan  (MAXALT ) 10 MG tablet, TAKE 1 TABLET BY MOUTH AS NEEDED FOR MIGRAINE. MAY REPEAT IN 2 HOURS IF NEEDED STRENGTH: 10 MG, Disp: 10 tablet, Rfl: 0   rOPINIRole  (REQUIP ) 2 MG tablet, TAKE 1/2 TO 1 TABLETS BY MOUTH AT BEDTIME., Disp: 90 tablet, Rfl: 3   rosuvastatin  (CRESTOR ) 10 MG tablet, Take 1 tablet (10 mg total) by mouth daily., Disp: 90 tablet, Rfl: 1   thiamine  (VITAMIN B1) 100 MG tablet, Take 1 tablet (100 mg total) by mouth daily., Disp: 30 tablet, Rfl: 0   triamcinolone  ointment (KENALOG ) 0.5 %, Apply topically to your back 2 (two) times daily., Disp: 30 g, Rfl: 0  Observations/Objective: Physical Exam Constitutional:      General: She is not in acute distress.    Appearance: Normal appearance. She is not toxic-appearing.  Eyes:     Conjunctiva/sclera: Conjunctivae normal.  Pulmonary:     Effort: Pulmonary effort is normal. No respiratory distress.  Skin:    Coloration: Skin is not pale.  Neurological:     General: No focal deficit present.     Mental Status: She is alert and oriented to person, place, and time.  Psychiatric:        Mood and Affect: Mood normal.        Behavior: Behavior normal.        Thought Content: Thought content normal.        Judgment: Judgment normal.    Assessment and Plan: Viral gastroenteritis Assessment & Plan: Holding Dulcolax for watery diarrhea. No systemic symptoms. Encouraged to push electrolyte fluids and monitor for worsening or persistent symptoms. Follow up PRN   Anxiety and depression -     LORazepam ; Take 1 tablet (0.5 mg total) by mouth 2 (two) times daily as needed for anxiety.  Dispense: 30 tablet; Refill: 0  Other orders -     Nicotine ; Place 1 patch (14 mg total) onto the skin daily.  Dispense: 28 patch; Refill:  0    Follow Up Instructions: Return if symptoms worsen or fail to improve.   I discussed the assessment and treatment plan with the patient. The patient was provided an opportunity to ask questions and all were answered. The patient agreed with the plan and demonstrated an understanding of the instructions.   The patient was advised to call back or seek an in-person evaluation if the symptoms worsen or if the condition fails to improve as anticipated.  The above assessment and management plan was discussed with the patient. The patient verbalized understanding of and has agreed to the management plan. Patient is aware to call the clinic if  symptoms persist or worsen. Patient is aware when to return to the clinic for a follow-up visit. Patient educated on when it is appropriate to go to the emergency department.   Time call ended: 1522  I provided 12 minutes of face-to-face time during this encounter.   Jeoffrey Barrio, MSN, APRN, FNP-C [facility]

## 2024-02-19 ENCOUNTER — Encounter: Payer: Self-pay | Admitting: Family Medicine

## 2024-02-19 ENCOUNTER — Ambulatory Visit (INDEPENDENT_AMBULATORY_CARE_PROVIDER_SITE_OTHER): Admitting: Family Medicine

## 2024-02-19 VITALS — BP 122/78 | HR 108 | Temp 97.4°F | Ht 63.0 in | Wt 138.4 lb

## 2024-02-19 DIAGNOSIS — Z939 Artificial opening status, unspecified: Secondary | ICD-10-CM

## 2024-02-19 DIAGNOSIS — Z1329 Encounter for screening for other suspected endocrine disorder: Secondary | ICD-10-CM | POA: Diagnosis not present

## 2024-02-19 DIAGNOSIS — E785 Hyperlipidemia, unspecified: Secondary | ICD-10-CM

## 2024-02-19 DIAGNOSIS — Z1321 Encounter for screening for nutritional disorder: Secondary | ICD-10-CM | POA: Diagnosis not present

## 2024-02-19 DIAGNOSIS — Z79899 Other long term (current) drug therapy: Secondary | ICD-10-CM | POA: Diagnosis not present

## 2024-02-19 DIAGNOSIS — Z720 Tobacco use: Secondary | ICD-10-CM | POA: Diagnosis not present

## 2024-02-19 DIAGNOSIS — Z23 Encounter for immunization: Secondary | ICD-10-CM | POA: Diagnosis not present

## 2024-02-19 DIAGNOSIS — Z131 Encounter for screening for diabetes mellitus: Secondary | ICD-10-CM

## 2024-02-19 DIAGNOSIS — K769 Liver disease, unspecified: Secondary | ICD-10-CM

## 2024-02-19 DIAGNOSIS — D649 Anemia, unspecified: Secondary | ICD-10-CM | POA: Diagnosis not present

## 2024-02-19 DIAGNOSIS — K219 Gastro-esophageal reflux disease without esophagitis: Secondary | ICD-10-CM

## 2024-02-19 DIAGNOSIS — F411 Generalized anxiety disorder: Secondary | ICD-10-CM

## 2024-02-19 DIAGNOSIS — J9611 Chronic respiratory failure with hypoxia: Secondary | ICD-10-CM | POA: Diagnosis not present

## 2024-02-19 NOTE — Assessment & Plan Note (Signed)
 Needs follow up with surgeon to assess if reversal is an option

## 2024-02-19 NOTE — Assessment & Plan Note (Signed)
 History of hepatic steatosis with liver lesion on recent CT. Recommended follow up with MRI ordered. Referred back to GI for consultation and annual follow up for chronic gastritis, diverticulitis, recent ostomy, GERD.

## 2024-02-19 NOTE — Assessment & Plan Note (Signed)
 well controlled on Celexa  40mg  daily and PRN Ativan 

## 2024-02-19 NOTE — Assessment & Plan Note (Signed)
 Lipids today, on crestor  10mg  daily

## 2024-02-19 NOTE — Assessment & Plan Note (Signed)
 Provided number for pulm referral and lung ct. Nicotine  patches ordered. Counseled on importance of smoking cessation

## 2024-02-19 NOTE — Assessment & Plan Note (Signed)
 home O2 4L, followed by Pulmonology Dr Valorie, Trelegy and PRN Albuterol , has restarted smoking, nicotine  patches ordered

## 2024-02-19 NOTE — Progress Notes (Signed)
 Subjective:  HPI: Vicki [name] is a 58 y.o. female presenting on 02/19/2024 for Medical Management of Chronic Issues (Chronic condition /Pt would like flu shot)   HPI Patient is in today for follow up and chronic condition management. Vicki [name] is s/p hartman's procedure in May for perforated diverticulitis with a new colostomy. She is not established with a Solicitor yet. Has had some ongoing nausea and vomiting with acute worsening of symptoms 2 days ago that has improved.   Anxiety: well controlled on Celexa  40mg  daily and PRN Ativan    COPD: home O2 4L, followed by Pulmonology Dr Valorie, Trelegy and PRN Albuterol , has restarted smoking, nicotine  patches ordered   HLD: bASA, on Crestor  10mg  daily, currently inactive   Nicotine  Dependence: has restarted smoking unfortunately, nicotine  patches ordered, has not completed lung CT   Liver lesion: incidental finding on CT measuring 1.7 x 3cm, MRI ordered but not yet completed, overdue for GI follow up   Elevated TSH: has not started Synthroid, repeat TSH panel today, euthyroid on exam    Anemia: repeat CBC, not taking iron supplement  S/P Hartman's procedure: managing well with ostomy  Review of Systems  All other systems reviewed and are negative.   Relevant past medical history reviewed and updated as indicated.   Past Medical History:  Diagnosis Date   Allergy    Anxiety    Arthritis    Asthma    CAP (community acquired pneumonia) 04/07/2019   Carpal tunnel syndrome    Cataract    COPD (chronic obstructive pulmonary disease) (HCC)    Depression    Diverticulitis    Emphysema of lung (HCC)    Essential hypertension, benign 07/14/2019   Hyperlipidemia    Oxygen deficiency    Oxygen dependent    on 5 L cont [place]     Past Surgical History:  Procedure Laterality Date   ABDOMINAL HYSTERECTOMY  2009   APPENDECTOMY     ARTHROSCOPIC REPAIR ACL Left 2011/2014   x 2   BIOPSY  01/21/2020   Procedure:  BIOPSY;  Surgeon: Shila Gustav GAILS, MD;  Location: [facility];  Service: Endoscopy;;  EGD and COLON   BUNIONECTOMY  2006   both   CARPAL TUNNEL RELEASE Bilateral 10/28/2013   Procedure: BILATERAL CARPAL TUNNEL RELEASE;  Surgeon: Arley JONELLE Curia, MD;  Location: [facility];  Service: Orthopedics;  Laterality: Bilateral;   CHOLECYSTECTOMY  1994   CHONDROPLASTY Right 08/31/2020   Procedure: CHONDROPLASTY;  Surgeon: Shari Sieving, MD;  Location: [facility];  Service: Orthopedics;  Laterality: Right;   COLONOSCOPY WITH PROPOFOL  N/A 01/21/2020   Procedure: COLONOSCOPY WITH PROPOFOL ;  Surgeon: Shila Gustav GAILS, MD;  Location: [facility];  Service: Endoscopy;  Laterality: N/A;   DILATION AND CURETTAGE OF UTERUS     ESOPHAGOGASTRODUODENOSCOPY (EGD) WITH PROPOFOL  N/A 01/21/2020   Procedure: ESOPHAGOGASTRODUODENOSCOPY (EGD) WITH PROPOFOL ;  Surgeon: Shila Gustav GAILS, MD;  Location: [facility];  Service: Endoscopy;  Laterality: N/A;   KNEE ARTHROSCOPY WITH ANTERIOR CRUCIATE LIGAMENT (ACL) REPAIR Right 08/31/2020   Procedure: RIGHT KNEE ARTHROSCOPY WITH ANTERIOR CRUCIATE LIGAMENT (ACL) REPAIR MEDIAL AND LATERAL  MENISECTOMY;  Surgeon: Shari Sieving, MD;  Location: [facility];  Service: Orthopedics;  Laterality: Right;  FEMORAL NERVE BLOCK   LAPAROTOMY N/A 10/10/2023   Procedure: EXPLORATORY LAPAROTOMY, PARTIAL COLECTOMY AND COLOSTOMY;  Surgeon: Teresa Lonni HERO, MD;  Location: [facility];  Service: General;  Laterality: N/A;  EXPLORATORY LAPAROTOMY , PARTIAL COLECTOMY AND  COLOSTOMY   POLYPECTOMY  01/21/2020   Procedure: POLYPECTOMY;  Surgeon: Shila Gustav GAILS, MD;  Location: [facility];  Service: Endoscopy;;   TUBAL LIGATION  2004    Allergies and medications reviewed and updated.   Current Outpatient Medications:    acetaminophen  (TYLENOL ) 500 MG tablet, Take 1 tablet (500 mg total) by mouth every 8 (eight) hours as needed for moderate pain (pain  score 4-6) or mild pain (pain score 1-3)., Disp: 30 tablet, Rfl: 0   albuterol  (VENTOLIN  HFA) 108 (90 Base) MCG/ACT inhaler, INHALE 2 PUFFS INTO THE LUNGS TWICE A DAY, Disp: 18 each, Rfl: 11   ASPIRIN  LOW DOSE 81 MG EC tablet, TAKE 1 TABLET BY MOUTH 2 TIMES DAILY., Disp: 180 tablet, Rfl: 1   calcium  carbonate (TUMS) 500 MG chewable tablet, Chew 1 tablet (200 mg of elemental calcium  total) by mouth daily., Disp: 30 tablet, Rfl: 0   citalopram  (CELEXA ) 40 MG tablet, TAKE 1 TABLET BY MOUTH EVERY DAY, Disp: 90 tablet, Rfl: 1   EPINEPHrine  (EPIPEN  2-PAK) 0.3 mg/0.3 mL IJ SOAJ injection, Inject 0.3 mg into the muscle as needed for anaphylaxis., Disp: 1 each, Rfl: 3   Fluticasone -Umeclidin-Vilant (TRELEGY ELLIPTA ) 200-62.5-25 MCG/ACT AEPB, TAKE 1 PUFF BY MOUTH EVERY DAY, Disp: 60 each, Rfl: 11   folic acid  (FOLVITE ) 1 MG tablet, Take 1 tablet (1 mg total) by mouth daily., Disp: 30 tablet, Rfl: 0   LORazepam  (ATIVAN ) 0.5 MG tablet, Take 1 tablet (0.5 mg total) by mouth 2 (two) times daily as needed for anxiety., Disp: 30 tablet, Rfl: 0   Magnesium  Chloride 64 MG TABS, Take 2 tablets by mouth in the morning, at noon, and at bedtime., Disp: 30 tablet, Rfl: 0   midodrine  (PROAMATINE ) 5 MG tablet, Take 1 tablet (5 mg total) by mouth 2 (two) times daily with a meal., Disp: 180 tablet, Rfl: 0   nicotine  (NICODERM CQ  - DOSED IN MG/24 HOURS) 14 mg/24hr patch, Place 1 patch (14 mg total) onto the skin daily., Disp: 28 patch, Rfl: 0   omeprazole  (PRILOSEC) 40 MG capsule, Take 1 capsule (40 mg total) by mouth 2 (two) times daily. Take 30 mins. Prior to breakfast and supper, Disp: 60 capsule, Rfl: 11   ondansetron  (ZOFRAN ) 8 MG tablet, Take 1 tablet (8 mg total) by mouth every 8 (eight) hours as needed for nausea or vomiting., Disp: 30 tablet, Rfl: 1   potassium chloride  SA (KLOR-CON  M) 20 MEQ tablet, Take 1 tablet (20 mEq total) by mouth daily., Disp: 90 tablet, Rfl: 3   promethazine  (PHENERGAN ) 25 MG tablet, Take 1  tablet (25 mg total) by mouth every 8 (eight) hours as needed for nausea or vomiting., Disp: 20 tablet, Rfl: 0   rizatriptan  (MAXALT ) 10 MG tablet, TAKE 1 TABLET BY MOUTH AS NEEDED FOR MIGRAINE. MAY REPEAT IN 2 HOURS IF NEEDED STRENGTH: 10 MG, Disp: 10 tablet, Rfl: 0   rOPINIRole  (REQUIP ) 2 MG tablet, TAKE 1/2 TO 1 TABLETS BY MOUTH AT BEDTIME., Disp: 90 tablet, Rfl: 3   rosuvastatin  (CRESTOR ) 10 MG tablet, Take 1 tablet (10 mg total) by mouth daily., Disp: 90 tablet, Rfl: 1   thiamine  (VITAMIN B1) 100 MG tablet, Take 1 tablet (100 mg total) by mouth daily., Disp: 30 tablet, Rfl: 0   oxyCODONE  (ROXICODONE ) 5 MG immediate release tablet, Take 1 tablet (5 mg total) by mouth every 4 (four) hours as needed for severe pain (pain score 7-10). (Patient not taking: Reported on 02/19/2024), Disp: 12 tablet,  Rfl: 0  Allergies  Allergen Reactions   Jasmine Oil Shortness Of Breath and Swelling   Levaquin  [Levofloxacin ] Shortness Of Breath, Itching, Swelling and Rash   Penicillins Anaphylaxis and Hives    Tolerated ceftriaxone    Klonopin  [Clonazepam ]     insomnia   Aleve [Naproxen Sodium] Itching   Chlorhexidine  Other (See Comments)    Unknown reaction   Depo-Provera [Medroxyprogesterone Acetate] Other (See Comments)    Severe acne   Doxycycline  Other (See Comments)    Raises blood pressure    Tramadol Other (See Comments)    Kept her up all night    Objective:   BP 122/78   Pulse (!) 108   Temp (!) 97.4 F (36.3 C)   Ht 5' 3 (1.6 m)   Wt 138 lb 6.4 oz (62.8 kg)   SpO2 95%   BMI 24.52 kg/m      02/19/2024    2:18 PM 11/27/2023    9:45 PM 11/27/2023    9:30 PM  Vitals with BMI  Height 5' 3    Weight 138 lbs 6 oz    BMI 24.52    Systolic 122 110 884  Diastolic 78 80 83  Pulse 108 77 79     Physical Exam Vitals and nursing note reviewed.  Constitutional:      Appearance: Normal appearance. She is normal weight.  HENT:     Head: Normocephalic and atraumatic.  Cardiovascular:      Rate and Rhythm: Normal rate and regular rhythm.     Pulses: Normal pulses.     Heart sounds: Normal heart sounds.  Pulmonary:     Effort: Pulmonary effort is normal.     Breath sounds: Normal breath sounds.  Abdominal:     General: The ostomy site is clean. Bowel sounds are increased.     Tenderness: There is abdominal tenderness.  Skin:    General: Skin is warm and dry.  Neurological:     General: No focal deficit present.     Mental Status: She is alert and oriented to person, place, and time. Mental status is at baseline.  Psychiatric:        Mood and Affect: Mood normal.        Behavior: Behavior normal.        Thought Content: Thought content normal.        Judgment: Judgment normal.     Assessment & Plan:  Hyperlipidemia, unspecified hyperlipidemia type Assessment & Plan: Lipids today, on crestor  10mg  daily  Orders: -     CBC with Differential/Platelet -     Comprehensive metabolic panel with GFR -     Lipid panel -     Hemoglobin A1c  Anemia, unspecified type Assessment & Plan: CBC today  Orders: -     CBC with Differential/Platelet  Screening for thyroid  disorder Assessment & Plan: has not started Synthroid, repeat TSH panel today, euthyroid on exam   Orders: -     TSH  Liver lesion -     CBC with Differential/Platelet -     Comprehensive metabolic panel with GFR  Encounter for vitamin deficiency screening -     VITAMIN D  25 Hydroxy (Vit-D Deficiency, Fractures)  Screening for diabetes mellitus -     Hemoglobin A1c  Medication management -     Magnesium   Need for vaccination -     Flu vaccine trivalent PF, 6mos and older(Flulaval,Afluria,Fluarix,Fluzone)  Chronic respiratory failure with hypoxia (HCC) Assessment & Plan: home  O2 4L, followed by Pulmonology Dr Valorie, Trelegy and PRN Albuterol , has restarted smoking, nicotine  patches ordered   Gastroesophageal reflux disease without esophagitis Assessment & Plan: Off omeprazole  due to low  Mg   Tobacco abuse Assessment & Plan: Provided number for pulm referral and lung ct. Nicotine  patches ordered. Counseled on importance of smoking cessation   History of creation of ostomy [facility]) Assessment & Plan: Needs follow up with surgeon to assess if reversal is an option   GAD (generalized anxiety disorder) Assessment & Plan: well controlled on Celexa  40mg  daily and PRN Ativan    Liver disease Assessment & Plan: History of hepatic steatosis with liver lesion on recent CT. Recommended follow up with MRI ordered. Referred back to GI for consultation and annual follow up for chronic gastritis, diverticulitis, recent ostomy, GERD.      Follow up plan: Return in about 3 months (around 05/21/2024) for chronic follow-up with labs 1 week prior.  Jeoffrey GORMAN Barrio, FNP

## 2024-02-19 NOTE — Assessment & Plan Note (Signed)
 Off omeprazole  due to low Mg

## 2024-02-19 NOTE — Assessment & Plan Note (Signed)
 has not started Synthroid, repeat TSH panel today, euthyroid on exam

## 2024-02-19 NOTE — Assessment & Plan Note (Signed)
 CBC today.

## 2024-02-20 LAB — COMPREHENSIVE METABOLIC PANEL WITH GFR
AG Ratio: 1.2 (calc) (ref 1.0–2.5)
ALT: 13 U/L (ref 6–29)
AST: 48 U/L — ABNORMAL HIGH (ref 10–35)
Albumin: 3.7 g/dL (ref 3.6–5.1)
Alkaline phosphatase (APISO): 119 U/L (ref 37–153)
BUN: 9 mg/dL (ref 7–25)
CO2: 33 mmol/L — ABNORMAL HIGH (ref 20–32)
Calcium: 8.9 mg/dL (ref 8.6–10.4)
Chloride: 104 mmol/L (ref 98–110)
Creat: 0.55 mg/dL (ref 0.50–1.03)
Globulin: 3 g/dL (ref 1.9–3.7)
Glucose, Bld: 100 mg/dL — ABNORMAL HIGH (ref 65–99)
Potassium: 4.3 mmol/L (ref 3.5–5.3)
Sodium: 143 mmol/L (ref 135–146)
Total Bilirubin: 0.6 mg/dL (ref 0.2–1.2)
Total Protein: 6.7 g/dL (ref 6.1–8.1)
eGFR: 108 mL/min/1.73m2 (ref >=60)

## 2024-02-20 LAB — CBC WITH DIFFERENTIAL/PLATELET
Absolute Lymphocytes: 3034 {cells}/uL (ref 850–3900)
Absolute Monocytes: 703 {cells}/uL (ref 200–950)
Basophils Absolute: 37 {cells}/uL (ref 0–200)
Basophils Relative: 0.5 %
Eosinophils Absolute: 370 {cells}/uL (ref 15–500)
Eosinophils Relative: 5 %
HCT: 42.4 % (ref 35.0–45.0)
Hemoglobin: 13.7 g/dL (ref 11.7–15.5)
MCH: 31.9 pg (ref 27.0–33.0)
MCHC: 32.3 g/dL (ref 32.0–36.0)
MCV: 98.8 fL (ref 80.0–100.0)
MPV: 10.9 fL (ref 7.5–12.5)
Monocytes Relative: 9.5 %
Neutro Abs: 3256 {cells}/uL (ref 1500–7800)
Neutrophils Relative %: 44 %
Platelets: 190 Thousand/uL (ref 140–400)
RBC: 4.29 Million/uL (ref 3.80–5.10)
RDW: 12.4 % (ref 11.0–15.0)
Total Lymphocyte: 41 %
WBC: 7.4 Thousand/uL (ref 3.8–10.8)

## 2024-02-20 LAB — HEMOGLOBIN A1C
Hgb A1c MFr Bld: 4.9 % (ref <5.7)
Mean Plasma Glucose: 94 mg/dL
eAG (mmol/L): 5.2 mmol/L

## 2024-02-20 LAB — LIPID PANEL
Cholesterol: 224 mg/dL — ABNORMAL HIGH (ref <200)
HDL: 90 mg/dL (ref >=50)
LDL Cholesterol (Calc): 116 mg/dL — ABNORMAL HIGH
Non-HDL Cholesterol (Calc): 134 mg/dL — ABNORMAL HIGH (ref <130)
Total CHOL/HDL Ratio: 2.5 (calc) (ref <5.0)
Triglycerides: 84 mg/dL (ref <150)

## 2024-02-20 LAB — MAGNESIUM: Magnesium: 1.2 mg/dL — ABNORMAL LOW (ref 1.5–2.5)

## 2024-02-20 LAB — VITAMIN D 25 HYDROXY (VIT D DEFICIENCY, FRACTURES): Vit D, 25-Hydroxy: 13 ng/mL — ABNORMAL LOW (ref 30–100)

## 2024-02-20 LAB — TSH: TSH: 1.97 m[IU]/L (ref 0.40–4.50)

## 2024-02-25 ENCOUNTER — Ambulatory Visit: Payer: Self-pay | Admitting: Family Medicine

## 2024-02-25 DIAGNOSIS — R112 Nausea with vomiting, unspecified: Secondary | ICD-10-CM

## 2024-02-25 DIAGNOSIS — E785 Hyperlipidemia, unspecified: Secondary | ICD-10-CM

## 2024-02-25 MED ORDER — CHOLECALCIFEROL 1.25 MG (50000 UT) PO TABS: 1.0000 | ORAL_TABLET | ORAL | 0 refills | Status: AC

## 2024-02-25 MED ORDER — MAGNESIUM CHLORIDE 64 MG PO TABS: 2.0000 | ORAL_TABLET | Freq: Three times a day (TID) | ORAL | 0 refills | Status: AC

## 2024-02-25 MED ORDER — ROSUVASTATIN CALCIUM 20 MG PO TABS: 20.0000 mg | ORAL_TABLET | Freq: Every day | ORAL | 1 refills | Status: AC

## 2024-03-03 ENCOUNTER — Telehealth: Payer: Self-pay | Admitting: Family Medicine

## 2024-03-03 NOTE — Telephone Encounter (Signed)
 Copied from CRM #[id number]. Topic: Medical Record Request - Records Request >> Mar 03, 2024  8:06 AM Vicki [name] wrote: Reason for CRM: Pt called in requesting a copy of her medical records for her surgery in may for gastroenterology. Wants detailed copies, if someone can follow up with pt before having her records printed.   Wants during the procedure and the after surgery records.   Pls call pt and follow up.    Phone : [phone]  Advised patient to contact the gastroenterologist and make arrangements to fill out a release of information form

## 2024-03-17 ENCOUNTER — Ambulatory Visit: Payer: Self-pay

## 2024-03-17 ENCOUNTER — Other Ambulatory Visit: Payer: Self-pay | Admitting: Family Medicine

## 2024-03-17 ENCOUNTER — Encounter: Payer: Self-pay | Admitting: Family Medicine

## 2024-03-17 DIAGNOSIS — Z1231 Encounter for screening mammogram for malignant neoplasm of breast: Secondary | ICD-10-CM

## 2024-03-17 DIAGNOSIS — R112 Nausea with vomiting, unspecified: Secondary | ICD-10-CM

## 2024-03-17 DIAGNOSIS — F32A Depression, unspecified: Secondary | ICD-10-CM

## 2024-03-17 NOTE — Telephone Encounter (Signed)
 FYI Only or Action Required?: FYI only for provider: appointment scheduled on 03/18/24.  Patient was last seen in primary care on 02/19/2024 by Kayla Jeoffrey RAMAN, FNP.  Called Nurse Triage reporting [place].  Symptoms began several days ago.  Interventions attempted: Nothing.  Symptoms are: unchanged.  Triage Disposition: See PCP When Office is Open (Within 3 Days)  Patient/caregiver understands and will follow disposition?: Yes   Copied from CRM [id number]. Topic: Clinical - Red Word Triage >> Mar 17, 2024  4:56 PM Wess RAMAN wrote: Red Word that prompted transfer to Nurse Triage: Black spot on right arm with a white spot in the middle. Spot is itchy and a little swollen. Has been going on for 2 weeks  Insomnia for about a month and would like sleep medication.  Pharmacy:  [id number] [place], [place] - [address] [place] [address] Phone: [phone] Fax: [phone] Hours: Not open 24 hours Reason for Disposition  Caller can't describe it clearly  Answer Assessment - Initial Assessment Questions No available appts today. Scheduled appt 03/18/24.  Advised UC/ED if symptoms worsen.   1. APPEARANCE of LESION: What does it look like?      Black spot with white dot in middle, size of sweet pea;  itches, redness around white spot, flat, mild swelling upper arm denies drainage, pain, red streaks 2. SIZE: How big is it? (e.g., inches, cm; or compare to size of pinhead, tip of pen, eraser, coin, pea, grape, ping pong ball)      Sweet pea 3. COLOR: What color is it? Is there more than one color?     Black spot, white spot in center, looks crusty, red 4. SHAPE: What shape is it? (e.g., round, irregular)     round 5. RAISED: Does it stick up above the skin or is it flat? (e.g., raised or elevated)     flat 6. TENDER: Does it hurt when you touch it?  (Scale 1-10; or mild, moderate, severe)     Tender to  touch, mild 7. LOCATION: Where is it located?      Right arm 8. ONSET: When did it first appear?      Last week 9. NUMBER: Is there just one? or Are there others?     1 10. CAUSE: What do you think it is?       unsure 11. OTHER SYMPTOMS: Do you have any other symptoms? (e.g., fever)       Denies diff breathing, chest pain, fever, vomiting; chronic chills/ nausea  Protocols used: Skin Lesion - Moles or Growths-A-AH

## 2024-03-17 NOTE — Telephone Encounter (Signed)
 Copied from CRM [id number]. Topic: Clinical - Medication Refill >> Mar 17, 2024  4:50 PM Wess S wrote: Medication: promethazine  (PHENERGAN ) 25 MG tablet  Fluticasone -Umeclidin-Vilant (TRELEGY ELLIPTA ) 200-62.5-25 MCG/ACT AEPB  LORazepam  (ATIVAN ) 0.5 MG tablet   Has the patient contacted their pharmacy? No (Agent: If no, request that the patient contact the pharmacy for the refill. If patient does not wish to contact the pharmacy document the reason why and proceed with request.) (Agent: If yes, when and what did the pharmacy advise?)  This is the patient's preferred pharmacy:  [id number] [place], [place] - [address] [place] [address] Phone: [phone] Fax: [phone]  [facility] [address] [place] [address] Phone: [phone] Fax: [phone]  [facility] - [address] [place] - [address] [place] [address] Phone: [phone] Fax: [phone]  Is this the correct pharmacy for this prescription? Yes If no, delete pharmacy and type the correct one.   Has the prescription been filled recently? Yes  Is the patient out of the medication? Yes  Has the patient been seen for an appointment in the last year OR does the patient have an upcoming appointment? Yes  Can we respond through MyChart? Yes  Agent: Please be advised that Rx refills may take up to 3 business days. We ask that you follow-up with your pharmacy.

## 2024-03-18 ENCOUNTER — Ambulatory Visit: Admitting: Family Medicine

## 2024-03-19 NOTE — Telephone Encounter (Signed)
 Requested medication (s) are due for refill today: yes  Requested medication (s) are on the active medication list: yes  Last refill:  11/22/23,02/18/24,02/22/24  Future visit scheduled: yes  Notes to clinic:  Unable to refill per protocol, cannot delegate.      Requested Prescriptions  Pending Prescriptions Disp Refills   promethazine  (PHENERGAN ) 25 MG tablet 20 tablet 0    Sig: Take 1 tablet (25 mg total) by mouth every 8 (eight) hours as needed for nausea or vomiting.     Not Delegated - Gastroenterology: Antiemetics Failed - 03/19/2024  1:39 PM      Failed - This refill cannot be delegated      Passed - Valid encounter within last 6 months    Recent Outpatient Visits           4 weeks ago Hyperlipidemia, unspecified hyperlipidemia type   [facility] Kayla Jeoffrey RAMAN, FNP   1 month ago Viral gastroenteritis   [facility] Kayla Jeoffrey RAMAN, FNP   3 months ago Nausea and vomiting, unspecified vomiting type   [place] [facility] Aletha Bene, MD   4 months ago Physical exam, annual   [place] [facility] Kayla Jeoffrey RAMAN, FNP   4 months ago Sepsis, due to unspecified organism, unspecified whether acute organ dysfunction present [facility])   [place] [facility] Aletha Bene, MD               LORazepam  (ATIVAN ) 0.5 MG tablet 30 tablet 0    Sig: Take 1 tablet (0.5 mg total) by mouth 2 (two) times daily as needed for anxiety.     Not Delegated - Psychiatry: Anxiolytics/Hypnotics 2 Failed - 03/19/2024  1:39 PM      Failed - This refill cannot be delegated      Failed - Urine Drug Screen completed in last 360 days      Passed - Patient is not pregnant      Passed - Valid encounter within last 6 months    Recent Outpatient Visits           4 weeks ago Hyperlipidemia, unspecified hyperlipidemia type   [place] [facility] Kayla Jeoffrey RAMAN, FNP   1 month ago Viral gastroenteritis   [facility] Kayla Jeoffrey RAMAN, FNP   3 months ago Nausea and vomiting, unspecified vomiting type   [facility] Aletha Bene, MD   4 months ago Physical exam, annual   [place] [facility] Kayla Jeoffrey RAMAN, FNP   4 months ago Sepsis, due to unspecified organism, unspecified whether acute organ dysfunction present [facility])   [place] [facility] Aletha Bene, MD               Fluticasone -Umeclidin-Vilant (TRELEGY ELLIPTA ) 200-62.5-25 MCG/ACT AEPB 60 each 11     Off-Protocol Failed - 03/19/2024  1:39 PM      Failed - Medication not assigned to a protocol, review manually.      Passed - Valid encounter within last 12 months    Recent Outpatient Visits           4 weeks ago Hyperlipidemia, unspecified hyperlipidemia type   [facility] Kayla Jeoffrey RAMAN, FNP   1 month ago Viral gastroenteritis    [facility] Kayla Jeoffrey RAMAN, FNP  3 months ago Nausea and vomiting, unspecified vomiting type   [facility] Medicine Aletha Bene, MD   4 months ago Physical exam, annual   [place] [facility] Medicine Kayla Jeoffrey RAMAN, FNP   4 months ago Sepsis, due to unspecified organism, unspecified whether acute organ dysfunction present [facility])   [place] [facility] Medicine Aletha Bene, MD

## 2024-03-20 MED ORDER — TRELEGY ELLIPTA 200-62.5-25 MCG/ACT IN AEPB: 1.0000 | INHALATION_SPRAY | Freq: Every day | RESPIRATORY_TRACT | 11 refills | Status: AC

## 2024-03-20 MED ORDER — LORAZEPAM 0.5 MG PO TABS: 0.5000 mg | ORAL_TABLET | Freq: Two times a day (BID) | ORAL | 0 refills | Status: DC | PRN

## 2024-03-20 MED ORDER — PROMETHAZINE HCL 25 MG PO TABS: 25.0000 mg | ORAL_TABLET | Freq: Three times a day (TID) | ORAL | 0 refills | Status: DC | PRN

## 2024-03-31 NOTE — Progress Notes (Deleted)
 Chief Complaint:  HPI:    Vicki [name] is a 58 year old female, known to Dr. Shila, with a past medical history as listed below including COPD on oxygen at 5 L, emphysema, arthritis, anxiety and multiple others, who was referred to me by Kayla Jeoffrey RAMAN, FNP for a complaint of *** .      12/25/2019 gastric emptying study normal.    01/21/2020 EGD done for persistent vomiting with LA grade B reflux esophagitis, gastritis and otherwise normal.  Recommended Prilosec 20 mg daily.    01/21/2020 colonoscopy at [facility] with an 8 mm polyp in the transverse colon, mild diverticulosis in the sigmoid, descending, transverse and ascending colon congested and free of mucosa in the rectum and nonbleeding internal hemorrhoids.    03/04/2023 patient seen in clinic by me for chronic nausea and vomiting.  No diarrhea.  At that point increase Meprazole to 40 twice daily and recommend she use Zofran  4 mg 1-2 caps scheduled.  Also discussed possible Phenergan  suppositories for breakthrough.    02/19/2024 CMP glucose of 100, AST 48 and otherwise normal.  (11/27/2023 AST of 84).  TSH normal.  Hemoglobin A1c normal.  CBC normal.  Vitamin D  low at 13.  Magnesium  low at 1.2.      Past Medical History:  Diagnosis Date   Allergy    Anxiety    Arthritis    Asthma    CAP (community acquired pneumonia) 04/07/2019   Carpal tunnel syndrome    Cataract    COPD (chronic obstructive pulmonary disease) (HCC)    Depression    Diverticulitis    Emphysema of lung (HCC)    Essential hypertension, benign 07/14/2019   Hyperlipidemia    Oxygen deficiency    Oxygen dependent    on 5 L cont [place]    Past Surgical History:  Procedure Laterality Date   ABDOMINAL HYSTERECTOMY  2009   APPENDECTOMY     ARTHROSCOPIC REPAIR ACL Left 2011/2014   x 2   BIOPSY  01/21/2020   Procedure: BIOPSY;  Surgeon: Shila Gustav GAILS, MD;  Location: [facility];  Service: Endoscopy;;  EGD and COLON   BUNIONECTOMY  2006   both    CARPAL TUNNEL RELEASE Bilateral 10/28/2013   Procedure: BILATERAL CARPAL TUNNEL RELEASE;  Surgeon: Arley JONELLE Curia, MD;  Location: [facility];  Service: Orthopedics;  Laterality: Bilateral;   CHOLECYSTECTOMY  1994   CHONDROPLASTY Right 08/31/2020   Procedure: CHONDROPLASTY;  Surgeon: Shari Sieving, MD;  Location: [facility];  Service: Orthopedics;  Laterality: Right;   COLONOSCOPY WITH PROPOFOL  N/A 01/21/2020   Procedure: COLONOSCOPY WITH PROPOFOL ;  Surgeon: Shila Gustav GAILS, MD;  Location: [facility];  Service: Endoscopy;  Laterality: N/A;   DILATION AND CURETTAGE OF UTERUS     ESOPHAGOGASTRODUODENOSCOPY (EGD) WITH PROPOFOL  N/A 01/21/2020   Procedure: ESOPHAGOGASTRODUODENOSCOPY (EGD) WITH PROPOFOL ;  Surgeon: Shila Gustav GAILS, MD;  [facility];  Service: Endoscopy;  Laterality: N/A;   KNEE ARTHROSCOPY WITH ANTERIOR CRUCIATE LIGAMENT (ACL) REPAIR Right 08/31/2020   Procedure: RIGHT KNEE ARTHROSCOPY WITH ANTERIOR CRUCIATE LIGAMENT (ACL) REPAIR MEDIAL AND LATERAL  MENISECTOMY;  Surgeon: Shari Sieving, MD;  Location: [facility];  Service: Orthopedics;  Laterality: Right;  FEMORAL NERVE BLOCK   LAPAROTOMY N/A 10/10/2023   Procedure: EXPLORATORY LAPAROTOMY, PARTIAL COLECTOMY AND COLOSTOMY;  Surgeon: Teresa Lonni HERO, MD;  Location: [facility];  Service: General;  Laterality: N/A;  EXPLORATORY LAPAROTOMY , PARTIAL COLECTOMY AND COLOSTOMY   POLYPECTOMY  01/21/2020   Procedure: POLYPECTOMY;  Surgeon: Shila Gustav GAILS, MD;  Location[facility];  Service: Endoscopy;;   TUBAL LIGATION  2004    Current Outpatient Medications  Medication Sig Dispense Refill   acetaminophen  (TYLENOL ) 500 MG tablet Take 1 tablet (500 mg total) by mouth every 8 (eight) hours as needed for moderate pain (pain score 4-6) or mild pain (pain score 1-3). 30 tablet 0   albuterol  (VENTOLIN  HFA) 108 (90 Base) MCG/ACT inhaler INHALE 2 PUFFS INTO THE LUNGS TWICE A DAY 18 each 11    ASPIRIN  LOW DOSE 81 MG EC tablet TAKE 1 TABLET BY MOUTH 2 TIMES DAILY. 180 tablet 1   calcium  carbonate (TUMS) 500 MG chewable tablet Chew 1 tablet (200 mg of elemental calcium  total) by mouth daily. 30 tablet 0   Cholecalciferol 1.25 MG (50000 UT) TABS Take 1 tablet by mouth once a week. For 8 weeks and then stop.  Return to office for lab draw. 8 tablet 0   citalopram  (CELEXA ) 40 MG tablet TAKE 1 TABLET BY MOUTH EVERY DAY 90 tablet 1   EPINEPHrine  (EPIPEN  2-PAK) 0.3 mg/0.3 mL IJ SOAJ injection Inject 0.3 mg into the muscle as needed for anaphylaxis. 1 each 3   Fluticasone -Umeclidin-Vilant (TRELEGY ELLIPTA ) 200-62.5-25 MCG/ACT AEPB Take 1 puff by mouth daily. 60 each 11   folic acid  (FOLVITE ) 1 MG tablet Take 1 tablet (1 mg total) by mouth daily. 30 tablet 0   LORazepam  (ATIVAN ) 0.5 MG tablet Take 1 tablet (0.5 mg total) by mouth 2 (two) times daily as needed for anxiety. 30 tablet 0   Magnesium  Chloride 64 MG TABS Take 2 tablets by mouth in the morning, at noon, and at bedtime. 30 tablet 0   midodrine  (PROAMATINE ) 5 MG tablet Take 1 tablet (5 mg total) by mouth 2 (two) times daily with a meal. 180 tablet 0   nicotine  (NICODERM CQ  - DOSED IN MG/24 HOURS) 14 mg/24hr patch Place 1 patch (14 mg total) onto the skin daily. 28 patch 0   omeprazole  (PRILOSEC) 40 MG capsule Take 1 capsule (40 mg total) by mouth 2 (two) times daily. Take 30 mins. Prior to breakfast and supper 60 capsule 11   ondansetron  (ZOFRAN ) 8 MG tablet Take 1 tablet (8 mg total) by mouth every 8 (eight) hours as needed for nausea or vomiting. 30 tablet 1   oxyCODONE  (ROXICODONE ) 5 MG immediate release tablet Take 1 tablet (5 mg total) by mouth every 4 (four) hours as needed for severe pain (pain score 7-10). (Patient not taking: Reported on 02/19/2024) 12 tablet 0   potassium chloride  SA (KLOR-CON  M) 20 MEQ tablet Take 1 tablet (20 mEq total) by mouth daily. 90 tablet 3   promethazine  (PHENERGAN ) 25 MG tablet Take 1 tablet (25 mg  total) by mouth every 8 (eight) hours as needed for nausea or vomiting. 20 tablet 0   rizatriptan  (MAXALT ) 10 MG tablet TAKE 1 TABLET BY MOUTH AS NEEDED FOR MIGRAINE. MAY REPEAT IN 2 HOURS IF NEEDED STRENGTH: 10 MG 10 tablet 0   rOPINIRole  (REQUIP ) 2 MG tablet TAKE 1/2 TO 1 TABLETS BY MOUTH AT BEDTIME. 90 tablet 3   rosuvastatin  (CRESTOR ) 20 MG tablet Take 1 tablet (20 mg total) by mouth daily. 90 tablet 1   thiamine  (VITAMIN B1) 100 MG tablet Take 1 tablet (100 mg total) by mouth daily. 30 tablet 0   No current facility-administered medications for this visit.    Allergies as of 04/02/2024 - Review  Complete 02/19/2024  Allergen Reaction Noted   Jasmine oil Shortness Of Breath and Swelling 02/12/2013   Levaquin  [levofloxacin ] Shortness Of Breath, Itching, Swelling, and Rash 04/09/2019   Penicillins Anaphylaxis and Hives 02/12/2013   Klonopin  [clonazepam ]  11/27/2023   Aleve [naproxen sodium] Itching 02/12/2013   Chlorhexidine  Other (See Comments) 04/27/2021   Depo-provera [medroxyprogesterone acetate] Other (See Comments) 02/12/2013   Doxycycline  Other (See Comments) 01/14/2018   Tramadol Other (See Comments) 02/12/2013    Family History  Problem Relation Age of Onset   Brain cancer Mother 39   Lung cancer Mother 13   Breast cancer Sister 25   Ovarian cancer Sister 72   Colon cancer Neg Hx    Colon polyps Neg Hx    Esophageal cancer Neg Hx    Rectal cancer Neg Hx    Stomach cancer Neg Hx     Social History   Socioeconomic History   Marital status: Married    Spouse name: Lemond   Number of children: 3   Years of education: 12th grade   Highest education level: Not on file  Occupational History   Occupation: Multimedia Programmer: [employer]  Tobacco Use   Smoking status: Former    Current packs/day: 1.00    Average packs/day: 1 pack/day for 34.0 years (34.0 ttl pk-yrs)    Types: Cigarettes   Smokeless tobacco: Never   Tobacco comments:    6 cigarettes a day    Vaping Use   Vaping status: Never Used  Substance and Sexual Activity   Alcohol use: Yes    Comment: everyday   Drug use: No   Sexual activity: Yes    Birth control/protection: Surgical  Other Topics Concern   Not on file  Social History Narrative   Lives with her husband.   Their 3 children live independently.   Social Drivers of Corporate Investment Banker Strain: Low Risk  (11/07/2023)   Overall Financial Resource Strain (CARDIA)    Difficulty of Paying Living Expenses: Not hard at all  Food Insecurity: No Food Insecurity (11/07/2023)   Hunger Vital Sign    Worried About Running Out of Food in the Last Year: Never true    Ran Out of Food in the Last Year: Never true  Transportation Needs: No Transportation Needs (11/07/2023)   PRAPARE - Administrator, Civil Service (Medical): No    Lack of Transportation (Non-Medical): No  Physical Activity: Inactive (11/07/2023)   Exercise Vital Sign    Days of Exercise per Week: 0 days    Minutes of Exercise per Session: 0 min  Stress: Stress Concern Present (11/07/2023)   [employer] of Occupational Health - Occupational Stress Questionnaire    Feeling of Stress: To some extent  Social Connections: Moderately Integrated (11/07/2023)   Social Connection and Isolation Panel    Frequency of Communication with Friends and Family: More than three times a week    Frequency of Social Gatherings with Friends and Family: Twice a week    Attends Religious Services: More than 4 times per year    Active Member of [employer] or Organizations: No    Attends Banker Meetings: Never    Marital Status: Married  Catering Manager Violence: Not At Risk (11/07/2023)   Humiliation, Afraid, Rape, and Kick questionnaire    Fear of Current or Ex-Partner: No    Emotionally Abused: No    Physically Abused: No    Sexually Abused: No  Review of Systems:    Constitutional: No weight loss, fever, chills, weakness or fatigue HEENT:  Eyes: No change in vision               Ears, Nose, Throat:  No change in hearing or congestion Skin: No rash or itching Cardiovascular: No chest pain, chest pressure or palpitations   Respiratory: No SOB or cough Gastrointestinal: See HPI and otherwise negative Genitourinary: No dysuria or change in urinary frequency Neurological: No headache, dizziness or syncope Musculoskeletal: No new muscle or joint pain Hematologic: No bleeding or bruising Psychiatric: No history of depression or anxiety    Physical Exam:  Vital signs: There were no vitals taken for this visit.  Constitutional:   Pleasant Caucasian female appears to be in NAD, Well developed, Well nourished, alert and cooperative Head:  Normocephalic and atraumatic. Eyes:   PEERL, EOMI. No icterus. Conjunctiva pink. Ears:  Normal auditory acuity. Neck:  Supple Throat: Oral cavity and pharynx without inflammation, swelling or lesion.  Respiratory: Respirations even and unlabored. Lungs clear to auscultation bilaterally.   No wheezes, crackles, or rhonchi.  Cardiovascular: Normal S1, S2. No MRG. Regular rate and rhythm. No peripheral edema, cyanosis or pallor.  Gastrointestinal:  Soft, nondistended, nontender. No rebound or guarding. Normal bowel sounds. No appreciable masses or hepatomegaly. Rectal:  Not performed.  Msk:  Symmetrical without gross deformities. Without edema, no deformity or joint abnormality.  Neurologic:  Alert and  oriented x4;  grossly normal neurologically.  Skin:   Dry and intact without significant lesions or rashes. Psychiatric: Oriented to person, place and time. Demonstrates good judgement and reason without abnormal affect or behaviors.  RELEVANT LABS AND IMAGING: CBC    Component Value Date/Time   WBC 7.4 02/19/2024 1451   RBC 4.29 02/19/2024 1451   HGB 13.7 02/19/2024 1451   HGB 15.4 10/07/2020 0925   HCT 42.4 02/19/2024 1451   HCT 46.4 10/07/2020 0925   PLT 190 02/19/2024 1451   PLT 149  (L) 08/17/2019 1238   MCV 98.8 02/19/2024 1451   MCV 95 10/07/2020 0925   MCH 31.9 02/19/2024 1451   MCHC 32.3 02/19/2024 1451   RDW 12.4 02/19/2024 1451   RDW 12.5 10/07/2020 0925   LYMPHSABS 1.6 11/27/2023 1716   LYMPHSABS 5.0 (H) 10/07/2020 0925   MONOABS 0.9 11/27/2023 1716   EOSABS 370 02/19/2024 1451   EOSABS 0.1 10/07/2020 0925   BASOSABS 37 02/19/2024 1451   BASOSABS 0.0 10/07/2020 0925    CMP     Component Value Date/Time   NA 143 02/19/2024 1451   NA 140 10/07/2020 0925   K 4.3 02/19/2024 1451   CL 104 02/19/2024 1451   CO2 33 (H) 02/19/2024 1451   GLUCOSE 100 (H) 02/19/2024 1451   BUN 9 02/19/2024 1451   BUN 12 10/07/2020 0925   CREATININE 0.55 02/19/2024 1451   CALCIUM  8.9 02/19/2024 1451   PROT 6.7 02/19/2024 1451   PROT 7.0 10/07/2020 0925   ALBUMIN  2.9 (L) 11/27/2023 1716   ALBUMIN  4.3 10/07/2020 0925   AST 48 (H) 02/19/2024 1451   ALT 13 02/19/2024 1451   ALKPHOS 152 (H) 11/27/2023 1716   BILITOT 0.6 02/19/2024 1451   BILITOT 0.2 10/07/2020 0925   GFRNONAA >60 11/27/2023 1716   GFRAA 81 08/17/2019 1238    Assessment: 1. ***  Plan: 1. ***     Delon Failing, PA-C [facility] 03/31/2024, 4:03 PM  Cc: Kayla Jeoffrey RAMAN, FNP

## 2024-04-02 ENCOUNTER — Ambulatory Visit: Admitting: Physician Assistant

## 2024-04-20 ENCOUNTER — Telehealth: Payer: Self-pay | Admitting: Family Medicine

## 2024-04-20 DIAGNOSIS — F419 Anxiety disorder, unspecified: Secondary | ICD-10-CM

## 2024-04-20 DIAGNOSIS — J449 Chronic obstructive pulmonary disease, unspecified: Secondary | ICD-10-CM

## 2024-04-20 NOTE — Telephone Encounter (Unsigned)
 Copied from CRM #[id number]. Topic: Clinical - Medication Refill >> Apr 20, 2024  3:39 PM Kendralyn S wrote: Medication:  albuterol  (VENTOLIN  HFA) 108 (90 Base) MCG/ACT inhaler LORazepam  (ATIVAN ) 0.5 MG tablet  Has the patient contacted their pharmacy? Yes (Agent: If no, request that the patient contact the pharmacy for the refill. If patient does not wish to contact the pharmacy document the reason why and proceed with request.) (Agent: If yes, when and what did the pharmacy advise?)  This is the patient's preferred pharmacy:  [id number] [place], [place] - [address] [place] [address] Phone: [phone] Fax: [phone]  [facility] [address] [place] [address] Phone: [phone] Fax: [phone]  [facility] - [address] [place] - [address] [place] [address] Phone: [phone] Fax: [phone]  Is this the correct pharmacy for this prescription? Yes If no, delete pharmacy and type the correct one.   Has the prescription been filled recently? No  Is the patient out of the medication? Yes  Has the patient been seen for an appointment in the last year OR does the patient have an upcoming appointment? Yes  Can we respond through MyChart? Yes  Agent: Please be advised that Rx refills may take up to 3 business days. We ask that you follow-up with your pharmacy.

## 2024-04-21 NOTE — Telephone Encounter (Signed)
 Rn attempted to call pt to confirm pharmacy as there are 3 listed. LVM for pt to return call.

## 2024-04-22 NOTE — Telephone Encounter (Signed)
 Rx 07/15/23 18ea 11RF Requested Prescriptions  Pending Prescriptions Disp Refills   albuterol  (VENTOLIN  HFA) 108 (90 Base) MCG/ACT inhaler 18 each 11    Sig: INHALE 2 PUFFS INTO THE LUNGS TWICE A DAY     Pulmonology:  Beta Agonists 2 Passed - 04/22/2024  2:55 PM      Passed - Last BP in normal range    BP Readings from Last 1 Encounters:  02/19/24 122/78         Passed - Last Heart Rate in normal range    Pulse Readings from Last 1 Encounters:  02/19/24 (!) 108         Passed - Valid encounter within last 12 months    Recent Outpatient Visits           2 months ago Hyperlipidemia, unspecified hyperlipidemia type   [place] [facility] Kayla Jeoffrey RAMAN, FNP   2 months ago Viral gastroenteritis   [place] [facility] Kayla Jeoffrey RAMAN, FNP   5 months ago Nausea and vomiting, unspecified vomiting type   [place] [facility] Aletha Bene, MD   5 months ago Physical exam, annual   [place] [facility] Kayla Jeoffrey RAMAN, FNP   6 months ago Sepsis, due to unspecified organism, unspecified whether acute organ dysfunction present [facility])   [place] [facility] Aletha Bene, MD               LORazepam  (ATIVAN ) 0.5 MG tablet 30 tablet 0    Sig: Take 1 tablet (0.5 mg total) by mouth 2 (two) times daily as needed for anxiety.     Not Delegated - Psychiatry: Anxiolytics/Hypnotics 2 Failed - 04/22/2024  2:55 PM      Failed - This refill cannot be delegated      Failed - Urine Drug Screen completed in last 360 days      Passed - Patient is not pregnant      Passed - Valid encounter within last 6 months    Recent Outpatient Visits           2 months ago Hyperlipidemia, unspecified hyperlipidemia type   [place] [facility] Kayla Jeoffrey RAMAN, FNP   2 months ago Viral gastroenteritis   [place] [facility] Kayla Jeoffrey RAMAN, FNP   5 months ago  Nausea and vomiting, unspecified vomiting type   [place] [facility] Medicine Aletha Bene, MD   5 months ago Physical exam, annual   [place] [facility] Kayla Jeoffrey RAMAN, FNP   6 months ago Sepsis, due to unspecified organism, unspecified whether acute organ dysfunction present [facility])   [place] [facility] Medicine Aletha Bene, MD

## 2024-04-22 NOTE — Telephone Encounter (Signed)
 Requested medication (s) are due for refill today: Yes  Requested medication (s) are on the active medication list: Yes  Last refill:  03/20/24  Future visit scheduled: Yes  Notes to clinic:  Not delegated.    Requested Prescriptions  Pending Prescriptions Disp Refills   LORazepam  (ATIVAN ) 0.5 MG tablet 30 tablet 0    Sig: Take 1 tablet (0.5 mg total) by mouth 2 (two) times daily as needed for anxiety.     Not Delegated - Psychiatry: Anxiolytics/Hypnotics 2 Failed - 04/22/2024  3:23 PM      Failed - This refill cannot be delegated      Failed - Urine Drug Screen completed in last 360 days      Passed - Patient is not pregnant      Passed - Valid encounter within last 6 months    Recent Outpatient Visits           2 months ago Hyperlipidemia, unspecified hyperlipidemia type   [place] [facility] Kayla Jeoffrey RAMAN, FNP   2 months ago Viral gastroenteritis   [place] [facility] Kayla Jeoffrey RAMAN, FNP   5 months ago Nausea and vomiting, unspecified vomiting type   [place] [facility] Medicine Aletha Bene, MD   5 months ago Physical exam, annual   [place] [facility] Kayla Jeoffrey RAMAN, FNP   6 months ago Sepsis, due to unspecified organism, unspecified whether acute organ dysfunction present [facility])   [place] [facility] Aletha Bene, MD              Refused Prescriptions Disp Refills   albuterol  (VENTOLIN  HFA) 108 (90 Base) MCG/ACT inhaler 18 each 11    Sig: INHALE 2 PUFFS INTO THE LUNGS TWICE A DAY     Pulmonology:  Beta Agonists 2 Passed - 04/22/2024  3:23 PM      Passed - Last BP in normal range    BP Readings from Last 1 Encounters:  02/19/24 122/78         Passed - Last Heart Rate in normal range    Pulse Readings from Last 1 Encounters:  02/19/24 (!) 108         Passed - Valid encounter within last 12 months    Recent Outpatient Visits           2 months  ago Hyperlipidemia, unspecified hyperlipidemia type   [place] [facility] Medicine Kayla Jeoffrey RAMAN, FNP   2 months ago Viral gastroenteritis   [place] [facility] Kayla Jeoffrey RAMAN, FNP   5 months ago Nausea and vomiting, unspecified vomiting type   [place] [facility] Aletha Bene, MD   5 months ago Physical exam, annual   [place] [facility] Medicine Kayla Jeoffrey RAMAN, FNP   6 months ago Sepsis, due to unspecified organism, unspecified whether acute organ dysfunction present [facility])   [place] [facility] Medicine Aletha Bene, MD

## 2024-04-22 NOTE — Telephone Encounter (Signed)
 Requested medication (s) are due for refill today - yes  Requested medication (s) are on the active medication list -yes  Future visit scheduled -yes  Last refill: 03/20/24 #30  Notes to clinic: non delegated Rx  Requested Prescriptions  Pending Prescriptions Disp Refills   LORazepam  (ATIVAN ) 0.5 MG tablet 30 tablet 0    Sig: Take 1 tablet (0.5 mg total) by mouth 2 (two) times daily as needed for anxiety.     Not Delegated - Psychiatry: Anxiolytics/Hypnotics 2 Failed - 04/22/2024  2:57 PM      Failed - This refill cannot be delegated      Failed - Urine Drug Screen completed in last 360 days      Passed - Patient is not pregnant      Passed - Valid encounter within last 6 months    Recent Outpatient Visits           2 months ago Hyperlipidemia, unspecified hyperlipidemia type   [place] [facility] Kayla Jeoffrey RAMAN, FNP   2 months ago Viral gastroenteritis   [place] [facility] Kayla Jeoffrey RAMAN, FNP   5 months ago Nausea and vomiting, unspecified vomiting type   [place] [facility] Aletha Bene, MD   5 months ago Physical exam, annual   [place] [facility] Kayla Jeoffrey RAMAN, FNP   6 months ago Sepsis, due to unspecified organism, unspecified whether acute organ dysfunction present [facility]   [place] [facility] Aletha Bene, MD              Refused Prescriptions Disp Refills   albuterol  (VENTOLIN  HFA) 108 (90 Base) MCG/ACT inhaler 18 each 11    Sig: INHALE 2 PUFFS INTO THE LUNGS TWICE A DAY     Pulmonology:  Beta Agonists 2 Passed - 04/22/2024  2:57 PM      Passed - Last BP in normal range    BP Readings from Last 1 Encounters:  02/19/24 122/78         Passed - Last Heart Rate in normal range    Pulse Readings from Last 1 Encounters:  02/19/24 (!) 108         Passed - Valid encounter within last 12 months    Recent Outpatient Visits           2 months  ago Hyperlipidemia, unspecified hyperlipidemia type   [place] [facility] Kayla Jeoffrey RAMAN, FNP   2 months ago Viral gastroenteritis   [place] [facility] Kayla Jeoffrey S, FNP   5 months ago Nausea and vomiting, unspecified vomiting type   [place] [facility] Aletha Bene, MD   5 months ago Physical exam, annual   [place] [facility] Kayla Jeoffrey RAMAN, FNP   6 months ago Sepsis, due to unspecified organism, unspecified whether acute organ dysfunction present [facility])    [facility] Aletha Bene, MD                 Requested Prescriptions  Pending Prescriptions Disp Refills   LORazepam  (ATIVAN ) 0.5 MG tablet 30 tablet 0    Sig: Take 1 tablet (0.5 mg total) by mouth 2 (two) times daily as needed for anxiety.     Not Delegated - Psychiatry: Anxiolytics/Hypnotics 2 Failed - 04/22/2024  2:57 PM      Failed - This refill cannot be delegated  Failed - Urine Drug Screen completed in last 360 days      Passed - Patient is not pregnant      Passed - Valid encounter within last 6 months    Recent Outpatient Visits           2 months ago Hyperlipidemia, unspecified hyperlipidemia type   [place] [facility] Kayla Jeoffrey RAMAN, FNP   2 months ago Viral gastroenteritis   [place] [facility] Kayla Jeoffrey RAMAN, FNP   5 months ago Nausea and vomiting, unspecified vomiting type   [place] [facility] Aletha Bene, MD   5 months ago Physical exam, annual   [place] [facility] Kayla Jeoffrey RAMAN, FNP   6 months ago Sepsis, due to unspecified organism, unspecified whether acute organ dysfunction present [facility])   [place] [facility] Aletha Bene, MD              Refused Prescriptions Disp Refills   albuterol  (VENTOLIN  HFA) 108 (90 Base) MCG/ACT inhaler 18 each 11    Sig:  INHALE 2 PUFFS INTO THE LUNGS TWICE A DAY     Pulmonology:  Beta Agonists 2 Passed - 04/22/2024  2:57 PM      Passed - Last BP in normal range    BP Readings from Last 1 Encounters:  02/19/24 122/78         Passed - Last Heart Rate in normal range    Pulse Readings from Last 1 Encounters:  02/19/24 (!) 108         Passed - Valid encounter within last 12 months    Recent Outpatient Visits           2 months ago Hyperlipidemia, unspecified hyperlipidemia type   [place] [facility] Kayla Jeoffrey RAMAN, FNP   2 months ago Viral gastroenteritis   [place] [facility] Kayla Jeoffrey RAMAN, FNP   5 months ago Nausea and vomiting, unspecified vomiting type   [place] [facility] Aletha Bene, MD   5 months ago Physical exam, annual   [place] [facility] Kayla Jeoffrey RAMAN, FNP   6 months ago Sepsis, due to unspecified organism, unspecified whether acute organ dysfunction present [facility])    [facility] Aletha Bene, MD

## 2024-04-23 MED ORDER — LORAZEPAM 0.5 MG PO TABS: 0.5000 mg | ORAL_TABLET | Freq: Two times a day (BID) | ORAL | 0 refills | Status: AC | PRN

## 2024-04-30 ENCOUNTER — Other Ambulatory Visit

## 2024-05-04 ENCOUNTER — Other Ambulatory Visit: Payer: Self-pay | Admitting: Student

## 2024-05-04 DIAGNOSIS — R1031 Right lower quadrant pain: Secondary | ICD-10-CM

## 2024-05-11 ENCOUNTER — Telehealth: Payer: Self-pay

## 2024-05-11 ENCOUNTER — Other Ambulatory Visit

## 2024-05-11 DIAGNOSIS — E785 Hyperlipidemia, unspecified: Secondary | ICD-10-CM

## 2024-05-11 DIAGNOSIS — R112 Nausea with vomiting, unspecified: Secondary | ICD-10-CM

## 2024-05-11 NOTE — Telephone Encounter (Signed)
 Pt came into office to request refills of these meds:  promethazine  (PHENERGAN ) 25 MG tablet [[id number]] ondansetron  (ZOFRAN ) 8 MG tablet [[id number]] rizatriptan  (MAXALT ) 10 MG tablet [[id number]]  LOV: 02/19/24  PHARMACY:  [id number] [place], [place] - [address] [place]  [address] Phone: [phone]  Fax: [phone] DEA #: [id number]    CB#: [phone]

## 2024-05-12 LAB — VITAMIN D 25 HYDROXY (VIT D DEFICIENCY, FRACTURES): Vit D, 25-Hydroxy: 12 ng/mL — ABNORMAL LOW (ref 30–100)

## 2024-05-12 LAB — LIPID PANEL
Cholesterol: 260 mg/dL — ABNORMAL HIGH
HDL: 138 mg/dL
LDL Cholesterol (Calc): 102 mg/dL — ABNORMAL HIGH
Non-HDL Cholesterol (Calc): 122 mg/dL
Total CHOL/HDL Ratio: 1.9 (calc)
Triglycerides: 100 mg/dL

## 2024-05-13 ENCOUNTER — Ambulatory Visit
Admission: RE | Admit: 2024-05-13 | Discharge: 2024-05-13 | Disposition: A | Discharge status: Home / Self Care | Source: Ambulatory Visit | Attending: Student | Admitting: Student

## 2024-05-13 DIAGNOSIS — R1031 Right lower quadrant pain: Secondary | ICD-10-CM

## 2024-05-13 MED ORDER — IOPAMIDOL (ISOVUE-370) INJECTION 76%
80.0000 mL | Freq: Once | INTRAVENOUS | Status: AC | PRN
  Administered 2024-05-13: 80 mL via INTRAVENOUS

## 2024-05-15 LAB — MAGNESIUM: Magnesium: 1.1 mg/dL — ABNORMAL LOW (ref 1.5–2.5)

## 2024-05-19 ENCOUNTER — Ambulatory Visit: Payer: Self-pay | Admitting: Family Medicine

## 2024-05-19 ENCOUNTER — Other Ambulatory Visit: Payer: Self-pay | Admitting: Family Medicine

## 2024-05-19 DIAGNOSIS — G43909 Migraine, unspecified, not intractable, without status migrainosus: Secondary | ICD-10-CM

## 2024-05-19 DIAGNOSIS — R112 Nausea with vomiting, unspecified: Secondary | ICD-10-CM

## 2024-05-19 DIAGNOSIS — F32A Depression, unspecified: Secondary | ICD-10-CM

## 2024-05-20 ENCOUNTER — Other Ambulatory Visit: Payer: Self-pay | Admitting: Family Medicine

## 2024-05-20 ENCOUNTER — Other Ambulatory Visit: Payer: Self-pay

## 2024-05-20 DIAGNOSIS — R112 Nausea with vomiting, unspecified: Secondary | ICD-10-CM

## 2024-05-20 NOTE — Telephone Encounter (Signed)
 Copied from CRM [id number]. Topic: Clinical - Medication Refill >> May 20, 2024  3:48 PM Santiya F wrote: Medication: ondansetron  (ZOFRAN ) 8 MG tablet [[id number]]  Has the patient contacted their pharmacy? Yes  (Agent: If yes, when and what did the pharmacy advise?) contact office   This is the patient's preferred pharmacy:  [id number] [place], [place] - [address] [place] [address] Phone: [phone] Fax: [phone]  Is this the correct pharmacy for this prescription? Yes If no, delete pharmacy and type the correct one.   Has the prescription been filled recently? Yes  Is the patient out of the medication? Yes  Has the patient been seen for an appointment in the last year OR does the patient have an upcoming appointment? Yes  Can we respond through MyChart? Yes  Agent: Please be advised that Rx refills may take up to 3 business days. We ask that you follow-up with your pharmacy.  Patient's husband is requesting this be sent over today because patient is out of the medication and is feeling nauseas. He is requesting a call when it's sent. Ph: [phone]

## 2024-05-21 ENCOUNTER — Ambulatory Visit: Admitting: Family Medicine

## 2024-05-21 MED ORDER — ONDANSETRON HCL 8 MG PO TABS: 8.0000 mg | ORAL_TABLET | Freq: Three times a day (TID) | ORAL | 1 refills | Status: AC | PRN

## 2024-05-21 NOTE — Telephone Encounter (Signed)
 Requested medications are due for refill today.  Klonopin  is d/c'd. Zofran  is due.  Requested medications are on the active medications list.  yes  Last refill. Zofran  11/22/2023 #30 0 rf  Future visit scheduled.   yes  Notes to clinic.  Refill/ refusal not delegated.    Requested Prescriptions  Pending Prescriptions Disp Refills   clonazePAM  (KLONOPIN ) 0.5 MG tablet [Pharmacy Med Name: CLONAZEPAM  0.5 MG TABLET] 30 tablet 0    Sig: TAKE 1 TABLET BY MOUTH 2 TIMES DAILY AS NEEDED FOR ANXIETY.     Not Delegated - Psychiatry: Anxiolytics/Hypnotics 2 Failed - 05/21/2024 10:07 AM      Failed - This refill cannot be delegated      Failed - Urine Drug Screen completed in last 360 days      Passed - Patient is not pregnant      Passed - Valid encounter within last 6 months    Recent Outpatient Visits           3 months ago Hyperlipidemia, unspecified hyperlipidemia type   [place] [facility] Kayla Jeoffrey RAMAN, FNP   3 months ago Viral gastroenteritis   [place] [facility] Kayla Jeoffrey RAMAN, FNP   6 months ago Nausea and vomiting, unspecified vomiting type   [place] [facility] Aletha Bene, MD   6 months ago Physical exam, annual   [place] [facility] Kayla Jeoffrey RAMAN, FNP   6 months ago Sepsis, due to unspecified organism, unspecified whether acute organ dysfunction present [facility])   [place] [facility] Aletha Bene, MD               ondansetron  (ZOFRAN ) 8 MG tablet [Pharmacy Med Name: ONDANSETRON  HCL 8 MG TABLET] 30 tablet 1    Sig: TAKE 1 TABLET BY MOUTH EVERY 8 HOURS AS NEEDED FOR NAUSEA OR VOMITING.     Not Delegated - Gastroenterology: Antiemetics - ondansetron  Failed - 05/21/2024 10:07 AM      Failed - This refill cannot be delegated      Failed - AST in normal range and within 360 days    AST  Date Value Ref Range Status  02/19/2024 48 (H) 10 - 35 U/L Final          Passed - ALT in normal range and within 360 days    ALT  Date Value Ref Range Status  02/19/2024 13 6 - 29 U/L Final         Passed - Valid encounter within last 6 months    Recent Outpatient Visits           3 months ago Hyperlipidemia, unspecified hyperlipidemia type   [place] [facility] Kayla Jeoffrey RAMAN, FNP   3 months ago Viral gastroenteritis   [place] [facility] Kayla Jeoffrey RAMAN, FNP   6 months ago Nausea and vomiting, unspecified vomiting type   [place] [facility] Aletha Bene, MD   6 months ago Physical exam, annual   [place] [facility] Kayla Jeoffrey RAMAN, FNP   6 months ago Sepsis, due to unspecified organism, unspecified whether acute organ dysfunction present [facility])   [place] [facility] Aletha Bene, MD

## 2024-05-22 NOTE — Progress Notes (Unsigned)
 "  Chief Complaint:  HPI:  *** is a  ***  who was referred to me by Kayla Jeoffrey RAMAN, FNP for a complaint of *** .     Past Medical History:  Diagnosis Date   Allergy    Anxiety    Arthritis    Asthma    CAP (community acquired pneumonia) 04/07/2019   Carpal tunnel syndrome    Cataract    COPD (chronic obstructive pulmonary disease) (HCC)    Depression    Diverticulitis    Emphysema of lung (HCC)    Essential hypertension, benign 07/14/2019   Hyperlipidemia    Oxygen deficiency    Oxygen dependent    on 5 L cont [place]    Past Surgical History:  Procedure Laterality Date   ABDOMINAL HYSTERECTOMY  2009   APPENDECTOMY     ARTHROSCOPIC REPAIR ACL Left 2011/2014   x 2   BIOPSY  01/21/2020   Procedure: BIOPSY;  Surgeon: Shila Gustav GAILS, MD;  Location: [facility];  Service: Endoscopy;;  EGD and COLON   BUNIONECTOMY  2006   both   CARPAL TUNNEL RELEASE Bilateral 10/28/2013   Procedure: BILATERAL CARPAL TUNNEL RELEASE;  Surgeon: Arley JONELLE Curia, MD;  Location: [facility];  Service: Orthopedics;  Laterality: Bilateral;   CHOLECYSTECTOMY  1994   CHONDROPLASTY Right 08/31/2020   Procedure: CHONDROPLASTY;  Surgeon: Shari Sieving, MD;  Location: [facility];  Service: Orthopedics;  Laterality: Right;   COLONOSCOPY WITH PROPOFOL  N/A 01/21/2020   Procedure: COLONOSCOPY WITH PROPOFOL ;  Surgeon: Shila Gustav GAILS, MD;  Location: [facility];  Service: Endoscopy;  Laterality: N/A;   DILATION AND CURETTAGE OF UTERUS     ESOPHAGOGASTRODUODENOSCOPY (EGD) WITH PROPOFOL  N/A 01/21/2020   Procedure: ESOPHAGOGASTRODUODENOSCOPY (EGD) WITH PROPOFOL ;  Surgeon: Shila Gustav GAILS, MD;  Location: [facility];  Service: Endoscopy;  Laterality: N/A;   KNEE ARTHROSCOPY WITH ANTERIOR CRUCIATE LIGAMENT (ACL) REPAIR Right 08/31/2020   Procedure: RIGHT KNEE ARTHROSCOPY WITH ANTERIOR CRUCIATE LIGAMENT (ACL) REPAIR MEDIAL AND LATERAL  MENISECTOMY;  Surgeon: Shari Sieving, MD;   Location: [facility];  Service: Orthopedics;  Laterality: Right;  FEMORAL NERVE BLOCK   LAPAROTOMY N/A 10/10/2023   Procedure: EXPLORATORY LAPAROTOMY, PARTIAL COLECTOMY AND COLOSTOMY;  Surgeon: Teresa Lonni HERO, MD;  Location: [facility];  Service: General;  Laterality: N/A;  EXPLORATORY LAPAROTOMY , PARTIAL COLECTOMY AND COLOSTOMY   POLYPECTOMY  01/21/2020   Procedure: POLYPECTOMY;  Surgeon: Shila Gustav GAILS, MD;  Location: [facility];  Service: Endoscopy;;   TUBAL LIGATION  2004    Current Outpatient Medications  Medication Sig Dispense Refill   acetaminophen  (TYLENOL ) 500 MG tablet Take 1 tablet (500 mg total) by mouth every 8 (eight) hours as needed for moderate pain (pain score 4-6) or mild pain (pain score 1-3). 30 tablet 0   albuterol  (VENTOLIN  HFA) 108 (90 Base) MCG/ACT inhaler INHALE 2 PUFFS INTO THE LUNGS TWICE A DAY 18 each 11   ASPIRIN  LOW DOSE 81 MG EC tablet TAKE 1 TABLET BY MOUTH 2 TIMES DAILY. 180 tablet 1   calcium  carbonate (TUMS) 500 MG chewable tablet Chew 1 tablet (200 mg of elemental calcium  total) by mouth daily. 30 tablet 0   Cholecalciferol  1.25 MG (50000 UT) TABS Take 1 tablet by mouth once a week. For 8 weeks and then stop.  Return to office for lab draw. 8 tablet 0   citalopram  (CELEXA ) 40 MG tablet TAKE 1 TABLET BY MOUTH EVERY DAY 90 tablet 1  EPINEPHrine  (EPIPEN  2-PAK) 0.3 mg/0.3 mL IJ SOAJ injection Inject 0.3 mg into the muscle as needed for anaphylaxis. 1 each 3   Fluticasone -Umeclidin-Vilant (TRELEGY ELLIPTA ) 200-62.5-25 MCG/ACT AEPB Take 1 puff by mouth daily. 60 each 11   folic acid  (FOLVITE ) 1 MG tablet Take 1 tablet (1 mg total) by mouth daily. 30 tablet 0   LORazepam  (ATIVAN ) 0.5 MG tablet Take 1 tablet (0.5 mg total) by mouth 2 (two) times daily as needed for anxiety. 30 tablet 0   Magnesium  Chloride 64 MG TABS Take 2 tablets by mouth in the morning, at noon, and at bedtime. 30 tablet 0   midodrine  (PROAMATINE ) 5 MG tablet Take 1 tablet (5  mg total) by mouth 2 (two) times daily with a meal. 180 tablet 0   nicotine  (NICODERM CQ  - DOSED IN MG/24 HOURS) 14 mg/24hr patch Place 1 patch (14 mg total) onto the skin daily. 28 patch 0   omeprazole  (PRILOSEC) 40 MG capsule Take 1 capsule (40 mg total) by mouth 2 (two) times daily. Take 30 mins. Prior to breakfast and supper 60 capsule 11   ondansetron  (ZOFRAN ) 8 MG tablet Take 1 tablet (8 mg total) by mouth every 8 (eight) hours as needed for nausea or vomiting. 30 tablet 1   oxyCODONE  (ROXICODONE ) 5 MG immediate release tablet Take 1 tablet (5 mg total) by mouth every 4 (four) hours as needed for severe pain (pain score 7-10). (Patient not taking: Reported on 02/19/2024) 12 tablet 0   potassium chloride  SA (KLOR-CON  M) 20 MEQ tablet Take 1 tablet (20 mEq total) by mouth daily. 90 tablet 3   promethazine  (PHENERGAN ) 25 MG tablet TAKE 1 TABLET BY MOUTH EVERY 8 HOURS AS NEEDED FOR NAUSEA OR VOMITING. 20 tablet 0   rizatriptan  (MAXALT ) 10 MG tablet TAKE 1 TABLET BY MOUTH AS NEEDED FOR MIGRAINE. MAY REPEAT IN 2 HOURS IF NEEDED STRENGTH: 10 MG 10 tablet 0   rOPINIRole  (REQUIP ) 2 MG tablet TAKE 1/2 TO 1 TABLETS BY MOUTH AT BEDTIME. 90 tablet 3   rosuvastatin  (CRESTOR ) 20 MG tablet Take 1 tablet (20 mg total) by mouth daily. 90 tablet 1   thiamine  (VITAMIN B1) 100 MG tablet Take 1 tablet (100 mg total) by mouth daily. 30 tablet 0   No current facility-administered medications for this visit.    Allergies as of 05/25/2024 - Review Complete 02/19/2024  Allergen Reaction Noted   Jasmine oil Shortness Of Breath and Swelling 02/12/2013   Levaquin  [levofloxacin ] Shortness Of Breath, Itching, Swelling, and Rash 04/09/2019   Penicillins Anaphylaxis and Hives 02/12/2013   Klonopin  [clonazepam ]  11/27/2023   Aleve [naproxen sodium] Itching 02/12/2013   Chlorhexidine  Other (See Comments) 04/27/2021   Depo-provera [medroxyprogesterone acetate] Other (See Comments) 02/12/2013   Doxycycline  Other (See  Comments) 01/14/2018   Tramadol Other (See Comments) 02/12/2013    Family History  Problem Relation Age of Onset   Brain cancer Mother 37   Lung cancer Mother 72   Breast cancer Sister 35   Ovarian cancer Sister 31   Colon cancer Neg Hx    Colon polyps Neg Hx    Esophageal cancer Neg Hx    Rectal cancer Neg Hx    Stomach cancer Neg Hx     Social History   Socioeconomic History   Marital status: Married    Spouse name: Lemond   Number of children: 3   Years of education: 12th grade   Highest education level: Not on file  Occupational History  Occupation: Multimedia Programmer: [employer]  Tobacco Use   Smoking status: Former    Current packs/day: 1.00    Average packs/day: 1 pack/day for 34.0 years (34.0 ttl pk-yrs)    Types: Cigarettes   Smokeless tobacco: Never   Tobacco comments:    6 cigarettes a day   Vaping Use   Vaping status: Never Used  Substance and Sexual Activity   Alcohol use: Yes    Comment: everyday   Drug use: No   Sexual activity: Yes    Birth control/protection: Surgical  Other Topics Concern   Not on file  Social History Narrative   Lives with her husband.   Their 3 children live independently.   Social Drivers of Health   Tobacco Use: Medium Risk (05/01/2024)   [facility] from [facility]   Patient History    Smoking Tobacco Use: Former    Smokeless Tobacco Use: Never    Passive Exposure: Not on file  Financial Resource Strain: Low Risk (11/07/2023)   Overall Financial Resource Strain (CARDIA)    Difficulty of Paying Living Expenses: Not hard at all  Food Insecurity: No Food Insecurity (11/07/2023)   Epic    Worried About Radiation Protection Practitioner of Food in the Last Year: Never true    Ran Out of Food in the Last Year: Never true  Transportation Needs: No Transportation Needs (11/07/2023)   Epic    Lack of Transportation (Medical): No    Lack of Transportation (Non-Medical): No  Physical Activity: Inactive (11/07/2023)    Exercise Vital Sign    Days of Exercise per Week: 0 days    Minutes of Exercise per Session: 0 min  Stress: Stress Concern Present (11/07/2023)   [employer] of Occupational Health - Occupational Stress Questionnaire    Feeling of Stress: To some extent  Social Connections: Moderately Integrated (11/07/2023)   Social Connection and Isolation Panel    Frequency of Communication with Friends and Family: More than three times a week    Frequency of Social Gatherings with Friends and Family: Twice a week    Attends Religious Services: More than 4 times per year    Active Member of [employer] or Organizations: No    Attends Banker Meetings: Never    Marital Status: Married  Catering Manager Violence: Not At Risk (11/07/2023)   Epic    Fear of Current or Ex-Partner: No    Emotionally Abused: No    Physically Abused: No    Sexually Abused: No  Depression (PHQ2-9): Medium Risk (11/14/2023)   Depression (PHQ2-9)    PHQ-2 Score: 8  Alcohol Screen: Low Risk (11/07/2023)   Alcohol Screen    Last Alcohol Screening Score (AUDIT): 0  Housing: Unknown (11/12/2023)   Received from [facility]   Epic    Unable to Pay for Housing in the Last Year: Not on file    Number of Times Moved in the Last Year: Not on file    At any time in the past 12 months, were you homeless or living in a shelter (including now)?: No  Utilities: Not At Risk (11/07/2023)   Epic    Threatened with loss of utilities: No  Health Literacy: Adequate Health Literacy (11/07/2023)   B1300 Health Literacy    Frequency of need for help with medical instructions: Never    Review of Systems:    Constitutional: No weight loss, fever, chills, weakness or fatigue HEENT: Eyes: No  change in vision               Ears, Nose, Throat:  No change in hearing or congestion Skin: No rash or itching Cardiovascular: No chest pain, chest pressure or palpitations   Respiratory: No SOB or cough Gastrointestinal: See  HPI and otherwise negative Genitourinary: No dysuria or change in urinary frequency Neurological: No headache, dizziness or syncope Musculoskeletal: No new muscle or joint pain Hematologic: No bleeding or bruising Psychiatric: No history of depression or anxiety    Physical Exam:  Vital signs: There were no vitals taken for this visit.  Constitutional:   Pleasant Caucasian female appears to be in NAD, Well developed, Well nourished, alert and cooperative Head:  Normocephalic and atraumatic. Eyes:   PEERL, EOMI. No icterus. Conjunctiva pink. Ears:  Normal auditory acuity. Neck:  Supple Throat: Oral cavity and pharynx without inflammation, swelling or lesion.  Respiratory: Respirations even and unlabored. Lungs clear to auscultation bilaterally.   No wheezes, crackles, or rhonchi.  Cardiovascular: Normal S1, S2. No MRG. Regular rate and rhythm. No peripheral edema, cyanosis or pallor.  Gastrointestinal:  Soft, nondistended, nontender. No rebound or guarding. Normal bowel sounds. No appreciable masses or hepatomegaly. Rectal:  Not performed.  Msk:  Symmetrical without gross deformities. Without edema, no deformity or joint abnormality.  Neurologic:  Alert and  oriented x4;  grossly normal neurologically.  Skin:   Dry and intact without significant lesions or rashes. Psychiatric: Oriented to person, place and time. Demonstrates good judgement and reason without abnormal affect or behaviors.  RELEVANT LABS AND IMAGING: CBC    Component Value Date/Time   WBC 7.4 02/19/2024 1451   RBC 4.29 02/19/2024 1451   HGB 13.7 02/19/2024 1451   HGB 15.4 10/07/2020 0925   HCT 42.4 02/19/2024 1451   HCT 46.4 10/07/2020 0925   PLT 190 02/19/2024 1451   PLT 149 (L) 08/17/2019 1238   MCV 98.8 02/19/2024 1451   MCV 95 10/07/2020 0925   MCH 31.9 02/19/2024 1451   MCHC 32.3 02/19/2024 1451   RDW 12.4 02/19/2024 1451   RDW 12.5 10/07/2020 0925   LYMPHSABS 1.6 11/27/2023 1716   LYMPHSABS 5.0 (H)  10/07/2020 0925   MONOABS 0.9 11/27/2023 1716   EOSABS 370 02/19/2024 1451   EOSABS 0.1 10/07/2020 0925   BASOSABS 37 02/19/2024 1451   BASOSABS 0.0 10/07/2020 0925    CMP     Component Value Date/Time   NA 143 02/19/2024 1451   NA 140 10/07/2020 0925   K 4.3 02/19/2024 1451   CL 104 02/19/2024 1451   CO2 33 (H) 02/19/2024 1451   GLUCOSE 100 (H) 02/19/2024 1451   BUN 9 02/19/2024 1451   BUN 12 10/07/2020 0925   CREATININE 0.55 02/19/2024 1451   CALCIUM  8.9 02/19/2024 1451   PROT 6.7 02/19/2024 1451   PROT 7.0 10/07/2020 0925   ALBUMIN  2.9 (L) 11/27/2023 1716   ALBUMIN  4.3 10/07/2020 0925   AST 48 (H) 02/19/2024 1451   ALT 13 02/19/2024 1451   ALKPHOS 152 (H) 11/27/2023 1716   BILITOT 0.6 02/19/2024 1451   BILITOT 0.2 10/07/2020 0925   GFRNONAA >60 11/27/2023 1716   GFRAA 81 08/17/2019 1238    Assessment: 1. ***  Plan: 1. ***     Delon Failing, PA-C [facility] 05/22/2024, 3:52 PM  Cc: Kayla Jeoffrey RAMAN, FNP  "

## 2024-05-25 ENCOUNTER — Telehealth: Payer: Self-pay | Admitting: Physician Assistant

## 2024-05-25 ENCOUNTER — Ambulatory Visit: Admitting: Physician Assistant

## 2024-05-25 NOTE — Telephone Encounter (Signed)
 Good afternoon Vicki [name]  The following patient is scheduled to see you on today at 3pm and is sick with a fever. She is requesting the appointment be virtual. Are you willing to change the appointment. Please review and advise.   Thank you.

## 2024-05-27 ENCOUNTER — Ambulatory Visit: Payer: Self-pay

## 2024-05-27 NOTE — Telephone Encounter (Signed)
 FYI Only or Action Required?: Action required by provider: clinical question for provider.  Patient was last seen in primary care on 02/19/2024 by Kayla Jeoffrey RAMAN, FNP.  Called Nurse Triage reporting Sinusitis.  Symptoms began several days ago.  Interventions attempted: OTC medications: Tylenol  Sinus.  Symptoms are: unchanged.  Triage Disposition: Home Care  Patient/caregiver understands and will follow disposition?: Yes  Reason for Disposition  [1] Sinus congestion as part of a cold AND [2] present < 10 days  Answer Assessment - Initial Assessment Questions Spoke with patient's husband, Lemond, who states that patient has had symptoms of sinus infection for the last 4 days. He states that she is experiencing moderate pain and pressure around the eyes with green mucus and cough. She has been taking Tylenol  Sinus with no relief and symptoms have not improved at all. Home care advised. Patient would like to know if provider will prescribe antibiotic.   1. LOCATION: Where does it hurt?      Around eyes  2. ONSET: When did the sinus pain start?  (e.g., hours, days)      4 days ago  3. SEVERITY: How bad is the pain?   (Scale 0-10; or none, mild, moderate or severe)     Moderate  4. RECURRENT SYMPTOM: Have you ever had sinus problems before? If Yes, ask: When was the last time? and What happened that time?      Unknown  5. NASAL CONGESTION: Is the nose blocked? If Yes, ask: Can you open it or must you breathe through your mouth?     Yes, able to clear  6. NASAL DISCHARGE: Do you have discharge from your nose? If so ask, What color?     Yes, green  7. FEVER: Do you have a fever? If Yes, ask: What is it, how was it measured, and when did it start?      No  8. OTHER SYMPTOMS: Do you have any other symptoms? (e.g., sore throat, cough, earache, difficulty breathing)     Cough, headache  9. PREGNANCY: Is there any chance you are pregnant? When was your  last menstrual period?     NA  Protocols used: Sinus Pain or Congestion-A-AH  Copied from CRM #[id number]. Topic: Clinical - Red Word Triage >> May 27, 2024  4:09 PM Kevelyn M wrote: Red Word that prompted transfer to Nurse Triage: Patient's husband calling because patient has been battling a sinus infection the last few days would like to see if she can get a z-pak called. Experiencing green mucus and pain and pressure around her eyes.

## 2024-05-28 ENCOUNTER — Telehealth: Admitting: Family Medicine

## 2024-05-28 ENCOUNTER — Encounter: Payer: Self-pay | Admitting: Family Medicine

## 2024-05-28 DIAGNOSIS — J069 Acute upper respiratory infection, unspecified: Secondary | ICD-10-CM | POA: Diagnosis not present

## 2024-05-28 DIAGNOSIS — J449 Chronic obstructive pulmonary disease, unspecified: Secondary | ICD-10-CM | POA: Diagnosis not present

## 2024-05-28 MED ORDER — AZITHROMYCIN 250 MG PO TABS: ORAL_TABLET | ORAL | 0 refills | Status: AC

## 2024-05-28 NOTE — Progress Notes (Signed)
 "                    MyChart Video Visit    Virtual Visit via Video Note   This format is felt to be most appropriate for this patient at this time. Physical exam was limited by quality of the video and audio technology used for the visit.    Patient location: home Provider location: [facility] Persons involved in the visit: patient, provider  I discussed the limitations of evaluation and management by telemedicine and the availability of in person appointments. The patient expressed understanding and agreed to proceed. Unable to connect to video so visit was performed via phone call.  Patient: Vicki [name]   DOB: 07/09/1965   59 y.o. Female  MRN: [id number] Visit Date: 05/28/2024  Today's healthcare provider: Jeoffrey GORMAN Barrio, FNP   No chief complaint on file.  Past Medical History:  Diagnosis Date   Allergy    Anxiety    Arthritis    Asthma    CAP (community acquired pneumonia) 04/07/2019   Carpal tunnel syndrome    Cataract    COPD (chronic obstructive pulmonary disease) (HCC)    Depression    Diverticulitis    Emphysema of lung (HCC)    Essential hypertension, benign 07/14/2019   Hyperlipidemia    Oxygen deficiency    Oxygen dependent    on 5 L cont [place]   Past Surgical History:  Procedure Laterality Date   ABDOMINAL HYSTERECTOMY  2009   APPENDECTOMY     ARTHROSCOPIC REPAIR ACL Left 2011/2014   x 2   BIOPSY  01/21/2020   Procedure: BIOPSY;  Surgeon: Shila Gustav GAILS, MD;  Location: [facility];  Service: Endoscopy;;  EGD and COLON   BUNIONECTOMY  2006   both   CARPAL TUNNEL RELEASE Bilateral 10/28/2013   Procedure: BILATERAL CARPAL TUNNEL RELEASE;  Surgeon: Arley JONELLE Curia, MD;  Location: [facility];  Service: Orthopedics;  Laterality: Bilateral;   CHOLECYSTECTOMY  1994   CHONDROPLASTY Right 08/31/2020   Procedure: CHONDROPLASTY;  Surgeon: Shari Sieving, MD;  Location: [facility];  Service:  Orthopedics;  Laterality: Right;   COLONOSCOPY WITH PROPOFOL  N/A 01/21/2020   Procedure: COLONOSCOPY WITH PROPOFOL ;  Surgeon: Shila Gustav GAILS, MD;  Location: [facility];  Service: Endoscopy;  Laterality: N/A;   DILATION AND CURETTAGE OF UTERUS     ESOPHAGOGASTRODUODENOSCOPY (EGD) WITH PROPOFOL  N/A 01/21/2020   Procedure: ESOPHAGOGASTRODUODENOSCOPY (EGD) WITH PROPOFOL ;  Surgeon: Shila Gustav GAILS, MD;  Location: [facility];  Service: Endoscopy;  Laterality: N/A;   KNEE ARTHROSCOPY WITH ANTERIOR CRUCIATE LIGAMENT (ACL) REPAIR Right 08/31/2020   Procedure: RIGHT KNEE ARTHROSCOPY WITH ANTERIOR CRUCIATE LIGAMENT (ACL) REPAIR MEDIAL AND LATERAL  MENISECTOMY;  Surgeon: Shari Sieving, MD;  Location: [facility];  Service: Orthopedics;  Laterality: Right;  FEMORAL NERVE BLOCK   LAPAROTOMY N/A 10/10/2023   Procedure: EXPLORATORY LAPAROTOMY, PARTIAL COLECTOMY AND COLOSTOMY;  Surgeon: Teresa Lonni HERO, MD;  Location: [facility];  Service: General;  Laterality: N/A;  EXPLORATORY LAPAROTOMY , PARTIAL COLECTOMY AND COLOSTOMY   POLYPECTOMY  01/21/2020   Procedure: POLYPECTOMY;  Surgeon: Shila Gustav GAILS, MD;  Location: [facility];  Service: Endoscopy;;   TUBAL LIGATION  2004   Medications Ordered Prior to Encounter[1] Allergies[2]   Subjective:    HPI  Discussed the use of AI scribe software for clinical note transcription with the patient, who gave verbal consent to proceed.  History  of Present Illness Vicki [name] is a 59 year old female with COPD who presents with respiratory symptoms and body aches.  Approximately nine days ago, she began experiencing a scratchy throat and hoarseness, which progressed to significant throat pain and a severe cough the following day. She describes the cough as 'phenomenal' but notes some improvement recently. No flu or COVID swabs have been performed.  She experiences body aches but no fevers. No sinus drainage or pressure, but she  mentions frequent headaches. She continues to experience a cough and reports mild shortness of breath; she uses supplemental oxygen. Her breathing was more difficult a few days ago but has since improved slightly.  She has not taken any antibiotics since her colonoscopy on May 27th. She is allergic to several antibiotics, including Augmentin, amoxicillin, doxycycline , levofloxacin , and clindamycin , which cause adverse reactions such as hives.  She reports not having eaten much in the past nine days, except for a small amount of macaroni and cheese the previous night. She feels dehydrated, noting 'my lips are all fried,' and has been trying to stay hydrated with ice water and Gatorade.   Review of Systems  All other systems reviewed and are negative.        Objective:    There were no vitals taken for this visit.      Physical Exam HENT:     Mouth/Throat:     Comments: Hoarse and coughing Neurological:     Mental Status: She is alert and oriented to person, place, and time.  Psychiatric:        Mood and Affect: Mood normal.        Behavior: Behavior normal.        Thought Content: Thought content normal.        Judgment: Judgment normal.         Assessment & Plan:    Problem List Items Addressed This Visit       Respiratory   Upper respiratory tract infection - Primary   Relevant Medications   azithromycin  (ZITHROMAX ) 250 MG tablet    Assessment and Plan Assessment & Plan Acute upper respiratory infection Symptoms suggest viral etiology. Allergic to multiple antibiotics. Z-Pak considered due to COPD concerns. - Prescribed Z-Pak (azithromycin ). - Advised Mucinex  for chest congestion. - Recommended Tylenol  for pain and fever. - Suggested Flonase  for nasal congestion. - Encouraged increased fluid intake.  Chronic obstructive pulmonary disease (COPD) Recent exacerbation symptoms. Z-Pak prescribed to mitigate risk of exacerbation. - Continue oxygen therapy as  needed. - Monitor respiratory symptoms and seek evaluation if symptoms worsen.    Meds ordered this encounter  Medications   azithromycin  (ZITHROMAX ) 250 MG tablet    Sig: Take 2 tablets on day 1, then 1 tablet daily on days 2 through 5    Dispense:  6 tablet    Refill:  0    Supervising Provider:   DUANNE LOWERS T [[id number]]     No follow-ups on file.     I discussed the assessment and treatment plan with the patient. The patient was provided an opportunity to ask questions and all were answered. The patient agreed with the plan and demonstrated an understanding of the instructions.   The patient was advised to call back or seek an in-person evaluation if the symptoms worsen or if the condition fails to improve as anticipated.  I provided 11 minutes of non-face-to-face time during this encounter.  Jeoffrey GORMAN Barrio, FNP [facility]        [  1]  Current Outpatient Medications on File Prior to Visit  Medication Sig Dispense Refill   acetaminophen  (TYLENOL ) 500 MG tablet Take 1 tablet (500 mg total) by mouth every 8 (eight) hours as needed for moderate pain (pain score 4-6) or mild pain (pain score 1-3). 30 tablet 0   albuterol  (VENTOLIN  HFA) 108 (90 Base) MCG/ACT inhaler INHALE 2 PUFFS INTO THE LUNGS TWICE A DAY 18 each 11   ASPIRIN  LOW DOSE 81 MG EC tablet TAKE 1 TABLET BY MOUTH 2 TIMES DAILY. 180 tablet 1   calcium  carbonate (TUMS) 500 MG chewable tablet Chew 1 tablet (200 mg of elemental calcium  total) by mouth daily. 30 tablet 0   Cholecalciferol  1.25 MG (50000 UT) TABS Take 1 tablet by mouth once a week. For 8 weeks and then stop.  Return to office for lab draw. 8 tablet 0   citalopram  (CELEXA ) 40 MG tablet TAKE 1 TABLET BY MOUTH EVERY DAY 90 tablet 1   EPINEPHrine  (EPIPEN  2-PAK) 0.3 mg/0.3 mL IJ SOAJ injection Inject 0.3 mg into the muscle as needed for anaphylaxis. 1 each 3   Fluticasone -Umeclidin-Vilant (TRELEGY ELLIPTA ) 200-62.5-25 MCG/ACT AEPB  Take 1 puff by mouth daily. 60 each 11   folic acid  (FOLVITE ) 1 MG tablet Take 1 tablet (1 mg total) by mouth daily. 30 tablet 0   LORazepam  (ATIVAN ) 0.5 MG tablet Take 1 tablet (0.5 mg total) by mouth 2 (two) times daily as needed for anxiety. 30 tablet 0   Magnesium  Chloride 64 MG TABS Take 2 tablets by mouth in the morning, at noon, and at bedtime. 30 tablet 0   midodrine  (PROAMATINE ) 5 MG tablet Take 1 tablet (5 mg total) by mouth 2 (two) times daily with a meal. 180 tablet 0   nicotine  (NICODERM CQ  - DOSED IN MG/24 HOURS) 14 mg/24hr patch Place 1 patch (14 mg total) onto the skin daily. 28 patch 0   omeprazole  (PRILOSEC) 40 MG capsule Take 1 capsule (40 mg total) by mouth 2 (two) times daily. Take 30 mins. Prior to breakfast and supper 60 capsule 11   ondansetron  (ZOFRAN ) 8 MG tablet Take 1 tablet (8 mg total) by mouth every 8 (eight) hours as needed for nausea or vomiting. 30 tablet 1   oxyCODONE  (ROXICODONE ) 5 MG immediate release tablet Take 1 tablet (5 mg total) by mouth every 4 (four) hours as needed for severe pain (pain score 7-10). (Patient not taking: Reported on 02/19/2024) 12 tablet 0   potassium chloride  SA (KLOR-CON  M) 20 MEQ tablet Take 1 tablet (20 mEq total) by mouth daily. 90 tablet 3   promethazine  (PHENERGAN ) 25 MG tablet TAKE 1 TABLET BY MOUTH EVERY 8 HOURS AS NEEDED FOR NAUSEA OR VOMITING. 20 tablet 0   rizatriptan  (MAXALT ) 10 MG tablet TAKE 1 TABLET BY MOUTH AS NEEDED FOR MIGRAINE. MAY REPEAT IN 2 HOURS IF NEEDED STRENGTH: 10 MG 10 tablet 0   rOPINIRole  (REQUIP ) 2 MG tablet TAKE 1/2 TO 1 TABLETS BY MOUTH AT BEDTIME. 90 tablet 3   rosuvastatin  (CRESTOR ) 20 MG tablet Take 1 tablet (20 mg total) by mouth daily. 90 tablet 1   thiamine  (VITAMIN B1) 100 MG tablet Take 1 tablet (100 mg total) by mouth daily. 30 tablet 0   No current facility-administered medications on file prior to visit.  [2]  Allergies Allergen Reactions   Jasmine Oil Shortness Of Breath and Swelling    Levaquin  [Levofloxacin ] Shortness Of Breath, Itching, Swelling and Rash   Penicillins Anaphylaxis and  Hives    Tolerated ceftriaxone    Klonopin  [Clonazepam ]     insomnia   Aleve [Naproxen Sodium] Itching   Chlorhexidine  Other (See Comments)    Unknown reaction   Depo-Provera [Medroxyprogesterone Acetate] Other (See Comments)    Severe acne   Doxycycline  Other (See Comments)    Raises blood pressure    Tramadol Other (See Comments)    Kept her up all night   "

## 2024-05-28 NOTE — Progress Notes (Signed)
 "                    MyChart Video Visit    Virtual Visit via Video Note   This format is felt to be most appropriate for this patient at this time. Physical exam was limited by quality of the video and audio technology used for the visit.    Patient location: home Provider location: [place] [facility] Persons involved in the visit: patient, provider  I discussed the limitations of evaluation and management by telemedicine and the availability of in person appointments. The patient expressed understanding and agreed to proceed. Unable to connect to video so visit was performed via phone call.  Patient: Vicki [name]   DOB: 12/21/1965   59 y.o. Female  MRN: [id number] Visit Date: 05/28/2024  Today's healthcare provider: Jeoffrey GORMAN Barrio, FNP   No chief complaint on file.  Past Medical History:  Diagnosis Date   Allergy    Anxiety    Arthritis    Asthma    CAP (community acquired pneumonia) 04/07/2019   Carpal tunnel syndrome    Cataract    COPD (chronic obstructive pulmonary disease) (HCC)    Depression    Diverticulitis    Emphysema of lung (HCC)    Essential hypertension, benign 07/14/2019   Hyperlipidemia    Oxygen deficiency    Oxygen dependent    on 5 L cont [place]   Past Surgical History:  Procedure Laterality Date   ABDOMINAL HYSTERECTOMY  2009   APPENDECTOMY     ARTHROSCOPIC REPAIR ACL Left 2011/2014   x 2   BIOPSY  01/21/2020   Procedure: BIOPSY;  Surgeon: Shila Gustav GAILS, MD;  Location: [facility];  Service: Endoscopy;;  EGD and COLON   BUNIONECTOMY  2006   both   CARPAL TUNNEL RELEASE Bilateral 10/28/2013   Procedure: BILATERAL CARPAL TUNNEL RELEASE;  Surgeon: Arley JONELLE Curia, MD;  Location: [facility];  Service: Orthopedics;  Laterality: Bilateral;   CHOLECYSTECTOMY  1994   CHONDROPLASTY Right 08/31/2020   Procedure: CHONDROPLASTY;  Surgeon: Shari Sieving, MD;  Location: [facility];  Service:  Orthopedics;  Laterality: Right;   COLONOSCOPY WITH PROPOFOL  N/A 01/21/2020   Procedure: COLONOSCOPY WITH PROPOFOL ;  Surgeon: Shila Gustav GAILS, MD;  Location: [facility];  Service: Endoscopy;  Laterality: N/A;   DILATION AND CURETTAGE OF UTERUS     ESOPHAGOGASTRODUODENOSCOPY (EGD) WITH PROPOFOL  N/A 01/21/2020   Procedure: ESOPHAGOGASTRODUODENOSCOPY (EGD) WITH PROPOFOL ;  Surgeon: Shila Gustav GAILS, MD;  Location: [facility];  Service: Endoscopy;  Laterality: N/A;   KNEE ARTHROSCOPY WITH ANTERIOR CRUCIATE LIGAMENT (ACL) REPAIR Right 08/31/2020   Procedure: RIGHT KNEE ARTHROSCOPY WITH ANTERIOR CRUCIATE LIGAMENT (ACL) REPAIR MEDIAL AND LATERAL  MENISECTOMY;  Surgeon: Shari Sieving, MD;  Location: [facility];  Service: Orthopedics;  Laterality: Right;  FEMORAL NERVE BLOCK   LAPAROTOMY N/A 10/10/2023   Procedure: EXPLORATORY LAPAROTOMY, PARTIAL COLECTOMY AND COLOSTOMY;  Surgeon: Teresa Lonni HERO, MD;  Location: [facility];  Service: General;  Laterality: N/A;  EXPLORATORY LAPAROTOMY , PARTIAL COLECTOMY AND COLOSTOMY   POLYPECTOMY  01/21/2020   Procedure: POLYPECTOMY;  Surgeon: Shila Gustav GAILS, MD;  Location: [facility];  Service: Endoscopy;;   TUBAL LIGATION  2004   Medications Ordered Prior to Encounter[1] Allergies[2]   Subjective:    HPI  Discussed the use of AI scribe software for clinical note transcription with the patient, who gave verbal consent to proceed.  History  of Present Illness Vicki [name] is a 59 year old female with COPD who presents with respiratory symptoms and body aches.  Approximately nine days ago, she began experiencing a scratchy throat and hoarseness, which progressed to significant throat pain and a severe cough the following day. She describes the cough as 'phenomenal' but notes some improvement recently. No flu or COVID swabs have been performed.  She experiences body aches but no fevers. No sinus drainage or pressure, but she  mentions frequent headaches. She continues to experience a cough and reports mild shortness of breath; she uses supplemental oxygen. Her breathing was more difficult a few days ago but has since improved slightly.  She has not taken any antibiotics since her colonoscopy on May 27th. She is allergic to several antibiotics, including Augmentin, amoxicillin, doxycycline , levofloxacin , and clindamycin , which cause adverse reactions such as hives.  She reports not having eaten much in the past nine days, except for a small amount of macaroni and cheese the previous night. She feels dehydrated, noting 'my lips are all fried,' and has been trying to stay hydrated with ice water and Gatorade.   Review of Systems  All other systems reviewed and are negative.        Objective:    There were no vitals taken for this visit.      Physical Exam HENT:     Mouth/Throat:     Comments: Hoarse and coughing Neurological:     Mental Status: She is alert and oriented to person, place, and time.  Psychiatric:        Mood and Affect: Mood normal.        Behavior: Behavior normal.        Thought Content: Thought content normal.        Judgment: Judgment normal.         Assessment & Plan:    Problem List Items Addressed This Visit       Respiratory   Upper respiratory tract infection - Primary   Other Visit Diagnoses       Chronic obstructive pulmonary disease, unspecified COPD type (HCC)            Assessment and Plan Assessment & Plan Acute upper respiratory infection Symptoms suggest viral etiology. Allergic to multiple antibiotics. Z-Pak considered due to COPD concerns. - Prescribed Z-Pak (azithromycin ). - Advised Mucinex  for chest congestion. - Recommended Tylenol  for pain and fever. - Suggested Flonase  for nasal congestion. - Encouraged increased fluid intake.  Chronic obstructive pulmonary disease (COPD) Recent exacerbation symptoms. Z-Pak prescribed to mitigate risk of  exacerbation. - Continue oxygen therapy as needed. - Monitor respiratory symptoms and seek evaluation if symptoms worsen.    No orders of the defined types were placed in this encounter.    No follow-ups on file.     I discussed the assessment and treatment plan with the patient. The patient was provided an opportunity to ask questions and all were answered. The patient agreed with the plan and demonstrated an understanding of the instructions.   The patient was advised to call back or seek an in-person evaluation if the symptoms worsen or if the condition fails to improve as anticipated.  I provided 11 minutes of non-face-to-face time during this encounter.  Jeoffrey GORMAN Barrio, FNP [facility]         [1]  Current Outpatient Medications on File Prior to Visit  Medication Sig Dispense Refill   acetaminophen  (TYLENOL ) 500 MG tablet  Take 1 tablet (500 mg total) by mouth every 8 (eight) hours as needed for moderate pain (pain score 4-6) or mild pain (pain score 1-3). 30 tablet 0   albuterol  (VENTOLIN  HFA) 108 (90 Base) MCG/ACT inhaler INHALE 2 PUFFS INTO THE LUNGS TWICE A DAY 18 each 11   ASPIRIN  LOW DOSE 81 MG EC tablet TAKE 1 TABLET BY MOUTH 2 TIMES DAILY. 180 tablet 1   azithromycin  (ZITHROMAX ) 250 MG tablet Take 2 tablets on day 1, then 1 tablet daily on days 2 through 5 6 tablet 0   calcium  carbonate (TUMS) 500 MG chewable tablet Chew 1 tablet (200 mg of elemental calcium  total) by mouth daily. 30 tablet 0   Cholecalciferol  1.25 MG (50000 UT) TABS Take 1 tablet by mouth once a week. For 8 weeks and then stop.  Return to office for lab draw. 8 tablet 0   citalopram  (CELEXA ) 40 MG tablet TAKE 1 TABLET BY MOUTH EVERY DAY 90 tablet 1   EPINEPHrine  (EPIPEN  2-PAK) 0.3 mg/0.3 mL IJ SOAJ injection Inject 0.3 mg into the muscle as needed for anaphylaxis. 1 each 3   Fluticasone -Umeclidin-Vilant (TRELEGY ELLIPTA ) 200-62.5-25 MCG/ACT AEPB Take 1 puff by mouth daily.  60 each 11   folic acid  (FOLVITE ) 1 MG tablet Take 1 tablet (1 mg total) by mouth daily. 30 tablet 0   LORazepam  (ATIVAN ) 0.5 MG tablet Take 1 tablet (0.5 mg total) by mouth 2 (two) times daily as needed for anxiety. 30 tablet 0   Magnesium  Chloride 64 MG TABS Take 2 tablets by mouth in the morning, at noon, and at bedtime. 30 tablet 0   midodrine  (PROAMATINE ) 5 MG tablet Take 1 tablet (5 mg total) by mouth 2 (two) times daily with a meal. 180 tablet 0   nicotine  (NICODERM CQ  - DOSED IN MG/24 HOURS) 14 mg/24hr patch Place 1 patch (14 mg total) onto the skin daily. 28 patch 0   omeprazole  (PRILOSEC) 40 MG capsule Take 1 capsule (40 mg total) by mouth 2 (two) times daily. Take 30 mins. Prior to breakfast and supper 60 capsule 11   ondansetron  (ZOFRAN ) 8 MG tablet Take 1 tablet (8 mg total) by mouth every 8 (eight) hours as needed for nausea or vomiting. 30 tablet 1   oxyCODONE  (ROXICODONE ) 5 MG immediate release tablet Take 1 tablet (5 mg total) by mouth every 4 (four) hours as needed for severe pain (pain score 7-10). (Patient not taking: Reported on 02/19/2024) 12 tablet 0   potassium chloride  SA (KLOR-CON  M) 20 MEQ tablet Take 1 tablet (20 mEq total) by mouth daily. 90 tablet 3   promethazine  (PHENERGAN ) 25 MG tablet TAKE 1 TABLET BY MOUTH EVERY 8 HOURS AS NEEDED FOR NAUSEA OR VOMITING. 20 tablet 0   rizatriptan  (MAXALT ) 10 MG tablet TAKE 1 TABLET BY MOUTH AS NEEDED FOR MIGRAINE. MAY REPEAT IN 2 HOURS IF NEEDED STRENGTH: 10 MG 10 tablet 0   rOPINIRole  (REQUIP ) 2 MG tablet TAKE 1/2 TO 1 TABLETS BY MOUTH AT BEDTIME. 90 tablet 3   rosuvastatin  (CRESTOR ) 20 MG tablet Take 1 tablet (20 mg total) by mouth daily. 90 tablet 1   thiamine  (VITAMIN B1) 100 MG tablet Take 1 tablet (100 mg total) by mouth daily. 30 tablet 0   No current facility-administered medications on file prior to visit.  [2]  Allergies Allergen Reactions   Jasmine Oil Shortness Of Breath and Swelling   Levaquin  [Levofloxacin ]  Shortness Of Breath, Itching, Swelling and Rash  Penicillins Anaphylaxis and Hives    Tolerated ceftriaxone    Klonopin  [Clonazepam ]     insomnia   Aleve [Naproxen Sodium] Itching   Chlorhexidine  Other (See Comments)    Unknown reaction   Depo-Provera [Medroxyprogesterone Acetate] Other (See Comments)    Severe acne   Doxycycline  Other (See Comments)    Raises blood pressure    Tramadol Other (See Comments)    Kept her up all night   "

## 2024-06-02 ENCOUNTER — Encounter: Payer: Self-pay | Admitting: Family Medicine

## 2024-06-02 ENCOUNTER — Ambulatory Visit: Admitting: Family Medicine

## 2024-06-02 VITALS — BP 82/57 | HR 120 | Temp 98.8°F | Ht 63.0 in | Wt 145.0 lb

## 2024-06-02 DIAGNOSIS — Z939 Artificial opening status, unspecified: Secondary | ICD-10-CM | POA: Diagnosis not present

## 2024-06-02 DIAGNOSIS — R110 Nausea: Secondary | ICD-10-CM | POA: Diagnosis not present

## 2024-06-02 DIAGNOSIS — R000 Tachycardia, unspecified: Secondary | ICD-10-CM

## 2024-06-02 DIAGNOSIS — I959 Hypotension, unspecified: Secondary | ICD-10-CM | POA: Diagnosis not present

## 2024-06-02 NOTE — Progress Notes (Signed)
 "  Acute Office Visit  Patient ID: Vicki [name], female    DOB: [birth year], 59 y.o.   MRN: [id number]  PCP: Kayla Jeoffrey RAMAN, FNP  Chief Complaint  Patient presents with   Medical Management of Chronic Issues    Pt reported passing out last night. Feeling nauseated all the time not able to take meds due to nauseous feeling      Subjective:     HPI Ms Zelaya is here today for chronic condition management.   Anxiety: well controlled on Celexa  40mg  daily and PRN Ativan    COPD: home O2 4L, followed by Pulmonology Dr Valorie, Trelegy and PRN Albuterol , has restarted smoking, nicotine  patches ordered   HLD: bASA, on Crestor  10mg  daily, currently inactive   Nicotine  Dependence: has restarted smoking unfortunately, nicotine  patches ordered, has not completed lung CT   Liver lesion: incidental finding on CT measuring 1.7 x 3cm, MRI ordered but not yet completed, overdue for GI follow up   Elevated TSH: has not started Synthroid, repeat TSH panel today, euthyroid on exam    Anemia: repeat CBC, not taking iron supplement   S/P Hartman's procedure: managing well with ostomy   Discussed the use of AI scribe software for clinical note transcription with the patient, who gave verbal consent to proceed.  History of Present Illness Vicki [name] is a 59 year old female who presents for chronic condition management with low blood pressure, high heart rate, and syncope.  She experiences weakness and blurred vision. Despite taking midodrine  for hypotension, she continues to feel unwell. Persistent nausea is managed with Zofran  and Phenergan , though relief is inadequate.  She has a colostomy. She changed the colostomy bag yesterday and noted stool presence, but now experiences abdominal pain with no air or stool passing. Despite limited food intake, she has diarrhea usually.  Her medical history includes surgery for nausea and abdominal pain, resulting in a colostomy. Despite  surgery, significant nausea persists. Phenergan  and Zofran  provide some relief but do not fully resolve symptoms.  She has a history of fatty liver with findings suggestive of cirrhosis and osteopenia. She experiences stomach pain and nausea, which prompted a CT scan ordered by her surgeon. She also has diabetes and cholesterol issues, which are managed with regular follow-ups.  No fevers, though she feels feverish at times. She keeps down a Z-Pak and drinks water daily but has not been eating regularly.   Review of Systems  All other systems reviewed and are negative.   Past Medical History:  Diagnosis Date   Allergy    Anxiety    Arthritis    Asthma    CAP (community acquired pneumonia) 04/07/2019   Carpal tunnel syndrome    Cataract    COPD (chronic obstructive pulmonary disease) (HCC)    Depression    Diverticulitis    Emphysema of lung (HCC)    Essential hypertension, benign 07/14/2019   Hyperlipidemia    Oxygen deficiency    Oxygen dependent    on 5 L cont [place]    Past Surgical History:  Procedure Laterality Date   ABDOMINAL HYSTERECTOMY  2009   APPENDECTOMY     ARTHROSCOPIC REPAIR ACL Left 2011/2014   x 2   BIOPSY  01/21/2020   Procedure: BIOPSY;  Surgeon: Shila Gustav GAILS, MD;  Location: [facility];  Service: Endoscopy;;  EGD and COLON   BUNIONECTOMY  2006   both   CARPAL TUNNEL RELEASE Bilateral 10/28/2013   Procedure: BILATERAL CARPAL TUNNEL  RELEASE;  Surgeon: Arley JONELLE Curia, MD;  Location: [facility];  Service: Orthopedics;  Laterality: Bilateral;   CHOLECYSTECTOMY  1994   CHONDROPLASTY Right 08/31/2020   Procedure: CHONDROPLASTY;  Surgeon: Shari Sieving, MD;  Location: [facility];  Service: Orthopedics;  Laterality: Right;   COLONOSCOPY WITH PROPOFOL  N/A 01/21/2020   Procedure: COLONOSCOPY WITH PROPOFOL ;  Surgeon: Shila Gustav GAILS, MD;  Location: [facility];  Service: Endoscopy;  Laterality: N/A;   DILATION AND CURETTAGE OF  UTERUS     ESOPHAGOGASTRODUODENOSCOPY (EGD) WITH PROPOFOL  N/A 01/21/2020   Procedure: ESOPHAGOGASTRODUODENOSCOPY (EGD) WITH PROPOFOL ;  Surgeon: Shila Gustav GAILS, MD;  Location: [facility];  Service: Endoscopy;  Laterality: N/A;   KNEE ARTHROSCOPY WITH ANTERIOR CRUCIATE LIGAMENT (ACL) REPAIR Right 08/31/2020   Procedure: RIGHT KNEE ARTHROSCOPY WITH ANTERIOR CRUCIATE LIGAMENT (ACL) REPAIR MEDIAL AND LATERAL  MENISECTOMY;  Surgeon: Shari Sieving, MD;  Location:  [facility];  Service: Orthopedics;  Laterality: Right;  FEMORAL NERVE BLOCK   LAPAROTOMY N/A 10/10/2023   Procedure: EXPLORATORY LAPAROTOMY, PARTIAL COLECTOMY AND COLOSTOMY;  Surgeon: Teresa Lonni HERO, MD;  Location: [facility];  Service: General;  Laterality: N/A;  EXPLORATORY LAPAROTOMY , PARTIAL COLECTOMY AND COLOSTOMY   POLYPECTOMY  01/21/2020   Procedure: POLYPECTOMY;  Surgeon: Shila Gustav GAILS, MD;  Location: [facility];  Service: Endoscopy;;   TUBAL LIGATION  2004    Outpatient Medications Prior to Visit  Medication Sig Dispense Refill   promethazine  (PHENERGAN ) 25 MG tablet TAKE 1 TABLET BY MOUTH EVERY 8 HOURS AS NEEDED FOR NAUSEA OR VOMITING. 20 tablet 0   rOPINIRole  (REQUIP ) 2 MG tablet TAKE 1/2 TO 1 TABLETS BY MOUTH AT BEDTIME. 90 tablet 3   acetaminophen  (TYLENOL ) 500 MG tablet Take 1 tablet (500 mg total) by mouth every 8 (eight) hours as needed for moderate pain (pain score 4-6) or mild pain (pain score 1-3). 30 tablet 0   albuterol  (VENTOLIN  HFA) 108 (90 Base) MCG/ACT inhaler INHALE 2 PUFFS INTO THE LUNGS TWICE A DAY 18 each 11   ASPIRIN  LOW DOSE 81 MG EC tablet TAKE 1 TABLET BY MOUTH 2 TIMES DAILY. 180 tablet 1   azithromycin  (ZITHROMAX ) 250 MG tablet Take 2 tablets on day 1, then 1 tablet daily on days 2 through 5 6 tablet 0   calcium  carbonate (TUMS) 500 MG chewable tablet Chew 1 tablet (200 mg of elemental calcium  total) by mouth daily. 30 tablet 0   Cholecalciferol  1.25 MG (50000 UT) TABS Take 1 tablet  by mouth once a week. For 8 weeks and then stop.  Return to office for lab draw. 8 tablet 0   citalopram  (CELEXA ) 40 MG tablet TAKE 1 TABLET BY MOUTH EVERY DAY 90 tablet 1   EPINEPHrine  (EPIPEN  2-PAK) 0.3 mg/0.3 mL IJ SOAJ injection Inject 0.3 mg into the muscle as needed for anaphylaxis. 1 each 3   Fluticasone -Umeclidin-Vilant (TRELEGY ELLIPTA ) 200-62.5-25 MCG/ACT AEPB Take 1 puff by mouth daily. 60 each 11   folic acid  (FOLVITE ) 1 MG tablet Take 1 tablet (1 mg total) by mouth daily. 30 tablet 0   LORazepam  (ATIVAN ) 0.5 MG tablet Take 1 tablet (0.5 mg total) by mouth 2 (two) times daily as needed for anxiety. 30 tablet 0   Magnesium  Chloride 64 MG TABS Take 2 tablets by mouth in the morning, at noon, and at bedtime. 30 tablet 0   midodrine  (PROAMATINE ) 5 MG tablet Take 1 tablet (5 mg total) by mouth 2 (two) times daily with a  meal. 180 tablet 0   nicotine  (NICODERM CQ  - DOSED IN MG/24 HOURS) 14 mg/24hr patch Place 1 patch (14 mg total) onto the skin daily. 28 patch 0   omeprazole  (PRILOSEC) 40 MG capsule Take 1 capsule (40 mg total) by mouth 2 (two) times daily. Take 30 mins. Prior to breakfast and supper 60 capsule 11   ondansetron  (ZOFRAN ) 8 MG tablet Take 1 tablet (8 mg total) by mouth every 8 (eight) hours as needed for nausea or vomiting. 30 tablet 1   oxyCODONE  (ROXICODONE ) 5 MG immediate release tablet Take 1 tablet (5 mg total) by mouth every 4 (four) hours as needed for severe pain (pain score 7-10). (Patient not taking: Reported on 02/19/2024) 12 tablet 0   potassium chloride  SA (KLOR-CON  M) 20 MEQ tablet Take 1 tablet (20 mEq total) by mouth daily. 90 tablet 3   rizatriptan  (MAXALT ) 10 MG tablet TAKE 1 TABLET BY MOUTH AS NEEDED FOR MIGRAINE. MAY REPEAT IN 2 HOURS IF NEEDED STRENGTH: 10 MG 10 tablet 0   rosuvastatin  (CRESTOR ) 20 MG tablet Take 1 tablet (20 mg total) by mouth daily. 90 tablet 1   thiamine  (VITAMIN B1) 100 MG tablet Take 1 tablet (100 mg total) by mouth daily. 30 tablet 0    No facility-administered medications prior to visit.    Allergies[1]     Objective:    BP (!) 82/57   Pulse (!) 120   Temp 98.8 F (37.1 C)   Ht 5' 3 (1.6 m)   Wt 145 lb (65.8 kg) Comment: pt. stated. wheelchair bound at time of appt.  SpO2 93%   BMI 25.69 kg/m  BP Readings from Last 3 Encounters:  06/02/24 (!) 82/57  02/19/24 122/78  11/27/23 110/80   Wt Readings from Last 3 Encounters:  06/02/24 145 lb (65.8 kg)  02/19/24 138 lb 6.4 oz (62.8 kg)  11/27/23 148 lb (67.1 kg)      Physical Exam Vitals and nursing note reviewed.  Constitutional:      Appearance: Normal appearance. She is normal weight. She is ill-appearing.  HENT:     Head: Normocephalic and atraumatic.  Cardiovascular:     Rate and Rhythm: Regular rhythm. Tachycardia present.     Pulses: Normal pulses.     Heart sounds: Normal heart sounds.  Pulmonary:     Effort: Pulmonary effort is normal.     Breath sounds: Normal breath sounds.  Skin:    General: Skin is warm and dry.     Coloration: Skin is pale.  Neurological:     General: No focal deficit present.     Mental Status: She is alert and oriented to person, place, and time. Mental status is at baseline.  Psychiatric:        Mood and Affect: Mood normal.        Behavior: Behavior normal.        Thought Content: Thought content normal.        Judgment: Judgment normal.       No results found for any visits on 06/02/24.     Assessment & Plan:   Problem List Items Addressed This Visit       Cardiovascular and Mediastinum   Hypotension - Primary     Other   History of creation of ostomy (HCC)   Tachycardia   Chronic nausea    Assessment and Plan Assessment & Plan Syncope with hypotension and tachycardia Acute syncope with hypotension and tachycardia. Differential includes dehydration, malnutrition,  infection, or sepsis. - Referred to emergency room for evaluation and management. - Recommended IV fluids and possible IV  antibiotics. - Sinus tachycardia on EKG  Colostomy with impaired function and abdominal pain Colostomy dysfunction with abdominal pain and altered output. - Referred to emergency room for further evaluation.  Chronic nausea Persistent nausea despite antiemetics, possibly related to gastrointestinal issues or medication. - Consult with GI    No orders of the defined types were placed in this encounter.   Return for chronic follow-up with labs 1 week prior.  Jeoffrey GORMAN Barrio, FNP [facility]      [1]  Allergies Allergen Reactions   Jasmine Oil Shortness Of Breath and Swelling   Levaquin  [Levofloxacin ] Shortness Of Breath, Itching, Swelling and Rash   Penicillins Anaphylaxis and Hives    Tolerated ceftriaxone    Klonopin  [Clonazepam ]     insomnia   Aleve [Naproxen Sodium] Itching   Chlorhexidine  Other (See Comments)    Unknown reaction   Depo-Provera [Medroxyprogesterone Acetate] Other (See Comments)    Severe acne   Doxycycline  Other (See Comments)    Raises blood pressure    Tramadol Other (See Comments)    Kept her up all night   "

## 2024-06-05 ENCOUNTER — Encounter: Payer: Self-pay | Admitting: Family Medicine

## 2024-06-16 ENCOUNTER — Ambulatory Visit: Admitting: Physician Assistant

## 2024-07-21 ENCOUNTER — Encounter

## 2024-11-12 ENCOUNTER — Encounter
